# Patient Record
Sex: Female | Born: 1945 | Race: Black or African American | Hispanic: No | Smoking: Never smoker
Health system: Southern US, Community
[De-identification: ages and names within clinical notes are randomized; demographics above are authoritative.]

## PROBLEM LIST (undated history)

## (undated) DIAGNOSIS — I080 Rheumatic disorders of both mitral and aortic valves: Secondary | ICD-10-CM

## (undated) DIAGNOSIS — N186 End stage renal disease: Secondary | ICD-10-CM

## (undated) DIAGNOSIS — I5032 Chronic diastolic (congestive) heart failure: Secondary | ICD-10-CM

## (undated) DIAGNOSIS — I639 Cerebral infarction, unspecified: Secondary | ICD-10-CM

## (undated) DIAGNOSIS — I471 Supraventricular tachycardia: Secondary | ICD-10-CM

## (undated) DIAGNOSIS — E785 Hyperlipidemia, unspecified: Secondary | ICD-10-CM

## (undated) DIAGNOSIS — I119 Hypertensive heart disease without heart failure: Secondary | ICD-10-CM

## (undated) DIAGNOSIS — Z992 Dependence on renal dialysis: Secondary | ICD-10-CM

## (undated) DIAGNOSIS — E119 Type 2 diabetes mellitus without complications: Secondary | ICD-10-CM

## (undated) DIAGNOSIS — D132 Benign neoplasm of duodenum: Secondary | ICD-10-CM

## (undated) DIAGNOSIS — K432 Incisional hernia without obstruction or gangrene: Secondary | ICD-10-CM

## (undated) DIAGNOSIS — J9601 Acute respiratory failure with hypoxia: Secondary | ICD-10-CM

## (undated) DIAGNOSIS — J398 Other specified diseases of upper respiratory tract: Secondary | ICD-10-CM

## (undated) DIAGNOSIS — E669 Obesity, unspecified: Secondary | ICD-10-CM

## (undated) DIAGNOSIS — I2510 Atherosclerotic heart disease of native coronary artery without angina pectoris: Secondary | ICD-10-CM

## (undated) DIAGNOSIS — Z933 Colostomy status: Secondary | ICD-10-CM

## (undated) DIAGNOSIS — D649 Anemia, unspecified: Secondary | ICD-10-CM

## (undated) DIAGNOSIS — K219 Gastro-esophageal reflux disease without esophagitis: Secondary | ICD-10-CM

## (undated) DIAGNOSIS — I447 Left bundle-branch block, unspecified: Secondary | ICD-10-CM

## (undated) DIAGNOSIS — H409 Unspecified glaucoma: Secondary | ICD-10-CM

## (undated) DIAGNOSIS — C189 Malignant neoplasm of colon, unspecified: Secondary | ICD-10-CM

## (undated) DIAGNOSIS — I4719 Other supraventricular tachycardia: Secondary | ICD-10-CM

## (undated) DIAGNOSIS — R011 Cardiac murmur, unspecified: Secondary | ICD-10-CM

## (undated) DIAGNOSIS — I509 Heart failure, unspecified: Secondary | ICD-10-CM

## (undated) DIAGNOSIS — M1990 Unspecified osteoarthritis, unspecified site: Secondary | ICD-10-CM

## (undated) DIAGNOSIS — I10 Essential (primary) hypertension: Secondary | ICD-10-CM

## (undated) HISTORY — DX: Left bundle-branch block, unspecified: I44.7

## (undated) HISTORY — PX: VAGINAL HYSTERECTOMY: SUR661

## (undated) HISTORY — DX: Hyperlipidemia, unspecified: E78.5

## (undated) HISTORY — PX: CARDIAC CATHETERIZATION: SHX172

## (undated) HISTORY — DX: Other supraventricular tachycardia: I47.19

## (undated) HISTORY — DX: Chronic diastolic (congestive) heart failure: I50.32

## (undated) HISTORY — DX: Cerebral infarction, unspecified: I63.9

## (undated) HISTORY — DX: Acute respiratory failure with hypoxia: J96.01

## (undated) HISTORY — PX: CATARACT EXTRACTION W/ INTRAOCULAR LENS  IMPLANT, BILATERAL: SHX1307

## (undated) HISTORY — DX: Atherosclerotic heart disease of native coronary artery without angina pectoris: I25.10

## (undated) HISTORY — PX: EYE SURGERY: SHX253

## (undated) HISTORY — PX: COLONOSCOPY: SHX174

## (undated) HISTORY — PX: COLON SURGERY: SHX602

## (undated) HISTORY — DX: Anemia, unspecified: D64.9

## (undated) HISTORY — DX: Rheumatic disorders of both mitral and aortic valves: I08.0

## (undated) HISTORY — PX: REVISION UROSTOMY CUTANEOUS: SUR1282

## (undated) HISTORY — DX: Benign neoplasm of duodenum: D13.2

## (undated) HISTORY — DX: Heart failure, unspecified: I50.9

## (undated) HISTORY — DX: Supraventricular tachycardia: I47.1

---

## 1984-01-10 DIAGNOSIS — C189 Malignant neoplasm of colon, unspecified: Secondary | ICD-10-CM

## 1984-01-10 HISTORY — DX: Malignant neoplasm of colon, unspecified: C18.9

## 1984-01-10 HISTORY — PX: COLOSTOMY: SHX63

## 2003-12-11 ENCOUNTER — Ambulatory Visit: Payer: Self-pay | Admitting: Internal Medicine

## 2003-12-18 ENCOUNTER — Ambulatory Visit: Payer: Self-pay | Admitting: Internal Medicine

## 2004-01-06 ENCOUNTER — Encounter: Admission: RE | Admit: 2004-01-06 | Discharge: 2004-01-06 | Payer: Self-pay | Admitting: Nephrology

## 2004-02-01 ENCOUNTER — Encounter (HOSPITAL_COMMUNITY): Admission: RE | Admit: 2004-02-01 | Discharge: 2004-02-04 | Payer: Self-pay | Admitting: Nephrology

## 2004-02-24 ENCOUNTER — Ambulatory Visit: Payer: Self-pay | Admitting: Internal Medicine

## 2004-02-29 ENCOUNTER — Encounter (HOSPITAL_COMMUNITY): Admission: RE | Admit: 2004-02-29 | Discharge: 2004-05-29 | Payer: Self-pay | Admitting: Nephrology

## 2004-03-02 ENCOUNTER — Ambulatory Visit: Payer: Self-pay | Admitting: Internal Medicine

## 2004-03-11 ENCOUNTER — Ambulatory Visit: Payer: Self-pay | Admitting: Internal Medicine

## 2004-03-18 ENCOUNTER — Ambulatory Visit: Payer: Self-pay | Admitting: Internal Medicine

## 2004-03-18 HISTORY — PX: ESOPHAGOGASTRODUODENOSCOPY: SHX1529

## 2004-04-08 ENCOUNTER — Ambulatory Visit: Payer: Self-pay | Admitting: Internal Medicine

## 2004-04-18 ENCOUNTER — Ambulatory Visit: Payer: Self-pay | Admitting: Cardiology

## 2004-05-30 ENCOUNTER — Encounter (HOSPITAL_COMMUNITY): Admission: RE | Admit: 2004-05-30 | Discharge: 2004-08-28 | Payer: Self-pay | Admitting: Nephrology

## 2004-06-14 ENCOUNTER — Ambulatory Visit: Payer: Self-pay | Admitting: Internal Medicine

## 2005-04-19 ENCOUNTER — Ambulatory Visit: Payer: Self-pay | Admitting: Cardiology

## 2005-04-26 ENCOUNTER — Ambulatory Visit: Payer: Self-pay | Admitting: Internal Medicine

## 2005-05-23 ENCOUNTER — Ambulatory Visit: Payer: Self-pay | Admitting: Endocrinology

## 2005-05-30 ENCOUNTER — Encounter: Admission: RE | Admit: 2005-05-30 | Discharge: 2005-05-30 | Payer: Self-pay | Admitting: Endocrinology

## 2005-06-23 ENCOUNTER — Ambulatory Visit: Payer: Self-pay | Admitting: Endocrinology

## 2005-07-25 ENCOUNTER — Ambulatory Visit: Payer: Self-pay | Admitting: Endocrinology

## 2005-09-27 ENCOUNTER — Ambulatory Visit: Payer: Self-pay | Admitting: Endocrinology

## 2005-10-31 ENCOUNTER — Ambulatory Visit: Payer: Self-pay | Admitting: Internal Medicine

## 2006-02-14 ENCOUNTER — Ambulatory Visit: Payer: Self-pay | Admitting: Endocrinology

## 2006-03-13 ENCOUNTER — Ambulatory Visit: Payer: Self-pay | Admitting: Internal Medicine

## 2006-04-27 ENCOUNTER — Ambulatory Visit: Payer: Self-pay | Admitting: Cardiology

## 2006-04-27 HISTORY — PX: ELECTROCARDIOGRAM: SHX264

## 2006-05-18 ENCOUNTER — Ambulatory Visit: Payer: Self-pay | Admitting: Endocrinology

## 2006-05-31 ENCOUNTER — Ambulatory Visit: Payer: Self-pay | Admitting: Endocrinology

## 2006-06-11 ENCOUNTER — Encounter: Admission: RE | Admit: 2006-06-11 | Discharge: 2006-06-11 | Payer: Self-pay | Admitting: Nephrology

## 2006-06-15 ENCOUNTER — Ambulatory Visit: Payer: Self-pay | Admitting: Internal Medicine

## 2006-06-20 ENCOUNTER — Encounter: Admission: RE | Admit: 2006-06-20 | Discharge: 2006-06-20 | Payer: Self-pay | Admitting: Unknown Physician Specialty

## 2006-06-23 ENCOUNTER — Encounter: Admission: RE | Admit: 2006-06-23 | Discharge: 2006-06-23 | Payer: Self-pay | Admitting: Unknown Physician Specialty

## 2006-06-28 ENCOUNTER — Encounter (HOSPITAL_COMMUNITY): Admission: RE | Admit: 2006-06-28 | Discharge: 2006-09-26 | Payer: Self-pay | Admitting: Nephrology

## 2006-08-04 ENCOUNTER — Encounter: Payer: Self-pay | Admitting: Internal Medicine

## 2006-08-04 DIAGNOSIS — I119 Hypertensive heart disease without heart failure: Secondary | ICD-10-CM | POA: Insufficient documentation

## 2006-08-04 DIAGNOSIS — Z85038 Personal history of other malignant neoplasm of large intestine: Secondary | ICD-10-CM | POA: Insufficient documentation

## 2006-08-15 ENCOUNTER — Ambulatory Visit: Payer: Self-pay | Admitting: Endocrinology

## 2006-09-28 ENCOUNTER — Encounter (HOSPITAL_COMMUNITY): Admission: RE | Admit: 2006-09-28 | Discharge: 2006-12-27 | Payer: Self-pay | Admitting: Nephrology

## 2006-11-07 ENCOUNTER — Ambulatory Visit: Payer: Self-pay | Admitting: Endocrinology

## 2006-12-27 ENCOUNTER — Encounter: Payer: Self-pay | Admitting: Internal Medicine

## 2007-01-14 ENCOUNTER — Telehealth (INDEPENDENT_AMBULATORY_CARE_PROVIDER_SITE_OTHER): Payer: Self-pay | Admitting: *Deleted

## 2007-01-16 ENCOUNTER — Ambulatory Visit: Payer: Self-pay | Admitting: Internal Medicine

## 2007-01-21 ENCOUNTER — Encounter: Payer: Self-pay | Admitting: Internal Medicine

## 2007-01-24 ENCOUNTER — Encounter (HOSPITAL_COMMUNITY): Admission: RE | Admit: 2007-01-24 | Discharge: 2007-04-24 | Payer: Self-pay | Admitting: Nephrology

## 2007-01-30 ENCOUNTER — Encounter: Payer: Self-pay | Admitting: Internal Medicine

## 2007-02-19 ENCOUNTER — Encounter: Payer: Self-pay | Admitting: Internal Medicine

## 2007-02-26 ENCOUNTER — Ambulatory Visit: Payer: Self-pay | Admitting: Endocrinology

## 2007-03-06 ENCOUNTER — Encounter: Payer: Self-pay | Admitting: Internal Medicine

## 2007-04-02 ENCOUNTER — Encounter: Payer: Self-pay | Admitting: Internal Medicine

## 2007-04-08 ENCOUNTER — Encounter: Payer: Self-pay | Admitting: Internal Medicine

## 2007-04-29 ENCOUNTER — Encounter: Payer: Self-pay | Admitting: Internal Medicine

## 2007-04-30 ENCOUNTER — Encounter (HOSPITAL_COMMUNITY): Admission: RE | Admit: 2007-04-30 | Discharge: 2007-07-29 | Payer: Self-pay | Admitting: Nephrology

## 2007-05-09 ENCOUNTER — Ambulatory Visit: Payer: Self-pay | Admitting: Cardiology

## 2007-05-27 ENCOUNTER — Ambulatory Visit: Payer: Self-pay | Admitting: Cardiology

## 2007-05-27 ENCOUNTER — Encounter: Payer: Self-pay | Admitting: Cardiology

## 2007-05-27 LAB — CONVERTED CEMR LAB
Bilirubin, Direct: 0.1 mg/dL (ref 0.0–0.3)
Calcium: 8 mg/dL — ABNORMAL LOW (ref 8.4–10.5)
Creatinine, Ser: 4.1 mg/dL — ABNORMAL HIGH (ref 0.4–1.2)
GFR calc Af Amer: 14 mL/min
Glucose, Bld: 139 mg/dL — ABNORMAL HIGH (ref 70–99)
HDL: 25.1 mg/dL — ABNORMAL LOW (ref >39.0)
Sodium: 143 meq/L (ref 135–145)
Total Bilirubin: 0.5 mg/dL (ref 0.3–1.2)
Total CHOL/HDL Ratio: 7.1
Total Protein: 7.2 g/dL (ref 6.0–8.3)
Triglycerides: 141 mg/dL (ref 0–149)
VLDL: 28 mg/dL (ref 0–40)

## 2007-06-05 ENCOUNTER — Ambulatory Visit: Payer: Self-pay | Admitting: Endocrinology

## 2007-06-05 LAB — CONVERTED CEMR LAB: Hgb A1c MFr Bld: 8 % — ABNORMAL HIGH (ref 4.6–6.0)

## 2007-06-12 ENCOUNTER — Encounter: Payer: Self-pay | Admitting: Endocrinology

## 2007-06-12 ENCOUNTER — Encounter: Payer: Self-pay | Admitting: Internal Medicine

## 2007-06-13 ENCOUNTER — Ambulatory Visit: Payer: Self-pay | Admitting: Cardiology

## 2007-07-01 ENCOUNTER — Encounter: Payer: Self-pay | Admitting: Internal Medicine

## 2007-07-18 ENCOUNTER — Encounter: Payer: Self-pay | Admitting: Internal Medicine

## 2007-08-27 ENCOUNTER — Encounter (HOSPITAL_COMMUNITY): Admission: RE | Admit: 2007-08-27 | Discharge: 2007-11-25 | Payer: Self-pay | Admitting: Nephrology

## 2007-09-03 ENCOUNTER — Ambulatory Visit: Payer: Self-pay | Admitting: Endocrinology

## 2007-10-24 ENCOUNTER — Encounter: Payer: Self-pay | Admitting: Internal Medicine

## 2007-11-08 ENCOUNTER — Encounter: Payer: Self-pay | Admitting: Endocrinology

## 2007-11-27 ENCOUNTER — Encounter (HOSPITAL_COMMUNITY): Admission: RE | Admit: 2007-11-27 | Discharge: 2008-01-09 | Payer: Self-pay | Admitting: Nephrology

## 2007-11-27 ENCOUNTER — Encounter: Payer: Self-pay | Admitting: Endocrinology

## 2007-12-03 ENCOUNTER — Ambulatory Visit: Payer: Self-pay | Admitting: Vascular Surgery

## 2007-12-10 ENCOUNTER — Ambulatory Visit: Payer: Self-pay | Admitting: Endocrinology

## 2007-12-10 LAB — CONVERTED CEMR LAB: Hgb A1c MFr Bld: 7.5 % — ABNORMAL HIGH (ref 4.6–6.0)

## 2008-01-09 ENCOUNTER — Ambulatory Visit: Payer: Self-pay | Admitting: Vascular Surgery

## 2008-01-10 HISTORY — PX: ARTERIOVENOUS GRAFT PLACEMENT: SUR1029

## 2008-01-22 ENCOUNTER — Encounter (HOSPITAL_COMMUNITY): Admission: RE | Admit: 2008-01-22 | Discharge: 2008-04-21 | Payer: Self-pay | Admitting: Nephrology

## 2008-03-11 ENCOUNTER — Ambulatory Visit: Payer: Self-pay | Admitting: Vascular Surgery

## 2008-03-11 ENCOUNTER — Ambulatory Visit (HOSPITAL_COMMUNITY): Admission: RE | Admit: 2008-03-11 | Discharge: 2008-03-11 | Payer: Self-pay | Admitting: Vascular Surgery

## 2008-04-01 ENCOUNTER — Encounter: Payer: Self-pay | Admitting: Endocrinology

## 2008-04-08 ENCOUNTER — Ambulatory Visit: Payer: Self-pay | Admitting: Internal Medicine

## 2008-04-09 ENCOUNTER — Ambulatory Visit: Payer: Self-pay | Admitting: Vascular Surgery

## 2008-04-09 ENCOUNTER — Inpatient Hospital Stay (HOSPITAL_COMMUNITY): Admission: EM | Admit: 2008-04-09 | Discharge: 2008-04-10 | Payer: Self-pay | Admitting: Emergency Medicine

## 2008-04-09 ENCOUNTER — Encounter (INDEPENDENT_AMBULATORY_CARE_PROVIDER_SITE_OTHER): Payer: Self-pay | Admitting: Internal Medicine

## 2008-04-09 ENCOUNTER — Ambulatory Visit: Payer: Self-pay | Admitting: Internal Medicine

## 2008-04-10 DIAGNOSIS — E785 Hyperlipidemia, unspecified: Secondary | ICD-10-CM | POA: Insufficient documentation

## 2008-04-10 DIAGNOSIS — I447 Left bundle-branch block, unspecified: Secondary | ICD-10-CM

## 2008-04-14 ENCOUNTER — Encounter: Payer: Self-pay | Admitting: Cardiology

## 2008-04-14 ENCOUNTER — Ambulatory Visit: Payer: Self-pay | Admitting: Cardiology

## 2008-04-16 ENCOUNTER — Ambulatory Visit: Payer: Self-pay | Admitting: Endocrinology

## 2008-04-24 ENCOUNTER — Encounter (HOSPITAL_COMMUNITY): Admission: RE | Admit: 2008-04-24 | Discharge: 2008-07-23 | Payer: Self-pay | Admitting: Nephrology

## 2008-05-13 ENCOUNTER — Encounter: Payer: Self-pay | Admitting: Internal Medicine

## 2008-05-14 ENCOUNTER — Ambulatory Visit: Payer: Self-pay | Admitting: Endocrinology

## 2008-05-18 ENCOUNTER — Encounter: Payer: Self-pay | Admitting: Internal Medicine

## 2008-07-03 ENCOUNTER — Encounter: Payer: Self-pay | Admitting: Endocrinology

## 2008-07-16 ENCOUNTER — Ambulatory Visit: Payer: Self-pay | Admitting: Internal Medicine

## 2008-07-16 DIAGNOSIS — N19 Unspecified kidney failure: Secondary | ICD-10-CM | POA: Insufficient documentation

## 2008-07-16 DIAGNOSIS — R609 Edema, unspecified: Secondary | ICD-10-CM

## 2008-07-16 DIAGNOSIS — R05 Cough: Secondary | ICD-10-CM

## 2008-07-23 ENCOUNTER — Encounter: Payer: Self-pay | Admitting: Internal Medicine

## 2008-07-23 ENCOUNTER — Encounter (HOSPITAL_COMMUNITY): Admission: RE | Admit: 2008-07-23 | Discharge: 2008-10-21 | Payer: Self-pay | Admitting: Nephrology

## 2008-08-10 ENCOUNTER — Encounter: Payer: Self-pay | Admitting: Internal Medicine

## 2008-08-10 ENCOUNTER — Encounter: Payer: Self-pay | Admitting: Endocrinology

## 2008-09-16 ENCOUNTER — Encounter: Payer: Self-pay | Admitting: Internal Medicine

## 2008-09-23 ENCOUNTER — Encounter: Payer: Self-pay | Admitting: Endocrinology

## 2008-09-23 ENCOUNTER — Encounter: Payer: Self-pay | Admitting: Internal Medicine

## 2008-10-14 ENCOUNTER — Encounter: Payer: Self-pay | Admitting: Internal Medicine

## 2008-10-19 ENCOUNTER — Telehealth: Payer: Self-pay | Admitting: Endocrinology

## 2008-10-20 ENCOUNTER — Encounter: Payer: Self-pay | Admitting: Internal Medicine

## 2008-10-22 ENCOUNTER — Ambulatory Visit: Payer: Self-pay | Admitting: Endocrinology

## 2008-10-23 LAB — CONVERTED CEMR LAB: Hgb A1c MFr Bld: 6.2 % (ref 4.6–6.5)

## 2008-10-28 ENCOUNTER — Encounter: Payer: Self-pay | Admitting: Internal Medicine

## 2008-10-28 ENCOUNTER — Inpatient Hospital Stay (HOSPITAL_COMMUNITY): Admission: EM | Admit: 2008-10-28 | Discharge: 2008-10-30 | Payer: Self-pay | Admitting: Emergency Medicine

## 2008-10-28 ENCOUNTER — Encounter: Payer: Self-pay | Admitting: Cardiology

## 2008-10-28 ENCOUNTER — Ambulatory Visit: Payer: Self-pay | Admitting: Cardiovascular Disease

## 2008-10-28 ENCOUNTER — Encounter (HOSPITAL_COMMUNITY): Admission: RE | Admit: 2008-10-28 | Discharge: 2009-01-26 | Payer: Self-pay | Admitting: Nephrology

## 2008-10-29 ENCOUNTER — Encounter: Payer: Self-pay | Admitting: Internal Medicine

## 2008-10-29 ENCOUNTER — Ambulatory Visit: Payer: Self-pay | Admitting: Surgery

## 2008-11-10 ENCOUNTER — Encounter (INDEPENDENT_AMBULATORY_CARE_PROVIDER_SITE_OTHER): Payer: Self-pay | Admitting: *Deleted

## 2008-11-11 ENCOUNTER — Telehealth (INDEPENDENT_AMBULATORY_CARE_PROVIDER_SITE_OTHER): Payer: Self-pay | Admitting: Physician Assistant

## 2008-11-11 ENCOUNTER — Encounter: Payer: Self-pay | Admitting: Physician Assistant

## 2008-11-11 ENCOUNTER — Ambulatory Visit: Payer: Self-pay | Admitting: Cardiology

## 2008-11-25 ENCOUNTER — Encounter: Payer: Self-pay | Admitting: Endocrinology

## 2008-11-25 LAB — CONVERTED CEMR LAB
BUN: 66 mg/dL — ABNORMAL HIGH (ref 6–23)
Calcium: 7.2 mg/dL — ABNORMAL LOW (ref 8.4–10.5)
Chloride: 108 meq/L (ref 96–112)
Creatinine, Ser: 5.2 mg/dL (ref 0.4–1.2)
GFR calc non Af Amer: 10.75 mL/min (ref >60)
Pro B Natriuretic peptide (BNP): 776 pg/mL — ABNORMAL HIGH (ref 0.0–100.0)

## 2008-12-01 ENCOUNTER — Encounter: Payer: Self-pay | Admitting: Internal Medicine

## 2008-12-02 ENCOUNTER — Ambulatory Visit: Payer: Self-pay | Admitting: Internal Medicine

## 2008-12-02 ENCOUNTER — Encounter: Payer: Self-pay | Admitting: Cardiology

## 2008-12-02 DIAGNOSIS — I498 Other specified cardiac arrhythmias: Secondary | ICD-10-CM

## 2008-12-07 ENCOUNTER — Encounter: Payer: Self-pay | Admitting: Internal Medicine

## 2008-12-09 HISTORY — PX: CARDIAC CATHETERIZATION: SHX172

## 2008-12-23 ENCOUNTER — Inpatient Hospital Stay (HOSPITAL_COMMUNITY): Admission: EM | Admit: 2008-12-23 | Discharge: 2008-12-26 | Payer: Self-pay | Admitting: Emergency Medicine

## 2008-12-23 ENCOUNTER — Ambulatory Visit: Payer: Self-pay | Admitting: Internal Medicine

## 2008-12-24 ENCOUNTER — Encounter: Payer: Self-pay | Admitting: Internal Medicine

## 2009-01-04 ENCOUNTER — Telehealth: Payer: Self-pay | Admitting: Endocrinology

## 2009-01-21 ENCOUNTER — Encounter (INDEPENDENT_AMBULATORY_CARE_PROVIDER_SITE_OTHER): Payer: Self-pay | Admitting: *Deleted

## 2009-01-27 ENCOUNTER — Ambulatory Visit: Payer: Self-pay | Admitting: Internal Medicine

## 2009-01-27 DIAGNOSIS — I5043 Acute on chronic combined systolic (congestive) and diastolic (congestive) heart failure: Secondary | ICD-10-CM | POA: Insufficient documentation

## 2009-02-04 ENCOUNTER — Ambulatory Visit: Payer: Self-pay | Admitting: Endocrinology

## 2009-03-02 ENCOUNTER — Ambulatory Visit: Payer: Self-pay | Admitting: Internal Medicine

## 2009-03-02 DIAGNOSIS — M79609 Pain in unspecified limb: Secondary | ICD-10-CM | POA: Insufficient documentation

## 2009-03-02 DIAGNOSIS — R55 Syncope and collapse: Secondary | ICD-10-CM | POA: Insufficient documentation

## 2009-05-05 ENCOUNTER — Encounter (INDEPENDENT_AMBULATORY_CARE_PROVIDER_SITE_OTHER): Payer: Self-pay | Admitting: *Deleted

## 2009-05-05 ENCOUNTER — Encounter: Payer: Self-pay | Admitting: Endocrinology

## 2009-05-05 LAB — CONVERTED CEMR LAB
Basophils Relative: 0.3 %
Chloride: 97 meq/L
Eosinophils Relative: 2.4 %
Hgb A1c MFr Bld: 9.3 %
Lymphocytes, automated: 17.6 %
Monocytes Relative: 6.5 %
Neutrophils Relative %: 70 %
Potassium: 4 meq/L
RBC: 4.21 M/uL
Sodium: 135 meq/L
WBC: 7.63 10*3/uL

## 2009-05-06 ENCOUNTER — Ambulatory Visit: Payer: Self-pay | Admitting: Endocrinology

## 2009-05-13 ENCOUNTER — Encounter (INDEPENDENT_AMBULATORY_CARE_PROVIDER_SITE_OTHER): Payer: Self-pay | Admitting: *Deleted

## 2009-05-14 ENCOUNTER — Encounter (INDEPENDENT_AMBULATORY_CARE_PROVIDER_SITE_OTHER): Payer: Self-pay | Admitting: *Deleted

## 2009-05-22 ENCOUNTER — Encounter: Payer: Self-pay | Admitting: Internal Medicine

## 2009-05-25 ENCOUNTER — Ambulatory Visit: Payer: Self-pay | Admitting: Internal Medicine

## 2009-05-29 ENCOUNTER — Encounter: Payer: Self-pay | Admitting: Internal Medicine

## 2009-05-31 ENCOUNTER — Encounter: Payer: Self-pay | Admitting: Internal Medicine

## 2009-06-02 ENCOUNTER — Encounter: Payer: Self-pay | Admitting: Endocrinology

## 2009-06-02 ENCOUNTER — Encounter (INDEPENDENT_AMBULATORY_CARE_PROVIDER_SITE_OTHER): Payer: Self-pay | Admitting: *Deleted

## 2009-06-02 LAB — CONVERTED CEMR LAB
Basophils Relative: 0.6 %
Calcium: 9.8 mg/dL
Creatinine, Ser: 7.6 mg/dL
Eosinophils Relative: 1.4 %
HCT: 34.3 %
Hemoglobin: 11.3 g/dL
Monocytes Relative: 7.3 %
Sodium: 136 meq/L
WBC: 8.23 10*3/uL

## 2009-06-08 ENCOUNTER — Encounter (INDEPENDENT_AMBULATORY_CARE_PROVIDER_SITE_OTHER): Payer: Self-pay | Admitting: *Deleted

## 2009-07-15 ENCOUNTER — Encounter: Payer: Self-pay | Admitting: Internal Medicine

## 2009-07-20 ENCOUNTER — Ambulatory Visit: Payer: Self-pay | Admitting: Internal Medicine

## 2009-07-20 DIAGNOSIS — K9413 Enterostomy malfunction: Secondary | ICD-10-CM

## 2009-07-20 DIAGNOSIS — K9403 Colostomy malfunction: Secondary | ICD-10-CM

## 2009-07-22 ENCOUNTER — Ambulatory Visit: Payer: Self-pay | Admitting: Endocrinology

## 2009-08-17 ENCOUNTER — Encounter: Payer: Self-pay | Admitting: Internal Medicine

## 2009-08-25 ENCOUNTER — Encounter: Payer: Self-pay | Admitting: Internal Medicine

## 2009-08-26 ENCOUNTER — Encounter: Payer: Self-pay | Admitting: Internal Medicine

## 2009-10-19 ENCOUNTER — Encounter: Payer: Self-pay | Admitting: Internal Medicine

## 2009-10-26 ENCOUNTER — Encounter: Admission: RE | Admit: 2009-10-26 | Discharge: 2009-10-26 | Payer: Self-pay | Admitting: General Surgery

## 2009-10-28 ENCOUNTER — Encounter: Admission: RE | Admit: 2009-10-28 | Discharge: 2009-10-28 | Payer: Self-pay | Admitting: General Surgery

## 2009-10-28 ENCOUNTER — Telehealth: Payer: Self-pay | Admitting: Endocrinology

## 2009-11-01 ENCOUNTER — Ambulatory Visit: Payer: Self-pay | Admitting: Endocrinology

## 2009-11-03 ENCOUNTER — Encounter: Payer: Self-pay | Admitting: Endocrinology

## 2009-11-04 ENCOUNTER — Telehealth: Payer: Self-pay | Admitting: Internal Medicine

## 2009-11-18 ENCOUNTER — Encounter: Payer: Self-pay | Admitting: Internal Medicine

## 2009-11-18 ENCOUNTER — Ambulatory Visit (HOSPITAL_COMMUNITY): Admission: RE | Admit: 2009-11-18 | Discharge: 2009-11-18 | Payer: Self-pay | Admitting: General Surgery

## 2009-11-23 ENCOUNTER — Telehealth: Payer: Self-pay | Admitting: Internal Medicine

## 2009-11-24 ENCOUNTER — Encounter: Payer: Self-pay | Admitting: Internal Medicine

## 2009-11-26 ENCOUNTER — Encounter (INDEPENDENT_AMBULATORY_CARE_PROVIDER_SITE_OTHER): Payer: Self-pay | Admitting: *Deleted

## 2009-11-26 ENCOUNTER — Ambulatory Visit: Payer: Self-pay | Admitting: Internal Medicine

## 2009-11-30 ENCOUNTER — Encounter: Payer: Self-pay | Admitting: Endocrinology

## 2009-12-07 ENCOUNTER — Ambulatory Visit: Payer: Self-pay | Admitting: Internal Medicine

## 2009-12-09 ENCOUNTER — Ambulatory Visit (HOSPITAL_COMMUNITY)
Admission: RE | Admit: 2009-12-09 | Discharge: 2009-12-09 | Payer: Self-pay | Source: Home / Self Care | Admitting: Internal Medicine

## 2009-12-09 ENCOUNTER — Encounter: Payer: Self-pay | Admitting: Internal Medicine

## 2009-12-14 ENCOUNTER — Telehealth: Payer: Self-pay | Admitting: Internal Medicine

## 2009-12-15 ENCOUNTER — Encounter: Payer: Self-pay | Admitting: Internal Medicine

## 2009-12-15 ENCOUNTER — Telehealth: Payer: Self-pay | Admitting: Internal Medicine

## 2010-01-03 ENCOUNTER — Observation Stay (HOSPITAL_COMMUNITY)
Admission: EM | Admit: 2010-01-03 | Discharge: 2010-01-05 | Payer: Self-pay | Source: Home / Self Care | Attending: Internal Medicine | Admitting: Internal Medicine

## 2010-01-08 ENCOUNTER — Inpatient Hospital Stay (HOSPITAL_COMMUNITY)
Admission: EM | Admit: 2010-01-08 | Discharge: 2010-01-13 | Payer: Self-pay | Source: Home / Self Care | Attending: Internal Medicine | Admitting: Internal Medicine

## 2010-01-11 ENCOUNTER — Other Ambulatory Visit: Payer: Self-pay | Admitting: Internal Medicine

## 2010-01-11 ENCOUNTER — Encounter (INDEPENDENT_AMBULATORY_CARE_PROVIDER_SITE_OTHER): Payer: Self-pay | Admitting: Internal Medicine

## 2010-01-12 LAB — RENAL FUNCTION PANEL
Albumin: 3 g/dL — ABNORMAL LOW (ref 3.5–5.2)
BUN: 57 mg/dL — ABNORMAL HIGH (ref 6–23)
CO2: 25 mEq/L (ref 19–32)
Calcium: 8.8 mg/dL (ref 8.4–10.5)
Chloride: 95 mEq/L — ABNORMAL LOW (ref 96–112)
Creatinine, Ser: 8.76 mg/dL — ABNORMAL HIGH (ref 0.4–1.2)
GFR calc Af Amer: 6 mL/min — ABNORMAL LOW (ref >60)
GFR calc non Af Amer: 5 mL/min — ABNORMAL LOW (ref >60)
Glucose, Bld: 253 mg/dL — ABNORMAL HIGH (ref 70–99)
Phosphorus: 6.3 mg/dL — ABNORMAL HIGH (ref 2.3–4.6)
Potassium: 4.1 mEq/L (ref 3.5–5.1)
Sodium: 134 mEq/L — ABNORMAL LOW (ref 135–145)

## 2010-01-12 LAB — CBC
HCT: 32.7 % — ABNORMAL LOW (ref 36.0–46.0)
Hemoglobin: 10.5 g/dL — ABNORMAL LOW (ref 12.0–15.0)
MCH: 29.1 pg (ref 26.0–34.0)
MCHC: 32.1 g/dL (ref 30.0–36.0)
MCV: 90.6 fL (ref 78.0–100.0)
Platelets: 223 10*3/uL (ref 150–400)
RBC: 3.61 MIL/uL — ABNORMAL LOW (ref 3.87–5.11)
RDW: 14.1 % (ref 11.5–15.5)
WBC: 7.6 10*3/uL (ref 4.0–10.5)

## 2010-01-12 LAB — GLUCOSE, CAPILLARY
Glucose-Capillary: 225 mg/dL — ABNORMAL HIGH (ref 70–99)
Glucose-Capillary: 117 mg/dL — ABNORMAL HIGH (ref 70–99)

## 2010-01-13 LAB — GLUCOSE, CAPILLARY
Glucose-Capillary: 239 mg/dL — ABNORMAL HIGH (ref 70–99)
Glucose-Capillary: 212 mg/dL — ABNORMAL HIGH (ref 70–99)

## 2010-01-14 LAB — GLUCOSE, CAPILLARY: Glucose-Capillary: 154 mg/dL — ABNORMAL HIGH (ref 70–99)

## 2010-01-18 ENCOUNTER — Telehealth: Payer: Self-pay | Admitting: Internal Medicine

## 2010-01-20 ENCOUNTER — Telehealth: Payer: Self-pay | Admitting: Internal Medicine

## 2010-01-28 NOTE — Discharge Summary (Signed)
[name], [name] NO.:  [id number]  MEDICAL RECORD NO.:  [id number]          PATIENT TYPE:  INP  LOCATION:  3007                         FACILITY:  [facility]  PHYSICIAN:  Pincus Large, MD     DATE OF BIRTH:  [birth year]  DATE OF ADMISSION:  01/08/2010 DATE OF DISCHARGE:  01/13/2010                              DISCHARGE SUMMARY   PRIMARY CARE PHYSICIAN:  Georgina Quint. Plotnikov, MD  PRIMARY CARDIOLOGIST:  Jesse Sans. Daleen Squibb, MD, [facility] and Duke Salvia, MD, [facility]  NEPHROLOGIST:  Cecille Aver, MD  ENDOCRINOLOGIST:  Cleophas Dunker Everardo All, MD  REASON FOR ADMISSION:  Ataxia and generalized weakness.  DISCHARGE DIAGNOSES: 1. Acute/subacute nonhemorrhagic infarct at the junction of the     posterior superior left thalamus and posterior left corona radiata. 2. End-stage renal disease and hemodialysis. 3. History of ischemic cardiomyopathy. 4. Diabetes type 2. 5. History of atrial tachycardia. 6. History of hyperparathyroidism. 7. History of dyslipidemia. 8. History of colon cancer in 1986.  MEDICATIONS ON DISCHARGE: 1. Plavix 75 mg daily. 2. Imdur 60 mg daily. 3. Simvastatin 10 mg daily. 4. Amiodarone 200 mg daily. 5. Aspirin 325 mg daily. 6. Diltiazem CD 240 mg daily. 7. Humalog Mix 50/50 20 units b.i.d. 8. Metoprolol 50 mg p.o. b.i.d. 9. PhosLo 2 cap b.i.d.  HOSPITAL COURSE:  A 65 year old African American female with past medical history of ischemic cardiomyopathy, EF 25%, coronary artery disease, end-stage renal disease on hemodialysis on Monday, Wednesday, Friday who is recently treated for UTI and chronic anemia, and she was discharged home.  The patient came to the hospital because she had some generalized weakness and ataxia.  She had CAT of her head, which shows may be she has a focal hypodensity in the left periventricular white matter compatible with age, intermittent lacunar infarct and MRI was suggestive of further characterization of  __________.  The patient says apart from the generalized weakness and ataxia gait, she has no other symptoms.  She denies any headache.  She denies any diplopia.  She denies any shortness of breath or dysuria or frequency.  The patient was admitted for acute CVA and MRI and MRA was done.  On January 11, 2010, MRI of the head shows small acute/subacute nonhemorrhagic infarct at the junction of the posterior superior left thalamus and posterior left corona radiata and MRA was done, which shows moderate stenosis and the supraclinoid aspect of the left internal carotid artery and mild stenosis of the supraclinoid aspect in the right internal carotid artery.  Moderate narrowing of the distal A1 segment right anterior cerebral artery and moderate __________ cerebral artery branch vessel. The patient was seen by stroke team and Plavix was added.  The patient also had PT and OT, and they recommend subacute nursing home, but the patient is refusing to go to nursing home, she wants to go home. Several times to convince the patient and that she should to go to a nursing home, but she is keep refusing.  She wants to go home.  A case manager consult was done and they arranged for the patient go home with  home health.  Tests that was done during the hospital course: A 2-D echo was done, which shows left ventricular EF was 55-60%, mitral valve with calcified annulus.  Left atrium was mildly dilatated.  Atrial septum:  No defect or patent foramen ovale.  CT head without contrast was done, which shows focal hypodensity in the left periventricular white matter compatible with age lacunar infarct and MRI of the brain was done, which shows permanent intracranial type changes and also choose small acute/subacute nonhemorrhagic infarct junction of the posterior superior left thalamus and the posterior left corona radiata.  LAB ON DISCHARGE:  WBC 7.6, hemoglobin 10.5, and platelet is 223. Sodium is 134,  potassium 4.1, chloride 95, bicarb is 25, glucose 253, BUN 57, and creatinine is 8.6 with her phosphorus 6.3.  VITAL SIGNS ON DISCHARGE:  Temperature is 98.5, pulse is 96, respirations is 20, blood pressure 115/63.  DISPOSITION:  Home with home PT.  DIET:  Renal diet.          ______________________________ Pincus Large, MD     SA/MEDQ  D:  01/13/2010  T:  01/14/2010  Job:  [id number]  cc:   Georgina Quint. Plotnikov, MD  Electronically Signed by Pincus Large MD on 01/28/2010 11:48:14 AM

## 2010-01-30 ENCOUNTER — Encounter: Payer: Self-pay | Admitting: Internal Medicine

## 2010-01-30 ENCOUNTER — Encounter: Payer: Self-pay | Admitting: Unknown Physician Specialty

## 2010-02-02 ENCOUNTER — Encounter: Payer: Self-pay | Admitting: Internal Medicine

## 2010-02-08 NOTE — Progress Notes (Signed)
Summary: Insulin RF  Phone Note Call from Patient Call back at Home Phone [phone]   Caller: Patient Call For: Dr Everardo All Summary of Call: Pt is completely out of insulin, Novlon 70/30. [facility] [employer] [phone]). Pt made appt for 10/24 w/Dr Everardo All. Please advise. Initial call taken by: Verdell Face,  October 28, 2009 10:01 AM  Follow-up for Phone Call        Novolin has been changed to Humalog.  Will send Rf of Humalog mix 50/50.  pt informed  Follow-up by: Lanier Prude, [facility]),  October 28, 2009 10:44 AM    Prescriptions: HUMALOG MIX 50/50 50-50 % SUSP (INSULIN LISPRO PROT & LISPRO) 20 units each am  #1 mo supply x 1   Entered by:   Lanier Prude, CMA(AAMA)   Authorized by:   Minus Breeding MD   Signed by:   Lanier Prude, [facility]) on 10/28/2009   Method used:   Electronically to        [facility] [id number] (retail)       [address] [place], [place]  [address]       Ph: [phone]       Fax: [phone]   RxID:   [id number]

## 2010-02-08 NOTE — Medication Information (Signed)
Summary: Glucose Testing Supplies/[facility]  Glucose Testing Supplies/[facility]   Imported By: Sherian Rein 05/25/2009 14:05:25  _____________________________________________________________________  External Attachment:    Type:   Image     Comment:   External Document

## 2010-02-08 NOTE — Medication Information (Signed)
Summary: Glucose Testing Supplies/[facility]  Glucose Testing Supplies/[facility]   Imported By: Sherian Rein 06/02/2009 11:52:35  _____________________________________________________________________  External Attachment:    Type:   Image     Comment:   External Document

## 2010-02-08 NOTE — Assessment & Plan Note (Signed)
Summary: 8wk f/u ok per melanie for pt to wait until 10 weeks.  Medications Added METOPROLOL TARTRATE 100 MG TABS (METOPROLOL TARTRATE) Take 1/2 tablet by mouth twice daily NOVOLOG MIX 70/30 70-30 %  SUSP (INSULIN ASPART PROT & ASPART) 20 units every morning CALCIUM ACETATE 667 MG CAPS (CALCIUM ACETATE (PHOS BINDER)) take two capsules in the middle of each meal each day AMIODARONE HCL 200 MG TABS (AMIODARONE HCL) Take 1 tablet by mouth once a day      Allergies Added:   Primary Provider:  dr. Posey Rea  CC:  8 week follow up. Pt states she is feeling well.  [name]  History of Present Illness: [name] is seen  in follow  up  for   an atrial tachycardia that was responsive to amiodarone.  that time she had an echo that demonstratedsevere left ventricular dysfunction at about 25%.  she developed  recurrent tachycardia in November note is actually hospitalized in December because of hypoglycemia shortness of breath and was found to have a non-STEMI. She underwent catheterization that demonstrated 60% mid RCA subtotal occlusion of the posterior descending 70 AV circ  disease and a 30% proximal LAD. She also had distal disease in her diagonal and her LAD ejection fraction however had improved to 45%.  she has continued to tolerate her amiodarone without symptoms of nausea balance problems. She denies significant chest pain shortness of breath or peripheral edema. Her dialysis days are associated with profound fatigue and weakness       Current Medications (verified): 1)  Metoprolol Tartrate 100 Mg Tabs (Metoprolol Tartrate) .... Take 1 Tablet Two Times A Day 2)  Aspirin 325 Mg  Tbec (Aspirin) .... Take 1 By Mouth Qd 3)  Novolog Mix 70/30 70-30 %  Susp (Insulin Aspart Prot & Aspart) .... 20 Units Every Morning 4)  Isosorbide Mononitrate Cr 60 Mg Xr24h-Tab (Isosorbide Mononitrate) .... Take One Daily 5)  Calcium Acetate 667 Mg Caps (Calcium Acetate (Phos Binder)) .... Take Two Capsules in The  Middle of Each Meal Each Day 6)  Amiodarone Hcl 200 Mg Tabs (Amiodarone Hcl) .... 2 By Mouth Q8hours 7)  Simvastatin 10 Mg Tabs (Simvastatin) .[name].. 1 By Mouth Daily  Allergies (verified): 1)  ! * Ivp Dye 2)  ! 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  The right heart catheterization showed elevated left   and right heart filling pressures with pulmonary artery pressure   elevated mildly out of proportion to the wedge.  The left heart   catheterization showed diffuse distal vessel disease as well as a 75%   stenosis in the mid circumflex with a 90% stenosis of the ostial first   obtuse marginal.  These lesions were in close proximity.  There was a 60-   70% mid RCA stenosis.  There was subtotal occlusion of the PDA, however,   this appears to have been a small vessel and there is some supply to the   PDA territory from an acute marginal.  The circumflex was relatively   small and did not supply a large territory Renal failure  Past Surgical History: Last updated: 04/10/2008 Colostomy Urostomy  EGD (03/18/2004) EKG (04/27/2006) Placement of new left forearm arteriovenous graft.  March 11, 2008  Family History: Last updated: 01/16/2007 Family History Hypertension  Social History: Last updated: 04/10/2008 Retired Never Smoked Alcohol Use - no Drug Use - no  Vital Signs:  Patient profile:   65 year old female Height:      60 inches Weight:      240 pounds BMI:     47.04 Pulse rate:   88 / minute Pulse  rhythm:   regular BP sitting:   128 / 74  (left arm) Cuff size:   large  Vitals Entered By: Judithe Modest CMA (May 25, 2009 10:41 AM)  Physical Exam  General:  The patient was alert and oriented in no acute distress. HEENT Normal.  Neck veins were flat, carotids were brisk.  Lungs were clear.  Heart sounds were regular without murmurs or gallops.  Abdomen was soft with active bowel sounds. There is no clubbing cyanosis or edema. Skin Warm and dry    EKG  Procedure date:  05/25/2009  Findings:      sinus rhythm at 88 Intervals 0.26/0.17/0.44 axis was leftward at -39 Left bundle branch block  Impression & Recommendations:  Problem # 1:  LBBB (ICD-426.3) Her left bundle branch block and first degree AV block are unchanged from an electrocardiogram in the fall 2010.  Orders: EKG w/ Interpretation (93000)  Problem # 2:  ATRIAL TACHYCARDIA (ICD-427.89) atrial tachycardia remains well controlled. Her metoprolol tartrate has been down titrated. Her amiodarone is on a chronic stable dose. Will plan to see her in 6 months with consideration of further down titration  She does not need Coumadin at this point notwithstanding her CHADS VASC score given the absence of atrial fibrillation having been documented  Problem # 3:  CARDIOMYOPATHY, SECONDARY IMPROVING EF 45% (ICD-425.9) her tachycardia discarded myopathy has improved significantly. May be Worth subsequentlyrechecking the ejection fraction Her updated medication list for this problem includes:    Metoprolol Tartrate 100 Mg Tabs (Metoprolol tartrate) .[name]... Take 1/2 tablet by mouth twice daily    Aspirin 325 Mg Tbec (Aspirin) .[name]... Take 1 by mouth qd    Isosorbide Mononitrate Cr 60 Mg Xr24h-tab (Isosorbide mononitrate) .[name]... Take one daily    Amiodarone Hcl 200 Mg Tabs (Amiodarone hcl) .[name]... 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 Take 1/2 tablet by mouth twice daily    Aspirin 325 Mg Tbec (Aspirin) .[name]... Take 1 by mouth qd    Isosorbide Mononitrate Cr 60 Mg Xr24h-tab (Isosorbide mononitrate) .[name]... Take one daily    Amiodarone Hcl 200 Mg Tabs (Amiodarone hcl) .[name]... Take 1 tablet by mouth once a day  Problem # 5:  MYOCARDIAL INFARCTION, NSTEMI, SUBSEQUENT CARE (ICD-410.72) continue on aspirin Her updated medication list for this problem includes:    Metoprolol Tartrate 100 Mg Tabs (Metoprolol tartrate) .[name]... Take 1/2 tablet by mouth twice daily    Aspirin 325 Mg Tbec (Aspirin) .[name]... Take 1 by mouth qd    Isosorbide Mononitrate Cr 60 Mg Xr24h-tab (Isosorbide mononitrate) .[name]... Take one daily  Patient Instructions: 1)  Your physician wants you to follow-up in: 6 months with Dr Graciela Husbands.  You will receive a reminder letter in the mail two months in advance. If you don't receive a letter, please call our office  to schedule the follow-up appointment. 2)  Your physician recommends that you have lab work done at dialysis for Amiodarone monitoring (LFT,TSH dx 427.81 v58.69) Prescriptions: AMIODARONE HCL 200 MG TABS (AMIODARONE HCL) Take 1 tablet by mouth once a day  #30 x 11   Entered by:   Optometrist BSN   Authorized by:   Nathen May, MD, [facility]   Signed by:   Gypsy Balsam RN BSN on 05/25/2009   Method used:   Electronically to        [facility] [id number] (retail)       [address]       [place], [place]  [address]       Ph: [phone]       Fax: [phone]   RxID:   [id number]

## 2010-02-08 NOTE — Letter (Signed)
Summary: [facility]   Imported By: Roderic Ovens 11/08/2009 15:17:14  _____________________________________________________________________  External Attachment:    Type:   Image     Comment:   External Document

## 2010-02-08 NOTE — Miscellaneous (Signed)
Summary: colonoscopy cancelled - no iv access  Unable to obtain IV access today so colonoscopy cancelled will need to try for exam in January, with ? central IV access. Will arrange

## 2010-02-08 NOTE — Progress Notes (Signed)
Summary: Call to Dr. Donell Beers  Phone Note Outgoing Call   Summary of Call: Need to call Dr. Donell Beers: CT-colonoscopy not an option and I have major concerns about repeating a colonoscopy with what happened previously will do next week as Dr. Leonard Schwartz on vacation Iva Boop MD, [facility]  November 04, 2009 6:48 PM   Follow-up for Phone Call        Please let Dr. Donell Beers or her nurse know that Ms. Jaworowski cannot have a CT colonoscopy with a colostomy - not an option. I am extremely hesitant to repeat an optical colonoscopy with what happend last time.  Follow-up by: Iva Boop MD, Clementeen Graham,  November 09, 2009 4:48 PM  Additional Follow-up for Phone Call Additional follow up Details #1::        Cindy at Dr. Donell Beers notified.  She will pass the message. Additional Follow-up by: Francee Piccolo CMA Duncan Dull),  November 11, 2009 4:03 PM

## 2010-02-08 NOTE — Miscellaneous (Signed)
Summary: Ost[facility] Supplies/[facility]  Ost[facility] Supplies/[facility]   Imported By: Sherian Rein 08/26/2009 09:55:04  _____________________________________________________________________  External Attachment:    Type:   Image     Comment:   External Document

## 2010-02-08 NOTE — Letter (Signed)
Summary: [facility] Instructions  [facility]  [address] [place], [place] [address]   Phone: [phone]  Fax: [phone]       [name]    [birth year]    MRN: [id number]        Procedure Day /Date: Thursday 12-09-09     Arrival Time: 1:00 pm     Procedure Time: 2:00 pm     Location of Procedure:                     _x _  [facility] ( Outpatient Registration)   PREPARATION FOR COLONOSCOPY WITH MOVIPREP   Starting 5 days prior to your procedure  12-04-09 do not eat nuts, seeds, popcorn, corn, beans, peas,  salads, or any raw vegetables.  Do not take any fiber supplements (e.g. Metamucil, Citrucel, and Benefiber).  THE DAY BEFORE YOUR PROCEDURE         DATE:  12-08-09   DAY:  Wednesday  1.  Drink clear liquids the entire day-NO SOLID FOOD  2.  Do not drink anything colored red or purple.  Avoid juices with pulp.  No orange juice.  3.  Drink at least 64 oz. (8 glasses) of fluid/clear liquids during the day to prevent dehydration and help the prep work efficiently.  CLEAR LIQUIDS INCLUDE: Water Jello Ice Popsicles Tea (sugar ok, no milk/cream) Powdered fruit flavored drinks Coffee (sugar ok, no milk/cream) Gatorade Juice: apple, white grape, white cranberry  Lemonade Clear bullion, consomm, broth Carbonated beverages (any kind) Strained chicken noodle soup Hard Candy                             4.  In the morning, mix first dose of MoviPrep solution:    Empty 1 Pouch A and 1 Pouch B into the disposable container    Add lukewarm drinking water to the top line of the container. Mix to dissolve    Refrigerate (mixed solution should be used within 24 hrs)  5.  Begin drinking the prep at 5:00 p.m. The MoviPrep container is divided by 4 marks.   Every 15 minutes drink the solution down to the next mark (approximately 8 oz) until the full liter is complete.   6.  Follow completed prep with 16 oz of clear liquid of your choice (Nothing  red or purple).  Continue to drink clear liquids until bedtime.  7.  Before going to bed, mix second dose of MoviPrep solution:    Empty 1 Pouch A and 1 Pouch B into the disposable container    Add lukewarm drinking water to the top line of the container. Mix to dissolve    Refrigerate  THE DAY OF YOUR PROCEDURE      DATE:  12-09-09  DAY:  Thursday  Beginning at  9:00 a.m. (5 hours before procedure):         1. Every 15 minutes, drink the solution down to the next mark (approx 8 oz) until the full liter is complete.  2. Follow completed prep with 16 oz. of clear liquid of your choice.    3. You may drink clear liquids until  12:00 p.m. (2 HOURS BEFORE PROCEDURE).   MEDICATION INSTRUCTIONS  Unless otherwise instructed, you should take regular prescription medications with a small sip of water   as early as possible the morning of your procedure.  Diabetic patients - see separate instructions.  OTHER INSTRUCTIONS  You will need a responsible adult at least 65 years of age to accompany you and drive you home.   This person must remain in the waiting room during your procedure.  Wear loose fitting clothing that is easily removed.  Leave jewelry and other valuables at home.  However, you may wish to bring a book to read or  an iPod/MP3 player to listen to music as you wait for your procedure to start.  Remove all body piercing jewelry and leave at home.  Total time from sign-in until discharge is approximately 2-3 hours.  You should go home directly after your procedure and rest.  You can resume normal activities the  day after your procedure.  The day of your procedure you should not:   Drive   Make legal decisions   Operate machinery   Drink alcohol   Return to work  You will receive specific instructions about eating, activities and medications before you leave.    The above instructions have been reviewed and explained to me by  Sherren Kerns  RN  November 26, 2009 4:01 PM     I fully understand and can verbalize these instructions _____________________________ Date _________

## 2010-02-08 NOTE — Assessment & Plan Note (Signed)
Summary: Z6X  Medications Added AMIODARONE HCL 200 MG TABS (AMIODARONE HCL) Take 1 tablet by mouth Tuesday, Thursday, Saturday, Sunday      Allergies Added:   Visit Type:  Follow-up Referring Provider:  na Primary Provider:  Sula Soda, MD    History of Present Illness: [name] is seen  in follow  up  for   an atrial tachycardia that was responsive to amiodarone.  that time she had an echo that demonstratedsevere left ventricular dysfunction at about 25%.  she developed  recurrent tachycardia in November note is actually hospitalized in December 2010 because of hypoglycemia shortness of breath and was found to have a non-STEMI. She underwent catheterization that demonstrated 60% mid RCA subtotal occlusion of the posterior descending 70 AV circ  disease and a 30% proximal LAD. She also had distal disease in her diagonal and her LAD  ejection fraction however had improved to 45%.  she has continued to tolerate her amiodarone without symptoms of nausea balance problems. She denies significant chest pain shortness of breath or peripheral edema.    She has end-stage renal disease and is on dialysis.   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 Take 1/2 Tablet By Mouth Twice Daily 2)  Aspirin 325 Mg  Tbec (Aspirin) .... Take 1 By Mouth Qd 3)  Isosorbide Mononitrate Cr 60 Mg Xr24h-Tab (Isosorbide Mononitrate) .... Take One Daily 4)  Calcium Acetate 667 Mg Caps (Calcium Acetate (Phos Binder)) .... Take Two Capsules in The Middle of Each Meal Each Day 5)  Amiodarone Hcl 200 Mg Tabs (Amiodarone Hcl) .... Take 1 Tablet By Mouth Once A Day 6)  Simvastatin 10 Mg Tabs (Simvastatin) .[name].. 1 By Mouth Daily 7)  Humalog Mix 50/50 50-50 % Susp (Insulin Lispro Prot & Lispro) .... 25 Units Each Am 8)  Hemoglobin A1c .[name].. 250.41 9)  Moviprep 100 Gm  Solr (Peg-Kcl-Nacl-Nasulf-Na Asc-C) .... As Per Prep Instructions.  Allergies (verified): 1)  ! * Ivp Dye 2)  ! 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  The right heart catheterization showed elevated left   and right heart filling pressures with pulmonary artery pressure   elevated mildly out of proportion to the wedge.  The left heart   catheterization showed diffuse distal vessel disease as well as a 75%   stenosis  in the mid circumflex with a 90% stenosis of the ostial first   obtuse marginal.  These lesions were in close proximity.  There was a 60-   70% mid RCA stenosis.  There was subtotal occlusion of the PDA, however,   this appears to have been a small vessel and there is some supply to the   PDA territory  from an acute marginal.  The circumflex was relatively   small and did not supply a large territory Renal failure  Past Surgical History: Last updated: 04/10/2008 Colostomy Urostomy EGD (03/18/2004) EKG (04/27/2006) Placement of new left forearm arteriovenous graft.  March 11, 2008  Family History: Last updated: 07/20/2009 Family History Hypertension Family History of Colon Cancer:Sister   Social History: Last updated: 04/10/2008 Retired Never Smoked Alcohol Use - no Drug Use - no  Risk Factors: Smoking Status: never (03/02/2009)  Vital Signs:  Patient profile:   65 year old female Height:      60 inches Weight:      248.50 pounds BMI:     48.71 Pulse rate:   97 / minute BP sitting:   140 / 78  (right arm) Cuff size:   regular  Vitals Entered By: Caralee Ates CMA (December 07, 2009 4:17 PM)  Physical Exam  General:  The patient was alert and oriented in no acute distress. HEENT Normal.  Neck veins were flat, carotids were brisk.  Lungs were clear.  Heart sounds were rapid andregular with  2/6 sytolic  murmurs or gallops.  Abdomen was soft with active bowel sounds. There is no clubbing cyanosis or edema. Skin Warm and dry she walks w a cane    EKG  Procedure date:  12/07/2009  Findings:      this sinus rhythm at 97 Intervals 0.19/0.17/0.40 Left bundle branch block P-Wave morphology appears to be similar by vector to sinus rhythm  Impression & Recommendations:  Problem # 1:  ATRIAL TACHYCARDIA (ICD-427.89) the patient has an atrial tachycardia which easily controlled by amiodarone. Currently the patient is in sinus tachycardia. I don't think that this is an atrial focus but it may well be. I've asked her to check with the dialysis center for her heart rates are running. We'll also look at her thyroids and her hemoglobin to see if there is underlying problem which may be causing a secondary tachycardia.   Her updated medication list for this problem  includes:    Metoprolol Tartrate 100 Mg Tabs (Metoprolol tartrate) .[name]... Take 1/2 tablet by mouth twice daily    Aspirin 325 Mg Tbec (Aspirin) .[name]... Take 1 by mouth qd    Isosorbide Mononitrate Cr 60 Mg Xr24h-tab (Isosorbide mononitrate) .[name]... Take one daily    Amiodarone Hcl 200 Mg Tabs (Amiodarone hcl) .[name]... 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 Take 1/2 tablet by mouth twice daily    Aspirin 325 Mg Tbec (Aspirin) .[name]... Take 1 by mouth qd    Isosorbide Mononitrate Cr 60 Mg Xr24h-tab (Isosorbide mononitrate) .[name]... Take one daily    Amiodarone Hcl 200 Mg Tabs (Amiodarone hcl) .[name]... Take 1 tablet by mouth tuesday, thursday, saturday, sunday  Orders: EKG w/ Interpretation (93000)  Her updated medication list for this problem includes:    Metoprolol Tartrate  100 Mg Tabs (Metoprolol tartrate) .[name]... Take 1/2 tablet by mouth twice daily    Aspirin 325 Mg Tbec (Aspirin) .[name]... Take 1 by mouth qd    Isosorbide Mononitrate Cr 60 Mg Xr24h-tab (Isosorbide mononitrate) .[name]... Take one daily    Amiodarone Hcl 200 Mg Tabs (Amiodarone hcl) .[name]... Take 1 tablet by mouth tuesday, thursday, saturday, sunday  Problem # 3:  MYOCARDIAL INFARCTION, NSTEMI, SUBSEQUENT CARE (ICD-410.72) continue on her current medications. She's had no subsequent angina Her updated medication list for this problem includes:    Metoprolol Tartrate 100 Mg Tabs (Metoprolol tartrate) .[name]... Take 1/2 tablet by mouth twice daily    Aspirin 325 Mg Tbec (Aspirin) .[name]... Take 1 by mouth qd    Isosorbide Mononitrate Cr 60 Mg Xr24h-tab (Isosorbide mononitrate) .[name]... Take one daily  Patient Instructions: 1)  Your physician has recommended you make the following change in your  medication: Amiodarone-Take Tuesday, Thursday, Saturday, and Sunday. 2)  Your physician wants you to follow-up in:  1 Year with Dr. Graciela Husbands. You will receive a reminder letter in the mail two months in advance. If you don't receive a letter, please call our office to schedule the follow-up appointment. 3)  Your physician recommends that you have lab work at dialysis:  Hgb, TSH, LFT

## 2010-02-08 NOTE — Letter (Signed)
Summary: Ostomy Supplies/[facility]  Ostomy Supplies/[facility]   Imported By: Sherian Rein 08/23/2009 11:35:56  _____________________________________________________________________  External Attachment:    Type:   Image     Comment:   External Document

## 2010-02-08 NOTE — Letter (Signed)
Summary: CMN for Medical Supplies/[facility]  CMN for Medical Supplies/[facility]   Imported By: Sherian Rein 07/20/2009 08:05:39  _____________________________________________________________________  External Attachment:    Type:   Image     Comment:   External Document

## 2010-02-08 NOTE — Assessment & Plan Note (Signed)
Summary: FU---STC   Vital Signs:  Patient profile:   65 year old female Height:      60 inches (152.40 cm) Weight:      242.75 pounds (110.34 kg) BMI:     47.58 O2 Sat:      99 % on Room air Temp:     97.3 degrees F (36.28 degrees C) oral Pulse rate:   88 / minute BP sitting:   132 / 84  (left arm) Cuff size:   large  Vitals Entered By: Brenton Grills MA (July 22, 2009 9:28 AM)  O2 Flow:  Room air CC: F/U appt/aj Is Patient Diabetic? Yes   Referring Provider:  na Primary Provider:  Sula Soda, MD   CC:  F/U appt/aj.  History of Present Illness: pt states she feels well in general.  no cbg record, but states cbg's vary from 82-200.  it is in general higher as the day goes on.  she now has medicaid, but meds are expensive nonetheless.    Current Medications (verified): 1)  Metoprolol Tartrate 100 Mg Tabs (Metoprolol Tartrate) .... Take 1/2 Tablet By Mouth Twice Daily 2)  Aspirin 325 Mg  Tbec (Aspirin) .... Take 1 By Mouth Qd 3)  Novolog Mix 70/30 70-30 %  Susp (Insulin Aspart Prot & Aspart) .... 20 Units Every Morning 4)  Isosorbide Mononitrate Cr 60 Mg Xr24h-Tab (Isosorbide Mononitrate) .... Take One Daily 5)  Calcium Acetate 667 Mg Caps (Calcium Acetate (Phos Binder)) .... Take Two Capsules in The Middle of Each Meal Each Day 6)  Amiodarone Hcl 200 Mg Tabs (Amiodarone Hcl) .... Take 1 Tablet By Mouth Once A Day 7)  Simvastatin 10 Mg Tabs (Simvastatin) .[name].. 1 By Mouth Daily  Allergies (verified): 1)  ! * Ivp Dye 2)  ! 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  The right heart catheterization showed elevated left   and right heart filling pressures with pulmonary artery pressure   elevated mildly out of proportion to the wedge.  The left heart   catheterization showed diffuse distal vessel disease as well as a 75%   stenosis in the mid circumflex with a 90% stenosis of the ostial first   obtuse marginal.  These lesions were in close proximity.  There was a 60-   70% mid RCA stenosis.  There was subtotal occlusion of the PDA, however,   this appears to have been a small vessel and there is some supply to the   PDA territory from an acute marginal.  The circumflex was relatively   small and did not supply a large territory Renal failure  Review of Systems  The patient denies hypoglycemia.    Physical Exam  General:  morbidly obese.  no distress  Pulses:  dorsalis pedis intact bilat. Extremities:  no deformity.  no ulcer on the feet.  feet are of normal color and temp.  there is bilat onychomycosis there is trace right pedal edema and trace left pedal edema.   Neurologic:  sensation is intact to touch on the feet.    Impression & Recommendations:  Problem # 1:  DIABETES MELLITUS, TYPE II (  ICD-250.00) i don't have cbg info.  however, the best i can tell, she needs a shorter-acting insulin mixture.  Medications Added to Medication List This Visit: 1)  Humalog Mix 50/50 50-50 % Susp (Insulin lispro prot & lispro) .... 20 units each am  Other Orders: TLB-A1C / Hgb A1C (Glycohemoglobin) (83036-A1C) Est. Patient Level III ([address])  Patient Instructions: 1)  blood tests are being ordered for you today.  please call [phone] to hear your test results. 2)  pending the test results, please change insulin to humalog 50/50, 20 units each am.  here are some sample bottles. 3)  check your blood sugar 1 time a day.  vary the time of day when you check, between before the 3  meals, and at bedtime.  also check if you have symptoms of your blood sugar being too high or too low.  please keep a record of the readings and bring it to your next appointment here.  please call us sooner if you are having low blood sugar episodes. 4)  Please schedule a follow-up appointment in 6 weeks.

## 2010-02-08 NOTE — Medication Information (Signed)
Summary: Glucose Testing Supplies / [facility]  Glucose Testing Supplies / [facility]   Imported By: Lennie Odor 12/03/2009 12:05:03  _____________________________________________________________________  External Attachment:    Type:   Image     Comment:   External Document

## 2010-02-08 NOTE — Letter (Signed)
Summary: Diabetic Instructions  [facility]  [address] [place], [place] [address]   Phone: [phone]  Fax: [phone]    [name] [birth year] MRN: [id number]          _ x _   INSULIN (SHORT ACTING) MEDICATION INSTRUCTIONS (Regular, Humulog, Novolog)   The day before your procedure:   Do not take your evening dose   The day of your procedure:   Do not take your morning dose

## 2010-02-08 NOTE — Assessment & Plan Note (Signed)
Summary: 2 month rov/sl  Medications Added METOPROLOL TARTRATE 100 MG TABS (METOPROLOL TARTRATE) take 1 tablet two times a day * PERICALCITOL every thue, thurs, Sat AMIODARONE HCL 200 MG TABS (AMIODARONE HCL) 2 by mouth q8hours DILTIAZEM HCL ER BEADS 240 MG XR24H-CAP (DILTIAZEM HCL ER BEADS) 1 by mouth daily NEPHRO-VITE RX 1 MG TABS (B COMPLEX-C-FOLIC ACID) 1 by mouth daily SIMVASTATIN 10 MG TABS (SIMVASTATIN) 1 by mouth daily      Allergies Added:   Primary Provider:  dr. Posey Rea   History of Present Illness: [name] is seen  in follow  up  for   an atrial tachycardia that was responsive to amiodarone.  that time she had an echo that demonstratedsevere left ventricular dysfunction at about 25%.  she developed  recurrent tachycardia in November note is actually hospitalized in December because of hypoglycemia shortness of breath and was found to have a non-STEMI. She underwent catheterization that demonstrated 60% mid RCA subtotal occlusion of the posterior descending 70 AV circ  disease and a 30% proximal LAD. She also had distal disease in her diagonal and her LAD ejection fraction however had improved to 45%.  she was begun on hemodialysis and feels better than she has in 5 years. He continues to take amiodarone at 400 mg q.8  Her TSH was 0.815 i; her T4 was normal her transaminases were also normal      Current Medications (verified): 1)  Metoprolol Tartrate 100 Mg Tabs (Metoprolol Tartrate) .... Take 1 Tablet Two Times A Day 2)  Aspirin 325 Mg  Tbec (Aspirin) .... Take 1 By Mouth Qd 3)  Novolog Mix 70/30 70-30 %  Susp (Insulin Aspart Prot & Aspart) .... 30 Units Qam 4)  Isosorbide Mononitrate Cr 60 Mg Xr24h-Tab (Isosorbide Mononitrate) .... Take One Daily 5)  Pericalcitol .... Every Ann Held, Sat 6)  Amiodarone Hcl 200 Mg Tabs (Amiodarone Hcl) .... 2 By Mouth Q8hours 7)  Diltiazem Hcl Er Beads 240 Mg Xr24h-Cap (Diltiazem Hcl Er Beads) .[name].. 1 By Mouth  Daily 8)  Nephro-Vite Rx 1 Mg Tabs (B Complex-C-Folic Acid) .[name].. 1 By Mouth Daily 9)  Simvastatin 10 Mg Tabs (Simvastatin) .[name].. 1 By Mouth Daily  Allergies (verified): 1)  ! * Ivp Dye 2)  ! Ace Inhibitors 3)  Lisinopril (Lisinopril)  Vital Signs:  Patient profile:   65 year old female Height:      60 inches Weight:      237 pounds BMI:     46.45 Pulse rate:   73 / minute Resp:     16 per minute BP sitting:   154 / 76  (right arm)  Vitals Entered By: Marrion Coy, CNA (January 27, 2009 1:52 PM)  Physical Exam  General:  The patient was alert and oriented in no acute distress. HEENT Normal.  Neck veins were flat, carotids were brisk.  Lungs were clear but with decreased breath Heart sounds were regular with 2/6 systolic murmur at the right upper sternal border  Abdomen was soft with active bowel sounds. There is no clubbing cyanosis ; trace edema Skin Warm and dry    Impression & Recommendations:  Problem # 1:  ATRIAL TACHYCARDIA (ICD-427.89) Her atrial tachycardia appears to be well-controlled currently on her amiodarone. There have been no reports from dialysis of excess heart rates. We'll plan to decrease her amiodarone today from 4 mg q.8 to 400 mg a day. In 4 weeks' time we will decrease it to 200 mg a  day.  In 6 weeks we will need to check amiodarone surveillance laboratories again. I should note that her TSH was borderline low Her updated medication list for this problem includes:    Metoprolol Tartrate 100 Mg Tabs (Metoprolol tartrate) .[name]... Take 1 tablet two times a day    Aspirin 325 Mg Tbec (Aspirin) .[name]... Take 1 by mouth qd    Isosorbide Mononitrate Cr 60 Mg Xr24h-tab (Isosorbide mononitrate) .[name]... Take one daily    Amiodarone Hcl 200 Mg Tabs (Amiodarone hcl) .[name]... 2 by mouth q8hours    Diltiazem Hcl Er Beads 240 Mg Xr24h-cap (Diltiazem hcl er beads) .[name]... 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 This confirms our hypothesis. She continues to have symptoms of congestive heart failure with a mixed cardiomyopathy. We'll continue her on her current medications. It may be over time that we can discontinue her diltiazem. I wonder what this may be contributing to her edema. The following medications were removed from the medication list:    Furosemide 80 Mg Tabs (Furosemide) ..... One twice daily Her updated medication list for this problem includes:    Metoprolol Tartrate 100 Mg Tabs (Metoprolol tartrate) .[name]... Take 1 tablet two times a day    Aspirin 325 Mg Tbec (Aspirin) .[name]... Take 1 by mouth qd    Isosorbide Mononitrate Cr 60 Mg Xr24h-tab (Isosorbide mononitrate) .[name]... Take one daily    Amiodarone Hcl 200 Mg Tabs (Amiodarone hcl) .[name]... 2 by mouth q8hours    Diltiazem Hcl Er Beads 240 Mg Xr24h-cap (Diltiazem hcl er beads) .[name]... 1 by mouth daily  Problem # 3:  LBBB (ICD-426.3) stable as above Her updated medication list for this problem includes:    Metoprolol Tartrate 100 Mg Tabs (Metoprolol tartrate) .[name]... Take 1 tablet two times a day    Aspirin 325 Mg Tbec (Aspirin) .[name]... Take 1 by mouth qd    Isosorbide Mononitrate Cr 60 Mg Xr24h-tab (Isosorbide mononitrate) .[name]... Take one daily    Amiodarone Hcl 200 Mg Tabs (Amiodarone hcl) .[name]... 2 by mouth q8hours    Diltiazem Hcl Er Beads 240 Mg Xr24h-cap (Diltiazem hcl er beads) .[name]... 1 by mouth daily  Problem # 4:  COMBINED HEART FAILURE, CHRONIC (ICD-428.42) stable as above The following medications were removed from the medication list:    Furosemide 80 Mg Tabs (Furosemide) ..... One twice daily Her updated medication list for this problem includes:    Metoprolol Tartrate 100 Mg Tabs (Metoprolol tartrate) .[name]... Take 1 tablet two times a day    Aspirin 325 Mg Tbec (Aspirin) .[name]... Take 1 by mouth qd    Isosorbide Mononitrate Cr 60 Mg Xr24h-tab (Isosorbide mononitrate) .[name]... Take  one daily    Amiodarone Hcl 200 Mg Tabs (Amiodarone hcl) .[name]... 2 by mouth q8hours    Diltiazem Hcl Er Beads 240 Mg Xr24h-cap (Diltiazem hcl er beads) .[name]... 1 by mouth daily  Patient Instructions: 1)  Your physician has recommended you make the following change in your medication: Decrease your Amiodarone to 400mg  daily. In 4 weeks decrease to 200mg  daily.  2)  Your physician recommends that you return for lab work in: 6 WEEKS AT DIALYSIS HAVE LAB WORK: TSH, LFT'S 3)  Your physician recommends that you schedule a follow-up appointment in: 8 WEEKS

## 2010-02-08 NOTE — Letter (Signed)
Summary: Corrected/[facility]  Corrected/[facility]   Imported By: Lester Byron 08/30/2009 08:34:27  _____________________________________________________________________  External Attachment:    Type:   Image     Comment:   External Document

## 2010-02-08 NOTE — Progress Notes (Signed)
Summary: schedule colonoscopy   Phone Note Outgoing Call   Summary of Call: call patient and let her jnow it seems that optical colonoscopy makes most sense now I would like toschedule at hospital if she can do that week indications are 793.4 - abnormal GI imaging/colon strictures, also personal hx colon cancer please let Dr. 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 Francee Piccolo CMA Duncan Dull)  November 24, 2009 10:03 AM   I spoke with Mrs. Rosander and she will have colon on  12/1 at 2pm at [facility].  Previsit is scheduled for 11/18.  Appt scheduled with Brian-booking [phone].  Jade at [facility] will let Dr.Byerly know that Colonosocpy is scheduled.  Follow-up by: Francee Piccolo CMA Duncan Dull),  November 25, 2009 9:46 AM

## 2010-02-08 NOTE — Miscellaneous (Signed)
Summary: COLON-WL-previsit prep/rm  Clinical Lists Changes  Medications: Added new medication of MOVIPREP 100 GM  SOLR (PEG-KCL-NACL-NASULF-NA ASC-C) As per prep instructions. - Signed Rx of MOVIPREP 100 GM  SOLR (PEG-KCL-NACL-NASULF-NA ASC-C) As per prep instructions.;  #1 x 0;  Signed;  Entered by: Sherren Kerns RN;  Authorized by: Iva Boop MD, Clementeen Graham;  Method used: Electronically to [facility] [id number] [place], [place], [place]  [address], Ph: [phone], Fax: [phone] Observations: Added new observation of ALLERGY REV: Done (11/26/2009 15:29)    Prescriptions: MOVIPREP 100 GM  SOLR (PEG-KCL-NACL-NASULF-NA ASC-C) As per prep instructions.  #1 x 0   Entered by:   Sherren Kerns RN   Authorized by:   Iva Boop MD, [facility]   Signed by:   Sherren Kerns RN on 11/26/2009   Method used:   Electronically to        [facility] [id number] (retail)       [address] [place], [place]  [address]       Ph: [phone]       Fax: [phone]   RxID:   [id number]

## 2010-02-08 NOTE — Consult Note (Signed)
Summary: [facility]  Dr Berdine Dance Office Visit Note   Imported By: Roderic Ovens 11/08/2009 15:18:26  _____________________________________________________________________  External Attachment:    Type:   Image     Comment:   External Document

## 2010-02-08 NOTE — Letter (Signed)
Summary: Unable To Reach-Consult Scheduled  [facility]  [address] [place], [place] [address]   Phone: [phone]  Fax: [phone]    05/13/2009 MRN: [id number]    Dear Ms. Durwin Nora,   We have been unable to reach you by phone.  Please contact our office with an updated phone number.  At the recommendation of Dr.Plotnikov, we have been asked to schedule you a consult to see a Podiatrist.Please give me a call at [phone] so that i can schedule this referral for you.  If you have any question please call us.     Thank you,   Patient Care Coordinator [facility]

## 2010-02-08 NOTE — Procedures (Signed)
Summary: EGD: Esophagitis, Gastritis   EGD  Procedure date:  03/18/2004  Findings:      Findings: Esophagitis  Findings: Gastritis  Location: [facility]   Patient Name: [name], [name]. MRN:  Procedure Procedures: Panendoscopy (EGD) CPT: 43235.  Personnel: Endoscopist: Iva Boop, MD, [facility].  Exam Location: Exam performed in Outpatient Clinic. Outpatient  Patient Consent: Procedure, Alternatives, Risks and Benefits discussed, consent obtained, from patient. Consent was obtained by the RN.  Indications  Evaluation of: Anemia,  Positive fecal occult blood test  History  Current Medications: Patient is not currently taking Coumadin.  Comments: Iron-Deficiency Anemia with heme + stool and negative colonoscopy. Pre-Exam Physical: Performed Mar 18, 2004  Cardio-pulmonary exam, HEENT exam WNL. Abdominal exam abnormal. Mental status exam WNL. Abnormal PE findings include: colostomy with abdominal hernia, uerteroileostomy.  Exam Exam Info: Maximum depth of insertion Duodenum, intended Duodenum. Patient position: on left side. Gastric retroflexion performed. Images taken. ASA Classification: III.  Sedation Meds: Patient assessed and found to be appropriate for moderate (conscious) sedation. Fentanyl 25 mcg. given IV. Versed 3 mg. given IV. Cetacaine Spray 2 sprays given aerosolized.  Monitoring: BP and pulse monitoring done. Oximetry used. Supplemental O2 given  Findings Normal: Proximal Esophagus to Mid Esophagus.  - ESOPHAGEAL INFLAMMATION: suspected as a result of reflux. Severity is severe, ulcerations present.  Proximal margin 35 cm from mouth,  distal margin 40 cm. Length of inflammation: 5 cm. Edema present. [place] Classification: Grade C. ICD9: Esophagitis, Reflux: 530. 11.  - MUCOSAL ABNORMALITY: Fundus to Antrum. Erythematous mucosa. Red spots present. ICD9: Gastritis, Unspecified: 535.50.  Normal: Duodenal Bulb to Duodenal 2nd Portion.    Assessment Abnormal examination, see findings above.  Diagnoses: 530.11: Esophagitis, Reflux.  535.50: Gastritis, Unspecified.   Comments: REFLUX ESOPHAGITIS Events  Unplanned Intervention: No unplanned interventions were required.  Plans Medication(s): PPI: Esomeprazole/Nexium 40 mg QD,   Disposition: After procedure patient sent to recovery. After recovery patient sent home.  Scheduling: Clinic Visit, to Iva Boop, MD, [facility], 2-3 MONTHS   Comments: I WOULD CONSIDER REPEAT EGD TO DOCUMENT HEALING  CC:    Camille Bal, MD   Sonda Primes, MD  This report was created from the original endoscopy report, which was reviewed and signed by the above listed endoscopist.

## 2010-02-08 NOTE — Medication Information (Signed)
Summary: Glucose Testing Supplies/[facility]  Glucose Testing Supplies/[facility]   Imported By: Sherian Rein 06/03/2009 11:31:24  _____________________________________________________________________  External Attachment:    Type:   Image     Comment:   External Document

## 2010-02-08 NOTE — Assessment & Plan Note (Signed)
Summary: DISCUSS RECALL COLONOSCOPY/SP    History of Present Illness Visit Type: new patient  Primary GI MD: Stan Head MD [facility] Primary Provider: Sula Soda, MD  Requesting Provider: na Chief Complaint: Colorectal cancer screening History of Present Illness:   65 yo African-american woman with history of colon cancer years ago. Here to discuss repeat screening colonoscopy in light of problems at last colonoscopy - the colon and ostomy entrapped the colonoscope and manual reduction of herniated colon was required to withdraw the colonoscope.   No problems with ostoy function, stool once daily usu in PM no bleeding.  Rare heartburn with acidic foods.    GI Review of Systems    Reports acid reflux and  heartburn.      Denies abdominal pain, belching, bloating, chest pain, dysphagia with liquids, dysphagia with solids, loss of appetite, nausea, vomiting, vomiting blood, weight loss, and  weight gain.        Denies anal fissure, black tarry stools, change in bowel habit, constipation, diarrhea, diverticulosis, fecal incontinence, heme positive stool, hemorrhoids, irritable bowel syndrome, jaundice, light color stool, liver problems, rectal bleeding, and  rectal pain.    Clinical Reports Reviewed:  Colonoscopy:  03/02/2004:  Done  03/02/2004:  Results: Diverticulosis.   Results: Stenosis in Decending Colon Location:  [facility].   EGD:  03/18/2004:  Findings: Esophagitis  Findings: Gastritis  Location: [facility]    Current Medications (verified): 1)  Metoprolol Tartrate 100 Mg Tabs (Metoprolol Tartrate) .... Take 1/2 Tablet By Mouth Twice Daily 2)  Aspirin 325 Mg  Tbec (Aspirin) .... Take 1 By Mouth Qd 3)  Novolog Mix 70/30 70-30 %  Susp (Insulin Aspart Prot & Aspart) .... 20 Units Every Morning 4)  Isosorbide Mononitrate Cr 60 Mg Xr24h-Tab (Isosorbide Mononitrate) .... Take One Daily 5)  Calcium Acetate 667 Mg Caps (Calcium Acetate  (Phos Binder)) .... Take Two Capsules in The Middle of Each Meal Each Day 6)  Amiodarone Hcl 200 Mg Tabs (Amiodarone Hcl) .... Take 1 Tablet By Mouth Once A Day 7)  Simvastatin 10 Mg Tabs (Simvastatin) .[name].. 1 By Mouth Daily  Allergies (verified): 1)  ! * Ivp Dye 2)  ! Ace Inhibitors 3)  Lisinopril (Lisinopril)  Past History:  Past Medical History: Reviewed history from 03/02/2009 and no changes required. 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  The right heart catheterization showed elevated left   and right heart filling pressures with pulmonary artery pressure   elevated mildly out of proportion to the wedge.  The left heart   catheterization showed diffuse distal vessel disease as well as a 75%   stenosis in the mid circumflex with a 90% stenosis of the ostial first   obtuse marginal.  These lesions were in close proximity.  There was a 60-   70% mid RCA stenosis.  There was subtotal occlusion of the PDA, however,   this appears to have been a small vessel and there is some supply to the   PDA territory from an acute marginal.  The circumflex was relatively   small and did not supply a large territory Renal failure  Past Surgical History: Reviewed history from 04/10/2008 and no changes required. Colostomy Urostomy EGD (03/18/2004) EKG (  04/27/2006) Placement of new left forearm arteriovenous graft.  March 11, 2008  Family History: Family History Hypertension Family History of Colon Cancer:Sister   Social History: Reviewed history from 04/10/2008 and no changes required. Retired Never Smoked Alcohol Use -  no Drug Use - no  Review of Systems       The patient complains of allergy/sinus and heart rhythm changes.         All other ROS negative except as per HPI.   Vital Signs:  Patient profile:   65 year old female Height:      60 inches Weight:      244 pounds BMI:     47.83 BSA:     2.03 Pulse rate:   96 / minute Pulse rhythm:   regular BP sitting:   132 / 76  (left arm) Cuff size:   large  Vitals Entered By: Ok Anis CMA (July 20, 2009 2:00 PM)  Physical Exam  General:  obese, NAD Lungs:  Clear throughout to auscultation. Heart:  Regular rate and rhythm; no murmurs, rubs,  or bruits. Abdomen:  obese uereteroileostomy RLQ colostomy LLQ appliances left on some bulging around the LLQ coloostomy without clear hernia today, nontender  BS+ Extremities:  no edema   Impression & Recommendations:  Problem # 1:  SCREENING, COLON CANCER (ICD-V76.51) Assessment Unchanged at higher risk due to prior colon cancer many years ago have asked about possible CT colonoscopy but not done with colostomy  she needs repeat screening bt an reluctant to pursue optical colonoscopy again given the entrapment of the scope in 2006.  will ask surgery to evaluate her ostomy, consider barium enema as part of evaluation of the ostomy and stenosis of descending colon seen/suspected on 2006 colonoscopy  Problem # 2:  COLON CANCER, HX OF (ICD-V10.05) Assessment: Unchanged  Problem # 3:  MECHANICAL COMPLICATION OF COLOSTOMY&ENTEROSTOMY (ZOX-096.04) Assessment: Unchanged colostomy herniated and bowel entrapped colonoscope at 2006 colonoscopy colostomy seems ok now but will ask surgeon to assess.  Patient Instructions: 1)  We will call you to schedule/discuss CT colonoscopy.  If you have not heard from [place] by 7/19, please call our office. 2)  Please continue current medications.  3)  The medication list was reviewed and reconciled.  All changed / newly prescribed medications were explained.  A  complete medication list was provided to the patient / caregiver.  Appended Document: DISCUSS RECALL COLONOSCOPY/SP let patient know that CT colonoscopy is not an option I would like her to see Dr. Ovidio Kin in consult (surgeon) to assess her colostomy for hernia in lght of problems at colonoscopy in 2006 once we get a date I will create a referral letter  Appended Document: DISCUSS RECALL COLONOSCOPY/SP Records faxed to [facility]- awaiting a return call.  Appended Document: DISCUSS RECALL COLONOSCOPY/SP I have left the patient a message with the appointment details.  [facility] 09/08/09 10:30 arrrival for 10:55 appointment with Dr Ezzard Standing. Darcey Nora RN, [facility]  July 23, 2009 11:41 AM     Clinical Lists Changes  Orders: Added new Test order of [facility] ([facility]) - Signed

## 2010-02-08 NOTE — Progress Notes (Signed)
Summary: CLEAR FOR SURGERY   Phone Note From Other Clinic   Caller: DR.BYERLY [phone] Summary of Call: WANT TO KNOPW IF PT IS CLEARED FOR SURGERY Initial call taken by: Judie Grieve,  December 14, 2009 4:09 PM  Follow-up for Phone Call        lmfcb for details. Claris Gladden, RN, BSN Follow-up by: Claris Gladden RN,  December 15, 2009 9:31 AM  Additional Follow-up for Phone Call Additional follow up Details #1::        office to fax info later today. Additional Follow-up by: Claris Gladden RN,  December 15, 2009 9:43 AM    Additional Follow-up for Phone Call Additional follow up Details #2::    once we give approval, under general anesthesia-pt to have possible parastomal hernia repaired. Will await approval from MD.  Follow-up by: Claris Gladden RN,  December 15, 2009 3:23 PM   Appended Document: CLEAR FOR SURGERY faxd approval. Claris Gladden, RN, BSN

## 2010-02-08 NOTE — Letter (Signed)
Summary: Colonoscopy-Changed to Office Visit Letter  [facility]  [address] [place], [place] [address]   Phone: [phone]  Fax: [phone]      January 21, 2009 MRN: [id number]   [name] [address] [place], [place]  [address]   Dear [name],   According to our records, it is time for you to schedule a Colonoscopy. However, after reviewing your medical record, I feel that an office visit would be most appropriate to more completely evaluate you and determine your need for a repeat procedure.  Please call [phone] (option #2) at your convenience to schedule an office visit. If you have any questions, concerns, or feel that this letter is in error, we would appreciate your call.   Sincerely,  Iva Boop, M.D.  [facility] [phone]

## 2010-02-08 NOTE — Procedures (Signed)
Summary: Instructions for procedure/[place]  Instructions for procedure/[place]   Imported By: Sherian Rein 12/01/2009 08:44:28  _____________________________________________________________________  External Attachment:    Type:   Image     Comment:   External Document

## 2010-02-08 NOTE — Procedures (Signed)
Summary: Colonoscopy: Diverticulosis   Colonoscopy  Procedure date:  03/02/2004  Findings:      Results: Diverticulosis.   Results: Stenosis in Decending Colon Location:  [facility].    Procedures Next Due Date:    Colonoscopy: 03/2009  Patient Name: [name], [name]. MRN:  Procedure Procedures: Colonoscopy CPT: [phone].  Personnel: Endoscopist: Iva Boop, MD, [facility].  Referred By: Duke Salvia Eliott Nine, MD.  Exam Location: Exam performed in Outpatient Clinic. Outpatient  Patient Consent: Procedure, Alternatives, Risks and Benefits discussed, consent obtained, from patient. Consent was obtained by the RN.  Indications  Evaluation of: Anemia Positive fecal occult blood test  Surveillance of: Colorectal Cancer.  Comments: Iron Deficiency Anemia History  Current Medications: Patient is not currently taking Coumadin.  Comments: Prior remote resection of colon cancer with permanant colostomy, cystectomy and ureteroileostomy. Pre-Exam Physical: Performed Mar 02, 2004. Cardio-pulmonary exam, HEENT exam  WNL. Abdominal exam abnormal. Mental status exam WNL. Abnormal PE findings include: colostomy with abdominal hernia, uerteroileostomy.  Exam Exam: Extent of exam reached: Cecum, extent intended: Cecum.  The cecum was identified by appendiceal orifice and IC valve. Colon retroflexion performed. Images taken. ASA Classification: III. Tolerance: excellent.  Monitoring: Pulse and BP monitoring, Oximetry used. Supplemental O2 given.  Colon Prep Used Half-Lytely for colon prep. Prep results: fair, adequate exam.  Sedation Meds: Patient assessed and found to be appropriate for moderate (conscious) sedation. Fentanyl 25 mcg. given IV. Versed 3 mg. given IV.  Findings STRICTURE / STENOSIS: Stenosis in Descending Colon.  Comments: seen on withdrawal, after scope became temporarily fixed after reaching the cecum. Required massage of ostomy and hernia to allow  withdrawal of scope.  PRIOR SURGERY: Cecum. Comments: there appears to be an ileo-colonic anastamosis with intact ileocecal valve.  - DIVERTICULOSIS: Cecum to Sigmoid Colon. ICD9: Diverticulosis, Colon: 562.10. Comments: scattered diverticula.   Assessment Abnormal examination, see findings above.  Diagnoses: 562.10: Diverticulosis, Colon.   Comments: NO POLYPS OR CANCER SEEN, NOR OTHER CAUSES OF BLOOD LOSS.  Events  Unplanned Interventions: No intervention was required.  Plans Comments: IF HERNIA BECOMED PROBLEMATIC WOULD HAVE HER SEE A SURGEON Disposition: After procedure patient sent to recovery. After recovery patient sent home.  Scheduling/Referral: Clinic Visit, to Iva Boop, MD, [facility], 5 YR RECALL RE: COLON CANCER SURVEILLANCE,   Comments: NEED TO CONSIDER EGD, WILL DISCUSS WITH PATIENT. I WOULD NOT REPEAT A COLONOSCPY IN HER GIVEN THE ENTRAPMENT ISSUE, WOULD DO CT COLONOSCOPY OR OTHER IMAGING IN THE FUTURE AS FAR AS FOLLOW-UP FOR REMOTE CANCER  CC:   Camille Bal, MD   Sonda Primes, MD  This report was created from the original endoscopy report, which was reviewed and signed by the above listed endoscopist.

## 2010-02-08 NOTE — Miscellaneous (Signed)
Summary: Lab Results   Clinical Lists Changes  Observations: Added new observation of CL SERUM: 97 meq/L (05/05/2009 10:25) Added new observation of K SERUM: 4.0 meq/L (05/05/2009 10:25) Added new observation of NA: 135 meq/L (05/05/2009 10:25) Added new observation of CREATININE: 8.3 mg/dL (16/10/9602 54:09) Added new observation of ALK PHOS: 103 units/L (05/05/2009 10:24) Added new observation of ALBUMIN: 3.6 g/dL (81/19/1478 29:56) Added new observation of CALCIUM: 8.9 mg/dL (21/30/8657 84:69) Added new observation of PTH, INTACT: 183.9 pg/mL (05/05/2009 10:24) Added new observation of HGBA1C: 9.3 % (05/05/2009 10:22) Added new observation of BASOPHIL %: 0.3 % (05/05/2009 10:17) Added new observation of % EOS AUTO: 2.4 % (05/05/2009 10:17) Added new observation of MONOCYTE %: 6.5 % (05/05/2009 10:17) Added new observation of LYMPH %: 17.6 % (05/05/2009 10:17) Added new observation of PMN %: 70.0 % (05/05/2009 10:17) Added new observation of MCV: 91 fL (05/05/2009 10:17) Added new observation of PLATELETK/UL: 245 K/uL (05/05/2009 10:17) Added new observation of RBC M/UL: 4.21 M/uL (05/05/2009 10:17) Added new observation of HCT: 38.2 % (05/05/2009 10:17) Added new observation of HGB: 12.1 g/dL (62/95/2841 32:44) Added new observation of WBC COUNT: 7.63 10*3/microliter (05/05/2009 10:17)      -  Date:  05/05/2009    WBC: 7.63    HGB: 12.1    HCT: 38.2    RBC: 4.21    PLT: 245    MCV: 91    Neutrophil: 70.0    Lymphs: 17.6    Monos: 6.5    Eos: 2.4    Basophil: 0.3    HbA1c: 9.3    PTH Intact: 183.9    Calcium: 8.9    Albumin: 3.6    Alk Phos: 103    Creatinine: 8.3    Sodium: 135    Potassium: 4.0    Chloride: 97

## 2010-02-08 NOTE — Assessment & Plan Note (Signed)
Summary: 3 MTH FU STC   Vital Signs:  Patient profile:   65 year old female Height:      60 inches (152.40 cm) Weight:      235.25 pounds (106.93 kg) O2 Sat:      99 % on Room air Temp:     97.0 degrees F (36.11 degrees C) oral Pulse rate:   85 / minute BP sitting:   144 / 80  (left arm) Cuff size:   large  Vitals Entered By: Josph Macho CMA (February 04, 2009 10:59 AM)  O2 Flow:  Room air CC: 3 month follow up/ CF Is Patient Diabetic? Yes   Primary Provider:  dr. Posey Rea  CC:  3 month follow up/ CF.  History of Present Illness: pt says she still takes novolin 70/30, 30 units each am.   no cbg record, but states cbg's vary from 110-200, with no trend throughout the day.  Current Medications (verified): 1)  Metoprolol Tartrate 100 Mg Tabs (Metoprolol Tartrate) .... Take 1 Tablet Two Times A Day 2)  Aspirin 325 Mg  Tbec (Aspirin) .... Take 1 By Mouth Qd 3)  Novolog Mix 70/30 70-30 %  Susp (Insulin Aspart Prot & Aspart) .... 30 Units Qam 4)  Isosorbide Mononitrate Cr 60 Mg Xr24h-Tab (Isosorbide Mononitrate) .... Take One Daily 5)  Pericalcitol .... Every Ann Held, Sat 6)  Amiodarone Hcl 200 Mg Tabs (Amiodarone Hcl) .... 2 By Mouth Q8hours 7)  Diltiazem Hcl Er Beads 240 Mg Xr24h-Cap (Diltiazem Hcl Er Beads) .[name].. 1 By Mouth Daily 8)  Nephro-Vite Rx 1 Mg Tabs (B Complex-C-Folic Acid) .[name].. 1 By Mouth Daily 9)  Simvastatin 10 Mg Tabs (Simvastatin) .[name].. 1 By Mouth Daily  Allergies (verified): 1)  ! * Ivp Dye 2)  ! 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    Physical Exam  General:  morbidly obese.  no distress  Skin:  insulin injection sites at anterior abdomen and triceps areas are normal.    Impression & Recommendations:  Problem # 1:  DIABETES MELLITUS, TYPE II (ICD-250.00) therapy limited by noncompliance.  i'll do the best i can.  Medications Added to Medication List This Visit: 1)  Novolog Mix 70/30 70-30 % Susp (Insulin aspart prot & aspart) .[name].. 15 units qam  Other Orders: Est. Patient Level III (04540)  Patient Instructions: 1)  reduce 70/30 insulin to 15 units each am.  do not take 'sliding-scale" insulin.  as pt does not currently have coverage for meds, i gave samples of humalog 75/25 insulin. 2)  check your blood sugar 1 time a day.  vary the time of day when you check, between before the 3 meals, and at bedtime.  also check if you have symptoms of your blood sugar being too high or too low.  please keep a record of the readings and bring it to your next appointment here.  please call us sooner if you are having low blood sugar episodes. 3)  Please schedule a follow-up appointment in 3 months.

## 2010-02-08 NOTE — Miscellaneous (Signed)
Summary: Lab results   Clinical Lists Changes  Observations: Added new observation of CALCIUM: 9.8 mg/dL (16/10/9602 54:09) Added new observation of CL SERUM: 96 meq/L (06/02/2009 15:04) Added new observation of K SERUM: 3.9 meq/L (06/02/2009 15:04) Added new observation of NA: 136 meq/L (06/02/2009 15:04) Added new observation of CREATININE: 7.6 mg/dL (81/19/1478 29:56) Added new observation of BG RANDOM: 219 mg/dL (21/30/8657 84:69) Added new observation of BASOPHIL %: 0.6 % (06/02/2009 15:03) Added new observation of % EOS AUTO: 1.4 % (06/02/2009 15:03) Added new observation of MONOCYTE %: 7.3 % (06/02/2009 15:03) Added new observation of LYMPH %: 18.5 % (06/02/2009 15:03) Added new observation of PMN %: 69.4 % (06/02/2009 15:03) Added new observation of MCV: 90 fL (06/02/2009 15:03) Added new observation of PLATELETK/UL: 220 K/uL (06/02/2009 15:03) Added new observation of RBC M/UL: 3.80 M/uL (06/02/2009 15:03) Added new observation of HCT: 34.3 % (06/02/2009 15:03) Added new observation of HGB: 11.3 g/dL (62/95/2841 32:44) Added new observation of WBC COUNT: 8.23 10*3/microliter (06/02/2009 15:03)      -  Date:  06/02/2009    WBC: 8.23    HGB: 11.3    HCT: 34.3    RBC: 3.80    PLT: 220    MCV: 90    Neutrophil: 69.4    Lymphs: 18.5    Monos: 7.3    Eos: 1.4    Basophil: 0.6    BG Random: 219    Creatinine: 7.6    Sodium: 136    Potassium: 3.9    Chloride: 96    Calcium: 9.8

## 2010-02-08 NOTE — Letter (Signed)
Summary: CMN/[facility]  CMN/[facility]   Imported By: Lester Longview 11/23/2009 10:20:13  _____________________________________________________________________  External Attachment:    Type:   Image     Comment:   External Document

## 2010-02-08 NOTE — Assessment & Plan Note (Signed)
Summary: f/u appt/#/cd   Vital Signs:  Patient profile:   65 year old female Height:      60 inches (152.40 cm) Weight:      247.25 pounds (112.39 kg) BMI:     48.46 O2 Sat:      95 % on Room air Temp:     98.5 degrees F (36.94 degrees C) oral Pulse rate:   97 / minute BP sitting:   120 / 64  (right arm) Cuff size:   large  Vitals Entered By: Brenton Grills MA (November 01, 2009 3:50 PM)  O2 Flow:  Room air CC: Follow-up visit/aj Is Patient Diabetic? Yes   Referring Provider:  na Primary Provider:  Sula Soda, MD   CC:  Follow-up visit/aj.  History of Present Illness: no cbg record, but states cbg's are 130 before breakfast, and 300's after breakfast.  then it returns to the 100's.  pt states she feels well in general.  Current Medications (verified): 1)  Metoprolol Tartrate 100 Mg Tabs (Metoprolol Tartrate) .... Take 1/2 Tablet By Mouth Twice Daily 2)  Aspirin 325 Mg  Tbec (Aspirin) .... Take 1 By Mouth Qd 3)  Isosorbide Mononitrate Cr 60 Mg Xr24h-Tab (Isosorbide Mononitrate) .... Take One Daily 4)  Calcium Acetate 667 Mg Caps (Calcium Acetate (Phos Binder)) .... Take Two Capsules in The Middle of Each Meal Each Day 5)  Amiodarone Hcl 200 Mg Tabs (Amiodarone Hcl) .... Take 1 Tablet By Mouth Once A Day 6)  Simvastatin 10 Mg Tabs (Simvastatin) .[name].. 1 By Mouth Daily 7)  Humalog Mix 50/50 50-50 % Susp (Insulin Lispro Prot & Lispro) .... 20 Units Each Am  Allergies (verified): 1)  ! * Ivp Dye 2)  ! 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  The right heart catheterization showed elevated left   and right heart filling pressures with pulmonary artery pressure   elevated mildly out of proportion to the wedge.  The left heart   catheterization showed diffuse distal vessel disease as well as a 75%   stenosis in the mid circumflex with a 90% stenosis of the ostial first   obtuse marginal.  These lesions were in close proximity.  There was a 60-   70% mid RCA stenosis.  There was subtotal occlusion of the PDA, however,   this appears to have been a small vessel and there is some supply to the   PDA territory from an acute marginal.  The circumflex was relatively   small and did not supply a large territory Renal failure  Review of Systems  The patient denies hypoglycemia.    Physical Exam  General:  morbidly obese.  no distress  Neck:  Supple without thyroid enlargement or tenderness.  Additional Exam:  outside test results are reviewed:  a1c=8.1   Impression & Recommendations:  Problem # 1:  DIABETES MELLITUS, TYPE II (ICD-250.00) needs increased rx  Medications Added to Medication List This Visit: 1)  Humalog Mix 50/50 50-50 % Susp (Insulin lispro prot & lispro) .... 25 units each am 2)  Hemoglobin A1c  .[name].. 250.41  Other Orders: TLB-A1C / Hgb A1C (Glycohemoglobin) (  65784-O9G) Est. Patient Level III (29528)  Patient Instructions: 1)  blood tests are being ordered for you today.  please call [phone] to hear your test results. 2)  pending the test results, please increase humalog 50/50, to 25 units each am.  here are some sample bottles. 3)  check your blood sugar 1 time a day.  vary the time of day when you check, between before the 3 meals, and at bedtime.  also check if you have symptoms of your blood sugar being too high or too low.  please keep a record of the readings and bring it to your next appointment here.  please call us sooner if you are having low  blood sugar episodes. 4)  Please schedule a follow-up appointment in 6 weeks.   Prescriptions: HEMOGLOBIN A1C 250.41  #1 x 0   Entered and Authorized by:   Minus Breeding MD   Signed by:   Minus Breeding MD on 11/01/2009   Method used:   Print then Give to Patient   RxID:   [id number]    Orders Added: 1)  TLB-A1C / Hgb A1C (Glycohemoglobin) [83036-A1C] 2)  Est. Patient Level III [25956]

## 2010-02-08 NOTE — Assessment & Plan Note (Signed)
Summary: post hosp f/u / /#/cd--RS'D/CD   Vital Signs:  Patient profile:   65 year old female Weight:      235 pounds Temp:     97.7 degrees F oral Pulse rate:   90 / minute BP sitting:   122 / 70  (left arm)  Vitals Entered By: Tora Perches (March 02, 2009 3:01 PM) CC: post hosp Is Patient Diabetic? Yes Comments pt state she takes isosorbide 50 mg unable to put into computer not aware of what mg  pt is taking to make 50 .   Primary Care Provider:  dr. Posey Rea  CC:  post hosp.  History of Present Illness: The patient presents for a post-hospital visit for syncope due to CBG 29 DISCHARGE DIAGNOSES:   1. Coronary artery disease with a non-ST elevation myocardial       infarction.   2. Severe systolic acute congestive heart failure, most likely       etiology thought to be tachycardia-mediated.   3. Altered mental status secondary to severe hypoglycemia.   4. Diabetes.   5. End-stage renal disease on hemodialysis Tuesday, Thursday,       Saturday.   6. Urinary tract infection.   7. Pulmonary edema as a result of congestive heart failure.   8. Sinus tachycardia.   9. Deep vein thrombosis prophylaxis.  The patient presents for a follow up of hypertension, diabetes, hyperlipidemia, CAD   Preventive Screening-Counseling & Management  Alcohol-Tobacco     Smoking Status: never  Current Medications (verified): 1)  Metoprolol Tartrate 100 Mg Tabs (Metoprolol Tartrate) .... Take 1 Tablet Two Times A Day 2)  Aspirin 325 Mg  Tbec (Aspirin) .... Take 1 By Mouth Qd 3)  Novolog Mix 70/30 70-30 %  Susp (Insulin Aspart Prot & Aspart) .[name].. 15 Units Qam 4)  Isosorbide Mononitrate Cr 60 Mg Xr24h-Tab (Isosorbide Mononitrate) .... Take One Daily 5)  Pericalcitol .... Every Ann Held, Sat 6)  Amiodarone Hcl 200 Mg Tabs (Amiodarone Hcl) .... 2 By Mouth Q8hours 7)  Simvastatin 10 Mg Tabs (Simvastatin) .[name].. 1 By Mouth Daily  Allergies: 1)  ! * Ivp Dye 2)  ! Ace Inhibitors 3)   Lisinopril (Lisinopril)  Past History:  Past Surgical History: Last updated: 04/10/2008 Colostomy Urostomy EGD (03/18/2004) EKG (04/27/2006) Placement of new left forearm arteriovenous graft.  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  The right heart catheterization showed elevated left   and right heart filling pressures with pulmonary artery pressure   elevated mildly out of proportion to the wedge.  The left heart   catheterization showed diffuse distal vessel disease as well as a 75%   stenosis in the mid circumflex with a 90% stenosis of the ostial first   obtuse marginal.  These lesions were in close proximity.  There was a 60-   70% mid RCA stenosis.  There was subtotal occlusion of the PDA, however,   this appears to have been a small vessel and there is some supply to the   PDA territory from an acute marginal.  The circumflex was relatively  small and did not supply a large territory Renal failure  Review of Systems       The patient complains of weight gain and dyspnea on exertion.  The patient denies abdominal pain, melena, and hematochezia.    Physical Exam  General:  obese.  no distress Nose:  External nasal examination shows no deformity or inflammation. Nasal mucosa are pink and moist without lesions or exudates. Mouth:  Oral mucosa and oropharynx without lesions or exudates.  Teeth in good repair. Neck:  No JVD Lungs:  no crackles and no wheezes.   Heart:  normal  rate, regular rhythm, no gallop, no JVD, and no HJR.   Abdomen:  Obesity precludes accurate exam, NT Msk:  LS spine w/decreased ROM.   Pulses:  WNL Extremities:  No edema Neurologic:  sensation is intact to touch on the feet  Skin:  toenails w/onycho Cervical Nodes:  No lymphadenopathy noted Inguinal Nodes:  No significant adenopathy Psych:  Oriented X3.     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  The right heart catheterization showed elevated left   and right heart filling pressures with pulmonary artery pressure   elevated mildly out of proportion to the wedge.  The left heart   catheterization showed diffuse distal vessel disease as well as a 75%   stenosis in the mid circumflex with a 90% stenosis of the ostial first   obtuse marginal.  These lesions were in close proximity.  There was a 60-   70% mid RCA stenosis.  There was subtotal occlusion of the PDA, however,   this appears to have been a small vessel and there is some supply to the   PDA territory from an acute marginal.  The circumflex was relatively   small and did not supply a large territory Handicapped form filled out  Problem # 2:  LBBB (ICD-426.3) Assessment: Unchanged  Problem # 3:  EDEMA (ICD-782.3) Assessment: Improved  Problem # 4:  SYNCOPE (ICD-780.2) due to low CBG Assessment: Comment Only The office visit took longer than 45 min with patient councelling for more than 50% of the 45 min   Problem # 5:  HYPERLIPIDEMIA-MIXED (ICD-272.4)  Her updated medication list for this problem includes:    Simvastatin 10 Mg Tabs (Simvastatin) .[name]... 1 by mouth daily  Problem # 6:  DIABETES MELLITUS, TYPE II (ICD-250.00) Assessment: Improved  Her updated medication list for this problem  includes:    Aspirin 325 Mg Tbec (Aspirin) .[name]... Take 1 by mouth qd    Novolog Mix 70/30 70-30 % Susp (Insulin aspart prot & aspart) .Marland KitchenMarland KitchenMarland KitchenMarland Kitchen 15 units qam  Orders: Podiatry Referral (Podiatry)  Problem # 7:  RENAL FAILURE  on HD Assessment: Improved  Problem # 8:  FOOT PAIN (ICD-729.5) R>L w/onycho Assessment: Deteriorated  Orders: Podiatry Referral (Podiatry)  Complete Medication List: 1)  Metoprolol Tartrate 100 Mg Tabs (Metoprolol tartrate) .... Take 1 tablet two times a day 2)  Aspirin 325 Mg Tbec (Aspirin) .... Take 1 by mouth qd 3)  Novolog Mix 70/30 70-30 % Susp (Insulin aspart prot & aspart) .[name].. 15 units qam 4)  Isosorbide Mononitrate Cr 60 Mg Xr24h-tab (Isosorbide mononitrate) .... Take one daily 5)  Pericalcitol  .... Every thue, thurs, sat 6)  Amiodarone Hcl 200 Mg Tabs (Amiodarone hcl) .Marland KitchenMarland KitchenMarland Kitchen  2 by mouth q8hours 7)  Simvastatin 10 Mg Tabs (Simvastatin) .[name].. 1 by mouth daily  Other Orders: Pneumococcal Vaccine (16109) Admin 1st Vaccine (60454) Admin 1st Vaccine [facility]) [phone])  Patient Instructions: 1)  Please schedule a follow-up appointment in 3 months. 2)  BMP prior to visit, ICD-9: 3)  Hepatic Panel prior to visit, ICD-9: 995.20  414.8 4)  Lipid Panel prior to visit, ICD-9: 5)  HbgA1C prior to visit, ICD-9:   Pneumovax Vaccine    Vaccine Type: Pneumovax    Site: right deltoid    Mfr: Merck    Dose: 0.5 ml    Route: IM    Given by: Tora Perches    Exp. Date: 04/29/2010    Lot #: [id number]    VIS given: 08/07/95 version given March 02, 2009.

## 2010-02-09 ENCOUNTER — Encounter: Payer: Self-pay | Admitting: Internal Medicine

## 2010-02-10 NOTE — Progress Notes (Signed)
Summary: Canceled BE at the [facility].   Phone Note Call from Patient Call back at Home Phone [phone]   Caller: Patient Call For: Dr. Leone Payor Reason for Call: Talk to Nurse Summary of Call: Needs to cancel her Barium Enema at the hosp. She has had a stroke and will C/B to r/s Initial call taken by: Karna Christmas,  January 20, 2010 10:02 AM  Follow-up for Phone Call        Spoke with patient who stated she hopes to r/s BE next month; she stated she is making progress with home rehab. Per Jerome, patient had a CVA on 01/09/10.  Dr Leone Payor, [place] Childes. Graciella Freer RN  January 20, 2010 10:57 AM   Additional Follow-up for Phone Call Additional follow up Details #1::        we need toplace a recall or tickler on this. Leave a phone mssage with dr. Arita Miss office about this also  Additional Follow-up by: Iva Boop MD, Clementeen Graham,  January 21, 2010 5:52 AM    Additional Follow-up for Phone Call Additional follow up Details #2::    Helmut Muster at Dr [facility] office notified.  I have sent myself a flag with a reminder to call her next week. Follow-up by: Darcey Nora RN, CGRN,  January 21, 2010 11:45 AM

## 2010-02-10 NOTE — Progress Notes (Signed)
Summary: VERBAL ORDER FOR THERAPY  Phone Note From Other Clinic   Caller: [phone] Olegario Messier - Therapist, [facility] Summary of Call: Therapist called - They are req verbal ok for OT services.  Initial call taken by: Lamar Sprinkles, CMA,  January 18, 2010 5:11 PM  Follow-up for Phone Call        ok Thank you!  Follow-up by: Tresa Garter MD,  January 18, 2010 5:26 PM  Additional Follow-up for Phone Call Additional follow up Details #1::        Kathy notified/lmovm.Alvy Beal Archie CMA  January 19, 2010 9:28 AM

## 2010-02-10 NOTE — Progress Notes (Signed)
Summary: discussion with Dr. Donell Beers   Phone Note Outgoing Call   Summary of Call: I spoke to Dr. Donell Beers re: failed attempt at colonoscopy due to inability to get venous access. We have decided to discuss repeating BE vs colonoscopy attempt again with patient will review CT and BE with radiologist also.   Follow-up for Phone Call        reviewed CT and BE films with radiologist, the stricture may be entrapment of bowel in abdominal wall, hernia. Iva Boop MD, [facility]  December 22, 2009 3:24 PM Please tell patient that after the discussion with Dr. Donell Beers and reviewing these films, I think possibility of scope entrapment in hernia (happened 5 years ago) and combined with IV access issues, recommend she repeat the BE in January - follow-up spasm and suspected stricture  Follow-up by: Iva Boop MD, Clementeen Graham,  December 22, 2009 3:28 PM  Additional Follow-up for Phone Call Additional follow up Details #1::        Patient is instructed to go for BE at [facility] for 01-25-09 10:00.  Patient  is advised to pick up prep kit and instructions at [facility] radiology Additional Follow-up by: Darcey Nora RN, CGRN,  December 22, 2009 4:54 PM

## 2010-02-16 NOTE — Miscellaneous (Signed)
Summary: Order/[facility]  Order/[facility]   Imported By: Lester Naponee 02/09/2010 10:08:44  _____________________________________________________________________  External Attachment:    Type:   Image     Comment:   External Document

## 2010-02-18 ENCOUNTER — Encounter: Payer: Self-pay | Admitting: Internal Medicine

## 2010-02-18 DIAGNOSIS — M6281 Muscle weakness (generalized): Secondary | ICD-10-CM

## 2010-02-18 DIAGNOSIS — I69998 Other sequelae following unspecified cerebrovascular disease: Secondary | ICD-10-CM

## 2010-02-18 DIAGNOSIS — R279 Unspecified lack of coordination: Secondary | ICD-10-CM

## 2010-02-18 DIAGNOSIS — IMO0001 Reserved for inherently not codable concepts without codable children: Secondary | ICD-10-CM

## 2010-03-01 ENCOUNTER — Other Ambulatory Visit (HOSPITAL_COMMUNITY): Payer: Self-pay | Admitting: Nephrology

## 2010-03-01 DIAGNOSIS — N186 End stage renal disease: Secondary | ICD-10-CM

## 2010-03-02 ENCOUNTER — Telehealth (INDEPENDENT_AMBULATORY_CARE_PROVIDER_SITE_OTHER): Payer: Self-pay

## 2010-03-02 NOTE — Miscellaneous (Signed)
Summary: Certification & Plan of Care / [facility]  Certification & Plan of Care / [facility]   Imported By: Lennie Odor 02/24/2010 16:33:30  _____________________________________________________________________  External Attachment:    Type:   Image     Comment:   External Document

## 2010-03-02 NOTE — Miscellaneous (Signed)
Summary: Face to Face Encounter / [facility]  Face to Face Encounter / [facility]   Imported By: Lennie Odor 02/24/2010 16:45:45  _____________________________________________________________________  External Attachment:    Type:   Image     Comment:   External Document

## 2010-03-03 ENCOUNTER — Other Ambulatory Visit: Payer: Self-pay | Admitting: Internal Medicine

## 2010-03-03 DIAGNOSIS — K9403 Colostomy malfunction: Secondary | ICD-10-CM

## 2010-03-08 NOTE — Progress Notes (Signed)
Summary: Schedule BE   Phone Note Outgoing Call Call back at [facility] Phone [phone]   Call placed by: Darcey Nora RN, CGRN,  March 02, 2010 11:58 AM Call placed to: Patient Summary of Call: I have left a message for the patient to call back to schedule BE Initial call taken by: Darcey Nora RN, CGRN,  March 02, 2010 11:58 AM  Follow-up for Phone Call        Patient is scheduled for BE at [facility] on 04/07/10 10:00.  She is reminded that she will need to go pick up her prep kit from [facility] prior to the test. Follow-up by: Darcey Nora RN, CGRN,  March 03, 2010 4:21 PM

## 2010-03-10 ENCOUNTER — Ambulatory Visit (HOSPITAL_COMMUNITY)
Admission: RE | Admit: 2010-03-10 | Discharge: 2010-03-10 | Disposition: A | Discharge status: Home / Self Care | Payer: Medicare Other | Source: Ambulatory Visit | Attending: Nephrology | Admitting: Nephrology

## 2010-03-10 DIAGNOSIS — N186 End stage renal disease: Secondary | ICD-10-CM | POA: Insufficient documentation

## 2010-03-10 DIAGNOSIS — T82898A Other specified complication of vascular prosthetic devices, implants and grafts, initial encounter: Secondary | ICD-10-CM | POA: Insufficient documentation

## 2010-03-10 DIAGNOSIS — Y832 Surgical operation with anastomosis, bypass or graft as the cause of abnormal reaction of the patient, or of later complication, without mention of misadventure at the time of the procedure: Secondary | ICD-10-CM | POA: Insufficient documentation

## 2010-03-10 MED ORDER — IOHEXOL 300 MG/ML  SOLN
100.0000 mL | Freq: Once | INTRAMUSCULAR | Status: AC | PRN
  Administered 2010-03-10: 55 mL via INTRAVENOUS

## 2010-03-21 LAB — CBC
HCT: 36.3 % (ref 36.0–46.0)
HCT: 37 % (ref 36.0–46.0)
Hemoglobin: 11.8 g/dL — ABNORMAL LOW (ref 12.0–15.0)
Hemoglobin: 11.8 g/dL — ABNORMAL LOW (ref 12.0–15.0)
MCH: 28.3 pg (ref 26.0–34.0)
MCH: 29.2 pg (ref 26.0–34.0)
MCH: 29.3 pg (ref 26.0–34.0)
MCH: 29.4 pg (ref 26.0–34.0)
MCH: 29.7 pg (ref 26.0–34.0)
MCHC: 32.4 g/dL (ref 30.0–36.0)
MCHC: 32.5 g/dL (ref 30.0–36.0)
MCV: 89.9 fL (ref 78.0–100.0)
MCV: 90.2 fL (ref 78.0–100.0)
MCV: 91.1 fL (ref 78.0–100.0)
MCV: 91.6 fL (ref 78.0–100.0)
MCV: 91.7 fL (ref 78.0–100.0)
Platelets: 180 10*3/uL (ref 150–400)
Platelets: 210 10*3/uL (ref 150–400)
Platelets: 214 10*3/uL (ref 150–400)
Platelets: 231 10*3/uL (ref 150–400)
RBC: 3.79 MIL/uL — ABNORMAL LOW (ref 3.87–5.11)
RBC: 4.02 MIL/uL (ref 3.87–5.11)
RBC: 4.04 MIL/uL (ref 3.87–5.11)
RBC: 4.06 MIL/uL (ref 3.87–5.11)
RBC: 4.17 MIL/uL (ref 3.87–5.11)
RDW: 14 % (ref 11.5–15.5)
RDW: 14.3 % (ref 11.5–15.5)
RDW: 14.4 % (ref 11.5–15.5)
WBC: 8.6 10*3/uL (ref 4.0–10.5)

## 2010-03-21 LAB — DIFFERENTIAL
Basophils Absolute: 0 10*3/uL (ref 0.0–0.1)
Eosinophils Absolute: 0.1 10*3/uL (ref 0.0–0.7)
Eosinophils Relative: 1 % (ref 0–5)
Lymphocytes Relative: 19 % (ref 12–46)
Lymphs Abs: 1.3 10*3/uL (ref 0.7–4.0)
Lymphs Abs: 1.4 10*3/uL (ref 0.7–4.0)
Monocytes Absolute: 0.6 10*3/uL (ref 0.1–1.0)
Monocytes Absolute: 0.7 10*3/uL (ref 0.1–1.0)
Monocytes Relative: 9 % (ref 3–12)
Neutro Abs: 5.1 10*3/uL (ref 1.7–7.7)

## 2010-03-21 LAB — URINALYSIS, ROUTINE W REFLEX MICROSCOPIC
Bilirubin Urine: NEGATIVE
Bilirubin Urine: NEGATIVE
Glucose, UA: NEGATIVE mg/dL
Glucose, UA: NEGATIVE mg/dL
Ketones, ur: NEGATIVE mg/dL
Ketones, ur: NEGATIVE mg/dL
Nitrite: NEGATIVE
Protein, ur: 100 mg/dL — AB
Protein, ur: 100 mg/dL — AB
Urobilinogen, UA: 0.2 mg/dL (ref 0.0–1.0)

## 2010-03-21 LAB — RENAL FUNCTION PANEL
Albumin: 2.8 g/dL — ABNORMAL LOW (ref 3.5–5.2)
BUN: 66 mg/dL — ABNORMAL HIGH (ref 6–23)
BUN: 68 mg/dL — ABNORMAL HIGH (ref 6–23)
CO2: 24 mEq/L (ref 19–32)
CO2: 26 mEq/L (ref 19–32)
Calcium: 8.6 mg/dL (ref 8.4–10.5)
Chloride: 97 mEq/L (ref 96–112)
Chloride: 98 mEq/L (ref 96–112)
Creatinine, Ser: 10.35 mg/dL — ABNORMAL HIGH (ref 0.4–1.2)
GFR calc Af Amer: 5 mL/min — ABNORMAL LOW (ref >60)
GFR calc Af Amer: 6 mL/min — ABNORMAL LOW (ref >60)
GFR calc Af Amer: 8 mL/min — ABNORMAL LOW (ref >60)
GFR calc non Af Amer: 5 mL/min — ABNORMAL LOW (ref >60)
GFR calc non Af Amer: 7 mL/min — ABNORMAL LOW (ref >60)
Phosphorus: 7.6 mg/dL — ABNORMAL HIGH (ref 2.3–4.6)
Potassium: 4 mEq/L (ref 3.5–5.1)
Potassium: 4.6 mEq/L (ref 3.5–5.1)
Sodium: 132 mEq/L — ABNORMAL LOW (ref 135–145)
Sodium: 136 mEq/L (ref 135–145)

## 2010-03-21 LAB — COMPREHENSIVE METABOLIC PANEL
ALT: 12 U/L (ref 0–35)
AST: 18 U/L (ref 0–37)
Albumin: 3.2 g/dL — ABNORMAL LOW (ref 3.5–5.2)
BUN: 56 mg/dL — ABNORMAL HIGH (ref 6–23)
CO2: 25 mEq/L (ref 19–32)
Calcium: 8.5 mg/dL (ref 8.4–10.5)
Calcium: 8.9 mg/dL (ref 8.4–10.5)
Chloride: 95 mEq/L — ABNORMAL LOW (ref 96–112)
Creatinine, Ser: 10.15 mg/dL — ABNORMAL HIGH (ref 0.4–1.2)
Creatinine, Ser: 9.87 mg/dL — ABNORMAL HIGH (ref 0.4–1.2)
GFR calc non Af Amer: 4 mL/min — ABNORMAL LOW (ref >60)
Glucose, Bld: 138 mg/dL — ABNORMAL HIGH (ref 70–99)
Total Bilirubin: 0.3 mg/dL (ref 0.3–1.2)
Total Bilirubin: 0.5 mg/dL (ref 0.3–1.2)
Total Protein: 7.6 g/dL (ref 6.0–8.3)

## 2010-03-21 LAB — POCT CARDIAC MARKERS
CKMB, poc: 2.8 ng/mL (ref 1.0–8.0)
Myoglobin, poc: >500 ng/mL (ref 12–200)
Troponin i, poc: <0.05 ng/mL (ref 0.00–0.09)
Troponin i, poc: <0.05 ng/mL (ref 0.00–0.09)

## 2010-03-21 LAB — GLUCOSE, CAPILLARY
Glucose-Capillary: 161 mg/dL — ABNORMAL HIGH (ref 70–99)
Glucose-Capillary: 171 mg/dL — ABNORMAL HIGH (ref 70–99)
Glucose-Capillary: 172 mg/dL — ABNORMAL HIGH (ref 70–99)
Glucose-Capillary: 204 mg/dL — ABNORMAL HIGH (ref 70–99)
Glucose-Capillary: 222 mg/dL — ABNORMAL HIGH (ref 70–99)
Glucose-Capillary: 244 mg/dL — ABNORMAL HIGH (ref 70–99)
Glucose-Capillary: 256 mg/dL — ABNORMAL HIGH (ref 70–99)
Glucose-Capillary: 268 mg/dL — ABNORMAL HIGH (ref 70–99)

## 2010-03-21 LAB — POCT I-STAT, CHEM 8
Calcium, Ion: 0.98 mmol/L — ABNORMAL LOW (ref 1.12–1.32)
Creatinine, Ser: 9.5 mg/dL — ABNORMAL HIGH (ref 0.4–1.2)
Glucose, Bld: 179 mg/dL — ABNORMAL HIGH (ref 70–99)
Hemoglobin: 13.3 g/dL (ref 12.0–15.0)
Sodium: 136 mEq/L (ref 135–145)
TCO2: 26 mmol/L (ref 0–100)

## 2010-03-21 LAB — HEMOGLOBIN A1C: Hgb A1c MFr Bld: 7.7 % — ABNORMAL HIGH (ref <5.7)

## 2010-03-21 LAB — URINE CULTURE
Colony Count: 45000
Culture  Setup Time: 201201012120

## 2010-03-21 LAB — BASIC METABOLIC PANEL
BUN: 45 mg/dL — ABNORMAL HIGH (ref 6–23)
CO2: 25 mEq/L (ref 19–32)
Chloride: 96 mEq/L (ref 96–112)
Creatinine, Ser: 8.24 mg/dL — ABNORMAL HIGH (ref 0.4–1.2)

## 2010-03-21 LAB — LIPID PANEL
HDL: 37 mg/dL — ABNORMAL LOW (ref >39)
Total CHOL/HDL Ratio: 3.8 RATIO
Triglycerides: 189 mg/dL — ABNORMAL HIGH (ref <150)
VLDL: 38 mg/dL (ref 0–40)

## 2010-03-21 LAB — PROTIME-INR
INR: 0.94 (ref 0.00–1.49)
Prothrombin Time: 12.8 seconds (ref 11.6–15.2)

## 2010-03-21 LAB — CARDIAC PANEL(CRET KIN+CKTOT+MB+TROPI)
Relative Index: 1.8 (ref 0.0–2.5)
Troponin I: 0.03 ng/mL (ref 0.00–0.06)

## 2010-03-21 LAB — CK TOTAL AND CKMB (NOT AT ARMC)
CK, MB: 4 ng/mL (ref 0.3–4.0)
Total CK: 237 U/L — ABNORMAL HIGH (ref 7–177)

## 2010-03-21 LAB — CULTURE, BLOOD (ROUTINE X 2): Culture: NO GROWTH

## 2010-03-21 LAB — URINE MICROSCOPIC-ADD ON

## 2010-03-22 ENCOUNTER — Ambulatory Visit (INDEPENDENT_AMBULATORY_CARE_PROVIDER_SITE_OTHER): Payer: Medicare Other | Admitting: Vascular Surgery

## 2010-03-22 DIAGNOSIS — N186 End stage renal disease: Secondary | ICD-10-CM

## 2010-03-22 NOTE — H&P (Signed)
HISTORY AND PHYSICAL EXAMINATION  March 22, 2010  Re:  [name], [name]               DOB:  Jan 05, 1946  Patient is a 65 year old female on hemodialysis with end-stage renal disease.  She had a left forearm AV graft placed in 2010 by Dr. Edilia Bo. Because of high venous pressures, the patient recently had a shuntogram on March 1 which I have reviewed extensively.  The graft is patent, but the venous outflow is totally occluded with a circuitous filling by collateral circulation of the left basilic vein, which is a large vein and empties into the subclavian vein centrally with no central vein stenosis.  Patient is referred for vascular access evaluation.  CHRONIC MEDICAL PROBLEMS: 1. Diabetes mellitus type 1. 2. Hypertension. 3. Hyperlipidemia. 4. History of left brain stroke in 2005. 5. Negative for coronary artery disease, COPD.  SOCIAL HISTORY:  The patient is single, has 1 child.  Is retired.  Does not use tobacco or alcohol.  FAMILY HISTORY:  Positive for coronary artery disease in her mother, diabetes in her father, negative for stroke.  REVIEW OF SYSTEMS:  Positive for stroke in December 2011, which consisted of some weakness in the right arm and leg, which has improved significantly.  She is ambulatory.  Denies any lower extremity claudication, is able to ambulate 1 block.  She does have the chronic renal insufficiency, as noted, but denies any other specific symptoms in complete review of systems.  PHYSICAL EXAMINATION:  Blood pressure 147/77, heart rate 96, respirations 24, temperature 97.9.  General:  She is an obese female who is in no apparent distress, alert and oriented x3.  HEENT:  Normal for age.  EOMs intact.  Lungs:  Clear to auscultation.  No rhonchi or wheezing.  Cardiovascular:  Regular rhythm.  No murmurs.  Carotid pulses are 3+.  No audible bruits.  Abdomen:  Obese.  No palpable masses. Musculoskeletal:  Free of major deformities.   Neurologic:  Normal. Skin:  Free of rashes.  Lower extremity exam reveals 2+ dorsalis pedis pulses.  No edema.  Upper extremity exam reveals the left arm to have a functioning AV graft in the forearm with a good pulse and a palpable thrill.  The right arm has a 3+ brachial and radial pulse.  After reviewing the shuntogram, it appears that the best option for this lady would be to try to convert her to a left basilic vein transposition since it is a large-sized vein.  The other option would be to revise the forearm graft into the basilic system, which would then eliminate any potential options in the future for basilic vein AV fistula.  She will also require Diatek catheter.  I have discussed this at length with the patient.  She understands and would like proceed.  We have scheduled this for Friday, March 16, which is a normal dialysis day for her, so we will notify the dialysis center so she can be dialyzed the following day on Saturday.    Quita Skye Hart Rochester, M.D. Electronically Signed  JDL/MEDQ  D:  03/22/2010  T:  03/22/2010  Job:  [id number]

## 2010-03-25 ENCOUNTER — Ambulatory Visit (HOSPITAL_COMMUNITY)
Admission: RE | Admit: 2010-03-25 | Discharge: 2010-03-25 | Disposition: A | Discharge status: Home / Self Care | Payer: Medicare Other | Source: Ambulatory Visit | Attending: Vascular Surgery | Admitting: Vascular Surgery

## 2010-03-25 ENCOUNTER — Ambulatory Visit (HOSPITAL_COMMUNITY): Payer: Medicare Other

## 2010-03-25 DIAGNOSIS — T82898A Other specified complication of vascular prosthetic devices, implants and grafts, initial encounter: Secondary | ICD-10-CM

## 2010-03-25 DIAGNOSIS — I517 Cardiomegaly: Secondary | ICD-10-CM | POA: Insufficient documentation

## 2010-03-25 DIAGNOSIS — J449 Chronic obstructive pulmonary disease, unspecified: Secondary | ICD-10-CM | POA: Insufficient documentation

## 2010-03-25 DIAGNOSIS — Z85038 Personal history of other malignant neoplasm of large intestine: Secondary | ICD-10-CM | POA: Insufficient documentation

## 2010-03-25 DIAGNOSIS — Z01818 Encounter for other preprocedural examination: Secondary | ICD-10-CM | POA: Insufficient documentation

## 2010-03-25 DIAGNOSIS — Y832 Surgical operation with anastomosis, bypass or graft as the cause of abnormal reaction of the patient, or of later complication, without mention of misadventure at the time of the procedure: Secondary | ICD-10-CM | POA: Insufficient documentation

## 2010-03-25 DIAGNOSIS — N186 End stage renal disease: Secondary | ICD-10-CM

## 2010-03-25 DIAGNOSIS — I120 Hypertensive chronic kidney disease with stage 5 chronic kidney disease or end stage renal disease: Secondary | ICD-10-CM

## 2010-03-25 DIAGNOSIS — Z794 Long term (current) use of insulin: Secondary | ICD-10-CM | POA: Insufficient documentation

## 2010-03-25 DIAGNOSIS — E119 Type 2 diabetes mellitus without complications: Secondary | ICD-10-CM | POA: Insufficient documentation

## 2010-03-25 DIAGNOSIS — Z992 Dependence on renal dialysis: Secondary | ICD-10-CM | POA: Insufficient documentation

## 2010-03-25 DIAGNOSIS — I2510 Atherosclerotic heart disease of native coronary artery without angina pectoris: Secondary | ICD-10-CM | POA: Insufficient documentation

## 2010-03-25 DIAGNOSIS — J4489 Other specified chronic obstructive pulmonary disease: Secondary | ICD-10-CM | POA: Insufficient documentation

## 2010-03-25 DIAGNOSIS — Z01812 Encounter for preprocedural laboratory examination: Secondary | ICD-10-CM | POA: Insufficient documentation

## 2010-03-25 LAB — GLUCOSE, CAPILLARY: Glucose-Capillary: 171 mg/dL — ABNORMAL HIGH (ref 70–99)

## 2010-03-25 LAB — SURGICAL PCR SCREEN: Staphylococcus aureus: POSITIVE — AB

## 2010-03-25 LAB — POCT I-STAT 4, (NA,K, GLUC, HGB,HCT)
Hemoglobin: 11.9 g/dL — ABNORMAL LOW (ref 12.0–15.0)
Potassium: 4.5 mEq/L (ref 3.5–5.1)

## 2010-03-28 NOTE — Op Note (Signed)
[name], [name]               ACCOUNT NO.:  [id number]  MEDICAL RECORD NO.:  [id number]           PATIENT TYPE:  O  LOCATION:  [facility]                         FACILITY:  [facility]  PHYSICIAN:  Quita Skye. Hart [place], M.D.  DATE OF BIRTH:  04-08-1945  DATE OF PROCEDURE:  03/25/2010 DATE OF DISCHARGE:  03/25/2010                              OPERATIVE REPORT   PREOPERATIVE DIAGNOSES:  End-stage renal disease with outflow venous occlusion of functioning left forearm AV graft.  POSTOPERATIVE DIAGNOSES:  End-stage renal disease with outflow venous occlusion of functioning left forearm AV graft.  PROCEDURES: 1. Ligation of left forearm AV graft. 2. Creation of a left basilic vein to brachial artery AV fistula     (basilic vein transposition). 3. Bilateral ultrasound localization internal jugular veins. 4. Insertion of Diatek catheter via right internal jugular vein (23     cm).  SURGEON:  Quita Skye. Hart [place], M.D.  FIRST ASSISTANT:  Pecola Leisure, PA.  ANESTHESIA:  LMA.  PROCEDURE:  The patient was taken to the operating room, placed in the supine position at which time, the left upper extremity was prepped, Betadine scrub and solution, draped in routine sterile manner after induction of satisfactory general anesthesia (LMA).  There was functioning left forearm graft with total occlusion of the venous outflow into a collateralized basilic vein which was large, inadequate for creation of a left upper arm fistula.  Course of the basilic vein had been marked using the SonoSite ultrasound down into the proximal forearm.  Incision was made in the forearm where the vein was carefully dissected free and preserved, it was arterialized with a good pulse and palpable thrill, filling via collaterals from the AV graft.  Basilic vein was exposed up to the high axilla, its branches ligated with 3-0 and 4-0 silk ties and divided.  It was an excellent vein, marked for orientation purposes.  It was  dissected free as far distally as possible into the proximal forearm where it was ligated and transected.  The AV graft was identified in the same area and was ligated with two #1 silk ties.  Brachial artery was exposed in the distal vein harvesting incision just proximal to the antecubital area, it was an excellent vessel with a good pulse.  The tunnel was then created on the anterior aspect of the upper arm in a slight curvilinear fashion and the basilic vein was then delivered through the tunnel being careful to preserve its orientation.  Brachial arteries occluded proximally and distally with vessel loops, opened with 15 blade, extended with Potts scissors. Basilic vein was then transected appropriately and anastomosed end-to- side with 6-0 Prolene, clamps released.  There was an excellent pulse and thrill in the new fistula and excellent radial arterial flow at the wrist.  Adequate hemostasis was achieved.  The wounds were both closed in layers with Vicryl in a subcuticular fashion with Dermabond. Attention then turned to the upper chest and neck.  Both internal jugular veins were imaged using B-mode ultrasound.  Both noted to be widely patent.  After prepping and draping, the right internal jugular vein was entered using a  supraclavicular approach.  Guidewire passed into the right atrium under fluoroscopic guidance.  After dilating the tract appropriately, a 23-cm Diatek catheter was passed through peel-away sheath, positioned in the right atrium, tunneled peripherally, and secured with nylon sutures.  The wound was closed with Vicryl in a subcuticular fashion.  Sterile dressing applied.  The patient taken to recovery room in stable condition.     Quita Skye Hart Rochester, M.D.     JDL/MEDQ  D:  03/25/2010  T:  03/26/2010  Job:  [id number]  Electronically Signed by Josephina Gip M.D. on 03/28/2010 01:45:02 PM

## 2010-04-07 ENCOUNTER — Ambulatory Visit (HOSPITAL_COMMUNITY)
Admission: RE | Admit: 2010-04-07 | Discharge: 2010-04-07 | Disposition: A | Discharge status: Home / Self Care | Payer: Medicare Other | Source: Ambulatory Visit | Attending: Internal Medicine | Admitting: Internal Medicine

## 2010-04-07 ENCOUNTER — Other Ambulatory Visit: Payer: Self-pay | Admitting: Internal Medicine

## 2010-04-07 DIAGNOSIS — K9413 Enterostomy malfunction: Secondary | ICD-10-CM | POA: Insufficient documentation

## 2010-04-07 DIAGNOSIS — K9403 Colostomy malfunction: Secondary | ICD-10-CM

## 2010-04-08 ENCOUNTER — Telehealth: Payer: Self-pay

## 2010-04-08 DIAGNOSIS — K469 Unspecified abdominal hernia without obstruction or gangrene: Secondary | ICD-10-CM

## 2010-04-08 NOTE — Telephone Encounter (Signed)
I have scheduled a follow up appointment with Dr Donell Beers for 04/25/10 11:30.  I have left a message for the patient to call back to review the results and recommendations.

## 2010-04-08 NOTE — Progress Notes (Signed)
Quick Note:  Tell her it looks like her problems are from the bowel being herniated. She will need to follow-up with Dr. Donell Beers re possible surgical correction or other pans. If she does not have an appointment with her set up we should get her one.  Please cc the report and my notes here to Dr. Donell Beers. ______

## 2010-04-08 NOTE — Telephone Encounter (Signed)
Message copied by Darcey Nora on Fri Apr 08, 2010 12:46 PM ------      Message from: Stan Head      Created: Fri Apr 08, 2010 10:50 AM       Tell her it looks like her problems are from the bowel being herniated. She will need to follow-up with Dr. Donell Beers re possible surgical correction or other pans. If she does not have an appointment with her set up we should get her one.       Please cc the report and my notes here to Dr. Donell Beers.

## 2010-04-08 NOTE — Telephone Encounter (Signed)
Message copied by Darcey Nora on Fri Apr 08, 2010 12:53 PM ------      Message from: Stan Head      Created: Fri Apr 08, 2010 10:50 AM       Tell her it looks like her problems are from the bowel being herniated. She will need to follow-up with Dr. Donell Beers re possible surgical correction or other pans. If she does not have an appointment with her set up we should get her one.       Please cc the report and my notes here to Dr. Donell Beers.

## 2010-04-11 LAB — DIFFERENTIAL
Basophils Absolute: 0 10*3/uL (ref 0.0–0.1)
Basophils Relative: 0 % (ref 0–1)
Eosinophils Absolute: 0 10*3/uL (ref 0.0–0.7)
Eosinophils Absolute: 0.1 10*3/uL (ref 0.0–0.7)
Eosinophils Relative: 2 % (ref 0–5)
Lymphocytes Relative: 11 % — ABNORMAL LOW (ref 12–46)
Lymphocytes Relative: 14 % (ref 12–46)
Lymphs Abs: 1.1 10*3/uL (ref 0.7–4.0)
Lymphs Abs: 1.2 10*3/uL (ref 0.7–4.0)
Monocytes Absolute: 0.8 10*3/uL (ref 0.1–1.0)
Monocytes Absolute: 0.8 10*3/uL (ref 0.1–1.0)
Monocytes Relative: 6 % (ref 3–12)
Monocytes Relative: 8 % (ref 3–12)
Neutro Abs: 6.2 10*3/uL (ref 1.7–7.7)
Neutrophils Relative %: 75 % (ref 43–77)

## 2010-04-11 LAB — COMPREHENSIVE METABOLIC PANEL
ALT: 11 U/L (ref 0–35)
Albumin: 2.4 g/dL — ABNORMAL LOW (ref 3.5–5.2)
Alkaline Phosphatase: 115 U/L (ref 39–117)
BUN: 14 mg/dL (ref 6–23)
CO2: 28 mEq/L (ref 19–32)
Calcium: 8.1 mg/dL — ABNORMAL LOW (ref 8.4–10.5)
Chloride: 102 mEq/L (ref 96–112)
Creatinine, Ser: 3.36 mg/dL — ABNORMAL HIGH (ref 0.4–1.2)
GFR calc non Af Amer: 14 mL/min — ABNORMAL LOW (ref >60)
Glucose, Bld: 164 mg/dL — ABNORMAL HIGH (ref 70–99)
Glucose, Bld: 234 mg/dL — ABNORMAL HIGH (ref 70–99)
Potassium: 3.7 mEq/L (ref 3.5–5.1)
Sodium: 140 mEq/L (ref 135–145)
Total Protein: 6.1 g/dL (ref 6.0–8.3)

## 2010-04-11 LAB — CBC
HCT: 36 % (ref 36.0–46.0)
Hemoglobin: 10.7 g/dL — ABNORMAL LOW (ref 12.0–15.0)
Hemoglobin: 11.2 g/dL — ABNORMAL LOW (ref 12.0–15.0)
Hemoglobin: 11.3 g/dL — ABNORMAL LOW (ref 12.0–15.0)
MCHC: 32.5 g/dL (ref 30.0–36.0)
MCV: 87.4 fL (ref 78.0–100.0)
Platelets: 293 10*3/uL (ref 150–400)
RBC: 3.95 MIL/uL (ref 3.87–5.11)
RDW: 17.7 % — ABNORMAL HIGH (ref 11.5–15.5)
RDW: 18.2 % — ABNORMAL HIGH (ref 11.5–15.5)
WBC: 12.5 10*3/uL — ABNORMAL HIGH (ref 4.0–10.5)

## 2010-04-11 LAB — POCT I-STAT 3, ART BLOOD GAS (G3+)
Bicarbonate: 26.2 mEq/L — ABNORMAL HIGH (ref 20.0–24.0)
O2 Saturation: 97 %
TCO2: 27 mmol/L (ref 0–100)
pCO2 arterial: 41.5 mmHg (ref 35.0–45.0)
pO2, Arterial: 95 mmHg (ref 80.0–100.0)

## 2010-04-11 LAB — POCT I-STAT 3, VENOUS BLOOD GAS (G3P V)
Bicarbonate: 25.7 mEq/L — ABNORMAL HIGH (ref 20.0–24.0)
TCO2: 27 mmol/L (ref 0–100)
pCO2, Ven: 50 mmHg (ref 45.0–50.0)
pH, Ven: 7.319 — ABNORMAL HIGH (ref 7.250–7.300)
pO2, Ven: 41 mmHg (ref 30.0–45.0)

## 2010-04-11 LAB — GLUCOSE, CAPILLARY
Glucose-Capillary: 111 mg/dL — ABNORMAL HIGH (ref 70–99)
Glucose-Capillary: 200 mg/dL — ABNORMAL HIGH (ref 70–99)

## 2010-04-11 LAB — BASIC METABOLIC PANEL
GFR calc non Af Amer: 9 mL/min — ABNORMAL LOW (ref >60)
Glucose, Bld: 228 mg/dL — ABNORMAL HIGH (ref 70–99)
Potassium: 4.3 mEq/L (ref 3.5–5.1)
Sodium: 134 mEq/L — ABNORMAL LOW (ref 135–145)

## 2010-04-11 LAB — PROTIME-INR: Prothrombin Time: 13.1 seconds (ref 11.6–15.2)

## 2010-04-11 LAB — CARDIAC PANEL(CRET KIN+CKTOT+MB+TROPI)
CK, MB: 6.2 ng/mL — ABNORMAL HIGH (ref 0.3–4.0)
CK, MB: 9.6 ng/mL — ABNORMAL HIGH (ref 0.3–4.0)
Total CK: 153 U/L (ref 7–177)

## 2010-04-11 LAB — MAGNESIUM: Magnesium: 1.9 mg/dL (ref 1.5–2.5)

## 2010-04-11 LAB — MRSA PCR SCREENING: MRSA by PCR: NEGATIVE

## 2010-04-11 LAB — T4, FREE: Free T4: 1.27 ng/dL (ref 0.80–1.80)

## 2010-04-11 LAB — PHOSPHORUS: Phosphorus: 3.8 mg/dL (ref 2.3–4.6)

## 2010-04-11 NOTE — Telephone Encounter (Signed)
I have left another message for the patient to call back to discuss results and additional recommendations.

## 2010-04-12 LAB — COMPREHENSIVE METABOLIC PANEL
AST: 32 U/L (ref 0–37)
Albumin: 3.1 g/dL — ABNORMAL LOW (ref 3.5–5.2)
Calcium: 8.6 mg/dL (ref 8.4–10.5)
Creatinine, Ser: 4.14 mg/dL — ABNORMAL HIGH (ref 0.4–1.2)
GFR calc Af Amer: 13 mL/min — ABNORMAL LOW (ref >60)
GFR calc non Af Amer: 11 mL/min — ABNORMAL LOW (ref >60)

## 2010-04-12 LAB — URINALYSIS, ROUTINE W REFLEX MICROSCOPIC
Bilirubin Urine: NEGATIVE
Glucose, UA: NEGATIVE mg/dL
Ketones, ur: NEGATIVE mg/dL
Protein, ur: >300 mg/dL — AB
Urobilinogen, UA: 0.2 mg/dL (ref 0.0–1.0)

## 2010-04-12 LAB — DIFFERENTIAL
Eosinophils Relative: 0 % (ref 0–5)
Lymphocytes Relative: 9 % — ABNORMAL LOW (ref 12–46)
Lymphs Abs: 1.3 10*3/uL (ref 0.7–4.0)
Monocytes Absolute: 0.6 10*3/uL (ref 0.1–1.0)
Neutro Abs: 12.1 10*3/uL — ABNORMAL HIGH (ref 1.7–7.7)

## 2010-04-12 LAB — POCT I-STAT 3, ART BLOOD GAS (G3+)
Acid-Base Excess: 2 mmol/L (ref 0.0–2.0)
Bicarbonate: 31.7 mEq/L — ABNORMAL HIGH (ref 20.0–24.0)
pCO2 arterial: 71 mmHg (ref 35.0–45.0)
pO2, Arterial: 85 mmHg (ref 80.0–100.0)

## 2010-04-12 LAB — CBC
MCHC: 31.3 g/dL (ref 30.0–36.0)
MCV: 88.9 fL (ref 78.0–100.0)
Platelets: 296 10*3/uL (ref 150–400)

## 2010-04-12 LAB — GLUCOSE, CAPILLARY
Glucose-Capillary: 29 mg/dL — CL (ref 70–99)
Glucose-Capillary: 38 mg/dL — CL (ref 70–99)

## 2010-04-12 LAB — BRAIN NATRIURETIC PEPTIDE: Pro B Natriuretic peptide (BNP): 650 pg/mL — ABNORMAL HIGH (ref 0.0–100.0)

## 2010-04-12 LAB — POCT CARDIAC MARKERS
CKMB, poc: 6 ng/mL (ref 1.0–8.0)
Myoglobin, poc: >500 ng/mL (ref 12–200)

## 2010-04-12 LAB — URINE MICROSCOPIC-ADD ON

## 2010-04-12 NOTE — Telephone Encounter (Signed)
Patient advised of results and Dr Marvell Fuller recommendations.  She is also advised to see Dr Donell Beers on 04/25/10.  Patient verbalized understanding of appointment date and time.

## 2010-04-13 LAB — CBC
HCT: 32.9 % — ABNORMAL LOW (ref 36.0–46.0)
Hemoglobin: 10.6 g/dL — ABNORMAL LOW (ref 12.0–15.0)
MCV: 86.9 fL (ref 78.0–100.0)
Platelets: 250 10*3/uL (ref 150–400)
WBC: 6.6 10*3/uL (ref 4.0–10.5)

## 2010-04-13 LAB — FERRITIN: Ferritin: 462 ng/mL — ABNORMAL HIGH (ref 10–291)

## 2010-04-13 LAB — IRON AND TIBC
Saturation Ratios: 19 % — ABNORMAL LOW (ref 20–55)
UIBC: 177 ug/dL

## 2010-04-14 LAB — IRON AND TIBC
Iron: 40 ug/dL — ABNORMAL LOW (ref 42–135)
UIBC: 175 ug/dL

## 2010-04-14 LAB — BASIC METABOLIC PANEL
CO2: 18 mEq/L — ABNORMAL LOW (ref 19–32)
Chloride: 111 mEq/L (ref 96–112)
GFR calc Af Amer: 9 mL/min — ABNORMAL LOW (ref >60)
Glucose, Bld: 128 mg/dL — ABNORMAL HIGH (ref 70–99)
Sodium: 141 mEq/L (ref 135–145)

## 2010-04-14 LAB — POCT HEMOGLOBIN-HEMACUE
Hemoglobin: 10.7 g/dL — ABNORMAL LOW (ref 12.0–15.0)
Hemoglobin: 6.2 g/dL — CL (ref 12.0–15.0)

## 2010-04-14 LAB — RENAL FUNCTION PANEL
Albumin: 2.9 g/dL — ABNORMAL LOW (ref 3.5–5.2)
CO2: 23 mEq/L (ref 19–32)
Calcium: 7.5 mg/dL — ABNORMAL LOW (ref 8.4–10.5)
Chloride: 111 mEq/L (ref 96–112)
Creatinine, Ser: 4.83 mg/dL — ABNORMAL HIGH (ref 0.4–1.2)
GFR calc Af Amer: 11 mL/min — ABNORMAL LOW (ref >60)
GFR calc non Af Amer: 9 mL/min — ABNORMAL LOW (ref >60)
Sodium: 142 mEq/L (ref 135–145)

## 2010-04-14 LAB — GLUCOSE, CAPILLARY
Glucose-Capillary: 112 mg/dL — ABNORMAL HIGH (ref 70–99)
Glucose-Capillary: 115 mg/dL — ABNORMAL HIGH (ref 70–99)
Glucose-Capillary: 119 mg/dL — ABNORMAL HIGH (ref 70–99)
Glucose-Capillary: 123 mg/dL — ABNORMAL HIGH (ref 70–99)
Glucose-Capillary: 63 mg/dL — ABNORMAL LOW (ref 70–99)
Glucose-Capillary: 72 mg/dL (ref 70–99)

## 2010-04-14 LAB — DIFFERENTIAL
Basophils Absolute: 0 10*3/uL (ref 0.0–0.1)
Basophils Relative: 0 % (ref 0–1)
Eosinophils Relative: 1 % (ref 0–5)
Monocytes Absolute: 0.5 10*3/uL (ref 0.1–1.0)
Monocytes Relative: 8 % (ref 3–12)

## 2010-04-14 LAB — COMPREHENSIVE METABOLIC PANEL
ALT: 23 U/L (ref 0–35)
AST: 28 U/L (ref 0–37)
Albumin: 3 g/dL — ABNORMAL LOW (ref 3.5–5.2)
Alkaline Phosphatase: 163 U/L — ABNORMAL HIGH (ref 39–117)
Chloride: 111 mEq/L (ref 96–112)
GFR calc Af Amer: 10 mL/min — ABNORMAL LOW (ref >60)
Potassium: 4.2 mEq/L (ref 3.5–5.1)
Sodium: 142 mEq/L (ref 135–145)
Total Bilirubin: 0.4 mg/dL (ref 0.3–1.2)
Total Protein: 6.8 g/dL (ref 6.0–8.3)

## 2010-04-14 LAB — PTH, INTACT AND CALCIUM: Calcium, Total (PTH): 7.5 mg/dL — ABNORMAL LOW (ref 8.4–10.5)

## 2010-04-14 LAB — POCT I-STAT, CHEM 8
Calcium, Ion: 0.84 mmol/L — ABNORMAL LOW (ref 1.12–1.32)
Chloride: 114 mEq/L — ABNORMAL HIGH (ref 96–112)
Creatinine, Ser: 5.5 mg/dL — ABNORMAL HIGH (ref 0.4–1.2)
Glucose, Bld: 120 mg/dL — ABNORMAL HIGH (ref 70–99)
HCT: 36 % (ref 36.0–46.0)
Hemoglobin: 12.2 g/dL (ref 12.0–15.0)
Potassium: 4.1 mEq/L (ref 3.5–5.1)

## 2010-04-14 LAB — CBC
HCT: 33.6 % — ABNORMAL LOW (ref 36.0–46.0)
HCT: 31.1 % — ABNORMAL LOW (ref 36.0–46.0)
MCHC: 32.2 g/dL (ref 30.0–36.0)
MCV: 86.2 fL (ref 78.0–100.0)
Platelets: 195 10*3/uL (ref 150–400)
Platelets: 206 10*3/uL (ref 150–400)
RDW: 19.6 % — ABNORMAL HIGH (ref 11.5–15.5)
WBC: 7.3 10*3/uL (ref 4.0–10.5)

## 2010-04-14 LAB — CK TOTAL AND CKMB (NOT AT ARMC)
CK, MB: 3.6 ng/mL (ref 0.3–4.0)
CK, MB: 4.8 ng/mL — ABNORMAL HIGH (ref 0.3–4.0)
Relative Index: 1.6 (ref 0.0–2.5)
Total CK: 270 U/L — ABNORMAL HIGH (ref 7–177)

## 2010-04-14 LAB — POCT CARDIAC MARKERS
CKMB, poc: 3.6 ng/mL (ref 1.0–8.0)
Troponin i, poc: <0.05 ng/mL (ref 0.00–0.09)

## 2010-04-14 LAB — D-DIMER, QUANTITATIVE: D-Dimer, Quant: 1.59 ug/mL-FEU — ABNORMAL HIGH (ref 0.00–0.48)

## 2010-04-14 LAB — TROPONIN I: Troponin I: 0.06 ng/mL (ref 0.00–0.06)

## 2010-04-15 LAB — CBC
Hemoglobin: 9.6 g/dL — ABNORMAL LOW (ref 12.0–15.0)
MCHC: 32 g/dL (ref 30.0–36.0)
RBC: 3.52 MIL/uL — ABNORMAL LOW (ref 3.87–5.11)
RDW: 19.9 % — ABNORMAL HIGH (ref 11.5–15.5)

## 2010-04-15 LAB — IRON AND TIBC
Iron: 33 ug/dL — ABNORMAL LOW (ref 42–135)
Saturation Ratios: 15 % — ABNORMAL LOW (ref 20–55)
TIBC: 223 ug/dL — ABNORMAL LOW (ref 250–470)

## 2010-04-15 LAB — RENAL FUNCTION PANEL
BUN: 74 mg/dL — ABNORMAL HIGH (ref 6–23)
CO2: 21 mEq/L (ref 19–32)
Calcium: 7.3 mg/dL — ABNORMAL LOW (ref 8.4–10.5)
Chloride: 113 mEq/L — ABNORMAL HIGH (ref 96–112)
Creatinine, Ser: 4.99 mg/dL — ABNORMAL HIGH (ref 0.4–1.2)

## 2010-04-15 LAB — POCT HEMOGLOBIN-HEMACUE: Hemoglobin: 10.4 g/dL — ABNORMAL LOW (ref 12.0–15.0)

## 2010-04-16 LAB — CBC
HCT: 29.2 % — ABNORMAL LOW (ref 36.0–46.0)
Hemoglobin: 9.5 g/dL — ABNORMAL LOW (ref 12.0–15.0)
MCHC: 32.5 g/dL (ref 30.0–36.0)
MCV: 84.7 fL (ref 78.0–100.0)
RBC: 3.45 MIL/uL — ABNORMAL LOW (ref 3.87–5.11)
RDW: 20.4 % — ABNORMAL HIGH (ref 11.5–15.5)

## 2010-04-16 LAB — RENAL FUNCTION PANEL
Albumin: 2.8 g/dL — ABNORMAL LOW (ref 3.5–5.2)
CO2: 19 mEq/L (ref 19–32)
Chloride: 111 mEq/L (ref 96–112)
Creatinine, Ser: 4.82 mg/dL — ABNORMAL HIGH (ref 0.4–1.2)
GFR calc Af Amer: 11 mL/min — ABNORMAL LOW (ref >60)
GFR calc non Af Amer: 9 mL/min — ABNORMAL LOW (ref >60)
Potassium: 4.7 mEq/L (ref 3.5–5.1)
Sodium: 142 mEq/L (ref 135–145)

## 2010-04-16 LAB — FERRITIN: Ferritin: 356 ng/mL — ABNORMAL HIGH (ref 10–291)

## 2010-04-16 LAB — IRON AND TIBC: TIBC: 213 ug/dL — ABNORMAL LOW (ref 250–470)

## 2010-04-17 LAB — RENAL FUNCTION PANEL
CO2: 18 mEq/L — ABNORMAL LOW (ref 19–32)
Chloride: 114 mEq/L — ABNORMAL HIGH (ref 96–112)
Creatinine, Ser: 4.97 mg/dL — ABNORMAL HIGH (ref 0.4–1.2)
GFR calc Af Amer: 11 mL/min — ABNORMAL LOW (ref >60)
GFR calc non Af Amer: 9 mL/min — ABNORMAL LOW (ref >60)
Glucose, Bld: 121 mg/dL — ABNORMAL HIGH (ref 70–99)
Sodium: 143 mEq/L (ref 135–145)

## 2010-04-17 LAB — POCT HEMOGLOBIN-HEMACUE: Hemoglobin: 9.4 g/dL — ABNORMAL LOW (ref 12.0–15.0)

## 2010-04-17 LAB — IRON AND TIBC
Iron: 32 ug/dL — ABNORMAL LOW (ref 42–135)
TIBC: 226 ug/dL — ABNORMAL LOW (ref 250–470)
UIBC: 194 ug/dL

## 2010-04-17 LAB — CBC
MCV: 84.2 fL (ref 78.0–100.0)
Platelets: 263 10*3/uL (ref 150–400)
WBC: 8.1 10*3/uL (ref 4.0–10.5)

## 2010-04-18 LAB — POCT HEMOGLOBIN-HEMACUE: Hemoglobin: 9 g/dL — ABNORMAL LOW (ref 12.0–15.0)

## 2010-04-19 LAB — RENAL FUNCTION PANEL
CO2: 18 mEq/L — ABNORMAL LOW (ref 19–32)
Chloride: 109 mEq/L (ref 96–112)
Creatinine, Ser: 4.83 mg/dL — ABNORMAL HIGH (ref 0.4–1.2)
GFR calc Af Amer: 11 mL/min — ABNORMAL LOW (ref >60)
GFR calc non Af Amer: 9 mL/min — ABNORMAL LOW (ref >60)
Glucose, Bld: 152 mg/dL — ABNORMAL HIGH (ref 70–99)
Sodium: 137 mEq/L (ref 135–145)

## 2010-04-19 LAB — IRON AND TIBC: UIBC: 209 ug/dL

## 2010-04-19 LAB — CBC
Hemoglobin: 9.1 g/dL — ABNORMAL LOW (ref 12.0–15.0)
MCV: 85.2 fL (ref 78.0–100.0)
RBC: 3.29 MIL/uL — ABNORMAL LOW (ref 3.87–5.11)
WBC: 8.9 10*3/uL (ref 4.0–10.5)

## 2010-04-20 LAB — COMPREHENSIVE METABOLIC PANEL
ALT: 23 U/L (ref 0–35)
Alkaline Phosphatase: 166 U/L — ABNORMAL HIGH (ref 39–117)
Glucose, Bld: 118 mg/dL — ABNORMAL HIGH (ref 70–99)
Potassium: 4.6 mEq/L (ref 3.5–5.1)
Sodium: 141 mEq/L (ref 135–145)
Total Protein: 7.2 g/dL (ref 6.0–8.3)

## 2010-04-20 LAB — GLUCOSE, CAPILLARY
Glucose-Capillary: 100 mg/dL — ABNORMAL HIGH (ref 70–99)
Glucose-Capillary: 102 mg/dL — ABNORMAL HIGH (ref 70–99)
Glucose-Capillary: 105 mg/dL — ABNORMAL HIGH (ref 70–99)
Glucose-Capillary: 118 mg/dL — ABNORMAL HIGH (ref 70–99)
Glucose-Capillary: 128 mg/dL — ABNORMAL HIGH (ref 70–99)
Glucose-Capillary: 131 mg/dL — ABNORMAL HIGH (ref 70–99)
Glucose-Capillary: 152 mg/dL — ABNORMAL HIGH (ref 70–99)
Glucose-Capillary: 157 mg/dL — ABNORMAL HIGH (ref 70–99)
Glucose-Capillary: 165 mg/dL — ABNORMAL HIGH (ref 70–99)

## 2010-04-20 LAB — VITAMIN B12: Vitamin B-12: 515 pg/mL (ref 211–911)

## 2010-04-20 LAB — CARDIAC PANEL(CRET KIN+CKTOT+MB+TROPI)
CK, MB: 6.3 ng/mL — ABNORMAL HIGH (ref 0.3–4.0)
CK, MB: 8.2 ng/mL — ABNORMAL HIGH (ref 0.3–4.0)
Relative Index: 1.3 (ref 0.0–2.5)
Relative Index: 1.5 (ref 0.0–2.5)
Total CK: 497 U/L — ABNORMAL HIGH (ref 7–177)
Total CK: 539 U/L — ABNORMAL HIGH (ref 7–177)
Troponin I: 0.03 ng/mL (ref 0.00–0.06)
Troponin I: 0.03 ng/mL (ref 0.00–0.06)

## 2010-04-20 LAB — RETICULOCYTES: Retic Ct Pct: 2.7 % (ref 0.4–3.1)

## 2010-04-20 LAB — HEMOGLOBIN A1C: Mean Plasma Glucose: 128 mg/dL

## 2010-04-20 LAB — LIPID PANEL
Cholesterol: 170 mg/dL (ref 0–200)
HDL: 34 mg/dL — ABNORMAL LOW (ref >39)
LDL Cholesterol: 116 mg/dL — ABNORMAL HIGH (ref 0–99)
Total CHOL/HDL Ratio: 5 RATIO
Triglycerides: 98 mg/dL (ref <150)

## 2010-04-20 LAB — RENAL FUNCTION PANEL
Albumin: 2.9 g/dL — ABNORMAL LOW (ref 3.5–5.2)
BUN: 75 mg/dL — ABNORMAL HIGH (ref 6–23)
Calcium: 6.6 mg/dL — ABNORMAL LOW (ref 8.4–10.5)
Creatinine, Ser: 4.1 mg/dL — ABNORMAL HIGH (ref 0.4–1.2)
Phosphorus: 4.8 mg/dL — ABNORMAL HIGH (ref 2.3–4.6)

## 2010-04-20 LAB — BRAIN NATRIURETIC PEPTIDE: Pro B Natriuretic peptide (BNP): 403 pg/mL — ABNORMAL HIGH (ref 0.0–100.0)

## 2010-04-20 LAB — CBC
HCT: 27.6 % — ABNORMAL LOW (ref 36.0–46.0)
Hemoglobin: 9.1 g/dL — ABNORMAL LOW (ref 12.0–15.0)
Hemoglobin: 9.5 g/dL — ABNORMAL LOW (ref 12.0–15.0)
MCV: 86.4 fL (ref 78.0–100.0)
RBC: 3.3 MIL/uL — ABNORMAL LOW (ref 3.87–5.11)
RDW: 17.2 % — ABNORMAL HIGH (ref 11.5–15.5)
WBC: 7.3 10*3/uL (ref 4.0–10.5)
WBC: 8.8 10*3/uL (ref 4.0–10.5)

## 2010-04-20 LAB — PROTIME-INR: Prothrombin Time: 14.2 seconds (ref 11.6–15.2)

## 2010-04-20 LAB — IRON AND TIBC
Iron: 56 ug/dL (ref 42–135)
Saturation Ratios: 17 % — ABNORMAL LOW (ref 20–55)
UIBC: 213 ug/dL

## 2010-04-20 LAB — PTH, INTACT AND CALCIUM
Calcium, Total (PTH): 6.6 mg/dL — ABNORMAL LOW (ref 8.4–10.5)
PTH: 478.3 pg/mL — ABNORMAL HIGH (ref 14.0–72.0)

## 2010-04-20 LAB — FERRITIN: Ferritin: 303 ng/mL — ABNORMAL HIGH (ref 10–291)

## 2010-04-20 LAB — FOLATE: Folate: 8.5 ng/mL

## 2010-04-21 LAB — URINALYSIS, ROUTINE W REFLEX MICROSCOPIC
Bilirubin Urine: NEGATIVE
Glucose, UA: NEGATIVE mg/dL
Ketones, ur: NEGATIVE mg/dL
Nitrite: NEGATIVE
Protein, ur: >300 mg/dL — AB
Specific Gravity, Urine: 1.01 (ref 1.005–1.030)
Urobilinogen, UA: 0.2 mg/dL (ref 0.0–1.0)
pH: 6 (ref 5.0–8.0)

## 2010-04-21 LAB — CK TOTAL AND CKMB (NOT AT ARMC)
CK, MB: 7 ng/mL — ABNORMAL HIGH (ref 0.3–4.0)
Relative Index: 1.4 (ref 0.0–2.5)
Total CK: 513 U/L — ABNORMAL HIGH (ref 7–177)

## 2010-04-21 LAB — POCT CARDIAC MARKERS
CKMB, poc: 7 ng/mL (ref 1.0–8.0)
Myoglobin, poc: >500 ng/mL (ref 12–200)
Troponin i, poc: <0.05 ng/mL (ref 0.00–0.09)

## 2010-04-21 LAB — URINE MICROSCOPIC-ADD ON

## 2010-04-21 LAB — BASIC METABOLIC PANEL
BUN: 65 mg/dL — ABNORMAL HIGH (ref 6–23)
CO2: 16 mEq/L — ABNORMAL LOW (ref 19–32)
Calcium: 6.8 mg/dL — ABNORMAL LOW (ref 8.4–10.5)
Chloride: 111 mEq/L (ref 96–112)
Creatinine, Ser: 4.03 mg/dL — ABNORMAL HIGH (ref 0.4–1.2)
GFR calc Af Amer: 14 mL/min — ABNORMAL LOW (ref >60)
GFR calc non Af Amer: 11 mL/min — ABNORMAL LOW (ref >60)
Glucose, Bld: 135 mg/dL — ABNORMAL HIGH (ref 70–99)
Potassium: 4.2 mEq/L (ref 3.5–5.1)
Sodium: 137 mEq/L (ref 135–145)

## 2010-04-21 LAB — CBC
Hemoglobin: 8.3 g/dL — ABNORMAL LOW (ref 12.0–15.0)
MCHC: 32 g/dL (ref 30.0–36.0)
MCV: 87.8 fL (ref 78.0–100.0)
Platelets: 239 10*3/uL (ref 150–400)
RBC: 3.46 MIL/uL — ABNORMAL LOW (ref 3.87–5.11)
RBC: 2.89 MIL/uL — ABNORMAL LOW (ref 3.87–5.11)

## 2010-04-21 LAB — GLUCOSE, CAPILLARY
Glucose-Capillary: 86 mg/dL (ref 70–99)
Glucose-Capillary: 71 mg/dL (ref 70–99)
Glucose-Capillary: 96 mg/dL (ref 70–99)

## 2010-04-21 LAB — DIFFERENTIAL
Basophils Absolute: 0 10*3/uL (ref 0.0–0.1)
Basophils Relative: 0 % (ref 0–1)
Eosinophils Absolute: 0 10*3/uL (ref 0.0–0.7)
Neutro Abs: 8.1 10*3/uL — ABNORMAL HIGH (ref 1.7–7.7)
Neutrophils Relative %: 90 % — ABNORMAL HIGH (ref 43–77)

## 2010-04-21 LAB — RENAL FUNCTION PANEL
CO2: 18 mEq/L — ABNORMAL LOW (ref 19–32)
Glucose, Bld: 76 mg/dL (ref 70–99)
Phosphorus: 4.9 mg/dL — ABNORMAL HIGH (ref 2.3–4.6)
Potassium: 4.5 mEq/L (ref 3.5–5.1)
Sodium: 139 mEq/L (ref 135–145)

## 2010-04-21 LAB — POCT HEMOGLOBIN-HEMACUE: Hemoglobin: 8 g/dL — ABNORMAL LOW (ref 12.0–15.0)

## 2010-04-21 LAB — D-DIMER, QUANTITATIVE: D-Dimer, Quant: 3.27 ug/mL-FEU — ABNORMAL HIGH (ref 0.00–0.48)

## 2010-04-21 LAB — TROPONIN I: Troponin I: 0.02 ng/mL (ref 0.00–0.06)

## 2010-04-21 LAB — POCT I-STAT 4, (NA,K, GLUC, HGB,HCT)
Glucose, Bld: 86 mg/dL (ref 70–99)
Potassium: 4.4 mEq/L (ref 3.5–5.1)

## 2010-04-21 LAB — BRAIN NATRIURETIC PEPTIDE: Pro B Natriuretic peptide (BNP): 327 pg/mL — ABNORMAL HIGH (ref 0.0–100.0)

## 2010-04-25 LAB — RENAL FUNCTION PANEL
Albumin: 2.6 g/dL — ABNORMAL LOW (ref 3.5–5.2)
BUN: 82 mg/dL — ABNORMAL HIGH (ref 6–23)
Calcium: 7.7 mg/dL — ABNORMAL LOW (ref 8.4–10.5)
Glucose, Bld: 115 mg/dL — ABNORMAL HIGH (ref 70–99)
Phosphorus: 5.1 mg/dL — ABNORMAL HIGH (ref 2.3–4.6)
Sodium: 142 mEq/L (ref 135–145)

## 2010-04-25 LAB — CBC
HCT: 31.9 % — ABNORMAL LOW (ref 36.0–46.0)
MCV: 86.7 fL (ref 78.0–100.0)
RBC: 3.68 MIL/uL — ABNORMAL LOW (ref 3.87–5.11)
WBC: 10.4 10*3/uL (ref 4.0–10.5)

## 2010-04-25 LAB — PTH, INTACT AND CALCIUM: PTH: 328.7 pg/mL — ABNORMAL HIGH (ref 14.0–72.0)

## 2010-04-26 LAB — CBC
HCT: 29 % — ABNORMAL LOW (ref 36.0–46.0)
Hemoglobin: 9.6 g/dL — ABNORMAL LOW (ref 12.0–15.0)
MCHC: 33 g/dL (ref 30.0–36.0)
Platelets: 245 10*3/uL (ref 150–400)
RDW: 15.8 % — ABNORMAL HIGH (ref 11.5–15.5)

## 2010-04-26 LAB — RENAL FUNCTION PANEL
Albumin: 2.7 g/dL — ABNORMAL LOW (ref 3.5–5.2)
BUN: 68 mg/dL — ABNORMAL HIGH (ref 6–23)
Calcium: 7.8 mg/dL — ABNORMAL LOW (ref 8.4–10.5)
Creatinine, Ser: 3.97 mg/dL — ABNORMAL HIGH (ref 0.4–1.2)
Glucose, Bld: 106 mg/dL — ABNORMAL HIGH (ref 70–99)
Phosphorus: 4.5 mg/dL (ref 2.3–4.6)

## 2010-04-29 NOTE — Progress Notes (Signed)
Patient was a no show for this appointment, [facility] will no longer see this patient due to multiple no-shows.

## 2010-05-12 ENCOUNTER — Ambulatory Visit (INDEPENDENT_AMBULATORY_CARE_PROVIDER_SITE_OTHER): Payer: Medicare Other

## 2010-05-12 DIAGNOSIS — N186 End stage renal disease: Secondary | ICD-10-CM

## 2010-05-13 NOTE — Assessment & Plan Note (Signed)
OFFICE VISIT  [name], [name] DOB:  01/02/1946                                       05/12/2010 ZOXWR#:[id number]  DATE OF SURGERY:  March 25, 2010.  HISTORY OF PRESENT ILLNESS:  This is a 65 year old black female who has end-stage renal disease and is on hemodialysis.  She previously had a left forearm AV graft placed in 2010 by Dr. Edilia Bo.  Because of the high venous pressures, the patient recently had a shuntogram on March 1 which Dr. Hart Rochester reviewed.  The graft was patent, but the venous outflow was totally occluded with a circuitous filling by collateral circulation of the left basilic vein, which is a large vein and empties into the subclavian vein centrally with no central vein stenosis.  The patient was then referred for vascular access evaluation.  On March 25, 2010, she was taken to the operating room where she underwent ligation of her left forearm AV graft, and a creation of a left basilic vein to brachial artery AV fistula (basilic vein transposition) was performed and insertion of Diatek catheter via the right jugular vein.  Patient presents today for her 6-week followup.  Her fistula is progressing nicely, and she has a good thrill.  She has no evidence of steal syndrome, and she has 2+ radial pulse in the left arm.  However, she does have a slight dehiscence of one of her incisions, for which she states she has been pouring peroxide on this.  She was advised to stop the peroxide and was given a prescription for hydrogel to apply a thin layer twice daily and dress with a dry dressing.  ASSESSMENT:  This is a 65 year old black female with end-stage renal disease who underwent a basilic vein transposition on March 25, 2010.  PLAN:  Patient is to continue hydrogel b.i.d. and return to see Dr. Hart Rochester in 2 weeks for wound check as well as progression of her AV fistula.  Cynthia Pigg, PA  Cynthia E. Fields, MD Electronically  Signed  SE/MEDQ  D:  05/12/2010  T:  05/13/2010  Job:  [id number]

## 2010-05-15 ENCOUNTER — Emergency Department (HOSPITAL_COMMUNITY): Payer: Medicare Other

## 2010-05-15 ENCOUNTER — Inpatient Hospital Stay (HOSPITAL_COMMUNITY)
Admission: EM | Admit: 2010-05-15 | Discharge: 2010-05-19 | DRG: 291 | Disposition: A | Discharge status: Home Health | Payer: Medicare Other | Attending: Hospitalist | Admitting: Hospitalist

## 2010-05-15 DIAGNOSIS — Z8673 Personal history of transient ischemic attack (TIA), and cerebral infarction without residual deficits: Secondary | ICD-10-CM

## 2010-05-15 DIAGNOSIS — I2589 Other forms of chronic ischemic heart disease: Secondary | ICD-10-CM | POA: Diagnosis present

## 2010-05-15 DIAGNOSIS — N186 End stage renal disease: Secondary | ICD-10-CM | POA: Diagnosis present

## 2010-05-15 DIAGNOSIS — I509 Heart failure, unspecified: Secondary | ICD-10-CM | POA: Diagnosis present

## 2010-05-15 DIAGNOSIS — I2510 Atherosclerotic heart disease of native coronary artery without angina pectoris: Secondary | ICD-10-CM | POA: Diagnosis present

## 2010-05-15 DIAGNOSIS — I120 Hypertensive chronic kidney disease with stage 5 chronic kidney disease or end stage renal disease: Secondary | ICD-10-CM | POA: Diagnosis present

## 2010-05-15 DIAGNOSIS — I5041 Acute combined systolic (congestive) and diastolic (congestive) heart failure: Principal | ICD-10-CM | POA: Diagnosis present

## 2010-05-15 DIAGNOSIS — N2581 Secondary hyperparathyroidism of renal origin: Secondary | ICD-10-CM | POA: Diagnosis present

## 2010-05-15 DIAGNOSIS — E119 Type 2 diabetes mellitus without complications: Secondary | ICD-10-CM | POA: Diagnosis present

## 2010-05-16 ENCOUNTER — Inpatient Hospital Stay (HOSPITAL_COMMUNITY): Payer: Medicare Other

## 2010-05-16 LAB — GLUCOSE, CAPILLARY
Glucose-Capillary: 210 mg/dL — ABNORMAL HIGH (ref 70–99)
Glucose-Capillary: 290 mg/dL — ABNORMAL HIGH (ref 70–99)
Glucose-Capillary: 149 mg/dL — ABNORMAL HIGH (ref 70–99)

## 2010-05-16 LAB — CBC
MCH: 28.9 pg (ref 26.0–34.0)
MCHC: 31.8 g/dL (ref 30.0–36.0)
MCV: 90.9 fL (ref 78.0–100.0)
Platelets: 295 10*3/uL (ref 150–400)
RBC: 3.53 MIL/uL — ABNORMAL LOW (ref 3.87–5.11)
RDW: 16 % — ABNORMAL HIGH (ref 11.5–15.5)

## 2010-05-16 LAB — CARDIAC PANEL(CRET KIN+CKTOT+MB+TROPI)
CK, MB: 4.6 ng/mL — ABNORMAL HIGH (ref 0.3–4.0)
Relative Index: 1.2 (ref 0.0–2.5)
Total CK: 469 U/L — ABNORMAL HIGH (ref 7–177)
Total CK: 602 U/L — ABNORMAL HIGH (ref 7–177)

## 2010-05-16 LAB — DIFFERENTIAL
Eosinophils Absolute: 0.1 10*3/uL (ref 0.0–0.7)
Eosinophils Relative: 2 % (ref 0–5)
Lymphs Abs: 1.5 10*3/uL (ref 0.7–4.0)
Monocytes Absolute: 1.1 10*3/uL — ABNORMAL HIGH (ref 0.1–1.0)
Monocytes Relative: 13 % — ABNORMAL HIGH (ref 3–12)

## 2010-05-16 LAB — PROTIME-INR: Prothrombin Time: 13.3 seconds (ref 11.6–15.2)

## 2010-05-16 LAB — HEMOGLOBIN A1C: Hgb A1c MFr Bld: 7.3 % — ABNORMAL HIGH (ref <5.7)

## 2010-05-16 LAB — BASIC METABOLIC PANEL
BUN: 60 mg/dL — ABNORMAL HIGH (ref 6–23)
Calcium: 8.5 mg/dL (ref 8.4–10.5)
Chloride: 95 mEq/L — ABNORMAL LOW (ref 96–112)
Creatinine, Ser: 8.64 mg/dL — ABNORMAL HIGH (ref 0.4–1.2)

## 2010-05-16 LAB — TSH: TSH: 0.636 u[IU]/mL (ref 0.350–4.500)

## 2010-05-16 LAB — POCT CARDIAC MARKERS: CKMB, poc: 3.4 ng/mL (ref 1.0–8.0)

## 2010-05-16 LAB — CK TOTAL AND CKMB (NOT AT ARMC): Total CK: 424 U/L — ABNORMAL HIGH (ref 7–177)

## 2010-05-17 ENCOUNTER — Inpatient Hospital Stay (HOSPITAL_COMMUNITY): Payer: Medicare Other

## 2010-05-17 LAB — BASIC METABOLIC PANEL
BUN: 45 mg/dL — ABNORMAL HIGH (ref 6–23)
CO2: 26 mEq/L (ref 19–32)
Calcium: 8.3 mg/dL — ABNORMAL LOW (ref 8.4–10.5)
Chloride: 94 mEq/L — ABNORMAL LOW (ref 96–112)
Creatinine, Ser: 6.79 mg/dL — ABNORMAL HIGH (ref 0.4–1.2)
GFR calc Af Amer: 7 mL/min — ABNORMAL LOW (ref >60)

## 2010-05-17 LAB — GLUCOSE, CAPILLARY
Glucose-Capillary: 184 mg/dL — ABNORMAL HIGH (ref 70–99)
Glucose-Capillary: 187 mg/dL — ABNORMAL HIGH (ref 70–99)

## 2010-05-18 ENCOUNTER — Inpatient Hospital Stay (HOSPITAL_COMMUNITY): Payer: Medicare Other

## 2010-05-18 LAB — RENAL FUNCTION PANEL
Albumin: 3.3 g/dL — ABNORMAL LOW (ref 3.5–5.2)
BUN: 44 mg/dL — ABNORMAL HIGH (ref 6–23)
Calcium: 9.1 mg/dL (ref 8.4–10.5)
Creatinine, Ser: 6.19 mg/dL — ABNORMAL HIGH (ref 0.4–1.2)
Glucose, Bld: 238 mg/dL — ABNORMAL HIGH (ref 70–99)
Phosphorus: 4.3 mg/dL (ref 2.3–4.6)
Potassium: 3.8 mEq/L (ref 3.5–5.1)

## 2010-05-18 LAB — CBC
HCT: 36.5 % (ref 36.0–46.0)
MCH: 28.4 pg (ref 26.0–34.0)
MCHC: 31.5 g/dL (ref 30.0–36.0)
MCV: 90.1 fL (ref 78.0–100.0)
Platelets: 352 10*3/uL (ref 150–400)
RDW: 15.7 % — ABNORMAL HIGH (ref 11.5–15.5)

## 2010-05-18 LAB — GLUCOSE, CAPILLARY
Glucose-Capillary: 280 mg/dL — ABNORMAL HIGH (ref 70–99)
Glucose-Capillary: 352 mg/dL — ABNORMAL HIGH (ref 70–99)

## 2010-05-24 ENCOUNTER — Ambulatory Visit (INDEPENDENT_AMBULATORY_CARE_PROVIDER_SITE_OTHER): Payer: Medicare Other | Admitting: Vascular Surgery

## 2010-05-24 DIAGNOSIS — N186 End stage renal disease: Secondary | ICD-10-CM

## 2010-05-24 NOTE — H&P (Signed)
[name], [name] NO.:  [id number]  MEDICAL RECORD NO.:  [id number]           PATIENT TYPE:  E  LOCATION:  [facility]                         FACILITY:  [facility]  PHYSICIAN:  Houston Siren, MD           DATE OF BIRTH:  Oct 05, 1945  DATE OF ADMISSION:  05/15/2010 DATE OF DISCHARGE:                             HISTORY & PHYSICAL   PRIMARY CARE PHYSICIAN:  Georgina Quint. Plotnikov, MD  CARDIOLOGIST:  Duke Salvia, MD, [facility] and Jesse Sans. Daleen Squibb, MD, [facility]  NEPHROLOGIST:  Cecille Aver, MD  ENDOCRINOLOGIST:  Cleophas Dunker Everardo All, MD  REASON FOR ADMISSION:  Shortness of breath.  HISTORY OF PRESENT ILLNESS:  This is a 65 year old female with history of morbid obesity, end-stage renal disease on chronic hemodialysis (Dr. Annie Sable, Monday, Wednesday, Friday), ischemic cardiomyopathy with 25% ejection fraction, known coronary artery disease, anemia, atrial tachycardia on amiodarone but not anticoagulated, secondaryhyperparathyroidism, type 2 diabetes, history of colon cancer diagnosed in 1986, presents with increasing shortness of breath, orthopnea, and wheezing for the past few days with no chest pain, fever, chills, or cough.  She said that she was cutting her grass and felt wheezy.  In the emergency room, she was given several nebulizer treatments without improvement of her symptoms.  She was given p.o. prednisone without relief.  Her EKG showed a left bundle-branch block, and her white count was normal at 8500.  It was noted that her creatinine was 8.64 with potassium of 3.8.  Her chest x-ray showed "improving vascular congestion."  Her BNP came back 3000 with negative troponin of less than 0.05.  With respect to her anemia, her hemoglobin came back 10.2. Hospitalist was asked to admit the patient believing that this is her COPD exacerbation.  She has no history of COPD, never smoked, and has no history of asthma.  PAST MEDICAL HISTORY: 1. Recurrent urinary  tract infection. 2. History of colon cancer diagnosed in 1986. 3,  Ischemic cardiomyopathy with ejection fraction of 25% with nonobstructive coronary artery disease. 1. Diabetes type 2. 2. Morbid obesity. 3. End-stage renal disease.  Hemodialysis Monday, Wednesday, Friday,     Dr. Kathrene Bongo. 4. History of atrial tachycardia on amiodarone, not on     anticoagulation. 5. Secondary hyperparathyroidism. 6. Dyslipidemia.  ALLERGIES:  Dye and iodine.  SOCIAL HISTORY:  She denied tobacco, alcohol ,or illicit drug use.  CURRENT MEDICATIONS:  She is not able to complete.  According to discharge summary in January, she was on Plavix, Imdur, simvastatin, amiodarone, aspirin, diltiazem, Humalog, metoprolol, and PhosLo cap.  REVIEW OF SYSTEMS:  Otherwise unremarkable.  PHYSICAL EXAMINATION:  VITAL SIGNS:  Blood pressure 136/50, pulse of 110, respiratory rate of 26, temperature 98.2. GENERAL:  She is slightly tachypneic and appears slightly dyspneic.  She is able to lie flat however. HEENT:  She does have facial symmetry with fluent speech.  Tongue is midline.  No stridor.  She has audible wheezes bilaterally with bibasilar crackles. HEART:  S4 but no murmur or rub. ABDOMEN:  Quite obese, nondistended, nontender. EXTREMITIES:  Edema 1+.  No calf tenderness.  She has adequate pulses bilaterally. NEUROLOGIC:  Nonfocal.  OBJECTIVE FINDINGS:  Chest x-ray shows improving pulmonary vascular congestion compared to March 25, 2010, potassium of 3.8, glucose 84, creatinine 8.64, BUN of 60.  White count 8500, hemoglobin of 10.2, platelet count 295,000.  BNP of 2995 ( range 0-125), troponin less than 0.05.  EKG showed left bundle-branch block.  IMPRESSION:  This is a 65 year old with multiple medical problems including end-stage renal disease, ischemic cardiomyopathy, anemia, diabetes, who presented with shortness of breath and wheezing.  I suspect that this is congestive heart failure, and  that she has volume overload.  I do not think that this is her lung problem given that she was not a smoker and had no history of asthma or COPD.  It is also evident that her nebulizer treatments were only helping her minimally.  We will treat her with IV Lasix and nitroglycerin.  If she is not better, we will give morphine to reduce preload as well.  She will need oxygen.  I will give her p.o. prednisone in case she does have an allergic reaction to the grass that she states she cut a week ago.  She will need hemodialysis which is due today.  I have called and left messages at Dr. Jon Gills office so she can resume her dialysis.  We will continue all her medications.  We will admit her to [address] [facility]. She is a full code.  We will rule out with serial CPKs and troponins as well.     Houston Siren, MD     PL/MEDQ  D:  05/16/2010  T:  05/16/2010  Job:  [id number]  cc:   Georgina Quint. Plotnikov, MD  Electronically Signed by Houston Siren  on 05/24/2010 03:45:53 AM

## 2010-05-24 NOTE — Procedures (Signed)
CEPHALIC VEIN MAPPING   INDICATION:  Cephalic vein mapping prior to placement of dialysis  access.   HISTORY:   EXAM:   The right forearm cephalic vein is compressible.   Diameter measurements range from 0.21 cm to 0.33 cm.   The left upper arm cephalic vein is compressible.   Diameter measurements range from 0.26 to 0.08 cm.   See attached worksheet for all measurements.   IMPRESSION:  1. The forearm portion of the right cephalic vein is of acceptable      diameter for use as a fistula.  2. The upper arm portion of the left cephalic vein is of acceptable      caliber for use as a fistula.   ___________________________________________  Di Kindle. Edilia Bo, M.D.   MC/MEDQ  D:  12/03/2007  T:  12/03/2007  Job:  [id number]

## 2010-05-24 NOTE — Consult Note (Signed)
VASCULAR SURGERY CONSULTATION   [name], [name]  DOB:  [birth year]                                       12/03/2007  ZOXWR#:[id number]   I saw the patient in the office today in consultation for hemodialysis  access.  This is a pleasant 65 year old woman with end-stage renal  disease secondary to hypertension and diabetes.  She was sent for  evaluation for access.  Apparently Dr. Eliott Nine has had problems with her  refusing to be evaluated for access in the past but finally she was  agreeable.  She is not yet on dialysis.  She is right handed.   PAST MEDICAL HISTORY:  Significant for chronic kidney disease stage IV,  hypertension, type 2 diabetes, dyslipidemia, anemia, chronic left bundle  branch block, history of colon cancer status post partial resection with  colostomy.   MEDICATIONS AND REVIEW OF SYSTEMS:  Documented on the medical history  form in her chart.   PHYSICAL EXAMINATION:  This is a pleasant 65 year old woman who appears  her stated age.  Her blood pressure is 174/98, heart rate is 78 regular  rate and rhythm.  She has palpable brachial and radial pulse  bilaterally.   Vein mapping shows that her forearm cephalic vein on the left is not  usable and quite small, and the left upper extremity cephalic vein is  marginal in size with diameters ranging from 0.21-0.26 cm.   I have recommended we explore upper arm cephalic vein on the left and if  this is adequate, place an upper arm AV fistula.  If this is not  adequate, then we would place an AV graft.  We have discussed the  indications for surgery and potential complications including but not  limited to failure of the fistula to mature, graft thrombosis, graft  infection, steal syndrome, bleeding, arm swelling, and wound healing  problems.  All of her questions were answered.  She is currently not  agreeable to schedule surgery and is thinking that she would prefer wait  until January.  Hopefully,  she will call to make these arrangements.   Di Kindle. Edilia Bo, M.D.  Electronically Signed  CSD/MEDQ  D:  12/03/2007  T:  12/04/2007  Job:  [id number]   cc:   Duke Salvia. Eliott Nine, M.D.

## 2010-05-24 NOTE — Op Note (Signed)
[name], [name]               ACCOUNT NO.:  [id number]   MEDICAL RECORD NO.:  [id number]          PATIENT TYPE:  AMB   LOCATION:  SDS                          FACILITY:  [facility]   PHYSICIAN:  Di Kindle. Edilia Bo, M.D.DATE OF BIRTH:  1945-06-22   DATE OF PROCEDURE:  03/11/2008  DATE OF DISCHARGE:                               OPERATIVE REPORT   PREOPERATIVE DIAGNOSIS:  Chronic kidney disease.   POSTOPERATIVE DIAGNOSIS:  Chronic kidney disease.   PROCEDURE:  Placement of new left forearm arteriovenous graft.   SURGEON:  Di Kindle. Edilia Bo, MD   ASSISTANT:  Jerold Coombe, PA   ANESTHESIA:  Local with sedation.   TECHNIQUE:  The patient was taken to the operating room and sedated by  Anesthesia.  The left upper extremity was prepped and draped in usual  sterile fashion.  Previous vein mapping demonstrated that the forearm  cephalic vein was minuscule and not usable for a fistula.  The upper arm  cephalic vein was small in size.  I did interrogate with a Doppler again  and not only did the upper arm cephalic vein looked quite small but it  was also quite deep.  I thought it was worth exploring to be sure she  was not a candidate for an upper arm fistula.  After the skin was  anesthetized, a transverse incision was made just below the antecubital  level and here the cephalic vein was dissected free.  It was a very  small vein and it was not felt to be adequate for a fistula.  The  brachial artery and brachial veins were dissected free beneath the  fascia.  Using one distal counterincision, a 4-7 mm graft was then  tunneled in a loop fashion in the forearm with the arterial aspect of  the graft along the radial aspect of the forearm in the venous aspect of  the graft along the ulnar aspect of the forearm.  Of note, upon  interrogating the basilic vein above the antecubital level  preoperatively, this did appear to be a good-sized vein also for future  reference.  This  is why I elected to tunnel the venous limb of the graft  along the ulnar aspect of the forearm.  The patient was then  heparinized.  The brachial artery was clamped proximally and distally  and a longitudinal arteriotomy was made.  A segment of the 4 mm into the  graft was excised.  The graft was spatulated and sewn end to side to the  artery using continuous 6-0 Prolene suture.  The graft was then pulled  to the appropriate length for anastomosis to the brachial vein.  The  vein was ligated distally and spatulated proximally.  It took a 4.5 mm  dilator and irrigated fairly easily with heparinized saline.  The venous  limb of the graft was cut to appropriate length, spatulated and sewn end-  to-end to the vein using continuous 6-0 Prolene suture.  At the  completion, there was an excellent thrill in the graft and also a  palpable radial  pulse.  Hemostasis was obtained  in the wounds.  The wounds were closed  with a deep layer of 3-0 Vicryl and the skin closed with 4-0 Vicryl.  Sterile dressing was applied.  The patient tolerated the procedure well  and was transferred to the recovery room in satisfactory condition.  All  needle and sponge counts were correct.      Di Kindle. Edilia Bo, M.D.  Electronically Signed     CSD/MEDQ  D:  03/11/2008  T:  03/12/2008  Job:  [id number]

## 2010-05-24 NOTE — Discharge Summary (Signed)
[name], [name]               ACCOUNT NO.:  [id number]   MEDICAL RECORD NO.:  [id number]          PATIENT TYPE:  INP   LOCATION:  4708                         FACILITY:  [facility]   PHYSICIAN:  Barbette Hair. Artist Pais, DO      DATE OF BIRTH:  06-05-1945   DATE OF ADMISSION:  04/09/2008  DATE OF DISCHARGE:  04/10/2008                               DISCHARGE SUMMARY   DISCHARGE DIAGNOSES:  1. Type 2 diabetes with hypoglycemic episode.  2. Mild volume overload/congestive heart failure.  3. Hypertension.  4. Chronic renal insufficiency, stage IV.  5. Gastroesophageal reflux disease.  6. Hyperlipidemia.   DISCHARGE MEDICATIONS:  1. Catapres 0.1 mg once daily.  2. Metoprolol 50 mg twice daily.  3. Lasix 40 mg 2 tablets daily.  4. Ceftin 500 mg twice a day x5 days.  5. NovoLog 5 units 3 times a day before meals.  6. Calcitriol 0.25 mcg 1 tablet 3 times a week, Monday, Wednesday, and      Friday.  7. Aspirin 325 mg once daily.   FOLLOWUP INSTRUCTIONS:  The patient advised to call Dr. George Hugh office  if blood sugar less than 60 or greater than 300.  She will call Dr.  Loren Racer office for followup appointment within 1 week.  She will  contact PCP if she experiences chest pain or shortness of breath.   HOSPITAL COURSE:  The patient is a 65 year old African American female  with history of morbid obesity, type 2 diabetes, and chronic renal  insufficiency who presents with hypoglycemia.  Her blood sugar was 40 on  admission.  While undergoing evaluation in the emergency department, the  patient noted to have bilateral leg edema and shortness of breath that  has been going on for the last 2 weeks.  Her D-dimer was elevated which  led to V/Q scan.  This was unremarkable.  The patient was admitted for  IV diuresis and further workup.  Bilateral lower extremity venous  Doppler was performed which was negative for DVT, SVT, or Baker cyst.  She responded well to IV Lasix.  Her respiratory had  returned to  baseline.   Her 70/30 insulin was held.  Her blood sugars remained in the low 100s.  Unclear if worsening renal insufficiency contributing to hypoglycemia.  The patient advised to discontinue 70/30 and start NovoLog 5 units  before meals.  She will follow up with Dr. Everardo All for further insulin  titration.   She responded well to IV diuresis.  The patient's Lasix increased to 80  mg once daily.  She will need follow up basic metabolic profile at  follow up visit with Dr. Posey Rea.   Chronic renal insufficiency - admitting physician note's creatinine was  at baseline.  Patient advised to follow up with her nephrologist.   The patient was found to have pyuria.  She was treated with Rocephin  during hospitalization and discharged with 5-day course of cefuroxime  500 mg twice daily.   LABORATORY DATA:  CBC showed WBC 8.8, H and H of 9.5/28.8, and platelet  count of 273.  INR was 1.1  and PTT was 27.  Blood sugar on discharge was  157.  Basic metabolic profile showed sodium 141, potassium 4.6, chloride  106, CO2 of 19, blood sugar of 118, BUN 62, creatinine of 3.95, total  bili 0.5, alk phos 166, AST 117, ALT 23, total protein 7.2, albumin 3.0,  and serum calcium 7.2.  Cardiac enzymes were negative x3.  BNP was 403.  TSH 2.014.  Lipid panel showed total cholesterol 170, triglyceride 98,  HDL 34, and LDL 116.  Total iron 56, TIBC 276, percent saturation 20%,  B12 of 515, folate 8.5, and ferritin 303.  UA; moderate blood and  moderate leukocytes.   CONDITION ON DISCHARGE:  Hypoglycemia had resolved and the patient's  respiratory status was at baseline.  She was felt medically stable for  discharge.      Barbette Hair. Artist Pais, DO  Electronically Signed     RDY/MEDQ  D:  04/10/2008  T:  04/11/2008  Job:  [id number]   cc:   Georgina Quint. Plotnikov, MD  Sean A. Everardo All, MD

## 2010-05-24 NOTE — Assessment & Plan Note (Signed)
[facility]                            CARDIOLOGY OFFICE NOTE   CALEYAH, JR                      MRN:          [id number]  DATE:05/09/2007                            DOB:          August 25, 1945    Ms. Steury comes in today for further manage of her left bundle branch  block and multiple cardiac risk factors.   Since last year, she has really no specific complaints other than just  fatigue.  Her big concern is her kidneys continue to deteriorate.   She denies orthopnea, PND or peripheral edema.  She has had no chest  discomfort.  She does have some mild dyspnea on exertion.   She is followed by Dr. Sharen Heck for her hypertension, sees Dr.  Posey Rea for her primary care and also Dr. Everardo All for her diabetes.   She says her blood pressure usually runs around 140 over about 80 and  that her medicines have not been adjusted in a while.  Looking back at  her lab data, her hemoglobin A1c was last 8.7%.   CURRENT MEDICATIONS:  1. Toprol XL 200 mg a day.  2. Enteric-coated aspirin 325 a day.  3. Insulin.  4. Furosemide 40 mg a day.  5. Vitamin D.  6. Calcitriol.  7. Sodium bicarb.   PHYSICAL EXAMINATION:  VITAL SIGNS:  Her blood pressure 162/86, her  pulse is 78 and regular.  Her weight is up 14 pounds from last year to  229.  GENERAL APPEARANCE:  She is in no acute distress.  SKIN:  Warm and dry.  HEENT:  Sclerae are clear.  Facial symmetry is normal.  PERLA.  Extraocular movements are intact.  NECK:  Supple.  Carotid upstrokes are equal bilateral without bruits, no  JVD.  Thyroid is not enlarged.  Trachea is midline.  LUNGS:  Clear to auscultation.  HEART:  Reveals a poorly appreciated PMI.  She has a normal S1,  paradoxically split S2.  There is no obvious gallop.  ABDOMEN:  Obese with good bowel sounds, organomegaly could not be  assessed.  EXTREMITIES:  No edema.  Pulses are present.  NEURO:  Exam is intact.  Skin is much  younger than stated age.   Her EKG shows her left bundle.  She is in sinus rhythm.   I had a long talk with Ms. Penn today.  I am not sure why she is not  on a Statin, but when carefully questioned, she said that  she took  Lipitor in the past because a man she knew hair fell out!  I have talked  to her about secondary prevention today in terms of treating her lipids  not to mention tightening of her blood sugar and her blood pressure.   PLAN:  1. Comprehensive metabolic panel and fasting lipids.  2. A 2-D echocardiogram to reassess LV function and LVH.  Her last      echo was in [place] in 2002 per my records.     Thomas C. Daleen Squibb, MD, [facility]  Electronically Signed    TCW/MedQ  DD: 05/09/2007  DT: 05/09/2007  Job #: [id number]

## 2010-05-24 NOTE — H&P (Signed)
NAMEDIAMOND, JENTZ               ACCOUNT NO.:  [id number]   MEDICAL RECORD NO.:  [id number]          PATIENT TYPE:  INP   LOCATION:  4708                         FACILITY:  [facility]   PHYSICIAN:  Michiel Cowboy, MDDATE OF BIRTH:  04/08/1945   DATE OF ADMISSION:  04/08/2008  DATE OF DISCHARGE:                              HISTORY & PHYSICAL   PRIMARY CARE Junnie Loschiavo:  Dr. Posey Rea.   ENDOCRINOLOGIST:  Dr. Everardo All.   NEPHROLOGIST:  Dr. Eliott Nine.   CARDIOLOGIST:  Dr. Algis Greenhouse.   CHIEF COMPLAINTS:  Hypoglycemia.   The patient is a 65 year old female with history of chronic renal  insufficiency, left bundle branch block, heart failure, diabetes.  The  patient reports that today she has been eating well and noticed that her  blood sugar was down to 40.  She called EMS, who brought her into the  emergency department.  At this point her blood sugar has improved to 80s  but while in ED, she was noted to have bilateral leg edema and shortness  of breath that she says has been going on for at least 2 weeks and  actually currently improving.  Her D-dimer was elevated.  VQ scan was  obtained, which was unremarkable.  Otherwise she denies any chest pain.  She was telling me her shortness of breath has been going on for 2  weeks.  It was gradual in onset and currently she feels better than she  did before.  She has not taken her medication today except for her  insulin.   Otherwise no fevers no chills.  No nausea, no vomiting, no diarrhea.  No  headache.  She otherwise has been feeling fairly well.   PAST MEDICAL HISTORY:  1. Heart failure.  2. Diabetes.  3. High cholesterol.  4. Hypertension.  5. Renal insufficiency.  6. GERD.   The patient does not smoke, does not abuse drugs.  Does not drink  alcohol.   FAMILY HISTORY:  Noncontributory.   ALLERGIES:  No known drug allergies.   MEDICATIONS:  1. Calcium.  2. She stopped taking her Lipitor for unknown reason.  3. Metoprolol.   She does not quite remember her dose but per records      it was 100 mg b.i.d.  4. Lasix 40 mg daily.  5. NovoLog 70/30 fifty units in the morning and 15 units q.h.s.   VITALS:  Temperature was documented as 94.2.  Upon recheck, temperature  is actually 98.6.  I expect that the first number was an error in  reading.  Blood pressure initially was 210/94, went down to 159/67 and  has been up and down throughout the ED stay.  Pulse 101, respirations  20, saturating 99% on 2 L.  The patient appears to be in no acute distress.  Head nontraumatic.  Moist mucous membranes.  LUNGS:  Occasional crackles but hard to evaluate given distant breath  sounds bilaterally.  ABDOMEN:  Soft, nontender, nondistended, obese.  HEART:  Rate regular rate and rhythm.  Somewhat rapid, also hard to hear  heart sounds secondary to distant heart sounds.  LOWER EXTREMITIES:  Edema 1+ up to the midcalf.  Bilateral and equal.  NEUROLOGIC:  The patient appears to be intact.  Strength 5/5 in all four  extremities.  SKIN:  Clean, dry and intact.   LABS:  White blood cell count 9.1, hemoglobin 9.7.  Sodium 137,  potassium 4.2, bicarb 16 and review of records appears to be her  baseline, creatinine 4.0.  D-dimer 3.27.  Cardiac markers:  Total CK  513, CK-MB 7.0 but troponin less than 0.02.  BNP 327.  UA showing 21-50  white blood cells.  V/Q scan showed low probability for PE.  Chest x-ray  showing heart failure.  EKG showing sinus tachycardia and left branch  block, which the patient has a history of.   ASSESSMENT/PLAN:  This is a 65 year old female with history of  congestive heart failure, presents with worsening congestive heart  failure control over the past 2 weeks and hypoglycemia.  1. Congestive heart failure.  We will observe the patient, give IV      Lasix to see if we can improve her symptomatically.  She has not      had an echocardiogram for some time.  We will check a 2-D echo to      see if there is  any worsening of it versus the patient is just      poorly compliant and that is why she is having troubles with      congestive heart failure exacerbation.  We will cycle cardiac      markers, place on telemetry and monitor.  2. Hypertension, also poorly-controlled, likely secondary to      noncompliance.  We will restart her metoprolol, which probably      should help tachycardia as well.  We will give p.r.n. hydralazine      and clonidine.  The patient's blood pressure has been spontaneously      improving while in the emergency department and seems to be worse      with exertion.  If she persists to have very high blood pressure      readings, may need to be transferred to step-down for a drip but      currently we will see if improves with p.r.n. dosage.  3. Hypoglycemia.  We will admit and hold her insulin for now.  Check      hemoglobin A1c, frequent Accu-Cheks.  4. Chronic renal insufficiency.  Currently at baseline.  We will      continue to watch.  5. Tachycardia, likely secondary to patient not taking her metoprolol      today with rebound tachycardia, but we will restart metoprolol.      Check TSH.  The patient does not have a pulmonary embolus per V/Q      scan but given elevated D-dimer, we will check Dopplers.  6. Diabetes.  Sliding scale insulin, hold 70/30 for now.  7. Left bundle branch block.  This is an old finding.  8. Prophylaxis.  Protonix plus Lovenox renally-dosed.    Dr. Rene Paci to assume care in a.m.      Michiel Cowboy, MD  Electronically Signed     AVD/MEDQ  D:  04/09/2008  T:  04/09/2008  Job:  [id number]

## 2010-05-24 NOTE — Assessment & Plan Note (Signed)
[facility]                            CARDIOLOGY OFFICE NOTE   [name], [name]                      MRN:          [id number]  DATE:06/13/2007                            DOB:          August 05, 1945    [name] comes in today for follow-up.  A 2-D echocardiogram showed  mild left ventricular hypertrophy with an EF of 50-55%.  She has mild  aortic valve regurgitation, mitral calcification with no significant  regurgitation, mild left atrial dilatation, and her pulmonary pressures  actually looked pretty good.   Her blood work showed a cholesterol of only 178, though her HDL was 25  and her LDL is 125.  With her risk factors, particularly diabetes, I  recommend a statin, which she has started in the form of Lipitor 10 mg a  day.   She had also a substantial change in her creatinine from 3.26-4.1, which  is being followed by Dr. Eliott Nine.  Her K was 4.1.  She saw Dr. Eliott Nine  yesterday.  Her LFTs were unremarkable, except for an albumin of 2.9.   She has no complaints today.   PHYSICAL EXAMINATION:  VITAL SIGNS:  Her blood pressure 166/87, her  pulse is 81 and regular.  Her weight is 232.  HEENT:  Unchanged.  NECK:  Carotids are equal bilateral bruits.  Thyroid is not enlarged.  Trachea is midline.  LUNGS:  Clear.  HEART:  Reveals a nondisplaced PMI.  She has a paradoxically split S2.  ABDOMEN:  Soft, good bowel sounds.  EXTREMITIES:  Reveal trace edema.  Pulses are intact.  NEURO:  Intact.   I have gone over the echocardiogram results and blood work with [name].  I have reinforced her she should stay on Lipitor 10 mg a day.  I have made no other changes in her program.  We will plan on seeing her  back in 6 months.  Dr. Eliott Nine has drawn blood and she will send the  results to me.  I will rely on her to continue to follow her blood work.     Thomas C. Daleen Squibb, MD, [facility]  Electronically Signed    TCW/MedQ  DD: 06/13/2007  DT: 06/13/2007   Job #: [id number]   cc:   Aram Beecham B. Eliott Nine, M.D.

## 2010-05-25 NOTE — Assessment & Plan Note (Signed)
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  She had some mild wound dehiscence seen by Dr. Darrick Penna a few weeks ago, but the wounds have now healed nicely in the left upper extremity.  She has an excellent pulse and palpable thrill in the basilic vein transposition, which is very superficial and should be easy to access. She has no evidence of distal steal.  I reassured her regarding these findings, and I think the fistula would be okay to use by June 15, giving it a full 3 months to heal and mature. She will return to see [place] on a p.r.n. basis.    Quita Skye Hart [place], M.D. Electronically Signed  JDL/MEDQ  D:  05/24/2010  T:  05/25/2010  Job:  [id number]

## 2010-05-27 NOTE — Assessment & Plan Note (Signed)
[facility]                            CARDIOLOGY OFFICE NOTE   Cynthia, Walls                      MRN:          [id number]  DATE:04/27/2006                            DOB:          Apr 17, 1945    [name] comes in today for further management of her left bundle  branch block and multiple cardiac risk factors.   She has lost 27 pounds!  She says her sugar is still not well  controlled, though Dr. Everardo All has been following her.  She says her  kidney function has actually deteriorated a little bit, with Dr. Eliott Nine.  Her blood pressure has also been running a little high.   She saw Dr. Eliott Nine the other day, who recommended furosemide 40 mg p.o.  b.i.d. from 40 once a day.   Her meds are:  1. Toprol XL 100 mg daily.  2. Nexium 40 mg daily.  3. Aspirin 325 daily.  4. Insulin, metformin 1000 b.i.d.  5. Furosemide 40 b.i.d.   She looks fairly comfortable and healthy today.  Her blood pressure was  actually up a little bit at 152/92.  Her pulse was 92 and she has a left  bundle on EKG.  Her weight is down to 215 from 242.  HEENT:  Normocephalic.  Atraumatic.  PERRLA, extraocular movements  intact.  Sclerae slightly injected.  Facial symmetry is  normal.  NECK:  Is supple.  Carotid upstrokes are equal bilaterally without  bruits. There is no JVD.  Thyroid is not enlarged. Trachea is midline.  LUNGS:  Are clear to auscultation.  HEART:  Reveals a nondisplaced PMI.  She had a paradoxically split S2.  ABDOMEN:  Soft, good bowel sounds.  EXTREMITIES:  Reveal no edema at all!  Pulses are present.  NEURO:  Exam is intact.   I am delighted that [name] has lost this weight.  She looks  remarkably better.  We need to continue to be aggressive with risk  factor modifications as I emphasized today.  I renewed her furosemide.  I will see her back in a year.     Thomas C. Daleen Squibb, MD, [facility]  Electronically Signed    TCW/MedQ  DD: 04/27/2006   DT: 04/27/2006  Job #: [id number]   cc:   Georgina Quint. Plotnikov, MD  [facility]. Eliott Nine, M.D.

## 2010-05-28 ENCOUNTER — Emergency Department (HOSPITAL_COMMUNITY)
Admission: EM | Admit: 2010-05-28 | Discharge: 2010-05-28 | Disposition: A | Discharge status: Home / Self Care | Payer: BC Managed Care – PPO | Attending: Emergency Medicine | Admitting: Emergency Medicine

## 2010-05-28 ENCOUNTER — Emergency Department (HOSPITAL_COMMUNITY): Payer: BC Managed Care – PPO

## 2010-05-28 DIAGNOSIS — Z85038 Personal history of other malignant neoplasm of large intestine: Secondary | ICD-10-CM | POA: Insufficient documentation

## 2010-05-28 DIAGNOSIS — R0989 Other specified symptoms and signs involving the circulatory and respiratory systems: Secondary | ICD-10-CM | POA: Diagnosis not present

## 2010-05-28 DIAGNOSIS — N186 End stage renal disease: Secondary | ICD-10-CM | POA: Insufficient documentation

## 2010-05-28 DIAGNOSIS — Z79899 Other long term (current) drug therapy: Secondary | ICD-10-CM | POA: Diagnosis not present

## 2010-05-28 DIAGNOSIS — Z86718 Personal history of other venous thrombosis and embolism: Secondary | ICD-10-CM | POA: Insufficient documentation

## 2010-05-28 DIAGNOSIS — I2510 Atherosclerotic heart disease of native coronary artery without angina pectoris: Secondary | ICD-10-CM | POA: Insufficient documentation

## 2010-05-28 DIAGNOSIS — R05 Cough: Secondary | ICD-10-CM | POA: Diagnosis present

## 2010-05-28 DIAGNOSIS — I120 Hypertensive chronic kidney disease with stage 5 chronic kidney disease or end stage renal disease: Secondary | ICD-10-CM | POA: Diagnosis not present

## 2010-05-28 DIAGNOSIS — E669 Obesity, unspecified: Secondary | ICD-10-CM | POA: Insufficient documentation

## 2010-05-28 DIAGNOSIS — R059 Cough, unspecified: Secondary | ICD-10-CM | POA: Insufficient documentation

## 2010-05-28 DIAGNOSIS — J069 Acute upper respiratory infection, unspecified: Secondary | ICD-10-CM | POA: Diagnosis not present

## 2010-05-28 DIAGNOSIS — Z8673 Personal history of transient ischemic attack (TIA), and cerebral infarction without residual deficits: Secondary | ICD-10-CM | POA: Insufficient documentation

## 2010-05-28 DIAGNOSIS — E119 Type 2 diabetes mellitus without complications: Secondary | ICD-10-CM | POA: Diagnosis not present

## 2010-05-28 DIAGNOSIS — R093 Abnormal sputum: Secondary | ICD-10-CM | POA: Diagnosis not present

## 2010-05-28 LAB — POCT CARDIAC MARKERS
CKMB, poc: 1.6 ng/mL (ref 1.0–8.0)
Troponin i, poc: <0.05 ng/mL (ref 0.00–0.09)

## 2010-05-28 LAB — CBC
HCT: 33.7 % — ABNORMAL LOW (ref 36.0–46.0)
Hemoglobin: 10.8 g/dL — ABNORMAL LOW (ref 12.0–15.0)
MCH: 29.1 pg (ref 26.0–34.0)
RBC: 3.71 MIL/uL — ABNORMAL LOW (ref 3.87–5.11)

## 2010-05-28 LAB — BASIC METABOLIC PANEL
BUN: 26 mg/dL — ABNORMAL HIGH (ref 6–23)
Chloride: 92 mEq/L — ABNORMAL LOW (ref 96–112)
Creatinine, Ser: 6.57 mg/dL — ABNORMAL HIGH (ref 0.4–1.2)
GFR calc non Af Amer: 6 mL/min — ABNORMAL LOW (ref >60)
Glucose, Bld: 130 mg/dL — ABNORMAL HIGH (ref 70–99)
Potassium: 3.6 mEq/L (ref 3.5–5.1)

## 2010-05-28 LAB — DIFFERENTIAL
Basophils Absolute: 0.1 10*3/uL (ref 0.0–0.1)
Basophils Relative: 0 % (ref 0–1)
Lymphocytes Relative: 8 % — ABNORMAL LOW (ref 12–46)
Monocytes Relative: 10 % (ref 3–12)
Neutro Abs: 20.3 10*3/uL — ABNORMAL HIGH (ref 1.7–7.7)
Neutrophils Relative %: 82 % — ABNORMAL HIGH (ref 43–77)

## 2010-05-28 LAB — PRO B NATRIURETIC PEPTIDE: Pro B Natriuretic peptide (BNP): 2614 pg/mL — ABNORMAL HIGH (ref 0–125)

## 2010-05-31 ENCOUNTER — Encounter: Payer: Self-pay | Admitting: Internal Medicine

## 2010-05-31 ENCOUNTER — Emergency Department (HOSPITAL_COMMUNITY): Payer: Medicare Other

## 2010-05-31 ENCOUNTER — Emergency Department (HOSPITAL_COMMUNITY)
Admission: EM | Admit: 2010-05-31 | Discharge: 2010-05-31 | Disposition: A | Discharge status: Home / Self Care | Payer: Medicare Other | Attending: Emergency Medicine | Admitting: Emergency Medicine

## 2010-05-31 DIAGNOSIS — E669 Obesity, unspecified: Secondary | ICD-10-CM | POA: Insufficient documentation

## 2010-05-31 DIAGNOSIS — I2510 Atherosclerotic heart disease of native coronary artery without angina pectoris: Secondary | ICD-10-CM | POA: Insufficient documentation

## 2010-05-31 DIAGNOSIS — Y841 Kidney dialysis as the cause of abnormal reaction of the patient, or of later complication, without mention of misadventure at the time of the procedure: Secondary | ICD-10-CM | POA: Insufficient documentation

## 2010-05-31 DIAGNOSIS — N186 End stage renal disease: Secondary | ICD-10-CM | POA: Insufficient documentation

## 2010-05-31 DIAGNOSIS — J189 Pneumonia, unspecified organism: Secondary | ICD-10-CM | POA: Insufficient documentation

## 2010-05-31 DIAGNOSIS — T82898A Other specified complication of vascular prosthetic devices, implants and grafts, initial encounter: Secondary | ICD-10-CM | POA: Insufficient documentation

## 2010-05-31 DIAGNOSIS — I120 Hypertensive chronic kidney disease with stage 5 chronic kidney disease or end stage renal disease: Secondary | ICD-10-CM | POA: Insufficient documentation

## 2010-05-31 DIAGNOSIS — E785 Hyperlipidemia, unspecified: Secondary | ICD-10-CM | POA: Insufficient documentation

## 2010-05-31 DIAGNOSIS — Z8679 Personal history of other diseases of the circulatory system: Secondary | ICD-10-CM | POA: Insufficient documentation

## 2010-06-01 ENCOUNTER — Encounter: Payer: Self-pay | Admitting: Internal Medicine

## 2010-06-01 ENCOUNTER — Ambulatory Visit (INDEPENDENT_AMBULATORY_CARE_PROVIDER_SITE_OTHER): Payer: Medicare Other | Admitting: Internal Medicine

## 2010-06-01 DIAGNOSIS — I639 Cerebral infarction, unspecified: Secondary | ICD-10-CM

## 2010-06-01 DIAGNOSIS — I6930 Unspecified sequelae of cerebral infarction: Secondary | ICD-10-CM | POA: Insufficient documentation

## 2010-06-01 DIAGNOSIS — I6350 Cerebral infarction due to unspecified occlusion or stenosis of unspecified cerebral artery: Secondary | ICD-10-CM

## 2010-06-01 DIAGNOSIS — J189 Pneumonia, unspecified organism: Secondary | ICD-10-CM

## 2010-06-01 DIAGNOSIS — E119 Type 2 diabetes mellitus without complications: Secondary | ICD-10-CM

## 2010-06-01 DIAGNOSIS — N186 End stage renal disease: Secondary | ICD-10-CM

## 2010-06-01 MED ORDER — PROMETHAZINE-CODEINE 6.25-10 MG/5ML PO SYRP: 5.0000 mL | ORAL_SOLUTION | ORAL | Status: DC | PRN

## 2010-06-01 MED ORDER — PREDNISONE 10 MG PO TABS: ORAL_TABLET | ORAL | Status: AC

## 2010-06-01 MED ORDER — BUDESONIDE-FORMOTEROL FUMARATE 160-4.5 MCG/ACT IN AERO: 2.0000 | INHALATION_SPRAY | Freq: Two times a day (BID) | RESPIRATORY_TRACT | Status: DC

## 2010-06-01 MED ORDER — METOPROLOL TARTRATE 100 MG PO TABS: 50.0000 mg | ORAL_TABLET | Freq: Two times a day (BID) | ORAL | Status: DC

## 2010-06-01 MED ORDER — MOXIFLOXACIN HCL 400 MG PO TABS: 400.0000 mg | ORAL_TABLET | Freq: Every day | ORAL | Status: DC

## 2010-06-01 NOTE — Patient Instructions (Signed)
We may need to stop amiodarone

## 2010-06-01 NOTE — Assessment & Plan Note (Signed)
On Avelox D#3

## 2010-06-01 NOTE — Assessment & Plan Note (Signed)
Fistula is maturating. On HD via cath

## 2010-06-01 NOTE — Assessment & Plan Note (Signed)
On Rx 

## 2010-06-06 ENCOUNTER — Other Ambulatory Visit: Payer: Self-pay | Admitting: *Deleted

## 2010-06-06 ENCOUNTER — Encounter: Payer: Self-pay | Admitting: Internal Medicine

## 2010-06-06 MED ORDER — AMIODARONE HCL 200 MG PO TABS: 200.0000 mg | ORAL_TABLET | Freq: Every day | ORAL | Status: DC

## 2010-06-06 NOTE — Progress Notes (Signed)
  Subjective:    Patient ID: [name], female    DOB: [birth year], 65 y.o.   MRN: [id number]  HPI  The patient presents for a follow-up of  chronic hypertension, chronic dyslipidemia, type 2 diabetes controlled with medicines and ESRD on HD   Review of Systems  Constitutional: Positive for fatigue. Negative for chills.  HENT: Positive for tinnitus. Negative for nosebleeds and dental problem.   Eyes: Negative for redness.  Respiratory: Negative for cough.   Gastrointestinal: Negative for blood in stool and anal bleeding.  Genitourinary: Negative for dysuria, urgency and flank pain.  Musculoskeletal: Positive for myalgias.  Neurological: Positive for weakness and numbness. Negative for dizziness.  Psychiatric/Behavioral: Negative for suicidal ideas. The patient is nervous/anxious.        Objective:   Physical Exam  Constitutional: She appears well-developed and well-nourished. No distress.  HENT:  Head: Normocephalic.  Right Ear: External ear normal.  Left Ear: External ear normal.  Nose: Nose normal.  Mouth/Throat: Oropharynx is clear and moist.  Eyes: Conjunctivae are normal. Pupils are equal, round, and reactive to light. Right eye exhibits no discharge. Left eye exhibits no discharge.  Neck: Normal range of motion. Neck supple. No JVD present. No tracheal deviation present. No thyromegaly present.  Cardiovascular: Normal rate, regular rhythm and normal heart sounds.   Pulmonary/Chest: No stridor. No respiratory distress. She has no wheezes.  Abdominal: Soft. Bowel sounds are normal. She exhibits no distension and no mass. There is no tenderness. There is no rebound and no guarding.  Musculoskeletal: She exhibits no edema and no tenderness.  Lymphadenopathy:    She has no cervical adenopathy.  Neurological: She displays normal reflexes. No cranial nerve deficit. She exhibits normal muscle tone. Coordination normal.  Skin: No rash noted. No erythema.  Psychiatric: She  has a normal mood and affect. Her behavior is normal. Judgment and thought content normal.          Assessment & Plan:  DIABETES MELLITUS, TYPE II On Rx  ESRD (end stage renal disease) on dialysis Fistula is maturating. On HD via cath  Pneumonia On Avelox D#3    FTT  We reviewed her recent tests, labs and notes  Total time over 45 mins > 50% spent counseling patient with regards to the problems above, coordination of care and treatment options.   [name]

## 2010-06-10 DIAGNOSIS — E119 Type 2 diabetes mellitus without complications: Secondary | ICD-10-CM

## 2010-06-10 DIAGNOSIS — I2589 Other forms of chronic ischemic heart disease: Secondary | ICD-10-CM

## 2010-06-10 DIAGNOSIS — N186 End stage renal disease: Secondary | ICD-10-CM

## 2010-06-10 DIAGNOSIS — I509 Heart failure, unspecified: Secondary | ICD-10-CM

## 2010-06-15 ENCOUNTER — Ambulatory Visit (INDEPENDENT_AMBULATORY_CARE_PROVIDER_SITE_OTHER): Payer: Medicare Other | Admitting: Internal Medicine

## 2010-06-15 ENCOUNTER — Encounter: Payer: Self-pay | Admitting: Internal Medicine

## 2010-06-15 VITALS — BP 92/50 | HR 102 | Temp 98.5°F | Wt 227.0 lb

## 2010-06-15 DIAGNOSIS — I639 Cerebral infarction, unspecified: Secondary | ICD-10-CM

## 2010-06-15 DIAGNOSIS — I10 Essential (primary) hypertension: Secondary | ICD-10-CM

## 2010-06-15 DIAGNOSIS — J189 Pneumonia, unspecified organism: Secondary | ICD-10-CM

## 2010-06-15 DIAGNOSIS — I6350 Cerebral infarction due to unspecified occlusion or stenosis of unspecified cerebral artery: Secondary | ICD-10-CM

## 2010-06-15 DIAGNOSIS — E119 Type 2 diabetes mellitus without complications: Secondary | ICD-10-CM

## 2010-06-15 MED ORDER — INSULIN GLULISINE 100 UNIT/ML IJ SOLN
7.0000 [IU] | Freq: Once | INTRAMUSCULAR | Status: AC
  Administered 2010-06-15: 7 [IU] via SUBCUTANEOUS

## 2010-06-15 NOTE — Progress Notes (Signed)
  Subjective:    Patient ID: [name], female    DOB: 08/02/1945, 65 y.o.   MRN: [id number]  HPI   The patient presents for a follow-up of  chronic hypertension, chronic dyslipidemia, type 2 diabetes controlled with medicines and ESRD on HD. Doing better.   Review of Systems  Constitutional: Positive for fatigue. Negative for chills.  HENT: Positive for tinnitus. Negative for nosebleeds and dental problem.   Eyes: Negative for redness.  Respiratory: Negative for cough.   Gastrointestinal: Negative for blood in stool and anal bleeding.  Genitourinary: Negative for dysuria, urgency and flank pain.  Musculoskeletal: Positive for myalgias.  Neurological: Positive for weakness and numbness. Negative for dizziness.  Psychiatric/Behavioral: Negative for suicidal ideas. The patient is nervous/anxious.    Wt Readings from Last 3 Encounters:  06/15/10 227 lb (102.967 kg)  06/01/10 227 lb (102.967 kg)  12/07/09 248 lb 8 oz (112.719 kg)        Objective:   Physical Exam  Constitutional: She appears well-developed and well-nourished. No distress.  HENT:  Head: Normocephalic.  Right Ear: External ear normal.  Left Ear: External ear normal.  Nose: Nose normal.  Mouth/Throat: Oropharynx is clear and moist.  Eyes: Conjunctivae are normal. Pupils are equal, round, and reactive to light. Right eye exhibits no discharge. Left eye exhibits no discharge.  Neck: Normal range of motion. Neck supple. No JVD present. No tracheal deviation present. No thyromegaly present.  Cardiovascular: Normal rate, regular rhythm and normal heart sounds.   Pulmonary/Chest: No stridor. No respiratory distress. She has no wheezes.  Abdominal: Soft. Bowel sounds are normal. She exhibits no distension and no mass. There is no tenderness. There is no rebound and no guarding.  Musculoskeletal: She exhibits no edema and no tenderness.  Lymphadenopathy:    She has no cervical adenopathy.  Neurological: She  displays normal reflexes. No cranial nerve deficit. She exhibits normal muscle tone. Coordination normal.  Skin: No rash noted. No erythema.  Psychiatric: She has a normal mood and affect. Her behavior is normal. Judgment and thought content normal.          Assessment & Plan:   FTT  We reviewed her recent tests, labs and notes  Total time over 45 mins > 50% spent counseling patient with regards to the problems above, coordination of care and treatment options.   [name]

## 2010-06-15 NOTE — Assessment & Plan Note (Signed)
Cont Rx/on HD

## 2010-06-15 NOTE — Assessment & Plan Note (Signed)
Better post abx

## 2010-06-15 NOTE — Assessment & Plan Note (Signed)
Using a cane 

## 2010-06-15 NOTE — Assessment & Plan Note (Signed)
Better. Cont Rx 

## 2010-06-19 ENCOUNTER — Encounter: Payer: Self-pay | Admitting: Internal Medicine

## 2010-07-07 ENCOUNTER — Other Ambulatory Visit: Payer: Self-pay | Admitting: *Deleted

## 2010-07-07 MED ORDER — ISOSORBIDE MONONITRATE ER 60 MG PO TB24: 60.0000 mg | ORAL_TABLET | Freq: Every day | ORAL | Status: DC

## 2010-07-25 ENCOUNTER — Telehealth: Payer: Self-pay

## 2010-07-25 NOTE — Telephone Encounter (Signed)
Cathy from [facility] called to check the status of order sent to Dr. Posey Rea for a 4 wheel walker with seat for the pt. Return call to [place] or Micheline Rough at [phone] ext [phone] or fax signed order back to [phone]

## 2010-07-26 NOTE — Telephone Encounter (Signed)
Order has been received. It is pending Dr. Loren Racer signature. Then will fax back.

## 2010-07-27 ENCOUNTER — Telehealth: Payer: Self-pay | Admitting: *Deleted

## 2010-07-27 NOTE — Telephone Encounter (Signed)
Ok Thx 

## 2010-07-27 NOTE — Telephone Encounter (Signed)
Caller requesting status on order faxed for: Colostomy Appliance. Can accept verbal order.

## 2010-07-28 NOTE — Telephone Encounter (Signed)
Gave verbal order for Colostomy appliance, pouches, paste, skin barrier rings, paste remover and deodorant. Written order faxed to [place] again to be signed and faxed back.

## 2010-07-29 ENCOUNTER — Telehealth: Payer: Self-pay | Admitting: *Deleted

## 2010-07-29 NOTE — Telephone Encounter (Signed)
Status of faxed request for Statement of Medical Necessity for [4-wheel] walker & bedside commode

## 2010-07-30 NOTE — Telephone Encounter (Signed)
OK. Thx

## 2010-08-01 NOTE — Telephone Encounter (Signed)
Have you seen this request?

## 2010-08-02 NOTE — Telephone Encounter (Signed)
I do not know for sure if we have received this. I called Olegario Messier at number listed above and left mess informing her of this and requesting that if she has not received the statement of medical necessity to please re-send it.

## 2010-08-08 ENCOUNTER — Encounter: Payer: Self-pay | Admitting: Endocrinology

## 2010-08-08 ENCOUNTER — Ambulatory Visit (INDEPENDENT_AMBULATORY_CARE_PROVIDER_SITE_OTHER): Payer: Medicare Other | Admitting: Endocrinology

## 2010-08-08 DIAGNOSIS — E119 Type 2 diabetes mellitus without complications: Secondary | ICD-10-CM

## 2010-08-08 DIAGNOSIS — Z992 Dependence on renal dialysis: Secondary | ICD-10-CM

## 2010-08-08 NOTE — Patient Instructions (Addendum)
Please sign release of information for last blood test results at dialysis. check your blood sugar 2 times a day.  vary the time of day when you check, between before the 3 meals, and at bedtime.  also check if you have symptoms of your blood sugar being too high or too low.  please keep a record of the readings and bring it to your next appointment here.  please call us sooner if you are having low blood sugar episodes. For now, reduce insulin to 22 units each morning.   Please make a follow-up appointment in 3 months.

## 2010-08-08 NOTE — Progress Notes (Signed)
Subjective:    Patient ID: [name], female    DOB: 1945/04/02, 65 y.o.   MRN: [id number]  HPI Pt has been on dialysis x 1 1/2 years.  no cbg record, but states cbg's are sometimes low approx once every few mos (80's).  Past Medical History  Diagnosis Date  . Other left bundle branch block   . Other and unspecified hyperlipidemia   . Mitral valve insufficiency and aortic valve insufficiency   . Cardiomegaly   . Unspecified essential hypertension   . Type II or unspecified type diabetes mellitus without mention of complication, not stated as uncontrolled   . Personal history of malignant neoplasm of large intestine   . Renal failure     ESRD, Dr Eliott Nine  . CHF (congestive heart failure)     Past Surgical History  Procedure Date  . Colostomy   . Revision urostomy cutaneous   . Esophagogastroduodenoscopy 03-18-04  . Electrocardiogram 04-27-06  . Placement of new left forearm arteriovenous graft 03-11-08  . Left heart catheterization and right heart catheterization 12-10    R. heart cath showed elevated left and right heart filling pressures w/ pulmonary artery pressure elevated mildly out of proportion to the wedge. The left heart cath showed diffuse distal vessel disease as well as a 75% stenosis in the mid circumflex w/ a 90% stenosis of the ostial first obtuse marginal. These lesions were in close proximity. there was a 60-70%mild RCA stenosis.     History   Social History  . Marital Status: Single    Spouse Name: N/A    Number of Children: N/A  . Years of Education: N/A   Occupational History  . Not on file.   Social History Main Topics  . Smoking status: Never Smoker   . Smokeless tobacco: Not on file  . Alcohol Use: No  . Drug Use: Not on file  . Sexually Active: No   Other Topics Concern  . Not on file   Social History Narrative  . No narrative on file    Current Outpatient Prescriptions on File Prior to Visit  Medication Sig Dispense Refill  .  amiodarone (PACERONE) 200 MG tablet Take 1 tablet (200 mg total) by mouth daily.  90 tablet  3  . aspirin 325 MG tablet Take 325 mg by mouth daily.        . budesonide-formoterol (SYMBICORT) 160-4.5 MCG/ACT inhaler Inhale 2 puffs into the lungs 2 (two) times daily.  1 Inhaler  3  . calcium acetate (PHOSLO) 667 MG capsule Take 2,001 mg by mouth 3 (three) times daily. Take 2 capsules in the middle of each meal each day      . insulin lispro protamine-insulin lispro (HUMALOG MIX 50/50) (50-50) 100 UNIT/ML SUSP Inject 25 Units into the skin every morning.        . isosorbide mononitrate (IMDUR) 60 MG 24 hr tablet Take 1 tablet (60 mg total) by mouth daily.  30 tablet  6  . metoprolol (LOPRESSOR) 100 MG tablet Take 0.5 tablets (50 mg total) by mouth 2 (two) times daily.  30 tablet  11  . simvastatin (ZOCOR) 10 MG tablet Take 10 mg by mouth at bedtime.        . promethazine-codeine (PHENERGAN WITH CODEINE) 6.25-10 MG/5ML syrup Take 5 mLs by mouth every 4 (four) hours as needed.  240 mL  1    Allergies  Allergen Reactions  . Ace Inhibitors   . Lisinopril  REACTION: unspecified    Family History  Problem Relation Age of Onset  . Cancer Sister     colon  . Hypertension Other   . Diabetes Mother   . Hypertension Mother   . Hypertension Father     BP 134/80  Pulse 106  Temp(Src) 98.6 F (37 C) (Oral)  Ht 5' (1.524 m)  Wt 227 lb (102.967 kg)  BMI 44.33 kg/m2  SpO2 95%  Review of Systems Denies loc    Objective:   Physical Exam GENERAL: no distress SKIN: Insulin injection sites at the anterior thighs are normal Ext: sees podiatry  Lab Results  Component Value Date   HGBA1C  Value: 7.3 (NOTE)                                                                       According to the ADA Clinical Practice Recommendations for 2011, when HbA1c is used as a screening test:   >=6.5%   Diagnostic of Diabetes Mellitus           (if abnormal result  is confirmed)  5.7-6.4%   Increased risk  of developing Diabetes Mellitus  References:Diagnosis and Classification of Diabetes Mellitus,Diabetes Care,2011,34(Suppl 1):S62-S69 and Standards of Medical Care in         Diabetes - 2011,Diabetes Care,2011,34  (Suppl 1):S11-S61.* 05/16/2010      Assessment & Plan:  Dm is overcontrolled, given this regimen, which does match insulin to her changing needs throughout the day

## 2010-08-09 ENCOUNTER — Encounter (HOSPITAL_COMMUNITY): Payer: Medicare Other

## 2010-09-13 ENCOUNTER — Ambulatory Visit (HOSPITAL_COMMUNITY)
Admission: RE | Admit: 2010-09-13 | Discharge: 2010-09-13 | Disposition: A | Discharge status: Home / Self Care | Payer: Medicare Other | Source: Ambulatory Visit | Attending: Vascular Surgery | Admitting: Vascular Surgery

## 2010-09-13 DIAGNOSIS — Z452 Encounter for adjustment and management of vascular access device: Secondary | ICD-10-CM | POA: Insufficient documentation

## 2010-09-13 DIAGNOSIS — N186 End stage renal disease: Secondary | ICD-10-CM | POA: Insufficient documentation

## 2010-09-29 ENCOUNTER — Ambulatory Visit (INDEPENDENT_AMBULATORY_CARE_PROVIDER_SITE_OTHER): Payer: Medicare Other | Admitting: Endocrinology

## 2010-09-29 ENCOUNTER — Inpatient Hospital Stay (HOSPITAL_COMMUNITY)
Admission: AD | Admit: 2010-09-29 | Discharge: 2010-10-03 | Disposition: A | Discharge status: Home / Self Care | Payer: Medicare Other | Source: Ambulatory Visit | Attending: Internal Medicine | Admitting: Internal Medicine

## 2010-09-29 ENCOUNTER — Emergency Department (HOSPITAL_COMMUNITY)
Admission: EM | Admit: 2010-09-29 | Discharge: 2010-09-29 | Disposition: A | Discharge status: Other Acute Inpatient Hospital | Payer: Medicare Other | Attending: Emergency Medicine | Admitting: Emergency Medicine

## 2010-09-29 ENCOUNTER — Encounter: Payer: Self-pay | Admitting: Endocrinology

## 2010-09-29 DIAGNOSIS — Z992 Dependence on renal dialysis: Secondary | ICD-10-CM

## 2010-09-29 DIAGNOSIS — E785 Hyperlipidemia, unspecified: Secondary | ICD-10-CM | POA: Diagnosis present

## 2010-09-29 DIAGNOSIS — Z936 Other artificial openings of urinary tract status: Secondary | ICD-10-CM

## 2010-09-29 DIAGNOSIS — D62 Acute posthemorrhagic anemia: Secondary | ICD-10-CM | POA: Diagnosis not present

## 2010-09-29 DIAGNOSIS — E1169 Type 2 diabetes mellitus with other specified complication: Secondary | ICD-10-CM | POA: Diagnosis present

## 2010-09-29 DIAGNOSIS — I120 Hypertensive chronic kidney disease with stage 5 chronic kidney disease or end stage renal disease: Secondary | ICD-10-CM | POA: Diagnosis present

## 2010-09-29 DIAGNOSIS — I498 Other specified cardiac arrhythmias: Secondary | ICD-10-CM | POA: Diagnosis present

## 2010-09-29 DIAGNOSIS — Z933 Colostomy status: Secondary | ICD-10-CM

## 2010-09-29 DIAGNOSIS — I5022 Chronic systolic (congestive) heart failure: Secondary | ICD-10-CM | POA: Diagnosis present

## 2010-09-29 DIAGNOSIS — I2589 Other forms of chronic ischemic heart disease: Secondary | ICD-10-CM | POA: Diagnosis present

## 2010-09-29 DIAGNOSIS — I447 Left bundle-branch block, unspecified: Secondary | ICD-10-CM | POA: Diagnosis present

## 2010-09-29 DIAGNOSIS — E1159 Type 2 diabetes mellitus with other circulatory complications: Principal | ICD-10-CM | POA: Diagnosis present

## 2010-09-29 DIAGNOSIS — M79609 Pain in unspecified limb: Secondary | ICD-10-CM

## 2010-09-29 DIAGNOSIS — A4902 Methicillin resistant Staphylococcus aureus infection, unspecified site: Secondary | ICD-10-CM | POA: Diagnosis present

## 2010-09-29 DIAGNOSIS — L97509 Non-pressure chronic ulcer of other part of unspecified foot with unspecified severity: Secondary | ICD-10-CM | POA: Diagnosis present

## 2010-09-29 DIAGNOSIS — I252 Old myocardial infarction: Secondary | ICD-10-CM

## 2010-09-29 DIAGNOSIS — I509 Heart failure, unspecified: Secondary | ICD-10-CM | POA: Diagnosis present

## 2010-09-29 DIAGNOSIS — D638 Anemia in other chronic diseases classified elsewhere: Secondary | ICD-10-CM | POA: Diagnosis present

## 2010-09-29 DIAGNOSIS — Z85038 Personal history of other malignant neoplasm of large intestine: Secondary | ICD-10-CM

## 2010-09-29 DIAGNOSIS — N186 End stage renal disease: Secondary | ICD-10-CM | POA: Diagnosis present

## 2010-09-29 DIAGNOSIS — N2581 Secondary hyperparathyroidism of renal origin: Secondary | ICD-10-CM | POA: Diagnosis present

## 2010-09-29 DIAGNOSIS — Z7982 Long term (current) use of aspirin: Secondary | ICD-10-CM

## 2010-09-29 DIAGNOSIS — I69998 Other sequelae following unspecified cerebrovascular disease: Secondary | ICD-10-CM

## 2010-09-29 DIAGNOSIS — Z794 Long term (current) use of insulin: Secondary | ICD-10-CM

## 2010-09-29 DIAGNOSIS — Z79899 Other long term (current) drug therapy: Secondary | ICD-10-CM

## 2010-09-29 DIAGNOSIS — E11621 Type 2 diabetes mellitus with foot ulcer: Secondary | ICD-10-CM

## 2010-09-29 DIAGNOSIS — I96 Gangrene, not elsewhere classified: Secondary | ICD-10-CM | POA: Diagnosis present

## 2010-09-29 DIAGNOSIS — I5032 Chronic diastolic (congestive) heart failure: Secondary | ICD-10-CM | POA: Diagnosis present

## 2010-09-29 DIAGNOSIS — I2510 Atherosclerotic heart disease of native coronary artery without angina pectoris: Secondary | ICD-10-CM | POA: Diagnosis present

## 2010-09-29 DIAGNOSIS — R29898 Other symptoms and signs involving the musculoskeletal system: Secondary | ICD-10-CM | POA: Diagnosis present

## 2010-09-29 DIAGNOSIS — D72829 Elevated white blood cell count, unspecified: Secondary | ICD-10-CM | POA: Diagnosis present

## 2010-09-29 LAB — CBC
HCT: 32.8 — ABNORMAL LOW
Hemoglobin: 8.3 g/dL — ABNORMAL LOW (ref 12.0–15.0)
MCHC: 32.6
MCV: 85.3
Platelets: 450 10*3/uL — ABNORMAL HIGH (ref 150–400)
RBC: 3.85 — ABNORMAL LOW
RBC: 3.19 MIL/uL — ABNORMAL LOW (ref 3.87–5.11)
WBC: 7.4
WBC: 30 10*3/uL — ABNORMAL HIGH (ref 4.0–10.5)

## 2010-09-29 LAB — RENAL FUNCTION PANEL
Albumin: 2.8 — ABNORMAL LOW
CO2: 31 mEq/L (ref 19–32)
Calcium: 8.7 mg/dL (ref 8.4–10.5)
Chloride: 110
Chloride: 94 mEq/L — ABNORMAL LOW (ref 96–112)
GFR calc Af Amer: 8 mL/min — ABNORMAL LOW (ref >60)
GFR calc non Af Amer: 14 — ABNORMAL LOW
GFR calc non Af Amer: 6 mL/min — ABNORMAL LOW (ref >60)
Phosphorus: 3.9
Potassium: 4.5
Potassium: 3.2 mEq/L — ABNORMAL LOW (ref 3.5–5.1)
Sodium: 135
Sodium: 138 mEq/L (ref 135–145)

## 2010-09-29 LAB — FERRITIN: Ferritin: 332 — ABNORMAL HIGH (ref 10–291)

## 2010-09-29 LAB — IRON AND TIBC: UIBC: 175

## 2010-09-29 LAB — MRSA PCR SCREENING: MRSA by PCR: POSITIVE — AB

## 2010-09-29 LAB — GLUCOSE, CAPILLARY: Glucose-Capillary: 129 mg/dL — ABNORMAL HIGH (ref 70–99)

## 2010-09-29 NOTE — Patient Instructions (Addendum)
Admit to [facility].  i spoke to hosp dr--team 4. She'll need dialysis tomorrow.  She'll need blood cultures, abx, and 02.

## 2010-09-29 NOTE — Progress Notes (Signed)
Subjective:    Patient ID: [name], female    DOB: 1945/03/10, 65 y.o.   MRN: [id number]  HPI Pt states 1 month of a moderate ulcer at the right great toe, and assoc fever (says was 102 at dialysis yesterday). no cbg record, but states cbg's are well-controlled.   Denies sob.   Past Medical History  Diagnosis Date  . Other left bundle branch block   . Other and unspecified hyperlipidemia   . Mitral valve insufficiency and aortic valve insufficiency   . Cardiomegaly   . Unspecified essential hypertension   . Type II or unspecified type diabetes mellitus without mention of complication, not stated as uncontrolled   . Personal history of malignant neoplasm of large intestine   . Renal failure     ESRD, Dr Eliott Nine  . CHF (congestive heart failure)     Past Surgical History  Procedure Date  . Colostomy   . Revision urostomy cutaneous   . Esophagogastroduodenoscopy 03-18-04  . Electrocardiogram 04-27-06  . Placement of new left forearm arteriovenous graft 03-11-08  . Left heart catheterization and right heart catheterization 12-10    R. heart cath showed elevated left and right heart filling pressures w/ pulmonary artery pressure elevated mildly out of proportion to the wedge. The left heart cath showed diffuse distal vessel disease as well as a 75% stenosis in the mid circumflex w/ a 90% stenosis of the ostial first obtuse marginal. These lesions were in close proximity. there was a 60-70%mild RCA stenosis.     History   Social History  . Marital Status: Single    Spouse Name: N/A    Number of Children: N/A  . Years of Education: N/A   Occupational History  . Not on file.   Social History Main Topics  . Smoking status: Never Smoker   . Smokeless tobacco: Not on file  . Alcohol Use: No  . Drug Use: Not on file  . Sexually Active: No   Other Topics Concern  . Not on file   Social History Narrative  . No narrative on file    Current Outpatient Prescriptions on  File Prior to Visit  Medication Sig Dispense Refill  . amiodarone (PACERONE) 200 MG tablet Take 1 tablet (200 mg total) by mouth daily.  90 tablet  3  . aspirin 325 MG tablet Take 325 mg by mouth daily.        . budesonide-formoterol (SYMBICORT) 160-4.5 MCG/ACT inhaler Inhale 2 puffs into the lungs 2 (two) times daily.  1 Inhaler  3  . calcium acetate (PHOSLO) 667 MG capsule Take 2,001 mg by mouth 3 (three) times daily. Take 2 capsules in the middle of each meal each day      . insulin lispro protamine-insulin lispro (HUMALOG MIX 50/50) (50-50) 100 UNIT/ML SUSP Inject 22 Units into the skin every morning.       . isosorbide mononitrate (IMDUR) 60 MG 24 hr tablet Take 1 tablet (60 mg total) by mouth daily.  30 tablet  6  . metoprolol (LOPRESSOR) 100 MG tablet Take 0.5 tablets (50 mg total) by mouth 2 (two) times daily.  30 tablet  11  . promethazine-codeine (PHENERGAN WITH CODEINE) 6.25-10 MG/5ML syrup Take 5 mLs by mouth every 4 (four) hours as needed.  240 mL  1  . simvastatin (ZOCOR) 10 MG tablet Take 10 mg by mouth at bedtime.          Allergies  Allergen Reactions  . Ace  Inhibitors   . Lisinopril     REACTION: unspecified    Family History  Problem Relation Age of Onset  . Cancer Sister     colon  . Hypertension Other   . Diabetes Mother   . Hypertension Mother   . Hypertension Father     BP 132/72  Pulse 110  Temp(Src) 98.2 F (36.8 C) (Oral)  Ht 5' (1.524 m)  Wt 224 lb 6.4 oz (101.787 kg)  BMI 43.83 kg/m2  SpO2 87%  Review of Systems She has chronic numbness of the feet, but no falls.      Objective:   Physical Exam VITAL SIGNS:  See vs page GENERAL: no distress.  Obese LUNGS:  Clear to auscultation. Pulses: dorsalis pedis are absent bilat. Feet: no deformity.  no ulcer on the feet.  feet are of normal color and temp.  no edema.  There is a shallow ulcer at the plantar aspect of the right great toe.  There is severe onychomycosis of toenails. Neuro: sensation is  intact to touch on the feet, but severely decreased from normal.    Assessment & Plan:  Foot ulcer, new. peripheral neuropathy, unchanged. Dm, apparently well-controlled. Hypoxia, uncertain etiology.

## 2010-09-30 ENCOUNTER — Inpatient Hospital Stay (HOSPITAL_COMMUNITY): Payer: Medicare Other

## 2010-09-30 DIAGNOSIS — L98499 Non-pressure chronic ulcer of skin of other sites with unspecified severity: Secondary | ICD-10-CM

## 2010-09-30 DIAGNOSIS — I739 Peripheral vascular disease, unspecified: Secondary | ICD-10-CM

## 2010-09-30 LAB — CBC
HCT: 37.8
Hemoglobin: 12.3
MCH: 25.2 pg — ABNORMAL LOW (ref 26.0–34.0)
MCV: 84.3 fL (ref 78.0–100.0)
Platelets: 523 10*3/uL — ABNORMAL HIGH (ref 150–400)
RDW: 15.7 % — ABNORMAL HIGH (ref 11.5–15.5)
RDW: 16.5 — ABNORMAL HIGH

## 2010-09-30 LAB — RENAL FUNCTION PANEL
Albumin: 2.2 g/dL — ABNORMAL LOW (ref 3.5–5.2)
Albumin: 2.6 — ABNORMAL LOW
CO2: 30 mEq/L (ref 19–32)
CO2: 12 — ABNORMAL LOW
Calcium: 9.4 mg/dL (ref 8.4–10.5)
Calcium: 8.2 — ABNORMAL LOW
Creatinine, Ser: 6.89 mg/dL — ABNORMAL HIGH (ref 0.50–1.10)
GFR calc Af Amer: 17 — ABNORMAL LOW
GFR calc non Af Amer: 6 mL/min — ABNORMAL LOW (ref >60)
GFR calc non Af Amer: 14 — ABNORMAL LOW
Phosphorus: 4.3
Sodium: 138

## 2010-09-30 LAB — FERRITIN: Ferritin: 345 — ABNORMAL HIGH (ref 10–291)

## 2010-09-30 LAB — GLUCOSE, CAPILLARY
Glucose-Capillary: 171 mg/dL — ABNORMAL HIGH (ref 70–99)
Glucose-Capillary: 246 mg/dL — ABNORMAL HIGH (ref 70–99)

## 2010-09-30 LAB — IRON AND TIBC
Saturation Ratios: 21
UIBC: 172

## 2010-10-01 LAB — RENAL FUNCTION PANEL
Albumin: 1.8 g/dL — ABNORMAL LOW (ref 3.5–5.2)
BUN: 31 mg/dL — ABNORMAL HIGH (ref 6–23)
Calcium: 8.5 mg/dL (ref 8.4–10.5)
GFR calc Af Amer: 14 mL/min — ABNORMAL LOW (ref >60)
GFR calc non Af Amer: 12 mL/min — ABNORMAL LOW (ref >60)
Glucose, Bld: 284 mg/dL — ABNORMAL HIGH (ref 70–99)
Glucose, Bld: 521 mg/dL — ABNORMAL HIGH (ref 70–99)
Phosphorus: 2.7 mg/dL (ref 2.3–4.6)
Phosphorus: 2 mg/dL — ABNORMAL LOW (ref 2.3–4.6)
Potassium: 4.3 mEq/L (ref 3.5–5.1)
Sodium: 137 mEq/L (ref 135–145)

## 2010-10-01 LAB — CBC
Hemoglobin: 9.5 g/dL — ABNORMAL LOW (ref 12.0–15.0)
MCH: 25.7 pg — ABNORMAL LOW (ref 26.0–34.0)
MCHC: 30.6 g/dL (ref 30.0–36.0)
Platelets: 474 10*3/uL — ABNORMAL HIGH (ref 150–400)
RBC: 3.69 MIL/uL — ABNORMAL LOW (ref 3.87–5.11)
RDW: 15.9 % — ABNORMAL HIGH (ref 11.5–15.5)
WBC: 24.2 10*3/uL — ABNORMAL HIGH (ref 4.0–10.5)

## 2010-10-01 LAB — GLUCOSE, RANDOM: Glucose, Bld: 547 mg/dL — ABNORMAL HIGH (ref 70–99)

## 2010-10-01 LAB — GLUCOSE, CAPILLARY
Glucose-Capillary: 447 mg/dL — ABNORMAL HIGH (ref 70–99)
Glucose-Capillary: 538 mg/dL — ABNORMAL HIGH (ref 70–99)

## 2010-10-02 LAB — GLUCOSE, CAPILLARY
Glucose-Capillary: 310 mg/dL — ABNORMAL HIGH (ref 70–99)
Glucose-Capillary: 374 mg/dL — ABNORMAL HIGH (ref 70–99)

## 2010-10-03 ENCOUNTER — Inpatient Hospital Stay (HOSPITAL_COMMUNITY): Payer: Medicare Other

## 2010-10-03 LAB — PTH, INTACT AND CALCIUM: PTH: 224.2 — ABNORMAL HIGH

## 2010-10-03 LAB — GLUCOSE, CAPILLARY
Glucose-Capillary: 119 mg/dL — ABNORMAL HIGH (ref 70–99)
Glucose-Capillary: 244 mg/dL — ABNORMAL HIGH (ref 70–99)

## 2010-10-03 LAB — RENAL FUNCTION PANEL
BUN: 60 mg/dL — ABNORMAL HIGH (ref 6–23)
BUN: 58 — ABNORMAL HIGH
Calcium: 8.5
Creatinine, Ser: 3.28 — ABNORMAL HIGH
Glucose, Bld: 183 mg/dL — ABNORMAL HIGH (ref 70–99)
Glucose, Bld: 171 — ABNORMAL HIGH
Phosphorus: 2.1 mg/dL — ABNORMAL LOW (ref 2.3–4.6)
Phosphorus: 4.4
Potassium: 3.9 mEq/L (ref 3.5–5.1)
Sodium: 138

## 2010-10-03 LAB — CBC
HCT: 27.9 % — ABNORMAL LOW (ref 36.0–46.0)
Hemoglobin: 8.5 g/dL — ABNORMAL LOW (ref 12.0–15.0)
Hemoglobin: 11.3 — ABNORMAL LOW
MCH: 25.2 pg — ABNORMAL LOW (ref 26.0–34.0)
MCHC: 30.5 g/dL (ref 30.0–36.0)
MCHC: 33.3
RBC: 3.99
WBC: 8.3

## 2010-10-03 LAB — IRON AND TIBC
Saturation Ratios: 27
TIBC: 227 — ABNORMAL LOW
UIBC: 165

## 2010-10-03 LAB — FERRITIN: Ferritin: 363 — ABNORMAL HIGH (ref 10–291)

## 2010-10-04 ENCOUNTER — Inpatient Hospital Stay (HOSPITAL_COMMUNITY)
Admission: EM | Admit: 2010-10-04 | Discharge: 2010-10-12 | DRG: 239 | Disposition: A | Discharge status: Rehabilitation Facility | Payer: Medicare Other | Source: Ambulatory Visit | Attending: Internal Medicine | Admitting: Internal Medicine

## 2010-10-04 ENCOUNTER — Emergency Department (HOSPITAL_COMMUNITY): Payer: Medicare Other

## 2010-10-04 LAB — CBC
HCT: 32 — ABNORMAL LOW
HCT: 33.5 % — ABNORMAL LOW (ref 36.0–46.0)
Hemoglobin: 9.8 g/dL — ABNORMAL LOW (ref 12.0–15.0)
MCH: 24.7 pg — ABNORMAL LOW (ref 26.0–34.0)
MCHC: 32.7
MCHC: 29.3 g/dL — ABNORMAL LOW (ref 30.0–36.0)
MCV: 85.3
Platelets: 279
RDW: 16.3 — ABNORMAL HIGH

## 2010-10-04 LAB — DIFFERENTIAL
Basophils Absolute: 0 10*3/uL (ref 0.0–0.1)
Eosinophils Relative: 1 % (ref 0–5)
Lymphocytes Relative: 15 % (ref 12–46)
Lymphs Abs: 1.7 10*3/uL (ref 0.7–4.0)
Neutro Abs: 9 10*3/uL — ABNORMAL HIGH (ref 1.7–7.7)

## 2010-10-04 LAB — POCT I-STAT, CHEM 8
Chloride: 99 mEq/L (ref 96–112)
Creatinine, Ser: 5 mg/dL — ABNORMAL HIGH (ref 0.50–1.10)
Glucose, Bld: 261 mg/dL — ABNORMAL HIGH (ref 70–99)
HCT: 39 % (ref 36.0–46.0)
Potassium: 4.7 mEq/L (ref 3.5–5.1)
Sodium: 135 mEq/L (ref 135–145)

## 2010-10-04 LAB — RENAL FUNCTION PANEL
BUN: 72 — ABNORMAL HIGH
Chloride: 116 — ABNORMAL HIGH
Glucose, Bld: 157 — ABNORMAL HIGH
Potassium: 4.2
Sodium: 139

## 2010-10-04 LAB — IRON AND TIBC: Iron: 55

## 2010-10-05 ENCOUNTER — Inpatient Hospital Stay (HOSPITAL_COMMUNITY): Payer: Medicare Other

## 2010-10-05 LAB — CBC
HCT: 28 % — ABNORMAL LOW (ref 36.0–46.0)
MCHC: 30.7 g/dL (ref 30.0–36.0)
MCV: 82.6 fL (ref 78.0–100.0)
RBC: 3.56 — ABNORMAL LOW
RDW: 15.8 % — ABNORMAL HIGH (ref 11.5–15.5)
WBC: 9.4
WBC: 11.6 10*3/uL — ABNORMAL HIGH (ref 4.0–10.5)

## 2010-10-05 LAB — BASIC METABOLIC PANEL
BUN: 44 mg/dL — ABNORMAL HIGH (ref 6–23)
Chloride: 93 mEq/L — ABNORMAL LOW (ref 96–112)
Creatinine, Ser: 7.21 mg/dL — ABNORMAL HIGH (ref 0.50–1.10)
GFR calc Af Amer: 7 mL/min — ABNORMAL LOW (ref >60)

## 2010-10-05 LAB — GLUCOSE, CAPILLARY: Glucose-Capillary: 236 mg/dL — ABNORMAL HIGH (ref 70–99)

## 2010-10-05 LAB — IRON AND TIBC
Iron: 48
TIBC: 220 — ABNORMAL LOW
UIBC: 172

## 2010-10-05 LAB — FERRITIN: Ferritin: 351 — ABNORMAL HIGH (ref 10–291)

## 2010-10-05 NOTE — H&P (Signed)
[name], [name] NO.:  [id number]  MEDICAL RECORD NO.:  [id number]  LOCATION:  [facility]                         FACILITY:  [facility]  PHYSICIAN:  Gery Pray, MD      DATE OF BIRTH:  May 16, 1945  DATE OF ADMISSION:  10/04/2010 DATE OF DISCHARGE:                             HISTORY & PHYSICAL   PRIMARY CARE PHYSICIAN:  Georgina Quint. Plotnikov, MD  NEPHROLOGIST:  Cecille Aver, MD  ENDOCRINOLOGIST:  Cleophas Dunker Everardo All, MD  CODE STATUS:  Full code.  The patient goes to team 1.  CHIEF COMPLAINT:  Gangrene of the right toe.  HISTORY OF PRESENT ILLNESS:  This is a 65 year old female with multiple medical issues, who was recently admitted and discharged yesterday.  At that time, she was diagnosed with a right great toe nonhealing wound/gangrene.  She had an aortogram with runoff.  She also had an angioplasty done.  Imaging showed that the patient had anterior tibial artery stenosis of 70% proximal third as well as stenosis of the left anterior tibial artery.  These were stented.  She was seen by Dr. Durene Cal for Vascular Surgery while she was here.  It was thought that she needed an amputation for this also; however, the patient declined. She was discharged home to follow up with in dialysis for antibiotic, she was also been on vanco and [place].  The patient states that when she got home last night the toe started bleeding again.  The skin came off. She started developing some intermittent pain.  She reports no fevers, no chills, no nausea, no vomiting.  She came back to the ER because of these new developments.  Now, she is willing to discuss amputation. History obtained from the patient who appears reliable.  PAST MEDICAL HISTORY: 1. End-stage renal disease, on hemodialysis Monday, Wednesday, and     Friday at [facility]. 2. Diabetes mellitus. 3. Ischemic cardiomyopathy with EF of 25%. 4. History of CHF. 5. Morbid obesity. 6.  Diabetes mellitus. 7. Hypertension. 8. History of CVA with residual right-sided weakness. 9. Remote history of colon cancer.  PAST SURGICAL HISTORY: 1. Left colectomy. 2. Right urostomy. 3. Left A-V graft.  MEDICATIONS: 1. Zemplar 2 mcg IV Monday, Wednesday, and Friday per Renal. 2. Humulin 50/50 25 units in the a.m. 3. Amiodarone 200 mg daily. 4. PhosLo 665 mg 2 capsules three times daily. 5. Aspirin 325 mg p.o. daily. 6. Imdur 60 mg daily. 7. Metoprolol 100 mg take half tablet on nondialysis days Tuesday,     Thursday, Saturday, and Sunday. 8. Simvastatin 10 mg daily.  ALLERGIES:  The patient is allergic to IODINE and LISINOPRIL.  SOCIAL HISTORY:  Lives with her daughter.  Negative tobacco, alcohol, or illicit drugs.  No home oxygen.  She uses a walker at the hemodialysis because she feels weak.  FAMILY HISTORY:  Significant for diabetes mellitus and hypertension.  REVIEW OF SYSTEMS:  All 10-point systems reviewed, are negative except as noted in the HPI.  PHYSICAL EXAMINATION:  VITAL SIGNS:  Blood pressure 186/80, pulse 85, respirations 20, temperature 96.9, satting 97% on room air. GENERAL:  Alert and oriented female, currently in no  acute distress. HEENT:  Eyes, pink conjunctivae.  PERRLA.  ENT, moist oral mucosa. Trachea midline. NECK:  Supple. LUNGS:  Clear to auscultation bilaterally.  No use of accessory muscles. CARDIOVASCULAR:  Regular rate and rhythm without murmurs, rigors, or gallops.  No JVD. ABDOMEN:  Soft, positive bowel sounds, nontender, nondistended.  Obese. The patient does have a colostomy on the left and a urostomy on the right.  No evidence of surrounding cellulitis. SKIN:  No rashes.  No subcutaneous crepitation.  She does have an A-V graft in the left arm.  On the right foot, the patient does have a diabetic foot ulcer.  The skin on the large toe has no completed soft off.  There is no warmth. MUSCULOSKELETAL:  Strength is 5/5 in bilateral  lower extremities.  The patient's right toe does not have palpable manual pulses, DP or PD. PSYCH:  Alert, oriented, appropriate female.  LABORATORY DATA:  White blood count 11.6, hemoglobin 9.8, platelets 463.  X-ray of the foot is concerning for osteomyelitis of the tip of the great toe.  BMP has not been done.  ASSESSMENT AND PLAN: 1. 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  The     patient will be readmitted.  IV antibiotics will be continued,     Fortaz and vanco.  We will reconsult Vascular Surgery, Dr. Durene Cal for amputation.  IV pain medications have been ordered     p.r.n. 2. Diabetes mellitus. 3. End-stage renal disease. 4. 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 5. Dyslipidemia.    We will resume the patient's home    medications.  We will place the patient on ADA diet.  We will consult    her nephrologist, Dr. Kathrene Bongo for resumption of hemodialysis.          ______________________________ Gery Pray, MD     DC/MEDQ  D:  10/04/2010  T:  10/04/2010  Job:  [id number]  Electronically Signed by Gery Pray MD on 10/05/2010 12:13:20 AM

## 2010-10-06 ENCOUNTER — Inpatient Hospital Stay (HOSPITAL_COMMUNITY): Payer: Medicare Other

## 2010-10-06 LAB — RENAL FUNCTION PANEL
BUN: 47 mg/dL — ABNORMAL HIGH (ref 6–23)
CO2: 27 mEq/L (ref 19–32)
CO2: 17 — ABNORMAL LOW
Calcium: 8.3 mg/dL — ABNORMAL LOW (ref 8.4–10.5)
Calcium: 8.2 — ABNORMAL LOW
Chloride: 115 — ABNORMAL HIGH
Creatinine, Ser: 7.77 mg/dL — ABNORMAL HIGH (ref 0.50–1.10)
GFR calc non Af Amer: 5 mL/min — ABNORMAL LOW (ref >60)
GFR calc non Af Amer: 14 — ABNORMAL LOW
Glucose, Bld: 196 mg/dL — ABNORMAL HIGH (ref 70–99)
Glucose, Bld: 144 — ABNORMAL HIGH
Sodium: 139

## 2010-10-06 LAB — CBC
MCH: 24.8 pg — ABNORMAL LOW (ref 26.0–34.0)
MCHC: 33.4
MCV: 83 fL (ref 78.0–100.0)
Platelets: 390 10*3/uL (ref 150–400)
Platelets: 259
RBC: 3.06 MIL/uL — ABNORMAL LOW (ref 3.87–5.11)
RBC: 3.66 — ABNORMAL LOW
WBC: 9.4

## 2010-10-06 LAB — FERRITIN: Ferritin: 327 — ABNORMAL HIGH (ref 10–291)

## 2010-10-06 LAB — CULTURE, BLOOD (ROUTINE X 2)
Culture  Setup Time: 201209210207
Culture: NO GROWTH
Culture: NO GROWTH

## 2010-10-06 LAB — GLUCOSE, CAPILLARY
Glucose-Capillary: 199 mg/dL — ABNORMAL HIGH (ref 70–99)
Glucose-Capillary: 277 mg/dL — ABNORMAL HIGH (ref 70–99)
Glucose-Capillary: 172 mg/dL — ABNORMAL HIGH (ref 70–99)

## 2010-10-06 LAB — IRON AND TIBC
Iron: 50
TIBC: 205 — ABNORMAL LOW
UIBC: 155

## 2010-10-06 LAB — ABO/RH: ABO/RH(D): AB POS

## 2010-10-07 ENCOUNTER — Other Ambulatory Visit: Payer: Self-pay | Admitting: Vascular Surgery

## 2010-10-07 DIAGNOSIS — I70269 Atherosclerosis of native arteries of extremities with gangrene, unspecified extremity: Secondary | ICD-10-CM

## 2010-10-07 HISTORY — PX: FOOT AMPUTATION THROUGH METATARSAL: SHX644

## 2010-10-07 LAB — GLUCOSE, CAPILLARY
Glucose-Capillary: 191 mg/dL — ABNORMAL HIGH (ref 70–99)
Glucose-Capillary: 203 mg/dL — ABNORMAL HIGH (ref 70–99)
Glucose-Capillary: 220 mg/dL — ABNORMAL HIGH (ref 70–99)

## 2010-10-07 LAB — CROSSMATCH
Antibody Screen: NEGATIVE
Unit division: 0

## 2010-10-07 LAB — CBC
HCT: 34 % — ABNORMAL LOW (ref 36.0–46.0)
Hemoglobin: 10.4 g/dL — ABNORMAL LOW (ref 12.0–15.0)
Hemoglobin: 10.1 — ABNORMAL LOW
MCHC: 32.6
RBC: 4.19 MIL/uL (ref 3.87–5.11)
RBC: 3.56 — ABNORMAL LOW
WBC: 9.9 10*3/uL (ref 4.0–10.5)

## 2010-10-07 LAB — RENAL FUNCTION PANEL
Albumin: 2.8 — ABNORMAL LOW
BUN: 62 — ABNORMAL HIGH
CO2: 22
Calcium: 8.1 — ABNORMAL LOW
Chloride: 110
Creatinine, Ser: 3.36 — ABNORMAL HIGH
GFR calc Af Amer: 17 — ABNORMAL LOW
GFR calc non Af Amer: 14 — ABNORMAL LOW

## 2010-10-07 LAB — IRON AND TIBC
Iron: 55
Saturation Ratios: 25
TIBC: 224 — ABNORMAL LOW

## 2010-10-07 LAB — BASIC METABOLIC PANEL
BUN: 18 mg/dL (ref 6–23)
CO2: 27 mEq/L (ref 19–32)
Chloride: 99 mEq/L (ref 96–112)
Glucose, Bld: 196 mg/dL — ABNORMAL HIGH (ref 70–99)
Potassium: 3.8 mEq/L (ref 3.5–5.1)
Sodium: 136 mEq/L (ref 135–145)

## 2010-10-07 LAB — FERRITIN: Ferritin: 343 — ABNORMAL HIGH (ref 10–291)

## 2010-10-07 NOTE — Op Note (Signed)
[name], [name] NO.:  [id number]  MEDICAL RECORD NO.:  [id number]  LOCATION:  6526                         FACILITY:  [facility]  PHYSICIAN:  Di Kindle. Edilia Bo, M.D.DATE OF BIRTH:  08/15/1945  DATE OF PROCEDURE:  10/07/2010 DATE OF DISCHARGE:                              OPERATIVE REPORT   PREOPERATIVE DIAGNOSIS:  Gangrene of the right first, second, third, fourth toes with diabetic foot infection.  POSTOPERATIVE DIAGNOSIS:  Gangrene of the right first, second, third, fourth toes with diabetic foot infection.  PROCEDURE:  Right transmetatarsal amputation.  SURGEON:  Di Kindle. Edilia Bo, M.D.  ASSISTANT:  Nurse.  ANESTHESIA:  General with LMA.  INDICATIONS:  This is a 65 year old woman who presented with extensive gangrene of the right great toe and also discoloration of the second, third and fourth toes.  She underwent an arteriogram which showed she had severe tibial occlusive disease with single vessel runoff via the anterior tibial artery which had a focal stenosis which was successfully angioplastied by Dr. Myra Gianotti but was asked to proceed with amputation of the toes.  I discussed with the patient preoperatively that if we found that the second, third and fourth toes were also involved that I would recommend transmetatarsal amputation, and she was agreeable to this.  TECHNIQUE:  The patient was taken to the operating room and received general anesthetic.  The right foot was prepped and draped in the usual sterile fashion.  The right great toe had wet gangrene.  The second, third and fourth toes had dry gangrene at the tips with blistering on the dorsum of the toes.  These blisters were debrided and clearly the second, third and fourth toes were also not viable with extensive purulence noted.  I therefore elected to proceed with transmetatarsal amputation.  An incision was made on the dorsum of the foot over the distal metatarsals.  The  incision was extended on the plantar aspect of the foot creating a longer flap on the plantar aspect where the skin appeared viable.  The dissection was carried down to the metatarsal bones.  The periosteum was elevated, and the bone was divided using a reciprocating saw.  The forefoot was then removed.  Hemostasis was obtained using electrocautery.  The sesamoid bones were excised, and the tendons retracted and excised.  Several 3-0 Vicryl sutures were used for hemostasis.  There was good bleeding of the wound, and the wound was then irrigated with copious amounts of saline.  There was one area on the dorsum of foot where the infection tracked along a tendon sheath, and I left this one small area open and the remainder of the wound was closed with deep layer of 3-0 Vicryl, and the skin closed with interrupted 3-0 nylons.  The one open area was packed with a 2 x 2 soaked in saline and then a sterile dressing was applied.  The patient tolerated procedure well and was transferred to the recovery room in stable condition.  All needle and sponge counts were correct.     Di Kindle. Edilia Bo, M.D.     CSD/MEDQ  D:  10/07/2010  T:  10/07/2010  Job:  [id number]  Electronically Signed by  Waverly Ferrari M.D. on 10/07/2010 12:44:27 PM

## 2010-10-08 ENCOUNTER — Inpatient Hospital Stay (HOSPITAL_COMMUNITY): Payer: Medicare Other

## 2010-10-08 LAB — CBC
HCT: 26.7 % — ABNORMAL LOW (ref 36.0–46.0)
MCH: 24.6 pg — ABNORMAL LOW (ref 26.0–34.0)
MCHC: 30.3 g/dL (ref 30.0–36.0)
MCV: 81.2 fL (ref 78.0–100.0)
Platelets: 250 10*3/uL (ref 150–400)
RDW: 19.6 % — ABNORMAL HIGH (ref 11.5–15.5)

## 2010-10-08 LAB — RENAL FUNCTION PANEL
Albumin: 2 g/dL — ABNORMAL LOW (ref 3.5–5.2)
BUN: 29 mg/dL — ABNORMAL HIGH (ref 6–23)
Calcium: 8 mg/dL — ABNORMAL LOW (ref 8.4–10.5)
Creatinine, Ser: 6.49 mg/dL — ABNORMAL HIGH (ref 0.50–1.10)
GFR calc non Af Amer: 6 mL/min — ABNORMAL LOW (ref >60)
Phosphorus: 4.4 mg/dL (ref 2.3–4.6)

## 2010-10-08 NOTE — Consult Note (Signed)
[name], TWINING               ACCOUNT NO.:  [id number]  MEDICAL RECORD NO.:  [id number]  LOCATION:                                 FACILITY:  PHYSICIAN:  Juleen China IV, MDDATE OF BIRTH:  September 27, 1945  DATE OF CONSULTATION: DATE OF DISCHARGE:                                CONSULTATION   REASON FOR CONSULTATION:  Right great toe pain and fever.  HISTORY:  [name] is a 65 year old female with end-stage renal disease on hemodialysis.  She also suffers from type 2 diabetes.  She is followed by Dr. Everardo All as her endocrinologist.  She went in to his office earlier today with fever and right great toe ulcer and pain.  She was treated with antibiotics and comes in to the hospital today.  She states the right great toe ulcer has been going on for about a month, however, she is now having difficulty with walking.  ___________ approximately 24 hours to go.  The patient suffers from ischemic cardiomyopathy.  She has an ejection fraction of 25%.  She is medically managed for hyperlipidemia.  She is morbidly obese.  PAST MEDICAL HISTORY: 1. End-stage renal disease. 2. Type 2 diabetes. 3. Diastolic congestive heart failure. 4. Morbid obesity. 5. Dyslipidemia. 6. Ischemic cardiomyopathy. 7. History of atrial tachycardia.  PAST SURGICAL HISTORY: 1. Colostomy. 2. Revision of urostomy. 3. Left dialysis access.  ALLERGIES:  CONTRAST DYE and LISINOPRIL.  SOCIAL HISTORY:  No tobacco.  No alcohol.  FAMILY HISTORY:  Positive for diabetes and hypertension.  REVIEW OF SYSTEMS:  Please see admission history and physical on September 29, 2010, no changes.  PHYSICAL EXAMINATION:  VITAL SIGNS:  Blood pressure is 155/68, heart rate 99, temperature 99.5. GENERAL:  She is awake, alert, and no acute distress. HEENT:  Within normal limits. PULMONARY:  Respirations are nonlabored. CARDIOVASCULAR:  Regular rate and rhythm.  She has palpable pedal pulses. ABDOMEN:  Obese, but  soft. NEUROLOGIC:  She is intact. SKIN:  She has discoloration in the right great toe with ulceration on the tip.  DIAGNOSTIC STUDIES:  I had ordered an ultrasound which was done just prior to me examining the patient.  This shows that she has monophasic tibial waveforms.  ASSESSMENT AND PLAN:  Right great toe ulcer in the setting of vascular in sufficiency.  PLAN:  I discussed with the patient that this is a limb-threatening situation.  Even though I can palpate her pedal pulse, I am concerned that with monophasic waveforms by ultrasound that she does have underlying peripheral vascular disease with subtotal occlusion, most likely in the tibial vessels.  I think that the best course of action is to proceed with angiogram and to address any lesions that may be detected.  Hopefully, if we can augment her blood supply, we will require amputation.  At this point in time, the patient is refusing even a toe amputation.  I did discuss with her that we will do everything we can to preserve her toe, but I do not want not addressing her toes to cause her to require a more proximal amputation.  I will plan to proceed with her angiogram in the morning.  We  will keep her n.p.o. after midnight.  We will treat her contrast allergy with steroids, Benadryl, and H2 blocker.     Cynthia Ny, MD     VWB/MEDQ  D:  10/02/2010  T:  10/02/2010  Job:  [id number]  Electronically Signed by Arelia Longest IV MD on 10/08/2010 09:59:11 AM

## 2010-10-09 LAB — CBC
HCT: 30.7 % — ABNORMAL LOW (ref 36.0–46.0)
MCH: 24.9 pg — ABNORMAL LOW (ref 26.0–34.0)
MCHC: 30.3 g/dL (ref 30.0–36.0)
MCV: 82.3 fL (ref 78.0–100.0)
RDW: 19.7 % — ABNORMAL HIGH (ref 11.5–15.5)
WBC: 13.7 10*3/uL — ABNORMAL HIGH (ref 4.0–10.5)

## 2010-10-09 LAB — GLUCOSE, CAPILLARY: Glucose-Capillary: 205 mg/dL — ABNORMAL HIGH (ref 70–99)

## 2010-10-09 LAB — BASIC METABOLIC PANEL
BUN: 15 mg/dL (ref 6–23)
Calcium: 8.6 mg/dL (ref 8.4–10.5)
Chloride: 98 mEq/L (ref 96–112)
Creatinine, Ser: 3.94 mg/dL — ABNORMAL HIGH (ref 0.50–1.10)
GFR calc Af Amer: 14 mL/min — ABNORMAL LOW (ref >60)

## 2010-10-10 ENCOUNTER — Inpatient Hospital Stay (HOSPITAL_COMMUNITY): Payer: Medicare Other

## 2010-10-10 LAB — RENAL FUNCTION PANEL
Albumin: 2 g/dL — ABNORMAL LOW (ref 3.5–5.2)
Albumin: 2.8 — ABNORMAL LOW
Albumin: 2.6 — ABNORMAL LOW
BUN: 67 — ABNORMAL HIGH
BUN: 66 — ABNORMAL HIGH
CO2: 16 — ABNORMAL LOW
Calcium: 8.1 — ABNORMAL LOW
Chloride: 92 mEq/L — ABNORMAL LOW (ref 96–112)
Chloride: 111
Chloride: 111
Creatinine, Ser: 5.89 mg/dL — ABNORMAL HIGH (ref 0.50–1.10)
Creatinine, Ser: 3.65 — ABNORMAL HIGH
Creatinine, Ser: 4.03 — ABNORMAL HIGH
GFR calc Af Amer: 15 — ABNORMAL LOW
GFR calc non Af Amer: 7 mL/min — ABNORMAL LOW (ref >90)
GFR calc non Af Amer: 13 — ABNORMAL LOW
Glucose, Bld: 172 — ABNORMAL HIGH
Phosphorus: 3.1 mg/dL (ref 2.3–4.6)
Phosphorus: 4.9 — ABNORMAL HIGH
Potassium: 4.5 mEq/L (ref 3.5–5.1)
Potassium: 4.3

## 2010-10-10 LAB — CBC
HCT: 31.4 — ABNORMAL LOW
MCHC: 30.4 g/dL (ref 30.0–36.0)
MCV: 87
Platelets: 248 10*3/uL (ref 150–400)
Platelets: 277
RDW: 19.8 % — ABNORMAL HIGH (ref 11.5–15.5)
RDW: 15.2
WBC: 15.7 10*3/uL — ABNORMAL HIGH (ref 4.0–10.5)
WBC: 7.2

## 2010-10-10 LAB — GLUCOSE, CAPILLARY
Glucose-Capillary: 188 mg/dL — ABNORMAL HIGH (ref 70–99)
Glucose-Capillary: 212 mg/dL — ABNORMAL HIGH (ref 70–99)

## 2010-10-10 LAB — WOUND CULTURE

## 2010-10-10 LAB — PTH, INTACT AND CALCIUM: Calcium, Total (PTH): 7.3 — ABNORMAL LOW

## 2010-10-11 DIAGNOSIS — S98919A Complete traumatic amputation of unspecified foot, level unspecified, initial encounter: Secondary | ICD-10-CM

## 2010-10-11 DIAGNOSIS — I70269 Atherosclerosis of native arteries of extremities with gangrene, unspecified extremity: Secondary | ICD-10-CM

## 2010-10-11 LAB — RENAL FUNCTION PANEL
CO2: 17 mEq/L — ABNORMAL LOW (ref 19–32)
Chloride: 114 mEq/L — ABNORMAL HIGH (ref 96–112)
Creatinine, Ser: 4.19 mg/dL — ABNORMAL HIGH (ref 0.4–1.2)
GFR calc Af Amer: 13 mL/min — ABNORMAL LOW (ref >60)
GFR calc non Af Amer: 11 mL/min — ABNORMAL LOW (ref >60)
Glucose, Bld: 121 mg/dL — ABNORMAL HIGH (ref 70–99)
Sodium: 139 mEq/L (ref 135–145)

## 2010-10-11 LAB — BASIC METABOLIC PANEL
BUN: 12 mg/dL (ref 6–23)
CO2: 31 mEq/L (ref 19–32)
Chloride: 95 mEq/L — ABNORMAL LOW (ref 96–112)
Creatinine, Ser: 3.74 mg/dL — ABNORMAL HIGH (ref 0.50–1.10)
GFR calc Af Amer: 14 mL/min — ABNORMAL LOW (ref >90)
Potassium: 3.7 mEq/L (ref 3.5–5.1)

## 2010-10-11 LAB — CBC
HCT: 26.9 % — ABNORMAL LOW (ref 36.0–46.0)
Hemoglobin: 8.1 g/dL — ABNORMAL LOW (ref 12.0–15.0)
Hemoglobin: 11.2 g/dL — ABNORMAL LOW (ref 12.0–15.0)
MCV: 82 fL (ref 78.0–100.0)
MCV: 88.2 fL (ref 78.0–100.0)
RBC: 3.28 MIL/uL — ABNORMAL LOW (ref 3.87–5.11)
RBC: 3.97 MIL/uL (ref 3.87–5.11)
RDW: 20 % — ABNORMAL HIGH (ref 11.5–15.5)
WBC: 11.3 10*3/uL — ABNORMAL HIGH (ref 4.0–10.5)
WBC: 8.2 10*3/uL (ref 4.0–10.5)

## 2010-10-11 LAB — GLUCOSE, CAPILLARY: Glucose-Capillary: 181 mg/dL — ABNORMAL HIGH (ref 70–99)

## 2010-10-11 LAB — CULTURE, BLOOD (ROUTINE X 2)
Culture  Setup Time: 201209260419
Culture: NO GROWTH

## 2010-10-12 ENCOUNTER — Inpatient Hospital Stay (HOSPITAL_COMMUNITY): Payer: Medicare Other

## 2010-10-12 ENCOUNTER — Inpatient Hospital Stay (HOSPITAL_COMMUNITY)
Admission: RE | Admit: 2010-10-12 | Discharge: 2010-10-22 | DRG: 945 | Disposition: A | Discharge status: Home / Self Care | Payer: Medicare Other | Source: Other Acute Inpatient Hospital | Attending: Physical Medicine & Rehabilitation | Admitting: Physical Medicine & Rehabilitation

## 2010-10-12 DIAGNOSIS — I4891 Unspecified atrial fibrillation: Secondary | ICD-10-CM | POA: Diagnosis present

## 2010-10-12 DIAGNOSIS — S98919A Complete traumatic amputation of unspecified foot, level unspecified, initial encounter: Secondary | ICD-10-CM

## 2010-10-12 DIAGNOSIS — E1142 Type 2 diabetes mellitus with diabetic polyneuropathy: Secondary | ICD-10-CM | POA: Diagnosis present

## 2010-10-12 DIAGNOSIS — Z4789 Encounter for other orthopedic aftercare: Secondary | ICD-10-CM

## 2010-10-12 DIAGNOSIS — Z8551 Personal history of malignant neoplasm of bladder: Secondary | ICD-10-CM

## 2010-10-12 DIAGNOSIS — Z992 Dependence on renal dialysis: Secondary | ICD-10-CM

## 2010-10-12 DIAGNOSIS — F17200 Nicotine dependence, unspecified, uncomplicated: Secondary | ICD-10-CM | POA: Diagnosis present

## 2010-10-12 DIAGNOSIS — Z794 Long term (current) use of insulin: Secondary | ICD-10-CM

## 2010-10-12 DIAGNOSIS — E1149 Type 2 diabetes mellitus with other diabetic neurological complication: Secondary | ICD-10-CM | POA: Diagnosis present

## 2010-10-12 DIAGNOSIS — I120 Hypertensive chronic kidney disease with stage 5 chronic kidney disease or end stage renal disease: Secondary | ICD-10-CM | POA: Diagnosis present

## 2010-10-12 DIAGNOSIS — N2581 Secondary hyperparathyroidism of renal origin: Secondary | ICD-10-CM | POA: Diagnosis present

## 2010-10-12 DIAGNOSIS — Z5189 Encounter for other specified aftercare: Principal | ICD-10-CM

## 2010-10-12 DIAGNOSIS — A4902 Methicillin resistant Staphylococcus aureus infection, unspecified site: Secondary | ICD-10-CM | POA: Diagnosis present

## 2010-10-12 DIAGNOSIS — N186 End stage renal disease: Secondary | ICD-10-CM | POA: Diagnosis present

## 2010-10-12 DIAGNOSIS — E871 Hypo-osmolality and hyponatremia: Secondary | ICD-10-CM | POA: Diagnosis present

## 2010-10-12 DIAGNOSIS — E1159 Type 2 diabetes mellitus with other circulatory complications: Secondary | ICD-10-CM | POA: Diagnosis present

## 2010-10-12 DIAGNOSIS — Z933 Colostomy status: Secondary | ICD-10-CM

## 2010-10-12 DIAGNOSIS — E785 Hyperlipidemia, unspecified: Secondary | ICD-10-CM | POA: Diagnosis present

## 2010-10-12 DIAGNOSIS — I69959 Hemiplegia and hemiparesis following unspecified cerebrovascular disease affecting unspecified side: Secondary | ICD-10-CM

## 2010-10-12 DIAGNOSIS — D649 Anemia, unspecified: Secondary | ICD-10-CM | POA: Diagnosis present

## 2010-10-12 DIAGNOSIS — I70269 Atherosclerosis of native arteries of extremities with gangrene, unspecified extremity: Secondary | ICD-10-CM

## 2010-10-12 DIAGNOSIS — I96 Gangrene, not elsewhere classified: Secondary | ICD-10-CM | POA: Diagnosis present

## 2010-10-12 LAB — RENAL FUNCTION PANEL
CO2: 28 mEq/L (ref 19–32)
Chloride: 93 mEq/L — ABNORMAL LOW (ref 96–112)
GFR calc Af Amer: 8 mL/min — ABNORMAL LOW (ref >90)
GFR calc non Af Amer: 7 mL/min — ABNORMAL LOW (ref >90)
Glucose, Bld: 149 mg/dL — ABNORMAL HIGH (ref 70–99)
Sodium: 131 mEq/L — ABNORMAL LOW (ref 135–145)

## 2010-10-12 LAB — CBC
Hemoglobin: 8.1 g/dL — ABNORMAL LOW (ref 12.0–15.0)
Platelets: 302 10*3/uL (ref 150–400)
RBC: 3.3 MIL/uL — ABNORMAL LOW (ref 3.87–5.11)
WBC: 9.7 10*3/uL (ref 4.0–10.5)

## 2010-10-12 LAB — GLUCOSE, CAPILLARY
Glucose-Capillary: 146 mg/dL — ABNORMAL HIGH (ref 70–99)
Glucose-Capillary: 188 mg/dL — ABNORMAL HIGH (ref 70–99)

## 2010-10-12 NOTE — Op Note (Signed)
[name], BROSSMAN NO.:  [id number]  MEDICAL RECORD NO.:  [id number]  LOCATION:                                 FACILITY:  PHYSICIAN:  Janetta Hora. Fields, MD  DATE OF BIRTH:  Aug 08, 1945  DATE OF PROCEDURE:  09/30/2010 DATE OF DISCHARGE:                              OPERATIVE REPORT   PROCEDURE: 1. Aortogram with bilateral lower extremity runoff. 2. Angioplasty of right anterior tibial artery.  PREOPERATIVE DIAGNOSIS:  Nonhealing wound, right first toe.  POSTOPERATIVE DIAGNOSIS:  Nonhealing wound, right first toe.  ANESTHESIA:  Local with IV sedation.  OPERATIVE DETAILS:  After obtaining informed consent, the patient was taken to [facility] lab.  The patient was placed in supine position on angio table.  Both groins were prepped and draped in usual sterile fashion. Local anesthesia was infiltrated over the left common femoral artery. Introducer needle was used to selectively catheterize the left common femoral artery and a 0.035 Versacore wire threaded in the abdominal aorta under fluoroscopic guidance.  Next a 5-French sheath was placed over the guidewire and left common femoral artery.  A 5-French Omni flush catheter was then placed over this.  Abdominal aortogram obtained through the Omni flush catheter.  This shows bilateral single renal arteries which were atrophic distally but all patent.  Left and right common iliac arteries were patent.  The left and right external and internal iliac arteries were patent.  The left and right common femoral and profunda femoris arteries were patent.  These were heavily calcified.  The left and right superficial femoral arteries were patent bilaterally and heavily calcified.  The left and right popliteal arteries were patent.  In the right lower extremity, the right posterior tibial and peroneal arteries were occluded.  The anterior tibial artery was patent throughout its course and has the single vessel runoff to the  foot. This vessel filled the plantar arch and filled the lateral plantar artery retrograde.  There was minimal flow at the distal digital arteries.  There was 70-80% stenosis of the proximal third of the right anterior tibial artery.  In the left lower extremity, there were similar findings of the tibial vessels with occlusion of the posterior tibial peroneal arteries and a proximal stenosis of anterior tibial artery.  The anterior tibial artery is in continuity with in line flow all the way to left foot.  At this point, it was decided to intervene on the stenosis of the anterior tibial artery on the right side.  The patient was given 5000 units of intravenous heparin.  After appropriate circulation time, the ACT was only slightly over 200, so an additional 3000 units of heparin were given.  The left femoral 5-French sheath was exchanged over an 0.035 wire for a 6 French Terumo sheath.  The right common iliac artery was selectively catheterized with a crossover catheter and the guidewire advanced down into the right superficial femoral artery.  The Terumo sheath was then advanced over the guidewire down into the right superficial femoral artery as a stable platform.  A CXI catheter and a Spartacore wire was then brought up in the operative field to switch over to an 0.014 system.  These were advanced as a unit down into the distal popliteal artery.  I then was able to manipulate the Spartacore wire into the anterior tibial artery and across the lesion.  The catheter was then advanced over the wire to get [place] a stable platform in the anterior tibial artery.  Roadmapping and extra imaging was obtained to make sure that we were at the precise location of the stenosis.  A 2 x 2 angioplasty balloon was then brought up in the operative field and inflated to 10 atmospheres but there was not really much of waist on this.  There was still stenosis present as well.  At this point a 2 x 3 mm  angioplasty balloon was brought up in the operative field and this was again advanced to the level of lesion and this was inflated to 16 atmospheres to give it a profile approximately 3.4 mm of diameter. There was a good waist on this and this resolved almost immediately. Completion arteriogram showed a widely patent anterior tibial artery with no significant residual stenosis.  There was still preserved runoff to the anterior tibial artery and dorsalis pedis artery to the right foot.  At this point, the angioplasty balloon and catheter were removed over the guidewire.  The 6-French Terumo sheath was then pulled back down into the pelvis over the guidewire.  The Spartacore wire was then removed.  The sheath was thoroughly flushed with heparinized saline. The patient's sheath was left in place to be pulled in the holding area as ACT was less than 175.  The patient tolerated the procedure well and there were no complications.  The patient was taken to the holding area in stable condition.  OPERATIVE FINDINGS: 1. 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 2. Stenosis of left anterior tibial artery in similar fashion to the     right which could be treated somewhat in the future if necessary.     She has open in line flow with no significant stenosis throughout     her entire aortoiliac, femoral, and popliteal system.     Janetta Hora. Fields, MD     CEF/MEDQ  D:  09/30/2010  T:  09/30/2010  Job:  [id number]  Electronically Signed by Fabienne Bruns MD on 10/12/2010 11:40:23 AM

## 2010-10-13 DIAGNOSIS — S98919A Complete traumatic amputation of unspecified foot, level unspecified, initial encounter: Secondary | ICD-10-CM

## 2010-10-13 DIAGNOSIS — I739 Peripheral vascular disease, unspecified: Secondary | ICD-10-CM

## 2010-10-13 DIAGNOSIS — L98499 Non-pressure chronic ulcer of skin of other sites with unspecified severity: Secondary | ICD-10-CM

## 2010-10-13 LAB — GLUCOSE, CAPILLARY: Glucose-Capillary: 191 mg/dL — ABNORMAL HIGH (ref 70–99)

## 2010-10-14 ENCOUNTER — Inpatient Hospital Stay (HOSPITAL_COMMUNITY): Payer: Medicare Other

## 2010-10-14 DIAGNOSIS — I739 Peripheral vascular disease, unspecified: Secondary | ICD-10-CM

## 2010-10-14 DIAGNOSIS — S98919A Complete traumatic amputation of unspecified foot, level unspecified, initial encounter: Secondary | ICD-10-CM

## 2010-10-14 DIAGNOSIS — L98499 Non-pressure chronic ulcer of skin of other sites with unspecified severity: Secondary | ICD-10-CM

## 2010-10-14 LAB — CBC
HCT: 34.9 — ABNORMAL LOW
HCT: 25.6 % — ABNORMAL LOW (ref 36.0–46.0)
Hemoglobin: 11.6 — ABNORMAL LOW
MCH: 25.2 pg — ABNORMAL LOW (ref 26.0–34.0)
MCHC: 30.9 g/dL (ref 30.0–36.0)
MCV: 83.2
MCV: 81.5 fL (ref 78.0–100.0)
Platelets: 252
Platelets: 271 10*3/uL (ref 150–400)
RDW: 19.8 % — ABNORMAL HIGH (ref 11.5–15.5)
WBC: 6.9

## 2010-10-14 LAB — RENAL FUNCTION PANEL
Albumin: 2.8 — ABNORMAL LOW
Albumin: 1.9 g/dL — ABNORMAL LOW (ref 3.5–5.2)
BUN: 65 — ABNORMAL HIGH
BUN: 24 mg/dL — ABNORMAL HIGH (ref 6–23)
Calcium: 8.2 — ABNORMAL LOW
Calcium: 8.4 mg/dL (ref 8.4–10.5)
Chloride: 110
Chloride: 95 mEq/L — ABNORMAL LOW (ref 96–112)
Creatinine, Ser: 3.9 — ABNORMAL HIGH
Creatinine, Ser: 6.09 mg/dL — ABNORMAL HIGH (ref 0.50–1.10)
GFR calc Af Amer: 14 — ABNORMAL LOW
GFR calc non Af Amer: 12 — ABNORMAL LOW
GFR calc non Af Amer: 7 mL/min — ABNORMAL LOW (ref >90)
Phosphorus: 4.3

## 2010-10-14 LAB — IRON AND TIBC
Saturation Ratios: 24
UIBC: 172

## 2010-10-14 LAB — PTH, INTACT AND CALCIUM
Calcium, Total (PTH): 8.2 — ABNORMAL LOW
PTH: 250.7 — ABNORMAL HIGH

## 2010-10-14 LAB — GLUCOSE, CAPILLARY: Glucose-Capillary: 107 mg/dL — ABNORMAL HIGH (ref 70–99)

## 2010-10-15 LAB — GLUCOSE, CAPILLARY
Glucose-Capillary: 184 mg/dL — ABNORMAL HIGH (ref 70–99)
Glucose-Capillary: 230 mg/dL — ABNORMAL HIGH (ref 70–99)
Glucose-Capillary: 187 mg/dL — ABNORMAL HIGH (ref 70–99)

## 2010-10-16 LAB — GLUCOSE, CAPILLARY
Glucose-Capillary: 164 mg/dL — ABNORMAL HIGH (ref 70–99)
Glucose-Capillary: 207 mg/dL — ABNORMAL HIGH (ref 70–99)
Glucose-Capillary: 190 mg/dL — ABNORMAL HIGH (ref 70–99)

## 2010-10-17 ENCOUNTER — Inpatient Hospital Stay (HOSPITAL_COMMUNITY): Payer: Medicare Other

## 2010-10-17 DIAGNOSIS — S98919A Complete traumatic amputation of unspecified foot, level unspecified, initial encounter: Secondary | ICD-10-CM

## 2010-10-17 DIAGNOSIS — I739 Peripheral vascular disease, unspecified: Secondary | ICD-10-CM

## 2010-10-17 DIAGNOSIS — L98499 Non-pressure chronic ulcer of skin of other sites with unspecified severity: Secondary | ICD-10-CM

## 2010-10-17 LAB — CBC
Platelets: 278 10*3/uL (ref 150–400)
RBC: 3.48 MIL/uL — ABNORMAL LOW (ref 3.87–5.11)
RDW: 20.2 % — ABNORMAL HIGH (ref 11.5–15.5)
WBC: 9 10*3/uL (ref 4.0–10.5)

## 2010-10-17 LAB — GLUCOSE, CAPILLARY
Glucose-Capillary: 125 mg/dL — ABNORMAL HIGH (ref 70–99)
Glucose-Capillary: 178 mg/dL — ABNORMAL HIGH (ref 70–99)
Glucose-Capillary: 102 mg/dL — ABNORMAL HIGH (ref 70–99)

## 2010-10-17 LAB — RENAL FUNCTION PANEL
Albumin: 2.2 g/dL — ABNORMAL LOW (ref 3.5–5.2)
CO2: 25 mEq/L (ref 19–32)
Chloride: 94 mEq/L — ABNORMAL LOW (ref 96–112)
GFR calc Af Amer: 5 mL/min — ABNORMAL LOW (ref >90)
GFR calc non Af Amer: 5 mL/min — ABNORMAL LOW (ref >90)
Sodium: 130 mEq/L — ABNORMAL LOW (ref 135–145)

## 2010-10-17 LAB — IRON AND TIBC: UIBC: 141 ug/dL (ref 125–400)

## 2010-10-17 NOTE — Discharge Summary (Signed)
[name], [name]               ACCOUNT NO.:  [id number]  MEDICAL RECORD NO.:  [id number]  LOCATION:  6526                         FACILITY:  [facility]  PHYSICIAN:  Hartley Barefoot, MD    DATE OF BIRTH:  [birth year]  DATE OF ADMISSION:  10/04/2010 DATE OF DISCHARGE:  10/12/2010                        DISCHARGE SUMMARY - REFERRING   DISCHARGE DIAGNOSIS:  Gangrene of the right first, second, third, fourth toe with diabetic foot infection status post right transmetatarsal amputation.  OTHER PAST MEDICAL HISTORY: 1. Diabetes. 2. End-stage renal disease. 3. Ischemic cardiomyopathy, ejection fraction 25%. 4. Left bundle-branch block. 5. Dyslipidemia. 6. Secondary hyperparathyroidism. 7. Morbid obesity. 8. History of diastolic heart failure.  DISCHARGE MEDICATIONS: 1. Ceftazidime 2 g IV Monday, Wednesday, and Friday during dialysis     for 2 weeks. 2. Aranesp 100 mcg IV Friday during dialysis. 3. Docusate 200 mg daily at bedtime.4. Hydrocodone one to two tablets by mouth every 4 hours as needed for     pain. 5. Insulin sliding scale 1-15 units subcu three times a day with     meals. 6. Lantus 13 units subcu daily. 7. Venofer 100 mg IV once a day during dialysis. 8. Metoprolol 50 mg daily at bedtime. 9. Protonix 40 mg p.o. b.i.d. 10.Zemplar 2 mcg IV Monday, Wednesday, and Friday during dialysis. 11.Vancomycin 1000 mg IV Monday, Wednesday, and Friday during     dialysis. 12.Amiodarone 200 mg 1 tablet by mouth daily. 13.Aspirin 81 mg p.o. daily. 14.Imdur 60 mg one tablet daily. 15.PhosLo 667 two tablets by mouth three times a day with meals. 16.Simvastatin 10 mg p.o. daily.  DISPOSITION:  [name] is going to be transferred to chronic inpatient rehab.  She will need 2 more weeks of IV antibiotics.  She will need to be followed by Dr. Edilia Bo also.  She will need to work with physical therapist.  BRIEF HISTORY OF PRESENT ILLNESS:  This is a very pleasant 65 year old with  multiple medical problems who was recently discharged from the hospital on October 03, 2010, with diagnosis of right great toe nonhealing with gangrene.  She had an arthrogram with runoff.  She also had an angioplasty done.  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 These were stented.  It was felt during that admission that the patient needed amputation, but the patient declined at that time.  The patient relayed that she went home the night prior to this admission and started to have bleed from the wound side.  She is now willing to discuss amputation.  PROBLEM: 1. Gangrene of the right first, second, third, and fourth toe with     diabetic foot infection.  The patient was admitted to the hospital.     She was continued on vancomycin and Ceftin.  The patient agreed to     have surgeries.  She had a right transmetatarsal amputation on     October 07, 2010.  She tolerated the procedure well.  The plan is     to continue the IV antibiotics for 2 more weeks.  She will need to     be followed by  Dr. Edilia Bo. 2. Diabetes.  I started the patient on Lantus.  I think that she will     benefit to be on Lantus instead of 50/50.  We can adjust Lantus as     needed.  Continue with a sliding scale insulin. 3. Anemia of chronic disease.  Continue with IV iron and Aranesp. 4. Ischemic cardiomyopathy, systolic heart failure, old left bundle-     branch block.  The patient had been stable from a cardiac point of     view, no heart failure exacerbation.  We will continue with her     amiodarone and beta-blockers.  The patient might be able to be transferred to rehab on October 12, 2010.  VITALS THE DAY PRIOR TO DISCHARGE:  Blood pressure 121/66, sats 95% on room air, respirations 20, pulse 91, temperature 98.4.  Sodium 133, potassium 3.7, chloride 95, CO2 of 31, glucose 198. Hemoglobin 8.1, white blood cell 11.3.  The  patient will need to have a CBC to follow up hemoglobin level.     Hartley Barefoot, MD     BR/MEDQ  D:  10/11/2010  T:  10/11/2010  Job:  [id number]  Electronically Signed by Hartley Barefoot MD on 10/17/2010 07:49:42 PM

## 2010-10-18 DIAGNOSIS — L98499 Non-pressure chronic ulcer of skin of other sites with unspecified severity: Secondary | ICD-10-CM

## 2010-10-18 DIAGNOSIS — S98919A Complete traumatic amputation of unspecified foot, level unspecified, initial encounter: Secondary | ICD-10-CM

## 2010-10-18 DIAGNOSIS — I739 Peripheral vascular disease, unspecified: Secondary | ICD-10-CM

## 2010-10-18 LAB — IRON AND TIBC
Iron: 59
TIBC: 214 — ABNORMAL LOW
UIBC: 155

## 2010-10-18 LAB — CBC
Hemoglobin: 10.3 — ABNORMAL LOW
MCHC: 32.5
RBC: 3.79 — ABNORMAL LOW
WBC: 8.4

## 2010-10-18 LAB — FERRITIN: Ferritin: 382 — ABNORMAL HIGH (ref 10–291)

## 2010-10-18 LAB — GLUCOSE, CAPILLARY
Glucose-Capillary: 244 mg/dL — ABNORMAL HIGH (ref 70–99)
Glucose-Capillary: 156 mg/dL — ABNORMAL HIGH (ref 70–99)

## 2010-10-19 ENCOUNTER — Inpatient Hospital Stay (HOSPITAL_COMMUNITY): Payer: Medicare Other

## 2010-10-19 LAB — CBC
HCT: 33.7 — ABNORMAL LOW
HCT: 29.3 % — ABNORMAL LOW (ref 36.0–46.0)
Hemoglobin: 11 — ABNORMAL LOW
MCHC: 30 g/dL (ref 30.0–36.0)
Platelets: 293 10*3/uL (ref 150–400)
RBC: 3.98
RDW: 16.8 — ABNORMAL HIGH
RDW: 20.3 % — ABNORMAL HIGH (ref 11.5–15.5)
WBC: 8.1
WBC: 8 10*3/uL (ref 4.0–10.5)

## 2010-10-19 LAB — RENAL FUNCTION PANEL
Albumin: 2.7 — ABNORMAL LOW
Albumin: 2.3 g/dL — ABNORMAL LOW (ref 3.5–5.2)
BUN: 27 mg/dL — ABNORMAL HIGH (ref 6–23)
CO2: 21
Calcium: 8.4
Chloride: 94 mEq/L — ABNORMAL LOW (ref 96–112)
Creatinine, Ser: 3.06 — ABNORMAL HIGH
GFR calc Af Amer: 19 — ABNORMAL LOW
GFR calc non Af Amer: 16 — ABNORMAL LOW
GFR calc non Af Amer: 5 mL/min — ABNORMAL LOW (ref >90)
Phosphorus: 4.5
Phosphorus: 4.1 mg/dL (ref 2.3–4.6)
Potassium: 4.4 mEq/L (ref 3.5–5.1)
Sodium: 141

## 2010-10-19 LAB — IRON AND TIBC
Saturation Ratios: 23
TIBC: 210 — ABNORMAL LOW
UIBC: 161

## 2010-10-19 LAB — GLUCOSE, CAPILLARY
Glucose-Capillary: 125 mg/dL — ABNORMAL HIGH (ref 70–99)
Glucose-Capillary: 147 mg/dL — ABNORMAL HIGH (ref 70–99)
Glucose-Capillary: 101 mg/dL — ABNORMAL HIGH (ref 70–99)

## 2010-10-19 LAB — FERRITIN: Ferritin: 388 — ABNORMAL HIGH (ref 10–291)

## 2010-10-19 NOTE — Discharge Summary (Signed)
  NAMEMarland Walls  INGRI, DIEMER NO.:  [id number]  MEDICAL RECORD NO.:  [id number]  LOCATION:  [facility]                         FACILITY:  [facility]  PHYSICIAN:  Baltazar Najjar, MD     DATE OF BIRTH:  07-Apr-1945  DATE OF ADMISSION:  09/29/2010 DATE OF DISCHARGE:  09/29/2010                              DISCHARGE SUMMARY   ADDENDUM  Please refer to the discharge summary dictated September 29, 2010, by the [facility] physician.  ADDENDUM TO HOSPITAL COURSE:  The patient was seen and examined by me on dialysis today.  She is feeling better.  Denies any complaints.  She was also seen by Dr. Myra Gianotti from Vascular Surgery and by Dr. Kathrene Bongo from Nephrology team.  The patient is set up for discharge to home today on vancomycin and Fortaz with dialysis to complete 2 weeks of therapy. Case manager will set up [facility] home [facility] services for PT and RN for wound care prior to discharge.  DISCHARGE MEDICATIONS: 1. Aranesp 100 mcg IV with dialysis on Fridays as per Renal. 2. Zemplar 2 mcg IV Monday, Wednesday, and Friday as per Renal. 3. Vancomycin 1000 mg IV Monday, Wednesday, and Friday with     hemodialysis to complete 2 weeks of therapy.  Vancomycin was     started on the hospital on October 01, 2010. 4. Humalog Mix 50/50, 25 units subcutaneously q.a.m. that was     increased from 20 units. 5. Elita Quick 1 g for hemodialysis on Mondays, Wednesdays, and Fridays for     12 more days to complete 2 weeks of therapy. 6. Amiodarone 200 mg p.o. daily. 7. PhosLo 667 mg 2 capsules p.o. three times a day with meals,     adjusted dose, was taking 3 capsules three times a day with meals     prior to admission. 8. Aspirin enteric coated 325 mg p.o. daily. 9. Imdur 60 mg 1 tablet p.o. daily. 10.Metoprolol 100 mg tablets, take half a tablet on dialysis days     Tuesdays, Thursdays, Saturdays, and Sundays. 11.Simvastatin 10 mg p.o. daily.  DISCHARGE INSTRUCTIONS: 1. Continue above  medications as prescribed. 2. Follow with [facility] for maintenance     hemodialysis as scheduled Mondays, Wednesdays, and Fridays. 3. Continue antibiotics to complete 2 weeks of therapy as above. 4. The patient to follow with Dr. Myra Gianotti with Vascular Surgery in 1     week of discharge. 5. The patient to follow with PCP preferably within 1 week of     discharge. 6. The patient to report any worsening of symptoms or any new symptoms     to PCP or come to the ER if needed. 7. Case manager will arrange for [facility] for PT and wound     care.          ______________________________ Baltazar Najjar, MD     SA/MEDQ  D:  10/03/2010  T:  10/03/2010  Job:  [id number]  cc:   Georgina Quint. Plotnikov, MD Sean A. Everardo All, MD Cecille Aver, M.D.  Electronically Signed by Hannah Beat MD on 10/19/2010 09:08:39 PM

## 2010-10-19 NOTE — H&P (Signed)
[name], JETT NO.:  [id number]  MEDICAL RECORD NO.:  [id number]  LOCATION:  4741                         FACILITY:  [facility]  PHYSICIAN:  Baltazar Najjar, MD     DATE OF BIRTH:  16-Sep-1945  DATE OF ADMISSION:  09/29/2010 DATE OF DISCHARGE:                             HISTORY & PHYSICAL   PRIMARY CARE PHYSICIAN:  Cynthia Quint. Plotnikov, MD  NEPHROLOGIST:  Cecille Aver, MD  ENDOCRINOLOGIST:  Cynthia Dunker Everardo All, MD  CODE STATUS:  The patient is full code.  CHIEF COMPLAINT:  Right great toe pain/fever.  HISTORY OF PRESENT ILLNESS:  Ms. [name] is a 65 year old woman with history of end-stage renal disease on maintenance hemodialysis, type 2 diabetes mellitus, and other multiple comorbidities as dictated below.  The patient was seen in her endocrinologist's office today for fever and right great toe ulcer and pain.  As per the patient, she had a temperature of 102 at dialysis yesterday.  She was given antibiotics and blood culture was sent.  The patient is complaining of the right great toe ulcer going on for about a month, however, pain and difficulty ambulating is worsening.  She never seeks any medical attention for this matter which has been going on for a month now.  She denies any discharge from the ulcer.  She stated that she had fever last night. The patient was sent directly for admission by the hospitalist from Dr. Harlow Asa office.  PAST MEDICAL HISTORY: 1. End-stage renal disease, on maintenance hemodialysis Monday,     Wednesday, Friday at [facility]. 2. Type 2 diabetes mellitus. 3. History of diastolic congestive heart failure. 4. Morbid obesity. 5. Dyslipidemia. 6. Ischemic cardiomyopathy, ejection fraction 25%. 7. History of atrial tachycardia.  PAST SURGICAL HISTORY: 1. Colostomy. 2. Revision urostomy. 3. Placement of left AV graft.  ALLERGIES:  Reported allergy to: 1. IODINE. 2.  LISINOPRIL.  SOCIAL HISTORY:  Lives with her daughter.  Denies smoking, EtOH, drinking, or illicit drug use.  FAMILY HISTORY:  Significant for diabetes and hypertension.  REVIEW OF SYSTEMS:  As above in HPI.  Other systems reported negative. CHEST:  She denies cough or shortness of breath.  CARDIOVASCULAR: Denies chest pain or palpitation.  ABDOMEN:  Denies nausea or vomiting or change in her bowel habits.  NEUROLOGY:  Denies headaches, dizziness, weakness, or numbness.  CONSTITUTIONAL:  Significant for fever and chills.  PHYSICAL EXAMINATION:  VITAL SIGNS:  Blood pressure 155/68, heart rate 99, temperature 99.5, and O2 sat 96% on room air. GENERAL:  She is alert and oriented x3. NECK:  Supple.  No JVD. LUNGS:  Clear to auscultation bilaterally.  No rales, no wheezing. CARDIOVASCULAR:  S1-S2.  Regular rhythm and rate. ABDOMEN:  Obese, soft, and nontender.  She does have colostomy bag and a urostomy bag in place. EXTREMITIES:  Right toe dry gangrene noted.  Cold to touch.  The rest of the right foot is warm to touch.  Right pedal pulses nonpalpable.  Left lower extremity:  No edema, warm to touch, and pedal pulses intact. NEUROLOGY:  She is alert and oriented x3.  No focal neurological deficits appreciated.  LABORATORY DATA:  None available at the time of dictation.  RADIOLOGY/IMAGING:  Non available at the time of dictation.  MEDICATIONS:  Still pending at the time of dictation.  ASSESSMENT AND PLAN: 1. Right great toe ulcer/gangrene. 2. Type 2 diabetes mellitus. 3. End-stage renal disease, on maintenance hemodialysis. 4. History of diastolic congestive heart failure, currently seems to     be compensated. 5. Diabetes mellitus type 2.  PLAN: 1. We will send 2 sets of blood cultures. 2. We will cover with Zosyn.  As per the patient, she received     antibiotic on dialysis yesterday which probably included vancomycin     as per protocol.  So, I will hold off vancomycin for  now and put     her on Zosyn. 3. Dr. Myra Gianotti from Vascular Surgery is consulted.  He recommended ABI     vascular studies.  We will order. 4. For diabetes, we will place her on insulin sliding scale for now     and monitor CBG, pending reconciliation of her home medications. 5. DVT and GI prophylaxis. 6. For dialysis, we will consult the Renal Team.  She will be due for     dialysis tomorrow. 7. The rest of plan of care will be as per her hospital progress.          ______________________________ Baltazar Najjar, MD     SA/MEDQ  D:  09/29/2010  T:  09/29/2010  Job:  [id number]  cc:   Cynthia Quint. Plotnikov, MD Cecille Walls, M.D. Sean A. Everardo All, MD  Electronically Signed by Hannah Beat MD on 10/19/2010 09:09:14 PM

## 2010-10-19 NOTE — Discharge Summary (Signed)
NAMEALVITA, FANA NO.:  [id number]  MEDICAL RECORD NO.:  [id number]  LOCATION:  4741                         FACILITY:  [facility]  PHYSICIAN:  Baltazar Najjar, MD     DATE OF BIRTH:  1945/05/30  DATE OF ADMISSION:  09/29/2010 DATE OF DISCHARGE:  10/02/2010                              DISCHARGE SUMMARY   FINAL DISCHARGE DIAGNOSES: 1. Right great toe nonhealing wound/gangrene 2. Type 2 diabetes mellitus, uncontrolled. 3. End-stage renal disease, on hemodialysis. 4. Leukocytosis secondary to right great toe nonhealing     wound/gangrene. 5. Anemia of end-stage renal disease. 6. Secondary hyperparathyroidism. 7. History of ischemic cardiomyopathy with EF 25%, currently     compensated.  CONSULTATIONS: 1. [facility]. 2. Vascular Surgery.  PROCEDURES: 1. Aortogram with bilateral lower extremity run off. 2. Angioplasty of the right anterior cerebral artery done on September 30, 2010. 3. Hemodialysis.  IMAGING STUDIES:  None.  BRIEF ADMITTING HISTORY:  Please refer to H and P for more details.  On summary, Ms. [name] is a 65 year old woman with history of end- stage renal disease on maintenance hemodialysis and diabetes and other multiple comorbidities as outlined in the H and P.  The patient was sent to the hospital for admission from her endocrinologist office for nonhealing right great toe ulceration and pain.  HOSPITAL COURSE ON PRESENTATION: 1. The patient was found to be febrile with a temperature of 102.  She     had leukocytosis with white blood count of 30.  Blood cultures were     sent and the patient was empirically treated with Zosyn and     vancomycin, noted that the patient received a dose of vancomycin on     dialysis the day before admission.  The patient was seen by     Vascular Surgery in consultation and she underwent aortogram and     angioplasty of the right anterior cerebral artery.  However,     Vascular  Surgery felt that right great toe amputation is indicated.     The patient refused the procedure.  The patient remained     hemodynamically stable on antibiotic, afebrile, her white count     trending down, planned to discharge her to home on vancomycin with     dialysis and p.o. Augmentin. 2. End-stage renal diseases, on maintenance hemodialysis.  The patient     was followed by the Renal team and she was being dialyzed as per     her routine schedule on Monday, Wednesday, and Friday.  She will be     dialyzed tomorrow and then discharged after dialysis. 3. Diabetes mellitus type 2, uncontrolled, insulin dose is being     adjusted.  She will need monitoring of her blood glucose level and     further adjustment as per her endocrinologist and PCP. 4. Anemia.  The patient to continue with Renal team.  The patient to     continue Aranesp and IV iron as per Renal team. 5. Secondary hyperparathyroidism.  The patient to continue Zemplar and     PhosLo as per Renal team noted that her phosphorus level noted  to     be in the low side and subsequently PhosLo dose was decreased with     2 tablets 3 times a day with meals instead of 3 tablets. 6. Hypertension, controlled. 7. Diastolic congestive heart failure, diastolic congestive heart     failure compensated.  DISCHARGE MEDICATION:  Will be added as an addendum to this discharge summary.  DISCHARGE INSTRUCTIONS: 1. The patient continue above medications as prescribed. 2. The patient to follow with [facility] for     dialysis as per her routine scheduled. 3. The patient to follow with her PCP and endocrinologist preferably     within 1 week of discharge. 4. The patient to monitor her blood glucose level at home regularly     and to discuss results with her endocrinologist, further adjustment     of her insulin regimen. 5. The patient to report any worsening of symptom, any new symptoms to     her PCP, or come to the ER  if needed.  CONDITION ON DISCHARGE:  Stable.          ______________________________ Baltazar Najjar, MD     SA/MEDQ  D:  10/02/2010  T:  10/02/2010  Job:  [id number]  cc:   Cecille Aver, M.D. Georgina Quint. Plotnikov, MD Sean A. Everardo All, MD  Electronically Signed by Hannah Beat MD on 10/19/2010 09:08:54 PM

## 2010-10-20 DIAGNOSIS — I739 Peripheral vascular disease, unspecified: Secondary | ICD-10-CM

## 2010-10-20 DIAGNOSIS — S98919A Complete traumatic amputation of unspecified foot, level unspecified, initial encounter: Secondary | ICD-10-CM

## 2010-10-20 DIAGNOSIS — L98499 Non-pressure chronic ulcer of skin of other sites with unspecified severity: Secondary | ICD-10-CM

## 2010-10-20 LAB — CBC
HCT: 32 — ABNORMAL LOW
Hemoglobin: 10.5 — ABNORMAL LOW
MCHC: 32.8
MCV: 83.6
Platelets: 239
RBC: 3.83 — ABNORMAL LOW
RDW: 16.5 — ABNORMAL HIGH
WBC: 6.9

## 2010-10-20 LAB — RENAL FUNCTION PANEL
BUN: 46 — ABNORMAL HIGH
Calcium: 8.3 — ABNORMAL LOW
Creatinine, Ser: 2.81 — ABNORMAL HIGH
Glucose, Bld: 110 — ABNORMAL HIGH
Phosphorus: 4.1

## 2010-10-20 LAB — FERRITIN: Ferritin: 416 — ABNORMAL HIGH (ref 10–291)

## 2010-10-20 LAB — IRON AND TIBC
Iron: 44
Saturation Ratios: 21
UIBC: 170

## 2010-10-20 LAB — GLUCOSE, CAPILLARY: Glucose-Capillary: 232 mg/dL — ABNORMAL HIGH (ref 70–99)

## 2010-10-21 ENCOUNTER — Inpatient Hospital Stay (HOSPITAL_COMMUNITY): Payer: Medicare Other

## 2010-10-21 LAB — CBC
MCH: 25 pg — ABNORMAL LOW (ref 26.0–34.0)
Platelets: 280 10*3/uL (ref 150–400)
RBC: 3.28 MIL/uL — ABNORMAL LOW (ref 3.87–5.11)
WBC: 8.7 10*3/uL (ref 4.0–10.5)

## 2010-10-21 LAB — RENAL FUNCTION PANEL
CO2: 27 mEq/L (ref 19–32)
Calcium: 9.1 mg/dL (ref 8.4–10.5)
Chloride: 91 mEq/L — ABNORMAL LOW (ref 96–112)
GFR calc Af Amer: 7 mL/min — ABNORMAL LOW (ref >90)
GFR calc non Af Amer: 6 mL/min — ABNORMAL LOW (ref >90)
Potassium: 4.2 mEq/L (ref 3.5–5.1)
Sodium: 131 mEq/L — ABNORMAL LOW (ref 135–145)

## 2010-10-21 LAB — GLUCOSE, CAPILLARY
Glucose-Capillary: 122 mg/dL — ABNORMAL HIGH (ref 70–99)
Glucose-Capillary: 195 mg/dL — ABNORMAL HIGH (ref 70–99)

## 2010-10-22 LAB — GLUCOSE, CAPILLARY: Glucose-Capillary: 211 mg/dL — ABNORMAL HIGH (ref 70–99)

## 2010-10-24 LAB — CBC
Hemoglobin: 11.2 — ABNORMAL LOW
MCHC: 32.5
RBC: 3.72 — ABNORMAL LOW
RDW: 15.7 — ABNORMAL HIGH
WBC: 7.2

## 2010-10-24 LAB — RENAL FUNCTION PANEL
Albumin: 3 — ABNORMAL LOW
CO2: 22
Calcium: 8.4
Creatinine, Ser: 2.49 — ABNORMAL HIGH
GFR calc non Af Amer: 19 — ABNORMAL LOW
Glucose, Bld: 153 — ABNORMAL HIGH
Phosphorus: 4.3
Potassium: 5.3 — ABNORMAL HIGH
Sodium: 137

## 2010-10-24 LAB — IRON AND TIBC
Iron: 48
TIBC: 202 — ABNORMAL LOW
TIBC: 212 — ABNORMAL LOW
UIBC: 164

## 2010-10-24 LAB — FERRITIN: Ferritin: 532 — ABNORMAL HIGH (ref 10–291)

## 2010-10-24 LAB — PTH, INTACT AND CALCIUM
Calcium, Total (PTH): 8.9
PTH: 184.7 — ABNORMAL HIGH

## 2010-10-25 LAB — GLUCOSE, CAPILLARY: Glucose-Capillary: 168 mg/dL — ABNORMAL HIGH (ref 70–99)

## 2010-10-28 ENCOUNTER — Encounter: Payer: Self-pay | Admitting: Endocrinology

## 2010-10-28 ENCOUNTER — Ambulatory Visit (INDEPENDENT_AMBULATORY_CARE_PROVIDER_SITE_OTHER): Payer: Medicare Other | Admitting: Endocrinology

## 2010-10-28 ENCOUNTER — Other Ambulatory Visit (INDEPENDENT_AMBULATORY_CARE_PROVIDER_SITE_OTHER): Payer: Medicare Other

## 2010-10-28 VITALS — BP 130/56 | HR 90 | Temp 97.0°F

## 2010-10-28 DIAGNOSIS — Z79899 Other long term (current) drug therapy: Secondary | ICD-10-CM

## 2010-10-28 DIAGNOSIS — E119 Type 2 diabetes mellitus without complications: Secondary | ICD-10-CM

## 2010-10-28 NOTE — Progress Notes (Signed)
Subjective:    Patient ID: [name], female    DOB: June 17, 1945, 65 y.o.   MRN: [id number]  HPI Pt had right transmetatarsal amputation 3 weeks ago.  She is feeling better now.  hh is caring for the wound.  She has few years of moderate thickening of the toe of the left foot, but no assoc numbness. Past Medical History  Diagnosis Date  . Other left bundle branch block   . Other and unspecified hyperlipidemia   . Mitral valve insufficiency and aortic valve insufficiency   . Cardiomegaly   . Unspecified essential hypertension   . Type II or unspecified type diabetes mellitus without mention of complication, not stated as uncontrolled   . Personal history of malignant neoplasm of large intestine   . Renal failure     ESRD, Dr Eliott Nine  . CHF (congestive heart failure)     Past Surgical History  Procedure Date  . Colostomy   . Revision urostomy cutaneous   . Esophagogastroduodenoscopy 03-18-04  . Electrocardiogram 04-27-06  . Placement of new left forearm arteriovenous graft 03-11-08  . Left heart catheterization and right heart catheterization 12-10    R. heart cath showed elevated left and right heart filling pressures w/ pulmonary artery pressure elevated mildly out of proportion to the wedge. The left heart cath showed diffuse distal vessel disease as well as a 75% stenosis in the mid circumflex w/ a 90% stenosis of the ostial first obtuse marginal. These lesions were in close proximity. there was a 60-70%mild RCA stenosis.     History   Social History  . Marital Status: Single    Spouse Name: N/A    Number of Children: N/A  . Years of Education: N/A   Occupational History  . Not on file.   Social History Main Topics  . Smoking status: Never Smoker   . Smokeless tobacco: Not on file  . Alcohol Use: No  . Drug Use: Not on file  . Sexually Active: No   Other Topics Concern  . Not on file   Social History Narrative  . No narrative on file    Current Outpatient  Prescriptions on File Prior to Visit  Medication Sig Dispense Refill  . amiodarone (PACERONE) 200 MG tablet Take 1 tablet (200 mg total) by mouth daily.  90 tablet  3  . budesonide-formoterol (SYMBICORT) 160-4.5 MCG/ACT inhaler Inhale 2 puffs into the lungs 2 (two) times daily.  1 Inhaler  3  . calcium acetate (PHOSLO) 667 MG capsule Take 2,001 mg by mouth 3 (three) times daily. Take 2 capsules in the middle of each meal each day      . isosorbide mononitrate (IMDUR) 60 MG 24 hr tablet Take 1 tablet (60 mg total) by mouth daily.  30 tablet  6  . metoprolol (LOPRESSOR) 100 MG tablet Take 0.5 tablets (50 mg total) by mouth 2 (two) times daily.  30 tablet  11  . simvastatin (ZOCOR) 10 MG tablet Take 10 mg by mouth at bedtime.        . promethazine-codeine (PHENERGAN WITH CODEINE) 6.25-10 MG/5ML syrup Take 5 mLs by mouth every 4 (four) hours as needed.  240 mL  1    Allergies  Allergen Reactions  . Ace Inhibitors   . Lisinopril     REACTION: unspecified   Family History  Problem Relation Age of Onset  . Cancer Sister     colon  . Hypertension Other   . Diabetes Mother   .  Hypertension Mother   . Hypertension Father    BP 130/56  Pulse 90  Temp(Src) 97 F (36.1 C) (Oral)  SpO2 97%  Review of Systems denies hypoglycemia and     Objective:   Physical Exam VITAL SIGNS:  See vs page GENERAL: no distress.  In wheelchair Left foot: Pulses: dorsalis pedis intact  Feet: no deformity.  no ulcer.  normal color and temp.  no edema Neuro: sensation is intact to touch. Right foot: bandaged.     Assessment & Plan:  Dm.  Her insulin requirements are prob less, due to her presumed weight loss, with recent illness.  therapy limited by pt's need for a simple regimen Renal failure.  She needs to back to her shorter-acting insulin, which has worked well for her in the past. Onychomycosis.  She would benefit from rx, due to her pad and neuropathy.

## 2010-10-28 NOTE — Patient Instructions (Addendum)
A liver blood test is being requested for you today.  please call [phone] to hear your test results.  You will be prompted to enter the 9-digit "MRN" number that appears at the top left of this page, followed by #.  Then you will hear the message. If the blood test is ok, i'll prescribe the pill for the toenail fungus, to [facility] [address].  Change lantus and novolog back to humalog 50/50, 15 units each morning.  This is less than what you were on before you got sick, because i believe you have lost weight, and therefore need less now.   Please come back for a follow-up appointment in 3 weeks. check your blood sugar 2 times a day.  vary the time of day when you check, between before the 3 meals, and at bedtime.  also check if you have symptoms of your blood sugar being too high or too low.  please keep a record of the readings and bring it to your next appointment here.  please call us sooner if you are having low blood sugar episodes, or if it stays over 200.

## 2010-10-31 LAB — HEPATIC FUNCTION PANEL
AST: 14 U/L (ref 0–37)
Albumin: 3.1 g/dL — ABNORMAL LOW (ref 3.5–5.2)
Alkaline Phosphatase: 104 U/L (ref 39–117)
Total Bilirubin: 0.4 mg/dL (ref 0.3–1.2)

## 2010-11-03 ENCOUNTER — Telehealth: Payer: Self-pay | Admitting: *Deleted

## 2010-11-03 MED ORDER — TERBINAFINE HCL 250 MG PO TABS: ORAL_TABLET | ORAL | Status: DC

## 2010-11-03 NOTE — Telephone Encounter (Signed)
Live is ok.  rx is sent.  Dosage is reduced due to your also being on amiodarone

## 2010-11-03 NOTE — Telephone Encounter (Signed)
Pt left message requesting lab results of liver function panel and whether or not she needs medication as dicussed at OV last week-please advise

## 2010-11-03 NOTE — Telephone Encounter (Signed)
Pt's daughter informed of MD's advisement.  

## 2010-11-04 DIAGNOSIS — D631 Anemia in chronic kidney disease: Secondary | ICD-10-CM

## 2010-11-04 DIAGNOSIS — N186 End stage renal disease: Secondary | ICD-10-CM

## 2010-11-04 DIAGNOSIS — Z9181 History of falling: Secondary | ICD-10-CM

## 2010-11-04 DIAGNOSIS — I69959 Hemiplegia and hemiparesis following unspecified cerebrovascular disease affecting unspecified side: Secondary | ICD-10-CM

## 2010-11-04 DIAGNOSIS — I120 Hypertensive chronic kidney disease with stage 5 chronic kidney disease or end stage renal disease: Secondary | ICD-10-CM

## 2010-11-04 DIAGNOSIS — N039 Chronic nephritic syndrome with unspecified morphologic changes: Secondary | ICD-10-CM

## 2010-11-04 DIAGNOSIS — Z933 Colostomy status: Secondary | ICD-10-CM

## 2010-11-08 ENCOUNTER — Encounter: Payer: Self-pay | Admitting: Physician Assistant

## 2010-11-08 NOTE — Discharge Summary (Signed)
[name], TANKSLEY NO.:  [id number]  MEDICAL RECORD NO.:  [id number]  LOCATION:  4025                         FACILITY:  [facility]  PHYSICIAN:  Erick Colace, M.D.DATE OF BIRTH:  October 13, 1945  DATE OF ADMISSION:  10/12/2010 DATE OF DISCHARGE:  10/22/2010                              DISCHARGE SUMMARY   DISCHARGE DIAGNOSES: 1. 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 2. Subcutaneous heparin for deep vein thrombosis prophylaxis. 3. Pain management. 4. Positive wound culture of methicillin-resistant Staphylococcus     aureus. 5. End-stage renal disease with hemodialysis. 6. Hypertension. 7. Insulin-dependent diabetes mellitus. 8. Chronic anemia. 9. History of colon and bladder cancer with colostomy as well as     urostomy. 10.Hyperlipidemia. A 65 year old black female with end-stage renal disease with hemodialysis, admitted on October 04, 2010 with nonhealing right first, second, third and fourth toes with gangrenous changes. Arteriogram showed severe tibial occlusive disease.  Underwent right transmetatarsal amputation on October 07, 2010 per Dr. Edilia Bo. Partial weightbearing, right heel, with a Darco boot.  Subcutaneous heparin for deep vein thrombosis prophylaxis.  Wound cultures with MRSA. Placed on intravenous Elita Quick as well as vancomycin x2 weeks. Hemodialysis ongoing per [facility].  Dressing changes as advised to right foot.  She was moderate assist for bed mobility and transfers.  She was admitted for comprehensive rehab program.  PAST MEDICAL HISTORY:  See discharge diagnoses.  No alcohol or tobacco.  ALLERGIES: 1. ANGIOTENSIN RECEPTORS. 2. FLUORESCEIN.  SOCIAL HISTORY:  Lives with daughter.  Daughter is in and out.  Family plans to assist as needed.  One-level home, 1 step to entry.  FUNCTIONAL HISTORY PRIOR TO ADMISSION:  Independent with a walker.  She does not  drive.  FUNCTIONAL STATUS UPON ADMISSION TO REHAB SERVICES:  Moderate assist bed mobility and transfers, moderate assist to stand, partial weightbearing, right lower extremity.  MEDICATIONS PRIOR TO ADMISSION:  Amiodarone, metoprolol, Zocor, Humalog Mix 50/50 insulin, Imdur, aspirin, PhosLo.  PHYSICAL EXAMINATION:  VITAL SIGNS:  Blood pressure 121/62, pulse 77, temperature 98.1, respirations 19. GENERAL:  This was an alert female, in no acute distress, flat affect. NEUROLOGIC:  Deep tendon reflexes hypoactive.  Right TMA site dressed per Vascular Surgery.  Sensation decreased to light touch distally. ABDOMEN:  Noted colostomy/urostomy. LUNGS:  Clear to auscultation. CARDIAC:  Regular rate and rhythm. ABDOMEN:  Soft, nontender.  Good bowel sounds.  REHABILITATION HOSPITAL COURSE:  The patient was admitted to Inpatient Rehab Services with therapies initiated on a 3-hour daily basis consisting of physical therapy, occupational therapy, and rehabilitation nursing.  The following issues were addressed during the patient's rehabilitation stay.  Pertaining to Mrs. Ode's right TMA amputation secondary to gangrene on October 07, 2010, surgical site healing with followup per Vascular Surgery, dressing changes as advised with a home health nurse arranged.  She remained on subcutaneous heparin for deep vein thrombosis prophylaxis throughout her rehab course.  Using Vicodin on limited basis for pain.  She remained on contact precautions for positive MRSA wound cultures remaining afebrile.  She had completed her course of vancomycin and Fortaz.  She remained on hemodialysis on Monday, Wednesday,  Friday per [facility].  Her blood pressures were well controlled with current regimen with no orthostatic changes.  Her blood sugars were monitored on Lantus insulin with full diabetic teaching completed.  She did have a history of colostomy and urostomy from history of colon and  bladder cancer.  She was independent with this care.  The patient received weekly collaborative interdisciplinary team conferences to discuss estimated length of stay, family teaching, and any barriers to discharge.  She was supervision setup for bathing and dressing, supervision transfers, supervision and minimal assist overall for mobility transfers and propelling her wheelchair with supervision.  She was supervision and minimal assist to ambulate 40 feet with a rolling walker, limited due to partial weightbearing to the right heel.  Overall strength and endurance had greatly improved.  She was encouraged with overall progress.  Full family teaching was completed.  She was discharged to home.  DISCHARGE MEDICATIONS: 1. Colace 200 mg daily. 2. Protonix 40 mg twice daily. 3. Zocor 10 mg daily. 4. Lantus insulin 15 units daily. 5. Lopressor 25 units at bedtime. 6. PhosLo 667 mg 3 times daily. 7. Amiodarone 200 mg daily. 8. Imdur 60 mg daily. 9. Aranesp as advised weekly with hemodialysis. 10.Oxycodone immediate release 5 mg 1 or 2 tablets every 4 hours as     needed for pain, dispense of 60 tablets.  DIET:  Renal diabetic diet.  SPECIAL INSTRUCTIONS:  Continue dialysis as advised per [facility].  Dressing changes to the right TMA site twice daily, pack open area on medial side with 2 x 2 gauze dressing, use a Q-Tip to pack deep into the wound, then a 4 x 4 dressing with Kerlix and a 4-inch Ace bandage.  Follow up with Dr. Waverly Ferrari, Vascular Surgery, call for appointment.  Home health therapies had been arranged.     Mariam Dollar, P.A.   ______________________________ Erick Colace, M.D.    DA/MEDQ  D:  10/21/2010  T:  10/21/2010  Job:  [id number]  cc:   Georgina Quint. Plotnikov, MD Di Kindle. Edilia Bo, M.D. Garnetta Buddy, M.D. Erick Colace, M.D.  Electronically Signed by Mariam Dollar P.A. on 10/21/2010 24:40:10  PM Electronically Signed by Claudette Laws M.D. on 11/08/2010 12:30:39 PM

## 2010-11-09 ENCOUNTER — Ambulatory Visit (INDEPENDENT_AMBULATORY_CARE_PROVIDER_SITE_OTHER): Payer: Medicare Other | Admitting: Physician Assistant

## 2010-11-09 ENCOUNTER — Encounter: Payer: Self-pay | Admitting: Physician Assistant

## 2010-11-09 VITALS — BP 119/49 | HR 66 | Resp 18

## 2010-11-09 DIAGNOSIS — L98499 Non-pressure chronic ulcer of skin of other sites with unspecified severity: Secondary | ICD-10-CM

## 2010-11-09 DIAGNOSIS — N186 End stage renal disease: Secondary | ICD-10-CM

## 2010-11-09 DIAGNOSIS — I739 Peripheral vascular disease, unspecified: Secondary | ICD-10-CM

## 2010-11-09 NOTE — Progress Notes (Signed)
[facility] OFFICE NOTE  CC:  F/u to surgery  HPI:  This is a 65 y.o. female here for f/u for right transmetatarsal amputation secondary to gangrene on 10/07/10.  She is doing quite well.  Her incision was being packed with 2x2 gauze, but has healed in and no longer require packing.  She denies any drainage.  She denies fever or chills.  She is ambulatory with a Darco shoe.  Allergies  Allergen Reactions  . Ace Inhibitors   . Enalapril   . Lisinopril     REACTION: unspecified  . Omnipaque (Iohexol)     Current Outpatient Prescriptions  Medication Sig Dispense Refill  . amiodarone (PACERONE) 200 MG tablet Take 1 tablet (200 mg total) by mouth daily.  90 tablet  3  . aspirin 81 MG chewable tablet Chew 81 mg by mouth daily.        . budesonide-formoterol (SYMBICORT) 160-4.5 MCG/ACT inhaler Inhale 2 puffs into the lungs 2 (two) times daily.  1 Inhaler  3  . calcium acetate (PHOSLO) 667 MG capsule Take 2,001 mg by mouth 3 (three) times daily. Take 2 capsules in the middle of each meal each day      . insulin lispro protamine-insulin lispro (HUMALOG 50/50) (50-50) 100 UNIT/ML SUSP Inject 15 Units into the skin daily with breakfast.        . isosorbide mononitrate (IMDUR) 60 MG 24 hr tablet Take 1 tablet (60 mg total) by mouth daily.  30 tablet  6  . metoprolol (LOPRESSOR) 100 MG tablet Take 0.5 tablets (50 mg total) by mouth 2 (two) times daily.  30 tablet  11  . promethazine-codeine (PHENERGAN WITH CODEINE) 6.25-10 MG/5ML syrup Take 5 mLs by mouth every 4 (four) hours as needed.  240 mL  1  . simvastatin (ZOCOR) 10 MG tablet Take 10 mg by mouth at bedtime.        . terbinafine (LAMISIL) 250 MG tablet 1 tab 3x a week  30 tablet  0    ROS:  See HPI  Physical Exam:  Filed Vitals:   11/09/10 1546  BP: 119/49  Pulse: 66  Resp: 18   She has sutures in place.  Her wound is healing.  There are no open wounds or any areas suspicious for infection.  She has very dry skin on  the plantar surface of her foot.   She has a 1+ doppler signal of the DP and PT of the right foot.  Her left foot looks good with no open wounds or sores.  She has a palpable DP pulse on the right.  A/P:  This is a 65 y.o. female here for f/u for right transmetatarsal amputation secondary to gangrene on 10/07/10.  She will return to see Dr. Edilia Bo in 2 weeks and have her sutures removed at that time. She will continue current wound care.  I advised her that she may use lotion on the plantar surface of her foot, but not on the area of the transmetatarsal amputation.  She expressed understanding.  Newton Pigg, PA-C  Clinic MD:  Edilia Bo

## 2010-11-09 NOTE — H&P (Signed)
NAMETIONDRA, FANG NO.:  [id number]  MEDICAL RECORD NO.:  [id number]  LOCATION:  4025                         FACILITY:  [facility]  PHYSICIAN:  Ranelle Oyster, M.D.DATE OF BIRTH:  19-Feb-1945  DATE OF ADMISSION:  10/12/2010 DATE OF DISCHARGE:                             HISTORY & PHYSICAL   CHIEF COMPLAINT:  Right foot pain and weakness.  PRIMARY CARE PHYSICIAN:  Georgina Quint. Plotnikov, MD  HISTORY OF PRESENT ILLNESS:  This is a 66 year old African American female with end-stage renal disease, on hemodialysis, admitted on September 25 with a nonhealing right foot with gangrenous changes. Arteriogram showed severe tibial occlusive disease and the patient ultimately underwent a right transmetatarsal amputation on September 28 by Dr. Edilia Bo.  She is partial weightbearing to the right heel with a Geographical information systems officer.  Subcu heparin was placed for DVT prophylaxis.  Wounds were positive for MRSA and she is placed on IV Fortaz and vancomycin on September 28 for a 2-week duration.  Dialysis has been ongoing.  She had dressing changes to the right foot per Vascular Surgery with packing required.  She has had some postoperative chronic anemia, which has been monitored only for now.  Pain has been an issue as well as inability to adhere to weightbearing precautions.  We were asked to evaluate this patient, and felt she could benefit from inpatient rehab stay.  REVIEW OF SYSTEMS:  Notable for weakness, numbness, decreased mood. Right lower extremity pain also.  Full 12 point review is in the written H&P.  PAST MEDICAL HISTORY:  Positive for the above as well as insulin- requiring diabetes; ischemic cardiomyopathy; ejection fraction of 25%; CHF; hypertension; colon cancer, colostomy in 1986; bladder cancer, urostomy in 1986; history of CVA, right hemiparesis; alcohol and tobacco use.  FAMILY HISTORY:  Positive for diabetes.  SOCIAL HISTORY:  The patient lives with  daughter, daughter is with her now.  Family can assist as needed, otherwise at home.  She has one-level house with one-step to enter.  ALLERGIES:  ANGIOTENSIN RECEPTORS, FLUORESCEIN.  MEDICATIONS AND LABS:  Please see written H and P.  PHYSICAL EXAM:  VITAL SIGNS:  Blood pressure 121/62, pulse 77, respiratory 19, temperature 98.1. GENERAL:  The patient is pleasant, alert. HEENT:  Pupils equal, round, react to light. NOSE AND THROAT:  Unremarkable, except for missing teeth. NECK:  Supple without JVD or lymphadenopathy. CHEST:  Clear to auscultation bilaterally without wheezes, rales, or rhonchi. HEART:  Regular rate and rhythm without murmur or gallops. ABDOMEN:  Soft, nontender.  Bowel sounds are positive.  The patient does have a colostomy/urostomy that is apparently functioning and intact. SKIN:  Notable for the right foot wound, which was well dressed.  It has been examined by the Vascular surgeon today so I deferred the exam on this foot today. EXTREMITIES:  Left lower extremity is grossly intact.  The patient does have diminished sensation distally.  The patient is morbidly obese. NEUROLOGIC:  Judgment, orientation, memory, and mood are generally appropriate.  Strength is 4/5 in upper extremity.  Right lower extremity.  She is 2/5 proximally, 1/5 at the ankle with pain inhibition noted.  Left lower extremity, she is 3/5  proximally, 4/5 distally.  POST ADMISSION PHYSICIAN EVALUATION: 1. Functional deficit secondary to right transmetatarsal amputation     due to gangrene.  The patient also with morbid obesity and chronic     diabetic peripheral neuropathy. 2. The patient is admitted to receive collaborative interdisciplinary     care between the physiatrist, rehab nursing staff, and therapy     team. 3. The patient's level of medical complexity and substantial therapy     needs in context of that medical necessity cannot be provided at a     lesser intensity of care. 4. The  patient has experienced substantial functional loss from her     baseline.  Premorbidly, she was independent.  Currently, she is mod-     assist bed mobility and transfers, mod-assist sit to stand and had     difficulty adhere to weightbearing precautions.  OT evaluations     have not yet been performed.  Judging by the patient's diagnosis,     physical exam and functional history, she has potential for     functional progress which will result in measurable gains while in     inpatient rehab.  These gains will be of substantial and practical     use upon discharge to home in facilitating mobility and self-care. 5. The physiatrist will provide 24-hour management of medical needs as     well as oversight of the therapy plan/treatment and provide     guidance as appropriate regarding interaction of the two.  Medical     problem list and plan are below. 6. A 24-hour rehab nursing team will assist in the management of the     patient's skin care needs as well as bowel and bladder function,     safety awareness, integration of therapy concepts and techniques. 7. PT will assess and treat for lower extremity strength, range of     motion, functional mobility, safety, weightbearing precautions with     goals modified independent supervision. 8. OT will assess and treat for upper extremity use ADLs, adaptive     techniques, equipment, functional mobility, safety, adherence to     weightbearing precautions with goals modified independent to min-     assist. 9. Case management and social worker will assess and treat for     psychosocial issues and discharge planning. 10.Team conference will be held weekly to assess progress towards     goals and to determine barriers to discharge. 11.The patient has demonstrated sufficient medical stability and     exercise capacity to tolerate at least 3 hours of therapy per day     at least 5 days per week. 12.Estimated length of stay is 10-14 days.  Prognosis  is good.  MEDICAL PROBLEM LIST AND PLAN: 1. Deep venous thrombosis prophylaxis with subcu heparin.  No active     bleeding at this time.  The patient is at risk for thrombus due to     her in activity and will continue this medication likely until     discharge. 2. Pain management with p.r.n. Vicodin.  The patient states that she     tolerates this well and this is quite effective for her pain.     Observe effect of this with increased activity on this floor. 3. Positive Methicillin-resistant Staphylococcus aureus in wound:  IV     Fortaz and vancomycin until October 12.  The patient will be on     contact precautions while here in the  unit. 4. End-stage renal disease:  The patient will receive hemodialysis     Monday, Wednesday, Friday per [facility].  Dialysis     will be scheduled after therapy to allow maximal participation     during the day. 5. Blood pressure, heart rate controlled with amiodarone, Imdur,     Lopressor.  Blood pressure and heart rate both controlled upon     examination today.  We will follow with increased activity, pain,     volume status, etc. 6. Insulin-requiring diabetes:  Check CBG in a.c. and h.s.  Cover with     sliding-scale insulin.  The patient is on Lantus insulin 13 units     daily which likely will need further adjustment. 7. Chronic anemia:  Aranesp. 8. History of colon/bladder cancer:  The patient with colostomy and     urostomy.  Routine care.  Wound care nurse is available as needed     for assistance.     Ranelle Oyster, M.D.     ZTS/MEDQ  D:  10/12/2010  T:  10/12/2010  Job:  [id number]  Electronically Signed by Faith Rogue M.D. on 11/09/2010 09:29:36 AM

## 2010-11-10 ENCOUNTER — Ambulatory Visit: Payer: Medicare Other

## 2010-11-29 ENCOUNTER — Encounter: Payer: Self-pay | Admitting: Vascular Surgery

## 2010-11-30 ENCOUNTER — Ambulatory Visit (INDEPENDENT_AMBULATORY_CARE_PROVIDER_SITE_OTHER): Payer: Medicare Other | Admitting: Vascular Surgery

## 2010-11-30 ENCOUNTER — Encounter: Payer: Self-pay | Admitting: Vascular Surgery

## 2010-11-30 VITALS — BP 190/75 | HR 94 | Temp 98.0°F | Ht 60.0 in | Wt 222.0 lb

## 2010-11-30 DIAGNOSIS — L98499 Non-pressure chronic ulcer of skin of other sites with unspecified severity: Secondary | ICD-10-CM

## 2010-11-30 DIAGNOSIS — I739 Peripheral vascular disease, unspecified: Secondary | ICD-10-CM

## 2010-11-30 DIAGNOSIS — S98919A Complete traumatic amputation of unspecified foot, level unspecified, initial encounter: Secondary | ICD-10-CM

## 2010-11-30 DIAGNOSIS — Z89439 Acquired absence of unspecified foot: Secondary | ICD-10-CM

## 2010-11-30 NOTE — Progress Notes (Signed)
[facility] [place]  Patient name: [name] MRN: [id number] DOB: [birth year] Sex: female  CC: follow up after right TMA  HPI: [name] is a 65 y.o. female who underwent a right transmetatarsal amputation on 10/07/2010. She comes in for a routine visit. She has no specific complaints referable to her surgery.  Past Medical History  Diagnosis Date  . Other left bundle branch block   . Other and unspecified hyperlipidemia   . Mitral valve insufficiency and aortic valve insufficiency   . Cardiomegaly   . Unspecified essential hypertension   . Type II or unspecified type diabetes mellitus without mention of complication, not stated as uncontrolled   . Personal history of malignant neoplasm of large intestine   . Renal failure     ESRD, Dr Eliott Nine  . CHF (congestive heart failure)   . Stroke   . Anemia   . Cancer     history of colon cancer    Family History  Problem Relation Age of Onset  . Cancer Sister     colon  . Hypertension Other   . Hypertension Mother   . Coronary artery disease Mother   . Hypertension Father   . Diabetes Father     SOCIAL HISTORY: History  Substance Use Topics  . Smoking status: Never Smoker   . Smokeless tobacco: Never Used  . Alcohol Use: No    Allergies  Allergen Reactions  . Ace Inhibitors   . Enalapril   . Lisinopril     REACTION: unspecified  . Omnipaque (Iohexol)     Current Outpatient Prescriptions  Medication Sig Dispense Refill  . amiodarone (PACERONE) 200 MG tablet Take 1 tablet (200 mg total) by mouth daily.  90 tablet  3  . aspirin 81 MG chewable tablet Chew 81 mg by mouth daily.        . budesonide-formoterol (SYMBICORT) 160-4.5 MCG/ACT inhaler Inhale 2 puffs into the lungs 2 (two) times daily.  1 Inhaler  3  . calcium acetate (PHOSLO) 667 MG capsule Take 2,001 mg by mouth 3 (three) times daily. Take 2 capsules in the middle of each meal each day      . insulin lispro protamine-insulin  lispro (HUMALOG 50/50) (50-50) 100 UNIT/ML SUSP Inject 15 Units into the skin daily with breakfast.        . isosorbide mononitrate (IMDUR) 60 MG 24 hr tablet Take 1 tablet (60 mg total) by mouth daily.  30 tablet  6  . metoprolol (LOPRESSOR) 100 MG tablet Take 50 mg by mouth 2 (two) times daily. Non dialysis days only       . promethazine-codeine (PHENERGAN WITH CODEINE) 6.25-10 MG/5ML syrup Take 5 mLs by mouth every 4 (four) hours as needed.  240 mL  1  . simvastatin (ZOCOR) 10 MG tablet Take 10 mg by mouth at bedtime.        . terbinafine (LAMISIL) 250 MG tablet 1 tab 3x a week  30 tablet  0    REVIEW OF SYSTEMS: Arly.Keller ] denotes positive finding; [  ] denotes negative finding CARDIOVASCULAR:  [ ]  chest pain   [ ]  chest pressure   [ ]  palpitations   [ ]  orthopnea   [ ]  dyspnea on exertion   [ ]  claudication   [ ]  rest pain   [ ]  DVT   [ ]  phlebitis PULMONARY:   [ ]  productive cough   [ ]  asthma   [ ]  wheezing  PHYSICAL EXAM: Filed Vitals:   11/30/10 1420  BP: 190/75  Pulse: 94  Temp: 98 F (36.7 C)  TempSrc: Oral  Height: 5' (1.524 m)  Weight: 222 lb (100.699 kg)  SpO2: 98%   Body mass index is 43.36 kg/(m^2). GENERAL: The patient is a well-nourished female, in no acute distress. The vital signs are documented above. CARDIOVASCULAR: There is a regular rate and rhythm without significant murmur appreciated.  PULMONARY: There is good air exchange bilaterally without wheezing or rales. Her right transmetatarsal amputation site is healing nicely and I removed her sutures today. Also took away some dead skin.  MEDICAL ISSUES: I'll see her back in one month. I have given her prescription to take the [employer] for a TMA shoe insert.  DICKSON,CHRISTOPHER S [facility] [place] Office: [phone]

## 2010-12-05 ENCOUNTER — Telehealth: Payer: Self-pay | Admitting: *Deleted

## 2010-12-05 NOTE — Telephone Encounter (Signed)
Caller left vm req verbal auth to dispense patients ostomy supplies. Called number provided [phone] Ref # [id number] and left vm with auth to dispense supplies.

## 2010-12-06 DIAGNOSIS — T8789 Other complications of amputation stump: Secondary | ICD-10-CM

## 2010-12-06 DIAGNOSIS — I509 Heart failure, unspecified: Secondary | ICD-10-CM

## 2010-12-06 DIAGNOSIS — I2589 Other forms of chronic ischemic heart disease: Secondary | ICD-10-CM

## 2010-12-06 DIAGNOSIS — E1159 Type 2 diabetes mellitus with other circulatory complications: Secondary | ICD-10-CM

## 2010-12-19 ENCOUNTER — Telehealth: Payer: Self-pay

## 2010-12-19 NOTE — Telephone Encounter (Signed)
Pt. requests pain medication.  Is s/p right transmetatarsal amputation of 10/07/2010.  States her pain was completely resolved from having surgery, and last night it started in the right ft. In area where small toe was.  Described as excruciating yesterday, but denies pain today.  Denies any redness, warmth, swelling, or open sore on right foot.   States "it felt like a nerve".  Advised pt. She would have to be reevaluated in office to determine need for pain medication, since she is 2 months post-op.  Advised to try ES Tylenol for pain, and call office if pain uncontrolled.  Also, will schedule pt. To be evaluated in office.  She is advised to call sooner if symptoms worsen.  Verb. Understanding.

## 2010-12-27 ENCOUNTER — Encounter: Payer: Self-pay | Admitting: Vascular Surgery

## 2010-12-28 ENCOUNTER — Encounter: Payer: Self-pay | Admitting: Vascular Surgery

## 2010-12-28 ENCOUNTER — Ambulatory Visit: Payer: Medicare Other | Admitting: Vascular Surgery

## 2010-12-28 VITALS — BP 128/74 | HR 101 | Resp 16 | Ht 60.0 in | Wt 220.0 lb

## 2010-12-28 DIAGNOSIS — N186 End stage renal disease: Secondary | ICD-10-CM

## 2010-12-28 NOTE — Progress Notes (Signed)
[facility]  Patient name: [name] MRN: [id number] DOB: Jun 23, 1945 Sex: female  CC: follow up after right transmetatarsal amputation.  HPI: [name] is a 66 y.o. 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 She had an arteriogram which showed severe tibial artery occlusive disease with single vessel runoff via the anterior tibial artery. Gen. Focal stenosis in this which was successfully ballooned by Dr. Myra Gianotti. I was asked to proceed with amputation of the toes. She had a right transmetatarsal amputation on 10/07/2010. He comes in for a routine visit. She's had no significant pain in the foot. She's been ambulating.  Past Medical History  Diagnosis Date  . Other left bundle branch block   . Other and unspecified hyperlipidemia   . Mitral valve insufficiency and aortic valve insufficiency   . Cardiomegaly   . Unspecified essential hypertension   . Type II or unspecified type diabetes mellitus without mention of complication, not stated as uncontrolled   . Personal history of malignant neoplasm of large intestine   . Renal failure     ESRD, Dr Eliott Nine  . CHF (congestive heart failure)   . Stroke   . Anemia   . Cancer     history of colon cancer    Family History  Problem Relation Age of Onset  . Cancer Sister     colon  . Hypertension Other   . Hypertension Mother   . Coronary artery disease Mother   . Hypertension Father   . Diabetes Father     SOCIAL HISTORY: History  Substance Use Topics  . Smoking status: Never Smoker   . Smokeless tobacco: Never Used  . Alcohol Use: No    Allergies  Allergen Reactions  . Ace Inhibitors   . Enalapril   . Lisinopril     REACTION: unspecified  . Omnipaque (Iohexol)     Current Outpatient Prescriptions  Medication Sig Dispense Refill  . amiodarone (PACERONE) 200 MG tablet Take 1 tablet (200 mg  total) by mouth daily.  90 tablet  3  . aspirin 81 MG chewable tablet Chew 81 mg by mouth daily.        . budesonide-formoterol (SYMBICORT) 160-4.5 MCG/ACT inhaler Inhale 2 puffs into the lungs 2 (two) times daily.  1 Inhaler  3  . calcium acetate (PHOSLO) 667 MG capsule Take 2,001 mg by mouth 3 (three) times daily. Take 2 capsules in the middle of each meal each day      . insulin lispro protamine-insulin lispro (HUMALOG 50/50) (50-50) 100 UNIT/ML SUSP Inject 15 Units into the skin daily with breakfast.        . isosorbide mononitrate (IMDUR) 60 MG 24 hr tablet Take 1 tablet (60 mg total) by mouth daily.  30 tablet  6  . metoprolol (LOPRESSOR) 100 MG tablet Take 50 mg by mouth 2 (two) times daily. Non dialysis days only       . simvastatin (ZOCOR) 10 MG tablet Take 10 mg by mouth at bedtime.        . terbinafine (LAMISIL) 250 MG tablet 1 tab 3x a week  30 tablet  0  . promethazine-codeine (PHENERGAN WITH CODEINE) 6.25-10 MG/5ML syrup Take 5 mLs by mouth every 4 (four) hours as needed.  240 mL  1    REVIEW OF SYSTEMS: Arly.Keller ] denotes positive finding; [  ] denotes negative finding CARDIOVASCULAR:  [ ]   chest pain   [ ]  chest pressure   [ ]  palpitations   [ ]  orthopnea   [ ]  dyspnea on exertion   [ ]  claudication   [ ]  rest pain   [ ]  DVT   [ ]  phlebitis PULMONARY:   [ ]  productive cough   [ ]  asthma   [ ]  wheezing  PHYSICAL EXAM: Filed Vitals:   12/28/10 1401  BP: 128/74  Pulse: 101  Resp: 16  Height: 5' (1.524 m)  Weight: 220 lb (99.791 kg)  SpO2: 97%   Body mass index is 42.97 kg/(m^2). GENERAL: The patient is a well-nourished female, in no acute distress. The vital signs are documented above. CARDIOVASCULAR: There is a regular rate and rhythm without significant murmur appreciated.  PULMONARY: There is good air exchange bilaterally without wheezing or rales. Her right transmetatarsal amputation site is healing nicely. There are no open areas. There is some dry skin.  MEDICAL  ISSUES: This amputation site appears to be healing nicely. I've instructed her to keep the skin well-lubricated. I'll see her back as needed.  Raymon Schlarb S [facility] Office: [phone]

## 2011-01-07 ENCOUNTER — Ambulatory Visit (INDEPENDENT_AMBULATORY_CARE_PROVIDER_SITE_OTHER): Payer: Medicare Other | Admitting: Internal Medicine

## 2011-01-07 ENCOUNTER — Encounter: Payer: Self-pay | Admitting: Internal Medicine

## 2011-01-07 VITALS — BP 128/74 | HR 98 | Temp 98.6°F | Resp 14 | Wt 212.2 lb

## 2011-01-07 DIAGNOSIS — H0015 Chalazion left lower eyelid: Secondary | ICD-10-CM

## 2011-01-07 DIAGNOSIS — H0019 Chalazion unspecified eye, unspecified eyelid: Secondary | ICD-10-CM

## 2011-01-07 NOTE — Progress Notes (Signed)
  Subjective:    Patient ID: [name], female    DOB: February 25, 1945, 65 y.o.   MRN: [id number]  HPI Ms. Crichlow presents with a "sty" on the left lower eyelid. It is painless and she has had no change in her vision. The lesion was actually noted by her daughter.   Past Medical History  Diagnosis Date  . Other left bundle branch block   . Other and unspecified hyperlipidemia   . Mitral valve insufficiency and aortic valve insufficiency   . Cardiomegaly   . Unspecified essential hypertension   . Type II or unspecified type diabetes mellitus without mention of complication, not stated as uncontrolled   . Personal history of malignant neoplasm of large intestine   . Renal failure     ESRD, Dr Eliott Nine  . CHF (congestive heart failure)   . Stroke   . Anemia   . Cancer     history of colon cancer   Past Surgical History  Procedure Date  . Colostomy   . Revision urostomy cutaneous   . Esophagogastroduodenoscopy 03-18-04  . Electrocardiogram 04-27-06  . Placement of new left forearm arteriovenous graft 03-11-08  . Left heart catheterization and right heart catheterization 12-10    R. heart cath showed elevated left and right heart filling pressures w/ pulmonary artery pressure elevated mildly out of proportion to the wedge. The left heart cath showed diffuse distal vessel disease as well as a 75% stenosis in the mid circumflex w/ a 90% stenosis of the ostial first obtuse marginal. These lesions were in close proximity. there was a 60-70%mild RCA stenosis.   . Arteriovenous graft placement 2010  . 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 Cancer Sister     colon  . Hypertension Other   . Hypertension Mother   . Coronary artery disease Mother   . Hypertension Father   . Diabetes Father    History   Social History  . Marital Status: Single    Spouse Name: N/A    Number of Children: N/A  . Years  of Education: N/A   Occupational History  . Not on file.   Social History Main Topics  . Smoking status: Never Smoker   . Smokeless tobacco: Never Used  . Alcohol Use: No  . Drug Use: No  . Sexually Active: No   Other Topics Concern  . Not on file   Social History Narrative  . No narrative on file       Review of Systems System review is negative for any constitutional, cardiac, pulmonary, GI or neuro symptoms or complaints other than as described in the HPI.     Objective:   Physical Exam Vitals reviewed - stable Gen'l - ovese AA woman in no distress HEENT - small swollen lesion on the lateral aspect of the left lower eyelid. , C&S clear, Pupils normal. NOrmal visual acuity.        Assessment & Plan:  chalzion - painless lesion on the eyelid Plan - information from UpToDate provided           Application of warm compresses.            For pain or persistence - Opthal consult

## 2011-01-07 NOTE — Patient Instructions (Signed)
This is a chalazion - see handouts. Plan - warm compresses.           For pain or enlargement may need to see your eye doctor.  Chalazion A chalazion is a swelling or hard lump on the eyelid caused by a blocked oil gland. Chalazions may occur on the upper or the lower eyelid.   CAUSES   Oil gland in the eyelid becomes blocked. SYMPTOMS    Swelling or hard lump on the eyelid. This lump may make it hard to see out of the eye.     The swelling may spread to areas around the eye.  TREATMENT    Although some chalazions disappear by themselves in 1 or 2 months, some chalazions may need to be removed.     Medicines to treat an infection may be required.  HOME CARE INSTRUCTIONS    Wash your hands often and dry them with a clean towel. Do not touch the chalazion.     Apply heat to the eyelid several times a day for 10 minutes to help ease discomfort and bring any yellowish white fluid (pus) to the surface. One way to apply heat to a chalazion is to use the handle of a metal spoon.     Hold the handle under hot water until it is hot, and then wrap the handle in paper towels so that the heat can come through without burning your skin.     Hold the wrapped handle against the chalazion and reheat the spoon handle as needed.     Apply heat in this fashion for 10 minutes, 4 times per day.     Return to your caregiver to have the pus removed if it does not break (rupture) on its own.     Do not try to remove the pus yourself by squeezing the chalazion or sticking it with a pin or needle.     Only take over-the-counter or prescription medicines for pain, discomfort, or fever as directed by your caregiver.  SEEK IMMEDIATE MEDICAL CARE IF:    You have pain in your eye.     Your vision changes.     The chalazion does not go away.     The chalazion becomes painful, red, or swollen, grows larger, or does not start to disappear after 2 weeks.  MAKE SURE YOU:    Understand these instructions.      Will watch your condition.     Will get help right away if you are not doing well or get worse.  Document Released: 12/24/1999 Document Revised: 09/07/2010 Document Reviewed: 04/12/2009 [facility] Patient Information 2012 [place], [place].

## 2011-02-09 DIAGNOSIS — N186 End stage renal disease: Secondary | ICD-10-CM | POA: Diagnosis not present

## 2011-03-09 DIAGNOSIS — N186 End stage renal disease: Secondary | ICD-10-CM | POA: Diagnosis not present

## 2011-04-11 ENCOUNTER — Encounter: Payer: Self-pay | Admitting: Internal Medicine

## 2011-04-11 ENCOUNTER — Ambulatory Visit (INDEPENDENT_AMBULATORY_CARE_PROVIDER_SITE_OTHER): Payer: Medicare Other | Admitting: Internal Medicine

## 2011-04-11 VITALS — BP 160/72 | HR 80 | Temp 97.0°F | Resp 16 | Ht 60.0 in | Wt 208.0 lb

## 2011-04-11 DIAGNOSIS — I639 Cerebral infarction, unspecified: Secondary | ICD-10-CM

## 2011-04-11 DIAGNOSIS — N19 Unspecified kidney failure: Secondary | ICD-10-CM

## 2011-04-11 DIAGNOSIS — E1165 Type 2 diabetes mellitus with hyperglycemia: Secondary | ICD-10-CM

## 2011-04-11 DIAGNOSIS — E785 Hyperlipidemia, unspecified: Secondary | ICD-10-CM

## 2011-04-11 DIAGNOSIS — Z0000 Encounter for general adult medical examination without abnormal findings: Secondary | ICD-10-CM | POA: Diagnosis not present

## 2011-04-11 DIAGNOSIS — I10 Essential (primary) hypertension: Secondary | ICD-10-CM | POA: Diagnosis not present

## 2011-04-11 DIAGNOSIS — E1129 Type 2 diabetes mellitus with other diabetic kidney complication: Secondary | ICD-10-CM | POA: Diagnosis not present

## 2011-04-11 DIAGNOSIS — L97509 Non-pressure chronic ulcer of other part of unspecified foot with unspecified severity: Secondary | ICD-10-CM

## 2011-04-11 DIAGNOSIS — E1169 Type 2 diabetes mellitus with other specified complication: Secondary | ICD-10-CM

## 2011-04-11 DIAGNOSIS — N058 Unspecified nephritic syndrome with other morphologic changes: Secondary | ICD-10-CM

## 2011-04-11 DIAGNOSIS — I6350 Cerebral infarction due to unspecified occlusion or stenosis of unspecified cerebral artery: Secondary | ICD-10-CM

## 2011-04-11 MED ORDER — ACTIVE LIFE CONVEX 1-PC 19MM POUCH MISC: 1.0000 | Status: DC

## 2011-04-11 MED ORDER — NATURA DURAHESIVE MOLDABLE WAFR: 1.0000 | WAFER | Status: DC

## 2011-04-11 MED ORDER — SUR-FIT NATURA UROSTOMY POUCH MISC: 1.0000 | Status: DC

## 2011-04-11 NOTE — Assessment & Plan Note (Signed)
S/p R toes amputation 10/12

## 2011-04-11 NOTE — Assessment & Plan Note (Signed)
Continue with current prescription therapy as reflected on the Med list.  

## 2011-04-11 NOTE — Assessment & Plan Note (Signed)
Cont w/HD 

## 2011-04-11 NOTE — Assessment & Plan Note (Signed)
The patient is here for annual Medicare wellness examination and management of other chronic and acute problems.   The risk factors are reflected in the social history.  The roster of all physicians providing medical care to patient - is listed in the Snapshot section of the chart.  Activities of daily living:  The patient is 100% inedpendent in all ADLs: dressing, toileting, feeding as well as independent mobility  Home safety : The patient has smoke detectors in the home. They wear seatbelts.No firearms at home ( firearms are present in the home, kept in a safe fashion). There is no violence in the home.   There is no risks for hepatitis, STDs or HIV. There is no   history of blood transfusion. They have no travel history to infectious disease endemic areas of the world.  The patient has (has not) seen their dentist in the last six month. They have (not) seen their eye doctor in the last year. They deny (admit to) any hearing difficulty and have not had audiologic testing in the last year.  They do not  have excessive sun exposure. Discussed the need for sun protection: hats, long sleeves and use of sunscreen if there is significant sun exposure.   Diet: the importance of a healthy diet is discussed. They do have a healthy (unhealthy-high fat/fast food) diet.  The patient has a regular exercise program: no. The benefits of regular aerobic exercise were discussed.  Depression screen: there are some signs or vegative symptoms of depression- irritability, change in appetite, anhedonia, sadness/tearfullness.  Cognitive assessment: the patient manages all their financial and personal affairs and is actively engaged. They could relate day,date,year and events; recalled 3/3 objects at 3 minutes; performed clock-face test normally.  The following portions of the patient's history were reviewed and updated as appropriate: allergies, current medications, past family history, past medical history,  past  surgical history, past social history  and problem list.  Vision, hearing, body mass index were assessed and reviewed.   During the course of the visit the patient was educated and counseled about appropriate screening and preventive services including : fall prevention , diabetes screening, nutrition counseling, colorectal cancer screening, and recommended immunizations.  Lives w/dtr

## 2011-04-11 NOTE — Progress Notes (Signed)
Patient ID: [name], female   DOB: [birth year], 66 y.o.   MRN: [id number]  Subjective:    Patient ID: [name], female    DOB: 12/02/1945, 66 y.o.   MRN: [id number]  HPI The patient is here for a wellness exam. The patient has been doing well overall  The patient presents for a follow-up of  chronic hypertension, chronic dyslipidemia, type 2 diabetes controlled with medicines and ESRD on HD. She had her R toes amputated. Doing better.   Review of Systems  Constitutional: Positive for fatigue. Negative for chills.  HENT: Positive for tinnitus. Negative for nosebleeds and dental problem.   Eyes: Negative for redness.  Respiratory: Negative for cough.   Gastrointestinal: Negative for blood in stool and anal bleeding.  Genitourinary: Negative for dysuria, urgency and flank pain.  Musculoskeletal: Positive for myalgias.  Neurological: Positive for weakness and numbness. Negative for dizziness.  Psychiatric/Behavioral: Negative for suicidal ideas. The patient is nervous/anxious.    Wt Readings from Last 3 Encounters:  04/11/11 208 lb (94.348 kg)  01/07/11 212 lb 4 oz (96.276 kg)  12/28/10 220 lb (99.791 kg)   BP Readings from Last 3 Encounters:  04/11/11 160/72  01/07/11 128/74  12/28/10 128/74        Objective:   Physical Exam  Constitutional: She appears well-developed and well-nourished. No distress.  HENT:  Head: Normocephalic.  Right Ear: External ear normal.  Left Ear: External ear normal.  Nose: Nose normal.  Mouth/Throat: Oropharynx is clear and moist.  Eyes: Conjunctivae are normal. Pupils are equal, round, and reactive to light. Right eye exhibits no discharge. Left eye exhibits no discharge.  Neck: Normal range of motion. Neck supple. No JVD present. No tracheal deviation present. No thyromegaly present.  Cardiovascular: Normal rate, regular rhythm and normal heart sounds.   Pulmonary/Chest: No stridor. No respiratory distress. She has no wheezes.    Abdominal: Soft. Bowel sounds are normal. She exhibits no distension and no mass. There is no tenderness. There is no rebound and no guarding.       Colostomy Urostomy  Musculoskeletal: She exhibits no edema and no tenderness.  Lymphadenopathy:    She has no cervical adenopathy.  Neurological: She displays normal reflexes. No cranial nerve deficit. She exhibits normal muscle tone. Coordination normal.  Skin: No rash noted. No erythema.       Dry skin  Psychiatric: She has a normal mood and affect. Her behavior is normal. Judgment and thought content normal.   Cane  Lab Results  Component Value Date   WBC 8.7 10/21/2010   HGB 8.2* 10/21/2010   HCT 27.2* 10/21/2010   PLT 280 10/21/2010   GLUCOSE 159* 10/21/2010   CHOL  Value: 141        ATP III CLASSIFICATION:  <200     mg/dL   Desirable  782-956  mg/dL   Borderline High  >=213    mg/dL   High        0/08/6576   TRIG 189* 01/10/2010   HDL 37* 01/10/2010   LDLCALC  Value: 66        Total Cholesterol/HDL:CHD Risk Coronary Heart Disease Risk Table                     Men   Women  1/2 Average Risk   3.4   3.3  Average Risk       5.0   4.4  2 X Average Risk   9.6  7.1  3 X Average Risk  23.4   11.0        Use the calculated Patient Ratio above and the CHD Risk Table to determine the patient's CHD Risk.        ATP III CLASSIFICATION (LDL):  <100     mg/dL   Optimal  161-096  mg/dL   Near or Above                    Optimal  130-159  mg/dL   Borderline  045-409  mg/dL   High  >811     mg/dL   Very High 09/09/4780   ALT 10 10/28/2010   AST 14 10/28/2010   NA 131* 10/21/2010   K 4.2 10/21/2010   CL 91* 10/21/2010   CREATININE 6.85* 10/21/2010   BUN 27* 10/21/2010   CO2 27 10/21/2010   TSH 0.636 05/16/2010   INR 0.99 05/16/2010   HGBA1C 7.2* 09/29/2010          Assessment & Plan:    A complex case   .

## 2011-04-11 NOTE — Assessment & Plan Note (Signed)
OK BP at HD Continue with current prescription therapy as reflected on the Med list.

## 2011-04-27 ENCOUNTER — Ambulatory Visit (INDEPENDENT_AMBULATORY_CARE_PROVIDER_SITE_OTHER): Payer: Medicare Other | Admitting: Endocrinology

## 2011-04-27 ENCOUNTER — Encounter: Payer: Self-pay | Admitting: Endocrinology

## 2011-04-27 VITALS — BP 132/62 | HR 93 | Temp 98.1°F | Ht 60.0 in | Wt 203.0 lb

## 2011-04-27 DIAGNOSIS — E1129 Type 2 diabetes mellitus with other diabetic kidney complication: Secondary | ICD-10-CM

## 2011-04-27 DIAGNOSIS — E1165 Type 2 diabetes mellitus with hyperglycemia: Secondary | ICD-10-CM

## 2011-04-27 DIAGNOSIS — N058 Unspecified nephritic syndrome with other morphologic changes: Secondary | ICD-10-CM | POA: Diagnosis not present

## 2011-04-27 NOTE — Progress Notes (Signed)
Subjective:    Patient ID: [name], female    DOB: 08-23-1945, 66 y.o.   MRN: [id number]  HPI Pt returns for f/u of IDDM (dx'ed 1984, complicated by renal failure, PAD, CVA, CHF, and peripheral sensory neuropathy).  no cbg record, but states cbg's are in the low-100's.  It is in general higher as the day goes on.  She takes 20 units qam She is still taking the lamisil. Past Medical History  Diagnosis Date  . Other left bundle branch block   . Other and unspecified hyperlipidemia   . Mitral valve insufficiency and aortic valve insufficiency   . Cardiomegaly   . Unspecified essential hypertension   . Type II or unspecified type diabetes mellitus without mention of complication, not stated as uncontrolled   . Personal history of malignant neoplasm of large intestine   . Renal failure     ESRD, Dr Eliott Nine  . CHF (congestive heart failure)   . Stroke   . Anemia   . Cancer     history of colon cancer    Past Surgical History  Procedure Date  . Colostomy   . Revision urostomy cutaneous   . Esophagogastroduodenoscopy 03-18-04  . Electrocardiogram 04-27-06  . Placement of new left forearm arteriovenous graft 03-11-08  . Left heart catheterization and right heart catheterization 12-10    R. heart cath showed elevated left and right heart filling pressures w/ pulmonary artery pressure elevated mildly out of proportion to the wedge. The left heart cath showed diffuse distal vessel disease as well as a 75% stenosis in the mid circumflex w/ a 90% stenosis of the ostial first obtuse marginal. These lesions were in close proximity. there was a 60-70%mild RCA stenosis.   . Arteriovenous graft placement 2010  . 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 Marital Status: Single    Spouse Name: N/A    Number of Children: N/A  . Years of Education: N/A   Occupational History  . Not on file.   Social History Main Topics  .  Smoking status: Never Smoker   . Smokeless tobacco: Never Used  . Alcohol Use: No  . Drug Use: No  . Sexually Active: No   Other Topics Concern  . Not on file   Social History Narrative  . No narrative on file    Current Outpatient Prescriptions on File Prior to Visit  Medication Sig Dispense Refill  . amiodarone (PACERONE) 200 MG tablet Take 1 tablet (200 mg total) by mouth daily.  90 tablet  3  . aspirin 81 MG chewable tablet Chew 81 mg by mouth daily.        . budesonide-formoterol (SYMBICORT) 160-4.5 MCG/ACT inhaler Inhale 2 puffs into the lungs 2 (two) times daily.  1 Inhaler  3  . calcium acetate (PHOSLO) 667 MG capsule Take 2,001 mg by mouth 3 (three) times daily. Take 2 capsules in the middle of each meal each day      . insulin lispro protamine-insulin lispro (HUMALOG 50/50) (50-50) 100 UNIT/ML SUSP Inject 20 Units into the skin daily with breakfast.       . isosorbide mononitrate (IMDUR) 60 MG 24 hr tablet Take 1 tablet (60 mg total) by mouth daily.  30 tablet  6  . metoprolol (LOPRESSOR) 100 MG tablet Take 50 mg by mouth 2 (two) times daily. Non dialysis days only       .  Ostomy Supplies (ACTIVE LIFE CONVEX 1-PC ) POUCH MISC 1 Device by Does not apply route every 3 (three) days.  30 each  5  . Ostomy Supplies (NATURA DURAHESIVE MOLDABLE) WAFR 1 Wafer by Does not apply route 3 (three) times a week.  30 Wafer  5  . Ostomy Supplies (SUR-FIT NATURA UROSTOMY POUCH) POUCH MISC 1 Device by Does not apply route every 3 (three) days.  30 each  5  . simvastatin (ZOCOR) 10 MG tablet Take 10 mg by mouth at bedtime.        . terbinafine (LAMISIL) 250 MG tablet 1 tab 3x a week  30 tablet  0  . promethazine-codeine (PHENERGAN WITH CODEINE) 6.25-10 MG/5ML syrup Take 5 mLs by mouth every 4 (four) hours as needed.  240 mL  1    Allergies  Allergen Reactions  . Ace Inhibitors   . Enalapril   . Lisinopril     REACTION: unspecified  . Omnipaque (Iohexol)     Family History  Problem  Relation Age of Onset  . Cancer Sister     colon  . Hypertension Other   . Hypertension Mother   . Coronary artery disease Mother   . Hypertension Father   . Diabetes Father     BP 132/62  Pulse 93  Temp(Src) 98.1 F (36.7 C) (Oral)  Ht 5' (1.524 m)  Wt 203 lb (92.08 kg)  BMI 39.65 kg/m2  SpO2 93%    Review of Systems denies hypoglycemia    Objective:   Physical Exam VITAL SIGNS:  See vs page.  GENERAL: no distress. Pulses: dorsalis pedis absent bilat.   Feet: no ulcer on the feet.  feet are of normal color and temp.  no edema.  There is a healed surgical scar (transmetatarsal amputation) of the right foot.  There is severe onychomycosis of the left toenails. Neuro: sensation is intact to touch on the feet, but decreased from normal.      Assessment & Plan:  Onychomycosis, improved but not resolved DM, ? control

## 2011-04-27 NOTE — Patient Instructions (Addendum)
blood tests are being requested for you today.  You will receive a letter with results. Continue humalog 50/50, 20 units each morning.  This is less than what you were on before you got sick, because i believe you have lost weight, and therefore need less now.   Please come back for a follow-up appointment in 3 months. check your blood sugar 2 times a day.  vary the time of day when you check, between before the 3 meals, and at bedtime.  also check if you have symptoms of your blood sugar being too high or too low.  please keep a record of the readings and bring it to your next appointment here.  please call us sooner if you are having low blood sugar episodes, or if it stays over 200.  Please finish the lamisil.  (see letter)

## 2011-05-02 ENCOUNTER — Ambulatory Visit (INDEPENDENT_AMBULATORY_CARE_PROVIDER_SITE_OTHER): Payer: Medicare Other | Admitting: Internal Medicine

## 2011-05-02 ENCOUNTER — Encounter: Payer: Self-pay | Admitting: Internal Medicine

## 2011-05-02 VITALS — BP 186/83 | HR 87 | Ht 60.0 in | Wt 208.4 lb

## 2011-05-02 DIAGNOSIS — R55 Syncope and collapse: Secondary | ICD-10-CM | POA: Diagnosis not present

## 2011-05-02 DIAGNOSIS — I2510 Atherosclerotic heart disease of native coronary artery without angina pectoris: Secondary | ICD-10-CM

## 2011-05-02 DIAGNOSIS — I471 Supraventricular tachycardia: Secondary | ICD-10-CM

## 2011-05-02 DIAGNOSIS — I498 Other specified cardiac arrhythmias: Secondary | ICD-10-CM

## 2011-05-02 MED ORDER — SIMVASTATIN 10 MG PO TABS: 10.0000 mg | ORAL_TABLET | Freq: Every day | ORAL | Status: DC

## 2011-05-02 NOTE — Assessment & Plan Note (Signed)
Appears largely symptomatically quiet. We will decrease her amiodarone from 1400-1000 mg a week. We'll arrange for surveillance laboratories as it is been more than 6 months. At that time LFTs and TSH were normal

## 2011-05-02 NOTE — Assessment & Plan Note (Signed)
No recurrent syncope 

## 2011-05-02 NOTE — Patient Instructions (Signed)
Your physician has recommended you make the following change in your medication:  1) Decrease amiodarone to 200 mg one tablet by mouth 5 days a week. 2) Please clarify which metoprolol you are taking (tartrate- twice daily vs succinate- once daily).  Please take the lab order you have been given today to your next dialysis appointment. They can fax the results to Dr. Graciela Husbands.  Your physician wants you to follow-up in: 6 months with Dr. Graciela Husbands. You will receive a reminder letter in the mail two months in advance. If you don't receive a letter, please call our office to schedule the follow-up appointment.

## 2011-05-02 NOTE — Progress Notes (Signed)
HPI  [name] is a 66 y.o. female seen in follow up for an atrial tachycardia that was responsive to amiodarone.  Remotely she had an echo that demonstratedsevere left ventricular dysfunction at about 25%. she developed recurrent tachycardia i  in December 2010  was found to have a non-STEMI. She underwent catheterization that demonstrated 60% mid RCA subtotal occlusion of the posterior descending 70 AV circ disease and a 30% proximal LAD. She also had distal disease in her diagonal and her LAD  ejection fraction however had improved to 45% and subsequent echoes over the last year have shown ejection fraction 55-65% on all occasions.   she has continued to tolerate her amiodarone without symptoms of nausea balance problems. She denies  peripheral edema and chest pain. She does have some "puckered breathing". She does not know whether this typically tracks with her third day.  She has hypotension associated with dialysis and takes her metoprolol only on nondialysis days.   She has end-stage renal disease and is on dialysis.  she has chronic left bundle branch block   Past Medical History  Diagnosis Date  . LBBB (left bundle branch block)   . Other and unspecified hyperlipidemia   . Mitral valve insufficiency and aortic valve insufficiency   . Cardiomyopathy- mixed   . Unspecified essential hypertension   . Type II or unspecified type diabetes mellitus without mention of complication, not stated as uncontrolled   . Personal history of malignant neoplasm of large intestine   . Renal failure     ESRD, Dr Eliott Nine  . CHF (congestive heart failure)   . Stroke   . Anemia   . Cancer     history of colon cancer  . Coronary artery disease     severely decreased EF.   [name] Atrial tachycardia     on amiod    Past Surgical History  Procedure Date  . Colostomy   . Revision urostomy cutaneous   . Esophagogastroduodenoscopy 03-18-04  . Electrocardiogram 04-27-06  . Placement of new  left forearm arteriovenous graft 03-11-08  . Left heart catheterization and right heart catheterization 12-10    R. heart cath showed elevated left and right heart filling pressures w/ pulmonary artery pressure elevated mildly out of proportion to the wedge. The left heart cath showed diffuse distal vessel disease as well as a 75% stenosis in the mid circumflex w/ a 90% stenosis of the ostial first obtuse marginal. These lesions were in close proximity. there was a 60-70%mild RCA stenosis.   . Arteriovenous graft placement 2010  . 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 amiodarone (PACERONE) 200 MG tablet Take 1 tablet (200 mg total) by mouth daily.  90 tablet  3  . aspirin 81 MG chewable tablet Chew 81 mg by mouth daily.        . budesonide-formoterol (SYMBICORT) 160-4.5 MCG/ACT inhaler Inhale 2 puffs into the lungs 2 (two) times daily.  1 Inhaler  3  . calcium acetate (PHOSLO) 667 MG capsule Take 2,001 mg by mouth 3 (three) times daily. Take 2 capsules in the middle of each meal each day      . insulin lispro protamine-insulin lispro (HUMALOG 50/50) (50-50) 100 UNIT/ML SUSP Inject 20 Units into the skin daily with breakfast.       . isosorbide mononitrate (IMDUR) 60 MG 24 hr tablet Take 1 tablet (60 mg total)  by mouth daily.  30 tablet  6  . metoprolol (LOPRESSOR) 100 MG tablet Take 50 mg by mouth 2 (two) times daily. Non dialysis days only       . Ostomy Supplies (ACTIVE LIFE CONVEX 1-PC ) POUCH MISC 1 Device by Does not apply route every 3 (three) days.  30 each  5  . Ostomy Supplies (NATURA DURAHESIVE MOLDABLE) WAFR 1 Wafer by Does not apply route 3 (three) times a week.  30 Wafer  5  . Ostomy Supplies (SUR-FIT NATURA UROSTOMY POUCH) POUCH MISC 1 Device by Does not apply route every 3 (three) days.  30 each  5  . promethazine-codeine (PHENERGAN WITH CODEINE) 6.25-10 MG/5ML syrup Take 5  mLs by mouth every 4 (four) hours as needed.  240 mL  1  . simvastatin (ZOCOR) 10 MG tablet Take 10 mg by mouth at bedtime.        . terbinafine (LAMISIL) 250 MG tablet 1 tab 3x a week  30 tablet  0    Allergies  Allergen Reactions  . Ace Inhibitors   . Enalapril   . Lisinopril     REACTION: unspecified  . Omnipaque (Iohexol)     Review of Systems negative except from HPI and PMH  Physical Exam BP 186/83  Pulse 87  Ht 5' (1.524 m)  Wt 208 lb 6.4 oz (94.53 kg)  BMI 40.70 kg/m2 Well developed and well nourished in no acute distress HENT normal E scleral and icterus clear Neck Supple JVP 8-9; carotids brisk and full Clear to ausculation Regular rate and rhythm, she has a to-and-fro murmur over the left subclavicular area and a 3/6 systolic murmur heard left lower sternal border rating out to the apex  Abdomen is soft with active bowel sounds nds No clubbing cyanosis none Edema Alert and oriented, grossly normal motor and sensory function Skin Warm and Dry  ECG demonstrates sinus rhythm at 88 Intervals to 02/24/43 Axis -55 Left bundle branch block Assessment and  Plan

## 2011-05-02 NOTE — Assessment & Plan Note (Signed)
Without significant exertional symptoms to suggest an anginal equivalent. We'll continue her on her current medications

## 2011-05-03 ENCOUNTER — Encounter: Payer: Self-pay | Admitting: Internal Medicine

## 2011-05-09 DIAGNOSIS — E11319 Type 2 diabetes mellitus with unspecified diabetic retinopathy without macular edema: Secondary | ICD-10-CM | POA: Diagnosis not present

## 2011-05-09 DIAGNOSIS — E119 Type 2 diabetes mellitus without complications: Secondary | ICD-10-CM | POA: Diagnosis not present

## 2011-05-10 ENCOUNTER — Encounter: Payer: Self-pay | Admitting: Internal Medicine

## 2011-05-15 DIAGNOSIS — E11359 Type 2 diabetes mellitus with proliferative diabetic retinopathy without macular edema: Secondary | ICD-10-CM | POA: Diagnosis not present

## 2011-05-15 DIAGNOSIS — H4310 Vitreous hemorrhage, unspecified eye: Secondary | ICD-10-CM | POA: Diagnosis not present

## 2011-05-20 ENCOUNTER — Emergency Department (HOSPITAL_COMMUNITY): Payer: BC Managed Care – PPO

## 2011-05-20 ENCOUNTER — Encounter (HOSPITAL_COMMUNITY): Payer: Self-pay | Admitting: Emergency Medicine

## 2011-05-20 ENCOUNTER — Inpatient Hospital Stay (HOSPITAL_COMMUNITY)
Admission: EM | Admit: 2011-05-20 | Discharge: 2011-05-27 | DRG: 423 | Disposition: A | Discharge status: Home Health | Payer: BC Managed Care – PPO | Source: Ambulatory Visit | Attending: Internal Medicine | Admitting: Internal Medicine

## 2011-05-20 DIAGNOSIS — A419 Sepsis, unspecified organism: Secondary | ICD-10-CM

## 2011-05-20 DIAGNOSIS — S98919A Complete traumatic amputation of unspecified foot, level unspecified, initial encounter: Secondary | ICD-10-CM

## 2011-05-20 DIAGNOSIS — N186 End stage renal disease: Secondary | ICD-10-CM | POA: Diagnosis present

## 2011-05-20 DIAGNOSIS — I509 Heart failure, unspecified: Secondary | ICD-10-CM | POA: Diagnosis present

## 2011-05-20 DIAGNOSIS — R079 Chest pain, unspecified: Secondary | ICD-10-CM | POA: Diagnosis not present

## 2011-05-20 DIAGNOSIS — A4902 Methicillin resistant Staphylococcus aureus infection, unspecified site: Secondary | ICD-10-CM | POA: Diagnosis present

## 2011-05-20 DIAGNOSIS — R7989 Other specified abnormal findings of blood chemistry: Secondary | ICD-10-CM | POA: Diagnosis present

## 2011-05-20 DIAGNOSIS — G9340 Encephalopathy, unspecified: Secondary | ICD-10-CM | POA: Diagnosis present

## 2011-05-20 DIAGNOSIS — D72829 Elevated white blood cell count, unspecified: Secondary | ICD-10-CM | POA: Diagnosis present

## 2011-05-20 DIAGNOSIS — Z932 Ileostomy status: Secondary | ICD-10-CM

## 2011-05-20 DIAGNOSIS — I428 Other cardiomyopathies: Secondary | ICD-10-CM | POA: Diagnosis present

## 2011-05-20 DIAGNOSIS — R1110 Vomiting, unspecified: Secondary | ICD-10-CM | POA: Diagnosis not present

## 2011-05-20 DIAGNOSIS — N390 Urinary tract infection, site not specified: Secondary | ICD-10-CM | POA: Diagnosis present

## 2011-05-20 DIAGNOSIS — Z933 Colostomy status: Secondary | ICD-10-CM

## 2011-05-20 DIAGNOSIS — D649 Anemia, unspecified: Secondary | ICD-10-CM | POA: Diagnosis present

## 2011-05-20 DIAGNOSIS — R7301 Impaired fasting glucose: Secondary | ICD-10-CM | POA: Diagnosis not present

## 2011-05-20 DIAGNOSIS — I6789 Other cerebrovascular disease: Secondary | ICD-10-CM | POA: Diagnosis not present

## 2011-05-20 DIAGNOSIS — Z7982 Long term (current) use of aspirin: Secondary | ICD-10-CM

## 2011-05-20 DIAGNOSIS — I498 Other specified cardiac arrhythmias: Secondary | ICD-10-CM | POA: Diagnosis present

## 2011-05-20 DIAGNOSIS — IMO0002 Reserved for concepts with insufficient information to code with codable children: Secondary | ICD-10-CM | POA: Diagnosis present

## 2011-05-20 DIAGNOSIS — I447 Left bundle-branch block, unspecified: Secondary | ICD-10-CM | POA: Diagnosis present

## 2011-05-20 DIAGNOSIS — R7881 Bacteremia: Principal | ICD-10-CM | POA: Diagnosis present

## 2011-05-20 DIAGNOSIS — I120 Hypertensive chronic kidney disease with stage 5 chronic kidney disease or end stage renal disease: Secondary | ICD-10-CM | POA: Diagnosis present

## 2011-05-20 DIAGNOSIS — I517 Cardiomegaly: Secondary | ICD-10-CM | POA: Diagnosis not present

## 2011-05-20 DIAGNOSIS — Z794 Long term (current) use of insulin: Secondary | ICD-10-CM

## 2011-05-20 DIAGNOSIS — I2510 Atherosclerotic heart disease of native coronary artery without angina pectoris: Secondary | ICD-10-CM | POA: Diagnosis present

## 2011-05-20 DIAGNOSIS — I080 Rheumatic disorders of both mitral and aortic valves: Secondary | ICD-10-CM | POA: Diagnosis present

## 2011-05-20 DIAGNOSIS — Z79899 Other long term (current) drug therapy: Secondary | ICD-10-CM

## 2011-05-20 DIAGNOSIS — I471 Supraventricular tachycardia: Secondary | ICD-10-CM

## 2011-05-20 DIAGNOSIS — N2581 Secondary hyperparathyroidism of renal origin: Secondary | ICD-10-CM | POA: Diagnosis present

## 2011-05-20 DIAGNOSIS — R112 Nausea with vomiting, unspecified: Secondary | ICD-10-CM | POA: Diagnosis not present

## 2011-05-20 DIAGNOSIS — Z8673 Personal history of transient ischemic attack (TIA), and cerebral infarction without residual deficits: Secondary | ICD-10-CM

## 2011-05-20 DIAGNOSIS — Z85038 Personal history of other malignant neoplasm of large intestine: Secondary | ICD-10-CM

## 2011-05-20 DIAGNOSIS — R4182 Altered mental status, unspecified: Secondary | ICD-10-CM | POA: Diagnosis not present

## 2011-05-20 DIAGNOSIS — R5383 Other fatigue: Secondary | ICD-10-CM | POA: Diagnosis not present

## 2011-05-20 DIAGNOSIS — E1165 Type 2 diabetes mellitus with hyperglycemia: Secondary | ICD-10-CM | POA: Diagnosis not present

## 2011-05-20 DIAGNOSIS — R110 Nausea: Secondary | ICD-10-CM | POA: Diagnosis present

## 2011-05-20 DIAGNOSIS — G9341 Metabolic encephalopathy: Secondary | ICD-10-CM | POA: Diagnosis present

## 2011-05-20 DIAGNOSIS — Z992 Dependence on renal dialysis: Secondary | ICD-10-CM

## 2011-05-20 DIAGNOSIS — E785 Hyperlipidemia, unspecified: Secondary | ICD-10-CM | POA: Diagnosis present

## 2011-05-20 DIAGNOSIS — E1129 Type 2 diabetes mellitus with other diabetic kidney complication: Secondary | ICD-10-CM | POA: Diagnosis present

## 2011-05-20 LAB — RAPID URINE DRUG SCREEN, HOSP PERFORMED
Amphetamines: NOT DETECTED
Benzodiazepines: NOT DETECTED
Tetrahydrocannabinol: NOT DETECTED

## 2011-05-20 LAB — COMPREHENSIVE METABOLIC PANEL
Albumin: 2.7 g/dL — ABNORMAL LOW (ref 3.5–5.2)
BUN: 20 mg/dL (ref 6–23)
Calcium: 8.8 mg/dL (ref 8.4–10.5)
GFR calc Af Amer: 11 mL/min — ABNORMAL LOW (ref >90)
Glucose, Bld: 320 mg/dL — ABNORMAL HIGH (ref 70–99)
Total Protein: 7.8 g/dL (ref 6.0–8.3)

## 2011-05-20 LAB — DIFFERENTIAL
Basophils Relative: 0 % (ref 0–1)
Eosinophils Absolute: 0 10*3/uL (ref 0.0–0.7)
Eosinophils Relative: 0 % (ref 0–5)
Lymphs Abs: 0.7 10*3/uL (ref 0.7–4.0)
Monocytes Relative: 6 % (ref 3–12)

## 2011-05-20 LAB — CBC
Hemoglobin: 7.9 g/dL — ABNORMAL LOW (ref 12.0–15.0)
MCH: 23.4 pg — ABNORMAL LOW (ref 26.0–34.0)
MCHC: 28.5 g/dL — ABNORMAL LOW (ref 30.0–36.0)
MCV: 82.2 fL (ref 78.0–100.0)
Platelets: 401 10*3/uL — ABNORMAL HIGH (ref 150–400)
RBC: 3.37 MIL/uL — ABNORMAL LOW (ref 3.87–5.11)

## 2011-05-20 LAB — URINALYSIS, ROUTINE W REFLEX MICROSCOPIC
Ketones, ur: 15 mg/dL — AB
Nitrite: POSITIVE — AB
Specific Gravity, Urine: 1.015 (ref 1.005–1.030)
pH: 8 (ref 5.0–8.0)

## 2011-05-20 LAB — URINE MICROSCOPIC-ADD ON

## 2011-05-20 LAB — ETHANOL: Alcohol, Ethyl (B): <11 mg/dL (ref 0–11)

## 2011-05-20 LAB — POCT I-STAT TROPONIN I

## 2011-05-20 LAB — ACETAMINOPHEN LEVEL: Acetaminophen (Tylenol), Serum: <15 ug/mL (ref 10–30)

## 2011-05-20 MED ORDER — DEXTROSE 5 % IV SOLN
1.0000 g | Freq: Once | INTRAVENOUS | Status: AC
  Administered 2011-05-21: 1 g via INTRAVENOUS
  Filled 2011-05-20: qty 10

## 2011-05-20 MED ORDER — SODIUM CHLORIDE 0.9 % IV SOLN
INTRAVENOUS | Status: DC
  Administered 2011-05-20 – 2011-05-21 (×2): via INTRAVENOUS

## 2011-05-20 NOTE — ED Notes (Signed)
Archie Patten (daughter) [phone] To call for any updates

## 2011-05-20 NOTE — ED Notes (Signed)
Dr. Ignacia Palma notified of troponin level 0.14

## 2011-05-20 NOTE — ED Notes (Signed)
IV Team and Lab are in room with pt

## 2011-05-20 NOTE — ED Notes (Signed)
Daughter advises that her mother has lost a lot of weight and that she eats very little and that she has had nausea and vomiting at least every other day

## 2011-05-20 NOTE — ED Notes (Signed)
Patient has a clostomy and a urostomy

## 2011-05-20 NOTE — ED Notes (Signed)
Patient transported to CT 

## 2011-05-20 NOTE — ED Provider Notes (Signed)
History     CSN: [id number]  Arrival date & time 05/20/11  1830   First MD Initiated Contact with Patient 05/20/11 1921      Chief Complaint  Patient presents with  . Fatigue  . Altered Mental Status    (Consider location/radiation/quality/duration/timing/severity/associated sxs/prior treatment) HPI Comments: The patient is a 66 year old woman whose history is given by her daughter. The daughter says the patient feels weak. She's been vomiting her food intermittently for about a month. Today she hasn't been talking right. She has had blood in her urine today also. She is a patient who has end-stage renal disease, takes dialysis Monday Wednesday and Friday. She has hypertension and diabetes. She had colon cancer in the past, and has a colostomy and a urostomy. More recently she's had toe amputations for poor circulation.  Patient is a 66 y.o. female presenting with altered mental status. The history is provided by the patient. No language interpreter was used.  Altered Mental Status This is a new problem. The current episode started 6 to 12 hours ago (Patient's daughter notes that the patient has not been talking right, her speech is slow. She has some confusion on answering questions.). The problem occurs constantly. The problem has not changed since onset.Pertinent negatives include no chest pain, no abdominal pain, no headaches and no shortness of breath. Associated symptoms comments: Vomiting, blood in the urine.. The symptoms are aggravated by nothing. The symptoms are relieved by nothing. She has tried nothing for the symptoms.    Past Medical History  Diagnosis Date  . LBBB (left bundle branch block)   . Other and unspecified hyperlipidemia   . Mitral valve insufficiency and aortic valve insufficiency   . Cardiomyopathy- mixed   . Unspecified essential hypertension   . Type II or unspecified type diabetes mellitus without mention of complication, not stated as uncontrolled   .  Personal history of malignant neoplasm of large intestine   . Renal failure     ESRD, Dr Eliott Nine  . CHF (congestive heart failure)   . Stroke   . Anemia   . Cancer     history of colon cancer  . Coronary artery disease     severely decreased EF.   [name] Atrial tachycardia     on amiod    Past Surgical History  Procedure Date  . Colostomy   . Revision urostomy cutaneous   . Esophagogastroduodenoscopy 03-18-04  . Electrocardiogram 04-27-06  . Placement of new left forearm arteriovenous graft 03-11-08  . Left heart catheterization and right heart catheterization 12-10    R. heart cath showed elevated left and right heart filling pressures w/ pulmonary artery pressure elevated mildly out of proportion to the wedge. The left heart cath showed diffuse distal vessel disease as well as a 75% stenosis in the mid circumflex w/ a 90% stenosis of the ostial first obtuse marginal. These lesions were in close proximity. there was a 60-70%mild RCA stenosis.   . Arteriovenous graft placement 2010  . 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 Cancer Sister     colon  . Hypertension Other   . Hypertension Mother   . Coronary artery disease Mother   . Hypertension Father   . Diabetes Father     History  Substance Use Topics  . Smoking status: Never Smoker   . Smokeless tobacco: Never Used  . Alcohol Use: No  OB History    Grav Para Term Preterm Abortions TAB SAB Ect Mult Living                  Review of Systems  Constitutional: Positive for fatigue. Negative for fever and chills.  HENT: Negative.   Eyes: Negative.   Respiratory: Negative.  Negative for shortness of breath.   Cardiovascular: Negative for chest pain.  Gastrointestinal: Positive for nausea and vomiting. Negative for abdominal pain and diarrhea.  Genitourinary: Positive for hematuria.  Musculoskeletal: Negative.   Skin: Negative.     Neurological: Positive for speech difficulty and weakness. Negative for headaches.  Psychiatric/Behavioral: Positive for confusion and altered mental status.    Allergies  Ace inhibitors; Enalapril; Lisinopril; and Omnipaque  Home Medications   Current Outpatient Rx  Name Route Sig Dispense Refill  . AMIODARONE HCL 200 MG PO TABS  Tuesday, Thursday, Saturday, Sunday    . ASPIRIN 81 MG PO CHEW Oral Chew 81 mg by mouth daily.      [name] CALCIUM ACETATE 667 MG PO CAPS Oral Take 2,001 mg by mouth 3 (three) times daily. Take 2 capsules in the middle of each meal each day    . INSULIN LISPRO PROT & LISPRO (50-50) 100 UNIT/ML [place] SUSP Subcutaneous Inject 20 Units into the skin daily with breakfast.     . ISOSORBIDE MONONITRATE ER 60 MG PO TB24 Oral Take 60 mg by mouth daily.    [name] METOPROLOL TARTRATE 100 MG PO TABS Oral Take 50 mg by mouth 2 (two) times daily. Non dialysis days only     . SIMVASTATIN 10 MG PO TABS Oral Take 10 mg by mouth daily.    . TERBINAFINE HCL 250 MG PO TABS Oral Take 250 mg by mouth daily. Non-dialysis days only    . ACTIVE LIFE CONVEX 1-PC POUCH MISC Does not apply 1 Device by Does not apply route every 3 (three) days. 30 each 5  . NATURA DURAHESIVE MOLDABLE WAFR Does not apply 1 Wafer by Does not apply route 3 (three) times a week. 30 Wafer 5  . SUR-FIT NATURA UROSTOMY POUCH MISC Does not apply 1 Device by Does not apply route every 3 (three) days. 30 each 5    BP 157/67  Pulse 96  Temp(Src) 99.9 F (37.7 C) (Oral)  Resp 23  SpO2 100%  Physical Exam  Nursing note and vitals reviewed. Constitutional: She appears well-developed and well-nourished. No distress.  HENT:  Head: Normocephalic and atraumatic.  Right Ear: External ear normal.  Left Ear: External ear normal.  Mouth/Throat: Oropharynx is clear and moist.  Eyes: Conjunctivae and EOM are normal. Pupils are equal, round, and reactive to light.  Neck: Normal range of motion. Neck supple. No JVD present.        No Carotid bruits.  Cardiovascular: Normal rate, regular rhythm and normal heart sounds.   Pulmonary/Chest: Effort normal and breath sounds normal.  Abdominal: Soft. Bowel sounds are normal. She exhibits no distension. There is no tenderness.       She has a right midabdominal urostomy, located a bit below her umbilicus, and a left lower lobe colostomy. The urostomy bag has bloody urine.  Musculoskeletal: Normal range of motion. She exhibits no edema and no tenderness.  Neurological: She is alert.       Cranial nerves intact. Sensory and motor intact. Speech is slow, but there is no slurring. She is sometimes confused by the questioning.  Skin: Skin is warm and  dry.  Psychiatric: She has a normal mood and affect. Her behavior is normal.    ED Course  Procedures (including critical care time)   Labs Reviewed  CBC  DIFFERENTIAL  COMPREHENSIVE METABOLIC PANEL  URINALYSIS, ROUTINE W REFLEX MICROSCOPIC  URINE CULTURE  ETHANOL  URINE RAPID DRUG SCREEN (HOSP PERFORMED)  ACETAMINOPHEN LEVEL  SALICYLATE LEVEL  LACTIC ACID, PLASMA  PROCALCITONIN  CULTURE, BLOOD (ROUTINE X 2)  CULTURE, BLOOD (ROUTINE X 2)   8:28 PM Pt was seen and had physical examination.  Lab workup was ordered.  Old charts were reviewed.  8:55 PM  Date: 05/20/2011  Rate: 99  Rhythm: normal sinus rhythm  QRS Axis: left  Intervals: normal  ST/T Wave abnormalities: normal  Conduction Disutrbances:left bundle branch block  Narrative Interpretation: Abnormal EKG.  Old EKG Reviewed: unchanged  10:48 PM Results for orders placed during the hospital encounter of 05/20/11  CBC      Component Value Range   WBC 19.3 (*) 4.0 - 10.5 (K/uL)   RBC 3.37 (*) 3.87 - 5.11 (MIL/uL)   Hemoglobin 7.9 (*) 12.0 - 15.0 (g/dL)   HCT 16.1 (*) 09.6 - 46.0 (%)   MCV 82.2  78.0 - 100.0 (fL)   MCH 23.4 (*) 26.0 - 34.0 (pg)   MCHC 28.5 (*) 30.0 - 36.0 (g/dL)   RDW 04.5 (*) 40.9 - 15.5 (%)   Platelets 401 (*) 150 - 400 (K/uL)   DIFFERENTIAL      Component Value Range   Neutrophils Relative 90 (*) 43 - 77 (%)   Neutro Abs 17.4 (*) 1.7 - 7.7 (K/uL)   Lymphocytes Relative 4 (*) 12 - 46 (%)   Lymphs Abs 0.7  0.7 - 4.0 (K/uL)   Monocytes Relative 6  3 - 12 (%)   Monocytes Absolute 1.1 (*) 0.1 - 1.0 (K/uL)   Eosinophils Relative 0  0 - 5 (%)   Eosinophils Absolute 0.0  0.0 - 0.7 (K/uL)   Basophils Relative 0  0 - 1 (%)   Basophils Absolute 0.0  0.0 - 0.1 (K/uL)  COMPREHENSIVE METABOLIC PANEL      Component Value Range   Sodium 136  135 - 145 (mEq/L)   Potassium 3.0 (*) 3.5 - 5.1 (mEq/L)   Chloride 92 (*) 96 - 112 (mEq/L)   CO2 30  19 - 32 (mEq/L)   Glucose, Bld 320 (*) 70 - 99 (mg/dL)   BUN 20  6 - 23 (mg/dL)   Creatinine, Ser 8.11 (*) 0.50 - 1.10 (mg/dL)   Calcium 8.8  8.4 - 91.4 (mg/dL)   Total Protein 7.8  6.0 - 8.3 (g/dL)   Albumin 2.7 (*) 3.5 - 5.2 (g/dL)   AST 10  0 - 37 (U/L)   ALT 6  0 - 35 (U/L)   Alkaline Phosphatase 140 (*) 39 - 117 (U/L)   Total Bilirubin 0.5  0.3 - 1.2 (mg/dL)   GFR calc non Af Amer 10 (*) >90 (mL/min)   GFR calc Af Amer 11 (*) >90 (mL/min)  URINALYSIS, ROUTINE W REFLEX MICROSCOPIC      Component Value Range   Color, Urine RED (*) YELLOW    APPearance TURBID (*) CLEAR    Specific Gravity, Urine 1.015  1.005 - 1.030    pH 8.0  5.0 - 8.0    Glucose, UA 250 (*) NEGATIVE (mg/dL)   Hgb urine dipstick LARGE (*) NEGATIVE    Bilirubin Urine LARGE (*) NEGATIVE    Ketones, ur 15 (*)  NEGATIVE (mg/dL)   Protein, ur >409 (*) NEGATIVE (mg/dL)   Urobilinogen, UA 4.0 (*) 0.0 - 1.0 (mg/dL)   Nitrite POSITIVE (*) NEGATIVE    Leukocytes, UA LARGE (*) NEGATIVE   ETHANOL      Component Value Range   Alcohol, Ethyl (B) <11  0 - 11 (mg/dL)  ACETAMINOPHEN LEVEL      Component Value Range   Acetaminophen (Tylenol), Serum <15.0  10 - 30 (ug/mL)  SALICYLATE LEVEL      Component Value Range   Salicylate Lvl <2.0 (*) 2.8 - 20.0 (mg/dL)  LACTIC ACID, PLASMA      Component Value Range    Lactic Acid, Venous 1.8  0.5 - 2.2 (mmol/L)  PROCALCITONIN      Component Value Range   Procalcitonin 8.13    URINE MICROSCOPIC-ADD ON      Component Value Range   Squamous Epithelial / LPF RARE  RARE    WBC, UA TOO NUMEROUS TO COUNT  <3 (WBC/hpf)   RBC / HPF TOO NUMEROUS TO COUNT  <3 (RBC/hpf)   Bacteria, UA MANY (*) RARE    Urine-Other LESS THAN 10 mL OF URINE SUBMITTED     Dg Chest 2 View  05/20/2011  *RADIOLOGY REPORT*  Clinical Data: Vomiting and altered mental status.  CHEST - 2 VIEW  Comparison: 10/05/2010  Findings: Shallow inspiration.  Cardiac enlargement with normal pulmonary vascularity for technique.  No focal airspace consolidation in the lungs.  No blunting of costophrenic angles. No pneumothorax.  Degenerative changes in the spine.  No significant change since previous study.  Motion artifact.  IMPRESSION: Cardiac enlargement without vascular congestion or edema.  Original Report Authenticated By: Marlon Pel, M.D.   Ct Head Wo Contrast  05/20/2011  *RADIOLOGY REPORT*  Clinical Data: Altered mental status and vomiting after eating. Diabetes.  End-stage renal disease.  CT HEAD WITHOUT CONTRAST  Technique:  Contiguous axial images were obtained from the base of the skull through the vertex without contrast.  Comparison: MRI 01/09/2010.  CT 01/08/2010  Findings: Technically limited study due to motion artifact.  There is diffuse cerebral atrophy.  Low attenuation changes throughout the deep and periventricular white matter consistent with small vessel ischemia.  Previously identified left thalamic infarct is less well visualized today.  Suggestion of some low attenuation change in the pons which might represent small vessel ischemia. Similar changes were present on the previous MRI.  No mass effect or midline shift.  No abnormal extra-axial fluid collections.  Wallace Cullens- white matter junctions are distinct.  Basal cisterns are not effaced.  No evidence of acute intracranial hemorrhage.   No depressed skull fractures.  Visualized paranasal sinuses demonstrate retention cyst in the left maxillary antrum.  Mastoid air cells are not opacified.  Vascular calcifications.  IMPRESSION: Chronic atrophy and small vessel ischemic changes.  No acute intracranial abnormalities.  Original Report Authenticated By: Marlon Pel, M.D.    Chest film and head CT were negative.  CBC showed elevated WBC of 19,300.  Lactic acid was negative, but procalcitonin was 8, suggesting infection.  Urine has been sent.  Pt had episode of vomiting.  RN did EKG that was unchanged from prior.  TNI ordered.  Waiting for urine to come back and then will call for her admission.   Date: 05/20/2011  Rate: 103 Rhythm: Sinus tachycardia, muscle tremor artifact.  QRS Axis: left  Intervals: normal  ST/T Wave abnormalities: normal  Conduction Disutrbances:left bundle branch block  Narrative Interpretation:  Abnormal EKG.  Old EKG Reviewed: unchanged  UA showed WBC's TNTc.  TNI 0.14.  Call to [facility] to admit her for uroosepsis.   12:18 AM Case discussed with Dr. Adela Glimpse, admit to [facility] to a telemetry bed.  Dr. Adela Glimpse requests cardiology consult regarding her troponin I.      1. Urosepsis   2. End stage renal failure on dialysis   3. Elevated troponin I level          Carleene Cooper III, MD 05/21/11 0021

## 2011-05-20 NOTE — ED Notes (Signed)
Patients family advise altered mental status and generalized feeling bad for two days. Patient is very confused Denies pain, no nausea, vomiting or diarrhea, dialysis last on Friday. ( She take dialysis on Monday, Wednesday and Friday.)

## 2011-05-20 NOTE — ED Notes (Signed)
IV Team was paged twice.  Pt is next to have IV placed.

## 2011-05-21 ENCOUNTER — Inpatient Hospital Stay (HOSPITAL_COMMUNITY): Payer: BC Managed Care – PPO

## 2011-05-21 DIAGNOSIS — I447 Left bundle-branch block, unspecified: Secondary | ICD-10-CM | POA: Diagnosis present

## 2011-05-21 DIAGNOSIS — R110 Nausea: Secondary | ICD-10-CM | POA: Diagnosis present

## 2011-05-21 DIAGNOSIS — S98919A Complete traumatic amputation of unspecified foot, level unspecified, initial encounter: Secondary | ICD-10-CM | POA: Diagnosis not present

## 2011-05-21 DIAGNOSIS — E782 Mixed hyperlipidemia: Secondary | ICD-10-CM

## 2011-05-21 DIAGNOSIS — R7881 Bacteremia: Secondary | ICD-10-CM | POA: Diagnosis not present

## 2011-05-21 DIAGNOSIS — Z992 Dependence on renal dialysis: Secondary | ICD-10-CM | POA: Diagnosis not present

## 2011-05-21 DIAGNOSIS — Z85038 Personal history of other malignant neoplasm of large intestine: Secondary | ICD-10-CM | POA: Diagnosis not present

## 2011-05-21 DIAGNOSIS — N390 Urinary tract infection, site not specified: Secondary | ICD-10-CM | POA: Diagnosis present

## 2011-05-21 DIAGNOSIS — Z794 Long term (current) use of insulin: Secondary | ICD-10-CM | POA: Diagnosis not present

## 2011-05-21 DIAGNOSIS — D649 Anemia, unspecified: Secondary | ICD-10-CM | POA: Diagnosis present

## 2011-05-21 DIAGNOSIS — Z933 Colostomy status: Secondary | ICD-10-CM | POA: Diagnosis not present

## 2011-05-21 DIAGNOSIS — I517 Cardiomegaly: Secondary | ICD-10-CM | POA: Diagnosis not present

## 2011-05-21 DIAGNOSIS — D739 Disease of spleen, unspecified: Secondary | ICD-10-CM | POA: Diagnosis not present

## 2011-05-21 DIAGNOSIS — I080 Rheumatic disorders of both mitral and aortic valves: Secondary | ICD-10-CM | POA: Diagnosis present

## 2011-05-21 DIAGNOSIS — K8020 Calculus of gallbladder without cholecystitis without obstruction: Secondary | ICD-10-CM | POA: Diagnosis not present

## 2011-05-21 DIAGNOSIS — Z79899 Other long term (current) drug therapy: Secondary | ICD-10-CM | POA: Diagnosis not present

## 2011-05-21 DIAGNOSIS — Z932 Ileostomy status: Secondary | ICD-10-CM | POA: Diagnosis not present

## 2011-05-21 DIAGNOSIS — I498 Other specified cardiac arrhythmias: Secondary | ICD-10-CM | POA: Diagnosis present

## 2011-05-21 DIAGNOSIS — G9340 Encephalopathy, unspecified: Secondary | ICD-10-CM | POA: Diagnosis present

## 2011-05-21 DIAGNOSIS — R1110 Vomiting, unspecified: Secondary | ICD-10-CM | POA: Diagnosis not present

## 2011-05-21 DIAGNOSIS — A4902 Methicillin resistant Staphylococcus aureus infection, unspecified site: Secondary | ICD-10-CM | POA: Diagnosis not present

## 2011-05-21 DIAGNOSIS — Z7982 Long term (current) use of aspirin: Secondary | ICD-10-CM | POA: Diagnosis not present

## 2011-05-21 DIAGNOSIS — I120 Hypertensive chronic kidney disease with stage 5 chronic kidney disease or end stage renal disease: Secondary | ICD-10-CM | POA: Diagnosis present

## 2011-05-21 DIAGNOSIS — N2581 Secondary hyperparathyroidism of renal origin: Secondary | ICD-10-CM | POA: Diagnosis not present

## 2011-05-21 DIAGNOSIS — E785 Hyperlipidemia, unspecified: Secondary | ICD-10-CM | POA: Diagnosis present

## 2011-05-21 DIAGNOSIS — R799 Abnormal finding of blood chemistry, unspecified: Secondary | ICD-10-CM

## 2011-05-21 DIAGNOSIS — D72829 Elevated white blood cell count, unspecified: Secondary | ICD-10-CM | POA: Diagnosis present

## 2011-05-21 DIAGNOSIS — Z8673 Personal history of transient ischemic attack (TIA), and cerebral infarction without residual deficits: Secondary | ICD-10-CM | POA: Diagnosis not present

## 2011-05-21 DIAGNOSIS — R079 Chest pain, unspecified: Secondary | ICD-10-CM | POA: Diagnosis not present

## 2011-05-21 DIAGNOSIS — G9341 Metabolic encephalopathy: Secondary | ICD-10-CM | POA: Diagnosis present

## 2011-05-21 DIAGNOSIS — R634 Abnormal weight loss: Secondary | ICD-10-CM | POA: Diagnosis not present

## 2011-05-21 DIAGNOSIS — I471 Supraventricular tachycardia: Secondary | ICD-10-CM | POA: Diagnosis not present

## 2011-05-21 DIAGNOSIS — N186 End stage renal disease: Secondary | ICD-10-CM | POA: Diagnosis present

## 2011-05-21 DIAGNOSIS — E1169 Type 2 diabetes mellitus with other specified complication: Secondary | ICD-10-CM | POA: Diagnosis not present

## 2011-05-21 DIAGNOSIS — I509 Heart failure, unspecified: Secondary | ICD-10-CM | POA: Diagnosis present

## 2011-05-21 DIAGNOSIS — I359 Nonrheumatic aortic valve disorder, unspecified: Secondary | ICD-10-CM | POA: Diagnosis not present

## 2011-05-21 DIAGNOSIS — E1165 Type 2 diabetes mellitus with hyperglycemia: Secondary | ICD-10-CM | POA: Diagnosis present

## 2011-05-21 DIAGNOSIS — R4182 Altered mental status, unspecified: Secondary | ICD-10-CM | POA: Diagnosis not present

## 2011-05-21 DIAGNOSIS — I2510 Atherosclerotic heart disease of native coronary artery without angina pectoris: Secondary | ICD-10-CM | POA: Diagnosis present

## 2011-05-21 DIAGNOSIS — I428 Other cardiomyopathies: Secondary | ICD-10-CM | POA: Diagnosis not present

## 2011-05-21 LAB — CBC
MCH: 22.7 pg — ABNORMAL LOW (ref 26.0–34.0)
MCHC: 28.3 g/dL — ABNORMAL LOW (ref 30.0–36.0)
MCV: 80.4 fL (ref 78.0–100.0)
Platelets: 350 10*3/uL (ref 150–400)
RDW: 18.4 % — ABNORMAL HIGH (ref 11.5–15.5)

## 2011-05-21 LAB — IRON AND TIBC
Iron: 16 ug/dL — ABNORMAL LOW (ref 42–135)
Saturation Ratios: 13 % — ABNORMAL LOW (ref 20–55)
TIBC: 122 ug/dL — ABNORMAL LOW (ref 250–470)

## 2011-05-21 LAB — RETICULOCYTES: RBC.: 3.21 MIL/uL — ABNORMAL LOW (ref 3.87–5.11)

## 2011-05-21 LAB — FOLATE: Folate: 8.4 ng/mL

## 2011-05-21 LAB — TSH: TSH: 0.44 u[IU]/mL (ref 0.350–4.500)

## 2011-05-21 LAB — COMPREHENSIVE METABOLIC PANEL
ALT: 7 U/L (ref 0–35)
AST: 10 U/L (ref 0–37)
Albumin: 2.4 g/dL — ABNORMAL LOW (ref 3.5–5.2)
Alkaline Phosphatase: 124 U/L — ABNORMAL HIGH (ref 39–117)
CO2: 29 mEq/L (ref 19–32)
Chloride: 93 mEq/L — ABNORMAL LOW (ref 96–112)
GFR calc non Af Amer: 8 mL/min — ABNORMAL LOW (ref >90)
Potassium: 3.2 mEq/L — ABNORMAL LOW (ref 3.5–5.1)
Total Bilirubin: 0.4 mg/dL (ref 0.3–1.2)

## 2011-05-21 LAB — PHOSPHORUS: Phosphorus: 3.3 mg/dL (ref 2.3–4.6)

## 2011-05-21 LAB — GLUCOSE, CAPILLARY: Glucose-Capillary: 253 mg/dL — ABNORMAL HIGH (ref 70–99)

## 2011-05-21 LAB — VITAMIN B12: Vitamin B-12: 418 pg/mL (ref 211–911)

## 2011-05-21 MED ORDER — ISOSORBIDE MONONITRATE ER 60 MG PO TB24
60.0000 mg | ORAL_TABLET | Freq: Every day | ORAL | Status: DC
  Administered 2011-05-21 – 2011-05-27 (×7): 60 mg via ORAL
  Filled 2011-05-21 (×7): qty 1

## 2011-05-21 MED ORDER — PIPERACILLIN-TAZOBACTAM IN DEX 2-0.25 GM/50ML IV SOLN
2.2500 g | Freq: Three times a day (TID) | INTRAVENOUS | Status: DC
  Administered 2011-05-21 – 2011-05-23 (×5): 2.25 g via INTRAVENOUS
  Filled 2011-05-21 (×8): qty 50

## 2011-05-21 MED ORDER — ACETAMINOPHEN 325 MG PO TABS: 650.0000 mg | ORAL_TABLET | Freq: Four times a day (QID) | ORAL | Status: DC | PRN

## 2011-05-21 MED ORDER — GUAIFENESIN-DM 100-10 MG/5ML PO SYRP
5.0000 mL | ORAL_SOLUTION | ORAL | Status: DC | PRN
  Filled 2011-05-21: qty 5

## 2011-05-21 MED ORDER — METOPROLOL TARTRATE 50 MG PO TABS
50.0000 mg | ORAL_TABLET | Freq: Two times a day (BID) | ORAL | Status: DC
  Administered 2011-05-21 – 2011-05-23 (×2): 50 mg via ORAL
  Filled 2011-05-21 (×15): qty 1

## 2011-05-21 MED ORDER — MORPHINE SULFATE 2 MG/ML IJ SOLN
2.0000 mg | INTRAMUSCULAR | Status: DC | PRN
  Administered 2011-05-21 (×4): 2 mg via INTRAVENOUS
  Filled 2011-05-21 (×3): qty 1

## 2011-05-21 MED ORDER — SODIUM CHLORIDE 0.9 % IV SOLN: 250.0000 mL | INTRAVENOUS | Status: DC | PRN

## 2011-05-21 MED ORDER — SODIUM CHLORIDE 0.9 % IJ SOLN
3.0000 mL | Freq: Two times a day (BID) | INTRAMUSCULAR | Status: DC
  Administered 2011-05-21: 09:00:00 via INTRAVENOUS
  Administered 2011-05-21 – 2011-05-26 (×6): 3 mL via INTRAVENOUS

## 2011-05-21 MED ORDER — SODIUM CHLORIDE 0.9 % IJ SOLN
3.0000 mL | Freq: Two times a day (BID) | INTRAMUSCULAR | Status: DC
  Administered 2011-05-22 – 2011-05-26 (×5): 3 mL via INTRAVENOUS

## 2011-05-21 MED ORDER — VANCOMYCIN HCL 1000 MG IV SOLR
2000.0000 mg | Freq: Once | INTRAVENOUS | Status: AC
  Administered 2011-05-21: 2000 mg via INTRAVENOUS
  Filled 2011-05-21: qty 2000

## 2011-05-21 MED ORDER — HYDROCODONE-ACETAMINOPHEN 5-325 MG PO TABS
1.0000 | ORAL_TABLET | ORAL | Status: DC | PRN
  Administered 2011-05-21: 2 via ORAL
  Filled 2011-05-21: qty 2

## 2011-05-21 MED ORDER — DEXTROSE 5 % IV SOLN
1.0000 g | INTRAVENOUS | Status: DC
  Filled 2011-05-21: qty 10

## 2011-05-21 MED ORDER — ALBUTEROL SULFATE (5 MG/ML) 0.5% IN NEBU: 2.5000 mg | INHALATION_SOLUTION | RESPIRATORY_TRACT | Status: DC | PRN

## 2011-05-21 MED ORDER — TERBINAFINE HCL 250 MG PO TABS
250.0000 mg | ORAL_TABLET | Freq: Every day | ORAL | Status: DC
  Administered 2011-05-21 – 2011-05-23 (×2): 250 mg via ORAL
  Filled 2011-05-21 (×7): qty 1

## 2011-05-21 MED ORDER — SIMVASTATIN 10 MG PO TABS
10.0000 mg | ORAL_TABLET | Freq: Every day | ORAL | Status: DC
  Administered 2011-05-21 – 2011-05-26 (×6): 10 mg via ORAL
  Filled 2011-05-21 (×7): qty 1

## 2011-05-21 MED ORDER — DOCUSATE SODIUM 100 MG PO CAPS
100.0000 mg | ORAL_CAPSULE | Freq: Two times a day (BID) | ORAL | Status: DC
  Administered 2011-05-21 – 2011-05-23 (×3): 100 mg via ORAL
  Filled 2011-05-21 (×14): qty 1

## 2011-05-21 MED ORDER — HEPARIN SODIUM (PORCINE) 5000 UNIT/ML IJ SOLN
5000.0000 [IU] | Freq: Three times a day (TID) | INTRAMUSCULAR | Status: DC
  Administered 2011-05-21 – 2011-05-23 (×6): 5000 [IU] via SUBCUTANEOUS
  Filled 2011-05-21 (×10): qty 1

## 2011-05-21 MED ORDER — ONDANSETRON HCL 4 MG PO TABS: 4.0000 mg | ORAL_TABLET | Freq: Four times a day (QID) | ORAL | Status: DC | PRN

## 2011-05-21 MED ORDER — ASPIRIN 81 MG PO CHEW
81.0000 mg | CHEWABLE_TABLET | Freq: Every day | ORAL | Status: DC
  Administered 2011-05-21 – 2011-05-27 (×7): 81 mg via ORAL
  Filled 2011-05-21 (×7): qty 1

## 2011-05-21 MED ORDER — INSULIN ASPART 100 UNIT/ML ~~LOC~~ SOLN
0.0000 [IU] | Freq: Three times a day (TID) | SUBCUTANEOUS | Status: DC
  Administered 2011-05-21: 1 [IU] via SUBCUTANEOUS
  Administered 2011-05-21 (×2): 5 [IU] via SUBCUTANEOUS
  Administered 2011-05-22: 1 [IU] via SUBCUTANEOUS
  Administered 2011-05-22: 2 [IU] via SUBCUTANEOUS
  Administered 2011-05-23: 5 [IU] via SUBCUTANEOUS
  Administered 2011-05-23: 3 [IU] via SUBCUTANEOUS
  Administered 2011-05-23: 2 [IU] via SUBCUTANEOUS
  Administered 2011-05-24: 3 [IU] via SUBCUTANEOUS

## 2011-05-21 MED ORDER — INSULIN LISPRO PROT & LISPRO (50-50 MIX) 100 UNIT/ML ~~LOC~~ SUSP
20.0000 [IU] | Freq: Every day | SUBCUTANEOUS | Status: DC
  Administered 2011-05-21 – 2011-05-27 (×6): 20 [IU] via SUBCUTANEOUS
  Filled 2011-05-21: qty 3

## 2011-05-21 MED ORDER — INSULIN ASPART 100 UNIT/ML ~~LOC~~ SOLN
0.0000 [IU] | Freq: Every day | SUBCUTANEOUS | Status: DC
  Administered 2011-05-23: 2 [IU] via SUBCUTANEOUS

## 2011-05-21 MED ORDER — POTASSIUM CHLORIDE CRYS ER 20 MEQ PO TBCR
20.0000 meq | EXTENDED_RELEASE_TABLET | Freq: Once | ORAL | Status: AC
  Administered 2011-05-21: 20 meq via ORAL
  Filled 2011-05-21: qty 1

## 2011-05-21 MED ORDER — ACETAMINOPHEN 650 MG RE SUPP: 650.0000 mg | Freq: Four times a day (QID) | RECTAL | Status: DC | PRN

## 2011-05-21 MED ORDER — SODIUM CHLORIDE 0.9 % IJ SOLN: 3.0000 mL | INTRAMUSCULAR | Status: DC | PRN

## 2011-05-21 MED ORDER — CALCIUM ACETATE 667 MG PO CAPS
2001.0000 mg | ORAL_CAPSULE | Freq: Three times a day (TID) | ORAL | Status: DC
  Administered 2011-05-21: 2001 mg via ORAL
  Administered 2011-05-22 – 2011-05-23 (×3): 1334 mg via ORAL
  Administered 2011-05-23 – 2011-05-24 (×2): 2001 mg via ORAL
  Filled 2011-05-21 (×12): qty 3

## 2011-05-21 MED ORDER — AMIODARONE HCL 200 MG PO TABS
200.0000 mg | ORAL_TABLET | Freq: Every day | ORAL | Status: DC
  Administered 2011-05-21 – 2011-05-27 (×7): 200 mg via ORAL
  Filled 2011-05-21 (×8): qty 1

## 2011-05-21 MED ORDER — ONDANSETRON HCL 4 MG/2ML IJ SOLN
4.0000 mg | Freq: Four times a day (QID) | INTRAMUSCULAR | Status: DC | PRN
  Administered 2011-05-21 – 2011-05-22 (×2): 4 mg via INTRAVENOUS
  Filled 2011-05-21 (×2): qty 2

## 2011-05-21 MED ORDER — SODIUM CHLORIDE 0.9 % IV SOLN: INTRAVENOUS | Status: DC

## 2011-05-21 NOTE — Progress Notes (Addendum)
Subjective: Patient seen and examined this morning. She is more alert and oriented than mentioned in H&P. However does not know why she is in the hospital and feels she may have missed her last HD. Denies any dysuria or abdominal pain. Says she had some fever at home.   Objective:  Vital signs in last 24 hours:  Filed Vitals:   05/21/11 0047 05/21/11 0139 05/21/11 0503 05/21/11 0933  BP: 167/64 165/57 135/58 157/88  Pulse: 97 95 87 88  Temp:  99.9 F (37.7 C) 97.8 F (36.6 C) 97.6 F (36.4 C)  TempSrc:  Oral Oral Oral  Resp: 31 20 19 19   Weight:  93.3 kg (205 lb 11 oz)    SpO2: 100% 95% 100% 100%    Intake/Output from previous day:   Intake/Output Summary (Last 24 hours) at 05/21/11 1034 Last data filed at 05/21/11 0900  Gross per 24 hour  Intake    360 ml  Output     45 ml  Net    315 ml    Physical Exam:  General: elderly female in no acute distress. HEENT: no pallor, no icterus, moist oral mucosa, no JVD, no lymphadenopathy Heart: Normal  s1 &s2  Regular rate and rhythm, without murmurs, rubs, gallops. Lungs: Clear to auscultation bilaterally. Abdomen: colostomy bag in place, Soft, nontender, nondistended, positive bowel sounds. Extremities: No clubbing cyanosis or edema with positive pedal pulses. Neuro: Alert, awake, oriented x3 with occasional confusion with dates   Lab Results:  Basic Metabolic Panel:    Component Value Date/Time   NA 137 05/21/2011 0715   K 3.2* 05/21/2011 0715   CL 93* 05/21/2011 0715   CO2 29 05/21/2011 0715   BUN 26* 05/21/2011 0715   CREATININE 5.07* 05/21/2011 0715   GLUCOSE 289* 05/21/2011 0715   CALCIUM 8.9 05/21/2011 0715   CALCIUM 7.5* 10/14/2008 1049   CBC:    Component Value Date/Time   WBC 17.9* 05/21/2011 0715   HGB 7.3* 05/21/2011 0715   HCT 25.8* 05/21/2011 0715   PLT 350 05/21/2011 0715   MCV 80.4 05/21/2011 0715   NEUTROABS 17.4* 05/20/2011 2016   LYMPHSABS 0.7 05/20/2011 2016   MONOABS 1.1* 05/20/2011 2016   EOSABS 0.0  05/20/2011 2016   BASOSABS 0.0 05/20/2011 2016    Recent Results (from the past 240 hour(s))  MRSA PCR SCREENING     Status: Abnormal   Collection Time   05/21/11  3:02 AM      Component Value Range Status Comment   MRSA by PCR POSITIVE (*) NEGATIVE  Final     Studies/Results: Ct Abdomen Pelvis Wo Contrast  05/21/2011  *RADIOLOGY REPORT*  Clinical Data: Weight loss.  Nausea and vomiting.  The  CT ABDOMEN AND PELVIS WITHOUT CONTRAST  Technique:  Multidetector CT imaging of the abdomen and pelvis was performed following the standard protocol without intravenous contrast.  Comparison: 10/28/2009  Findings: Atelectasis in the lung bases.  Layering of multiple stones in the gallbladder, similar previous study.  Low attenuation lesion in the spleen is stable since the previous study.  There is a large left anterior abdominal wall hernia arising between the rectus abdominous and flank muscles. The hernia contains colon, small bowel, and fat and appears to represent a peristomal hernia.  This appears stable since the previous study.  There is a midline abdominal hernia below the level in the emboli this which appears represent a peristomal hernia arising from an ileal conduit.  Infiltration or edema in the  subcutaneous fat.  The unenhanced liver is unremarkable.  Postoperative changes with bladder resection and right lower quadrant ileal conduit.  The left ureter is distended with.  Renal stranding on the left suggesting obstruction.  No significant obstruction of the right kidney. Descending colostomy in the left lower quadrant.  Calcification of the abdominal aorta without aneurysm.  Pelvis:  No free or loculated pelvic fluid collections.  No significant pelvic lymphadenopathy.  Degenerative changes in the lumbar spine.  IMPRESSION: No other postoperative changes with resection of the bladder and right lower quadrant ileal conduit.  Suggestion of left ureteral obstruction.  Left lower quadrant colostomy.  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  No evidence of bowel obstruction.  Cholelithiasis.  Stable splenic lesion.  Original Report Authenticated By: Marlon Pel, M.D.   Dg Chest 2 View  05/20/2011  *RADIOLOGY REPORT*  Clinical Data: Vomiting and altered mental status.  CHEST - 2 VIEW  Comparison: 10/05/2010  Findings: Shallow inspiration.  Cardiac enlargement with normal pulmonary vascularity for technique.  No focal airspace consolidation in the lungs.  No blunting of costophrenic angles. No pneumothorax.  Degenerative changes in the spine.  No significant change since previous study.  Motion artifact.  IMPRESSION: Cardiac enlargement without vascular congestion or edema.  Original Report Authenticated By: Marlon Pel, M.D.   Ct Head Wo Contrast  05/20/2011  *RADIOLOGY REPORT*  Clinical Data: Altered mental status and vomiting after eating. Diabetes.  End-stage renal disease.  CT HEAD WITHOUT CONTRAST  Technique:  Contiguous axial images were obtained from the base of the skull through the vertex without contrast.  Comparison: MRI 01/09/2010.  CT 01/08/2010  Findings: Technically limited study due to motion artifact.  There is diffuse cerebral atrophy.  Low attenuation changes throughout the deep and periventricular white matter consistent with small vessel ischemia.  Previously identified left thalamic infarct is less well visualized today.  Suggestion of some low attenuation change in the pons which might represent small vessel ischemia. Similar changes were present on the previous MRI.  No mass effect or midline shift.  No abnormal extra-axial fluid collections.  Wallace Cullens- white matter junctions are distinct.  Basal cisterns are not effaced.  No evidence of acute intracranial hemorrhage.  No depressed skull fractures.  Visualized paranasal sinuses demonstrate retention cyst in the left maxillary antrum.  Mastoid air cells are not opacified.  Vascular calcifications.   IMPRESSION: Chronic atrophy and small vessel ischemic changes.  No acute intracranial abnormalities.  Original Report Authenticated By: Marlon Pel, M.D.    Medications: Scheduled Meds:   . amiodarone  200 mg Oral Daily  . aspirin  81 mg Oral Daily  . calcium acetate  2,001 mg Oral TID  . cefTRIAXone (ROCEPHIN)  IV  1 g Intravenous Once  . cefTRIAXone (ROCEPHIN) IVPB 1 gram/50 mL D5W  1 g Intravenous Q24H  . docusate sodium  100 mg Oral BID  . heparin  5,000 Units Subcutaneous Q8H  . insulin aspart  0-5 Units Subcutaneous QHS  . insulin aspart  0-9 Units Subcutaneous TID WC  . insulin lispro protamine-insulin lispro  20 Units Subcutaneous Q breakfast  . isosorbide mononitrate  60 mg Oral Daily  . metoprolol  50 mg Oral BID  . simvastatin  10 mg Oral q1800  . sodium chloride  3 mL Intravenous Q12H  . sodium chloride  3 mL Intravenous Q12H  . terbinafine  250 mg Oral Daily   Continuous Infusions:   .  DISCONTD: sodium chloride 80 mL/hr at 05/21/11 0003  . DISCONTD: sodium chloride     PRN Meds:.sodium chloride, acetaminophen, acetaminophen, albuterol, guaiFENesin-dextromethorphan, HYDROcodone-acetaminophen, morphine injection, ondansetron (ZOFRAN) IV, ondansetron, sodium chloride  Assessment/   66 y/o female with multiple medical problems including CAD, DM, ESRD on HD, colon ca s/p surgery with colostomy presents with AMS likely in the setting of UTI.   Plan: AMS with UTI (lower urinary tract infection) Patient much oriented this am. Has hx of UTI in past with citrobacter and klebsiella in cx which was pansensitive. patient has significant leucocytosis and low grade temp on presentation. Started on IV ceftraixone i will switch her abx to zosyn for  broader coverage until cx and sensitivity is back.    ESRD (end stage renal disease) on dialysis Will inform renal . HD days are M,W,F   DM (diabetes mellitus), type 2, uncontrolled, with renal complications Cont home  dose insulin and SSI   Coronary artery disease Cont BB, statin and ASA  Hx of atrial tachycardia  on amiodarone   Chest pain Likely atypical and denies any symptoms at present, monitor on tele  mild treponemia possible related to demand ischemia    Anemia Baseline Hb around 8-9 , 7.3 this am Will monitor closely  DVT prophylaxis  Diet; cardiac/ diabetic  Full code      LOS: 1 day   Nautica Hotz 05/21/2011, 10:34 AM

## 2011-05-21 NOTE — Progress Notes (Signed)
Pt admitted to [id number] on  Tele from ED. Pt comes from home with daughter. Daughter not available at this time to assist with admission history. Pt is alert to self and place, at times. Able to follow commands, not verbal at times and her responses can be delayed. Pt is currently on bedrest. Bed alarm, red socks and yellow arm band are on for increased safety awareness. Pt's skin is dry, no other skin issues noted. Will continue to monitor pt

## 2011-05-21 NOTE — H&P (Signed)
PCP:   Sonda Primes, MD, MD    Chief Complaint  Confusion  HPI: [name] is a 66 y.o. female   has a past medical history of LBBB (left bundle branch block); Other and unspecified hyperlipidemia; Mitral valve insufficiency and aortic valve insufficiency; Cardiomyopathy- mixed; Unspecified essential hypertension; Type II or unspecified type diabetes mellitus without mention of complication, not stated as uncontrolled; Personal history of malignant neoplasm of large intestine; Renal failure; CHF (congestive heart failure); Stroke; Anemia; Cancer; Coronary artery disease; and Atrial tachycardia.   Presented with  Patient herself unable to provide any history pain or emergency department physician history was obtained from her daughter stated that patient has been confused. Have had bloody urine coming  out of her urostomy. She had poor by mouth intake. Have had nausea and vomiting for the past month at least. Low-grade fevers. Patient is status post iliel conduit as well as colostomy done in 1980's for colon cancer. Patient herself unable to provide any history whatsoever she is only moaning. She did him shake her head "no" staiting she has no pain.   Review of Systems:  Unable to obtain  Past Medical History: Past Medical History  Diagnosis Date  . LBBB (left bundle branch block)   . Other and unspecified hyperlipidemia   . Mitral valve insufficiency and aortic valve insufficiency   . Cardiomyopathy- mixed   . Unspecified essential hypertension   . Type II or unspecified type diabetes mellitus without mention of complication, not stated as uncontrolled   . Personal history of malignant neoplasm of large intestine   . Renal failure     ESRD, Dr Eliott Nine  . CHF (congestive heart failure)   . Stroke   . Anemia   . Cancer     history of colon cancer  . Coronary artery disease     severely decreased EF.   [name] Atrial tachycardia     on amiod   Past Surgical History  Procedure  Date  . Colostomy   . Revision urostomy cutaneous   . Esophagogastroduodenoscopy 03-18-04  . Electrocardiogram 04-27-06  . Placement of new left forearm arteriovenous graft 03-11-08  . Left heart catheterization and right heart catheterization 12-10    R. heart cath showed elevated left and right heart filling pressures w/ pulmonary artery pressure elevated mildly out of proportion to the wedge. The left heart cath showed diffuse distal vessel disease as well as a 75% stenosis in the mid circumflex w/ a 90% stenosis of the ostial first obtuse marginal. These lesions were in close proximity. there was a 60-70%mild RCA stenosis.   . Arteriovenous graft placement 2010  . 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 Authorizing Provider  amiodarone (PACERONE) 200 MG tablet Tuesday, Thursday, Saturday, Sunday 05/02/11  Yes Duke Salvia, MD  aspirin 81 MG chewable tablet Chew 81 mg by mouth daily.     Yes Historical Provider, MD  calcium acetate (PHOSLO) 667 MG capsule Take 2,001 mg by mouth 3 (three) times daily. Take 2 capsules in the middle of each meal each day   Yes Historical Provider, MD  insulin lispro protamine-insulin lispro (HUMALOG 50/50) (50-50) 100 UNIT/ML SUSP Inject 20 Units into the skin daily with breakfast.    Yes Historical Provider, MD  isosorbide mononitrate (IMDUR) 60 MG 24 hr tablet Take 60 mg by mouth daily.  Yes Historical Provider, MD  metoprolol (LOPRESSOR) 100 MG tablet Take 50 mg by mouth 2 (two) times daily. Non dialysis days only  06/01/10  Yes Georgina Quint Plotnikov, MD  simvastatin (ZOCOR) 10 MG tablet Take 10 mg by mouth daily.   Yes Historical Provider, MD  terbinafine (LAMISIL) 250 MG tablet Take 250 mg by mouth daily. Non-dialysis days only   Yes Historical Provider, MD  Ostomy Supplies (ACTIVE LIFE CONVEX 1-PC ) POUCH MISC 1 Device by Does  not apply route every 3 (three) days. 04/11/11   Tresa Garter, MD  Ostomy Supplies (NATURA DURAHESIVE MOLDABLE) WAFR 1 Wafer by Does not apply route 3 (three) times a week. 04/11/11   Tresa Garter, MD  Ostomy Supplies (SUR-FIT NATURA UROSTOMY Hauser) [facility] MISC 1 Device by Does not apply route every 3 (three) days. 04/11/11   Tresa Garter, MD    Allergies:   Allergies  Allergen Reactions  . Ace Inhibitors   . Enalapril   . Lisinopril     REACTION: unspecified  . Omnipaque (Iohexol)      reports that she has never smoked. She has never used smokeless tobacco. She reports that she does not drink alcohol or use illicit drugs.   Family History: family history includes Cancer in her sister; Coronary artery disease in her mother; Diabetes in her father; and Hypertension in her father, mother, and other.    Physical Exam: Patient Vitals for the past 24 hrs:  BP Temp Temp src Pulse Resp SpO2  05/21/11 0047 167/64 mmHg - - 97  31  100 %  05/20/11 2242 155/84 mmHg - - 100  12  99 %  05/20/11 1929 157/67 mmHg - - 96  23  100 %  05/20/11 1839 123/51 mmHg 99.9 F (37.7 C) Oral 99  21  98 %    1. General:  in No Acute distress 2. Psychological: Alert but not Oriented 3. Head/ENT:   Moist  Mucous Membranes                          Head Non traumatic, neck supple                          Normal  Dentition 4. SKIN: normal Skin turgor,  Skin clean Dry and intact no rash 5. Heart: Regular rate and rhythm no Murmur, Rub or gallop 6. Lungs: Clear to auscultation bilaterally, no wheezes or crackles   7. Abdomen: Soft, non-tender, Non distended, obese, urostomy bag full of milky/ bloody fluid reminiscent of purulent 8. Lower extremities: no clubbing, cyanosis, or edema, right forefoot surgically absent 9. Neurologically Grossly intact, moving all 4 extremities equally 10. MSK: Normal range of motion  body mass index is unknown because there is no height or weight on  file.   Labs on Admission:   [facility] 05/20/11 2016  NA 136  K 3.0*  CL 92*  CO2 30  GLUCOSE 320*  BUN 20  CREATININE 4.33*  CALCIUM 8.8  MG --  PHOS --    Basename 05/20/11 2016  AST 10  ALT 6  ALKPHOS 140*  BILITOT 0.5  PROT 7.8  ALBUMIN 2.7*   No results found for this basename: LIPASE:2,AMYLASE:2 in the last 72 hours  Basename 05/20/11 2016  WBC 19.3*  NEUTROABS 17.4*  HGB 7.9*  HCT 27.7*  MCV 82.2  PLT 401*   No results found  for this basename: CKTOTAL:3,CKMB:3,CKMBINDEX:3,TROPONINI:3 in the last 72 hours No results found for this basename: TSH,T4TOTAL,FREET3,T3FREE,THYROIDAB in the last 72 hours No results found for this basename: VITAMINB12:2,FOLATE:2,FERRITIN:2,TIBC:2,IRON:2,RETICCTPCT:2 in the last 72 hours Lab Results  Component Value Date   HGBA1C 7.2* 09/29/2010    The CrCl is unknown because both a height and weight (above a minimum accepted value) are required for this calculation. ABG    Component Value Date/Time   PHART 7.408* 12/24/2008 1222   HCO3 26.2* 12/24/2008 1222   TCO2 29 10/04/2010 1809   ACIDBASEDEF 1.0 12/24/2008 1154   O2SAT 97.0 12/24/2008 1222     Lab Results  Component Value Date   DDIMER  Value: 1.59        AT THE INHOUSE ESTABLISHED CUTOFF VALUE OF 0.48 ug/mL FEU, THIS ASSAY HAS BEEN DOCUMENTED IN THE LITERATURE TO HAVE A SENSITIVITY AND NEGATIVE PREDICTIVE VALUE OF AT LEAST 98 TO 99%.  THE TEST RESULT SHOULD BE CORRELATED WITH AN ASSESSMENT OF THE CLINICAL PROBABILITY OF DVT / VTE.* 10/28/2008     Other results:  I have pearsonaly reviewed this: ECG REPORT    Rhythm: LBBB ST&T Change: Unable to assess, no change from prior  UA evidence of infection  Cultures:    Component Value Date/Time   SDES WOUND TOE RIGHT 10/07/2010 0948   SPECREQUEST RIGHT GREAT TOE PT ON VANCO CEFTAZIDINE 10/07/2010 0948   CULT  Value: MODERATE METHICILLIN RESISTANT STAPHYLOCOCCUS AUREUS Note: RIFAMPIN AND GENTAMICIN SHOULD NOT BE USED AS  SINGLE DRUGS FOR TREATMENT OF STAPH INFECTIONS. CRITICAL RESULT CALLED TO, READ BACK BY AND VERIFIED WITH: ALISON DANIELS BY INGRAM A 10/1 10/07/2010 0948   REPTSTATUS 10/10/2010 FINAL 10/07/2010 0948       Radiological Exams on Admission: Dg Chest 2 View  05/20/2011  *RADIOLOGY REPORT*  Clinical Data: Vomiting and altered mental status.  CHEST - 2 VIEW  Comparison: 10/05/2010  Findings: Shallow inspiration.  Cardiac enlargement with normal pulmonary vascularity for technique.  No focal airspace consolidation in the lungs.  No blunting of costophrenic angles. No pneumothorax.  Degenerative changes in the spine.  No significant change since previous study.  Motion artifact.  IMPRESSION: Cardiac enlargement without vascular congestion or edema.  Original Report Authenticated By: Marlon Pel, M.D.   Ct Head Wo Contrast  05/20/2011  *RADIOLOGY REPORT*  Clinical Data: Altered mental status and vomiting after eating. Diabetes.  End-stage renal disease.  CT HEAD WITHOUT CONTRAST  Technique:  Contiguous axial images were obtained from the base of the skull through the vertex without contrast.  Comparison: MRI 01/09/2010.  CT 01/08/2010  Findings: Technically limited study due to motion artifact.  There is diffuse cerebral atrophy.  Low attenuation changes throughout the deep and periventricular white matter consistent with small vessel ischemia.  Previously identified left thalamic infarct is less well visualized today.  Suggestion of some low attenuation change in the pons which might represent small vessel ischemia. Similar changes were present on the previous MRI.  No mass effect or midline shift.  No abnormal extra-axial fluid collections.  Wallace Cullens- white matter junctions are distinct.  Basal cisterns are not effaced.  No evidence of acute intracranial hemorrhage.  No depressed skull fractures.  Visualized paranasal sinuses demonstrate retention cyst in the left maxillary antrum.  Mastoid air cells are not  opacified.  Vascular calcifications.  IMPRESSION: Chronic atrophy and small vessel ischemic changes.  No acute intracranial abnormalities.  Original Report Authenticated By: Marlon Pel, M.D.    Assessment/Plan  66 year old female with history of end-stage renal disease and urostomy status post colon cancer resection. Comes in with what seems to be infection likely UTI although other intra-abdominal infection could not be ruled out  Present on Admission:  .UTI (lower urinary tract infection) - for now cover with Rocephin and await results of urine culture even patient denied any do not expect [place] to sterilize the urine, given low-grade fever and altered mental status other sources of infection should be considered. Will obtain CT scan of the abdomen to further evaluate the anatomy and assess for any abscess  .ESRD (end stage renal disease) on dialysis - would need to contact nephrology the morning patient to have dialysis on Monday  .Nausea & vomiting - for now treat symptomatically she may benefit from gastric emptying study   .DM (diabetes mellitus), type 2, uncontrolled, with renal complications - sliding scale insulin  .Coronary artery disease -will cycle cardiac markers and observe on telemetry  .Chest pain- given history of coronary disease will admit and observe on telemetry cycle cardiac markers  .Leucocytosis - secondary to infectious process most likely  .Encephalopathy - will monitor for improvement that she has been seen by  .LBBB stable  .Anemia we'll obtain anemia panel currently no indication for transfusion  .Elevated troponin - [facility] cardiology has seen the patient feels that this is most likely nonischemic in nature   Prophylaxis: Heparin subcutaneous Protonix  CODE STATUS: CODE STATUS should be clarified in a.m. for now full code  I have spent a total of 60 min on this admission  Sten Dematteo 05/21/2011, 1:40 AM

## 2011-05-21 NOTE — Progress Notes (Signed)
CRITICAL VALUE ALERT  Critical value received:  Positive Blood cultures X2  Date of notification:  05/21/11  Time of notification:  1940  Critical value read back:yes  Nurse who received alert:  Kizzie Fantasia RN  MD notified (1st page):  Lenny Pastel NP  Time of first page:  1942  MD notified (2nd page):  Time of second page:  Responding MD:  Lenny Pastel NP  Time MD responded:  [phone]

## 2011-05-21 NOTE — Progress Notes (Signed)
ANTIBIOTIC CONSULT NOTE - INITIAL  Pharmacy Consult for vancomycin Indication: GPC in clusters in blood cx  Allergies  Allergen Reactions  . Ace Inhibitors   . Enalapril   . Lisinopril     REACTION: unspecified  . Omnipaque (Iohexol)     Patient Measurements: Height: 5' (152.4 cm) Weight: 205 lb 11 oz (93.3 kg) IBW/kg (Calculated) : 45.5  Adjusted Body Weight:   Vital Signs: Temp: 98.1 F (36.7 C) (05/12 1850) Temp src: Oral (05/12 1850) BP: 104/48 mmHg (05/12 1850) Pulse Rate: 86  (05/12 1850) Intake/Output from previous day: 05/11 0701 - 05/12 0700 In: -  Out: 45 [Urine:45] Intake/Output from this shift:    Labs:  Basename 05/21/11 0715 05/20/11 2016  WBC 17.9* 19.3*  HGB 7.3* 7.9*  PLT 350 401*  LABCREA -- --  CREATININE 5.07* 4.33*   Estimated Creatinine Clearance: 11.3 ml/min (by C-G formula based on Cr of 5.07). No results found for this basename: VANCOTROUGH:2,VANCOPEAK:2,VANCORANDOM:2,GENTTROUGH:2,GENTPEAK:2,GENTRANDOM:2,TOBRATROUGH:2,TOBRAPEAK:2,TOBRARND:2,AMIKACINPEAK:2,AMIKACINTROU:2,AMIKACIN:2, in the last 72 hours   Microbiology: Recent Results (from the past 720 hour(s))  CULTURE, BLOOD (ROUTINE X 2)     Status: Normal (Preliminary result)   Collection Time   05/20/11  8:39 PM      Component Value Range Status Comment   Specimen Description BLOOD RIGHT HAND   Final    Special Requests BOTTLES DRAWN AEROBIC AND ANAEROBIC 10CC EA   Final    Culture  Setup Time [id number]   Final    Culture     Final    Value: GRAM POSITIVE COCCI IN CLUSTERS     Note: Gram Stain Report Called to,Read Back By and Verified With: RN VICKY M. ON 05/21/11 AT 1930 BY TEDAR   Report Status PENDING   Incomplete   CULTURE, BLOOD (ROUTINE X 2)     Status: Normal (Preliminary result)   Collection Time   05/20/11  8:46 PM      Component Value Range Status Comment   Specimen Description BLOOD RIGHT ARM   Final    Special Requests BOTTLES DRAWN AEROBIC ONLY 8CC   Final    Culture  Setup Time [id number]   Final    Culture     Final    Value: GRAM POSITIVE COCCI IN CLUSTERS     Note: Gram Stain Report Called to,Read Back By and Verified With: RN VICKY M. ON 05/21/11 AT 1930 BY TEDAR   Report Status PENDING   Incomplete   MRSA PCR SCREENING     Status: Abnormal   Collection Time   05/21/11  3:02 AM      Component Value Range Status Comment   MRSA by PCR POSITIVE (*) NEGATIVE  Final     Medical History: Past Medical History  Diagnosis Date  . LBBB (left bundle branch block)   . Other and unspecified hyperlipidemia   . Mitral valve insufficiency and aortic valve insufficiency   . Cardiomyopathy- mixed   . Unspecified essential hypertension   . Type II or unspecified type diabetes mellitus without mention of complication, not stated as uncontrolled   . Personal history of malignant neoplasm of large intestine   . Renal failure     ESRD, Dr Eliott Nine  . CHF (congestive heart failure)   . Stroke   . Anemia   . Cancer     history of colon cancer  . Coronary artery disease     severely decreased EF.   [name] Atrial tachycardia     on amiod  Medications:  Scheduled:    . amiodarone  200 mg Oral Daily  . aspirin  81 mg Oral Daily  . calcium acetate  2,001 mg Oral TID  . cefTRIAXone (ROCEPHIN)  IV  1 g Intravenous Once  . docusate sodium  100 mg Oral BID  . heparin  5,000 Units Subcutaneous Q8H  . insulin aspart  0-5 Units Subcutaneous QHS  . insulin aspart  0-9 Units Subcutaneous TID WC  . insulin lispro protamine-insulin lispro  20 Units Subcutaneous Q breakfast  . isosorbide mononitrate  60 mg Oral Daily  . metoprolol  50 mg Oral BID  . piperacillin-tazobactam (ZOSYN)  IV  2.25 g Intravenous Q8H  . potassium chloride  20 mEq Oral Once  . simvastatin  10 mg Oral q1800  . sodium chloride  3 mL Intravenous Q12H  . sodium chloride  3 mL Intravenous Q12H  . terbinafine  250 mg Oral Daily  . DISCONTD: cefTRIAXone (ROCEPHIN) IVPB 1 gram/50 mL D5W  1 g  Intravenous Q24H   Infusions:    . DISCONTD: sodium chloride 80 mL/hr at 05/21/11 0003  . DISCONTD: sodium chloride     Assessment: 66 yo female with GPC in cluster bacteremia will be put on vancomycin therapy.  Usual HD is MWF (no HD ordered yet).  Goal of Therapy:  Pre-HD vancomycin level 15-25   Plan:  1) Vancomycin 2g iv x1 today 2) Follow up on HD schedule in am for further dosing.   Mairyn Lenahan, Tsz-Yin 05/21/2011,7:52 PM

## 2011-05-21 NOTE — Progress Notes (Signed)
Around 650 Went in to check on patient.  Pt was able to respond back properly. Pt was alert x3.  She Stated that she did not remember some of what happened while in ED and when she was transferred to her room.  I explained to patient that she was moaning and was not able to follow commands and that she did say at one time that her stomach hurt and I explained to her i gave her some pain medication. She told me that she did not remember having stomach pain or saying her stomach hurt. I recall nurse in ED stated that pt began moaning and her orientation decreased at that time.  Pt had been on and off moaning until 530 this am when she was sleeping.  Will continue to monitor pt.

## 2011-05-21 NOTE — Progress Notes (Signed)
ANTIBIOTIC CONSULT NOTE - INITIAL  Pharmacy Consult for Zosyn Indication: Possible UTI  Assessment: 65yoF with ESRD on dialysis admitted with AMS and bloody urine from urostomy being started on Zosyn for possible UTI. Urine and Blood Cultures pending, MRSA screen (+); WBC 17.9, Tmax 99.9'F. Also received 1g ceftriaxone in the ED today.  Goal of Therapy:  Clinical improvement  Plan:  1. Start Zosyn 2.25g IV Q8 hrs 2. F/U cultures and clinical status  Benjaman Pott, PharmD     Pager [phone] 05/21/2011   10:58 AM  -------------------------------------------------------------  Allergies  Allergen Reactions  . Ace Inhibitors   . Enalapril   . Lisinopril     REACTION: unspecified  . Omnipaque (Iohexol)     Patient Measurements: Weight: 205 lb 11 oz (93.3 kg)   Vital Signs: Temp: 97.6 F (36.4 C) (05/12 0933) Temp src: Oral (05/12 0933) BP: 157/88 mmHg (05/12 0933) Pulse Rate: 88  (05/12 0933) Intake/Output from previous day: 05/11 0701 - 05/12 0700 In: -  Out: 45 [Urine:45] Intake/Output from this shift: Total I/O In: 360 [P.O.:360] Out: -   Labs:  Basename 05/21/11 0715 05/20/11 2016  WBC 17.9* 19.3*  HGB 7.3* 7.9*  PLT 350 401*  LABCREA -- --  CREATININE 5.07* 4.33*   Microbiology: Recent Results (from the past 720 hour(s))  MRSA PCR SCREENING     Status: Abnormal   Collection Time   05/21/11  3:02 AM      Component Value Range Status Comment   MRSA by PCR POSITIVE (*) NEGATIVE  Final     Medical History: Past Medical History  Diagnosis Date  . LBBB (left bundle branch block)   . Other and unspecified hyperlipidemia   . Mitral valve insufficiency and aortic valve insufficiency   . Cardiomyopathy- mixed   . Unspecified essential hypertension   . Type II or unspecified type diabetes mellitus without mention of complication, not stated as uncontrolled   . Personal history of malignant neoplasm of large intestine   . Renal failure     ESRD, Dr  Eliott Nine  . CHF (congestive heart failure)   . Stroke   . Anemia   . Cancer     history of colon cancer  . Coronary artery disease     severely decreased EF.   [name] Atrial tachycardia     on amiod

## 2011-05-22 ENCOUNTER — Inpatient Hospital Stay (HOSPITAL_COMMUNITY): Payer: BC Managed Care – PPO

## 2011-05-22 DIAGNOSIS — N186 End stage renal disease: Secondary | ICD-10-CM

## 2011-05-22 DIAGNOSIS — E1165 Type 2 diabetes mellitus with hyperglycemia: Secondary | ICD-10-CM

## 2011-05-22 DIAGNOSIS — E1169 Type 2 diabetes mellitus with other specified complication: Secondary | ICD-10-CM

## 2011-05-22 DIAGNOSIS — E782 Mixed hyperlipidemia: Secondary | ICD-10-CM

## 2011-05-22 LAB — BASIC METABOLIC PANEL
BUN: 35 mg/dL — ABNORMAL HIGH (ref 6–23)
Chloride: 96 mEq/L (ref 96–112)
Creatinine, Ser: 5.55 mg/dL — ABNORMAL HIGH (ref 0.50–1.10)
GFR calc Af Amer: 8 mL/min — ABNORMAL LOW (ref >90)
Glucose, Bld: <20 mg/dL — CL (ref 70–99)
Potassium: 3.9 mEq/L (ref 3.5–5.1)

## 2011-05-22 LAB — GLUCOSE, CAPILLARY
Glucose-Capillary: 100 mg/dL — ABNORMAL HIGH (ref 70–99)
Glucose-Capillary: 45 mg/dL — ABNORMAL LOW (ref 70–99)
Glucose-Capillary: 49 mg/dL — ABNORMAL LOW (ref 70–99)
Glucose-Capillary: 96 mg/dL (ref 70–99)
Glucose-Capillary: 176 mg/dL — ABNORMAL HIGH (ref 70–99)

## 2011-05-22 LAB — URINE CULTURE
Colony Count: >=100000
Culture  Setup Time: 201305120205

## 2011-05-22 LAB — CBC
HCT: 27.4 % — ABNORMAL LOW (ref 36.0–46.0)
Hemoglobin: 7.7 g/dL — ABNORMAL LOW (ref 12.0–15.0)
MCHC: 28.1 g/dL — ABNORMAL LOW (ref 30.0–36.0)
MCV: 80.8 fL (ref 78.0–100.0)
RDW: 18.7 % — ABNORMAL HIGH (ref 11.5–15.5)
WBC: 13.3 10*3/uL — ABNORMAL HIGH (ref 4.0–10.5)

## 2011-05-22 MED ORDER — DEXTROSE 50 % IV SOLN
INTRAVENOUS | Status: AC
  Administered 2011-05-22: 50 mL
  Filled 2011-05-22: qty 50

## 2011-05-22 MED ORDER — CHLORHEXIDINE GLUCONATE CLOTH 2 % EX PADS
6.0000 | MEDICATED_PAD | Freq: Every day | CUTANEOUS | Status: AC
  Administered 2011-05-22 – 2011-05-26 (×5): 6 via TOPICAL

## 2011-05-22 MED ORDER — DARBEPOETIN ALFA-POLYSORBATE 200 MCG/0.4ML IJ SOLN
INTRAMUSCULAR | Status: AC
  Administered 2011-05-22: 200 ug via INTRAVENOUS
  Filled 2011-05-22: qty 0.4

## 2011-05-22 MED ORDER — VANCOMYCIN HCL IN DEXTROSE 1-5 GM/200ML-% IV SOLN
1000.0000 mg | INTRAVENOUS | Status: DC
  Administered 2011-05-22 – 2011-05-24 (×2): 1000 mg via INTRAVENOUS
  Filled 2011-05-22 (×2): qty 200

## 2011-05-22 MED ORDER — MUPIROCIN 2 % EX OINT
1.0000 "application " | TOPICAL_OINTMENT | Freq: Two times a day (BID) | CUTANEOUS | Status: AC
  Administered 2011-05-22 – 2011-05-26 (×10): 1 via NASAL
  Filled 2011-05-22: qty 22

## 2011-05-22 MED ORDER — PARICALCITOL 5 MCG/ML IV SOLN
1.0000 ug | INTRAVENOUS | Status: DC
  Administered 2011-05-24 – 2011-05-26 (×2): 1 ug via INTRAVENOUS
  Filled 2011-05-22 (×2): qty 0.2

## 2011-05-22 MED ORDER — SODIUM CHLORIDE 0.9 % IV SOLN
125.0000 mg | INTRAVENOUS | Status: DC
  Administered 2011-05-24 – 2011-05-26 (×2): 125 mg via INTRAVENOUS
  Filled 2011-05-22 (×5): qty 10

## 2011-05-22 MED ORDER — DARBEPOETIN ALFA-POLYSORBATE 200 MCG/0.4ML IJ SOLN
200.0000 ug | INTRAMUSCULAR | Status: DC
  Administered 2011-05-22: 200 ug via INTRAVENOUS
  Filled 2011-05-22: qty 0.4

## 2011-05-22 MED ORDER — GLUCOSE 40 % PO GEL
ORAL | Status: AC
  Filled 2011-05-22: qty 1

## 2011-05-22 NOTE — Progress Notes (Signed)
ANTIBIOTIC CONSULT NOTE - FOLLOW UP  Pharmacy Consult for Vancomycin Indication: Staph Aureus Bacteremia (pending sensitivities)  Allergies  Allergen Reactions  . Ace Inhibitors   . Enalapril   . Lisinopril     REACTION: unspecified  . Omnipaque (Iohexol)     Patient Measurements: Height: 5' (152.4 cm) Weight: 209 lb 3.5 oz (94.9 kg) IBW/kg (Calculated) : 45.5   Vital Signs: Temp: 98 F (36.7 C) (05/13 1341) Temp src: Oral (05/13 1341) BP: 118/55 mmHg (05/13 1600) Pulse Rate: 69  (05/13 1600) Intake/Output from previous day: 05/12 0701 - 05/13 0700 In: 752 [P.O.:600; IV Piggyback:152] Out: -  Intake/Output from this shift: Total I/O In: 360 [P.O.:360] Out: -   Labs:  Basename 05/22/11 0523 05/21/11 0715 05/20/11 2016  WBC 13.3* 17.9* 19.3*  HGB 7.7* 7.3* 7.9*  PLT 321 350 401*  LABCREA -- -- --  CREATININE 5.55* 5.07* 4.33*   Estimated Creatinine Clearance: 10.4 ml/min (by C-G formula based on Cr of 5.55). No results found for this basename: VANCOTROUGH:2,VANCOPEAK:2,VANCORANDOM:2,GENTTROUGH:2,GENTPEAK:2,GENTRANDOM:2,TOBRATROUGH:2,TOBRAPEAK:2,TOBRARND:2,AMIKACINPEAK:2,AMIKACINTROU:2,AMIKACIN:2, in the last 72 hours   Microbiology: Recent Results (from the past 720 hour(s))  CULTURE, BLOOD (ROUTINE X 2)     Status: Normal (Preliminary result)   Collection Time   05/20/11  8:39 PM      Component Value Range Status Comment   Specimen Description BLOOD RIGHT HAND   Final    Special Requests BOTTLES DRAWN AEROBIC AND ANAEROBIC 10CC EA   Final    Culture  Setup Time [id number]   Final    Culture     Final    Value: STAPHYLOCOCCUS AUREUS     Note: Gram Stain Report Called to,Read Back By and Verified With: RN VICKY M. ON 05/21/11 AT 1930 BY TEDAR   Report Status PENDING   Incomplete   CULTURE, BLOOD (ROUTINE X 2)     Status: Normal (Preliminary result)   Collection Time   05/20/11  8:46 PM      Component Value Range Status Comment   Specimen Description BLOOD  RIGHT ARM   Final    Special Requests BOTTLES DRAWN AEROBIC ONLY 8CC   Final    Culture  Setup Time [id number]   Final    Culture     Final    Value: STAPHYLOCOCCUS AUREUS     Note: RIFAMPIN AND GENTAMICIN SHOULD NOT BE USED AS SINGLE DRUGS FOR TREATMENT OF STAPH INFECTIONS.     Note: Gram Stain Report Called to,Read Back By and Verified With: RN VICKY M. ON 05/21/11 AT 1930 BY TEDAR   Report Status PENDING   Incomplete   URINE CULTURE     Status: Normal   Collection Time   05/20/11 10:19 PM      Component Value Range Status Comment   Specimen Description URINE, RANDOM   Final    Special Requests NONE   Final    Culture  Setup Time [id number]   Final    Colony Count >=100,000 COLONIES/ML   Final    Culture     Final    Value: Multiple bacterial morphotypes present, none predominant. Suggest appropriate recollection if clinically indicated.   Report Status 05/22/2011 FINAL   Final   URINE CULTURE     Status: Normal   Collection Time   05/21/11  2:46 AM      Component Value Range Status Comment   Specimen Description URINE, RANDOM   Final    Special Requests NONE   Final  Culture  Setup Time [id number]   Final    Colony Count >=100,000 COLONIES/ML   Final    Culture     Final    Value: Multiple bacterial morphotypes present, none predominant. Suggest appropriate recollection if clinically indicated.   Report Status 05/22/2011 FINAL   Final   MRSA PCR SCREENING     Status: Abnormal   Collection Time   05/21/11  3:02 AM      Component Value Range Status Comment   MRSA by PCR POSITIVE (*) NEGATIVE  Final     Anti-infectives     Start     Dose/Rate Route Frequency Ordered Stop   05/21/11 2200   cefTRIAXone (ROCEPHIN) 1 g in dextrose 5 % 50 mL IVPB  Status:  Discontinued        1 g 100 mL/hr over 30 Minutes Intravenous Every 24 hours 05/21/11 0146 05/21/11 1037   05/21/11 2100   vancomycin (VANCOCIN) 2,000 mg in sodium chloride 0.9 % 500 mL IVPB        2,000 mg 250 mL/hr  over 120 Minutes Intravenous  Once 05/21/11 1954 05/21/11 2312   05/21/11 1200  piperacillin-tazobactam (ZOSYN) IVPB 2.25 g       2.25 g 100 mL/hr over 30 Minutes Intravenous 3 times per day 05/21/11 1102     05/21/11 1000   terbinafine (LAMISIL) tablet 250 mg        250 mg Oral Daily 05/21/11 0146     05/21/11 0000   cefTRIAXone (ROCEPHIN) 1 g in dextrose 5 % 50 mL IVPB        1 g 100 mL/hr over 30 Minutes Intravenous  Once 05/20/11 2353 05/21/11 0033          Assessment: 66 y.o. F on Vancomycin for Staph Aureus Bacteremia (pending sensitivities). The patient was properly loaded with Vancomycin on 5/11 and is now to resume on her normal HD schedule of MWF. Wt: 94.9 kg, Afebrile, WBC 13.3 << 17.9  Goal of Therapy:  Pre-HD Vancomycin level of 15-25 mcg/ml  Plan:  1. Vancomycin 1g IV post HD sessions 2. Will continue to follow HD schedule/duration, culture results, LOT, and antibiotic de-escalation plans   Georgina Pillion, PharmD, BCPS Clinical Pharmacist Pager: [phone] 05/22/2011 4:27 PM

## 2011-05-22 NOTE — Progress Notes (Addendum)
Subjective: Patient seen and examined this morning. Was noted to be hypoglycemic this morning and given D50. Asymptomatic and well oriented today.   Objective:  Vital signs in last 24 hours:  Filed Vitals:   05/22/11 1000 05/22/11 1341 05/22/11 1353 05/22/11 1358  BP: 138/64 104/50 100/36 121/49  Pulse: 67 71 70 69  Temp: 98 F (36.7 C) 98 F (36.7 C)    TempSrc: Oral Oral    Resp: 20 21 20 19   Height:      Weight:  94.9 kg (209 lb 3.5 oz)    SpO2: 95% 98%      Intake/Output from previous day:   Intake/Output Summary (Last 24 hours) at 05/22/11 1412 Last data filed at 05/22/11 0900  Gross per 24 hour  Intake    392 ml  Output      0 ml  Net    392 ml    Physical Exam:   General: elderly female in no acute distress.  HEENT: no pallor, no icterus, moist oral mucosa, no JVD, no lymphadenopathy  Heart: Normal s1 &s2 Regular rate and rhythm, without murmurs, rubs, gallops.  Lungs: Clear to auscultation bilaterally.  Abdomen: colostomy bag in place, Soft, nontender, nondistended, positive bowel sounds.  Extremities: No clubbing cyanosis or edema with positive pedal pulses.  Neuro: Alert, awake, oriented x3   Lab Results:  Basic Metabolic Panel:    Component Value Date/Time   NA 138 05/22/2011 0523   K 3.9 05/22/2011 0523   CL 96 05/22/2011 0523   CO2 20 05/22/2011 0523   BUN 35* 05/22/2011 0523   CREATININE 5.55* 05/22/2011 0523   GLUCOSE <20* 05/22/2011 0523   CALCIUM 8.3* 05/22/2011 0523   CALCIUM 7.5* 10/14/2008 1049   CBC:    Component Value Date/Time   WBC 13.3* 05/22/2011 0523   HGB 7.7* 05/22/2011 0523   HCT 27.4* 05/22/2011 0523   PLT 321 05/22/2011 0523   MCV 80.8 05/22/2011 0523   NEUTROABS 17.4* 05/20/2011 2016   LYMPHSABS 0.7 05/20/2011 2016   MONOABS 1.1* 05/20/2011 2016   EOSABS 0.0 05/20/2011 2016   BASOSABS 0.0 05/20/2011 2016    Recent Results (from the past 240 hour(s))  CULTURE, BLOOD (ROUTINE X 2)     Status: Normal (Preliminary result)   Collection Time   05/20/11  8:39 PM      Component Value Range Status Comment   Specimen Description BLOOD RIGHT HAND   Final    Special Requests BOTTLES DRAWN AEROBIC AND ANAEROBIC 10CC EA   Final    Culture  Setup Time [id number]   Final    Culture     Final    Value: STAPHYLOCOCCUS AUREUS     Note: Gram Stain Report Called to,Read Back By and Verified With: RN VICKY M. ON 05/21/11 AT 1930 BY TEDAR   Report Status PENDING   Incomplete   CULTURE, BLOOD (ROUTINE X 2)     Status: Normal (Preliminary result)   Collection Time   05/20/11  8:46 PM      Component Value Range Status Comment   Specimen Description BLOOD RIGHT ARM   Final    Special Requests BOTTLES DRAWN AEROBIC ONLY 8CC   Final    Culture  Setup Time [id number]   Final    Culture     Final    Value: STAPHYLOCOCCUS AUREUS     Note: RIFAMPIN AND GENTAMICIN SHOULD NOT BE USED AS SINGLE DRUGS FOR TREATMENT OF STAPH INFECTIONS.  Note: Gram Stain Report Called to,Read Back By and Verified With: RN VICKY M. ON 05/21/11 AT 1930 BY TEDAR   Report Status PENDING   Incomplete   URINE CULTURE     Status: Normal   Collection Time   05/20/11 10:19 PM      Component Value Range Status Comment   Specimen Description URINE, RANDOM   Final    Special Requests NONE   Final    Culture  Setup Time [id number]   Final    Colony Count >=100,000 COLONIES/ML   Final    Culture     Final    Value: Multiple bacterial morphotypes present, none predominant. Suggest appropriate recollection if clinically indicated.   Report Status 05/22/2011 FINAL   Final   URINE CULTURE     Status: Normal   Collection Time   05/21/11  2:46 AM      Component Value Range Status Comment   Specimen Description URINE, RANDOM   Final    Special Requests NONE   Final    Culture  Setup Time [id number]   Final    Colony Count >=100,000 COLONIES/ML   Final    Culture     Final    Value: Multiple bacterial morphotypes present, none predominant. Suggest appropriate  recollection if clinically indicated.   Report Status 05/22/2011 FINAL   Final   MRSA PCR SCREENING     Status: Abnormal   Collection Time   05/21/11  3:02 AM      Component Value Range Status Comment   MRSA by PCR POSITIVE (*) NEGATIVE  Final     Studies/Results: Ct Abdomen Pelvis Wo Contrast  05/21/2011  *RADIOLOGY REPORT*  Clinical Data: Weight loss.  Nausea and vomiting.  The  CT ABDOMEN AND PELVIS WITHOUT CONTRAST  Technique:  Multidetector CT imaging of the abdomen and pelvis was performed following the standard protocol without intravenous contrast.  Comparison: 10/28/2009  Findings: Atelectasis in the lung bases.  Layering of multiple stones in the gallbladder, similar previous study.  Low attenuation lesion in the spleen is stable since the previous study.  There is a large left anterior abdominal wall hernia arising between the rectus abdominous and flank muscles. The hernia contains colon, small bowel, and fat and appears to represent a peristomal hernia.  This appears stable since the previous study.  There is a midline abdominal hernia below the level in the emboli this which appears represent a peristomal hernia arising from an ileal conduit.  Infiltration or edema in the subcutaneous fat.  The unenhanced liver is unremarkable.  Postoperative changes with bladder resection and right lower quadrant ileal conduit.  The left ureter is distended with.  Renal stranding on the left suggesting obstruction.  No significant obstruction of the right kidney. Descending colostomy in the left lower quadrant.  Calcification of the abdominal aorta without aneurysm.  Pelvis:  No free or loculated pelvic fluid collections.  No significant pelvic lymphadenopathy.  Degenerative changes in the lumbar spine.  IMPRESSION: No other postoperative changes with resection of the bladder and right lower quadrant ileal conduit.  Suggestion of left ureteral obstruction.  Left lower quadrant colostomy.  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  No evidence of bowel obstruction.  Cholelithiasis.  Stable splenic lesion.  Original Report Authenticated By: Marlon Pel, M.D.   Dg Chest 2 View  05/20/2011  *RADIOLOGY REPORT*  Clinical Data: Vomiting and altered mental status.  CHEST - 2 VIEW  Comparison: 10/05/2010  Findings: Shallow inspiration.  Cardiac enlargement with normal pulmonary vascularity for technique.  No focal airspace consolidation in the lungs.  No blunting of costophrenic angles. No pneumothorax.  Degenerative changes in the spine.  No significant change since previous study.  Motion artifact.  IMPRESSION: Cardiac enlargement without vascular congestion or edema.  Original Report Authenticated By: Marlon Pel, M.D.   Ct Head Wo Contrast  05/20/2011  *RADIOLOGY REPORT*  Clinical Data: Altered mental status and vomiting after eating. Diabetes.  End-stage renal disease.  CT HEAD WITHOUT CONTRAST  Technique:  Contiguous axial images were obtained from the base of the skull through the vertex without contrast.  Comparison: MRI 01/09/2010.  CT 01/08/2010  Findings: Technically limited study due to motion artifact.  There is diffuse cerebral atrophy.  Low attenuation changes throughout the deep and periventricular white matter consistent with small vessel ischemia.  Previously identified left thalamic infarct is less well visualized today.  Suggestion of some low attenuation change in the pons which might represent small vessel ischemia. Similar changes were present on the previous MRI.  No mass effect or midline shift.  No abnormal extra-axial fluid collections.  Wallace Cullens- white matter junctions are distinct.  Basal cisterns are not effaced.  No evidence of acute intracranial hemorrhage.  No depressed skull fractures.  Visualized paranasal sinuses demonstrate retention cyst in the left maxillary antrum.  Mastoid air cells are not opacified.  Vascular calcifications.  IMPRESSION: Chronic  atrophy and small vessel ischemic changes.  No acute intracranial abnormalities.  Original Report Authenticated By: Marlon Pel, M.D.    Medications: Scheduled Meds:   . amiodarone  200 mg Oral Daily  . aspirin  81 mg Oral Daily  . calcium acetate  2,001 mg Oral TID  . Chlorhexidine Gluconate Cloth  6 each Topical Q0600  . darbepoetin (ARANESP) injection - DIALYSIS  200 mcg Intravenous Q Mon-HD  . dextrose      . dextrose      . docusate sodium  100 mg Oral BID  . ferric gluconate (FERRLECIT/NULECIT) IV  125 mg Intravenous Q M,W,F-HD  . heparin  5,000 Units Subcutaneous Q8H  . insulin aspart  0-5 Units Subcutaneous QHS  . insulin aspart  0-9 Units Subcutaneous TID WC  . insulin lispro protamine-insulin lispro  20 Units Subcutaneous Q breakfast  . isosorbide mononitrate  60 mg Oral Daily  . metoprolol  50 mg Oral BID  . mupirocin ointment  1 application Nasal BID  . paricalcitol  1 mcg Intravenous Q M,W,F-HD  . piperacillin-tazobactam (ZOSYN)  IV  2.25 g Intravenous Q8H  . simvastatin  10 mg Oral q1800  . sodium chloride  3 mL Intravenous Q12H  . sodium chloride  3 mL Intravenous Q12H  . terbinafine  250 mg Oral Daily  . vancomycin  2,000 mg Intravenous Once   Continuous Infusions:  PRN Meds:.sodium chloride, acetaminophen, acetaminophen, albuterol, guaiFENesin-dextromethorphan, HYDROcodone-acetaminophen, morphine injection, ondansetron (ZOFRAN) IV, ondansetron, sodium chloride  Assessment/  66 y/o female with multiple medical problems including CAD, DM, ESRD on HD, colon ca s/p surgery with colostomy presents with AMS likely in the setting of UTI.   Plan:  AMS with UTI (lower urinary tract infection)  Patient much oriented this am. Has hx of UTI in past with citrobacter and klebsiella in cx which was pansensitive.  patient has significant leucocytosis and low grade temp on presentation. Started on IV ceftraixone  - switched her abx to zosyn for broader coverage until cx  and sensitivity is back.  -leucocytosis well improving  ESRD (end stage renal disease) on dialysis  Will inform renal . HD days are M,W,F   DM (diabetes mellitus), type 2, uncontrolled, with renal complications  Cont home dose insulin and SSI   Coronary artery disease  Cont BB, statin and ASA   Hx of atrial tachycardia  on amiodarone   Chest pain  Likely atypical and denies any symptoms at present, monitor on tele  mild treponemia possible related to demand ischemia  Asymptomatic now . Will not follow further  Anemia  Baseline Hb around 8-9 , 7.7 at present Will monitor closely   DVT prophylaxis   Diet; renal/ diabetic   Full code      LOS: 2 days   Carmela Piechowski 05/22/2011, 2:12 PM  Blood culture from admission 1/2 growing staph aureus  . Will follow sensitivity . Added IV vanco by night cross cover

## 2011-05-22 NOTE — Consult Note (Deleted)
[facility] Renal Consultation Note  Indication for Consultation:  Management of ESRD/hemodialysis; anemia, hypertension/volume and secondary hyperparathyroidism  HPI: [name] is a 66 y.o. female with ESRD on HD on MWF at [facility] who presented to the ED on the evening of 5/11 with nausea, vomiting, and altered mental status, as well as cloudy, blood-tinged urine.  She has both colostomy and urostomy, secondary to surgery for colon cancer with bladder involvement  in 1986.  Urinalysis indicated a urinary tract infection, and IV Rocephin was started.  She denies any fever, chills, or pain and currently feels better, but has little appetite.  Dialysis Orders: Center: [facility]  on MWF . EDW 93.5 kg  HD Bath 2K/2.5Ca  Time 4 hrs  Heparin none. Access AVF @ LUA   BFR 400 DFR 800    Zemplar 1 mcg IV/HD   Epogen 28,000 Units IV/HD  Venofer 100 mg per HD   Past Medical History  Diagnosis Date  . LBBB (left bundle branch block)   . Other and unspecified hyperlipidemia   . Mitral valve insufficiency and aortic valve insufficiency   . Cardiomyopathy- mixed   . Unspecified essential hypertension   . Type II or unspecified type diabetes mellitus without mention of complication, not stated as uncontrolled   . Personal history of malignant neoplasm of large intestine   . Renal failure     ESRD, Dr Eliott Nine  . CHF (congestive heart failure)   . Stroke   . Anemia   . Cancer     history of colon cancer  . Coronary artery disease     severely decreased EF.   [name] Atrial tachycardia     on amiod   Past Surgical History  Procedure Date  . Colostomy   . Revision urostomy cutaneous   . Esophagogastroduodenoscopy 03-18-04  . Electrocardiogram 04-27-06  . Placement of new left forearm arteriovenous graft 03-11-08  . Left heart catheterization and right heart catheterization 12-10    R. heart cath showed elevated left and right heart filling pressures w/ pulmonary artery pressure elevated mildly  out of proportion to the wedge. The left heart cath showed diffuse distal vessel disease as well as a 75% stenosis in the mid circumflex w/ a 90% stenosis of the ostial first obtuse marginal. These lesions were in close proximity. there was a 60-70%mild RCA stenosis.   . Arteriovenous graft placement 2010  . 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 Cancer Sister     colon  . Hypertension Other   . Hypertension Mother   . Coronary artery disease Mother   . Hypertension Father   . Diabetes Father    Social History She denies any history of tobacco, alcohol, or illicit drugs.  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  Allergies  Allergen Reactions  . Ace Inhibitors   . Enalapril   . Lisinopril     REACTION: unspecified  . Omnipaque (Iohexol)    Prior to Admission medications   Medication Sig Start Date End Date Taking? Authorizing Provider  amiodarone (PACERONE) 200 MG tablet Tuesday, Thursday, Saturday, Sunday 05/02/11  Yes Duke Salvia, MD  aspirin 81 MG chewable tablet Chew 81 mg by mouth daily.     Yes Historical Provider, MD  calcium acetate (PHOSLO) 667 MG capsule Take 2,001 mg by mouth 3 (three) times daily. Take 2 capsules in the middle of each  meal each day   Yes Historical Provider, MD  insulin lispro protamine-insulin lispro (HUMALOG 50/50) (50-50) 100 UNIT/ML SUSP Inject 20 Units into the skin daily with breakfast.    Yes Historical Provider, MD  isosorbide mononitrate (IMDUR) 60 MG 24 hr tablet Take 60 mg by mouth daily.   Yes Historical Provider, MD  metoprolol (LOPRESSOR) 100 MG tablet Take 50 mg by mouth 2 (two) times daily. Non dialysis days only  06/01/10  Yes Georgina Quint Plotnikov, MD  simvastatin (ZOCOR) 10 MG tablet Take 10 mg by mouth daily.   Yes Historical Provider, MD  terbinafine (LAMISIL) 250 MG tablet Take 250 mg by mouth daily. Non-dialysis days only   Yes Historical  Provider, MD  Ostomy Supplies (ACTIVE LIFE CONVEX 1-PC ) POUCH MISC 1 Device by Does not apply route every 3 (three) days. 04/11/11   Tresa Garter, MD  Ostomy Supplies (NATURA DURAHESIVE MOLDABLE) WAFR 1 Wafer by Does not apply route 3 (three) times a week. 04/11/11   Tresa Garter, MD  Ostomy Supplies (SUR-FIT NATURA UROSTOMY [place]) [facility] MISC 1 Device by Does not apply route every 3 (three) days. 04/11/11   Tresa Garter, MD   Labs:  Results for orders placed during the hospital encounter of 05/20/11 (from the past 48 hour(s))  CBC     Status: Abnormal   Collection Time   05/20/11  8:16 PM      Component Value Range Comment   WBC 19.3 (*) 4.0 - 10.5 (K/uL)    RBC 3.37 (*) 3.87 - 5.11 (MIL/uL)    Hemoglobin 7.9 (*) 12.0 - 15.0 (g/dL)    HCT 78.2 (*) 95.6 - 46.0 (%)    MCV 82.2  78.0 - 100.0 (fL)    MCH 23.4 (*) 26.0 - 34.0 (pg)    MCHC 28.5 (*) 30.0 - 36.0 (g/dL)    RDW 21.3 (*) 08.6 - 15.5 (%)    Platelets 401 (*) 150 - 400 (K/uL)   DIFFERENTIAL     Status: Abnormal   Collection Time   05/20/11  8:16 PM      Component Value Range Comment   Neutrophils Relative 90 (*) 43 - 77 (%)    Neutro Abs 17.4 (*) 1.7 - 7.7 (K/uL)    Lymphocytes Relative 4 (*) 12 - 46 (%)    Lymphs Abs 0.7  0.7 - 4.0 (K/uL)    Monocytes Relative 6  3 - 12 (%)    Monocytes Absolute 1.1 (*) 0.1 - 1.0 (K/uL)    Eosinophils Relative 0  0 - 5 (%)    Eosinophils Absolute 0.0  0.0 - 0.7 (K/uL)    Basophils Relative 0  0 - 1 (%)    Basophils Absolute 0.0  0.0 - 0.1 (K/uL)   COMPREHENSIVE METABOLIC PANEL     Status: Abnormal   Collection Time   05/20/11  8:16 PM      Component Value Range Comment   Sodium 136  135 - 145 (mEq/L)    Potassium 3.0 (*) 3.5 - 5.1 (mEq/L)    Chloride 92 (*) 96 - 112 (mEq/L)    CO2 30  19 - 32 (mEq/L)    Glucose, Bld 320 (*) 70 - 99 (mg/dL)    BUN 20  6 - 23 (mg/dL)    Creatinine, Ser 5.78 (*) 0.50 - 1.10 (mg/dL)    Calcium 8.8  8.4 - 10.5 (mg/dL)    Total Protein  7.8  6.0 - 8.3 (g/dL)  Albumin 2.7 (*) 3.5 - 5.2 (g/dL)    AST 10  0 - 37 (U/L)    ALT 6  0 - 35 (U/L)    Alkaline Phosphatase 140 (*) 39 - 117 (U/L)    Total Bilirubin 0.5  0.3 - 1.2 (mg/dL)    GFR calc non Af Amer 10 (*) >90 (mL/min)    GFR calc Af Amer 11 (*) >90 (mL/min)   ETHANOL     Status: Normal   Collection Time   05/20/11  8:16 PM      Component Value Range Comment   Alcohol, Ethyl (B) <11  0 - 11 (mg/dL)   ACETAMINOPHEN LEVEL     Status: Normal   Collection Time   05/20/11  8:16 PM      Component Value Range Comment   Acetaminophen (Tylenol), Serum <15.0  10 - 30 (ug/mL)   SALICYLATE LEVEL     Status: Abnormal   Collection Time   05/20/11  8:16 PM      Component Value Range Comment   Salicylate Lvl <2.0 (*) 2.8 - 20.0 (mg/dL)   CULTURE, BLOOD (ROUTINE X 2)     Status: Normal (Preliminary result)   Collection Time   05/20/11  8:39 PM      Component Value Range Comment   Specimen Description BLOOD RIGHT HAND      Special Requests BOTTLES DRAWN AEROBIC AND ANAEROBIC 10CC EA      Culture  Setup Time [id number]      Culture        Value: STAPHYLOCOCCUS AUREUS     Note: Gram Stain Report Called to,Read Back By and Verified With: RN VICKY M. ON 05/21/11 AT 1930 BY TEDAR   Report Status PENDING     LACTIC ACID, PLASMA     Status: Normal   Collection Time   05/20/11  8:40 PM      Component Value Range Comment   Lactic Acid, Venous 1.8  0.5 - 2.2 (mmol/L)   PROCALCITONIN     Status: Normal   Collection Time   05/20/11  8:40 PM      Component Value Range Comment   Procalcitonin 8.13     CULTURE, BLOOD (ROUTINE X 2)     Status: Normal (Preliminary result)   Collection Time   05/20/11  8:46 PM      Component Value Range Comment   Specimen Description BLOOD RIGHT ARM      Special Requests BOTTLES DRAWN AEROBIC ONLY 8CC      Culture  Setup Time [id number]      Culture        Value: STAPHYLOCOCCUS AUREUS     Note: RIFAMPIN AND GENTAMICIN SHOULD NOT BE USED AS SINGLE  DRUGS FOR TREATMENT OF STAPH INFECTIONS.     Note: Gram Stain Report Called to,Read Back By and Verified With: RN VICKY M. ON 05/21/11 AT 1930 BY TEDAR   Report Status PENDING     URINALYSIS, ROUTINE W REFLEX MICROSCOPIC     Status: Abnormal   Collection Time   05/20/11 10:19 PM      Component Value Range Comment   Color, Urine RED (*) YELLOW  BIOCHEMICALS MAY BE AFFECTED BY COLOR   APPearance TURBID (*) CLEAR     Specific Gravity, Urine 1.015  1.005 - 1.030     pH 8.0  5.0 - 8.0     Glucose, UA 250 (*) NEGATIVE (mg/dL)    Hgb urine dipstick LARGE (*) NEGATIVE  Bilirubin Urine LARGE (*) NEGATIVE     Ketones, ur 15 (*) NEGATIVE (mg/dL)    Protein, ur >147 (*) NEGATIVE (mg/dL)    Urobilinogen, UA 4.0 (*) 0.0 - 1.0 (mg/dL)    Nitrite POSITIVE (*) NEGATIVE     Leukocytes, UA LARGE (*) NEGATIVE    URINE CULTURE     Status: Normal   Collection Time   05/20/11 10:19 PM      Component Value Range Comment   Specimen Description URINE, RANDOM      Special Requests NONE      Culture  Setup Time [id number]      Colony Count >=100,000 COLONIES/ML      Culture        Value: Multiple bacterial morphotypes present, none predominant. Suggest appropriate recollection if clinically indicated.   Report Status 05/22/2011 FINAL     URINE RAPID DRUG SCREEN (HOSP PERFORMED)     Status: Normal   Collection Time   05/20/11 10:19 PM      Component Value Range Comment   Opiates NONE DETECTED  NONE DETECTED     Cocaine NONE DETECTED  NONE DETECTED     Benzodiazepines NONE DETECTED  NONE DETECTED     Amphetamines NONE DETECTED  NONE DETECTED     Tetrahydrocannabinol NONE DETECTED  NONE DETECTED     Barbiturates NONE DETECTED  NONE DETECTED    URINE MICROSCOPIC-ADD ON     Status: Abnormal   Collection Time   05/20/11 10:19 PM      Component Value Range Comment   Squamous Epithelial / LPF RARE  RARE     WBC, UA TOO NUMEROUS TO COUNT  <3 (WBC/hpf)    RBC / HPF TOO NUMEROUS TO COUNT  <3 (RBC/hpf)     Bacteria, UA MANY (*) RARE     Urine-Other LESS THAN 10 mL OF URINE SUBMITTED     POCT I-STAT TROPONIN I     Status: Abnormal   Collection Time   05/20/11 10:57 PM      Component Value Range Comment   Troponin i, poc 0.14 (*) 0.00 - 0.08 (ng/mL)    Comment NOTIFIED PHYSICIAN      Comment 3            GLUCOSE, CAPILLARY     Status: Abnormal   Collection Time   05/21/11  1:44 AM      Component Value Range Comment   Glucose-Capillary 290 (*) 70 - 99 (mg/dL)   URINE CULTURE     Status: Normal   Collection Time   05/21/11  2:46 AM      Component Value Range Comment   Specimen Description URINE, RANDOM      Special Requests NONE      Culture  Setup Time [id number]      Colony Count >=100,000 COLONIES/ML      Culture        Value: Multiple bacterial morphotypes present, none predominant. Suggest appropriate recollection if clinically indicated.   Report Status 05/22/2011 FINAL     MRSA PCR SCREENING     Status: Abnormal   Collection Time   05/21/11  3:02 AM      Component Value Range Comment   MRSA by PCR POSITIVE (*) NEGATIVE    MAGNESIUM     Status: Normal   Collection Time   05/21/11  7:15 AM      Component Value Range Comment   Magnesium 2.0  1.5 - 2.5 (mg/dL)  PHOSPHORUS     Status: Normal   Collection Time   05/21/11  7:15 AM      Component Value Range Comment   Phosphorus 3.3  2.3 - 4.6 (mg/dL)   TSH     Status: Normal   Collection Time   05/21/11  7:15 AM      Component Value Range Comment   TSH 0.440  0.350 - 4.500 (uIU/mL)   COMPREHENSIVE METABOLIC PANEL     Status: Abnormal   Collection Time   05/21/11  7:15 AM      Component Value Range Comment   Sodium 137  135 - 145 (mEq/L)    Potassium 3.2 (*) 3.5 - 5.1 (mEq/L)    Chloride 93 (*) 96 - 112 (mEq/L)    CO2 29  19 - 32 (mEq/L)    Glucose, Bld 289 (*) 70 - 99 (mg/dL)    BUN 26 (*) 6 - 23 (mg/dL)    Creatinine, Ser 1.61 (*) 0.50 - 1.10 (mg/dL)    Calcium 8.9  8.4 - 10.5 (mg/dL)    Total Protein 7.3  6.0 - 8.3  (g/dL)    Albumin 2.4 (*) 3.5 - 5.2 (g/dL)    AST 10  0 - 37 (U/L)    ALT 7  0 - 35 (U/L)    Alkaline Phosphatase 124 (*) 39 - 117 (U/L)    Total Bilirubin 0.4  0.3 - 1.2 (mg/dL)    GFR calc non Af Amer 8 (*) >90 (mL/min)    GFR calc Af Amer 9 (*) >90 (mL/min)   CBC     Status: Abnormal   Collection Time   05/21/11  7:15 AM      Component Value Range Comment   WBC 17.9 (*) 4.0 - 10.5 (K/uL)    RBC 3.21 (*) 3.87 - 5.11 (MIL/uL)    Hemoglobin 7.3 (*) 12.0 - 15.0 (g/dL)    HCT 09.6 (*) 04.5 - 46.0 (%)    MCV 80.4  78.0 - 100.0 (fL)    MCH 22.7 (*) 26.0 - 34.0 (pg)    MCHC 28.3 (*) 30.0 - 36.0 (g/dL)    RDW 40.9 (*) 81.1 - 15.5 (%)    Platelets 350  150 - 400 (K/uL)   VITAMIN B12     Status: Normal   Collection Time   05/21/11  7:15 AM      Component Value Range Comment   Vitamin B-12 418  211 - 911 (pg/mL)   FOLATE     Status: Normal   Collection Time   05/21/11  7:15 AM      Component Value Range Comment   Folate 8.4     IRON AND TIBC     Status: Abnormal   Collection Time   05/21/11  7:15 AM      Component Value Range Comment   Iron 16 (*) 42 - 135 (ug/dL)    TIBC 914 (*) 782 - 470 (ug/dL)    Saturation Ratios 13 (*) 20 - 55 (%)    UIBC 106 (*) 125 - 400 (ug/dL)   FERRITIN     Status: Abnormal   Collection Time   05/21/11  7:15 AM      Component Value Range Comment   Ferritin 2174 (*) 10 - 291 (ng/mL) Result confirmed by automatic dilution.  RETICULOCYTES     Status: Abnormal   Collection Time   05/21/11  7:15 AM      Component Value Range Comment  Retic Ct Pct 1.5  0.4 - 3.1 (%)    RBC. 3.21 (*) 3.87 - 5.11 (MIL/uL)    Retic Count, Manual 48.2  19.0 - 186.0 (K/uL)   GLUCOSE, CAPILLARY     Status: Abnormal   Collection Time   05/21/11  8:03 AM      Component Value Range Comment   Glucose-Capillary 298 (*) 70 - 99 (mg/dL)   GLUCOSE, CAPILLARY     Status: Abnormal   Collection Time   05/21/11 11:59 AM      Component Value Range Comment   Glucose-Capillary 253 (*) 70  - 99 (mg/dL)   GLUCOSE, CAPILLARY     Status: Abnormal   Collection Time   05/21/11  4:40 PM      Component Value Range Comment   Glucose-Capillary 127 (*) 70 - 99 (mg/dL)    Comment 1 Notify RN     GLUCOSE, CAPILLARY     Status: Abnormal   Collection Time   05/21/11  9:20 PM      Component Value Range Comment   Glucose-Capillary 100 (*) 70 - 99 (mg/dL)    Comment 1 Documented in Chart      Comment 2 Notify RN     CBC     Status: Abnormal   Collection Time   05/22/11  5:23 AM      Component Value Range Comment   WBC 13.3 (*) 4.0 - 10.5 (K/uL)    RBC 3.39 (*) 3.87 - 5.11 (MIL/uL)    Hemoglobin 7.7 (*) 12.0 - 15.0 (g/dL)    HCT 16.1 (*) 09.6 - 46.0 (%)    MCV 80.8  78.0 - 100.0 (fL)    MCH 22.7 (*) 26.0 - 34.0 (pg)    MCHC 28.1 (*) 30.0 - 36.0 (g/dL)    RDW 04.5 (*) 40.9 - 15.5 (%)    Platelets 321  150 - 400 (K/uL)   BASIC METABOLIC PANEL     Status: Abnormal   Collection Time   05/22/11  5:23 AM      Component Value Range Comment   Sodium 138  135 - 145 (mEq/L)    Potassium 3.9  3.5 - 5.1 (mEq/L)    Chloride 96  96 - 112 (mEq/L)    CO2 20  19 - 32 (mEq/L)    Glucose, Bld <20 (*) 70 - 99 (mg/dL)    BUN 35 (*) 6 - 23 (mg/dL)    Creatinine, Ser 8.11 (*) 0.50 - 1.10 (mg/dL)    Calcium 8.3 (*) 8.4 - 10.5 (mg/dL)    GFR calc non Af Amer 7 (*) >90 (mL/min)    GFR calc Af Amer 8 (*) >90 (mL/min)   GLUCOSE, CAPILLARY     Status: Abnormal   Collection Time   05/22/11  7:46 AM      Component Value Range Comment   Glucose-Capillary 45 (*) 70 - 99 (mg/dL)   GLUCOSE, CAPILLARY     Status: Abnormal   Collection Time   05/22/11  8:13 AM      Component Value Range Comment   Glucose-Capillary 49 (*) 70 - 99 (mg/dL)   GLUCOSE, CAPILLARY     Status: Normal   Collection Time   05/22/11  8:31 AM      Component Value Range Comment   Glucose-Capillary 96  70 - 99 (mg/dL)   GLUCOSE, CAPILLARY     Status: Abnormal   Collection Time   05/22/11 11:57 AM  Component Value Range Comment    Glucose-Capillary 176 (*) 70 - 99 (mg/dL)    Constitutional: negative for chills, fatigue, fevers and sweats Respiratory: negative for cough, dyspnea on exertion and wheezing Cardiovascular: negative for chest pain, dyspnea, orthopnea and palpitations Gastrointestinal: positive for nausea and vomiting Genitourinary:oliguric Musculoskeletal:negative for arthralgias, muscle weakness and myalgias Neurological: negative for dizziness, gait problems, headaches and weakness  Physical Exam: Filed Vitals:   05/22/11 1630  BP: 127/65  Pulse: 88  Temp:   Resp: 21     General appearance: alert, cooperative and no distress Head: Normocephalic, without obvious abnormality, atraumatic Neck: no adenopathy, no carotid bruit, no JVD and supple, symmetrical, trachea midline Resp: clear to auscultation bilaterally Cardio: regular rate and rhythm, S1, S2 normal, no murmur, click, rub or gallop GI: soft, non-tender; bowel sounds normal; no masses,  no organomegaly Extremities: extremities normal, atraumatic, no cyanosis or edema Neurologic: Grossly normal Dialysis Access: AVF @ LUA with + bruit   Assessment/Plan: 1. N & V/AMS - likely secondary to UTI per UA, improving on IV Rocephin, urine culture pending; given history of abdominal surgery, CT of abdomen also pending. 2. ESRD -  HD on MWF @ [place], K 3.9 today.  HD pending. 3. Hypertension/volume  - BP stable on Metoprolol 50 mg bid; pre-HD wt 94.9 with EDW 93.5 kg.  UF goal of 1.4 L. 4. Anemia  - Hgb 7.7 today, on max Epogen and Fe per Hd.  Give Aranesp 200 mcg today and continue Fe per HD. 5. Metabolic bone disease -  Ca 8.3, last P 3.6 on 4/24, on Zemplar 1 mcg per Hd, phoslo 3 with meals. 6. Nutrition - Last Alb 3.3. 7. 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 8. DM - HgbA1C 6.6 on 4/24, on insulin. 9. History of atrial tachycardia - on amiodarone , BB,  ASA.  Gerome Apley, [place] 05/22/2011, 3:14 PM  Patient seen and examined and agree with assessment and plan as above.  Vinson Moselle  MD [employer] [phone] pgr    [phone] cell 05/22/2011, 5:08 PM

## 2011-05-22 NOTE — Consult Note (Signed)
[facility] Renal Consultation Note  Indication for Consultation:  Management of ESRD/hemodialysis; anemia, hypertension/volume and secondary hyperparathyroidism  HPI: [name] is a 66 y.o. female with ESRD on HD on MWF at [facility] who presented to the ED on the evening of 5/11 with nausea, vomiting, and altered mental status, as well as cloudy, blood-tinged urine.  She has both colostomy and urostomy, secondary to surgery for colon cancer with bladder involvement  in 1986.  Urinalysis indicated a urinary tract infection, and IV Rocephin was started.  She denies any fever, chills, or pain and currently feels better, but has little appetite.  Dialysis Orders: Center: [facility]  on MWF . EDW 93.5 kg  HD Bath 2K/2.5Ca  Time 4 hrs  Heparin none. Access AVF @ LUA   BFR 400 DFR 800    Zemplar 1 mcg IV/HD   Epogen 28,000 Units IV/HD  Venofer 100 mg per HD   Past Medical History  Diagnosis Date  . LBBB (left bundle branch block)   . Other and unspecified hyperlipidemia   . Mitral valve insufficiency and aortic valve insufficiency   . Cardiomyopathy- mixed   . Unspecified essential hypertension   . Type II or unspecified type diabetes mellitus without mention of complication, not stated as uncontrolled   . Personal history of malignant neoplasm of large intestine   . Renal failure     ESRD, Dr Eliott Nine  . CHF (congestive heart failure)   . Stroke   . Anemia   . Cancer     history of colon cancer  . Coronary artery disease     severely decreased EF.   [name] Atrial tachycardia     on amiod   Past Surgical History  Procedure Date  . Colostomy   . Revision urostomy cutaneous   . Esophagogastroduodenoscopy 03-18-04  . Electrocardiogram 04-27-06  . Placement of new left forearm arteriovenous graft 03-11-08  . Left heart catheterization and right heart catheterization 12-10    R. heart cath showed elevated left and right heart filling pressures w/ pulmonary artery pressure elevated mildly  out of proportion to the wedge. The left heart cath showed diffuse distal vessel disease as well as a 75% stenosis in the mid circumflex w/ a 90% stenosis of the ostial first obtuse marginal. These lesions were in close proximity. there was a 60-70%mild RCA stenosis.   . Arteriovenous graft placement 2010  . 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 Cancer Sister     colon  . Hypertension Other   . Hypertension Mother   . Coronary artery disease Mother   . Hypertension Father   . Diabetes Father    Social History She denies any history of tobacco, alcohol, or illicit drugs.  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  Allergies  Allergen Reactions  . Ace Inhibitors   . Enalapril   . Lisinopril     REACTION: unspecified  . Omnipaque (Iohexol)    Prior to Admission medications   Medication Sig Start Date End Date Taking? Authorizing Provider  amiodarone (PACERONE) 200 MG tablet Tuesday, Thursday, Saturday, Sunday 05/02/11  Yes Duke Salvia, MD  aspirin 81 MG chewable tablet Chew 81 mg by mouth daily.     Yes Historical Provider, MD  calcium acetate (PHOSLO) 667 MG capsule Take 2,001 mg by mouth 3 (three) times daily. Take 2 capsules in the middle of each  meal each day   Yes Historical Provider, MD  insulin lispro protamine-insulin lispro (HUMALOG 50/50) (50-50) 100 UNIT/ML SUSP Inject 20 Units into the skin daily with breakfast.    Yes Historical Provider, MD  isosorbide mononitrate (IMDUR) 60 MG 24 hr tablet Take 60 mg by mouth daily.   Yes Historical Provider, MD  metoprolol (LOPRESSOR) 100 MG tablet Take 50 mg by mouth 2 (two) times daily. Non dialysis days only  06/01/10  Yes Georgina Quint Plotnikov, MD  simvastatin (ZOCOR) 10 MG tablet Take 10 mg by mouth daily.   Yes Historical Provider, MD  terbinafine (LAMISIL) 250 MG tablet Take 250 mg by mouth daily. Non-dialysis days only   Yes Historical  Provider, MD  Ostomy Supplies (ACTIVE LIFE CONVEX 1-PC ) POUCH MISC 1 Device by Does not apply route every 3 (three) days. 04/11/11   Tresa Garter, MD  Ostomy Supplies (NATURA DURAHESIVE MOLDABLE) WAFR 1 Wafer by Does not apply route 3 (three) times a week. 04/11/11   Tresa Garter, MD  Ostomy Supplies (SUR-FIT NATURA UROSTOMY [place]) [facility] MISC 1 Device by Does not apply route every 3 (three) days. 04/11/11   Tresa Garter, MD   Labs:  Results for orders placed during the hospital encounter of 05/20/11 (from the past 48 hour(s))  CBC     Status: Abnormal   Collection Time   05/20/11  8:16 PM      Component Value Range Comment   WBC 19.3 (*) 4.0 - 10.5 (K/uL)    RBC 3.37 (*) 3.87 - 5.11 (MIL/uL)    Hemoglobin 7.9 (*) 12.0 - 15.0 (g/dL)    HCT 04.5 (*) 40.9 - 46.0 (%)    MCV 82.2  78.0 - 100.0 (fL)    MCH 23.4 (*) 26.0 - 34.0 (pg)    MCHC 28.5 (*) 30.0 - 36.0 (g/dL)    RDW 81.1 (*) 91.4 - 15.5 (%)    Platelets 401 (*) 150 - 400 (K/uL)   DIFFERENTIAL     Status: Abnormal   Collection Time   05/20/11  8:16 PM      Component Value Range Comment   Neutrophils Relative 90 (*) 43 - 77 (%)    Neutro Abs 17.4 (*) 1.7 - 7.7 (K/uL)    Lymphocytes Relative 4 (*) 12 - 46 (%)    Lymphs Abs 0.7  0.7 - 4.0 (K/uL)    Monocytes Relative 6  3 - 12 (%)    Monocytes Absolute 1.1 (*) 0.1 - 1.0 (K/uL)    Eosinophils Relative 0  0 - 5 (%)    Eosinophils Absolute 0.0  0.0 - 0.7 (K/uL)    Basophils Relative 0  0 - 1 (%)    Basophils Absolute 0.0  0.0 - 0.1 (K/uL)   COMPREHENSIVE METABOLIC PANEL     Status: Abnormal   Collection Time   05/20/11  8:16 PM      Component Value Range Comment   Sodium 136  135 - 145 (mEq/L)    Potassium 3.0 (*) 3.5 - 5.1 (mEq/L)    Chloride 92 (*) 96 - 112 (mEq/L)    CO2 30  19 - 32 (mEq/L)    Glucose, Bld 320 (*) 70 - 99 (mg/dL)    BUN 20  6 - 23 (mg/dL)    Creatinine, Ser 7.82 (*) 0.50 - 1.10 (mg/dL)    Calcium 8.8  8.4 - 10.5 (mg/dL)    Total Protein  7.8  6.0 - 8.3 (g/dL)  Albumin 2.7 (*) 3.5 - 5.2 (g/dL)    AST 10  0 - 37 (U/L)    ALT 6  0 - 35 (U/L)    Alkaline Phosphatase 140 (*) 39 - 117 (U/L)    Total Bilirubin 0.5  0.3 - 1.2 (mg/dL)    GFR calc non Af Amer 10 (*) >90 (mL/min)    GFR calc Af Amer 11 (*) >90 (mL/min)   ETHANOL     Status: Normal   Collection Time   05/20/11  8:16 PM      Component Value Range Comment   Alcohol, Ethyl (B) <11  0 - 11 (mg/dL)   ACETAMINOPHEN LEVEL     Status: Normal   Collection Time   05/20/11  8:16 PM      Component Value Range Comment   Acetaminophen (Tylenol), Serum <15.0  10 - 30 (ug/mL)   SALICYLATE LEVEL     Status: Abnormal   Collection Time   05/20/11  8:16 PM      Component Value Range Comment   Salicylate Lvl <2.0 (*) 2.8 - 20.0 (mg/dL)   CULTURE, BLOOD (ROUTINE X 2)     Status: Normal (Preliminary result)   Collection Time   05/20/11  8:39 PM      Component Value Range Comment   Specimen Description BLOOD RIGHT HAND      Special Requests BOTTLES DRAWN AEROBIC AND ANAEROBIC 10CC EA      Culture  Setup Time [id number]      Culture        Value: STAPHYLOCOCCUS AUREUS     Note: Gram Stain Report Called to,Read Back By and Verified With: RN VICKY M. ON 05/21/11 AT 1930 BY TEDAR   Report Status PENDING     LACTIC ACID, PLASMA     Status: Normal   Collection Time   05/20/11  8:40 PM      Component Value Range Comment   Lactic Acid, Venous 1.8  0.5 - 2.2 (mmol/L)   PROCALCITONIN     Status: Normal   Collection Time   05/20/11  8:40 PM      Component Value Range Comment   Procalcitonin 8.13     CULTURE, BLOOD (ROUTINE X 2)     Status: Normal (Preliminary result)   Collection Time   05/20/11  8:46 PM      Component Value Range Comment   Specimen Description BLOOD RIGHT ARM      Special Requests BOTTLES DRAWN AEROBIC ONLY 8CC      Culture  Setup Time [id number]      Culture        Value: STAPHYLOCOCCUS AUREUS     Note: RIFAMPIN AND GENTAMICIN SHOULD NOT BE USED AS SINGLE  DRUGS FOR TREATMENT OF STAPH INFECTIONS.     Note: Gram Stain Report Called to,Read Back By and Verified With: RN VICKY M. ON 05/21/11 AT 1930 BY TEDAR   Report Status PENDING     URINALYSIS, ROUTINE W REFLEX MICROSCOPIC     Status: Abnormal   Collection Time   05/20/11 10:19 PM      Component Value Range Comment   Color, Urine RED (*) YELLOW  BIOCHEMICALS MAY BE AFFECTED BY COLOR   APPearance TURBID (*) CLEAR     Specific Gravity, Urine 1.015  1.005 - 1.030     pH 8.0  5.0 - 8.0     Glucose, UA 250 (*) NEGATIVE (mg/dL)    Hgb urine dipstick LARGE (*) NEGATIVE  Bilirubin Urine LARGE (*) NEGATIVE     Ketones, ur 15 (*) NEGATIVE (mg/dL)    Protein, ur >782 (*) NEGATIVE (mg/dL)    Urobilinogen, UA 4.0 (*) 0.0 - 1.0 (mg/dL)    Nitrite POSITIVE (*) NEGATIVE     Leukocytes, UA LARGE (*) NEGATIVE    URINE CULTURE     Status: Normal   Collection Time   05/20/11 10:19 PM      Component Value Range Comment   Specimen Description URINE, RANDOM      Special Requests NONE      Culture  Setup Time [id number]      Colony Count >=100,000 COLONIES/ML      Culture        Value: Multiple bacterial morphotypes present, none predominant. Suggest appropriate recollection if clinically indicated.   Report Status 05/22/2011 FINAL     URINE RAPID DRUG SCREEN (HOSP PERFORMED)     Status: Normal   Collection Time   05/20/11 10:19 PM      Component Value Range Comment   Opiates NONE DETECTED  NONE DETECTED     Cocaine NONE DETECTED  NONE DETECTED     Benzodiazepines NONE DETECTED  NONE DETECTED     Amphetamines NONE DETECTED  NONE DETECTED     Tetrahydrocannabinol NONE DETECTED  NONE DETECTED     Barbiturates NONE DETECTED  NONE DETECTED    URINE MICROSCOPIC-ADD ON     Status: Abnormal   Collection Time   05/20/11 10:19 PM      Component Value Range Comment   Squamous Epithelial / LPF RARE  RARE     WBC, UA TOO NUMEROUS TO COUNT  <3 (WBC/hpf)    RBC / HPF TOO NUMEROUS TO COUNT  <3 (RBC/hpf)     Bacteria, UA MANY (*) RARE     Urine-Other LESS THAN 10 mL OF URINE SUBMITTED     POCT I-STAT TROPONIN I     Status: Abnormal   Collection Time   05/20/11 10:57 PM      Component Value Range Comment   Troponin i, poc 0.14 (*) 0.00 - 0.08 (ng/mL)    Comment NOTIFIED PHYSICIAN      Comment 3            GLUCOSE, CAPILLARY     Status: Abnormal   Collection Time   05/21/11  1:44 AM      Component Value Range Comment   Glucose-Capillary 290 (*) 70 - 99 (mg/dL)   URINE CULTURE     Status: Normal   Collection Time   05/21/11  2:46 AM      Component Value Range Comment   Specimen Description URINE, RANDOM      Special Requests NONE      Culture  Setup Time [id number]      Colony Count >=100,000 COLONIES/ML      Culture        Value: Multiple bacterial morphotypes present, none predominant. Suggest appropriate recollection if clinically indicated.   Report Status 05/22/2011 FINAL     MRSA PCR SCREENING     Status: Abnormal   Collection Time   05/21/11  3:02 AM      Component Value Range Comment   MRSA by PCR POSITIVE (*) NEGATIVE    MAGNESIUM     Status: Normal   Collection Time   05/21/11  7:15 AM      Component Value Range Comment   Magnesium 2.0  1.5 - 2.5 (mg/dL)  PHOSPHORUS     Status: Normal   Collection Time   05/21/11  7:15 AM      Component Value Range Comment   Phosphorus 3.3  2.3 - 4.6 (mg/dL)   TSH     Status: Normal   Collection Time   05/21/11  7:15 AM      Component Value Range Comment   TSH 0.440  0.350 - 4.500 (uIU/mL)   COMPREHENSIVE METABOLIC PANEL     Status: Abnormal   Collection Time   05/21/11  7:15 AM      Component Value Range Comment   Sodium 137  135 - 145 (mEq/L)    Potassium 3.2 (*) 3.5 - 5.1 (mEq/L)    Chloride 93 (*) 96 - 112 (mEq/L)    CO2 29  19 - 32 (mEq/L)    Glucose, Bld 289 (*) 70 - 99 (mg/dL)    BUN 26 (*) 6 - 23 (mg/dL)    Creatinine, Ser 4.54 (*) 0.50 - 1.10 (mg/dL)    Calcium 8.9  8.4 - 10.5 (mg/dL)    Total Protein 7.3  6.0 - 8.3  (g/dL)    Albumin 2.4 (*) 3.5 - 5.2 (g/dL)    AST 10  0 - 37 (U/L)    ALT 7  0 - 35 (U/L)    Alkaline Phosphatase 124 (*) 39 - 117 (U/L)    Total Bilirubin 0.4  0.3 - 1.2 (mg/dL)    GFR calc non Af Amer 8 (*) >90 (mL/min)    GFR calc Af Amer 9 (*) >90 (mL/min)   CBC     Status: Abnormal   Collection Time   05/21/11  7:15 AM      Component Value Range Comment   WBC 17.9 (*) 4.0 - 10.5 (K/uL)    RBC 3.21 (*) 3.87 - 5.11 (MIL/uL)    Hemoglobin 7.3 (*) 12.0 - 15.0 (g/dL)    HCT 09.8 (*) 11.9 - 46.0 (%)    MCV 80.4  78.0 - 100.0 (fL)    MCH 22.7 (*) 26.0 - 34.0 (pg)    MCHC 28.3 (*) 30.0 - 36.0 (g/dL)    RDW 14.7 (*) 82.9 - 15.5 (%)    Platelets 350  150 - 400 (K/uL)   VITAMIN B12     Status: Normal   Collection Time   05/21/11  7:15 AM      Component Value Range Comment   Vitamin B-12 418  211 - 911 (pg/mL)   FOLATE     Status: Normal   Collection Time   05/21/11  7:15 AM      Component Value Range Comment   Folate 8.4     IRON AND TIBC     Status: Abnormal   Collection Time   05/21/11  7:15 AM      Component Value Range Comment   Iron 16 (*) 42 - 135 (ug/dL)    TIBC 562 (*) 130 - 470 (ug/dL)    Saturation Ratios 13 (*) 20 - 55 (%)    UIBC 106 (*) 125 - 400 (ug/dL)   FERRITIN     Status: Abnormal   Collection Time   05/21/11  7:15 AM      Component Value Range Comment   Ferritin 2174 (*) 10 - 291 (ng/mL) Result confirmed by automatic dilution.  RETICULOCYTES     Status: Abnormal   Collection Time   05/21/11  7:15 AM      Component Value Range Comment  Retic Ct Pct 1.5  0.4 - 3.1 (%)    RBC. 3.21 (*) 3.87 - 5.11 (MIL/uL)    Retic Count, Manual 48.2  19.0 - 186.0 (K/uL)   GLUCOSE, CAPILLARY     Status: Abnormal   Collection Time   05/21/11  8:03 AM      Component Value Range Comment   Glucose-Capillary 298 (*) 70 - 99 (mg/dL)   GLUCOSE, CAPILLARY     Status: Abnormal   Collection Time   05/21/11 11:59 AM      Component Value Range Comment   Glucose-Capillary 253 (*) 70  - 99 (mg/dL)   GLUCOSE, CAPILLARY     Status: Abnormal   Collection Time   05/21/11  4:40 PM      Component Value Range Comment   Glucose-Capillary 127 (*) 70 - 99 (mg/dL)    Comment 1 Notify RN     GLUCOSE, CAPILLARY     Status: Abnormal   Collection Time   05/21/11  9:20 PM      Component Value Range Comment   Glucose-Capillary 100 (*) 70 - 99 (mg/dL)    Comment 1 Documented in Chart      Comment 2 Notify RN     CBC     Status: Abnormal   Collection Time   05/22/11  5:23 AM      Component Value Range Comment   WBC 13.3 (*) 4.0 - 10.5 (K/uL)    RBC 3.39 (*) 3.87 - 5.11 (MIL/uL)    Hemoglobin 7.7 (*) 12.0 - 15.0 (g/dL)    HCT 16.1 (*) 09.6 - 46.0 (%)    MCV 80.8  78.0 - 100.0 (fL)    MCH 22.7 (*) 26.0 - 34.0 (pg)    MCHC 28.1 (*) 30.0 - 36.0 (g/dL)    RDW 04.5 (*) 40.9 - 15.5 (%)    Platelets 321  150 - 400 (K/uL)   BASIC METABOLIC PANEL     Status: Abnormal   Collection Time   05/22/11  5:23 AM      Component Value Range Comment   Sodium 138  135 - 145 (mEq/L)    Potassium 3.9  3.5 - 5.1 (mEq/L)    Chloride 96  96 - 112 (mEq/L)    CO2 20  19 - 32 (mEq/L)    Glucose, Bld <20 (*) 70 - 99 (mg/dL)    BUN 35 (*) 6 - 23 (mg/dL)    Creatinine, Ser 8.11 (*) 0.50 - 1.10 (mg/dL)    Calcium 8.3 (*) 8.4 - 10.5 (mg/dL)    GFR calc non Af Amer 7 (*) >90 (mL/min)    GFR calc Af Amer 8 (*) >90 (mL/min)   GLUCOSE, CAPILLARY     Status: Abnormal   Collection Time   05/22/11  7:46 AM      Component Value Range Comment   Glucose-Capillary 45 (*) 70 - 99 (mg/dL)   GLUCOSE, CAPILLARY     Status: Abnormal   Collection Time   05/22/11  8:13 AM      Component Value Range Comment   Glucose-Capillary 49 (*) 70 - 99 (mg/dL)   GLUCOSE, CAPILLARY     Status: Normal   Collection Time   05/22/11  8:31 AM      Component Value Range Comment   Glucose-Capillary 96  70 - 99 (mg/dL)   GLUCOSE, CAPILLARY     Status: Abnormal   Collection Time   05/22/11 11:57 AM  Component Value Range Comment    Glucose-Capillary 176 (*) 70 - 99 (mg/dL)    Constitutional: negative for chills, fatigue, fevers and sweats Respiratory: negative for cough, dyspnea on exertion and wheezing Cardiovascular: negative for chest pain, dyspnea, orthopnea and palpitations Gastrointestinal: positive for nausea and vomiting Genitourinary:oliguric Musculoskeletal:negative for arthralgias, muscle weakness and myalgias Neurological: negative for dizziness, gait problems, headaches and weakness  Physical Exam: Filed Vitals:   05/22/11 1358  BP: 121/49  Pulse: 69  Temp:   Resp: 19     General appearance: alert, cooperative and no distress Head: Normocephalic, without obvious abnormality, atraumatic Neck: no adenopathy, no carotid bruit, no JVD and supple, symmetrical, trachea midline Resp: clear to auscultation bilaterally Cardio: regular rate and rhythm, S1, S2 normal, no murmur, click, rub or gallop GI: soft, non-tender; bowel sounds normal; no masses,  no organomegaly Extremities: extremities normal, atraumatic, no cyanosis or edema Neurologic: Grossly normal Dialysis Access: AVF @ LUA with + bruit   Assessment/Plan: 1. N & V/AMS - likely secondary to UTI per UA, improving on IV Rocephin, urine culture pending; given history of abdominal surgery, CT of abdomen also pending. 2. ESRD -  HD on MWF @ [place], K 3.9 today.  HD pending. 3. Hypertension/volume  - BP stable on Metoprolol 50 mg bid; pre-HD wt 94.9 with EDW 93.5 kg.  UF goal of 1.4 L. 4. Anemia  - Hgb 7.7 today, on max Epogen and Fe per Hd.  Give Aranesp 200 mcg today and continue Fe per HD. 5. Metabolic bone disease -  Ca 8.3, last P 3.6 on 4/24, on Zemplar 1 mcg per Hd, phoslo 3 with meals. 6. Nutrition - Last Alb 3.3. 7. 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 8. DM - HgbA1C 6.6 on 4/24, on insulin. 9. History of atrial tachycardia - on amiodarone , BB,  ASA.  LYLES,CHARLES 05/22/2011, 2:35 PM   Attending Nephrologist: Delano Metz, ND  Patient seen and examined and agree with assessment and plan as above.  Vinson Moselle  MD [facility] [phone] pgr    [phone] cell 05/22/2011, 5:30 PM

## 2011-05-22 NOTE — Progress Notes (Signed)
Patient's blood glucose <20; CBG 45, gave carb snack recheck CBG 49, gave 50% dextrose IV and recheck CBG 96. Spoke with Dr. Gonzella Lex, no new orders given. Will continue to monitor. Steele Berg RN

## 2011-05-23 DIAGNOSIS — A4902 Methicillin resistant Staphylococcus aureus infection, unspecified site: Secondary | ICD-10-CM

## 2011-05-23 DIAGNOSIS — N186 End stage renal disease: Secondary | ICD-10-CM

## 2011-05-23 DIAGNOSIS — N390 Urinary tract infection, site not specified: Secondary | ICD-10-CM

## 2011-05-23 DIAGNOSIS — R7881 Bacteremia: Secondary | ICD-10-CM

## 2011-05-23 DIAGNOSIS — E1165 Type 2 diabetes mellitus with hyperglycemia: Secondary | ICD-10-CM

## 2011-05-23 DIAGNOSIS — IMO0001 Reserved for inherently not codable concepts without codable children: Secondary | ICD-10-CM

## 2011-05-23 LAB — GLUCOSE, CAPILLARY: Glucose-Capillary: 223 mg/dL — ABNORMAL HIGH (ref 70–99)

## 2011-05-23 LAB — CULTURE, BLOOD (ROUTINE X 2)

## 2011-05-23 MED ORDER — ALUM & MAG HYDROXIDE-SIMETH 200-200-20 MG/5ML PO SUSP
15.0000 mL | Freq: Four times a day (QID) | ORAL | Status: AC | PRN
  Administered 2011-05-23 – 2011-05-25 (×2): 15 mL via ORAL
  Filled 2011-05-23 (×4): qty 30

## 2011-05-23 MED ORDER — ENOXAPARIN SODIUM 30 MG/0.3ML ~~LOC~~ SOLN
30.0000 mg | SUBCUTANEOUS | Status: DC
  Administered 2011-05-23 – 2011-05-26 (×4): 30 mg via SUBCUTANEOUS
  Filled 2011-05-23 (×5): qty 0.3

## 2011-05-23 NOTE — Consult Note (Signed)
Infectious Diseases Initial Consultation  Reason for Consultation:  Management of MRSA   HPI: [name] is a 66 y.o. 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  Had some bloody urine in urostomy, N/V and fever.  She had been feeling unwell over the past 1 month.  At this time, she denies fever or chills.  Feels more like her normal self.    Past Medical History  Diagnosis Date  . LBBB (left bundle branch block)   . Other and unspecified hyperlipidemia   . Mitral valve insufficiency and aortic valve insufficiency   . Cardiomyopathy- mixed   . Unspecified essential hypertension   . Type II or unspecified type diabetes mellitus without mention of complication, not stated as uncontrolled   . Personal history of malignant neoplasm of large intestine   . Renal failure     ESRD, Dr Eliott Nine  . CHF (congestive heart failure)   . Stroke   . Anemia   . Cancer     history of colon cancer  . Coronary artery disease     severely decreased EF.   [name] Atrial tachycardia     on amiod    Allergies:  Allergies  Allergen Reactions  . Ace Inhibitors   . Enalapril   . Lisinopril     REACTION: unspecified  . Omnipaque (Iohexol)     Current antibiotics:   MEDICATIONS:    . amiodarone  200 mg Oral Daily  . aspirin  81 mg Oral Daily  . calcium acetate  2,001 mg Oral TID  . Chlorhexidine Gluconate Cloth  6 each Topical Q0600  . darbepoetin (ARANESP) injection - DIALYSIS  200 mcg Intravenous Q Mon-HD  . dextrose      . docusate sodium  100 mg Oral BID  . ferric gluconate (FERRLECIT/NULECIT) IV  125 mg Intravenous Q M,W,F-HD  . heparin  5,000 Units Subcutaneous Q8H  . insulin aspart  0-5 Units Subcutaneous QHS  . insulin aspart  0-9 Units Subcutaneous TID WC  . insulin lispro protamine-insulin lispro  20 Units Subcutaneous Q breakfast  . isosorbide mononitrate  60 mg Oral Daily  . metoprolol  50 mg Oral BID  . mupirocin ointment  1 application  Nasal BID  . paricalcitol  1 mcg Intravenous Q M,W,F-HD  . simvastatin  10 mg Oral q1800  . sodium chloride  3 mL Intravenous Q12H  . sodium chloride  3 mL Intravenous Q12H  . terbinafine  250 mg Oral Daily  . vancomycin  1,000 mg Intravenous Q M,W,F-HD  . DISCONTD: piperacillin-tazobactam (ZOSYN)  IV  2.25 g Intravenous Q8H    History  Substance Use Topics  . Smoking status: Never Smoker   . Smokeless tobacco: Never Used  . Alcohol Use: No    Family History  Problem Relation Age of Onset  . Cancer Sister     colon  . Hypertension Other   . Hypertension Mother   . Coronary artery disease Mother   . Hypertension Father   . Diabetes Father     Review of Systems - Negative except as per the HPI  OBJECTIVE: Temp:  [97.5 F (36.4 C)-99.4 F (37.4 C)] 98 F (36.7 C) (05/14 0929) Pulse Rate:  [61-88] 80  (05/14 0929) Resp:  [15-23] 20  (05/14 0929) BP: (99-128)/(36-76) 99/53 mmHg (05/14 0929) SpO2:  [84 %-100 %] 90 % (05/14 0929) Weight:  [205 lb 7.5 oz (93.2 kg)-209 lb 3.5 oz (94.9  kg)] 205 lb 7.5 oz (93.2 kg) (05/13 2056) General appearance: alert, cooperative and no distress Resp: clear to auscultation bilaterally Cardio: regular rate and rhythm, S1, S2 normal and systolic murmur: holosystolic 2/6, decrescendo at 2nd left intercostal space GI: soft, non-tender; bowel sounds normal; no masses,  no organomegaly Extremities: extremities normal, atraumatic, no cyanosis or edema  LABS: Results for orders placed during the hospital encounter of 05/20/11 (from the past 48 hour(s))  GLUCOSE, CAPILLARY     Status: Abnormal   Collection Time   05/21/11 11:59 AM      Component Value Range Comment   Glucose-Capillary 253 (*) 70 - 99 (mg/dL)   GLUCOSE, CAPILLARY     Status: Abnormal   Collection Time   05/21/11  4:40 PM      Component Value Range Comment   Glucose-Capillary 127 (*) 70 - 99 (mg/dL)    Comment 1 Notify RN     GLUCOSE, CAPILLARY     Status: Abnormal   Collection  Time   05/21/11  9:20 PM      Component Value Range Comment   Glucose-Capillary 100 (*) 70 - 99 (mg/dL)    Comment 1 Documented in Chart      Comment 2 Notify RN     CBC     Status: Abnormal   Collection Time   05/22/11  5:23 AM      Component Value Range Comment   WBC 13.3 (*) 4.0 - 10.5 (K/uL)    RBC 3.39 (*) 3.87 - 5.11 (MIL/uL)    Hemoglobin 7.7 (*) 12.0 - 15.0 (g/dL)    HCT 16.1 (*) 09.6 - 46.0 (%)    MCV 80.8  78.0 - 100.0 (fL)    MCH 22.7 (*) 26.0 - 34.0 (pg)    MCHC 28.1 (*) 30.0 - 36.0 (g/dL)    RDW 04.5 (*) 40.9 - 15.5 (%)    Platelets 321  150 - 400 (K/uL)   BASIC METABOLIC PANEL     Status: Abnormal   Collection Time   05/22/11  5:23 AM      Component Value Range Comment   Sodium 138  135 - 145 (mEq/L)    Potassium 3.9  3.5 - 5.1 (mEq/L)    Chloride 96  96 - 112 (mEq/L)    CO2 20  19 - 32 (mEq/L)    Glucose, Bld <20 (*) 70 - 99 (mg/dL)    BUN 35 (*) 6 - 23 (mg/dL)    Creatinine, Ser 8.11 (*) 0.50 - 1.10 (mg/dL)    Calcium 8.3 (*) 8.4 - 10.5 (mg/dL)    GFR calc non Af Amer 7 (*) >90 (mL/min)    GFR calc Af Amer 8 (*) >90 (mL/min)   GLUCOSE, CAPILLARY     Status: Abnormal   Collection Time   05/22/11  7:46 AM      Component Value Range Comment   Glucose-Capillary 45 (*) 70 - 99 (mg/dL)   GLUCOSE, CAPILLARY     Status: Abnormal   Collection Time   05/22/11  8:13 AM      Component Value Range Comment   Glucose-Capillary 49 (*) 70 - 99 (mg/dL)   GLUCOSE, CAPILLARY     Status: Normal   Collection Time   05/22/11  8:31 AM      Component Value Range Comment   Glucose-Capillary 96  70 - 99 (mg/dL)   GLUCOSE, CAPILLARY     Status: Abnormal   Collection Time   05/22/11 11:57  AM      Component Value Range Comment   Glucose-Capillary 176 (*) 70 - 99 (mg/dL)   GLUCOSE, CAPILLARY     Status: Abnormal   Collection Time   05/22/11  6:40 PM      Component Value Range Comment   Glucose-Capillary 128 (*) 70 - 99 (mg/dL)   GLUCOSE, CAPILLARY     Status: Abnormal   Collection  Time   05/22/11  9:23 PM      Component Value Range Comment   Glucose-Capillary 151 (*) 70 - 99 (mg/dL)    Comment 1 Documented in Chart      Comment 2 Notify RN     GLUCOSE, CAPILLARY     Status: Abnormal   Collection Time   05/23/11  7:38 AM      Component Value Range Comment   Glucose-Capillary 204 (*) 70 - 99 (mg/dL)     MICRO:  IMAGING: No results found.  HISTORICAL MICRO/IMAGING  Assessment/Plan:  66 yo with ESRD and MRSA bacteremia.  1) MRSA bacteremia - I will put in for a TTE and blood cultures have been repeated today.  She is on vancomycin and because she has a fistula, I recommend 6 weeks of vancomycin from 1st negative blood culture (hopefully todays).  She may not need a TEE unless there are concerns on the TTE since she will get a long course anyway.

## 2011-05-23 NOTE — Progress Notes (Signed)
Physical Therapy Evaluation Patient Details Name: [name] MRN: [id number] DOB: 1945/03/04 Today's Date: 05/23/2011 Time: 4098-1191 PT Time Calculation (min): 33 min  PT Assessment / Plan / Recommendation Clinical Impression  66 yo female admitted with AMS/UTI, and with decreased Hgb on PT eval presents with decr functional mobility;   will benefit from acute PT services to maximize independence and safety with mobility/ amb to enable safe dc home    PT Assessment  Patient needs continued PT services    Follow Up Recommendations  Home health PT;Supervision - Intermittent    Barriers to Discharge   Must be modified independent at home    lEquipment Recommendations  3 in 1 bedside comode (unless pt already has)    Recommendations for Other Services OT consult   Frequency Min 3X/week    Precautions / Restrictions Precautions Precautions: Fall   Pertinent Vitals/Pain Ambulated on Room Air and pt's O2 sats decr to 87%; restarted O2 via [place] 2 liters per minute, and sats increased to 93%with seated rest      Mobility  Bed Mobility Bed Mobility: Supine to Sit;Sitting - Scoot to Edge of Bed Supine to Sit: 4: Min guard;With rails (without physical contact) Sitting - Scoot to Edge of Bed: 4: Min guard (without physical contact) Details for Bed Mobility Assistance: Cues for technique and safety; self monitor for activity tolerance Transfers Transfers: Sit to Stand;Stand to Sit Sit to Stand: 4: Min assist;With upper extremity assist;From bed Stand to Sit: 4: Min assist;With armrests;To chair/3-in-1 Details for Transfer Assistance: Cues for safety, hand placement, and control; Decreased control with stand to sit; Pt was quite fatigues after walking Ambulation/Gait Ambulation/Gait Assistance: 4: Min guard Ambulation Distance (Feet): 55 Feet Assistive device: Rolling walker Ambulation/Gait Assistance Details: Cues for posture, and to self-monitor for activity tolerance;  required three standing rest breaks Gait Pattern: Decreased stride length    Exercises     PT Diagnosis: Generalized weakness  PT Problem List: Decreased strength;Decreased activity tolerance;Decreased balance;Decreased mobility;Decreased knowledge of use of DME PT Treatment Interventions: DME instruction;Gait training;Stair training;Functional mobility training;Therapeutic activities;Therapeutic exercise;Balance training;Patient/family education   PT Goals Acute Rehab PT Goals PT Goal Formulation: With patient Time For Goal Achievement: 06/06/11 Potential to Achieve Goals: Good Pt will go Supine/Side to Sit: with modified independence PT Goal: Supine/Side to Sit - Progress: Goal set today Pt will go Sit to Supine/Side: with modified independence PT Goal: Sit to Supine/Side - Progress: Goal set today Pt will go Sit to Stand: with modified independence PT Goal: Sit to Stand - Progress: Goal set today Pt will go Stand to Sit: with modified independence PT Goal: Stand to Sit - Progress: Goal set today Pt will Ambulate: 51 - 150 feet;with modified independence;with least restrictive assistive device PT Goal: Ambulate - Progress: Goal set today Pt will Go Up / Down Stairs: 1-2 stairs;with modified independence;with least restrictive assistive device PT Goal: Up/Down Stairs - Progress: Goal set today Pt will Perform Home Exercise Program: Independently PT Goal: Perform Home Exercise Program - Progress: Goal set today  Visit Information  Last PT Received On: 05/23/11 Assistance Needed: +1    Subjective Data  Subjective: Agreeable to amb Patient Stated Goal: Get better and go home   Prior Functioning  Home Living Lives With: Daughter Available Help at Discharge: Family (Daughter works evenings) Type of Home: House Home Access: Stairs to enter Secretary/administrator of Steps: 2 Entrance Stairs-Rails: None Home Layout: One level Bathroom Shower/Tub: Walk-in shower (also has a  tub/shower combo) Home Adaptive Equipment: Walker - rolling;Straight cane Prior Function Level of Independence: Independent with assistive device(s) Able to Take Stairs?: Yes Comments: In her home, Ms. Mask does not use an assistive device; in community she uses a cane; after dialysisi she often uses her RW Communication Communication: No difficulties    Cognition  Overall Cognitive Status: Appears within functional limits for tasks assessed/performed Arousal/Alertness: Awake/alert Orientation Level: Oriented X4 / Intact Behavior During Session: [facility] for tasks performed    Extremity/Trunk Assessment Right Upper Extremity Assessment RUE ROM/Strength/Tone: [facility] for tasks assessed Left Upper Extremity Assessment LUE ROM/Strength/Tone: WFL for tasks assessed Right Lower Extremity Assessment RLE ROM/Strength/Tone: Deficits RLE ROM/Strength/Tone Deficits: Grossly generalized weakness, requiring min physical assist for sit to stand Left Lower Extremity Assessment LLE ROM/Strength/Tone: Deficits LLE ROM/Strength/Tone Deficits: Grossly generalized weakness, requiring min physical assist for sit to stand   Balance Balance Balance Assessed: Yes Static Standing Balance Static Standing - Balance Support: Right upper extremity supported;Left upper extremity supported;Bilateral upper extremity supported;No upper extremity supported Static Standing - Level of Assistance: 5: Stand by assistance Static Standing - Comment/# of Minutes: Stood at [employer] 3 minutes to comb hair; Noted some increased sway when pt stood without UE support  End of Session PT - End of Session Equipment Utilized During Treatment: Gait belt Activity Tolerance: Patient tolerated treatment well;Patient limited by fatigue Patient left: in chair;with call bell/phone within reach Nurse Communication: Mobility status   Van Clines Hamff 05/23/2011, 4:10 PM

## 2011-05-23 NOTE — Progress Notes (Signed)
Subjective: Patient seen and examined this morning. Feels better. Blood cx from admission growing MRSA bacteremia.   Objective:  Vital signs in last 24 hours:  Filed Vitals:   05/22/11 1841 05/22/11 2056 05/23/11 0545 05/23/11 0929  BP: 122/69 122/68 103/64 99/53  Pulse: 74 79 79 80  Temp: 98.6 F (37 C) 99.4 F (37.4 C) 98.2 F (36.8 C) 98 F (36.7 C)  TempSrc: Oral Oral Oral Oral  Resp: 22 22 22 20   Height:      Weight:  93.2 kg (205 lb 7.5 oz)    SpO2: 100% 100% 84% 90%    Intake/Output from previous day:   Intake/Output Summary (Last 24 hours) at 05/23/11 1145 Last data filed at 05/23/11 0931  Gross per 24 hour  Intake    492 ml  Output   1500 ml  Net  -1008 ml    Physical Exam:  General: elderly female in no acute distress.  HEENT: no pallor, no icterus, moist oral mucosa, no JVD, no lymphadenopathy  Heart: Normal s1 &s2 Regular rate and rhythm, without murmurs, rubs, gallops.  Lungs: Clear to auscultation bilaterally.  Abdomen: colostomy bag in place, Soft, nontender, nondistended, positive bowel sounds.  Extremities: No clubbing cyanosis or edema with positive pedal pulses.  Neuro: Alert, awake, oriented x3     Lab Results:  Basic Metabolic Panel:    Component Value Date/Time   NA 138 05/22/2011 0523   K 3.9 05/22/2011 0523   CL 96 05/22/2011 0523   CO2 20 05/22/2011 0523   BUN 35* 05/22/2011 0523   CREATININE 5.55* 05/22/2011 0523   GLUCOSE <20* 05/22/2011 0523   CALCIUM 8.3* 05/22/2011 0523   CALCIUM 7.5* 10/14/2008 1049   CBC:    Component Value Date/Time   WBC 13.3* 05/22/2011 0523   HGB 7.7* 05/22/2011 0523   HCT 27.4* 05/22/2011 0523   PLT 321 05/22/2011 0523   MCV 80.8 05/22/2011 0523   NEUTROABS 17.4* 05/20/2011 2016   LYMPHSABS 0.7 05/20/2011 2016   MONOABS 1.1* 05/20/2011 2016   EOSABS 0.0 05/20/2011 2016   BASOSABS 0.0 05/20/2011 2016    Recent Results (from the past 240 hour(s))  CULTURE, BLOOD (ROUTINE X 2)     Status: Normal   Collection  Time   05/20/11  8:39 PM      Component Value Range Status Comment   Specimen Description BLOOD RIGHT HAND   Final    Special Requests BOTTLES DRAWN AEROBIC AND ANAEROBIC 10CC EA   Final    Culture  Setup Time [id number]   Final    Culture     Final    Value: STAPHYLOCOCCUS AUREUS     Note: SUSCEPTIBILITIES PERFORMED ON PREVIOUS CULTURE WITHIN THE LAST 5 DAYS.     Note: Gram Stain Report Called to,Read Back By and Verified With: RN VICKY M. ON 05/21/11 AT 1930 BY TEDAR   Report Status 05/23/2011 FINAL   Final   CULTURE, BLOOD (ROUTINE X 2)     Status: Normal   Collection Time   05/20/11  8:46 PM      Component Value Range Status Comment   Specimen Description BLOOD RIGHT ARM   Final    Special Requests BOTTLES DRAWN AEROBIC ONLY 8CC   Final    Culture  Setup Time [id number]   Final    Culture     Final    Value: METHICILLIN RESISTANT STAPHYLOCOCCUS AUREUS     Note: RIFAMPIN AND GENTAMICIN SHOULD NOT BE  USED AS SINGLE DRUGS FOR TREATMENT OF STAPH INFECTIONS. CRITICAL RESULT CALLED TO, READ BACK BY AND VERIFIED WITH: NANCY BINDHAMMER 05/23/11 0750 BY SMITHERSJ     Note: Gram Stain Report Called to,Read Back By and Verified With: RN VICKY M. ON 05/21/11 AT 1930 BY TEDAR   Report Status 05/23/2011 FINAL   Final    Organism ID, Bacteria METHICILLIN RESISTANT STAPHYLOCOCCUS AUREUS   Final   URINE CULTURE     Status: Normal   Collection Time   05/20/11 10:19 PM      Component Value Range Status Comment   Specimen Description URINE, RANDOM   Final    Special Requests NONE   Final    Culture  Setup Time [id number]   Final    Colony Count >=100,000 COLONIES/ML   Final    Culture     Final    Value: Multiple bacterial morphotypes present, none predominant. Suggest appropriate recollection if clinically indicated.   Report Status 05/22/2011 FINAL   Final   URINE CULTURE     Status: Normal   Collection Time   05/21/11  2:46 AM      Component Value Range Status Comment   Specimen Description  URINE, RANDOM   Final    Special Requests NONE   Final    Culture  Setup Time [id number]   Final    Colony Count >=100,000 COLONIES/ML   Final    Culture     Final    Value: Multiple bacterial morphotypes present, none predominant. Suggest appropriate recollection if clinically indicated.   Report Status 05/22/2011 FINAL   Final   MRSA PCR SCREENING     Status: Abnormal   Collection Time   05/21/11  3:02 AM      Component Value Range Status Comment   MRSA by PCR POSITIVE (*) NEGATIVE  Final     Studies/Results: No results found.  Medications: Scheduled Meds:   . amiodarone  200 mg Oral Daily  . aspirin  81 mg Oral Daily  . calcium acetate  2,001 mg Oral TID  . Chlorhexidine Gluconate Cloth  6 each Topical Q0600  . darbepoetin (ARANESP) injection - DIALYSIS  200 mcg Intravenous Q Mon-HD  . dextrose      . docusate sodium  100 mg Oral BID  . enoxaparin (LOVENOX) injection  30 mg Subcutaneous Q24H  . ferric gluconate (FERRLECIT/NULECIT) IV  125 mg Intravenous Q M,W,F-HD  . insulin aspart  0-5 Units Subcutaneous QHS  . insulin aspart  0-9 Units Subcutaneous TID WC  . insulin lispro protamine-insulin lispro  20 Units Subcutaneous Q breakfast  . isosorbide mononitrate  60 mg Oral Daily  . metoprolol  50 mg Oral BID  . mupirocin ointment  1 application Nasal BID  . paricalcitol  1 mcg Intravenous Q M,W,F-HD  . simvastatin  10 mg Oral q1800  . sodium chloride  3 mL Intravenous Q12H  . sodium chloride  3 mL Intravenous Q12H  . terbinafine  250 mg Oral Daily  . vancomycin  1,000 mg Intravenous Q M,W,F-HD  . DISCONTD: heparin  5,000 Units Subcutaneous Q8H  . DISCONTD: piperacillin-tazobactam (ZOSYN)  IV  2.25 g Intravenous Q8H   Continuous Infusions:  PRN Meds:.sodium chloride, acetaminophen, acetaminophen, albuterol, alum & mag hydroxide-simeth, guaiFENesin-dextromethorphan, HYDROcodone-acetaminophen, morphine injection, ondansetron (ZOFRAN) IV, ondansetron, sodium  chloride  Assessment/  66 y/o female with multiple medical problems including CAD, DM, ESRD on HD, colon ca s/p surgery with colostomy presents with AMS in the  setting of MRSA bacteremia.  Plan:  AMS with MRSA bacteremia Patient much oriented for past 2 days and back to baseline. Blood cx on admission ( both sets) growing MRSA, seen by Dr Luciana Axe today and recommends to continue IV vanco ( was started on 5/13) and getting TTE and repeat blood cx today.  -ID recommends 6 weeks of vancomycin from 1st negative blood culture , repeat cx ordered today. recommends 2D echo. No need for  TEE unless 2D echo concerning. -leucocytosis well improving   ? UTI - patient Has hx of UTI in past with citrobacter and klebsiella in cx which was pansensitive. This admission was initially thought to be due to UTI and started on ceftriaxone which was broadened to zosyn. cx  patient has significant leucocytosis and low grade temp on presentation. Started on IV ceftraixone and  switched her abx to zosyn for broader coverage . Urine cx showed mixed organism  and likely a contamination. Did not have any dysuria. Will d/c zosyn   ESRD (end stage renal disease) on dialysis   HD days are M,W,F , renal following  DM (diabetes mellitus), type 2, uncontrolled, with renal complications  Patient noted to be hypoglycemic on 5/13 and given D50. fsg now stable Cont home dose insulin and SSI   Coronary artery disease  Cont BB, statin and ASA   Hx of atrial tachycardia  on amiodarone   Chest pain  Likely atypical and denies any symptoms at present, monitor on tele  mild treponemia possible related to demand ischemia  Asymptomatic now . Will not follow further   Anemia  Baseline Hb around 8-9 , 7.7 at present   monitor closely   DVT prophylaxis  Sq lovenox  Diet; renal/ diabetic   Full code   Consult ; Dr Luciana Axe  Pending issues  2D echo.  Follow up blood cx sent today  dispo home possibly in next 48 hrs  following current blood cx and if 2D echo normal and not warranting a TEE.      LOS: 3 days   [name] 05/23/2011, 11:45 AM

## 2011-05-23 NOTE — Progress Notes (Signed)
Subjective:   No recent nausea or vomiting, improving appetite, but still has cloudy, blood-tinged urine.  Objective: Vital signs in last 24 hours: Temp:  [97.5 F (36.4 C)-99.4 F (37.4 C)] 98 F (36.7 C) (05/14 0929) Pulse Rate:  [61-88] 80  (05/14 0929) Resp:  [15-23] 20  (05/14 0929) BP: (99-138)/(36-76) 99/53 mmHg (05/14 0929) SpO2:  [84 %-100 %] 90 % (05/14 0929) Weight:  [93.2 kg (205 lb 7.5 oz)-94.9 kg (209 lb 3.5 oz)] 93.2 kg (205 lb 7.5 oz) (05/13 2056) Weight change: 0.9 kg (1 lb 15.7 oz)  Intake/Output from previous day: 05/13 0701 - 05/14 0700 In: 612 [P.O.:360; IV Piggyback:252] Out: 1500 [Stool:100] Intake/Output this shift: Total I/O In: 120 [P.O.:120] Out: -  EXAM: General appearance:  Alert, in no apparent distress Resp:  CTA bilaterally Cardio:  RRR without murmur GI:  + BS, soft and nontender Extremities:  No edema Access:  AVF @ LUA with + bruit  Lab Results:  Basename 05/22/11 0523 05/21/11 0715  WBC 13.3* 17.9*  HGB 7.7* 7.3*  HCT 27.4* 25.8*  PLT 321 350   BMET:  Basename 05/22/11 0523 05/21/11 0715 05/20/11 2016  NA 138 137 --  K 3.9 3.2* --  CL 96 93* --  CO2 20 29 --  GLUCOSE <20* 289* --  BUN 35* 26* --  CREATININE 5.55* 5.07* --  CALCIUM 8.3* 8.9 --  ALBUMIN -- 2.4* 2.7*   No results found for this basename: PTH:2 in the last 72 hours Iron Studies:  Basename 05/21/11 0715  IRON 16*  TIBC 122*  TRANSFERRIN --  FERRITIN 2174*   Assessment/Plan: 1. N & V/AMS - likely secondary to UTI per UA, improving on IV Zosyn, urine culture with multiple bacterial morphotypes; given history of abdominal surgery, CT of abdomen suggests left ureteral obstruction. +GPC in blood, MRSA, prob cause of GI symptoms.  2. ESRD - HD on MWF @ [place], K 3.9 yesterday.  Next HD tomorrow. 3. Hypertension/volume - BP stable on Metoprolol 50 mg bid; post-HD wt 93.2 yesterday (EDW 93.5 kg) s/p net UF goal of 1.4 L. 4. Anemia - Hgb 7.7 today, on max Epogen and  Fe per Hd s/p Aranesp 200 mcg today and Fe per HD. 5. Metabolic bone disease - Ca 8.3, last P 3.3, on Zemplar 1 mcg per Hd, phoslo 3 with meals. 6. Nutrition - Last Alb 2.4. 7. 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 8. DM - HgbA1C 6.6 on 4/24, on insulin. 9. History of atrial tachycardia - on amiodarone , BB, ASA    LOS: 3 days   LYLES,CHARLES 05/23/2011,9:54 AM  Patient seen and examined and agree with assessment and plan as above. Patient presented with GI symtpoms now growing MRSA in 2/2 blood cultures. Access (AVF) looks fine without signs of infection. ID is evaluating, TTE ordered and repeat cultures. Rec's as above.  Vinson Moselle  MD [facility] [phone] pgr    [phone] cell 05/23/2011, 3:57 PM

## 2011-05-23 NOTE — Progress Notes (Signed)
CRITICAL VALUE ALERT  Critical value received: positive blood cultures all four bottles mrsa  Date of notification:  05/23/11  Time of notification:  0800  Critical value read back:yes  Nurse who received alert:  Mervin Hack rn  MD notified (1st page):  dyungle  Time of first page:  0830  MD notified (2nd page):  Time of second page:  Responding MD:  Dr Gonzella Lex  Time MD responded:  0830

## 2011-05-23 NOTE — Progress Notes (Signed)
Utilization review completed. Keiara Sneeringer RN, CCM  

## 2011-05-24 ENCOUNTER — Inpatient Hospital Stay (HOSPITAL_COMMUNITY): Payer: BC Managed Care – PPO

## 2011-05-24 DIAGNOSIS — A4902 Methicillin resistant Staphylococcus aureus infection, unspecified site: Secondary | ICD-10-CM

## 2011-05-24 DIAGNOSIS — R7881 Bacteremia: Secondary | ICD-10-CM

## 2011-05-24 DIAGNOSIS — I359 Nonrheumatic aortic valve disorder, unspecified: Secondary | ICD-10-CM

## 2011-05-24 DIAGNOSIS — N186 End stage renal disease: Secondary | ICD-10-CM

## 2011-05-24 DIAGNOSIS — E1165 Type 2 diabetes mellitus with hyperglycemia: Secondary | ICD-10-CM

## 2011-05-24 LAB — RENAL FUNCTION PANEL
Albumin: 2.1 g/dL — ABNORMAL LOW (ref 3.5–5.2)
CO2: 26 mEq/L (ref 19–32)
Calcium: 8.2 mg/dL — ABNORMAL LOW (ref 8.4–10.5)
GFR calc Af Amer: 10 mL/min — ABNORMAL LOW (ref >90)
GFR calc non Af Amer: 9 mL/min — ABNORMAL LOW (ref >90)
Sodium: 129 mEq/L — ABNORMAL LOW (ref 135–145)

## 2011-05-24 LAB — CBC
MCH: 22.6 pg — ABNORMAL LOW (ref 26.0–34.0)
Platelets: 418 10*3/uL — ABNORMAL HIGH (ref 150–400)
RBC: 3.23 MIL/uL — ABNORMAL LOW (ref 3.87–5.11)
WBC: 17.8 10*3/uL — ABNORMAL HIGH (ref 4.0–10.5)

## 2011-05-24 MED ORDER — PARICALCITOL 5 MCG/ML IV SOLN
INTRAVENOUS | Status: AC
  Administered 2011-05-24: 1 ug via INTRAVENOUS
  Filled 2011-05-24: qty 1

## 2011-05-24 NOTE — Procedures (Signed)
I was present at this dialysis session. I have reviewed the session itself and made appropriate changes.   Vinson Moselle, MD [employer] 05/24/2011, 4:01 PM

## 2011-05-24 NOTE — Progress Notes (Addendum)
INFECTIOUS DISEASE PROGRESS NOTE  ID: [name] is a 66 y.o. female with ESRD, fistula presented with confusion and work up revealed MRSA bacteremia.    Subjective: No acute events  Abtx:  Anti-infectives     Start     Dose/Rate Route Frequency Ordered Stop   05/22/11 1630   vancomycin (VANCOCIN) IVPB 1000 mg/200 mL premix        1,000 mg 200 mL/hr over 60 Minutes Intravenous Every M-W-F (Hemodialysis) 05/22/11 1628     05/21/11 2200   cefTRIAXone (ROCEPHIN) 1 g in dextrose 5 % 50 mL IVPB  Status:  Discontinued        1 g 100 mL/hr over 30 Minutes Intravenous Every 24 hours 05/21/11 0146 05/21/11 1037   05/21/11 2100   vancomycin (VANCOCIN) 2,000 mg in sodium chloride 0.9 % 500 mL IVPB        2,000 mg 250 mL/hr over 120 Minutes Intravenous  Once 05/21/11 1954 05/21/11 2312   05/21/11 1200   piperacillin-tazobactam (ZOSYN) IVPB 2.25 g  Status:  Discontinued        2.25 g 100 mL/hr over 30 Minutes Intravenous 3 times per day 05/21/11 1102 05/23/11 1040   05/21/11 1000   terbinafine (LAMISIL) tablet 250 mg        250 mg Oral Daily 05/21/11 0146     05/21/11 0000   cefTRIAXone (ROCEPHIN) 1 g in dextrose 5 % 50 mL IVPB        1 g 100 mL/hr over 30 Minutes Intravenous  Once 05/20/11 2353 05/21/11 0033          Medications: I have reviewed the patient's current medications.  Objective: Vital signs in last 24 hours: Temp:  [97.1 F (36.2 C)-98.3 F (36.8 C)] 97.1 F (36.2 C) (05/15 1310) Pulse Rate:  [64-75] 64  (05/15 1530) Resp:  [12-18] 18  (05/15 1530) BP: (95-132)/(53-61) 132/58 mmHg (05/15 1530) SpO2:  [95 %-100 %] 100 % (05/15 1310) Weight:  [205 lb 7.5 oz (93.2 kg)-214 lb 15.2 oz (97.5 kg)] 214 lb 15.2 oz (97.5 kg) (05/15 1310)   General appearance: alert and no distress Resp: clear to auscultation bilaterally Cardio: regular rate and rhythm, S1, S2 normal, no murmur, click, rub or gallop  Lab Results  Basename 05/24/11 1328 05/22/11 0523  WBC 17.8*  13.3*  HGB 7.3* 7.7*  HCT 25.1* 27.4*  NA 129* 138  K 3.7 3.9  CL 93* 96  CO2 26 20  BUN 29* 35*  CREATININE 4.75* 5.55*  GLU -- --   Liver Panel  Basename 05/24/11 1328  PROT --  ALBUMIN 2.1*  AST --  ALT --  ALKPHOS --  BILITOT --  BILIDIR --  IBILI --   Sedimentation Rate No results found for this basename: ESRSEDRATE in the last 72 hours C-Reactive Protein No results found for this basename: CRP:2 in the last 72 hours  Microbiology: Recent Results (from the past 240 hour(s))  CULTURE, BLOOD (ROUTINE X 2)     Status: Normal   Collection Time   05/20/11  8:39 PM      Component Value Range Status Comment   Specimen Description BLOOD RIGHT HAND   Final    Special Requests BOTTLES DRAWN AEROBIC AND ANAEROBIC 10CC EA   Final    Culture  Setup Time [id number]   Final    Culture     Final    Value: STAPHYLOCOCCUS AUREUS     Note: SUSCEPTIBILITIES PERFORMED ON  PREVIOUS CULTURE WITHIN THE LAST 5 DAYS.     Note: Gram Stain Report Called to,Read Back By and Verified With: RN VICKY M. ON 05/21/11 AT 1930 BY TEDAR   Report Status 05/23/2011 FINAL   Final   CULTURE, BLOOD (ROUTINE X 2)     Status: Normal   Collection Time   05/20/11  8:46 PM      Component Value Range Status Comment   Specimen Description BLOOD RIGHT ARM   Final    Special Requests BOTTLES DRAWN AEROBIC ONLY 8CC   Final    Culture  Setup Time [id number]   Final    Culture     Final    Value: METHICILLIN RESISTANT STAPHYLOCOCCUS AUREUS     Note: RIFAMPIN AND GENTAMICIN SHOULD NOT BE USED AS SINGLE DRUGS FOR TREATMENT OF STAPH INFECTIONS. CRITICAL RESULT CALLED TO, READ BACK BY AND VERIFIED WITH: NANCY BINDHAMMER 05/23/11 0750 BY SMITHERSJ     Note: Gram Stain Report Called to,Read Back By and Verified With: RN VICKY M. ON 05/21/11 AT 1930 BY TEDAR   Report Status 05/23/2011 FINAL   Final    Organism ID, Bacteria METHICILLIN RESISTANT STAPHYLOCOCCUS AUREUS   Final   URINE CULTURE     Status: Normal    Collection Time   05/20/11 10:19 PM      Component Value Range Status Comment   Specimen Description URINE, RANDOM   Final    Special Requests NONE   Final    Culture  Setup Time [id number]   Final    Colony Count >=100,000 COLONIES/ML   Final    Culture     Final    Value: Multiple bacterial morphotypes present, none predominant. Suggest appropriate recollection if clinically indicated.   Report Status 05/22/2011 FINAL   Final   URINE CULTURE     Status: Normal   Collection Time   05/21/11  2:46 AM      Component Value Range Status Comment   Specimen Description URINE, RANDOM   Final    Special Requests NONE   Final    Culture  Setup Time [id number]   Final    Colony Count >=100,000 COLONIES/ML   Final    Culture     Final    Value: Multiple bacterial morphotypes present, none predominant. Suggest appropriate recollection if clinically indicated.   Report Status 05/22/2011 FINAL   Final   MRSA PCR SCREENING     Status: Abnormal   Collection Time   05/21/11  3:02 AM      Component Value Range Status Comment   MRSA by PCR POSITIVE (*) NEGATIVE  Final     Studies/Results: No results found.   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 TTE not yet done.  Repeat cultures sent last night.  Vancomycin MIC is 2 and if the patients WBC continues to increase or becomes febrile, she may need to be switched to daptomycin.   Will continue to monitor.  Dr. Daiva Eves on tomorrow.   [name] Infectious Diseases 05/24/2011, 3:50 PM

## 2011-05-24 NOTE — Progress Notes (Signed)
Inpatient Diabetes Program Recommendations  AACE/ADA: New Consensus Statement on Inpatient Glycemic Control (2009)  Target Ranges:  Prepandial:   less than 140 mg/dL      Peak postprandial:   less than 180 mg/dL (1-2 hours)      Critically ill patients:  140 - 180 mg/dL    Results for ALEIDA, CRANDELL (MRN [id number]) as of 05/24/2011 12:41  Ref. Range 05/23/2011 07:38 05/23/2011 11:45 05/23/2011 16:55 05/23/2011 21:35  Glucose-Capillary Latest Range: 70-99 mg/dL 841 (H) 324 (H) 401 (H) 223 (H)    Results for TYSHEENA, GINZBURG (MRN [id number]) as of 05/24/2011 12:41  Ref. Range 05/24/2011 08:54 05/24/2011 11:44  Glucose-Capillary Latest Range: 70-99 mg/dL 403 (H) 474 (H)    Inpatient Diabetes Program Recommendations Correction (SSI): Please increase Novolog correction scale (SSI) to Moderate scale tid ac + HS.  Note: Will follow. Ambrose Finland RN, MSN, CDE Diabetes Coordinator Inpatient Diabetes Program [phone]

## 2011-05-24 NOTE — Progress Notes (Signed)
Subjective:  Feeling much better; seen on HD; denies c/p, or increased SOB  Vital signs in last 24 hours: Filed Vitals:   05/24/11 0930 05/24/11 1310 05/24/11 1319 05/24/11 1322  BP: 118/55 120/58 122/58 126/57  Pulse: 66 66 64 65  Temp: 98.1 F (36.7 C) 97.1 F (36.2 C)    TempSrc: Oral Oral    Resp: 18 16 13 14   Height:      Weight:  97.5 kg (214 lb 15.2 oz)    SpO2: 96% 100%     Weight change: -1.7 kg (-3 lb 12 oz)  Intake/Output Summary (Last 24 hours) at 05/24/11 1327 Last data filed at 05/24/11 0931  Gross per 24 hour  Intake    840 ml  Output      0 ml  Net    840 ml   Labs: Basic Metabolic Panel:  Lab 05/22/11 3086 05/21/11 0715 05/20/11 2016  NA 138 137 136  K 3.9 3.2* 3.0*  CL 96 93* 92*  CO2 20 29 30   GLUCOSE <20* 289* 320*  BUN 35* 26* 20  CREATININE 5.55* 5.07* 4.33*  CALCIUM 8.3* 8.9 8.8  ALB -- -- --  PHOS -- 3.3 --   Liver Function Tests:  Lab 05/21/11 0715 05/20/11 2016  AST 10 10  ALT 7 6  ALKPHOS 124* 140*  BILITOT 0.4 0.5  PROT 7.3 7.8  ALBUMIN 2.4* 2.7*   No results found for this basename: LIPASE:3,AMYLASE:3 in the last 168 hours No results found for this basename: AMMONIA:3 in the last 168 hours CBC:  Lab 05/22/11 0523 05/21/11 0715 05/20/11 2016  WBC 13.3* 17.9* 19.3*  NEUTROABS -- -- 17.4*  HGB 7.7* 7.3* 7.9*  HCT 27.4* 25.8* 27.7*  MCV 80.8 80.4 82.2  PLT 321 350 401*   Cardiac Enzymes: No results found for this basename: CKTOTAL:5,CKMB:5,CKMBINDEX:5,TROPONINI:5 in the last 168 hours CBG:  Lab 05/24/11 1144 05/24/11 0854 05/23/11 2135 05/23/11 1655 05/23/11 1145  GLUCAP 179* 219* 223* 268* 206*    Iron Studies: No results found for this basename: IRON,TIBC,TRANSFERRIN,FERRITIN in the last 72 hours Studies/Results: No results found. Medications:      . amiodarone  200 mg Oral Daily  . aspirin  81 mg Oral Daily  . calcium acetate  2,001 mg Oral TID  . Chlorhexidine Gluconate Cloth  6 each Topical Q0600  .  darbepoetin (ARANESP) injection - DIALYSIS  200 mcg Intravenous Q Mon-HD  . docusate sodium  100 mg Oral BID  . enoxaparin (LOVENOX) injection  30 mg Subcutaneous Q24H  . ferric gluconate (FERRLECIT/NULECIT) IV  125 mg Intravenous Q M,W,F-HD  . insulin aspart  0-5 Units Subcutaneous QHS  . insulin aspart  0-9 Units Subcutaneous TID WC  . insulin lispro protamine-insulin lispro  20 Units Subcutaneous Q breakfast  . isosorbide mononitrate  60 mg Oral Daily  . metoprolol  50 mg Oral BID  . mupirocin ointment  1 application Nasal BID  . paricalcitol  1 mcg Intravenous Q M,W,F-HD  . simvastatin  10 mg Oral q1800  . sodium chloride  3 mL Intravenous Q12H  . sodium chloride  3 mL Intravenous Q12H  . terbinafine  250 mg Oral Daily  . vancomycin  1,000 mg Intravenous Q M,W,F-HD    I  have reviewed scheduled and prn medications.  Physical Exam: General: comfortable Heart: distant S1, S2 Lungs: diminished bilat without adventitious B.S. Abdomen: obese, soft, slight diffuse upper quad tenderness, ND, +T/B Extremities: no ankle edema Dialysis  Access: LUA AVF +T/B  Problem/Plan: 1. N & V/AMS - likely secondary to infection (UTI and/or Bacteremia); improved on IV Zosyn and Vancomycin  2. MRSA Bacteremia- on Vanc with ID following; 2D Echo pending (called lab; they will attempt to get it done today) 3. ESRD - HD on MWF @ [place], K 3.7 on 4K bath today. 4. Hypertension/volume/CAD - BP controlled on meds (lopressor, imdur) and UF on HD; stable on Metoprolol 50 mg bid; post-HD wt 93.2 yesterday (EDW 93.5 kg) s/p net UF goal of 1.4 L. 5. Anemia - Hgb fluctuating 7.3 to 7.7 to 7.3; chronic low even in outpt setting;  Aranesp 200 mcg today and IV Fe bolus (MWF) in progress; follow closely and consider transfusion if remains < 7.5 6. Metabolic bone disease - phos 1.3, calcium 8.2 on Phoslo and Zemplar; Hold Phoslo for now and cont follow ca/phos 7. Nutrition - albumin 2.1; appetite poor; just starting to  eat again; cont ^ protein renal diet with Nepro suppl; follow 8. 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 9. DM/HLD - well controlled in outpt setting with HgbA1C 6.6 on 4/24, CBG and SSI; Statin therapy; primary following  10. History of atrial tachycardia - on amiodarone , BB, lovenox ASA  11. Disposition- per primary services   Samuel Germany, FNP-C [facility] Pager [phone]  05/24/2011,1:27 PM  LOS: 4 days   Patient seen and examined and agree with assessment and plan as above.  Vinson Moselle  MD [facility] [phone] pgr    [phone] cell 05/24/2011, 4:00 PM

## 2011-05-24 NOTE — Progress Notes (Signed)
Physical Therapy Treatment Patient Details Name: [name] MRN: [id number] DOB: 18-Oct-1945 Today's Date: 05/24/2011 Time: 4098-1191 PT Time Calculation (min): 22 min  PT Assessment / Plan / Recommendation Comments on Treatment Session  Participating well; Needed fewer standing rest breaks, but stil with noticable fatigue    Follow Up Recommendations  Home health PT;Supervision - Intermittent    Barriers to Discharge        Equipment Recommendations  3 in 1 bedside comode    Recommendations for Other Services    Frequency Min 3X/week   Plan Discharge plan remains appropriate    Precautions / Restrictions Precautions Precautions: Fall Restrictions Weight Bearing Restrictions: No   Pertinent Vitals/Pain Noted DOE 2/4 with amb on room air; O2 sats 90% once back in room; Restarted O2    Mobility  Transfers Transfers: Sit to Stand;Stand to Sit Sit to Stand: 4: Min guard;With armrests;From chair/3-in-1 Stand to Sit: 4: Min assist;With upper extremity assist;To chair/3-in-1 Details for Transfer Assistance: continued cues for control Ambulation/Gait Ambulation/Gait Assistance: 4: Min guard Ambulation Distance (Feet): 50 Feet Assistive device: Rolling walker Ambulation/Gait Assistance Details: Continued cues for self monitor for activity tol secondary to anemia Gait Pattern: Decreased stride length    Exercises     PT Diagnosis:    PT Problem List:   PT Treatment Interventions:     PT Goals Acute Rehab PT Goals Time For Goal Achievement: 06/06/11 Potential to Achieve Goals: Good Pt will go Sit to Stand: with modified independence PT Goal: Sit to Stand - Progress: Progressing toward goal Pt will go Stand to Sit: with modified independence PT Goal: Stand to Sit - Progress: Progressing toward goal Pt will Ambulate: 51 - 150 feet;with modified independence;with least restrictive assistive device PT Goal: Ambulate - Progress: Progressing toward goal  Visit  Information  Last PT Received On: 05/24/11 Assistance Needed: +1    Subjective Data  Subjective: Agreeable to amb Patient Stated Goal: Get better and go home   Cognition  Overall Cognitive Status: Appears within functional limits for tasks assessed/performed Arousal/Alertness: Awake/alert Orientation Level: Oriented X4 / Intact Behavior During Session: [facility] for tasks performed    Balance     End of Session PT - End of Session Equipment Utilized During Treatment: Gait belt Activity Tolerance: Patient tolerated treatment well;Patient limited by fatigue Patient left: in chair;with call bell/phone within reach Nurse Communication: Mobility status    [name], [place] [phone]'  05/24/2011, 4:00 PM

## 2011-05-24 NOTE — Progress Notes (Signed)
Subjective: Patient sitting in chair, feeling better, less confused.   Objective: Filed Vitals:   05/24/11 1319 05/24/11 1322 05/24/11 1345 05/24/11 1400  BP: 122/58 126/57 130/58 126/59  Pulse: 64 65 64 65  Temp:      TempSrc:      Resp: 13 14 14 13   Height:      Weight:      SpO2:       Weight change: -1.7 kg (-3 lb 12 oz)   General: Alert, awake, oriented x3, in no acute distress.  HEENT: No bruits, no goiter.  Heart: Regular rate and rhythm, without murmurs, rubs, gallops.  Lungs: CTA, bilateral air movement.  Abdomen: Soft, nontender, nondistended, positive bowel sounds.  Neuro: Grossly intact, nonfocal. Extremities; no edema.   Lab Results:  [facility] 05/24/11 1328 05/22/11 0523  NA 129* 138  K 3.7 3.9  CL 93* 96  CO2 26 20  GLUCOSE 190* <20*  BUN 29* 35*  CREATININE 4.75* 5.55*  CALCIUM 8.2* 8.3*  MG -- --  PHOS 1.3* --    Basename 05/24/11 1328  AST --  ALT --  ALKPHOS --  BILITOT --  PROT --  ALBUMIN 2.1*    Basename 05/24/11 1328 05/22/11 0523  WBC 17.8* 13.3*  NEUTROABS -- --  HGB 7.3* 7.7*  HCT 25.1* 27.4*  MCV 77.7* 80.8  PLT 418* 321    Micro Results: Recent Results (from the past 240 hour(s))  CULTURE, BLOOD (ROUTINE X 2)     Status: Normal   Collection Time   05/20/11  8:39 PM      Component Value Range Status Comment   Specimen Description BLOOD RIGHT HAND   Final    Special Requests BOTTLES DRAWN AEROBIC AND ANAEROBIC 10CC EA   Final    Culture  Setup Time [id number]   Final    Culture     Final    Value: STAPHYLOCOCCUS AUREUS     Note: SUSCEPTIBILITIES PERFORMED ON PREVIOUS CULTURE WITHIN THE LAST 5 DAYS.     Note: Gram Stain Report Called to,Read Back By and Verified With: RN VICKY M. ON 05/21/11 AT 1930 BY TEDAR   Report Status 05/23/2011 FINAL   Final   CULTURE, BLOOD (ROUTINE X 2)     Status: Normal   Collection Time   05/20/11  8:46 PM      Component Value Range Status Comment   Specimen Description BLOOD RIGHT ARM    Final    Special Requests BOTTLES DRAWN AEROBIC ONLY 8CC   Final    Culture  Setup Time [id number]   Final    Culture     Final    Value: METHICILLIN RESISTANT STAPHYLOCOCCUS AUREUS     Note: RIFAMPIN AND GENTAMICIN SHOULD NOT BE USED AS SINGLE DRUGS FOR TREATMENT OF STAPH INFECTIONS. CRITICAL RESULT CALLED TO, READ BACK BY AND VERIFIED WITH: NANCY BINDHAMMER 05/23/11 0750 BY SMITHERSJ     Note: Gram Stain Report Called to,Read Back By and Verified With: RN VICKY M. ON 05/21/11 AT 1930 BY TEDAR   Report Status 05/23/2011 FINAL   Final    Organism ID, Bacteria METHICILLIN RESISTANT STAPHYLOCOCCUS AUREUS   Final   URINE CULTURE     Status: Normal   Collection Time   05/20/11 10:19 PM      Component Value Range Status Comment   Specimen Description URINE, RANDOM   Final    Special Requests NONE   Final    Culture  Setup  Time [id number]   Final    Colony Count >=100,000 COLONIES/ML   Final    Culture     Final    Value: Multiple bacterial morphotypes present, none predominant. Suggest appropriate recollection if clinically indicated.   Report Status 05/22/2011 FINAL   Final   URINE CULTURE     Status: Normal   Collection Time   05/21/11  2:46 AM      Component Value Range Status Comment   Specimen Description URINE, RANDOM   Final    Special Requests NONE   Final    Culture  Setup Time [id number]   Final    Colony Count >=100,000 COLONIES/ML   Final    Culture     Final    Value: Multiple bacterial morphotypes present, none predominant. Suggest appropriate recollection if clinically indicated.   Report Status 05/22/2011 FINAL   Final   MRSA PCR SCREENING     Status: Abnormal   Collection Time   05/21/11  3:02 AM      Component Value Range Status Comment   MRSA by PCR POSITIVE (*) NEGATIVE  Final     Studies/Results: No results found.  Medications: I have reviewed the patient's current medications.  Assessment/  66 y/o female with multiple medical problems including CAD, DM,  ESRD on HD, colon ca s/p surgery with colostomy presents with AMS in the setting of MRSA bacteremia.   Plan:   1-Encephalopathy with MRSA bacteremia  Patient much oriented for past 2 days and back to baseline.  Blood cx on admission 05-11 ( both sets) growing MRSA, seen by Dr Luciana Axe  and recommends to continue IV vanco (was started on 5/13)  -ID recommends 6 weeks of vancomycin from 1st negative blood culture , repeat cx ordered 5-14 pending results. recommends 2D echo. No need for TEE unless 2D echo concerning.   2-? UTI  - Patient Has hx of UTI in past with citrobacter and klebsiella in cx which was pansensitive. This admission was initially thought to be due to UTI and started on ceftriaxone which was broadened to zosyn. cx  patient has significant leucocytosis and low grade temp on presentation. Started on IV ceftraixone and switched her abx to zosyn for broader coverage . Urine cx showed mixed organism and likely a contamination. Did not have any dysuria. She received 2 days of Zosyn and 1 day ceftriaxone. Will follow wbc.   3-ESRD (end stage renal disease) on dialysis  HD days are M,W,F , renal following.  4-DM (diabetes mellitus), type 2, uncontrolled, with renal complications  Patient noted to be hypoglycemic on 5/13 and given D50. fsg now stable  Cont home dose insulin 50/50. I will avoid meals coverage to avoid hypoglycemia in a dialysis patient on insulin 50/50.   5-Coronary artery disease  Cont BB, statin and ASA  6-Hx of atrial tachycardia  on amiodarone  7-Chest pain  Likely atypical and denies any symptoms at present, monitor on tele  mild treponemia possible related to demand ischemia. ECHO pending.  Asymptomatic now. Check EKG.  8-Anemia  Baseline Hb around 8-9 , 7.7 at present. IV iron with dialysis.   DVT prophylaxis  Sq lovenox  Diet; renal/ diabetic  Full code   Consult ; Dr Luciana Axe  Pending issues  2D echo.  Follow up blood cx           LOS: 4 days     Jarrod Mcenery M.D.  [facility] 05/24/2011, 2:07 PM

## 2011-05-24 NOTE — Progress Notes (Signed)
  Echocardiogram 2D Echocardiogram has been performed.  Mercy Moore 05/24/2011, 4:46 PM

## 2011-05-25 DIAGNOSIS — R7881 Bacteremia: Secondary | ICD-10-CM

## 2011-05-25 DIAGNOSIS — A4902 Methicillin resistant Staphylococcus aureus infection, unspecified site: Secondary | ICD-10-CM

## 2011-05-25 DIAGNOSIS — N390 Urinary tract infection, site not specified: Secondary | ICD-10-CM

## 2011-05-25 DIAGNOSIS — N186 End stage renal disease: Secondary | ICD-10-CM

## 2011-05-25 DIAGNOSIS — E1165 Type 2 diabetes mellitus with hyperglycemia: Secondary | ICD-10-CM

## 2011-05-25 LAB — CBC
Hemoglobin: 7.6 g/dL — ABNORMAL LOW (ref 12.0–15.0)
MCH: 23.4 pg — ABNORMAL LOW (ref 26.0–34.0)
MCV: 81.2 fL (ref 78.0–100.0)
Platelets: 366 10*3/uL (ref 150–400)
RBC: 3.25 MIL/uL — ABNORMAL LOW (ref 3.87–5.11)

## 2011-05-25 LAB — GLUCOSE, CAPILLARY
Glucose-Capillary: 183 mg/dL — ABNORMAL HIGH (ref 70–99)
Glucose-Capillary: 178 mg/dL — ABNORMAL HIGH (ref 70–99)

## 2011-05-25 MED ORDER — SODIUM CHLORIDE 0.9 % IV SOLN
6.0000 mg/kg | Freq: Once | INTRAVENOUS | Status: AC
  Administered 2011-05-25: 387.5 mg via INTRAVENOUS
  Filled 2011-05-25: qty 7.75

## 2011-05-25 MED ORDER — SODIUM CHLORIDE 0.9 % IV SOLN
6.0000 mg/kg | INTRAVENOUS | Status: DC
  Filled 2011-05-25: qty 7.75

## 2011-05-25 NOTE — Progress Notes (Signed)
   CARE MANAGEMENT NOTE 05/25/2011  Patient:  [name], [name]   Account Number:  [id number]  Date Initiated:  05/23/2011  Documentation initiated by:  Darlyne Russian  Subjective/Objective Assessment:   Patient admitted with AMS and UTI  Order for [facility] and DME     Action/Plan:   Progression of care and discharge planning  05/24/2011 Pt in hemodialysis, will see in am.   Anticipated DC Date:  05/26/2011   Anticipated DC Plan:  HOME W HOME HEALTH SERVICES      DC Planning Services  CM consult      Choice offered to / List presented to:             Status of service:  In process, will continue to follow Medicare Important Message given?   (If response is "NO", the following Medicare IM given date fields will be blank) Date Medicare IM given:   Date Additional Medicare IM given:    Discharge Disposition:  HOME W HOME HEALTH SERVICES  Per UR Regulation:  Reviewed for med. necessity/level of care/duration of stay  If discussed at Long Length of Stay Meetings, dates discussed:    Comments:  05/25/2011 [address] RN, [place] [phone] Met with patient to discuss discharge planning and home health services for home PT, OT and 3:1 commode chair. She requests referral to [facility], she has used them in the past. She declines the 3:1 commode, she has DME at home, cane, walker, wheelchair and shower chair. Her daughter drives her to MD appointments. CM to continue to follow for discharge planning needs.  Call to [facility] for referral: spoke with Hilda Lias for the PT and OT  05/23/2011 [address] RN, [place] [phone] Utilization review completed.

## 2011-05-25 NOTE — Progress Notes (Signed)
Subjective: No new complaints   Antibiotics:  Anti-infectives     Start     Dose/Rate Route Frequency Ordered Stop   05/22/11 1630   vancomycin (VANCOCIN) IVPB 1000 mg/200 mL premix        1,000 mg 200 mL/hr over 60 Minutes Intravenous Every M-W-F (Hemodialysis) 05/22/11 1628     05/21/11 2200   cefTRIAXone (ROCEPHIN) 1 g in dextrose 5 % 50 mL IVPB  Status:  Discontinued        1 g 100 mL/hr over 30 Minutes Intravenous Every 24 hours 05/21/11 0146 05/21/11 1037   05/21/11 2100   vancomycin (VANCOCIN) 2,000 mg in sodium chloride 0.9 % 500 mL IVPB        2,000 mg 250 mL/hr over 120 Minutes Intravenous  Once 05/21/11 1954 05/21/11 2312   05/21/11 1200   piperacillin-tazobactam (ZOSYN) IVPB 2.25 g  Status:  Discontinued        2.25 g 100 mL/hr over 30 Minutes Intravenous 3 times per day 05/21/11 1102 05/23/11 1040   05/21/11 1000   terbinafine (LAMISIL) tablet 250 mg        250 mg Oral Daily 05/21/11 0146     05/21/11 0000   cefTRIAXone (ROCEPHIN) 1 g in dextrose 5 % 50 mL IVPB        1 g 100 mL/hr over 30 Minutes Intravenous  Once 05/20/11 2353 05/21/11 0033          Medications: Scheduled Meds:   . amiodarone  200 mg Oral Daily  . aspirin  81 mg Oral Daily  . Chlorhexidine Gluconate Cloth  6 each Topical Q0600  . darbepoetin (ARANESP) injection - DIALYSIS  200 mcg Intravenous Q Mon-HD  . docusate sodium  100 mg Oral BID  . enoxaparin (LOVENOX) injection  30 mg Subcutaneous Q24H  . ferric gluconate (FERRLECIT/NULECIT) IV  125 mg Intravenous Q M,W,F-HD  . insulin lispro protamine-insulin lispro  20 Units Subcutaneous Q breakfast  . isosorbide mononitrate  60 mg Oral Daily  . metoprolol  50 mg Oral BID  . mupirocin ointment  1 application Nasal BID  . paricalcitol  1 mcg Intravenous Q M,W,F-HD  . simvastatin  10 mg Oral q1800  . sodium chloride  3 mL Intravenous Q12H  . sodium chloride  3 mL Intravenous Q12H  . terbinafine  250 mg Oral Daily  . vancomycin  1,000 mg  Intravenous Q M,W,F-HD  . DISCONTD: calcium acetate  2,001 mg Oral TID   Continuous Infusions:  PRN Meds:.sodium chloride, acetaminophen, acetaminophen, albuterol, alum & mag hydroxide-simeth, guaiFENesin-dextromethorphan, HYDROcodone-acetaminophen, morphine injection, ondansetron (ZOFRAN) IV, ondansetron, sodium chloride   Objective: Weight change: 9 lb 7.7 oz (4.3 kg)  Intake/Output Summary (Last 24 hours) at 05/25/11 1448 Last data filed at 05/25/11 1325  Gross per 24 hour  Intake    440 ml  Output   2070 ml  Net  -1630 ml   Blood pressure 129/53, pulse 75, temperature 97.7 F (36.5 C), temperature source Oral, resp. rate 18, height 5' (1.524 m), weight 205 lb 11 oz (93.3 kg), SpO2 100.00%. Temp:  [97 F (36.1 C)-98.1 F (36.7 C)] 97.7 F (36.5 C) (05/16 1325) Pulse Rate:  [64-77] 75  (05/16 1325) Resp:  [12-23] 18  (05/16 1325) BP: (98-132)/(47-64) 129/53 mmHg (05/16 1325) SpO2:  [96 %-100 %] 100 % (05/16 1325) Weight:  [205 lb 11 oz (93.3 kg)-210 lb 12.2 oz (95.6 kg)] 205 lb 11 oz (93.3 kg) (05/15 2230)  Physical Exam:  General appearance: alert and no distress  Resp: clear to auscultation bilaterally  Cardio: regular rate and rhythm, S1, S2 normal, no murmur, click, rub or gallop   Lab Results:  Basename 05/25/11 0525 05/24/11 1328  WBC 13.7* 17.8*  HGB 7.6* 7.3*  HCT 26.4* 25.1*  PLT 366 418*    BMET  Basename 05/24/11 1328  NA 129*  K 3.7  CL 93*  CO2 26  GLUCOSE 190*  BUN 29*  CREATININE 4.75*  CALCIUM 8.2*    Micro Results: Recent Results (from the past 240 hour(s))  CULTURE, BLOOD (ROUTINE X 2)     Status: Normal   Collection Time   05/20/11  8:39 PM      Component Value Range Status Comment   Specimen Description BLOOD RIGHT HAND   Final    Special Requests BOTTLES DRAWN AEROBIC AND ANAEROBIC 10CC EA   Final    Culture  Setup Time [id number]   Final    Culture     Final    Value: STAPHYLOCOCCUS AUREUS     Note: SUSCEPTIBILITIES PERFORMED  ON PREVIOUS CULTURE WITHIN THE LAST 5 DAYS.     Note: Gram Stain Report Called to,Read Back By and Verified With: RN VICKY M. ON 05/21/11 AT 1930 BY TEDAR   Report Status 05/23/2011 FINAL   Final   CULTURE, BLOOD (ROUTINE X 2)     Status: Normal   Collection Time   05/20/11  8:46 PM      Component Value Range Status Comment   Specimen Description BLOOD RIGHT ARM   Final    Special Requests BOTTLES DRAWN AEROBIC ONLY 8CC   Final    Culture  Setup Time [id number]   Final    Culture     Final    Value: METHICILLIN RESISTANT STAPHYLOCOCCUS AUREUS     Note: RIFAMPIN AND GENTAMICIN SHOULD NOT BE USED AS SINGLE DRUGS FOR TREATMENT OF STAPH INFECTIONS. CRITICAL RESULT CALLED TO, READ BACK BY AND VERIFIED WITH: NANCY BINDHAMMER 05/23/11 0750 BY SMITHERSJ     Note: Gram Stain Report Called to,Read Back By and Verified With: RN VICKY M. ON 05/21/11 AT 1930 BY TEDAR   Report Status 05/23/2011 FINAL   Final    Organism ID, Bacteria METHICILLIN RESISTANT STAPHYLOCOCCUS AUREUS   Final   URINE CULTURE     Status: Normal   Collection Time   05/20/11 10:19 PM      Component Value Range Status Comment   Specimen Description URINE, RANDOM   Final    Special Requests NONE   Final    Culture  Setup Time [id number]   Final    Colony Count >=100,000 COLONIES/ML   Final    Culture     Final    Value: Multiple bacterial morphotypes present, none predominant. Suggest appropriate recollection if clinically indicated.   Report Status 05/22/2011 FINAL   Final   URINE CULTURE     Status: Normal   Collection Time   05/21/11  2:46 AM      Component Value Range Status Comment   Specimen Description URINE, RANDOM   Final    Special Requests NONE   Final    Culture  Setup Time [id number]   Final    Colony Count >=100,000 COLONIES/ML   Final    Culture     Final    Value: Multiple bacterial morphotypes present, none predominant. Suggest appropriate recollection if clinically indicated.   Report Status 05/22/2011  FINAL  Final   MRSA PCR SCREENING     Status: Abnormal   Collection Time   05/21/11  3:02 AM      Component Value Range Status Comment   MRSA by PCR POSITIVE (*) NEGATIVE  Final   CULTURE, BLOOD (ROUTINE X 2)     Status: Normal (Preliminary result)   Collection Time   05/23/11  7:56 PM      Component Value Range Status Comment   Specimen Description BLOOD RIGHT HAND   Final    Special Requests BOTTLES DRAWN AEROBIC ONLY 5CC   Final    Culture  Setup Time [id number]   Final    Culture     Final    Value:        BLOOD CULTURE RECEIVED NO GROWTH TO DATE CULTURE WILL BE HELD FOR 5 DAYS BEFORE ISSUING A FINAL NEGATIVE REPORT   Report Status PENDING   Incomplete   CULTURE, BLOOD (ROUTINE X 2)     Status: Normal (Preliminary result)   Collection Time   05/23/11  8:42 PM      Component Value Range Status Comment   Specimen Description BLOOD RIGHT HAND   Final    Special Requests BOTTLES DRAWN AEROBIC ONLY 5CC   Final    Culture  Setup Time [id number]   Final    Culture     Final    Value:        BLOOD CULTURE RECEIVED NO GROWTH TO DATE CULTURE WILL BE HELD FOR 5 DAYS BEFORE ISSUING A FINAL NEGATIVE REPORT   Report Status PENDING   Incomplete     Studies/Results: No results found.    Assessment/Plan: [name] is a 66 y.o. female with  atrial disease with fistula present and MRSA bacteremia with vancomycin MIC equal to 2  --Reasonable to continue vancomycin though I did of high her anxiety about risk for failure with the increased MIC. Note there is also a risk for daptomycin resistant with the higher MIC to vancomycin the last the [place] about a lab to test for daptomycin susceptibilities in this organism. Other possible agents could include zyvox, or teflaro      LOS: 5 days   Acey Lav 05/25/2011, 2:48 PM

## 2011-05-25 NOTE — Progress Notes (Signed)
PT Cancellation Note  Treatment cancelled today due to patient's refusal to participate. Pt just finished lunch and is tired.  Rafi Kenneth 05/25/2011, 1:44 PM  [employer] PT [phone]

## 2011-05-25 NOTE — Progress Notes (Signed)
Subjective: Patient feeling better, back to baseline.  Objective: Filed Vitals:   05/24/11 2230 05/25/11 0528 05/25/11 0918 05/25/11 1325  BP: 109/64 98/51 125/48 129/53  Pulse: 77 70 67 75  Temp: 97.8 F (36.6 C) 98.1 F (36.7 C) 97.8 F (36.6 C) 97.7 F (36.5 C)  TempSrc: Oral Oral Oral Oral  Resp: 19 18 18 18   Height:      Weight: 93.3 kg (205 lb 11 oz)     SpO2: 100% 97% 96% 100%   Weight change: 4.3 kg (9 lb 7.7 oz)   General: Alert, awake, oriented x3, in no acute distress.  HEENT: No bruits, no goiter.  Heart: Regular rate and rhythm, without murmurs, rubs, gallops.  Lungs: CTA, bilateral air movement.  Abdomen: Soft, nontender, nondistended, positive bowel sounds.  Neuro: Grossly intact, nonfocal.   Lab Results:  Basename 05/24/11 1328  NA 129*  K 3.7  CL 93*  CO2 26  GLUCOSE 190*  BUN 29*  CREATININE 4.75*  CALCIUM 8.2*  MG --  PHOS 1.3*    Basename 05/24/11 1328  AST --  ALT --  ALKPHOS --  BILITOT --  PROT --  ALBUMIN 2.1*   No results found for this basename: LIPASE:2,AMYLASE:2 in the last 72 hours  Basename 05/25/11 0525 05/24/11 1328  WBC 13.7* 17.8*  NEUTROABS -- --  HGB 7.6* 7.3*  HCT 26.4* 25.1*  MCV 81.2 77.7*  PLT 366 418*    Micro Results: Recent Results (from the past 240 hour(s))  CULTURE, BLOOD (ROUTINE X 2)     Status: Normal   Collection Time   05/20/11  8:39 PM      Component Value Range Status Comment   Specimen Description BLOOD RIGHT HAND   Final    Special Requests BOTTLES DRAWN AEROBIC AND ANAEROBIC 10CC EA   Final    Culture  Setup Time [id number]   Final    Culture     Final    Value: STAPHYLOCOCCUS AUREUS     Note: SUSCEPTIBILITIES PERFORMED ON PREVIOUS CULTURE WITHIN THE LAST 5 DAYS.     Note: Gram Stain Report Called to,Read Back By and Verified With: RN VICKY M. ON 05/21/11 AT 1930 BY TEDAR   Report Status 05/23/2011 FINAL   Final   CULTURE, BLOOD (ROUTINE X 2)     Status: Normal   Collection Time   05/20/11  8:46 PM      Component Value Range Status Comment   Specimen Description BLOOD RIGHT ARM   Final    Special Requests BOTTLES DRAWN AEROBIC ONLY 8CC   Final    Culture  Setup Time [id number]   Final    Culture     Final    Value: METHICILLIN RESISTANT STAPHYLOCOCCUS AUREUS     Note: RIFAMPIN AND GENTAMICIN SHOULD NOT BE USED AS SINGLE DRUGS FOR TREATMENT OF STAPH INFECTIONS. CRITICAL RESULT CALLED TO, READ BACK BY AND VERIFIED WITH: NANCY BINDHAMMER 05/23/11 0750 BY SMITHERSJ     Note: Gram Stain Report Called to,Read Back By and Verified With: RN VICKY M. ON 05/21/11 AT 1930 BY TEDAR   Report Status 05/23/2011 FINAL   Final    Organism ID, Bacteria METHICILLIN RESISTANT STAPHYLOCOCCUS AUREUS   Final   URINE CULTURE     Status: Normal   Collection Time   05/20/11 10:19 PM      Component Value Range Status Comment   Specimen Description URINE, RANDOM   Final    Special Requests  NONE   Final    Culture  Setup Time [id number]   Final    Colony Count >=100,000 COLONIES/ML   Final    Culture     Final    Value: Multiple bacterial morphotypes present, none predominant. Suggest appropriate recollection if clinically indicated.   Report Status 05/22/2011 FINAL   Final   URINE CULTURE     Status: Normal   Collection Time   05/21/11  2:46 AM      Component Value Range Status Comment   Specimen Description URINE, RANDOM   Final    Special Requests NONE   Final    Culture  Setup Time [id number]   Final    Colony Count >=100,000 COLONIES/ML   Final    Culture     Final    Value: Multiple bacterial morphotypes present, none predominant. Suggest appropriate recollection if clinically indicated.   Report Status 05/22/2011 FINAL   Final   MRSA PCR SCREENING     Status: Abnormal   Collection Time   05/21/11  3:02 AM      Component Value Range Status Comment   MRSA by PCR POSITIVE (*) NEGATIVE  Final   CULTURE, BLOOD (ROUTINE X 2)     Status: Normal (Preliminary result)   Collection Time    05/23/11  7:56 PM      Component Value Range Status Comment   Specimen Description BLOOD RIGHT HAND   Final    Special Requests BOTTLES DRAWN AEROBIC ONLY 5CC   Final    Culture  Setup Time [id number]   Final    Culture     Final    Value:        BLOOD CULTURE RECEIVED NO GROWTH TO DATE CULTURE WILL BE HELD FOR 5 DAYS BEFORE ISSUING A FINAL NEGATIVE REPORT   Report Status PENDING   Incomplete   CULTURE, BLOOD (ROUTINE X 2)     Status: Normal (Preliminary result)   Collection Time   05/23/11  8:42 PM      Component Value Range Status Comment   Specimen Description BLOOD RIGHT HAND   Final    Special Requests BOTTLES DRAWN AEROBIC ONLY 5CC   Final    Culture  Setup Time [id number]   Final    Culture     Final    Value:        BLOOD CULTURE RECEIVED NO GROWTH TO DATE CULTURE WILL BE HELD FOR 5 DAYS BEFORE ISSUING A FINAL NEGATIVE REPORT   Report Status PENDING   Incomplete     Studies/Results: No results found.  Medications: I have reviewed the patient's current medications.  Assessment/  66 y/o female with multiple medical problems including CAD, DM, ESRD on HD, colon ca s/p surgery with colostomy presents with AMS in the setting of MRSA bacteremia.  Plan:   1-Encephalopathy with MRSA bacteremia  Patient much oriented for past 2 days and back to baseline.  Blood cx on admission 05-11 ( both sets) growing MRSA, Patient was started on  IV vancomycin on 5/13.  -ID recommends 6 weeks of antibiotics from 1st negative blood culture , repeat cx ordered 5-14 no growth to date.  2D echo no evidence of vegetation. No need for TEE unless 2D echo concerning.  I spoke with Dr Daiva Eves due to high MIC, patient might be at risk for failure to therapy. Will discontinue Vancomycin and start Cubicin per pharmacy. Dr Daiva Eves ask lab for sensitivity for Cubicin. Will  consider discharge after dialysis tomorrow.   2-? UTI  - Patient Has hx of UTI in past with citrobacter and klebsiella in cx which  was pansensitive. This admission was initially thought to be due to UTI and started on ceftriaxone which was broadened to zosyn. cx  patient has significant leucocytosis and low grade temp on presentation. Started on IV ceftraixone and switched her abx to zosyn for broader coverage . Urine cx showed mixed organism and likely a contamination. Did not have any dysuria. She received 2 days of Zosyn and 1 day ceftriaxone. Will follow wbc.   3-ESRD (end stage renal disease) on dialysis  HD days are M,W,F , renal following.   4-DM (diabetes mellitus), type 2, uncontrolled, with renal complications  Patient noted to be hypoglycemic on 5/13 and given D50. fsg now stable  Cont home dose insulin 50/50. I will avoid meals coverage to avoid hypoglycemia in a dialysis patient on insulin 50/50.   5-Coronary artery disease  Cont BB, statin and ASA   6-Hx of atrial tachycardia  on amiodarone   7-Chest pain  Likely atypical and denies any symptoms at present, monitor on tele  mild treponemia possible related to demand ischemia. ECHO no significant changes.  Asymptomatic now. EKG left BBB old.    8-Anemia  Baseline Hb around 8-9 , 7.7 at present. IV iron with dialysis.  DVT prophylaxis  Sq lovenox  Diet; renal/ diabetic  Full code   Consult ; Dr Luciana Axe  Pending issues  2D echo.  Follow up blood cx         LOS: 5 days   Wilson Sample M.D.  [facility] 05/25/2011, 4:40 PM

## 2011-05-25 NOTE — Progress Notes (Signed)
ANTIBIOTIC CONSULT NOTE - INITIAL  Pharmacy Consult for  Daptomycin Indication:  Resistant Staph Aureus Bacteremia  Allergies  Allergen Reactions  . Ace Inhibitors   . Enalapril   . Lisinopril     REACTION: unspecified  . Omnipaque (Iohexol)     Patient Measurements: Height: 5' (152.4 cm) Weight: 205 lb 11 oz (93.3 kg) IBW/kg (Calculated) : 45.5  Adjusted Body Weight: 60 kg  Vital Signs: Temp: 97.7 F (36.5 C) (05/16 1707) Temp src: Oral (05/16 1707) BP: 124/74 mmHg (05/16 1707) Pulse Rate: 79  (05/16 1707) Intake/Output from previous day: 05/15 0701 - 05/16 0700 In: 120 [P.O.:120] Out: 2070 [Urine:20; Stool:50] Intake/Output from this shift: Total I/O In: 440 [P.O.:440] Out: -   Labs:  Basename 05/25/11 0525 05/24/11 1328  WBC 13.7* 17.8*  HGB 7.6* 7.3*  PLT 366 418*  LABCREA -- --  CREATININE -- 4.75*    Microbiology: Recent Results (from the past 720 hour(s))  CULTURE, BLOOD (ROUTINE X 2)     Status: Normal   Collection Time   05/20/11  8:39 PM      Component Value Range Status Comment   Specimen Description BLOOD RIGHT HAND   Final    Special Requests BOTTLES DRAWN AEROBIC AND ANAEROBIC 10CC EA   Final    Culture  Setup Time [id number]   Final    Culture     Final    Value: STAPHYLOCOCCUS AUREUS     Note: SUSCEPTIBILITIES PERFORMED ON PREVIOUS CULTURE WITHIN THE LAST 5 DAYS.     Note: Gram Stain Report Called to,Read Back By and Verified With: RN VICKY M. ON 05/21/11 AT 1930 BY TEDAR   Report Status 05/23/2011 FINAL   Final   CULTURE, BLOOD (ROUTINE X 2)     Status: Normal   Collection Time   05/20/11  8:46 PM      Component Value Range Status Comment   Specimen Description BLOOD RIGHT ARM   Final    Special Requests BOTTLES DRAWN AEROBIC ONLY 8CC   Final    Culture  Setup Time [id number]   Final    Culture     Final    Value: METHICILLIN RESISTANT STAPHYLOCOCCUS AUREUS     Note: RIFAMPIN AND GENTAMICIN SHOULD NOT BE USED AS SINGLE DRUGS FOR  TREATMENT OF STAPH INFECTIONS. CRITICAL RESULT CALLED TO, READ BACK BY AND VERIFIED WITH: NANCY BINDHAMMER 05/23/11 0750 BY SMITHERSJ     Note: Gram Stain Report Called to,Read Back By and Verified With: RN VICKY M. ON 05/21/11 AT 1930 BY TEDAR   Report Status 05/23/2011 FINAL   Final    Organism ID, Bacteria METHICILLIN RESISTANT STAPHYLOCOCCUS AUREUS   Final   URINE CULTURE     Status: Normal   Collection Time   05/20/11 10:19 PM      Component Value Range Status Comment   Specimen Description URINE, RANDOM   Final    Special Requests NONE   Final    Culture  Setup Time [id number]   Final    Colony Count >=100,000 COLONIES/ML   Final    Culture     Final    Value: Multiple bacterial morphotypes present, none predominant. Suggest appropriate recollection if clinically indicated.   Report Status 05/22/2011 FINAL   Final   URINE CULTURE     Status: Normal   Collection Time   05/21/11  2:46 AM      Component Value Range Status Comment   Specimen Description URINE, RANDOM  Final    Special Requests NONE   Final    Culture  Setup Time [id number]   Final    Colony Count >=100,000 COLONIES/ML   Final    Culture     Final    Value: Multiple bacterial morphotypes present, none predominant. Suggest appropriate recollection if clinically indicated.   Report Status 05/22/2011 FINAL   Final   MRSA PCR SCREENING     Status: Abnormal   Collection Time   05/21/11  3:02 AM      Component Value Range Status Comment   MRSA by PCR POSITIVE (*) NEGATIVE  Final   CULTURE, BLOOD (ROUTINE X 2)     Status: Normal (Preliminary result)   Collection Time   05/23/11  7:56 PM      Component Value Range Status Comment   Specimen Description BLOOD RIGHT HAND   Final    Special Requests BOTTLES DRAWN AEROBIC ONLY 5CC   Final    Culture  Setup Time [id number]   Final    Culture     Final    Value:        BLOOD CULTURE RECEIVED NO GROWTH TO DATE CULTURE WILL BE HELD FOR 5 DAYS BEFORE ISSUING A FINAL NEGATIVE  REPORT   Report Status PENDING   Incomplete   CULTURE, BLOOD (ROUTINE X 2)     Status: Normal (Preliminary result)   Collection Time   05/23/11  8:42 PM      Component Value Range Status Comment   Specimen Description BLOOD RIGHT HAND   Final    Special Requests BOTTLES DRAWN AEROBIC ONLY 5CC   Final    Culture  Setup Time [id number]   Final    Culture     Final    Value:        BLOOD CULTURE RECEIVED NO GROWTH TO DATE CULTURE WILL BE HELD FOR 5 DAYS BEFORE ISSUING A FINAL NEGATIVE REPORT   Report Status PENDING   Incomplete     Medical History: Past Medical History  Diagnosis Date  . LBBB (left bundle branch block)   . Other and unspecified hyperlipidemia   . Mitral valve insufficiency and aortic valve insufficiency   . Cardiomyopathy- mixed   . Unspecified essential hypertension   . Type II or unspecified type diabetes mellitus without mention of complication, not stated as uncontrolled   . Personal history of malignant neoplasm of large intestine   . Renal failure     ESRD, Dr Eliott Nine  . CHF (congestive heart failure)   . Stroke   . Anemia   . Cancer     history of colon cancer  . Coronary artery disease     severely decreased EF.   [name] Atrial tachycardia     on amiod    Medications:  Prescriptions prior to admission  Medication Sig Dispense Refill  . amiodarone (PACERONE) 200 MG tablet Tuesday, Thursday, Saturday, Sunday      . aspirin 81 MG chewable tablet Chew 81 mg by mouth daily.        . calcium acetate (PHOSLO) 667 MG capsule Take 2,001 mg by mouth 3 (three) times daily. Take 2 capsules in the middle of each meal each day      . insulin lispro protamine-insulin lispro (HUMALOG 50/50) (50-50) 100 UNIT/ML SUSP Inject 20 Units into the skin daily with breakfast.       . isosorbide mononitrate (IMDUR) 60 MG 24 hr tablet Take 60 mg by mouth daily.      [name]  metoprolol (LOPRESSOR) 100 MG tablet Take 50 mg by mouth 2 (two) times daily. Non dialysis days only       .  simvastatin (ZOCOR) 10 MG tablet Take 10 mg by mouth daily.      [name] terbinafine (LAMISIL) 250 MG tablet Take 250 mg by mouth daily. Non-dialysis days only      . Ostomy Supplies (ACTIVE LIFE CONVEX 1-PC ) POUCH MISC 1 Device by Does not apply route every 3 (three) days.  30 each  5  . Ostomy Supplies (NATURA DURAHESIVE MOLDABLE) WAFR 1 Wafer by Does not apply route 3 (three) times a week.  30 Wafer  5  . Ostomy Supplies (SUR-FIT NATURA UROSTOMY POUCH) POUCH MISC 1 Device by Does not apply route every 3 (three) days.  30 each  5   Assessment: 66 yo female with Staph Aureus bacteremia somewhat resistance to Vancomycin (MIC = 2), to be started on IV Daptomycin.  She continues to have some leukocytosis with WBC at 13.7K today but down from 17.8K yesterday.  No noted fever and vital signs appear stable.  Spoke with patient and she denies allergies to antibiotics.  Will start therapy tonight with dose of 6mg /kg and then with HD on MWF.  Blood cultures drawn on 05/23/2011 reveal no growth to date but are pending.  Goal of Therapy:  Clinical Response to Daptomycin  Plan:  Daptomycin 6mg /kg x 1 tonight Daptomycin 6mg /kg with each HD session Monitor culture data and sensitivites.  Nadara Mustard, PharmD., MS Clinical Pharmacist Pager:  [phone] Thank you for allowing pharmacy to be part of this patients care. 05/25/2011,5:20 PM

## 2011-05-25 NOTE — Progress Notes (Signed)
Subjective:  Feeling great this am; questioning discharge plans  Vital signs in last 24 hours: Filed Vitals:   05/24/11 1736 05/24/11 2230 05/25/11 0528 05/25/11 0918  BP: 116/47 109/64 98/51 125/48  Pulse: 67 77 70 67  Temp: 97 F (36.1 C) 97.8 F (36.6 C) 98.1 F (36.7 C) 97.8 F (36.6 C)  TempSrc: Oral Oral Oral Oral  Resp: 23 19 18 18   Height:      Weight: 95.6 kg (210 lb 12.2 oz) 93.3 kg (205 lb 11 oz)    SpO2: 100% 100% 97% 96%   Weight change: 4.3 kg (9 lb 7.7 oz)  Intake/Output Summary (Last 24 hours) at 05/25/11 1053 Last data filed at 05/25/11 0918  Gross per 24 hour  Intake    240 ml  Output   2070 ml  Net  -1830 ml   Labs: Basic Metabolic Panel:  Lab 05/24/11 1610 05/22/11 0523 05/21/11 0715  NA 129* 138 137  K 3.7 3.9 3.2*  CL 93* 96 93*  CO2 26 20 29   GLUCOSE 190* <20* 289*  BUN 29* 35* 26*  CREATININE 4.75* 5.55* 5.07*  CALCIUM 8.2* 8.3* 8.9  ALB -- -- --  PHOS 1.3* -- 3.3   Liver Function Tests:  Lab 05/24/11 1328 05/21/11 0715 05/20/11 2016  AST -- 10 10  ALT -- 7 6  ALKPHOS -- 124* 140*  BILITOT -- 0.4 0.5  PROT -- 7.3 7.8  ALBUMIN 2.1* 2.4* 2.7*   No results found for this basename: LIPASE:3,AMYLASE:3 in the last 168 hours No results found for this basename: AMMONIA:3 in the last 168 hours CBC:  Lab 05/25/11 0525 05/24/11 1328 05/22/11 0523 05/21/11 0715 05/20/11 2016  WBC 13.7* 17.8* 13.3* -- --  NEUTROABS -- -- -- -- 17.4*  HGB 7.6* 7.3* 7.7* -- --  HCT 26.4* 25.1* 27.4* -- --  MCV 81.2 77.7* 80.8 80.4 82.2  PLT 366 418* 321 -- --   Cardiac Enzymes: No results found for this basename: CKTOTAL:5,CKMB:5,CKMBINDEX:5,TROPONINI:5 in the last 168 hours CBG:  Lab 05/25/11 0739 05/24/11 1850 05/24/11 1144 05/24/11 0854 05/23/11 2135  GLUCAP 192* 111* 179* 219* 223*    Iron Studies: No results found for this basename: IRON,TIBC,TRANSFERRIN,FERRITIN in the last 72 hours Studies/Results: No results found. Medications:      .  amiodarone  200 mg Oral Daily  . aspirin  81 mg Oral Daily  . Chlorhexidine Gluconate Cloth  6 each Topical Q0600  . darbepoetin (ARANESP) injection - DIALYSIS  200 mcg Intravenous Q Mon-HD  . docusate sodium  100 mg Oral BID  . enoxaparin (LOVENOX) injection  30 mg Subcutaneous Q24H  . ferric gluconate (FERRLECIT/NULECIT) IV  125 mg Intravenous Q M,W,F-HD  . insulin lispro protamine-insulin lispro  20 Units Subcutaneous Q breakfast  . isosorbide mononitrate  60 mg Oral Daily  . metoprolol  50 mg Oral BID  . mupirocin ointment  1 application Nasal BID  . paricalcitol  1 mcg Intravenous Q M,W,F-HD  . simvastatin  10 mg Oral q1800  . sodium chloride  3 mL Intravenous Q12H  . sodium chloride  3 mL Intravenous Q12H  . terbinafine  250 mg Oral Daily  . vancomycin  1,000 mg Intravenous Q M,W,F-HD  . DISCONTD: calcium acetate  2,001 mg Oral TID  . DISCONTD: insulin aspart  0-5 Units Subcutaneous QHS  . DISCONTD: insulin aspart  0-9 Units Subcutaneous TID WC    I  have reviewed scheduled and prn medications.  Physical Exam:  General: comfortable  Heart: distant S1, S2  Lungs: diminished bilat without adventitious B.S.  Abdomen: obese, soft, Left LQ ostomy, stoma pink; +T/B  Extremities: no ankle edema  Dialysis Access: LUA AVF +T/B   Problem/Plan:  1. N & V/AMS - resolving without further c/o nausea and at baseline mental status today; was likely secondary to infection (UTI and/or Bacteremia); remains on IV Zosyn and Vancomycin  2. MRSA Bacteremia- uncertain origin. On Vanc with ID following; Echo yesterday w/ E.F. 55%, no vegatation identified, nor changes from prior study Jan 2013; ID following; wBC down 13.7 and remains afebrile; follow closely noting recommendations daptomycin if becomes febrile with ^ WBC  3. ESRD - HD on MWF @ [place]; next HD tomorrow on 4K bath (K level 3.7). 4. Hypertension/volume/CAD - BP controlled on meds (lopressor, imdur) and UF on HD; stable on Metoprolol 50 mg  bid; post-HD wt 93.2 yesterday (EDW 93.5 kg); follow closely 5. Anemia - Hgb ^7.6 this am; chronic low even in outpt setting; Aranesp 200 mcg today and IV Fe bolus (MWF) in progress; follow closely and transfuse if remains < 7.5 6. Metabolic bone disease - phos 1.3, calcium 8.2 on Phoslo and Zemplar; Hold Phoslo for now and cont follow ca/phos 7. Nutrition - albumin 2.1; appetite poor; just starting to eat again; cont ^ protein renal diet with Nepro suppl; follow 8. 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 9. DM/HLD - well controlled in outpt setting with HgbA1C 6.6 on 4/24, CBG and SSI; Statin therapy; primary following  10. History of atrial tachycardia - on amiodarone , BB, lovenox ASA  11. Disposition- per primary services  Samuel Germany, FNP-C [facility] Pager [phone]  05/25/2011,10:53 AM  LOS: 5 days   Patient seen and examined and agree with assessment and plan as above.  Vinson Moselle  MD [facility] [phone] pgr    [phone] cell 05/25/2011, 11:56 AM

## 2011-05-26 ENCOUNTER — Inpatient Hospital Stay (HOSPITAL_COMMUNITY): Payer: BC Managed Care – PPO

## 2011-05-26 DIAGNOSIS — A4902 Methicillin resistant Staphylococcus aureus infection, unspecified site: Secondary | ICD-10-CM

## 2011-05-26 DIAGNOSIS — R7881 Bacteremia: Secondary | ICD-10-CM

## 2011-05-26 DIAGNOSIS — N186 End stage renal disease: Secondary | ICD-10-CM

## 2011-05-26 DIAGNOSIS — E1165 Type 2 diabetes mellitus with hyperglycemia: Secondary | ICD-10-CM

## 2011-05-26 LAB — RENAL FUNCTION PANEL
Albumin: 2.1 g/dL — ABNORMAL LOW (ref 3.5–5.2)
BUN: 22 mg/dL (ref 6–23)
CO2: 26 mEq/L (ref 19–32)
Calcium: 8.2 mg/dL — ABNORMAL LOW (ref 8.4–10.5)
Chloride: 93 mEq/L — ABNORMAL LOW (ref 96–112)
Creatinine, Ser: 4.75 mg/dL — ABNORMAL HIGH (ref 0.50–1.10)
GFR calc Af Amer: 10 mL/min — ABNORMAL LOW (ref >90)
GFR calc non Af Amer: 9 mL/min — ABNORMAL LOW (ref >90)
Glucose, Bld: 201 mg/dL — ABNORMAL HIGH (ref 70–99)
Phosphorus: 1.9 mg/dL — ABNORMAL LOW (ref 2.3–4.6)
Potassium: 4 mEq/L (ref 3.5–5.1)
Sodium: 130 mEq/L — ABNORMAL LOW (ref 135–145)

## 2011-05-26 LAB — TYPE AND SCREEN
ABO/RH(D): AB POS
Antibody Screen: NEGATIVE

## 2011-05-26 LAB — GLUCOSE, CAPILLARY
Glucose-Capillary: 208 mg/dL — ABNORMAL HIGH (ref 70–99)
Glucose-Capillary: 98 mg/dL (ref 70–99)

## 2011-05-26 LAB — CBC
MCH: 22.9 pg — ABNORMAL LOW (ref 26.0–34.0)
MCHC: 28.4 g/dL — ABNORMAL LOW (ref 30.0–36.0)
Platelets: 379 10*3/uL (ref 150–400)

## 2011-05-26 MED ORDER — SODIUM CHLORIDE 0.9 % IV SOLN
8.0000 mg/kg | INTRAVENOUS | Status: DC
  Filled 2011-05-26: qty 15.14

## 2011-05-26 MED ORDER — SODIUM CHLORIDE 0.9 % IV SOLN: 6.0000 mg/kg | INTRAVENOUS | Status: DC

## 2011-05-26 MED ORDER — PARICALCITOL 5 MCG/ML IV SOLN
INTRAVENOUS | Status: AC
  Administered 2011-05-26: 1 ug via INTRAVENOUS
  Filled 2011-05-26: qty 1

## 2011-05-26 NOTE — Progress Notes (Signed)
PT Cancellation Note  Treatment cancelled today due to patient receiving procedure or test.  Patient unavailable due to in hemodialysis.  Also note plans for discharge today.  Will try again later date if not discharged.  Thanks.  Adylynn Hertenstein,CYNDI 05/26/2011, 4:18 PM

## 2011-05-26 NOTE — Discharge Summary (Addendum)
Physician Discharge Summary  [name] ZOX:[id number] DOB: 1945/10/08 DOA: 05/20/2011  PCP: Sonda Primes, MD, MD  Admit date: 05/20/2011 Discharge date: 05/26/2011  Discharge Diagnoses:  -Encephalopathy with MRSA bacteremia UTI (lower urinary tract infection) LBBB ESRD (end stage renal disease) on dialysis DM (diabetes mellitus), type 2, uncontrolled, with renal complications Coronary artery disease  Encephalopathy  Anemia  Elevated troponin in renal failure patient.    Discharge Condition: improved.   Disposition: Needs to follow up MRSA sensitive to Daptomycin.   History of present illness:  Presented with  Patient herself unable to provide any history pain or emergency department physician history was obtained from her daughter stated that patient has been confused. Have had bloody urine coming out of her urostomy. She had poor by mouth intake. Have had nausea and vomiting for the past month at least. Low-grade fevers. Patient is status post iliel conduit as well as colostomy done in 1980's for colon cancer. Patient herself unable to provide any history whatsoever she is only moaning. She did him shake her head "no" staiting she has no pain   Hospital Course:  1-Encephalopathy with MRSA bacteremia  Patient much oriented for past 2 days and back to baseline. Encephalopathy metabolic in setting of infection.  Blood cx on admission 05-11 ( both sets) growing MRSA, Patient was started on IV vancomycin on 5/13.  -ID recommends 6 weeks of antibiotics from 1st negative blood culture , repeat cx ordered 5-14 no growth to date. 2D echo no evidence of vegetation. No need for TEE unless 2D echo concerning. I spoke with Dr Daiva Eves due to high MIC, patient might be at risk for failure to therapy. Will discontinue Vancomycin and start Cubicin per pharmacy. Dr Daiva Eves ask lab for sensitivity for Cubicin. Will consider discharge Patient home after dialysis.   2-? UTI  - Patient Has hx of  UTI in past with citrobacter and klebsiella in cx which was pansensitive. This admission was initially thought to be due to UTI and started on ceftriaxone which was broadened to zosyn.  Started on IV ceftraixone and switched her abx to zosyn for broader coverage . Urine cx showed mixed organism and likely a contamination. Did not have any dysuria. She received 2 days of Zosyn and 1 day ceftriaxone.  3-ESRD (end stage renal disease) on dialysis  HD days are M,W,F , renal following.  4-DM (diabetes mellitus), type 2, uncontrolled, with renal complications  Patient noted to be hypoglycemic on 5/13 and given D50. fsg now stable  Cont home dose insulin 50/50. I will avoid meals coverage to avoid hypoglycemia in a dialysis patient on insulin 50/50.  5-Coronary artery disease  Cont BB, statin and ASA  6-Hx of atrial tachycardia  on amiodarone  7-Chest pain  Likely atypical and denies any symptoms at present, Resolved.  mild treponemia possible related to demand ischemia and renal failure patient. ECHO no significant changes.  Asymptomatic now. EKG left BBB old. Needs to follow up with cardiology.  8-Anemia  Baseline Hb around 8-9 , 7.7 at present. IV iron with dialysis.    Consult ; Dr Luciana Axe         Discharge Exam: Filed Vitals:   05/26/11 0858  BP: 137/57  Pulse: 82  Temp: 98.1 F (36.7 C)  Resp: 18   Filed Vitals:   05/25/11 1707 05/25/11 2048 05/26/11 0520 05/26/11 0858  BP: 124/74 114/41 115/40 137/57  Pulse: 79 74 75 82  Temp: 97.7 F (36.5 C) 98.5  F (36.9 C) 98.2 F (36.8 C) 98.1 F (36.7 C)  TempSrc: Oral Oral Oral Oral  Resp: 18 18 17 18   Height:      Weight:  94.6 kg (208 lb 8.9 oz)    SpO2: 98% 97% 100% 100%   General: Patient feeling well. Cardiovascular: S1, S2, RRR. Respiratory: CTA. Neuro: alert and oriented, non focal.   Discharge Instructions  Discharge Orders    Future Orders Please Complete By Expires   Diet - low sodium heart healthy       Increase activity slowly        Medication List  As of 05/26/2011 12:27 PM   TAKE these medications         amiodarone 200 MG tablet   Commonly known as: PACERONE   Tuesday, Thursday, Saturday, Sunday      aspirin 81 MG chewable tablet   Chew 81 mg by mouth daily.      calcium acetate 667 MG capsule   Commonly known as: PHOSLO   Take 2,001 mg by mouth 3 (three) times daily. Take 2 capsules in the middle of each meal each day      insulin lispro protamine-insulin lispro (50-50) 100 UNIT/ML Susp   Commonly known as: HUMALOG 50/50   Inject 20 Units into the skin daily with breakfast.      isosorbide mononitrate 60 MG 24 hr tablet   Commonly known as: IMDUR   Take 60 mg by mouth daily.      metoprolol 100 MG tablet   Commonly known as: LOPRESSOR   Take 50 mg by mouth 2 (two) times daily. Non dialysis days only      simvastatin 10 MG tablet   Commonly known as: ZOCOR   Take 10 mg by mouth daily.      sodium chloride 0.9 % SOLN 100 mL with DAPTOmycin 500 MG SOLR 387.5 mg   Inject 750 mg into the vein every Monday, Wednesday, and Friday with hemodialysis.      SUR-FIT NATURA UROSTOMY POUCH POUCH Misc   1 Device by Does not apply route every 3 (three) days.      ACTIVE LIFE CONVEX 1-PC POUCH Misc   1 Device by Does not apply route every 3 (three) days.      NATURA DURAHESIVE MOLDABLE Wafr   1 Wafer by Does not apply route 3 (three) times a week.      terbinafine 250 MG tablet   Commonly known as: LAMISIL   Take 250 mg by mouth daily. Non-dialysis days only              The results of significant diagnostics from this hospitalization (including imaging, microbiology, ancillary and laboratory) are listed below for reference.    Significant Diagnostic Studies: Ct Abdomen Pelvis Wo Contrast  05/21/2011  *RADIOLOGY REPORT*  Clinical Data: Weight loss.  Nausea and vomiting.  The  CT ABDOMEN AND PELVIS WITHOUT CONTRAST  Technique:  Multidetector CT imaging of the  abdomen and pelvis was performed following the standard protocol without intravenous contrast.  Comparison: 10/28/2009  Findings: Atelectasis in the lung bases.  Layering of multiple stones in the gallbladder, similar previous study.  Low attenuation lesion in the spleen is stable since the previous study.  There is a large left anterior abdominal wall hernia arising between the rectus abdominous and flank muscles. The hernia contains colon, small bowel, and fat and appears to represent a peristomal hernia.  This appears stable since the previous  study.  There is a midline abdominal hernia below the level in the emboli this which appears represent a peristomal hernia arising from an ileal conduit.  Infiltration or edema in the subcutaneous fat.  The unenhanced liver is unremarkable.  Postoperative changes with bladder resection and right lower quadrant ileal conduit.  The left ureter is distended with.  Renal stranding on the left suggesting obstruction.  No significant obstruction of the right kidney. Descending colostomy in the left lower quadrant.  Calcification of the abdominal aorta without aneurysm.  Pelvis:  No free or loculated pelvic fluid collections.  No significant pelvic lymphadenopathy.  Degenerative changes in the lumbar spine.  IMPRESSION: No other postoperative changes with resection of the bladder and right lower quadrant ileal conduit.  Suggestion of left ureteral obstruction.  Left lower quadrant colostomy.  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  No evidence of bowel obstruction.  Cholelithiasis.  Stable splenic lesion.  Original Report Authenticated By: Marlon Pel, M.D.   Dg Chest 2 View  05/20/2011  *RADIOLOGY REPORT*  Clinical Data: Vomiting and altered mental status.  CHEST - 2 VIEW  Comparison: 10/05/2010  Findings: Shallow inspiration.  Cardiac enlargement with normal pulmonary vascularity for technique.  No focal airspace consolidation in the lungs.   No blunting of costophrenic angles. No pneumothorax.  Degenerative changes in the spine.  No significant change since previous study.  Motion artifact.  IMPRESSION: Cardiac enlargement without vascular congestion or edema.  Original Report Authenticated By: Marlon Pel, M.D.   Ct Head Wo Contrast  05/20/2011  *RADIOLOGY REPORT*  Clinical Data: Altered mental status and vomiting after eating. Diabetes.  End-stage renal disease.  CT HEAD WITHOUT CONTRAST  Technique:  Contiguous axial images were obtained from the base of the skull through the vertex without contrast.  Comparison: MRI 01/09/2010.  CT 01/08/2010  Findings: Technically limited study due to motion artifact.  There is diffuse cerebral atrophy.  Low attenuation changes throughout the deep and periventricular white matter consistent with small vessel ischemia.  Previously identified left thalamic infarct is less well visualized today.  Suggestion of some low attenuation change in the pons which might represent small vessel ischemia. Similar changes were present on the previous MRI.  No mass effect or midline shift.  No abnormal extra-axial fluid collections.  Wallace Cullens- white matter junctions are distinct.  Basal cisterns are not effaced.  No evidence of acute intracranial hemorrhage.  No depressed skull fractures.  Visualized paranasal sinuses demonstrate retention cyst in the left maxillary antrum.  Mastoid air cells are not opacified.  Vascular calcifications.  IMPRESSION: Chronic atrophy and small vessel ischemic changes.  No acute intracranial abnormalities.  Original Report Authenticated By: Marlon Pel, M.D.    Microbiology: Recent Results (from the past 240 hour(s))  CULTURE, BLOOD (ROUTINE X 2)     Status: Normal   Collection Time   05/20/11  8:39 PM      Component Value Range Status Comment   Specimen Description BLOOD RIGHT HAND   Final    Special Requests BOTTLES DRAWN AEROBIC AND ANAEROBIC 10CC EA   Final    Culture  Setup  Time [id number]   Final    Culture     Final    Value: STAPHYLOCOCCUS AUREUS     Note: SUSCEPTIBILITIES PERFORMED ON PREVIOUS CULTURE WITHIN THE LAST 5 DAYS.     Note: Gram Stain Report Called to,Read Back By and Verified With: RN VICKY M. ON 05/21/11  AT 1930 BY TEDAR   Report Status 05/23/2011 FINAL   Final   CULTURE, BLOOD (ROUTINE X 2)     Status: Normal   Collection Time   05/20/11  8:46 PM      Component Value Range Status Comment   Specimen Description BLOOD RIGHT ARM   Final    Special Requests BOTTLES DRAWN AEROBIC ONLY 8CC   Final    Culture  Setup Time [id number]   Final    Culture     Final    Value: METHICILLIN RESISTANT STAPHYLOCOCCUS AUREUS     Note: RIFAMPIN AND GENTAMICIN SHOULD NOT BE USED AS SINGLE DRUGS FOR TREATMENT OF STAPH INFECTIONS. CRITICAL RESULT CALLED TO, READ BACK BY AND VERIFIED WITH: NANCY BINDHAMMER 05/23/11 0750 BY SMITHERSJ     Note: Gram Stain Report Called to,Read Back By and Verified With: RN VICKY M. ON 05/21/11 AT 1930 BY TEDAR   Report Status 05/23/2011 FINAL   Final    Organism ID, Bacteria METHICILLIN RESISTANT STAPHYLOCOCCUS AUREUS   Final   URINE CULTURE     Status: Normal   Collection Time   05/20/11 10:19 PM      Component Value Range Status Comment   Specimen Description URINE, RANDOM   Final    Special Requests NONE   Final    Culture  Setup Time [id number]   Final    Colony Count >=100,000 COLONIES/ML   Final    Culture     Final    Value: Multiple bacterial morphotypes present, none predominant. Suggest appropriate recollection if clinically indicated.   Report Status 05/22/2011 FINAL   Final   URINE CULTURE     Status: Normal   Collection Time   05/21/11  2:46 AM      Component Value Range Status Comment   Specimen Description URINE, RANDOM   Final    Special Requests NONE   Final    Culture  Setup Time [id number]   Final    Colony Count >=100,000 COLONIES/ML   Final    Culture     Final    Value: Multiple bacterial  morphotypes present, none predominant. Suggest appropriate recollection if clinically indicated.   Report Status 05/22/2011 FINAL   Final   MRSA PCR SCREENING     Status: Abnormal   Collection Time   05/21/11  3:02 AM      Component Value Range Status Comment   MRSA by PCR POSITIVE (*) NEGATIVE  Final   CULTURE, BLOOD (ROUTINE X 2)     Status: Normal (Preliminary result)   Collection Time   05/23/11  7:56 PM      Component Value Range Status Comment   Specimen Description BLOOD RIGHT HAND   Final    Special Requests BOTTLES DRAWN AEROBIC ONLY 5CC   Final    Culture  Setup Time [id number]   Final    Culture     Final    Value:        BLOOD CULTURE RECEIVED NO GROWTH TO DATE CULTURE WILL BE HELD FOR 5 DAYS BEFORE ISSUING A FINAL NEGATIVE REPORT   Report Status PENDING   Incomplete   CULTURE, BLOOD (ROUTINE X 2)     Status: Normal (Preliminary result)   Collection Time   05/23/11  8:42 PM      Component Value Range Status Comment   Specimen Description BLOOD RIGHT HAND   Final    Special Requests BOTTLES DRAWN AEROBIC ONLY 5CC  Final    Culture  Setup Time [id number]   Final    Culture     Final    Value:        BLOOD CULTURE RECEIVED NO GROWTH TO DATE CULTURE WILL BE HELD FOR 5 DAYS BEFORE ISSUING A FINAL NEGATIVE REPORT   Report Status PENDING   Incomplete      Labs: Basic Metabolic Panel:  Lab 05/24/11 8119 05/22/11 0523 05/21/11 0715 05/20/11 2016  NA 129* 138 137 136  K 3.7 3.9 -- --  CL 93* 96 93* 92*  CO2 26 20 29 30   GLUCOSE 190* <20* 289* 320*  BUN 29* 35* 26* 20  CREATININE 4.75* 5.55* 5.07* 4.33*  CALCIUM 8.2* 8.3* 8.9 8.8  MG -- -- 2.0 --  PHOS 1.3* -- 3.3 --   Liver Function Tests:  Lab 05/24/11 1328 05/21/11 0715 05/20/11 2016  AST -- 10 10  ALT -- 7 6  ALKPHOS -- 124* 140*  BILITOT -- 0.4 0.5  PROT -- 7.3 7.8  ALBUMIN 2.1* 2.4* 2.7*   CBC:  Lab 05/25/11 0525 05/24/11 1328 05/22/11 0523 05/21/11 0715 05/20/11 2016  WBC 13.7* 17.8* 13.3* 17.9*  19.3*  NEUTROABS -- -- -- -- 17.4*  HGB 7.6* 7.3* 7.7* 7.3* 7.9*  HCT 26.4* 25.1* 27.4* 25.8* 27.7*  MCV 81.2 77.7* 80.8 80.4 82.2  PLT 366 418* 321 350 401*  CBG:  Lab 05/26/11 1143 05/26/11 0729 05/25/11 2046 05/25/11 1706 05/25/11 1141  GLUCAP 208* 198* 178* 183* 226*    Time coordinating discharge: 30 minutes.   Signed:  Myrka Sylva  [facility] 05/26/2011, 12:27 PM   Patient discharge delay because patient finished dialysis late after 7 pm and dint have transportation.  Patient stable for discharge this morning.

## 2011-05-26 NOTE — Procedures (Signed)
I was present at this dialysis session. I have reviewed the session itself and made appropriate changes.   Vinson Moselle, MD [facility] 05/26/2011, 2:54 PM

## 2011-05-26 NOTE — Progress Notes (Signed)
[facility] Progress Note  Subjective: Feels better; appetite the best it has been in several months.  Hopes to go home today.  Objective Filed Vitals:   05/25/11 1707 05/25/11 2048 05/26/11 0520 05/26/11 0858  BP: 124/74 114/41 115/40 137/57  Pulse: 79 74 75 82  Temp: 97.7 F (36.5 C) 98.5 F (36.9 C) 98.2 F (36.8 C) 98.1 F (36.7 C)  TempSrc: Oral Oral Oral Oral  Resp: 18 18 17 18   Height:      Weight:  94.6 kg (208 lb 8.9 oz)    SpO2: 98% 97% 100% 100%   Physical Exam General: NAD; alert and talkative Heart: RRR Lungs: no rales or rhonchi appreciated Abdomen: obese soft; left LQ ostomy intact Extremities: no LE edema Dialysis Access: left upper AVF + bruit  Dialysis Orders: Center: [facility] on MWF .  EDW 93.5 kg HD Bath 2K/2.5Ca Time 4 hrs Heparin none. Access AVF @ LUA BFR 400 DFR 800  Zemplar 1 mcg IV/HD Epogen 28,000 Units IV/HD Venofer 100 mg per HD   Assessment/Plan: 1. MRSA bacteremia; neg veg on echo; ? Etiology;- urine culture > 100K non predominant; repeat [facility] 5/14 no growth pending; changed to daptomycin - first dose today. Her HD center has four doses available and can order more.  Please verify dose and duration at discharge. 2. ESRD - MWF - HD today per routine 3. Anemia - chronic low; on max ESA and IV Fe; transfuse if Hgb < 7.5 4. Secondary hyperparathyroidism - phos 1.3, calcium 8.2 on Phoslo and Zemplar; phoslo was d/c; recheck today 5. HTN/volume - BP controlled on meds (lopressor, imdur) and UF on HD; stable on Metoprolol 50 mg bid; post-HD wt 93.2 yesterday (EDW 93.5 kg); follow closely 6. Nutrition - low albumin - high protein diet + supplements 7. History of atrial tachycardia - on amiodarone , BB, lovenox ASA  8. Disposition- per primary services - poss d/c today;  Additional Objective Labs: Basic Metabolic Panel:  Lab 05/24/11 1610 05/22/11 0523 05/21/11 0715  NA 129* 138 137  K 3.7 3.9 3.2*  CL 93* 96 93*  CO2 26 20 29     GLUCOSE 190* <20* 289*  BUN 29* 35* 26*  CREATININE 4.75* 5.55* 5.07*  CALCIUM 8.2* 8.3* 8.9  ALB -- -- --  PHOS 1.3* -- 3.3   Liver Function Tests:  Lab 05/24/11 1328 05/21/11 0715 05/20/11 2016  AST -- 10 10  ALT -- 7 6  ALKPHOS -- 124* 140*  BILITOT -- 0.4 0.5  PROT -- 7.3 7.8  ALBUMIN 2.1* 2.4* 2.7*  CBC:  Lab 05/25/11 0525 05/24/11 1328 05/22/11 0523 05/21/11 0715 05/20/11 2016  WBC 13.7* 17.8* 13.3* -- --  NEUTROABS -- -- -- -- 17.4*  HGB 7.6* 7.3* 7.7* -- --  HCT 26.4* 25.1* 27.4* -- --  MCV 81.2 77.7* 80.8 80.4 82.2  PLT 366 418* 321 -- --   Blood Culture    Component Value Date/Time   SDES BLOOD RIGHT HAND 05/23/2011 2042   SPECREQUEST BOTTLES DRAWN AEROBIC ONLY 5CC 05/23/2011 2042   CULT        BLOOD CULTURE RECEIVED NO GROWTH TO DATE CULTURE WILL BE HELD FOR 5 DAYS BEFORE ISSUING A FINAL NEGATIVE REPORT 05/23/2011 2042   REPTSTATUS PENDING 05/23/2011 2042   CBG:  Lab 05/26/11 0729 05/25/11 2046 05/25/11 1706 05/25/11 1141 05/25/11 0739  GLUCAP 198* 178* 183* 226* 192*  Medications:      . amiodarone  200 mg Oral  Daily  . aspirin  81 mg Oral Daily  . Chlorhexidine Gluconate Cloth  6 each Topical Q0600  . DAPTOmycin (CUBICIN)  IV  6 mg/kg (Adjusted) Intravenous Once  . DAPTOmycin (CUBICIN)  IV  6 mg/kg (Adjusted) Intravenous Q M,W,F-HD  . darbepoetin (ARANESP) injection - DIALYSIS  200 mcg Intravenous Q Mon-HD  . docusate sodium  100 mg Oral BID  . enoxaparin (LOVENOX) injection  30 mg Subcutaneous Q24H  . ferric gluconate (FERRLECIT/NULECIT) IV  125 mg Intravenous Q M,W,F-HD  . insulin lispro protamine-insulin lispro  20 Units Subcutaneous Q breakfast  . isosorbide mononitrate  60 mg Oral Daily  . metoprolol  50 mg Oral BID  . mupirocin ointment  1 application Nasal BID  . paricalcitol  1 mcg Intravenous Q M,W,F-HD  . simvastatin  10 mg Oral q1800  . sodium chloride  3 mL Intravenous Q12H  . sodium chloride  3 mL Intravenous Q12H  . terbinafine  250  mg Oral Daily  . DISCONTD: vancomycin  1,000 mg Intravenous Q M,W,F-HD    I  have reviewed scheduled and prn medications.  Sheffield Slider, PA-C [facility] [phone]  05/26/2011,9:26 AM  LOS: 6 days   Patient seen and examined and agree with assessment and plan as above.  Cynthia Moselle  MD [facility] [phone] pgr    [phone] cell 05/26/2011, 3:02 PM

## 2011-05-26 NOTE — Discharge Instructions (Signed)
Kidney Failure Kidney failure happens when the kidneys cannot remove waste and excess fluid that naturally builds up in your blood after your body breaks down food. This leads to a dangerous buildup of waste products and fluid in the blood. HOME CARE  Follow your diet as told by your doctor.   Take all medicines as told by your doctor.   Keep all of your dialysis appointments. Call if you are unable to keep an appointment.  GET HELP RIGHT AWAY IF:   You make a lot more or very little pee (urine).   Your face or ankles puff up (swell).   You develop shortness of breath.   You develop weakness, feel tired, or you do not feel hungry (appetite loss).   You feel poorly for no known reason.  MAKE SURE YOU:   Understand these instructions.   Will watch your condition.   Will get help right away if you are not doing well or get worse.  Document Released: 03/22/2009 Document Revised: 12/15/2010 Document Reviewed: 04/28/2009 [facility] Patient Information 2012 [place], [place].

## 2011-05-26 NOTE — Progress Notes (Signed)
Daptomycin protocol:  Due to the possibility of increase MIC to dapto, I have adjust her dose to the higher end. She'll be getting 8mg /kg/dose. To be give 3x/wk after each HD session.  Plan:  Daptomycin 750mg  IV qhd

## 2011-05-26 NOTE — Progress Notes (Signed)
Pt off unit

## 2011-05-27 LAB — CULTURE, BLOOD (ROUTINE X 2): Culture  Setup Time: 201305120204

## 2011-05-27 MED ORDER — SODIUM CHLORIDE 0.9 % IV SOLN: 8.0000 mg/kg | INTRAVENOUS | Status: DC

## 2011-05-27 NOTE — Progress Notes (Addendum)
[facility] Progress Note  Subjective:  Was not d/c because she felt washed out after dialysis yesterday, which is not unusual for her.  Feels better today. Ate nearly 100% breakfast  Objective Filed Vitals:   05/26/11 1838 05/26/11 1917 05/26/11 2055 05/27/11 0601  BP: 123/49 136/51 146/50 147/54  Pulse: 81 83 85 80  Temp: 97.8 F (36.6 C) 98.1 F (36.7 C) 98.5 F (36.9 C) 97.9 F (36.6 C)  TempSrc: Oral Oral Oral Oral  Resp: 20 17 18 18   Height:      Weight: 93.7 kg (206 lb 9.1 oz)  92.9 kg (204 lb 12.9 oz)   SpO2: 100% 99% 100% 100%   Physical Exam General: NAD; alert and talkative  Heart: RRR distant Lungs: no rales or rhonchi appreciated  Abdomen: obese soft; Extremities: no LE edema  Dialysis Access: left upper AVF + bruit   Dialysis Orders: Center: [facility] on MWF .  EDW 93.5 kg HD Bath 2K/2.5Ca Time 4 hrs Heparin none. Access AVF @ LUA BFR 400 DFR 800  Zemplar 1 mcg IV/HD Epogen 28,000 Units IV/HD Venofer 100 mg per HD   Assessment/Plan:  1. MRSA bacteremia; neg veg on echo; ? Etiology;- urine culture > 100K non predominant; repeat [facility] 5/14 no growth pending; changed to daptomycin - first 5/17; ID rec 6 weeks antibiotics from 5/14 (first neg BC- final still pending);  Pharmacy has adjusted dose up to 750 mg q HD due to the possibility of increased MIC; will notify this change to her dialysis center. Pharmacy has also recommended weekly CPK due to the risk of rhabdo with daptomycin. CK was ordered yesterday and cancelled; I have reordered today before discharge. 2. ESRD - MWF - post weight yesterday was 92.9 with net UF of 3 liters; have not changed her EDW of 93.5 HD weight  3. Anemia - chronic low; on max ESA and IV Fe;Hgb7.5 yesterday - not transfused- hopefully it will trend up with resolution of infection and IV Fe. Ferritin is quite high, but I suspect inflammatory response; recheck with May labs to see it is trending down. 4. Secondary  hyperparathyroidism - phos 1.3, calcium 8.2 on Phoslo and Zemplar; phoslo was d/c; recheck today  5. HTN/volume - BP controlled on meds (lopressor, imdur) and UF on HD; stable on Metoprolol 50 mg bid; 6. Nutrition - low albumin - high protein diet + supplements  7. History of atrial tachycardia - on amiodarone , BB, lovenox ASA  8. Disposition- per primary services - d/c today;   Additional Objective Labs: Basic Metabolic Panel:  Lab 05/26/11 1610 05/24/11 1328 05/22/11 0523 05/21/11 0715  NA 130* 129* 138 --  K 4.0 3.7 3.9 --  CL 93* 93* 96 --  CO2 26 26 20  --  GLUCOSE 201* 190* <20* --  BUN 22 29* 35* --  CREATININE 4.75* 4.75* 5.55* --  CALCIUM 8.2* 8.2* 8.3* --  ALB -- -- -- --  PHOS 1.9* 1.3* -- 3.3   Liver Function Tests:  Lab 05/26/11 1506 05/24/11 1328 05/21/11 0715 05/20/11 2016  AST -- -- 10 10  ALT -- -- 7 6  ALKPHOS -- -- 124* 140*  BILITOT -- -- 0.4 0.5  PROT -- -- 7.3 7.8  ALBUMIN 2.1* 2.1* 2.4* --   CBC:  Lab 05/26/11 1506 05/25/11 0525 05/24/11 1328 05/22/11 0523 05/21/11 0715 05/20/11 2016  WBC 11.1* 13.7* 17.8* -- -- --  NEUTROABS -- -- -- -- -- 17.4*  HGB 7.5* 7.6*  7.3* -- -- --  HCT 26.4* 26.4* 25.1* -- -- --  MCV 80.5 81.2 77.7* 80.8 80.4 --  PLT 379 366 418* -- -- --   Blood Culture    Component Value Date/Time   SDES BLOOD RIGHT HAND 05/23/2011 2042   SPECREQUEST BOTTLES DRAWN AEROBIC ONLY 5CC 05/23/2011 2042   CULT        BLOOD CULTURE RECEIVED NO GROWTH TO DATE CULTURE WILL BE HELD FOR 5 DAYS BEFORE ISSUING A FINAL NEGATIVE REPORT 05/23/2011 2042   REPTSTATUS PENDING 05/23/2011 2042  CBG:  Lab 05/26/11 2100 05/26/11 1915 05/26/11 1143 05/26/11 0729 05/25/11 2046  GLUCAP 140* 98 208* 198* 178*   Lab Results  Component Value Date   IRON 16* 05/21/2011   TIBC 122* 05/21/2011   FERRITIN 2174* 05/21/2011  Medications:      . amiodarone  200 mg Oral Daily  . aspirin  81 mg Oral Daily  . DAPTOmycin (CUBICIN)  IV  8 mg/kg Intravenous Q  M,W,F-HD  . darbepoetin (ARANESP) injection - DIALYSIS  200 mcg Intravenous Q Mon-HD  . docusate sodium  100 mg Oral BID  . enoxaparin (LOVENOX) injection  30 mg Subcutaneous Q24H  . ferric gluconate (FERRLECIT/NULECIT) IV  125 mg Intravenous Q M,W,F-HD  . insulin lispro protamine-insulin lispro  20 Units Subcutaneous Q breakfast  . isosorbide mononitrate  60 mg Oral Daily  . metoprolol  50 mg Oral BID  . mupirocin ointment  1 application Nasal BID  . paricalcitol  1 mcg Intravenous Q M,W,F-HD  . simvastatin  10 mg Oral q1800  . sodium chloride  3 mL Intravenous Q12H  . sodium chloride  3 mL Intravenous Q12H  . terbinafine  250 mg Oral Daily  . DISCONTD: DAPTOmycin (CUBICIN)  IV  6 mg/kg (Adjusted) Intravenous Q M,W,F-HD    I  have reviewed scheduled and prn medications.  Sheffield Slider, PA-C [facility] Beeper [phone]  05/27/2011,8:36 AM  LOS: 7 days   Patient seen and examined and agree with assessment and plan as above.  Vinson Moselle  MD [facility] [phone] pgr    [phone] cell 05/27/2011, 10:32 AM

## 2011-05-27 NOTE — Progress Notes (Signed)
Patient discharge home with daughter.  No acute distress noted.  Copies of all forms given to patient and explained.  Patient /daughter voiced  Understanding of all instruction as explained by  Nurse.

## 2011-05-30 LAB — CULTURE, BLOOD (ROUTINE X 2)
Culture  Setup Time: 201305150435
Culture: NO GROWTH

## 2011-06-21 DIAGNOSIS — R269 Unspecified abnormalities of gait and mobility: Secondary | ICD-10-CM

## 2011-06-21 DIAGNOSIS — E119 Type 2 diabetes mellitus without complications: Secondary | ICD-10-CM | POA: Diagnosis not present

## 2011-06-21 DIAGNOSIS — M6281 Muscle weakness (generalized): Secondary | ICD-10-CM

## 2011-06-21 DIAGNOSIS — Z5189 Encounter for other specified aftercare: Secondary | ICD-10-CM

## 2011-06-22 DIAGNOSIS — E11359 Type 2 diabetes mellitus with proliferative diabetic retinopathy without macular edema: Secondary | ICD-10-CM | POA: Diagnosis not present

## 2011-06-22 DIAGNOSIS — H4310 Vitreous hemorrhage, unspecified eye: Secondary | ICD-10-CM | POA: Diagnosis not present

## 2011-07-06 DIAGNOSIS — B351 Tinea unguium: Secondary | ICD-10-CM | POA: Diagnosis not present

## 2011-07-06 DIAGNOSIS — L84 Corns and callosities: Secondary | ICD-10-CM | POA: Diagnosis not present

## 2011-07-06 DIAGNOSIS — M79609 Pain in unspecified limb: Secondary | ICD-10-CM | POA: Diagnosis not present

## 2011-07-06 DIAGNOSIS — E1059 Type 1 diabetes mellitus with other circulatory complications: Secondary | ICD-10-CM | POA: Diagnosis not present

## 2011-07-06 DIAGNOSIS — I739 Peripheral vascular disease, unspecified: Secondary | ICD-10-CM | POA: Diagnosis not present

## 2011-07-25 ENCOUNTER — Other Ambulatory Visit: Payer: Self-pay | Admitting: Internal Medicine

## 2011-08-11 DIAGNOSIS — E119 Type 2 diabetes mellitus without complications: Secondary | ICD-10-CM | POA: Diagnosis not present

## 2011-08-11 DIAGNOSIS — D509 Iron deficiency anemia, unspecified: Secondary | ICD-10-CM | POA: Diagnosis not present

## 2011-08-11 DIAGNOSIS — N2581 Secondary hyperparathyroidism of renal origin: Secondary | ICD-10-CM | POA: Diagnosis not present

## 2011-08-11 DIAGNOSIS — Z992 Dependence on renal dialysis: Secondary | ICD-10-CM | POA: Diagnosis not present

## 2011-08-11 DIAGNOSIS — N186 End stage renal disease: Secondary | ICD-10-CM | POA: Diagnosis not present

## 2011-08-14 DIAGNOSIS — E119 Type 2 diabetes mellitus without complications: Secondary | ICD-10-CM | POA: Diagnosis not present

## 2011-08-14 DIAGNOSIS — N186 End stage renal disease: Secondary | ICD-10-CM | POA: Diagnosis not present

## 2011-08-14 DIAGNOSIS — N2581 Secondary hyperparathyroidism of renal origin: Secondary | ICD-10-CM | POA: Diagnosis not present

## 2011-08-14 DIAGNOSIS — Z992 Dependence on renal dialysis: Secondary | ICD-10-CM | POA: Diagnosis not present

## 2011-08-14 DIAGNOSIS — D509 Iron deficiency anemia, unspecified: Secondary | ICD-10-CM | POA: Diagnosis not present

## 2011-08-16 DIAGNOSIS — Z992 Dependence on renal dialysis: Secondary | ICD-10-CM | POA: Diagnosis not present

## 2011-08-16 DIAGNOSIS — E119 Type 2 diabetes mellitus without complications: Secondary | ICD-10-CM | POA: Diagnosis not present

## 2011-08-16 DIAGNOSIS — N186 End stage renal disease: Secondary | ICD-10-CM | POA: Diagnosis not present

## 2011-08-16 DIAGNOSIS — N2581 Secondary hyperparathyroidism of renal origin: Secondary | ICD-10-CM | POA: Diagnosis not present

## 2011-08-16 DIAGNOSIS — D509 Iron deficiency anemia, unspecified: Secondary | ICD-10-CM | POA: Diagnosis not present

## 2011-08-18 DIAGNOSIS — Z992 Dependence on renal dialysis: Secondary | ICD-10-CM | POA: Diagnosis not present

## 2011-08-18 DIAGNOSIS — N2581 Secondary hyperparathyroidism of renal origin: Secondary | ICD-10-CM | POA: Diagnosis not present

## 2011-08-18 DIAGNOSIS — E119 Type 2 diabetes mellitus without complications: Secondary | ICD-10-CM | POA: Diagnosis not present

## 2011-08-18 DIAGNOSIS — D509 Iron deficiency anemia, unspecified: Secondary | ICD-10-CM | POA: Diagnosis not present

## 2011-08-18 DIAGNOSIS — N186 End stage renal disease: Secondary | ICD-10-CM | POA: Diagnosis not present

## 2011-08-21 DIAGNOSIS — Z992 Dependence on renal dialysis: Secondary | ICD-10-CM | POA: Diagnosis not present

## 2011-08-21 DIAGNOSIS — E119 Type 2 diabetes mellitus without complications: Secondary | ICD-10-CM | POA: Diagnosis not present

## 2011-08-21 DIAGNOSIS — D509 Iron deficiency anemia, unspecified: Secondary | ICD-10-CM | POA: Diagnosis not present

## 2011-08-21 DIAGNOSIS — N2581 Secondary hyperparathyroidism of renal origin: Secondary | ICD-10-CM | POA: Diagnosis not present

## 2011-08-21 DIAGNOSIS — N186 End stage renal disease: Secondary | ICD-10-CM | POA: Diagnosis not present

## 2011-08-23 DIAGNOSIS — N2581 Secondary hyperparathyroidism of renal origin: Secondary | ICD-10-CM | POA: Diagnosis not present

## 2011-08-23 DIAGNOSIS — N186 End stage renal disease: Secondary | ICD-10-CM | POA: Diagnosis not present

## 2011-08-23 DIAGNOSIS — Z992 Dependence on renal dialysis: Secondary | ICD-10-CM | POA: Diagnosis not present

## 2011-08-23 DIAGNOSIS — D509 Iron deficiency anemia, unspecified: Secondary | ICD-10-CM | POA: Diagnosis not present

## 2011-08-23 DIAGNOSIS — E119 Type 2 diabetes mellitus without complications: Secondary | ICD-10-CM | POA: Diagnosis not present

## 2011-08-25 DIAGNOSIS — N2581 Secondary hyperparathyroidism of renal origin: Secondary | ICD-10-CM | POA: Diagnosis not present

## 2011-08-25 DIAGNOSIS — Z992 Dependence on renal dialysis: Secondary | ICD-10-CM | POA: Diagnosis not present

## 2011-08-25 DIAGNOSIS — N186 End stage renal disease: Secondary | ICD-10-CM | POA: Diagnosis not present

## 2011-08-25 DIAGNOSIS — E119 Type 2 diabetes mellitus without complications: Secondary | ICD-10-CM | POA: Diagnosis not present

## 2011-08-25 DIAGNOSIS — D509 Iron deficiency anemia, unspecified: Secondary | ICD-10-CM | POA: Diagnosis not present

## 2011-08-28 DIAGNOSIS — Z992 Dependence on renal dialysis: Secondary | ICD-10-CM | POA: Diagnosis not present

## 2011-08-28 DIAGNOSIS — N2581 Secondary hyperparathyroidism of renal origin: Secondary | ICD-10-CM | POA: Diagnosis not present

## 2011-08-28 DIAGNOSIS — N186 End stage renal disease: Secondary | ICD-10-CM | POA: Diagnosis not present

## 2011-08-28 DIAGNOSIS — E119 Type 2 diabetes mellitus without complications: Secondary | ICD-10-CM | POA: Diagnosis not present

## 2011-08-28 DIAGNOSIS — D509 Iron deficiency anemia, unspecified: Secondary | ICD-10-CM | POA: Diagnosis not present

## 2011-08-30 DIAGNOSIS — N2581 Secondary hyperparathyroidism of renal origin: Secondary | ICD-10-CM | POA: Diagnosis not present

## 2011-08-30 DIAGNOSIS — D509 Iron deficiency anemia, unspecified: Secondary | ICD-10-CM | POA: Diagnosis not present

## 2011-08-30 DIAGNOSIS — Z992 Dependence on renal dialysis: Secondary | ICD-10-CM | POA: Diagnosis not present

## 2011-08-30 DIAGNOSIS — E119 Type 2 diabetes mellitus without complications: Secondary | ICD-10-CM | POA: Diagnosis not present

## 2011-08-30 DIAGNOSIS — N186 End stage renal disease: Secondary | ICD-10-CM | POA: Diagnosis not present

## 2011-09-01 DIAGNOSIS — Z992 Dependence on renal dialysis: Secondary | ICD-10-CM | POA: Diagnosis not present

## 2011-09-01 DIAGNOSIS — E119 Type 2 diabetes mellitus without complications: Secondary | ICD-10-CM | POA: Diagnosis not present

## 2011-09-01 DIAGNOSIS — D509 Iron deficiency anemia, unspecified: Secondary | ICD-10-CM | POA: Diagnosis not present

## 2011-09-01 DIAGNOSIS — N186 End stage renal disease: Secondary | ICD-10-CM | POA: Diagnosis not present

## 2011-09-01 DIAGNOSIS — N2581 Secondary hyperparathyroidism of renal origin: Secondary | ICD-10-CM | POA: Diagnosis not present

## 2011-09-04 DIAGNOSIS — N2581 Secondary hyperparathyroidism of renal origin: Secondary | ICD-10-CM | POA: Diagnosis not present

## 2011-09-04 DIAGNOSIS — Z992 Dependence on renal dialysis: Secondary | ICD-10-CM | POA: Diagnosis not present

## 2011-09-04 DIAGNOSIS — E119 Type 2 diabetes mellitus without complications: Secondary | ICD-10-CM | POA: Diagnosis not present

## 2011-09-04 DIAGNOSIS — N186 End stage renal disease: Secondary | ICD-10-CM | POA: Diagnosis not present

## 2011-09-04 DIAGNOSIS — D509 Iron deficiency anemia, unspecified: Secondary | ICD-10-CM | POA: Diagnosis not present

## 2011-09-06 DIAGNOSIS — E119 Type 2 diabetes mellitus without complications: Secondary | ICD-10-CM | POA: Diagnosis not present

## 2011-09-06 DIAGNOSIS — N2581 Secondary hyperparathyroidism of renal origin: Secondary | ICD-10-CM | POA: Diagnosis not present

## 2011-09-06 DIAGNOSIS — N186 End stage renal disease: Secondary | ICD-10-CM | POA: Diagnosis not present

## 2011-09-06 DIAGNOSIS — Z992 Dependence on renal dialysis: Secondary | ICD-10-CM | POA: Diagnosis not present

## 2011-09-06 DIAGNOSIS — D509 Iron deficiency anemia, unspecified: Secondary | ICD-10-CM | POA: Diagnosis not present

## 2011-09-08 DIAGNOSIS — D509 Iron deficiency anemia, unspecified: Secondary | ICD-10-CM | POA: Diagnosis not present

## 2011-09-08 DIAGNOSIS — N186 End stage renal disease: Secondary | ICD-10-CM | POA: Diagnosis not present

## 2011-09-08 DIAGNOSIS — E119 Type 2 diabetes mellitus without complications: Secondary | ICD-10-CM | POA: Diagnosis not present

## 2011-09-08 DIAGNOSIS — N2581 Secondary hyperparathyroidism of renal origin: Secondary | ICD-10-CM | POA: Diagnosis not present

## 2011-09-08 DIAGNOSIS — Z992 Dependence on renal dialysis: Secondary | ICD-10-CM | POA: Diagnosis not present

## 2011-09-09 DIAGNOSIS — N186 End stage renal disease: Secondary | ICD-10-CM | POA: Diagnosis not present

## 2011-09-11 DIAGNOSIS — E119 Type 2 diabetes mellitus without complications: Secondary | ICD-10-CM | POA: Diagnosis not present

## 2011-09-11 DIAGNOSIS — Z992 Dependence on renal dialysis: Secondary | ICD-10-CM | POA: Diagnosis not present

## 2011-09-11 DIAGNOSIS — D509 Iron deficiency anemia, unspecified: Secondary | ICD-10-CM | POA: Diagnosis not present

## 2011-09-11 DIAGNOSIS — Z23 Encounter for immunization: Secondary | ICD-10-CM | POA: Diagnosis not present

## 2011-09-11 DIAGNOSIS — N2581 Secondary hyperparathyroidism of renal origin: Secondary | ICD-10-CM | POA: Diagnosis not present

## 2011-09-11 DIAGNOSIS — N186 End stage renal disease: Secondary | ICD-10-CM | POA: Diagnosis not present

## 2011-10-09 DIAGNOSIS — N186 End stage renal disease: Secondary | ICD-10-CM | POA: Diagnosis not present

## 2011-10-10 ENCOUNTER — Ambulatory Visit (INDEPENDENT_AMBULATORY_CARE_PROVIDER_SITE_OTHER): Payer: Medicare Other | Admitting: Endocrinology

## 2011-10-10 ENCOUNTER — Encounter: Payer: Self-pay | Admitting: Endocrinology

## 2011-10-10 ENCOUNTER — Other Ambulatory Visit: Payer: Self-pay | Admitting: Endocrinology

## 2011-10-10 VITALS — BP 114/64 | HR 97 | Temp 98.2°F | Resp 16 | Wt 195.5 lb

## 2011-10-10 DIAGNOSIS — E1165 Type 2 diabetes mellitus with hyperglycemia: Secondary | ICD-10-CM | POA: Diagnosis not present

## 2011-10-10 DIAGNOSIS — E119 Type 2 diabetes mellitus without complications: Secondary | ICD-10-CM | POA: Diagnosis not present

## 2011-10-10 DIAGNOSIS — E1129 Type 2 diabetes mellitus with other diabetic kidney complication: Secondary | ICD-10-CM | POA: Diagnosis not present

## 2011-10-10 DIAGNOSIS — N058 Unspecified nephritic syndrome with other morphologic changes: Secondary | ICD-10-CM

## 2011-10-10 NOTE — Progress Notes (Signed)
Subjective:    Patient ID: [name], female    DOB: 1945/08/13, 66 y.o.   MRN: [id number]  HPI Pt returns for f/u of IDDM (dx'ed 1984, complicated by renal failure, PAD, CVA, CHF, and peripheral sensory neuropathy).  She has lost a few more lbs since last ov.  pt states she feels well in general.  no cbg record, but states cbg's are well-controlled.   Past Medical History  Diagnosis Date  . LBBB (left bundle branch block)   . Other and unspecified hyperlipidemia   . Mitral valve insufficiency and aortic valve insufficiency   . Cardiomyopathy- mixed   . Unspecified essential hypertension   . Type II or unspecified type diabetes mellitus without mention of complication, not stated as uncontrolled   . Personal history of malignant neoplasm of large intestine   . Renal failure     ESRD, Dr Eliott Nine  . CHF (congestive heart failure)   . Stroke   . Anemia   . Cancer     history of colon cancer  . Coronary artery disease     severely decreased EF.   [name] Atrial tachycardia     on amiod    Past Surgical History  Procedure Date  . Colostomy   . Revision urostomy cutaneous   . Esophagogastroduodenoscopy 03-18-04  . Electrocardiogram 04-27-06  . Placement of new left forearm arteriovenous graft 03-11-08  . Left heart catheterization and right heart catheterization 12-10    R. heart cath showed elevated left and right heart filling pressures w/ pulmonary artery pressure elevated mildly out of proportion to the wedge. The left heart cath showed diffuse distal vessel disease as well as a 75% stenosis in the mid circumflex w/ a 90% stenosis of the ostial first obtuse marginal. These lesions were in close proximity. there was a 60-70%mild RCA stenosis.   . Arteriovenous graft placement 2010  . 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 Marital Status: Single    Spouse Name: N/A    Number of Children: N/A  . Years of  Education: N/A   Occupational History  . Not on file.   Social History Main Topics  . Smoking status: Never Smoker   . Smokeless tobacco: Never Used  . Alcohol Use: No  . Drug Use: No  . Sexually Active: No   Other Topics Concern  . Not on file   Social History Narrative  . No narrative on file    Current Outpatient Prescriptions on File Prior to Visit  Medication Sig Dispense Refill  . amiodarone (PACERONE) 200 MG tablet Tuesday, Thursday, Saturday, Sunday      . amiodarone (PACERONE) 200 MG tablet TAKE ONE TABLET BY MOUTH ONE TIME DAILY  30 tablet  4  . aspirin 81 MG chewable tablet Chew 81 mg by mouth daily.        . calcium acetate (PHOSLO) 667 MG capsule Take 2,001 mg by mouth 3 (three) times daily. Take 2 capsules in the middle of each meal each day      . insulin lispro protamine-insulin lispro (HUMALOG 50/50) (50-50) 100 UNIT/ML SUSP Inject 20 Units into the skin daily with breakfast.       . isosorbide mononitrate (IMDUR) 60 MG 24 hr tablet Take 60 mg by mouth daily.      . isosorbide mononitrate (IMDUR) 60 MG 24 hr tablet TAKE ONE TABLET BY MOUTH ONE TIME DAILY  30 tablet  4  . metoprolol (LOPRESSOR) 100 MG tablet Take 50 mg by mouth 2 (two) times daily. Non dialysis days only       . Ostomy Supplies (ACTIVE LIFE CONVEX 1-PC ) POUCH MISC 1 Device by Does not apply route every 3 (three) days.  30 each  5  . Ostomy Supplies (NATURA DURAHESIVE MOLDABLE) WAFR 1 Wafer by Does not apply route 3 (three) times a week.  30 Wafer  5  . Ostomy Supplies (SUR-FIT NATURA UROSTOMY POUCH) POUCH MISC 1 Device by Does not apply route every 3 (three) days.  30 each  5  . simvastatin (ZOCOR) 10 MG tablet Take 10 mg by mouth daily.      . sodium chloride 0.9 % SOLN 100 mL with DAPTOmycin 500 MG SOLR 757 mg Inject 757 mg into the vein every Monday, Wednesday, and Friday with hemodialysis.  10000 g  0  . terbinafine (LAMISIL) 250 MG tablet Take 250 mg by mouth daily. Non-dialysis days only         Allergies  Allergen Reactions  . Ace Inhibitors   . Enalapril   . Lisinopril     REACTION: unspecified  . Omnipaque (Iohexol)     Family History  Problem Relation Age of Onset  . Cancer Sister     colon  . Hypertension Other   . Hypertension Mother   . Coronary artery disease Mother   . Hypertension Father   . Diabetes Father     BP 114/64  Pulse 97  Temp 98.2 F (36.8 C) (Oral)  Resp 16  Wt 195 lb 8 oz (88.678 kg)  SpO2 98%  Review of Systems denies hypoglycemia    Objective:   Physical Exam VITAL SIGNS:  See vs page.  GENERAL: no distress. Pulses: dorsalis pedis absent bilat.   Feet: no ulcer on the feet.  feet are of normal color and temp.  no edema.  There is a healed surgical scar (transmetatarsal amputation) of the right foot.  There is severe onychomycosis of the left toenails. Neuro: sensation is intact to touch on the feet, but decreased from normal.      Assessment & Plan:  DM, apparently well-controlled

## 2011-10-10 NOTE — Patient Instructions (Addendum)
blood tests are being requested for you today.  You will be contacted with results. Continue humalog 50/50, 20 units each morning.   Please come back for a follow-up appointment in 3 months. check your blood sugar 2 times a day.  vary the time of day when you check, between before the 3 meals, and at bedtime.  also check if you have symptoms of your blood sugar being too high or too low.  please keep a record of the readings and bring it to your next appointment here.  please call us sooner if you are having low blood sugar episodes, or if it stays over 200.

## 2011-10-11 DIAGNOSIS — E119 Type 2 diabetes mellitus without complications: Secondary | ICD-10-CM | POA: Diagnosis not present

## 2011-10-11 DIAGNOSIS — D509 Iron deficiency anemia, unspecified: Secondary | ICD-10-CM | POA: Diagnosis not present

## 2011-10-11 DIAGNOSIS — N2581 Secondary hyperparathyroidism of renal origin: Secondary | ICD-10-CM | POA: Diagnosis not present

## 2011-10-11 DIAGNOSIS — Z23 Encounter for immunization: Secondary | ICD-10-CM | POA: Diagnosis not present

## 2011-10-11 DIAGNOSIS — N186 End stage renal disease: Secondary | ICD-10-CM | POA: Diagnosis not present

## 2011-10-11 DIAGNOSIS — D631 Anemia in chronic kidney disease: Secondary | ICD-10-CM | POA: Diagnosis not present

## 2011-10-11 DIAGNOSIS — N039 Chronic nephritic syndrome with unspecified morphologic changes: Secondary | ICD-10-CM | POA: Diagnosis not present

## 2011-10-11 DIAGNOSIS — Z992 Dependence on renal dialysis: Secondary | ICD-10-CM | POA: Diagnosis not present

## 2011-10-11 LAB — HEMOGLOBIN A1C: Hgb A1c MFr Bld: 7.5 % — ABNORMAL HIGH (ref <5.7)

## 2011-10-13 DIAGNOSIS — Z23 Encounter for immunization: Secondary | ICD-10-CM | POA: Diagnosis not present

## 2011-10-13 DIAGNOSIS — Z992 Dependence on renal dialysis: Secondary | ICD-10-CM | POA: Diagnosis not present

## 2011-10-13 DIAGNOSIS — D631 Anemia in chronic kidney disease: Secondary | ICD-10-CM | POA: Diagnosis not present

## 2011-10-13 DIAGNOSIS — N2581 Secondary hyperparathyroidism of renal origin: Secondary | ICD-10-CM | POA: Diagnosis not present

## 2011-10-13 DIAGNOSIS — N186 End stage renal disease: Secondary | ICD-10-CM | POA: Diagnosis not present

## 2011-10-13 DIAGNOSIS — D509 Iron deficiency anemia, unspecified: Secondary | ICD-10-CM | POA: Diagnosis not present

## 2011-10-16 DIAGNOSIS — N039 Chronic nephritic syndrome with unspecified morphologic changes: Secondary | ICD-10-CM | POA: Diagnosis not present

## 2011-10-16 DIAGNOSIS — N186 End stage renal disease: Secondary | ICD-10-CM | POA: Diagnosis not present

## 2011-10-16 DIAGNOSIS — Z992 Dependence on renal dialysis: Secondary | ICD-10-CM | POA: Diagnosis not present

## 2011-10-16 DIAGNOSIS — N2581 Secondary hyperparathyroidism of renal origin: Secondary | ICD-10-CM | POA: Diagnosis not present

## 2011-10-16 DIAGNOSIS — D509 Iron deficiency anemia, unspecified: Secondary | ICD-10-CM | POA: Diagnosis not present

## 2011-10-16 DIAGNOSIS — Z23 Encounter for immunization: Secondary | ICD-10-CM | POA: Diagnosis not present

## 2011-10-18 DIAGNOSIS — N039 Chronic nephritic syndrome with unspecified morphologic changes: Secondary | ICD-10-CM | POA: Diagnosis not present

## 2011-10-18 DIAGNOSIS — D509 Iron deficiency anemia, unspecified: Secondary | ICD-10-CM | POA: Diagnosis not present

## 2011-10-18 DIAGNOSIS — Z992 Dependence on renal dialysis: Secondary | ICD-10-CM | POA: Diagnosis not present

## 2011-10-18 DIAGNOSIS — Z23 Encounter for immunization: Secondary | ICD-10-CM | POA: Diagnosis not present

## 2011-10-18 DIAGNOSIS — N2581 Secondary hyperparathyroidism of renal origin: Secondary | ICD-10-CM | POA: Diagnosis not present

## 2011-10-18 DIAGNOSIS — N186 End stage renal disease: Secondary | ICD-10-CM | POA: Diagnosis not present

## 2011-10-20 DIAGNOSIS — N2581 Secondary hyperparathyroidism of renal origin: Secondary | ICD-10-CM | POA: Diagnosis not present

## 2011-10-20 DIAGNOSIS — N039 Chronic nephritic syndrome with unspecified morphologic changes: Secondary | ICD-10-CM | POA: Diagnosis not present

## 2011-10-20 DIAGNOSIS — Z23 Encounter for immunization: Secondary | ICD-10-CM | POA: Diagnosis not present

## 2011-10-20 DIAGNOSIS — D509 Iron deficiency anemia, unspecified: Secondary | ICD-10-CM | POA: Diagnosis not present

## 2011-10-20 DIAGNOSIS — N186 End stage renal disease: Secondary | ICD-10-CM | POA: Diagnosis not present

## 2011-10-20 DIAGNOSIS — Z992 Dependence on renal dialysis: Secondary | ICD-10-CM | POA: Diagnosis not present

## 2011-10-23 DIAGNOSIS — N2581 Secondary hyperparathyroidism of renal origin: Secondary | ICD-10-CM | POA: Diagnosis not present

## 2011-10-23 DIAGNOSIS — N186 End stage renal disease: Secondary | ICD-10-CM | POA: Diagnosis not present

## 2011-10-23 DIAGNOSIS — Z23 Encounter for immunization: Secondary | ICD-10-CM | POA: Diagnosis not present

## 2011-10-23 DIAGNOSIS — D509 Iron deficiency anemia, unspecified: Secondary | ICD-10-CM | POA: Diagnosis not present

## 2011-10-23 DIAGNOSIS — N039 Chronic nephritic syndrome with unspecified morphologic changes: Secondary | ICD-10-CM | POA: Diagnosis not present

## 2011-10-23 DIAGNOSIS — Z992 Dependence on renal dialysis: Secondary | ICD-10-CM | POA: Diagnosis not present

## 2011-10-25 DIAGNOSIS — N2581 Secondary hyperparathyroidism of renal origin: Secondary | ICD-10-CM | POA: Diagnosis not present

## 2011-10-25 DIAGNOSIS — D509 Iron deficiency anemia, unspecified: Secondary | ICD-10-CM | POA: Diagnosis not present

## 2011-10-25 DIAGNOSIS — N186 End stage renal disease: Secondary | ICD-10-CM | POA: Diagnosis not present

## 2011-10-25 DIAGNOSIS — Z23 Encounter for immunization: Secondary | ICD-10-CM | POA: Diagnosis not present

## 2011-10-25 DIAGNOSIS — D631 Anemia in chronic kidney disease: Secondary | ICD-10-CM | POA: Diagnosis not present

## 2011-10-25 DIAGNOSIS — N039 Chronic nephritic syndrome with unspecified morphologic changes: Secondary | ICD-10-CM | POA: Diagnosis not present

## 2011-10-25 DIAGNOSIS — Z992 Dependence on renal dialysis: Secondary | ICD-10-CM | POA: Diagnosis not present

## 2011-10-27 DIAGNOSIS — N2581 Secondary hyperparathyroidism of renal origin: Secondary | ICD-10-CM | POA: Diagnosis not present

## 2011-10-27 DIAGNOSIS — D631 Anemia in chronic kidney disease: Secondary | ICD-10-CM | POA: Diagnosis not present

## 2011-10-27 DIAGNOSIS — N186 End stage renal disease: Secondary | ICD-10-CM | POA: Diagnosis not present

## 2011-10-27 DIAGNOSIS — Z23 Encounter for immunization: Secondary | ICD-10-CM | POA: Diagnosis not present

## 2011-10-27 DIAGNOSIS — D509 Iron deficiency anemia, unspecified: Secondary | ICD-10-CM | POA: Diagnosis not present

## 2011-10-27 DIAGNOSIS — Z992 Dependence on renal dialysis: Secondary | ICD-10-CM | POA: Diagnosis not present

## 2011-10-30 ENCOUNTER — Other Ambulatory Visit: Payer: Self-pay | Admitting: Ophthalmology

## 2011-10-30 DIAGNOSIS — E11359 Type 2 diabetes mellitus with proliferative diabetic retinopathy without macular edema: Secondary | ICD-10-CM | POA: Diagnosis not present

## 2011-10-30 DIAGNOSIS — N390 Urinary tract infection, site not specified: Secondary | ICD-10-CM | POA: Diagnosis not present

## 2011-10-30 DIAGNOSIS — Z23 Encounter for immunization: Secondary | ICD-10-CM | POA: Diagnosis not present

## 2011-10-30 DIAGNOSIS — N2581 Secondary hyperparathyroidism of renal origin: Secondary | ICD-10-CM | POA: Diagnosis not present

## 2011-10-30 DIAGNOSIS — D509 Iron deficiency anemia, unspecified: Secondary | ICD-10-CM | POA: Diagnosis not present

## 2011-10-30 DIAGNOSIS — N039 Chronic nephritic syndrome with unspecified morphologic changes: Secondary | ICD-10-CM | POA: Diagnosis not present

## 2011-10-30 DIAGNOSIS — Z992 Dependence on renal dialysis: Secondary | ICD-10-CM | POA: Diagnosis not present

## 2011-10-30 DIAGNOSIS — H4310 Vitreous hemorrhage, unspecified eye: Secondary | ICD-10-CM | POA: Diagnosis not present

## 2011-10-30 DIAGNOSIS — N186 End stage renal disease: Secondary | ICD-10-CM | POA: Diagnosis not present

## 2011-10-30 NOTE — H&P (Signed)
Pre-operative History and Physical for Ophthalmic Surgery  [name] 10/30/2011                  Chief Complaint: Vitreous floaters, decreased visio9n  Diagnosis: Vitreous Hemorrhage, Proliferative Diabetic Retinopathy Right Eye  Allergies  Allergen Reactions  . Ace Inhibitors   . Enalapril   . Lisinopril     REACTION: unspecified  . Omnipaque (Iohexol)      Prior to Admission medications   Medication Sig Start Date End Date Taking? Authorizing Provider  amiodarone (PACERONE) 200 MG tablet Tuesday, Thursday, Saturday, Sunday 05/02/11   Duke Salvia, MD  amiodarone (PACERONE) 200 MG tablet TAKE ONE TABLET BY MOUTH ONE TIME DAILY 07/25/11   Duke Salvia, MD  aspirin 81 MG chewable tablet Chew 81 mg by mouth daily.      Historical Provider, MD  calcium acetate (PHOSLO) 667 MG capsule Take 2,001 mg by mouth 3 (three) times daily. Take 2 capsules in the middle of each meal each day    Historical Provider, MD  insulin lispro protamine-insulin lispro (HUMALOG 50/50) (50-50) 100 UNIT/ML SUSP Inject 20 Units into the skin daily with breakfast.     Historical Provider, MD  isosorbide mononitrate (IMDUR) 60 MG 24 hr tablet Take 60 mg by mouth daily.    Historical Provider, MD  isosorbide mononitrate (IMDUR) 60 MG 24 hr tablet TAKE ONE TABLET BY MOUTH ONE TIME DAILY 07/25/11   Duke Salvia, MD  metoprolol (LOPRESSOR) 100 MG tablet Take 50 mg by mouth 2 (two) times daily. Non dialysis days only  06/01/10   Tresa Garter, MD  Ostomy Supplies (ACTIVE LIFE CONVEX 1-PC ) [facility] MISC 1 Device by Does not apply route every 3 (three) days. 04/11/11   Tresa Garter, MD  Ostomy Supplies (NATURA DURAHESIVE MOLDABLE) WAFR 1 Wafer by Does not apply route 3 (three) times a week. 04/11/11   Tresa Garter, MD  Ostomy Supplies (SUR-FIT NATURA UROSTOMY [place]) [facility] MISC 1 Device by Does not apply route every 3 (three) days. 04/11/11   Georgina Quint Plotnikov, MD  simvastatin (ZOCOR) 10 MG  tablet Take 10 mg by mouth daily.    Historical Provider, MD  sodium chloride 0.9 % SOLN 100 mL with DAPTOmycin 500 MG SOLR 757 mg Inject 757 mg into the vein every Monday, Wednesday, and Friday with hemodialysis. 05/27/11   Belkys A Regalado, MD  terbinafine (LAMISIL) 250 MG tablet Take 250 mg by mouth daily. Non-dialysis days only    Historical Provider, MD    Planned Procedure: Pars Plana Vitrectomy, Membrane Peeling and Endolaser Right Eye   There were no vitals filed for this visit.  Pulse: 76         Temp: NE        Resp:  16      ROS: negative  Past Medical History  Diagnosis Date  . LBBB (left bundle branch block)   . Other and unspecified hyperlipidemia   . Mitral valve insufficiency and aortic valve insufficiency   . Cardiomyopathy- mixed   . Unspecified essential hypertension   . Type II or unspecified type diabetes mellitus without mention of complication, not stated as uncontrolled   . Personal history of malignant neoplasm of large intestine   . Renal failure     ESRD, Dr Eliott Nine  . CHF (congestive heart failure)   . Stroke   . Anemia   . Cancer     history of colon cancer  .  Coronary artery disease     severely decreased EF.   [name] Atrial tachycardia     on amiod    Past Surgical History  Procedure Date  . Colostomy   . Revision urostomy cutaneous   . Esophagogastroduodenoscopy 03-18-04  . Electrocardiogram 04-27-06  . Placement of new left forearm arteriovenous graft 03-11-08  . Left heart catheterization and right heart catheterization 12-10    R. heart cath showed elevated left and right heart filling pressures w/ pulmonary artery pressure elevated mildly out of proportion to the wedge. The left heart cath showed diffuse distal vessel disease as well as a 75% stenosis in the mid circumflex w/ a 90% stenosis of the ostial first obtuse marginal. These lesions were in close proximity. there was a 60-70%mild RCA stenosis.   . Arteriovenous graft placement 2010  .  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 Marital Status: Single    Spouse Name: N/A    Number of Children: N/A  . Years of Education: N/A   Occupational History  . Not on file.   Social History Main Topics  . Smoking status: Never Smoker   . Smokeless tobacco: Never Used  . Alcohol Use: No  . Drug Use: No  . Sexually Active: No   Other Topics Concern  . Not on file   Social History Narrative  . No narrative on file     The following examination is for anesthesia clearance for minimally invasive Ophthalmic surgery. It is primarily to document heart and lung findings and is not intended to elucidate unknown general medical conditions inclusive of abdominal masses, lung lesions, etc.   General Constitution:  within normal limits   Alertness/Orientation:  Person, time place     yes   HEENT:  Eye Findings: decreased vision and floaters                   right eye  Neck: supple without masses  Chest/Lungs: clear to auscultation  Cardiac: Normal S1 and S2 without Murmur, S3 or S4  Neuro: non-focal   Impression: Proliferative Diabetic Retinopathy, Vitreous Hemorrhage Right Eye  Planned Procedure:  Pars Plana Vitrectomy, Membrane Peeling, Fluid Gas Exchange Right Clement Husbands, MD

## 2011-11-01 DIAGNOSIS — E1129 Type 2 diabetes mellitus with other diabetic kidney complication: Secondary | ICD-10-CM | POA: Diagnosis not present

## 2011-11-01 DIAGNOSIS — Z992 Dependence on renal dialysis: Secondary | ICD-10-CM | POA: Diagnosis not present

## 2011-11-01 DIAGNOSIS — N2581 Secondary hyperparathyroidism of renal origin: Secondary | ICD-10-CM | POA: Diagnosis not present

## 2011-11-01 DIAGNOSIS — N186 End stage renal disease: Secondary | ICD-10-CM | POA: Diagnosis not present

## 2011-11-01 DIAGNOSIS — D509 Iron deficiency anemia, unspecified: Secondary | ICD-10-CM | POA: Diagnosis not present

## 2011-11-01 DIAGNOSIS — Z23 Encounter for immunization: Secondary | ICD-10-CM | POA: Diagnosis not present

## 2011-11-01 DIAGNOSIS — D631 Anemia in chronic kidney disease: Secondary | ICD-10-CM | POA: Diagnosis not present

## 2011-11-03 DIAGNOSIS — N2581 Secondary hyperparathyroidism of renal origin: Secondary | ICD-10-CM | POA: Diagnosis not present

## 2011-11-03 DIAGNOSIS — D509 Iron deficiency anemia, unspecified: Secondary | ICD-10-CM | POA: Diagnosis not present

## 2011-11-03 DIAGNOSIS — N186 End stage renal disease: Secondary | ICD-10-CM | POA: Diagnosis not present

## 2011-11-03 DIAGNOSIS — D631 Anemia in chronic kidney disease: Secondary | ICD-10-CM | POA: Diagnosis not present

## 2011-11-03 DIAGNOSIS — Z23 Encounter for immunization: Secondary | ICD-10-CM | POA: Diagnosis not present

## 2011-11-03 DIAGNOSIS — Z992 Dependence on renal dialysis: Secondary | ICD-10-CM | POA: Diagnosis not present

## 2011-11-06 DIAGNOSIS — N186 End stage renal disease: Secondary | ICD-10-CM | POA: Diagnosis not present

## 2011-11-06 DIAGNOSIS — Z23 Encounter for immunization: Secondary | ICD-10-CM | POA: Diagnosis not present

## 2011-11-06 DIAGNOSIS — D509 Iron deficiency anemia, unspecified: Secondary | ICD-10-CM | POA: Diagnosis not present

## 2011-11-06 DIAGNOSIS — N2581 Secondary hyperparathyroidism of renal origin: Secondary | ICD-10-CM | POA: Diagnosis not present

## 2011-11-06 DIAGNOSIS — D631 Anemia in chronic kidney disease: Secondary | ICD-10-CM | POA: Diagnosis not present

## 2011-11-06 DIAGNOSIS — Z992 Dependence on renal dialysis: Secondary | ICD-10-CM | POA: Diagnosis not present

## 2011-11-08 DIAGNOSIS — D509 Iron deficiency anemia, unspecified: Secondary | ICD-10-CM | POA: Diagnosis not present

## 2011-11-08 DIAGNOSIS — Z992 Dependence on renal dialysis: Secondary | ICD-10-CM | POA: Diagnosis not present

## 2011-11-08 DIAGNOSIS — Z23 Encounter for immunization: Secondary | ICD-10-CM | POA: Diagnosis not present

## 2011-11-08 DIAGNOSIS — D631 Anemia in chronic kidney disease: Secondary | ICD-10-CM | POA: Diagnosis not present

## 2011-11-08 DIAGNOSIS — N186 End stage renal disease: Secondary | ICD-10-CM | POA: Diagnosis not present

## 2011-11-08 DIAGNOSIS — N2581 Secondary hyperparathyroidism of renal origin: Secondary | ICD-10-CM | POA: Diagnosis not present

## 2011-11-09 DIAGNOSIS — N186 End stage renal disease: Secondary | ICD-10-CM | POA: Diagnosis not present

## 2011-11-10 DIAGNOSIS — Z992 Dependence on renal dialysis: Secondary | ICD-10-CM | POA: Diagnosis not present

## 2011-11-10 DIAGNOSIS — D631 Anemia in chronic kidney disease: Secondary | ICD-10-CM | POA: Diagnosis not present

## 2011-11-10 DIAGNOSIS — E119 Type 2 diabetes mellitus without complications: Secondary | ICD-10-CM | POA: Diagnosis not present

## 2011-11-10 DIAGNOSIS — N186 End stage renal disease: Secondary | ICD-10-CM | POA: Diagnosis not present

## 2011-11-10 DIAGNOSIS — N2581 Secondary hyperparathyroidism of renal origin: Secondary | ICD-10-CM | POA: Diagnosis not present

## 2011-11-10 DIAGNOSIS — D509 Iron deficiency anemia, unspecified: Secondary | ICD-10-CM | POA: Diagnosis not present

## 2011-11-21 ENCOUNTER — Encounter (HOSPITAL_COMMUNITY): Payer: Self-pay

## 2011-11-21 ENCOUNTER — Encounter (HOSPITAL_COMMUNITY): Payer: Self-pay | Admitting: Pharmacy Technician

## 2011-11-21 ENCOUNTER — Encounter (HOSPITAL_COMMUNITY)
Admission: RE | Admit: 2011-11-21 | Discharge: 2011-11-21 | Disposition: A | Discharge status: Home / Self Care | Payer: Medicare Other | Source: Ambulatory Visit | Attending: Ophthalmology | Admitting: Ophthalmology

## 2011-11-21 DIAGNOSIS — Z794 Long term (current) use of insulin: Secondary | ICD-10-CM | POA: Diagnosis not present

## 2011-11-21 DIAGNOSIS — E11359 Type 2 diabetes mellitus with proliferative diabetic retinopathy without macular edema: Secondary | ICD-10-CM | POA: Diagnosis not present

## 2011-11-21 DIAGNOSIS — H4310 Vitreous hemorrhage, unspecified eye: Secondary | ICD-10-CM | POA: Diagnosis not present

## 2011-11-21 DIAGNOSIS — I10 Essential (primary) hypertension: Secondary | ICD-10-CM | POA: Diagnosis not present

## 2011-11-21 DIAGNOSIS — E1139 Type 2 diabetes mellitus with other diabetic ophthalmic complication: Secondary | ICD-10-CM | POA: Diagnosis not present

## 2011-11-21 DIAGNOSIS — Z79899 Other long term (current) drug therapy: Secondary | ICD-10-CM | POA: Diagnosis not present

## 2011-11-21 HISTORY — DX: Cardiac murmur, unspecified: R01.1

## 2011-11-21 HISTORY — DX: Unspecified osteoarthritis, unspecified site: M19.90

## 2011-11-21 HISTORY — DX: Incisional hernia without obstruction or gangrene: K43.2

## 2011-11-21 LAB — SURGICAL PCR SCREEN: Staphylococcus aureus: POSITIVE — AB

## 2011-11-21 NOTE — Progress Notes (Signed)
11/21/11 0957  OBSTRUCTIVE SLEEP APNEA  Score 4 or greater  Results sent to PCP

## 2011-11-21 NOTE — Pre-Procedure Instructions (Signed)
20 NYLAYA CORPREW  11/21/2011   Your procedure is scheduled on:  Wednesday, November 20th.  Call  [facility] at 8:00 AM.  Call this number  the morning of surgery: [phone]   Remember:   Do not eat food or drink any liquid:After Midnight.     Take these medicines the morning of surgery with A SIP OF WATER: Amiodarone (Pacerone), Isosorbide Mononitrate (Imdur), Metoprolol (Lopressor), Terbinafine (Lamisil).   Do not wear jewelry, make-up or nail polish.  Do not wear lotions, powders, or perfumes. You may wear deodorant.  Do not shave 48 hours prior to surgery. Men may shave face and neck.  Do not bring valuables to the hospital.  Contacts, dentures or bridgework may not be worn into surgery.  Leave suitcase in the car. After surgery it may be brought to your room.  For patients admitted to the hospital, checkout time is 11:00 AM the day of discharge.   Patients discharged the day of surgery will not be allowed to drive home.  Name and phone number of your driver: ____________________________  Special Instructions: Shower using CHG 2 nights before surgery and the night before surgery.  If you shower the day of surgery use CHG.  Use special wash - you have one bottle of CHG for all showers.  You should use approximately 1/3 of the bottle for each shower.   Please read over the following fact sheets that you were given: Pain Booklet, Coughing and Deep Breathing and Surgical Site Infection Prevention

## 2011-11-23 DIAGNOSIS — E1059 Type 1 diabetes mellitus with other circulatory complications: Secondary | ICD-10-CM | POA: Diagnosis not present

## 2011-11-23 DIAGNOSIS — M79609 Pain in unspecified limb: Secondary | ICD-10-CM | POA: Diagnosis not present

## 2011-11-23 DIAGNOSIS — I739 Peripheral vascular disease, unspecified: Secondary | ICD-10-CM | POA: Diagnosis not present

## 2011-11-23 DIAGNOSIS — L84 Corns and callosities: Secondary | ICD-10-CM | POA: Diagnosis not present

## 2011-11-23 DIAGNOSIS — B351 Tinea unguium: Secondary | ICD-10-CM | POA: Diagnosis not present

## 2011-11-23 DIAGNOSIS — L608 Other nail disorders: Secondary | ICD-10-CM | POA: Diagnosis not present

## 2011-11-28 MED ORDER — GATIFLOXACIN 0.5 % OP SOLN
1.0000 [drp] | OPHTHALMIC | Status: AC | PRN
  Administered 2011-11-29 (×3): 1 [drp] via OPHTHALMIC
  Filled 2011-11-28: qty 2.5

## 2011-11-28 MED ORDER — PHENYLEPHRINE HCL 2.5 % OP SOLN
1.0000 [drp] | OPHTHALMIC | Status: AC | PRN
  Administered 2011-11-29 (×3): 1 [drp] via OPHTHALMIC
  Filled 2011-11-28: qty 3

## 2011-11-28 MED ORDER — PREDNISOLONE ACETATE 1 % OP SUSP
1.0000 [drp] | OPHTHALMIC | Status: AC
  Administered 2011-11-29: 1 [drp] via OPHTHALMIC
  Filled 2011-11-28: qty 5

## 2011-11-28 MED ORDER — TETRACAINE HCL 0.5 % OP SOLN
1.0000 [drp] | OPHTHALMIC | Status: AC
  Administered 2011-11-29: 2 [drp] via OPHTHALMIC
  Filled 2011-11-28: qty 2

## 2011-11-29 ENCOUNTER — Encounter (HOSPITAL_COMMUNITY): Payer: Self-pay | Admitting: Anesthesiology

## 2011-11-29 ENCOUNTER — Ambulatory Visit (HOSPITAL_COMMUNITY): Payer: Medicare Other | Admitting: Anesthesiology

## 2011-11-29 ENCOUNTER — Ambulatory Visit (HOSPITAL_COMMUNITY)
Admission: RE | Admit: 2011-11-29 | Discharge: 2011-11-29 | Disposition: A | Discharge status: Home / Self Care | Payer: Medicare Other | Source: Ambulatory Visit | Attending: Ophthalmology | Admitting: Ophthalmology

## 2011-11-29 ENCOUNTER — Encounter (HOSPITAL_COMMUNITY)
Admission: RE | Disposition: A | Discharge status: Home / Self Care | Payer: Self-pay | Source: Ambulatory Visit | Attending: Ophthalmology

## 2011-11-29 ENCOUNTER — Encounter (HOSPITAL_COMMUNITY): Payer: Self-pay | Admitting: *Deleted

## 2011-11-29 DIAGNOSIS — E1139 Type 2 diabetes mellitus with other diabetic ophthalmic complication: Secondary | ICD-10-CM | POA: Insufficient documentation

## 2011-11-29 DIAGNOSIS — H4310 Vitreous hemorrhage, unspecified eye: Secondary | ICD-10-CM | POA: Insufficient documentation

## 2011-11-29 DIAGNOSIS — Z794 Long term (current) use of insulin: Secondary | ICD-10-CM | POA: Diagnosis not present

## 2011-11-29 DIAGNOSIS — E11359 Type 2 diabetes mellitus with proliferative diabetic retinopathy without macular edema: Secondary | ICD-10-CM | POA: Insufficient documentation

## 2011-11-29 DIAGNOSIS — I10 Essential (primary) hypertension: Secondary | ICD-10-CM | POA: Insufficient documentation

## 2011-11-29 DIAGNOSIS — Z79899 Other long term (current) drug therapy: Secondary | ICD-10-CM | POA: Diagnosis not present

## 2011-11-29 HISTORY — PX: PARS PLANA VITRECTOMY: SHX2166

## 2011-11-29 LAB — GLUCOSE, CAPILLARY
Glucose-Capillary: 85 mg/dL (ref 70–99)
Glucose-Capillary: 116 mg/dL — ABNORMAL HIGH (ref 70–99)
Glucose-Capillary: 101 mg/dL — ABNORMAL HIGH (ref 70–99)

## 2011-11-29 LAB — POCT I-STAT 4, (NA,K, GLUC, HGB,HCT)
Hemoglobin: 14.3 g/dL (ref 12.0–15.0)
Potassium: 3.5 mEq/L (ref 3.5–5.1)
Sodium: 141 mEq/L (ref 135–145)

## 2011-11-29 SURGERY — PARS PLANA VITRECTOMY WITH 23 GAUGE
Anesthesia: General | Site: Eye | Laterality: Right | Wound class: Clean

## 2011-11-29 MED ORDER — SODIUM CHLORIDE 0.9 % IV SOLN
INTRAVENOUS | Status: DC | PRN
  Administered 2011-11-29 (×2): via INTRAVENOUS

## 2011-11-29 MED ORDER — ROCURONIUM BROMIDE 100 MG/10ML IV SOLN
INTRAVENOUS | Status: DC | PRN
  Administered 2011-11-29: 10 mg via INTRAVENOUS

## 2011-11-29 MED ORDER — BUPIVACAINE HCL (PF) 0.75 % IJ SOLN
INTRAMUSCULAR | Status: AC
  Filled 2011-11-29: qty 10

## 2011-11-29 MED ORDER — ONDANSETRON HCL 4 MG/2ML IJ SOLN
4.0000 mg | Freq: Once | INTRAMUSCULAR | Status: AC | PRN
  Administered 2011-11-29: 4 mg via INTRAVENOUS

## 2011-11-29 MED ORDER — ONDANSETRON HCL 4 MG/2ML IJ SOLN
INTRAMUSCULAR | Status: AC
  Administered 2011-11-29: 4 mg via INTRAVENOUS
  Filled 2011-11-29: qty 2

## 2011-11-29 MED ORDER — LIDOCAINE HCL (CARDIAC) 20 MG/ML IV SOLN
INTRAVENOUS | Status: DC | PRN
  Administered 2011-11-29: 60 mg via INTRAVENOUS

## 2011-11-29 MED ORDER — BACITRACIN-POLYMYXIN B 500-10000 UNIT/GM OP OINT
TOPICAL_OINTMENT | OPHTHALMIC | Status: AC
  Filled 2011-11-29: qty 3.5

## 2011-11-29 MED ORDER — DEXTROSE 50 % IV SOLN
INTRAVENOUS | Status: AC
  Administered 2011-11-29: 25 mL via INTRAVENOUS
  Filled 2011-11-29: qty 50

## 2011-11-29 MED ORDER — MIDAZOLAM HCL 5 MG/5ML IJ SOLN
INTRAMUSCULAR | Status: DC | PRN
  Administered 2011-11-29: 1 mg via INTRAVENOUS

## 2011-11-29 MED ORDER — SODIUM HYALURONATE 10 MG/ML IO SOLN
INTRAOCULAR | Status: AC
  Filled 2011-11-29: qty 0.85

## 2011-11-29 MED ORDER — BSS IO SOLN
INTRAOCULAR | Status: DC | PRN
  Administered 2011-11-29: 500 mL via INTRAOCULAR

## 2011-11-29 MED ORDER — TRAMADOL HCL 50 MG PO TABS: 50.0000 mg | ORAL_TABLET | ORAL | Status: DC | PRN

## 2011-11-29 MED ORDER — EPINEPHRINE HCL 1 MG/ML IJ SOLN
INTRAMUSCULAR | Status: AC
  Filled 2011-11-29: qty 1

## 2011-11-29 MED ORDER — SODIUM CHLORIDE 0.9 % IV SOLN: INTRAVENOUS | Status: DC

## 2011-11-29 MED ORDER — PROPOFOL 10 MG/ML IV BOLUS
INTRAVENOUS | Status: DC | PRN
  Administered 2011-11-29: 130 mg via INTRAVENOUS

## 2011-11-29 MED ORDER — HYDROMORPHONE HCL PF 1 MG/ML IJ SOLN: 0.2500 mg | INTRAMUSCULAR | Status: DC | PRN

## 2011-11-29 MED ORDER — LIDOCAINE HCL 2 % IJ SOLN
INTRAMUSCULAR | Status: DC | PRN
  Administered 2011-11-29: 2.5 mL

## 2011-11-29 MED ORDER — SUCCINYLCHOLINE CHLORIDE 20 MG/ML IJ SOLN
INTRAMUSCULAR | Status: DC | PRN
  Administered 2011-11-29: 100 mg via INTRAVENOUS

## 2011-11-29 MED ORDER — BSS IO SOLN
INTRAOCULAR | Status: AC
  Filled 2011-11-29: qty 500

## 2011-11-29 MED ORDER — HYPROMELLOSE (GONIOSCOPIC) 2.5 % OP SOLN
OPHTHALMIC | Status: AC
  Filled 2011-11-29: qty 15

## 2011-11-29 MED ORDER — NA CHONDROIT SULF-NA HYALURON 40-30 MG/ML IO SOLN
INTRAOCULAR | Status: AC
  Filled 2011-11-29: qty 0.5

## 2011-11-29 MED ORDER — MEPERIDINE HCL 25 MG/ML IJ SOLN: 6.2500 mg | INTRAMUSCULAR | Status: DC | PRN

## 2011-11-29 MED ORDER — RANIBIZUMAB 0.5 MG/0.05ML IO SOLN
0.5000 mg | INTRAOCULAR | Status: AC
  Administered 2011-11-29: 0.5 mg via INTRAOCULAR
  Filled 2011-11-29: qty 0.05

## 2011-11-29 MED ORDER — FENTANYL CITRATE 0.05 MG/ML IJ SOLN
INTRAMUSCULAR | Status: DC | PRN
  Administered 2011-11-29 (×2): 25 ug via INTRAVENOUS

## 2011-11-29 MED ORDER — TETRACAINE HCL 0.5 % OP SOLN
OPHTHALMIC | Status: AC
  Filled 2011-11-29: qty 2

## 2011-11-29 MED ORDER — LIDOCAINE HCL 2 % IJ SOLN
INTRAMUSCULAR | Status: AC
  Filled 2011-11-29: qty 20

## 2011-11-29 MED ORDER — BACITRACIN-POLYMYXIN B 500-10000 UNIT/GM OP OINT
TOPICAL_OINTMENT | OPHTHALMIC | Status: DC | PRN
  Administered 2011-11-29: 1

## 2011-11-29 MED ORDER — DEXTROSE 50 % IV SOLN
25.0000 mL | Freq: Once | INTRAVENOUS | Status: AC
  Administered 2011-11-29: 25 mL via INTRAVENOUS
  Filled 2011-11-29: qty 50

## 2011-11-29 MED ORDER — BSS PLUS IO SOLN
INTRAOCULAR | Status: DC | PRN
  Administered 2011-11-29: 1 via INTRAOCULAR

## 2011-11-29 MED ORDER — ACETAZOLAMIDE SODIUM 500 MG IJ SOLR
INTRAMUSCULAR | Status: AC
  Filled 2011-11-29: qty 500

## 2011-11-29 MED ORDER — BUPIVACAINE HCL (PF) 0.75 % IJ SOLN
INTRAMUSCULAR | Status: DC | PRN
  Administered 2011-11-29: 2.5 mL

## 2011-11-29 MED ORDER — OXYCODONE HCL 5 MG/5ML PO SOLN
5.0000 mg | Freq: Once | ORAL | Status: DC | PRN
Start: 2011-11-29 — End: 2011-11-30

## 2011-11-29 MED ORDER — HYPROMELLOSE (GONIOSCOPIC) 2.5 % OP SOLN
OPHTHALMIC | Status: DC | PRN
  Administered 2011-11-29: 2 [drp] via OPHTHALMIC

## 2011-11-29 MED ORDER — EPINEPHRINE HCL 1 MG/ML IJ SOLN
INTRAMUSCULAR | Status: DC | PRN
  Administered 2011-11-29: 1 mg

## 2011-11-29 MED ORDER — CEFAZOLIN SUBCONJUNCTIVAL INJECTION 100 MG/0.5 ML
200.0000 mg | INJECTION | SUBCONJUNCTIVAL | Status: AC
  Administered 2011-11-29: 200 mg via SUBCONJUNCTIVAL
  Filled 2011-11-29: qty 1

## 2011-11-29 MED ORDER — TRIAMCINOLONE ACETONIDE 40 MG/ML IJ SUSP
INTRAMUSCULAR | Status: AC
  Filled 2011-11-29: qty 1

## 2011-11-29 MED ORDER — DEXAMETHASONE SODIUM PHOSPHATE 10 MG/ML IJ SOLN
INTRAMUSCULAR | Status: DC | PRN
  Administered 2011-11-29: 10 mg via INTRA_ARTICULAR

## 2011-11-29 MED ORDER — ACETAZOLAMIDE SODIUM 500 MG IJ SOLR
INTRAMUSCULAR | Status: DC | PRN
  Administered 2011-11-29: 250 mg via INTRAVENOUS

## 2011-11-29 MED ORDER — DEXAMETHASONE SODIUM PHOSPHATE 10 MG/ML IJ SOLN
INTRAMUSCULAR | Status: AC
  Filled 2011-11-29: qty 1

## 2011-11-29 MED ORDER — SODIUM CHLORIDE 0.9 % IV SOLN
INTRAVENOUS | Status: AC
  Administered 2011-11-29: 10 mL/h via INTRAVENOUS

## 2011-11-29 MED ORDER — OXYCODONE HCL 5 MG PO TABS: 5.0000 mg | ORAL_TABLET | Freq: Once | ORAL | Status: DC | PRN

## 2011-11-29 SURGICAL SUPPLY — 74 items
ACCESSORY FRAGMATOME (MISCELLANEOUS) IMPLANT
APL SRG 3 HI ABS STRL LF PLS (MISCELLANEOUS)
APPLICATOR COTTON TIP 6IN STRL (MISCELLANEOUS) ×2 IMPLANT
APPLICATOR DR MATTHEWS STRL (MISCELLANEOUS) IMPLANT
BAG FLD CLT MN 6.25X3.5 (WOUND CARE) ×1
BAG MINI COLL DRAIN (WOUND CARE) ×2 IMPLANT
BLADE EYE MINI 60D BEAVER (BLADE) ×2 IMPLANT
BLADE KERATOME 2.75 (BLADE) IMPLANT
BLADE MVR KNIFE 19G (BLADE) ×2 IMPLANT
BLADE STAB KNIFE 15DEG (BLADE) ×2 IMPLANT
CANNULA DUAL BORE 23G (CANNULA) IMPLANT
CANNULA FLEX TIP 23G (CANNULA) IMPLANT
CLOTH BEACON ORANGE TIMEOUT ST (SAFETY) ×2 IMPLANT
CORDS BIPOLAR (ELECTRODE) IMPLANT
DRAPE OPHTHALMIC 77X100 STRL (CUSTOM PROCEDURE TRAY) ×2 IMPLANT
DRAPE POUCH INSTRU U-SHP 10X18 (DRAPES) ×2 IMPLANT
DRSG TEGADERM 4X4.75 (GAUZE/BANDAGES/DRESSINGS) ×2 IMPLANT
ERASER HMR WETFIELD 23G BP (MISCELLANEOUS) IMPLANT
FILTER BLUE MILLIPORE (MISCELLANEOUS) IMPLANT
GAS OPHTHALMIC (MISCELLANEOUS) IMPLANT
GLOVE SS BIOGEL STRL SZ 6.5 (GLOVE) ×2 IMPLANT
GLOVE SUPERSENSE BIOGEL SZ 6.5 (GLOVE) ×2
GOWN SRG XL XLNG 56XLVL 4 (GOWN DISPOSABLE) ×1 IMPLANT
GOWN STRL NON-REIN LRG LVL3 (GOWN DISPOSABLE) ×2 IMPLANT
GOWN STRL NON-REIN XL XLG LVL4 (GOWN DISPOSABLE) ×2
ILLUMINATOR WIDEFIELD DIFF (MISCELLANEOUS) ×2 IMPLANT
KIT BASIN OR (CUSTOM PROCEDURE TRAY) ×2 IMPLANT
KIT ROOM TURNOVER OR (KITS) ×2 IMPLANT
KNIFE CRESCENT 1.75 EDGEAHEAD (BLADE) ×2 IMPLANT
KNIFE GRIESHABER SHARP 2.5MM (MISCELLANEOUS) ×2 IMPLANT
MARKER SKIN DUAL TIP RULER LAB (MISCELLANEOUS) IMPLANT
MASK EYE SHIELD (GAUZE/BANDAGES/DRESSINGS) ×2 IMPLANT
NEEDLE 18GX1X1/2 (RX/OR ONLY) (NEEDLE) ×2 IMPLANT
NEEDLE 22X1 1/2 (OR ONLY) (NEEDLE) IMPLANT
NEEDLE 25GX 5/8IN NON SAFETY (NEEDLE) ×2 IMPLANT
NEEDLE FILTER BLUNT 18X 1/2SAF (NEEDLE) ×1
NEEDLE FILTER BLUNT 18X1 1/2 (NEEDLE) ×1 IMPLANT
NEEDLE HYPO 30X.5 LL (NEEDLE) ×4 IMPLANT
NS IRRIG 1000ML POUR BTL (IV SOLUTION) ×2 IMPLANT
PACK COMBINED CATERACT/VIT 23G (OPHTHALMIC RELATED) IMPLANT
PACK VITRECTOMY CUSTOM (CUSTOM PROCEDURE TRAY) ×2 IMPLANT
PACK VITRECTOMY PIC MCHSVP (PACKS) IMPLANT
PAD ARMBOARD 7.5X6 YLW CONV (MISCELLANEOUS) ×4 IMPLANT
PAD EYE OVAL STERILE LF (GAUZE/BANDAGES/DRESSINGS) ×4 IMPLANT
PAK VITRECTOMY PIK  23GA (OPHTHALMIC RELATED) ×2 IMPLANT
PENCIL BIPOLAR 25GA STR DISP (OPHTHALMIC RELATED) IMPLANT
PHACO TIP KELMAN 45DEG (TIP) IMPLANT
PROBE DIRECTIONAL LASER (MISCELLANEOUS) IMPLANT
ROLLS DENTAL (MISCELLANEOUS) ×4 IMPLANT
SCRAPER DIAMOND DUST MEMBRANE (MISCELLANEOUS) IMPLANT
SET FLUID INJECTOR (SET/KITS/TRAYS/PACK) IMPLANT
SET VGFI TUBING 8065808002 (SET/KITS/TRAYS/PACK) IMPLANT
SHUTTLE MONARCH TYPE A (NEEDLE) IMPLANT
SOLUTION ANTI FOG 6CC (MISCELLANEOUS) ×2 IMPLANT
SPEAR EYE SURG WECK-CEL (MISCELLANEOUS) ×6 IMPLANT
SUT ETHILON 10 0 CS140 6 (SUTURE) IMPLANT
SUT ETHILON 4 0 P 3 18 (SUTURE) IMPLANT
SUT ETHILON 5 0 P 3 18 (SUTURE)
SUT ETHILON 9 0 TG140 8 (SUTURE) IMPLANT
SUT NYLON 10.0 BLK (SUTURE) IMPLANT
SUT NYLON ETHILON 5-0 P-3 1X18 (SUTURE) IMPLANT
SUT PLAIN 6 0 TG1408 (SUTURE) IMPLANT
SUT POLY NON ABSORB 10-0 8 STR (SUTURE) IMPLANT
SUT VICRYL 7 0 TG140 8 (SUTURE) IMPLANT
SUT VICRYL ABS 6-0 S29 18IN (SUTURE) IMPLANT
SYR 20CC LL (SYRINGE) ×2 IMPLANT
SYR 5ML LL (SYRINGE) IMPLANT
SYR TB 1ML LUER SLIP (SYRINGE) ×2 IMPLANT
SYRINGE 10CC LL (SYRINGE) IMPLANT
TAPE PAPER MEDFIX 1IN X 10YD (GAUZE/BANDAGES/DRESSINGS) ×2 IMPLANT
TOWEL OR 17X24 6PK STRL BLUE (TOWEL DISPOSABLE) ×6 IMPLANT
TUBE EXTENSION HAMMER (TUBING) IMPLANT
WATER STERILE IRR 1000ML POUR (IV SOLUTION) ×2 IMPLANT
WIPE INSTRUMENT VISIWIPE 73X73 (MISCELLANEOUS) ×2 IMPLANT

## 2011-11-29 NOTE — Anesthesia Postprocedure Evaluation (Signed)
  Anesthesia Post-op Note  Patient: [name]  Procedure(s) Performed: Procedure(s) (LRB) with comments: PARS PLANA VITRECTOMY WITH 23 GAUGE (Right) - Right Eye 23 ga vitrectomy with membrane peel  Patient Location: PACU  Anesthesia Type:General  Level of Consciousness: awake  Airway and Oxygen Therapy: Patient Spontanous Breathing  Post-op Pain: mild  Post-op Assessment: Post-op Vital signs reviewed  Post-op Vital Signs: stable  Complications: No apparent anesthesia complications

## 2011-11-29 NOTE — H&P (View-Only) (Signed)
Pre-operative History and Physical for Ophthalmic Surgery  [name] 10/30/2011                  Chief Complaint: Vitreous floaters, decreased visio9n  Diagnosis: Vitreous Hemorrhage, Proliferative Diabetic Retinopathy Right Eye  Allergies  Allergen Reactions  . Ace Inhibitors   . Enalapril   . Lisinopril     REACTION: unspecified  . Omnipaque (Iohexol)      Prior to Admission medications   Medication Sig Start Date End Date Taking? Authorizing Provider  amiodarone (PACERONE) 200 MG tablet Tuesday, Thursday, Saturday, Sunday 05/02/11   Steven C Klein, MD  amiodarone (PACERONE) 200 MG tablet TAKE ONE TABLET BY MOUTH ONE TIME DAILY 07/25/11   Steven C Klein, MD  aspirin 81 MG chewable tablet Chew 81 mg by mouth daily.      Historical Provider, MD  calcium acetate (PHOSLO) 667 MG capsule Take 2,001 mg by mouth 3 (three) times daily. Take 2 capsules in the middle of each meal each day    Historical Provider, MD  insulin lispro protamine-insulin lispro (HUMALOG 50/50) (50-50) 100 UNIT/ML SUSP Inject 20 Units into the skin daily with breakfast.     Historical Provider, MD  isosorbide mononitrate (IMDUR) 60 MG 24 hr tablet Take 60 mg by mouth daily.    Historical Provider, MD  isosorbide mononitrate (IMDUR) 60 MG 24 hr tablet TAKE ONE TABLET BY MOUTH ONE TIME DAILY 07/25/11   Steven C Klein, MD  metoprolol (LOPRESSOR) 100 MG tablet Take 50 mg by mouth 2 (two) times daily. Non dialysis days only  06/01/10   Aleksei V Plotnikov, MD  Ostomy Supplies (ACTIVE LIFE CONVEX 1-PC 19MM) POUCH MISC 1 Device by Does not apply route every 3 (three) days. 04/11/11   Aleksei V Plotnikov, MD  Ostomy Supplies (NATURA DURAHESIVE MOLDABLE) WAFR 1 Wafer by Does not apply route 3 (three) times a week. 04/11/11   Aleksei V Plotnikov, MD  Ostomy Supplies (SUR-FIT NATURA UROSTOMY POUCH) POUCH MISC 1 Device by Does not apply route every 3 (three) days. 04/11/11   Aleksei V Plotnikov, MD  simvastatin (ZOCOR) 10 MG  tablet Take 10 mg by mouth daily.    Historical Provider, MD  sodium chloride 0.9 % SOLN 100 mL with DAPTOmycin 500 MG SOLR 757 mg Inject 757 mg into the vein every Monday, Wednesday, and Friday with hemodialysis. 05/27/11   Belkys A Regalado, MD  terbinafine (LAMISIL) 250 MG tablet Take 250 mg by mouth daily. Non-dialysis days only    Historical Provider, MD    Planned Procedure: Pars Plana Vitrectomy, Membrane Peeling and Endolaser Right Eye   There were no vitals filed for this visit.  Pulse: 76         Temp: NE        Resp:  16      ROS: negative  Past Medical History  Diagnosis Date  . LBBB (left bundle branch block)   . Other and unspecified hyperlipidemia   . Mitral valve insufficiency and aortic valve insufficiency   . Cardiomyopathy- mixed   . Unspecified essential hypertension   . Type II or unspecified type diabetes mellitus without mention of complication, not stated as uncontrolled   . Personal history of malignant neoplasm of large intestine   . Renal failure     ESRD, Dr Dunham  . CHF (congestive heart failure)   . Stroke   . Anemia   . Cancer     history of colon cancer  .   Coronary artery disease     severely decreased EF.   . Atrial tachycardia     on amiod    Past Surgical History  Procedure Date  . Colostomy   . Revision urostomy cutaneous   . Esophagogastroduodenoscopy 03-18-04  . Electrocardiogram 04-27-06  . Placement of new left forearm arteriovenous graft 03-11-08  . Left heart catheterization and right heart catheterization 12-10    R. heart cath showed elevated left and right heart filling pressures w/ pulmonary artery pressure elevated mildly out of proportion to the wedge. The left heart cath showed diffuse distal vessel disease as well as a 75% stenosis in the mid circumflex w/ a 90% stenosis of the ostial first obtuse marginal. These lesions were in close proximity. there was a 60-70%mild RCA stenosis.   . Arteriovenous graft placement 2010  .  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 Marital Status: Single    Spouse Name: N/A    Number of Children: N/A  . Years of Education: N/A   Occupational History  . Not on file.   Social History Main Topics  . Smoking status: Never Smoker   . Smokeless tobacco: Never Used  . Alcohol Use: No  . Drug Use: No  . Sexually Active: No   Other Topics Concern  . Not on file   Social History Narrative  . No narrative on file     The following examination is for anesthesia clearance for minimally invasive Ophthalmic surgery. It is primarily to document heart and lung findings and is not intended to elucidate unknown general medical conditions inclusive of abdominal masses, lung lesions, etc.   General Constitution:  within normal limits   Alertness/Orientation:  Person, time place     yes   HEENT:  Eye Findings: decreased vision and floaters                   right eye  Neck: supple without masses  Chest/Lungs: clear to auscultation  Cardiac: Normal S1 and S2 without Murmur, S3 or S4  Neuro: non-focal   Impression: Proliferative Diabetic Retinopathy, Vitreous Hemorrhage Right Eye  Planned Procedure:  Pars Plana Vitrectomy, Membrane Peeling, Fluid Gas Exchange Right Eye     Brenlyn Beshara, MD    

## 2011-11-29 NOTE — Anesthesia Preprocedure Evaluation (Addendum)
Anesthesia Evaluation  Patient identified by MRN, date of birth, ID band Patient awake    Reviewed: Allergy & Precautions, H&P , NPO status , Patient's Chart, lab work & pertinent test results  Airway Mallampati: I TM Distance: >3 FB Neck ROM: Full    Dental  (+) Dental Advisory Given   Pulmonary          Cardiovascular hypertension, Pt. on medications + CAD and +CHF     Neuro/Psych    GI/Hepatic   Endo/Other  diabetes, Well Controlled, Type 2, Insulin Dependent and Oral Hypoglycemic Agents  Renal/GU      Musculoskeletal   Abdominal   Peds  Hematology   Anesthesia Other Findings   Reproductive/Obstetrics                          Anesthesia Physical Anesthesia Plan  ASA: III  Anesthesia Plan: General   Post-op Pain Management:    Induction: Intravenous  Airway Management Planned: Oral ETT  Additional Equipment:   Intra-op Plan:   Post-operative Plan:   Informed Consent: I have reviewed the patients History and Physical, chart, labs and discussed the procedure including the risks, benefits and alternatives for the proposed anesthesia with the patient or authorized representative who has indicated his/her understanding and acceptance.     Plan Discussed with: CRNA and Surgeon  Anesthesia Plan Comments:         Anesthesia Quick Evaluation

## 2011-11-29 NOTE — Transfer of Care (Signed)
Immediate Anesthesia Transfer of Care Note  Patient: [name]  Procedure(s) Performed: Procedure(s) (LRB) with comments: PARS PLANA VITRECTOMY WITH 23 GAUGE (Right)  Patient Location: PACU  Anesthesia Type:General  Level of Consciousness: awake, alert , oriented and patient cooperative  Airway & Oxygen Therapy: Patient Spontanous Breathing and Patient connected to nasal cannula oxygen  Post-op Assessment: Report given to PACU RN, Post -op Vital signs reviewed and stable and Patient moving all extremities X 4  Post vital signs: Reviewed and stable  Complications: No apparent anesthesia complications

## 2011-11-29 NOTE — Interval H&P Note (Signed)
History and Physical Interval Note:  11/29/2011 5:49 PM  [name]  has presented today for surgery, with the diagnosis of Proliferative Diabetic Retinopathy, Vitreous Hemorrhage Right Eye  The various methods of treatment have been discussed with the patient and family. After consideration of risks, benefits and other options for treatment, the patient has consented to  Procedure(s) (LRB) with comments: PARS PLANA VITRECTOMY WITH 23 GAUGE (Right) as a surgical intervention .  The patient's history has been reviewed, patient examined, no change in status, stable for surgery.  I have reviewed the patient's chart and labs.  Questions were answered to the patient's satisfaction.     Shade Flood, MD

## 2011-11-29 NOTE — Progress Notes (Signed)
[name] has been waiting for a ride home since 2100.  After multiple phone calls to family members, [name] was finally discharged into the care of her sister-in-law [name].  I reviewed her discharge instructions and answered her questions.

## 2011-11-29 NOTE — Progress Notes (Signed)
Pt. Meets [facility] discharge criteria.  Patient getting dressed.  Patient sitting in wheelchair drinking water, eating crackers, waiting on ride. Contacted family over phone.  Awaiting daughter for ride.

## 2011-11-29 NOTE — Progress Notes (Signed)
Holding area notified that pt. Wants to speak with Dr.Geiger before signing consent.

## 2011-11-29 NOTE — Preoperative (Signed)
Beta Blockers   Reason not to administer Beta Blockers:Concurrent use of intravenous inotropic medications during the perioperative 

## 2011-11-29 NOTE — Progress Notes (Signed)
Call to OR holding to inform them to recheck cbg at 1630 and IV NS is clamped off for transport. Site unremarkable.

## 2011-11-29 NOTE — Progress Notes (Signed)
Paged Dr. Bedelia Person to report patient's blood sugar of 76 and her feeling cold which is one ofher symptoms when her sugar drops. Patient alert, appropriate and oriented.

## 2011-11-29 NOTE — Op Note (Signed)
[name] 11/29/2011 Diagnosis: Proliferative Diabetic Retinopathy, Vitreous Hemorrhage  Procedure: Pars Plana Vitrectomy, Membrane Peeling and Endolaser Operative Eye:  right eye  Surgeon: Shade Flood Estimated Blood Loss: minimal Specimens for Pathology:  None Complications: none   The  patient was prepped and draped in the usual fashion for ocular surgery on the  right eye .  A solid lid speculum was placed. The conjunctiva was displaced with a cotton tipped applicator at the  7:30  meridian. A trocar/cannula was placed 3.5 mm from the surgical limbus. The cannula was visualized in the vitreous cavity. The infusion line was allowed to run and then clamped when placed at the cannula opening. The line was inserted and secured to the drape with an adhesive strip. Trocar/Cannulas were then placed at the 9:30 and 2:30 meridian. The light pipe and vitreous cutter were inserted into the vitreous cavity and the wide field lens was placed. She had marked inferior vitreous hemorrhage and fibrovascular proliferation over the posterior pole. Anterior posterior traction was removed with the vitreous cutter for 360 degrees. The cutter was then placed on suction mode and used to elevate the firmly attached posterior hyaloid face up to the arcade blood vessels. The vitreous cutter was used to delaminate fibrovascular tissue from the macula and arcade blood  Vessels. Upon completion of membrane removal, Panretinal photocoagulation inclusive of macular grid laser a a very low setting was performed.   The cannulas were removed from the 9:30 and 2:30 positions with concommitant tamponade using a cotton tipped applicator. Intravitreal Lucentis 0.5mg  was placed with a 30g needle. Subconjunctival injections of Ancef 100mg /0.61ml and Dexamethasone 4mg /55ml were placed in the infero-medial quadrant to avoid proximity to the cannula sites.   The infusion cannula was removed with concomitant tamponade with the cotton  tipped applicator leaving the ocular pressure less than 20 by palpation.  The speculum and drapes were removed and the eye was patched with Polymixin/Bacitracin ophthalmic ointment. An eye shield was placed and the patient was transferred alert and conversant with stable vital signs to the post operative recovery area.  Shade Flood MD

## 2011-11-30 ENCOUNTER — Encounter (HOSPITAL_COMMUNITY): Payer: Self-pay | Admitting: Ophthalmology

## 2011-11-30 LAB — GLUCOSE, CAPILLARY: Glucose-Capillary: 96 mg/dL (ref 70–99)

## 2011-12-09 DIAGNOSIS — N186 End stage renal disease: Secondary | ICD-10-CM | POA: Diagnosis not present

## 2011-12-11 DIAGNOSIS — N2581 Secondary hyperparathyroidism of renal origin: Secondary | ICD-10-CM | POA: Diagnosis not present

## 2011-12-11 DIAGNOSIS — Z992 Dependence on renal dialysis: Secondary | ICD-10-CM | POA: Diagnosis not present

## 2011-12-11 DIAGNOSIS — N186 End stage renal disease: Secondary | ICD-10-CM | POA: Diagnosis not present

## 2011-12-11 DIAGNOSIS — E119 Type 2 diabetes mellitus without complications: Secondary | ICD-10-CM | POA: Diagnosis not present

## 2011-12-11 DIAGNOSIS — D509 Iron deficiency anemia, unspecified: Secondary | ICD-10-CM | POA: Diagnosis not present

## 2011-12-14 ENCOUNTER — Ambulatory Visit (INDEPENDENT_AMBULATORY_CARE_PROVIDER_SITE_OTHER): Payer: Medicare Other | Admitting: Internal Medicine

## 2011-12-14 ENCOUNTER — Encounter: Payer: Self-pay | Admitting: Internal Medicine

## 2011-12-14 VITALS — BP 137/75 | HR 89 | Resp 18 | Ht 61.0 in | Wt 195.0 lb

## 2011-12-14 DIAGNOSIS — I10 Essential (primary) hypertension: Secondary | ICD-10-CM | POA: Diagnosis not present

## 2011-12-14 DIAGNOSIS — I5042 Chronic combined systolic (congestive) and diastolic (congestive) heart failure: Secondary | ICD-10-CM | POA: Diagnosis not present

## 2011-12-14 DIAGNOSIS — R55 Syncope and collapse: Secondary | ICD-10-CM | POA: Diagnosis not present

## 2011-12-14 DIAGNOSIS — I471 Supraventricular tachycardia: Secondary | ICD-10-CM

## 2011-12-14 DIAGNOSIS — I498 Other specified cardiac arrhythmias: Secondary | ICD-10-CM | POA: Diagnosis not present

## 2011-12-14 MED ORDER — METOPROLOL TARTRATE 100 MG PO TABS: 50.0000 mg | ORAL_TABLET | Freq: Two times a day (BID) | ORAL | Status: DC

## 2011-12-14 MED ORDER — AMIODARONE HCL 200 MG PO TABS: 200.0000 mg | ORAL_TABLET | Freq: Every day | ORAL | Status: DC

## 2011-12-14 MED ORDER — SIMVASTATIN 10 MG PO TABS: 10.0000 mg | ORAL_TABLET | Freq: Every day | ORAL | Status: DC

## 2011-12-14 MED ORDER — ISOSORBIDE MONONITRATE ER 60 MG PO TB24: 60.0000 mg | ORAL_TABLET | Freq: Every day | ORAL | Status: DC

## 2011-12-14 NOTE — Assessment & Plan Note (Signed)
Currently well controlled on amiodarone. We will check amiodarone surveillance laboratories he had dialysis. We'll decrease her dose from 1400--1000 mg a week.

## 2011-12-14 NOTE — Patient Instructions (Signed)
Reduce Amiodarone to only 5 days per week.  Your physician wants you to follow-up in: 6 months with Dr. Graciela Husbands. You will receive a reminder letter in the mail two months in advance. If you don't receive a letter, please call our office to schedule the follow-up appointment.

## 2011-12-14 NOTE — Progress Notes (Signed)
Patient Care Team: Tresa Garter, MD as PCP - General Cecille Aver, MD (Nephrology) Duke Salvia, MD (Cardiology) Romero Belling, MD as Attending Physician (Internal Medicine)   HPI  [name] is a 66 y.o. female seen in follow up for an atrial tachycardia that was responsive to amiodarone.  Remotely she had an echo that demonstrated severe left ventricular dysfunction at about 25%. she developed recurrent tachycardia.  December 2010 had a non-STEMI. Catheterization   demonstrated 60% mid RCA subtotal occlusion of the posterior descending 70 AV circ disease and a 30% proximal LAD. She also had distal disease in her diagonal and her LAD.  Ejection fraction however had improved to 45% and subsequent echoes over the last year have shown ejection fraction 55-65% on all occasions.     She has hypotension associated with dialysis and takes her metoprolol only on nondialysis days.   She has had no significant palpitations. She denies worsening of her shortness of breath, in fact it is somewhat better. She's had no chest pain or edema. She denies cough She has end-stage renal disease and is on dialysis.  she has chronic left bundle branch block   Past Medical History  Diagnosis Date  . LBBB (left bundle branch block)   . Hyperlipidemia   . Mitral valve insufficiency and aortic valve insufficiency   . Cardiomyopathy- mixed   . Unspecified essential hypertension   . 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 Personal history of malignant neoplasm of large intestine   . Renal failure     ESRD, Dr Eliott Nine  . Chronic diastolic heart failure   . Anemia   . Coronary artery disease     Previously decreased EF; echo 113 normal LV function  . Atrial tachycardia     on amiod  . Stroke   . Hernia, incisional     abd  . Cancer     history of colon cancer.  1986  . Arthritis     Past Surgical History  Procedure Date  .  Colostomy   . Revision urostomy cutaneous   . Esophagogastroduodenoscopy 03-18-04  . Electrocardiogram 04-27-06  . Placement of new left forearm arteriovenous graft 03-11-08  . Left heart catheterization and right heart catheterization 12-10    R. heart cath showed elevated left and right heart filling pressures w/ pulmonary artery pressure elevated mildly out of proportion to the wedge. The left heart cath showed diffuse distal vessel disease as well as a 75% stenosis in the mid circumflex w/ a 90% stenosis of the ostial first obtuse marginal. These lesions were in close proximity. there was a 60-70%mild RCA stenosis.   . Arteriovenous graft placement 2010  . 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 Colon surgery   . Cardiac catheterization   . Eye surgery     Cataract Left  . Pars plana vitrectomy 11/29/2011    Procedure: PARS PLANA VITRECTOMY WITH 23 GAUGE;  Surgeon: Shade Flood, MD;  Location: [facility] OR;  Service: Ophthalmology;  Laterality: Right;  Right Eye 23 ga vitrectomy with membrane peel    Current Outpatient Prescriptions  Medication Sig Dispense Refill  . amiodarone (PACERONE) 200 MG tablet Take 1 tablet (200 mg total) by mouth daily. Take 200mg  daily five days per week.  60 tablet  3  . aspirin 81 MG chewable tablet Chew 81 mg by mouth daily.        [name]  calcium acetate (PHOSLO) 667 MG capsule Take 2,001 mg by mouth 3 (three) times daily. Take 2 capsules in the middle of each meal each day      . insulin lispro protamine-insulin lispro (HUMALOG 50/50) (50-50) 100 UNIT/ML SUSP Inject 20 Units into the skin 2 (two) times daily before a meal.       . isosorbide mononitrate (IMDUR) 60 MG 24 hr tablet Take 1 tablet (60 mg total) by mouth daily.  90 tablet  3  . metoprolol (LOPRESSOR) 100 MG tablet Take 0.5 tablets (50 mg total) by mouth 2 (two) times daily. Non dialysis days only: Tues, Thurs, and Sat  90 tablet  3  . simvastatin (ZOCOR) 10 MG tablet Take  1 tablet (10 mg total) by mouth daily.  90 tablet  3  . [DISCONTINUED] amiodarone (PACERONE) 200 MG tablet Take 200 mg by mouth daily.       . [DISCONTINUED] metoprolol (LOPRESSOR) 100 MG tablet Take 50 mg by mouth 2 (two) times daily. Non dialysis days only: Tues, Thurs, and Sat        Allergies  Allergen Reactions  . Ace Inhibitors Cough  . Enalapril Cough  . Lisinopril Cough  . Omnipaque (Iohexol) Hives    Review of Systems negative except from HPI and PMH  Physical Exam BP 137/75  Pulse 89  Resp 18  Ht 5\' 1"  (1.549 m)  Wt 195 lb (88.451 kg)  BMI 36.84 kg/m2 Well developed and well nourished in no acute distress HENT normal E scleral and icterus clear Neck Supple JVP flat; carotids brisk and full Clear to ausculation  Regular rate and rhythm, 2/6 murmur at the left lower sternal border  Soft with active bowel sounds No clubbing cyanosis none Edema Alert and oriented, grossly normal motor and sensory function Skin Warm and Dry  Electrocardiogram demonstrates sinus rhythm at 89 Intervals 23/16/45 Axis is leftward and 50 Left bundle branch block  Assessment and  Plan

## 2011-12-14 NOTE — Assessment & Plan Note (Signed)
No recurrent syncope 

## 2011-12-14 NOTE — Assessment & Plan Note (Signed)
Well controlled on current regime 

## 2011-12-14 NOTE — Assessment & Plan Note (Signed)
Currently stable.

## 2011-12-15 ENCOUNTER — Telehealth: Payer: Self-pay | Admitting: *Deleted

## 2011-12-15 DIAGNOSIS — E785 Hyperlipidemia, unspecified: Secondary | ICD-10-CM

## 2011-12-15 NOTE — Telephone Encounter (Signed)
At last office visit pt was given a script to have hepatic and tsh drawn at dialysis. The [facility] [place] [facility] returned the fax stating they could not do the labs. Left message for pt to call to schedule time to come to this office for labs.

## 2011-12-27 ENCOUNTER — Encounter: Payer: Self-pay | Admitting: Internal Medicine

## 2011-12-27 DIAGNOSIS — E785 Hyperlipidemia, unspecified: Secondary | ICD-10-CM | POA: Diagnosis not present

## 2012-01-08 ENCOUNTER — Encounter: Payer: Self-pay | Admitting: *Deleted

## 2012-01-08 NOTE — Telephone Encounter (Signed)
Unable to reach pt or leave a message, letter mailed to pts home.

## 2012-01-09 DIAGNOSIS — N186 End stage renal disease: Secondary | ICD-10-CM | POA: Diagnosis not present

## 2012-01-12 DIAGNOSIS — D509 Iron deficiency anemia, unspecified: Secondary | ICD-10-CM | POA: Diagnosis not present

## 2012-01-12 DIAGNOSIS — N2581 Secondary hyperparathyroidism of renal origin: Secondary | ICD-10-CM | POA: Diagnosis not present

## 2012-01-12 DIAGNOSIS — Z992 Dependence on renal dialysis: Secondary | ICD-10-CM | POA: Diagnosis not present

## 2012-01-12 DIAGNOSIS — N390 Urinary tract infection, site not specified: Secondary | ICD-10-CM | POA: Diagnosis not present

## 2012-01-12 DIAGNOSIS — N186 End stage renal disease: Secondary | ICD-10-CM | POA: Diagnosis not present

## 2012-01-15 DIAGNOSIS — N2581 Secondary hyperparathyroidism of renal origin: Secondary | ICD-10-CM | POA: Diagnosis not present

## 2012-01-15 DIAGNOSIS — N390 Urinary tract infection, site not specified: Secondary | ICD-10-CM | POA: Diagnosis not present

## 2012-01-15 DIAGNOSIS — N186 End stage renal disease: Secondary | ICD-10-CM | POA: Diagnosis not present

## 2012-01-15 DIAGNOSIS — D509 Iron deficiency anemia, unspecified: Secondary | ICD-10-CM | POA: Diagnosis not present

## 2012-01-15 DIAGNOSIS — Z992 Dependence on renal dialysis: Secondary | ICD-10-CM | POA: Diagnosis not present

## 2012-01-16 ENCOUNTER — Ambulatory Visit: Payer: Medicare Other | Admitting: Endocrinology

## 2012-01-17 DIAGNOSIS — N390 Urinary tract infection, site not specified: Secondary | ICD-10-CM | POA: Diagnosis not present

## 2012-01-17 DIAGNOSIS — Z992 Dependence on renal dialysis: Secondary | ICD-10-CM | POA: Diagnosis not present

## 2012-01-17 DIAGNOSIS — N186 End stage renal disease: Secondary | ICD-10-CM | POA: Diagnosis not present

## 2012-01-17 DIAGNOSIS — N2581 Secondary hyperparathyroidism of renal origin: Secondary | ICD-10-CM | POA: Diagnosis not present

## 2012-01-17 DIAGNOSIS — D509 Iron deficiency anemia, unspecified: Secondary | ICD-10-CM | POA: Diagnosis not present

## 2012-01-19 DIAGNOSIS — N390 Urinary tract infection, site not specified: Secondary | ICD-10-CM | POA: Diagnosis not present

## 2012-01-19 DIAGNOSIS — D509 Iron deficiency anemia, unspecified: Secondary | ICD-10-CM | POA: Diagnosis not present

## 2012-01-19 DIAGNOSIS — Z992 Dependence on renal dialysis: Secondary | ICD-10-CM | POA: Diagnosis not present

## 2012-01-19 DIAGNOSIS — N186 End stage renal disease: Secondary | ICD-10-CM | POA: Diagnosis not present

## 2012-01-19 DIAGNOSIS — N2581 Secondary hyperparathyroidism of renal origin: Secondary | ICD-10-CM | POA: Diagnosis not present

## 2012-01-22 DIAGNOSIS — N390 Urinary tract infection, site not specified: Secondary | ICD-10-CM | POA: Diagnosis not present

## 2012-01-22 DIAGNOSIS — N186 End stage renal disease: Secondary | ICD-10-CM | POA: Diagnosis not present

## 2012-01-22 DIAGNOSIS — N2581 Secondary hyperparathyroidism of renal origin: Secondary | ICD-10-CM | POA: Diagnosis not present

## 2012-01-22 DIAGNOSIS — Z992 Dependence on renal dialysis: Secondary | ICD-10-CM | POA: Diagnosis not present

## 2012-01-22 DIAGNOSIS — D509 Iron deficiency anemia, unspecified: Secondary | ICD-10-CM | POA: Diagnosis not present

## 2012-01-24 DIAGNOSIS — N186 End stage renal disease: Secondary | ICD-10-CM | POA: Diagnosis not present

## 2012-01-24 DIAGNOSIS — D509 Iron deficiency anemia, unspecified: Secondary | ICD-10-CM | POA: Diagnosis not present

## 2012-01-24 DIAGNOSIS — N2581 Secondary hyperparathyroidism of renal origin: Secondary | ICD-10-CM | POA: Diagnosis not present

## 2012-01-24 DIAGNOSIS — Z992 Dependence on renal dialysis: Secondary | ICD-10-CM | POA: Diagnosis not present

## 2012-01-24 DIAGNOSIS — N390 Urinary tract infection, site not specified: Secondary | ICD-10-CM | POA: Diagnosis not present

## 2012-01-26 DIAGNOSIS — N390 Urinary tract infection, site not specified: Secondary | ICD-10-CM | POA: Diagnosis not present

## 2012-01-26 DIAGNOSIS — Z992 Dependence on renal dialysis: Secondary | ICD-10-CM | POA: Diagnosis not present

## 2012-01-26 DIAGNOSIS — D509 Iron deficiency anemia, unspecified: Secondary | ICD-10-CM | POA: Diagnosis not present

## 2012-01-26 DIAGNOSIS — N2581 Secondary hyperparathyroidism of renal origin: Secondary | ICD-10-CM | POA: Diagnosis not present

## 2012-01-26 DIAGNOSIS — N186 End stage renal disease: Secondary | ICD-10-CM | POA: Diagnosis not present

## 2012-01-29 DIAGNOSIS — N390 Urinary tract infection, site not specified: Secondary | ICD-10-CM | POA: Diagnosis not present

## 2012-01-29 DIAGNOSIS — Z992 Dependence on renal dialysis: Secondary | ICD-10-CM | POA: Diagnosis not present

## 2012-01-29 DIAGNOSIS — N2581 Secondary hyperparathyroidism of renal origin: Secondary | ICD-10-CM | POA: Diagnosis not present

## 2012-01-29 DIAGNOSIS — N186 End stage renal disease: Secondary | ICD-10-CM | POA: Diagnosis not present

## 2012-01-29 DIAGNOSIS — D509 Iron deficiency anemia, unspecified: Secondary | ICD-10-CM | POA: Diagnosis not present

## 2012-01-31 DIAGNOSIS — E1129 Type 2 diabetes mellitus with other diabetic kidney complication: Secondary | ICD-10-CM | POA: Diagnosis not present

## 2012-01-31 DIAGNOSIS — N186 End stage renal disease: Secondary | ICD-10-CM | POA: Diagnosis not present

## 2012-01-31 DIAGNOSIS — N390 Urinary tract infection, site not specified: Secondary | ICD-10-CM | POA: Diagnosis not present

## 2012-01-31 DIAGNOSIS — Z992 Dependence on renal dialysis: Secondary | ICD-10-CM | POA: Diagnosis not present

## 2012-01-31 DIAGNOSIS — N2581 Secondary hyperparathyroidism of renal origin: Secondary | ICD-10-CM | POA: Diagnosis not present

## 2012-01-31 DIAGNOSIS — D509 Iron deficiency anemia, unspecified: Secondary | ICD-10-CM | POA: Diagnosis not present

## 2012-02-02 DIAGNOSIS — D509 Iron deficiency anemia, unspecified: Secondary | ICD-10-CM | POA: Diagnosis not present

## 2012-02-02 DIAGNOSIS — N186 End stage renal disease: Secondary | ICD-10-CM | POA: Diagnosis not present

## 2012-02-02 DIAGNOSIS — Z992 Dependence on renal dialysis: Secondary | ICD-10-CM | POA: Diagnosis not present

## 2012-02-02 DIAGNOSIS — N2581 Secondary hyperparathyroidism of renal origin: Secondary | ICD-10-CM | POA: Diagnosis not present

## 2012-02-02 DIAGNOSIS — N390 Urinary tract infection, site not specified: Secondary | ICD-10-CM | POA: Diagnosis not present

## 2012-02-05 DIAGNOSIS — Z992 Dependence on renal dialysis: Secondary | ICD-10-CM | POA: Diagnosis not present

## 2012-02-05 DIAGNOSIS — D509 Iron deficiency anemia, unspecified: Secondary | ICD-10-CM | POA: Diagnosis not present

## 2012-02-05 DIAGNOSIS — N390 Urinary tract infection, site not specified: Secondary | ICD-10-CM | POA: Diagnosis not present

## 2012-02-05 DIAGNOSIS — N2581 Secondary hyperparathyroidism of renal origin: Secondary | ICD-10-CM | POA: Diagnosis not present

## 2012-02-05 DIAGNOSIS — N186 End stage renal disease: Secondary | ICD-10-CM | POA: Diagnosis not present

## 2012-02-07 DIAGNOSIS — N2581 Secondary hyperparathyroidism of renal origin: Secondary | ICD-10-CM | POA: Diagnosis not present

## 2012-02-07 DIAGNOSIS — N390 Urinary tract infection, site not specified: Secondary | ICD-10-CM | POA: Diagnosis not present

## 2012-02-07 DIAGNOSIS — N186 End stage renal disease: Secondary | ICD-10-CM | POA: Diagnosis not present

## 2012-02-07 DIAGNOSIS — Z992 Dependence on renal dialysis: Secondary | ICD-10-CM | POA: Diagnosis not present

## 2012-02-07 DIAGNOSIS — D509 Iron deficiency anemia, unspecified: Secondary | ICD-10-CM | POA: Diagnosis not present

## 2012-02-09 DIAGNOSIS — N390 Urinary tract infection, site not specified: Secondary | ICD-10-CM | POA: Diagnosis not present

## 2012-02-09 DIAGNOSIS — D509 Iron deficiency anemia, unspecified: Secondary | ICD-10-CM | POA: Diagnosis not present

## 2012-02-09 DIAGNOSIS — N186 End stage renal disease: Secondary | ICD-10-CM | POA: Diagnosis not present

## 2012-02-09 DIAGNOSIS — Z992 Dependence on renal dialysis: Secondary | ICD-10-CM | POA: Diagnosis not present

## 2012-02-09 DIAGNOSIS — N2581 Secondary hyperparathyroidism of renal origin: Secondary | ICD-10-CM | POA: Diagnosis not present

## 2012-02-12 DIAGNOSIS — N2581 Secondary hyperparathyroidism of renal origin: Secondary | ICD-10-CM | POA: Diagnosis not present

## 2012-02-12 DIAGNOSIS — D631 Anemia in chronic kidney disease: Secondary | ICD-10-CM | POA: Diagnosis not present

## 2012-02-12 DIAGNOSIS — N390 Urinary tract infection, site not specified: Secondary | ICD-10-CM | POA: Diagnosis not present

## 2012-02-12 DIAGNOSIS — D509 Iron deficiency anemia, unspecified: Secondary | ICD-10-CM | POA: Diagnosis not present

## 2012-02-12 DIAGNOSIS — Z992 Dependence on renal dialysis: Secondary | ICD-10-CM | POA: Diagnosis not present

## 2012-02-12 DIAGNOSIS — N186 End stage renal disease: Secondary | ICD-10-CM | POA: Diagnosis not present

## 2012-02-15 ENCOUNTER — Other Ambulatory Visit: Payer: Self-pay | Admitting: Ophthalmology

## 2012-02-15 DIAGNOSIS — E11359 Type 2 diabetes mellitus with proliferative diabetic retinopathy without macular edema: Secondary | ICD-10-CM | POA: Diagnosis not present

## 2012-02-15 DIAGNOSIS — H4310 Vitreous hemorrhage, unspecified eye: Secondary | ICD-10-CM | POA: Diagnosis not present

## 2012-02-15 NOTE — H&P (Signed)
Pre-operative History and Physical for Ophthalmic Surgery  [name] 02/15/2012                  Chief Complaint: Decreased vision left eye  Diagnosis: Proliferative Diabetic Retinopathy,  Vitreous Hemorrhage Left Eye  Allergies  Allergen Reactions  . Ace Inhibitors Cough  . Enalapril Cough  . Lisinopril Cough  . Omnipaque (Iohexol) Hives    Prior to Admission medications   Medication Sig Start Date End Date Taking? Authorizing Provider  amiodarone (PACERONE) 200 MG tablet Take 1 tablet (200 mg total) by mouth daily. Take 200mg  daily five days per week. 12/14/11   Duke Salvia, MD  aspirin 81 MG chewable tablet Chew 81 mg by mouth daily.      Historical Provider, MD  calcium acetate (PHOSLO) 667 MG capsule Take 2,001 mg by mouth 3 (three) times daily. Take 2 capsules in the middle of each meal each day    Historical Provider, MD  insulin lispro protamine-insulin lispro (HUMALOG 50/50) (50-50) 100 UNIT/ML SUSP Inject 20 Units into the skin 2 (two) times daily before a meal.     Historical Provider, MD  isosorbide mononitrate (IMDUR) 60 MG 24 hr tablet Take 1 tablet (60 mg total) by mouth daily. 12/14/11   Duke Salvia, MD  metoprolol (LOPRESSOR) 100 MG tablet Take 0.5 tablets (50 mg total) by mouth 2 (two) times daily. Non dialysis days only: Ulanda Edison, and Sat 12/14/11   Duke Salvia, MD  simvastatin (ZOCOR) 10 MG tablet Take 1 tablet (10 mg total) by mouth daily. 12/14/11   Duke Salvia, MD    Planned Procedure: Caryl Asp Vitrectomy, Membrane Peeling and Endolaser  Pulse: 80         Temp: NE        Resp:  16      Past Medical History  Diagnosis Date  . LBBB (left bundle branch block)   . Hyperlipidemia   . Mitral valve insufficiency and aortic valve insufficiency   . Cardiomyopathy- mixed   . Unspecified essential hypertension   . 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 Personal history of malignant neoplasm of  large intestine   . Renal failure     ESRD, Dr Eliott Nine  . Chronic diastolic heart failure   . Anemia   . Coronary artery disease     Previously decreased EF; echo 113 normal LV function  . Atrial tachycardia     on amiod  . Stroke   . Hernia, incisional     abd  . Cancer     history of colon cancer.  1986  . Arthritis     Past Surgical History  Procedure Date  . Colostomy   . Revision urostomy cutaneous   . Esophagogastroduodenoscopy 03-18-04  . Electrocardiogram 04-27-06  . Placement of new left forearm arteriovenous graft 03-11-08  . Left heart catheterization and right heart catheterization 12-10    R. heart cath showed elevated left and right heart filling pressures w/ pulmonary artery pressure elevated mildly out of proportion to the wedge. The left heart cath showed diffuse distal vessel disease as well as a 75% stenosis in the mid circumflex w/ a 90% stenosis of the ostial first obtuse marginal. These lesions were in close proximity. there was a 60-70%mild RCA stenosis.   . Arteriovenous graft placement 2010  . 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  Colon surgery   . Cardiac catheterization   . Eye surgery     Cataract Left  . Pars plana vitrectomy 11/29/2011    Procedure: PARS PLANA VITRECTOMY WITH 23 GAUGE;  Surgeon: Shade Flood, MD;  Location: [facility] OR;  Service: Ophthalmology;  Laterality: Right;  Right Eye 23 ga vitrectomy with membrane peel     History   Social History  . Marital Status: Single    Spouse Name: N/A    Number of Children: N/A  . Years of Education: N/A   Occupational History  . Not on file.   Social History Main Topics  . Smoking status: Never Smoker   . Smokeless tobacco: Never Used  . Alcohol Use: No  . Drug Use: No  . Sexually Active: No   Other Topics Concern  . Not on file   Social History Narrative  . No narrative on file     The following examination is for anesthesia clearance for  minimally invasive Ophthalmic surgery. It is primarily to document heart and lung findings and is not intended to elucidate unknown general medical conditions inclusive of abdominal masses, lung lesions, etc.   General Constitution:  Obese, otherwise stable   Alertness/Orientation:  Person, time place     yes   HEENT:  Eye Findings: Proliferative Diabetic Retinopathy, Vitreous Hemorrhage Left Eye               Neck: supple without masses  Chest/Lungs:clear to auscultation  Cardiac: Normal S1 and S2 without Murmur, S3 or S4  Neuro: non-focal  Impression: Proliferative Diabetic Retinopathy,  Vitreous Hemorrhage Left Eye  Planned Procedure:  Pars Plana Vitrectomy, Membrane Peeling, Yancey Flemings, MD

## 2012-02-26 ENCOUNTER — Encounter (HOSPITAL_COMMUNITY): Payer: Self-pay | Admitting: Pharmacy Technician

## 2012-02-27 ENCOUNTER — Encounter (HOSPITAL_COMMUNITY): Payer: Self-pay

## 2012-02-27 MED ORDER — TETRACAINE HCL 0.5 % OP SOLN
2.0000 [drp] | OPHTHALMIC | Status: AC
  Administered 2012-02-28: 2 [drp] via OPHTHALMIC
  Filled 2012-02-27: qty 2

## 2012-02-27 MED ORDER — PREDNISOLONE ACETATE 1 % OP SUSP
1.0000 [drp] | OPHTHALMIC | Status: AC
  Administered 2012-02-28: 1 [drp] via OPHTHALMIC
  Filled 2012-02-27: qty 5

## 2012-02-27 MED ORDER — GATIFLOXACIN 0.5 % OP SOLN
1.0000 [drp] | OPHTHALMIC | Status: AC | PRN
  Administered 2012-02-28 (×3): 1 [drp] via OPHTHALMIC
  Filled 2012-02-27: qty 2.5

## 2012-02-27 MED ORDER — HOMATROPINE HBR 2 % OP SOLN
1.0000 [drp] | OPHTHALMIC | Status: DC | PRN
  Filled 2012-02-27 (×2): qty 5

## 2012-02-27 MED ORDER — PHENYLEPHRINE HCL 2.5 % OP SOLN
1.0000 [drp] | OPHTHALMIC | Status: AC | PRN
  Administered 2012-02-28 (×3): 1 [drp] via OPHTHALMIC
  Filled 2012-02-27: qty 3

## 2012-02-27 NOTE — Progress Notes (Signed)
Confirmed with Dr. Clarisa Kindred, patients surgery time of 8:25am, to arrive at 6:25am. Notified patient of times.

## 2012-02-27 NOTE — Progress Notes (Signed)
Pt notified of time change;to arrive at 0630 

## 2012-02-28 ENCOUNTER — Encounter (HOSPITAL_COMMUNITY): Payer: Self-pay | Admitting: Anesthesiology

## 2012-02-28 ENCOUNTER — Ambulatory Visit (HOSPITAL_COMMUNITY): Payer: Medicare Other | Admitting: Anesthesiology

## 2012-02-28 ENCOUNTER — Encounter (HOSPITAL_COMMUNITY): Payer: Self-pay | Admitting: Surgery

## 2012-02-28 ENCOUNTER — Ambulatory Visit (HOSPITAL_COMMUNITY)
Admission: RE | Admit: 2012-02-28 | Discharge: 2012-02-28 | Disposition: A | Discharge status: Home / Self Care | Payer: Medicare Other | Source: Ambulatory Visit | Attending: Ophthalmology | Admitting: Ophthalmology

## 2012-02-28 ENCOUNTER — Encounter (HOSPITAL_COMMUNITY)
Admission: RE | Disposition: A | Discharge status: Home / Self Care | Payer: Self-pay | Source: Ambulatory Visit | Attending: Ophthalmology

## 2012-02-28 DIAGNOSIS — H4310 Vitreous hemorrhage, unspecified eye: Secondary | ICD-10-CM | POA: Insufficient documentation

## 2012-02-28 DIAGNOSIS — N186 End stage renal disease: Secondary | ICD-10-CM | POA: Diagnosis not present

## 2012-02-28 DIAGNOSIS — E11359 Type 2 diabetes mellitus with proliferative diabetic retinopathy without macular edema: Secondary | ICD-10-CM | POA: Diagnosis not present

## 2012-02-28 DIAGNOSIS — Z794 Long term (current) use of insulin: Secondary | ICD-10-CM | POA: Insufficient documentation

## 2012-02-28 DIAGNOSIS — E1139 Type 2 diabetes mellitus with other diabetic ophthalmic complication: Secondary | ICD-10-CM | POA: Insufficient documentation

## 2012-02-28 DIAGNOSIS — I5032 Chronic diastolic (congestive) heart failure: Secondary | ICD-10-CM | POA: Insufficient documentation

## 2012-02-28 DIAGNOSIS — I120 Hypertensive chronic kidney disease with stage 5 chronic kidney disease or end stage renal disease: Secondary | ICD-10-CM | POA: Insufficient documentation

## 2012-02-28 HISTORY — PX: PARS PLANA VITRECTOMY: SHX2166

## 2012-02-28 LAB — BASIC METABOLIC PANEL
CO2: 23 mEq/L (ref 19–32)
Calcium: 8.9 mg/dL (ref 8.4–10.5)
Chloride: 97 mEq/L (ref 96–112)
Sodium: 137 mEq/L (ref 135–145)

## 2012-02-28 LAB — GLUCOSE, CAPILLARY: Glucose-Capillary: 85 mg/dL (ref 70–99)

## 2012-02-28 LAB — CBC
MCV: 88.7 fL (ref 78.0–100.0)
Platelets: 209 10*3/uL (ref 150–400)
RBC: 3.53 MIL/uL — ABNORMAL LOW (ref 3.87–5.11)
WBC: 6.4 10*3/uL (ref 4.0–10.5)

## 2012-02-28 SURGERY — PARS PLANA VITRECTOMY WITH 23 GAUGE
Anesthesia: Monitor Anesthesia Care | Site: Eye | Laterality: Left | Wound class: Clean

## 2012-02-28 MED ORDER — BUPIVACAINE HCL (PF) 0.75 % IJ SOLN
INTRAMUSCULAR | Status: DC | PRN
  Administered 2012-02-28: 10 mL

## 2012-02-28 MED ORDER — EPINEPHRINE HCL 1 MG/ML IJ SOLN
INTRAMUSCULAR | Status: AC
  Filled 2012-02-28: qty 1

## 2012-02-28 MED ORDER — SODIUM CHLORIDE 0.9 % IV SOLN
INTRAVENOUS | Status: DC | PRN
  Administered 2012-02-28: 09:00:00 via INTRAVENOUS

## 2012-02-28 MED ORDER — LIDOCAINE HCL 2 % IJ SOLN
INTRAMUSCULAR | Status: DC | PRN
  Administered 2012-02-28: 10 mL

## 2012-02-28 MED ORDER — SODIUM HYALURONATE 10 MG/ML IO SOLN
INTRAOCULAR | Status: AC
  Filled 2012-02-28: qty 0.85

## 2012-02-28 MED ORDER — HYPROMELLOSE (GONIOSCOPIC) 2.5 % OP SOLN
OPHTHALMIC | Status: AC
  Filled 2012-02-28: qty 15

## 2012-02-28 MED ORDER — NA CHONDROIT SULF-NA HYALURON 40-30 MG/ML IO SOLN
INTRAOCULAR | Status: AC
  Filled 2012-02-28: qty 1

## 2012-02-28 MED ORDER — BACITRACIN-POLYMYXIN B 500-10000 UNIT/GM OP OINT
TOPICAL_OINTMENT | OPHTHALMIC | Status: DC | PRN
  Administered 2012-02-28: 1 via OPHTHALMIC

## 2012-02-28 MED ORDER — BUPIVACAINE HCL (PF) 0.75 % IJ SOLN
INTRAMUSCULAR | Status: AC
  Filled 2012-02-28: qty 10

## 2012-02-28 MED ORDER — HYPROMELLOSE (GONIOSCOPIC) 2.5 % OP SOLN
OPHTHALMIC | Status: DC | PRN
  Administered 2012-02-28: 2 [drp] via OPHTHALMIC

## 2012-02-28 MED ORDER — TRIAMCINOLONE ACETONIDE 40 MG/ML IJ SUSP
INTRAMUSCULAR | Status: AC
  Filled 2012-02-28: qty 5

## 2012-02-28 MED ORDER — METOPROLOL TARTRATE 50 MG PO TABS
ORAL_TABLET | ORAL | Status: AC
  Filled 2012-02-28: qty 1

## 2012-02-28 MED ORDER — MIDAZOLAM HCL 5 MG/5ML IJ SOLN
INTRAMUSCULAR | Status: DC | PRN
  Administered 2012-02-28: 2 mg via INTRAVENOUS

## 2012-02-28 MED ORDER — PHENYLEPHRINE HCL 10 MG/ML IJ SOLN
INTRAMUSCULAR | Status: DC | PRN
  Administered 2012-02-28: 80 ug via INTRAVENOUS

## 2012-02-28 MED ORDER — DEXAMETHASONE SODIUM PHOSPHATE 10 MG/ML IJ SOLN
INTRAMUSCULAR | Status: AC
  Filled 2012-02-28: qty 1

## 2012-02-28 MED ORDER — BUPIVACAINE HCL (PF) 0.5 % IJ SOLN
INTRAMUSCULAR | Status: AC
  Filled 2012-02-28: qty 30

## 2012-02-28 MED ORDER — CEFAZOLIN SUBCONJUNCTIVAL INJECTION 100 MG/0.5 ML
200.0000 mg | INJECTION | SUBCONJUNCTIVAL | Status: AC
  Administered 2012-02-28: 100 mg via SUBCONJUNCTIVAL
  Filled 2012-02-28: qty 1

## 2012-02-28 MED ORDER — TETRACAINE HCL 0.5 % OP SOLN
OPHTHALMIC | Status: AC
  Filled 2012-02-28: qty 2

## 2012-02-28 MED ORDER — BSS IO SOLN
INTRAOCULAR | Status: AC
  Filled 2012-02-28: qty 15

## 2012-02-28 MED ORDER — EPINEPHRINE HCL 1 MG/ML IJ SOLN
INTRAOCULAR | Status: DC | PRN
  Administered 2012-02-28: 09:00:00

## 2012-02-28 MED ORDER — BSS PLUS IO SOLN
INTRAOCULAR | Status: AC
  Filled 2012-02-28: qty 500

## 2012-02-28 MED ORDER — LIDOCAINE HCL (CARDIAC) 20 MG/ML IV SOLN
INTRAVENOUS | Status: DC | PRN
  Administered 2012-02-28: 50 mg via INTRAVENOUS

## 2012-02-28 MED ORDER — MUPIROCIN 2 % EX OINT
TOPICAL_OINTMENT | CUTANEOUS | Status: AC
  Administered 2012-02-28: 1
  Filled 2012-02-28: qty 22

## 2012-02-28 MED ORDER — PROPOFOL 10 MG/ML IV BOLUS
INTRAVENOUS | Status: DC | PRN
  Administered 2012-02-28: 40 mg via INTRAVENOUS

## 2012-02-28 MED ORDER — ACETAZOLAMIDE SODIUM 500 MG IJ SOLR
INTRAMUSCULAR | Status: AC
  Filled 2012-02-28: qty 500

## 2012-02-28 MED ORDER — DEXAMETHASONE SODIUM PHOSPHATE 10 MG/ML IJ SOLN
INTRAMUSCULAR | Status: DC | PRN
  Administered 2012-02-28: 10 mg

## 2012-02-28 MED ORDER — LIDOCAINE HCL 2 % IJ SOLN
INTRAMUSCULAR | Status: AC
  Filled 2012-02-28: qty 20

## 2012-02-28 MED ORDER — BACITRACIN-POLYMYXIN B 500-10000 UNIT/GM OP OINT
TOPICAL_OINTMENT | OPHTHALMIC | Status: AC
  Filled 2012-02-28: qty 3.5

## 2012-02-28 SURGICAL SUPPLY — 75 items
ACCESSORY FRAGMATOME (MISCELLANEOUS) IMPLANT
APL SRG 3 HI ABS STRL LF PLS (MISCELLANEOUS)
APPLICATOR COTTON TIP 6IN STRL (MISCELLANEOUS) ×2 IMPLANT
APPLICATOR DR MATTHEWS STRL (MISCELLANEOUS) IMPLANT
BAG FLD CLT MN 6.25X3.5 (WOUND CARE)
BAG MINI COLL DRAIN (WOUND CARE) IMPLANT
BLADE EYE MINI 60D BEAVER (BLADE) ×2 IMPLANT
BLADE KERATOME 2.75 (BLADE) IMPLANT
BLADE MVR KNIFE 19G (BLADE) ×2 IMPLANT
BLADE STAB KNIFE 15DEG (BLADE) ×2 IMPLANT
CANNULA DUAL BORE 23G (CANNULA) IMPLANT
CANNULA FLEX TIP 23G (CANNULA) IMPLANT
CLOTH BEACON ORANGE TIMEOUT ST (SAFETY) ×2 IMPLANT
CORDS BIPOLAR (ELECTRODE) IMPLANT
DRAPE INCISE 51X51 W/FILM STRL (DRAPES) ×2 IMPLANT
DRAPE OPHTHALMIC 77X100 STRL (CUSTOM PROCEDURE TRAY) ×2 IMPLANT
DRAPE POUCH INSTRU U-SHP 10X18 (DRAPES) ×2 IMPLANT
DRSG TEGADERM 4X4.75 (GAUZE/BANDAGES/DRESSINGS) ×2 IMPLANT
ERASER HMR WETFIELD 23G BP (MISCELLANEOUS) IMPLANT
FILTER BLUE MILLIPORE (MISCELLANEOUS) IMPLANT
GAS OPHTHALMIC (MISCELLANEOUS) IMPLANT
GLOVE SS BIOGEL STRL SZ 6.5 (GLOVE) ×2 IMPLANT
GLOVE SUPERSENSE BIOGEL SZ 6.5 (GLOVE) ×2
GOWN SRG XL XLNG 56XLVL 4 (GOWN DISPOSABLE) ×1 IMPLANT
GOWN STRL NON-REIN LRG LVL3 (GOWN DISPOSABLE) ×2 IMPLANT
GOWN STRL NON-REIN XL XLG LVL4 (GOWN DISPOSABLE) ×2
ILLUMINATOR WIDEFIELD DIFF (MISCELLANEOUS) ×2 IMPLANT
KIT BASIN OR (CUSTOM PROCEDURE TRAY) ×2 IMPLANT
KIT ROOM TURNOVER OR (KITS) ×2 IMPLANT
KNIFE CRESCENT 1.75 EDGEAHEAD (BLADE) ×2 IMPLANT
KNIFE GRIESHABER SHARP 2.5MM (MISCELLANEOUS) ×2 IMPLANT
MARKER SKIN DUAL TIP RULER LAB (MISCELLANEOUS) IMPLANT
MASK EYE SHIELD (GAUZE/BANDAGES/DRESSINGS) ×2 IMPLANT
NEEDLE 18GX1X1/2 (RX/OR ONLY) (NEEDLE) ×2 IMPLANT
NEEDLE 22X1 1/2 (OR ONLY) (NEEDLE) ×2 IMPLANT
NEEDLE 25GX 5/8IN NON SAFETY (NEEDLE) ×2 IMPLANT
NEEDLE FILTER BLUNT 18X 1/2SAF (NEEDLE) ×1
NEEDLE FILTER BLUNT 18X1 1/2 (NEEDLE) ×1 IMPLANT
NEEDLE HYPO 30X.5 LL (NEEDLE) ×4 IMPLANT
NS IRRIG 1000ML POUR BTL (IV SOLUTION) ×2 IMPLANT
PACK COMBINED CATERACT/VIT 23G (OPHTHALMIC RELATED) IMPLANT
PACK VITRECTOMY CUSTOM (CUSTOM PROCEDURE TRAY) ×2 IMPLANT
PACK VITRECTOMY PIC MCHSVP (PACKS) IMPLANT
PAD ARMBOARD 7.5X6 YLW CONV (MISCELLANEOUS) ×4 IMPLANT
PAD EYE OVAL STERILE LF (GAUZE/BANDAGES/DRESSINGS) ×2 IMPLANT
PAK VITRECTOMY PIK  23GA (OPHTHALMIC RELATED) ×2 IMPLANT
PENCIL BIPOLAR 25GA STR DISP (OPHTHALMIC RELATED) IMPLANT
PHACO TIP KELMAN 45DEG (TIP) IMPLANT
PROBE DIRECTIONAL LASER (MISCELLANEOUS) ×2 IMPLANT
ROLLS DENTAL (MISCELLANEOUS) IMPLANT
SCRAPER DIAMOND DUST MEMBRANE (MISCELLANEOUS) IMPLANT
SET FLUID INJECTOR (SET/KITS/TRAYS/PACK) IMPLANT
SET VGFI TUBING 8065808002 (SET/KITS/TRAYS/PACK) IMPLANT
SHUTTLE MONARCH TYPE A (NEEDLE) IMPLANT
SOLUTION ANTI FOG 6CC (MISCELLANEOUS) IMPLANT
SPEAR EYE SURG WECK-CEL (MISCELLANEOUS) ×6 IMPLANT
SUT ETHILON 10 0 CS140 6 (SUTURE) IMPLANT
SUT ETHILON 4 0 P 3 18 (SUTURE) IMPLANT
SUT ETHILON 5 0 P 3 18 (SUTURE)
SUT ETHILON 9 0 TG140 8 (SUTURE) IMPLANT
SUT NYLON 10.0 BLK (SUTURE) IMPLANT
SUT NYLON ETHILON 5-0 P-3 1X18 (SUTURE) IMPLANT
SUT PLAIN 6 0 TG1408 (SUTURE) IMPLANT
SUT POLY NON ABSORB 10-0 8 STR (SUTURE) IMPLANT
SUT VICRYL 7 0 TG140 8 (SUTURE) IMPLANT
SUT VICRYL ABS 6-0 S29 18IN (SUTURE) IMPLANT
SYR 20CC LL (SYRINGE) IMPLANT
SYR 5ML LL (SYRINGE) IMPLANT
SYR TB 1ML LUER SLIP (SYRINGE) ×2 IMPLANT
SYRINGE 10CC LL (SYRINGE) IMPLANT
TOWEL OR 17X24 6PK STRL BLUE (TOWEL DISPOSABLE) ×6 IMPLANT
TUBE EXTENSION HAMMER (TUBING) IMPLANT
WATER STERILE IRR 1000ML POUR (IV SOLUTION) ×2 IMPLANT
WIPE INSTRUMENT ADHESIVE BACK (MISCELLANEOUS) ×2 IMPLANT
WIPE INSTRUMENT VISIWIPE 73X73 (MISCELLANEOUS) ×2 IMPLANT

## 2012-02-28 NOTE — H&P (View-Only) (Signed)
Pre-operative History and Physical for Ophthalmic Surgery  [name] 02/15/2012                  Chief Complaint: Decreased vision left eye  Diagnosis: Proliferative Diabetic Retinopathy,  Vitreous Hemorrhage Left Eye  Allergies  Allergen Reactions  . Ace Inhibitors Cough  . Enalapril Cough  . Lisinopril Cough  . Omnipaque (Iohexol) Hives    Prior to Admission medications   Medication Sig Start Date End Date Taking? Authorizing Provider  amiodarone (PACERONE) 200 MG tablet Take 1 tablet (200 mg total) by mouth daily. Take 200mg daily five days per week. 12/14/11   Cynthia C Klein, MD  aspirin 81 MG chewable tablet Chew 81 mg by mouth daily.      Historical Provider, MD  calcium acetate (PHOSLO) 667 MG capsule Take 2,001 mg by mouth 3 (three) times daily. Take 2 capsules in the middle of each meal each day    Historical Provider, MD  insulin lispro protamine-insulin lispro (HUMALOG 50/50) (50-50) 100 UNIT/ML SUSP Inject 20 Units into the skin 2 (two) times daily before a meal.     Historical Provider, MD  isosorbide mononitrate (IMDUR) 60 MG 24 hr tablet Take 1 tablet (60 mg total) by mouth daily. 12/14/11   Cynthia C Klein, MD  metoprolol (LOPRESSOR) 100 MG tablet Take 0.5 tablets (50 mg total) by mouth 2 (two) times daily. Non dialysis days only: Tues, Thurs, and Sat 12/14/11   Cynthia C Klein, MD  simvastatin (ZOCOR) 10 MG tablet Take 1 tablet (10 mg total) by mouth daily. 12/14/11   Cynthia C Klein, MD    Planned Procedure: Pars Plana Vitrectomy, Membrane Peeling and Endolaser  Pulse: 80         Temp: NE        Resp:  16      Past Medical History  Diagnosis Date  . LBBB (left bundle branch block)   . Hyperlipidemia   . Mitral valve insufficiency and aortic valve insufficiency   . Cardiomyopathy- mixed   . Unspecified essential hypertension   . 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 Personal history of malignant neoplasm of  large intestine   . Renal failure     ESRD, [name]  . Chronic diastolic heart failure   . Anemia   . Coronary artery disease     Previously decreased EF; echo 113 normal LV function  . Atrial tachycardia     on amiod  . Stroke   . Hernia, incisional     abd  . Cancer     history of colon cancer.  1986  . Arthritis     Past Surgical History  Procedure Date  . Colostomy   . Revision urostomy cutaneous   . Esophagogastroduodenoscopy 03-18-04  . Electrocardiogram 04-27-06  . Placement of new left forearm arteriovenous graft 03-11-08  . Left heart catheterization and right heart catheterization 12-10    R. heart cath showed elevated left and right heart filling pressures w/ pulmonary artery pressure elevated mildly out of proportion to the wedge. The left heart cath showed diffuse distal vessel disease as well as a 75% stenosis in the mid circumflex w/ a 90% stenosis of the ostial first obtuse marginal. These lesions were in close proximity. there was a 60-70%mild RCA stenosis.   . Arteriovenous graft placement 2010  . 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   Colon surgery   . Cardiac catheterization   . Eye surgery     Cataract Left  . Pars plana vitrectomy 11/29/2011    Procedure: PARS PLANA VITRECTOMY WITH 23 GAUGE;  Surgeon: Cynthia Yom, MD;  Location: [facility] OR;  Service: Ophthalmology;  Laterality: Right;  Right Eye 23 ga vitrectomy with membrane peel     History   Social History  . Marital Status: Single    Spouse Name: N/A    Number of Children: N/A  . Years of Education: N/A   Occupational History  . Not on file.   Social History Main Topics  . Smoking status: Never Smoker   . Smokeless tobacco: Never Used  . Alcohol Use: No  . Drug Use: No  . Sexually Active: No   Other Topics Concern  . Not on file   Social History Narrative  . No narrative on file     The following examination is for anesthesia clearance for  minimally invasive Ophthalmic surgery. It is primarily to document heart and lung findings and is not intended to elucidate unknown general medical conditions inclusive of abdominal masses, lung lesions, etc.   General Constitution:  Obese, otherwise stable   Alertness/Orientation:  Person, time place     yes   HEENT:  Eye Findings: Proliferative Diabetic Retinopathy, Vitreous Hemorrhage Left Eye               Neck: supple without masses  Chest/Lungs:clear to auscultation  Cardiac: Normal S1 and S2 without Murmur, S3 or S4  Neuro: non-focal  Impression: Proliferative Diabetic Retinopathy,  Vitreous Hemorrhage Left Eye  Planned Procedure:  Pars Plana Vitrectomy, Membrane Peeling, Endolaser    Cynthia Kunert, MD      

## 2012-02-28 NOTE — Progress Notes (Signed)
Homatropine drops not in unit Pyxis.  Pharmacy notified and stated drops on back order.  Dr. Clarisa Kindred notified.

## 2012-02-28 NOTE — Transfer of Care (Signed)
Immediate Anesthesia Transfer of Care Note  Patient: [name]  Procedure(s) Performed: Procedure(s): PARS PLANA VITRECTOMY WITH 23 GAUGE (Left)  Patient Location: Short Stay  Anesthesia Type:MAC  Level of Consciousness: awake, alert  and oriented  Airway & Oxygen Therapy: Patient Spontanous Breathing  Post-op Assessment: Report given to PACU RN and Post -op Vital signs reviewed and stable  Post vital signs: Reviewed and stable  Complications: No apparent anesthesia complications

## 2012-02-28 NOTE — Op Note (Signed)
[name] 02/28/2012 Diagnosis: Proliferative Diabetic Retinopathy                     Vitreous Hemorrhage   Procedure: Pars Plana Vitrectomy, Membrane Peeling and Endolaser Operative Eye:  left eye  Surgeon: Shade Flood Estimated Blood Loss: minimal Specimens for Pathology:  None Complications: none   The  patient was prepped and draped in the usual fashion for ocular surgery on the  left eye .  A solid lid speculum was placed. The conjunctiva was displaced with a cotton tipped applicator at the  4:30  meridian. A trocar/cannula was placed 3.5 mm from the surgical limbus. The cannula was visualized in the vitreous cavity. The infusion line was allowed to run and then clamped when placed at the cannula opening. The line was inserted and secured to the drape with an adhesive strip. Trocar/Cannulas were then placed at the 9:30 and 2:30 meridian. The light pipe and vitreous cutter were inserted into the vitreous cavity and the wide field lens was placed. The patient was noted to have multiple fibrovascular attachments to the retina along with both sub-hyaloid hemorrhage and pre-retinal hemorrhage inferiorly.  Core vitrectomy was carried out uneventfully with care taken to remove the vitreous up to the vitreous base for 360 degrees. The vitreous cutter was then placed on suction mode and used to facilitate separating the posterior vitreous face from the retina in the periphery for 360 degrees. Multiple fibrovascular attachments were identified and separated from the retina in this manner. There was more adherent fibrovascular proliferation inferior to and along the inferior arcade blood vessel. The cutter was used to segment the fibrovascular tissue. The pick forceps were used to elevate the intervening sections of membrane from the retinal surface. The pick forceps were then used to remove a fibrovascular frond from the optic nerve head and remove it form the eye manually. Th remaining fibrovascular  segmented fragments were then delaminated from the retina with the vitreous cutter inferiorly. The vitreous cutter was used on suction mode to insure all remaining attached vitreous was separated from the posterior retina. The remaining posterior vitreous was removed with the cutter uneventfully. Full pan-retinal endolaser was then applied for 360 degrees. Any gap areas from previous laser were treated.  The cannulas were removed from the 9:30 and 2:30 positions with concommitant tamponade using a cotton tipped applicator. Subconjunctival injections of Ancef 100mg /0.30ml and Dexamethasone 4mg /18ml were placed in the infero-medial quadrant to avoid proximity to the cannula sites.   The infusion cannula was removed with concomitant tamponade with the cotton tipped applicator leaving the ocular pressure less than 20 by palpation.  The speculum and drapes were removed and the eye was patched with Polymixin/Bacitracin ophthalmic ointment. An eye shield was placed and the patient was transferred alert and conversant with stable vital signs to the post operative recovery area.  Shade Flood MD

## 2012-02-28 NOTE — Interval H&P Note (Signed)
History and Physical Interval Note:  02/28/2012 8:44 AM  [name]  has presented today for surgery, with the diagnosis of Proliferative Diabetic Retinopathy, Vitreous Hemorrhage Left Eye  The various methods of treatment have been discussed with the patient and family. After consideration of risks, benefits and other options for treatment, the patient has consented to  Procedure(s): PARS PLANA VITRECTOMY WITH 23 GAUGE (Left) as a surgical intervention .  The patient's history has been reviewed, patient examined, no change in status, stable for surgery.  I have reviewed the patient's chart and labs.  Questions were answered to the patient's satisfaction.     Jameica Couts, Waynette Buttery

## 2012-02-28 NOTE — Anesthesia Postprocedure Evaluation (Signed)
  Anesthesia Post-op Note  Patient: [name]  Procedure(s) Performed: Procedure(s): PARS PLANA VITRECTOMY WITH 23 GAUGE (Left)  Patient Location: PACU  Anesthesia Type:MAC  Level of Consciousness: awake  Airway and Oxygen Therapy: Patient Spontanous Breathing  Post-op Pain: mild  Post-op Assessment: Post-op Vital signs reviewed  Post-op Vital Signs: stable  Complications: No apparent anesthesia complications

## 2012-02-28 NOTE — Preoperative (Signed)
Beta Blockers   Reason not to administer Beta Blockers:Not Applicable 

## 2012-02-28 NOTE — Anesthesia Preprocedure Evaluation (Addendum)
Anesthesia Evaluation  Patient identified by MRN, date of birth, ID band Patient awake    Reviewed: Allergy & Precautions, H&P , NPO status , Patient's Chart, lab work & pertinent test results  History of Anesthesia Complications Negative for: history of anesthetic complications  Airway Mallampati: II TM Distance: >3 FB Neck ROM: Limited    Dental  (+) Teeth Intact, Missing, Partial Lower and Partial Upper   Pulmonary pneumonia -, resolved,  breath sounds clear to auscultation        Cardiovascular hypertension, Pt. on medications and Pt. on home beta blockers + CAD + dysrhythmias Supra Ventricular Tachycardia + Valvular Problems/Murmurs Rhythm:Regular Rate:Normal     Neuro/Psych CVA, Residual Symptoms negative psych ROS   GI/Hepatic negative GI ROS, Neg liver ROS,   Endo/Other  diabetes, Type 2, Insulin Dependent  Renal/GU ESRF and DialysisRenal disease     Musculoskeletal negative musculoskeletal ROS (+)   Abdominal (+) + obese,   Peds  Hematology negative hematology ROS (+)   Anesthesia Other Findings   Reproductive/Obstetrics negative OB ROS                         Anesthesia Physical Anesthesia Plan  ASA: IV  Anesthesia Plan: MAC   Post-op Pain Management:    Induction: Intravenous  Airway Management Planned: Simple Face Mask  Additional Equipment:   Intra-op Plan:   Post-operative Plan:   Informed Consent: I have reviewed the patients History and Physical, chart, labs and discussed the procedure including the risks, benefits and alternatives for the proposed anesthesia with the patient or authorized representative who has indicated his/her understanding and acceptance.     Plan Discussed with: CRNA and Surgeon  Anesthesia Plan Comments:         Anesthesia Quick Evaluation

## 2012-03-01 ENCOUNTER — Encounter (HOSPITAL_COMMUNITY): Payer: Self-pay | Admitting: Ophthalmology

## 2012-03-08 DIAGNOSIS — N186 End stage renal disease: Secondary | ICD-10-CM | POA: Diagnosis not present

## 2012-03-11 DIAGNOSIS — E1129 Type 2 diabetes mellitus with other diabetic kidney complication: Secondary | ICD-10-CM | POA: Diagnosis not present

## 2012-03-11 DIAGNOSIS — Z992 Dependence on renal dialysis: Secondary | ICD-10-CM | POA: Diagnosis not present

## 2012-03-11 DIAGNOSIS — N2581 Secondary hyperparathyroidism of renal origin: Secondary | ICD-10-CM | POA: Diagnosis not present

## 2012-03-11 DIAGNOSIS — N186 End stage renal disease: Secondary | ICD-10-CM | POA: Diagnosis not present

## 2012-03-11 DIAGNOSIS — N390 Urinary tract infection, site not specified: Secondary | ICD-10-CM | POA: Diagnosis not present

## 2012-03-11 DIAGNOSIS — D631 Anemia in chronic kidney disease: Secondary | ICD-10-CM | POA: Diagnosis not present

## 2012-03-11 DIAGNOSIS — D509 Iron deficiency anemia, unspecified: Secondary | ICD-10-CM | POA: Diagnosis not present

## 2012-03-18 ENCOUNTER — Other Ambulatory Visit: Payer: Self-pay | Admitting: *Deleted

## 2012-03-18 ENCOUNTER — Telehealth: Payer: Self-pay | Admitting: Internal Medicine

## 2012-03-18 DIAGNOSIS — I447 Left bundle-branch block, unspecified: Secondary | ICD-10-CM

## 2012-03-18 NOTE — Telephone Encounter (Signed)
New Problem:    Patient called in wanting to know where she would be able to have the blood test that Dr. Graciela Husbands wanted her to have done at [facility].  Patient stated that her [facility] did not draw those types of labs.  Please call back.

## 2012-03-18 NOTE — Telephone Encounter (Signed)
Labs ordered to be done here and pt notified.

## 2012-03-29 DIAGNOSIS — N390 Urinary tract infection, site not specified: Secondary | ICD-10-CM | POA: Diagnosis not present

## 2012-04-08 DIAGNOSIS — N186 End stage renal disease: Secondary | ICD-10-CM | POA: Diagnosis not present

## 2012-04-10 DIAGNOSIS — D631 Anemia in chronic kidney disease: Secondary | ICD-10-CM | POA: Diagnosis not present

## 2012-04-10 DIAGNOSIS — N186 End stage renal disease: Secondary | ICD-10-CM | POA: Diagnosis not present

## 2012-04-10 DIAGNOSIS — Z992 Dependence on renal dialysis: Secondary | ICD-10-CM | POA: Diagnosis not present

## 2012-04-10 DIAGNOSIS — D509 Iron deficiency anemia, unspecified: Secondary | ICD-10-CM | POA: Diagnosis not present

## 2012-04-10 DIAGNOSIS — N2581 Secondary hyperparathyroidism of renal origin: Secondary | ICD-10-CM | POA: Diagnosis not present

## 2012-05-01 DIAGNOSIS — E1129 Type 2 diabetes mellitus with other diabetic kidney complication: Secondary | ICD-10-CM | POA: Diagnosis not present

## 2012-05-02 ENCOUNTER — Ambulatory Visit: Payer: Medicare Other | Admitting: Endocrinology

## 2012-05-02 DIAGNOSIS — Z0289 Encounter for other administrative examinations: Secondary | ICD-10-CM

## 2012-05-10 DIAGNOSIS — Z992 Dependence on renal dialysis: Secondary | ICD-10-CM | POA: Diagnosis not present

## 2012-05-10 DIAGNOSIS — N2581 Secondary hyperparathyroidism of renal origin: Secondary | ICD-10-CM | POA: Diagnosis not present

## 2012-05-10 DIAGNOSIS — D509 Iron deficiency anemia, unspecified: Secondary | ICD-10-CM | POA: Diagnosis not present

## 2012-05-10 DIAGNOSIS — N186 End stage renal disease: Secondary | ICD-10-CM | POA: Diagnosis not present

## 2012-05-10 DIAGNOSIS — D631 Anemia in chronic kidney disease: Secondary | ICD-10-CM | POA: Diagnosis not present

## 2012-05-21 ENCOUNTER — Ambulatory Visit (INDEPENDENT_AMBULATORY_CARE_PROVIDER_SITE_OTHER): Payer: Medicare Other | Admitting: Internal Medicine

## 2012-05-21 VITALS — BP 122/68 | HR 90 | Ht 60.0 in | Wt 200.0 lb

## 2012-05-21 DIAGNOSIS — I639 Cerebral infarction, unspecified: Secondary | ICD-10-CM

## 2012-05-21 DIAGNOSIS — R05 Cough: Secondary | ICD-10-CM | POA: Insufficient documentation

## 2012-05-21 DIAGNOSIS — I2510 Atherosclerotic heart disease of native coronary artery without angina pectoris: Secondary | ICD-10-CM | POA: Diagnosis not present

## 2012-05-21 DIAGNOSIS — I498 Other specified cardiac arrhythmias: Secondary | ICD-10-CM | POA: Diagnosis not present

## 2012-05-21 DIAGNOSIS — I6350 Cerebral infarction due to unspecified occlusion or stenosis of unspecified cerebral artery: Secondary | ICD-10-CM | POA: Diagnosis not present

## 2012-05-21 LAB — HEPATIC FUNCTION PANEL
AST: 13 U/L (ref 0–37)
Alkaline Phosphatase: 67 U/L (ref 39–117)
Total Bilirubin: 0.3 mg/dL (ref 0.3–1.2)

## 2012-05-21 LAB — TSH: TSH: 0.55 u[IU]/mL (ref 0.35–5.50)

## 2012-05-21 NOTE — Assessment & Plan Note (Signed)
The most important concern that I have about her cough is that it could be an early manifestation of amiodarone lung toxicity. It is however a relatively acute onset and as such would not pursue an evaluation. If the cough persists, we will need to undertake amiodarone withdrawal trial

## 2012-05-21 NOTE — Patient Instructions (Signed)
Your physician recommends that you have lab work today: tsh/liver  Your physician has recommended you make the following change in your medication:  1) Decrease amiodarone to 200 mg 1/2 tablet by mouth once daily.  Your physician wants you to follow-up in: 6 months with Dr. Graciela Husbands. You will receive a reminder letter in the mail two months in advance. If you don't receive a letter, please call our office to schedule the follow-up appointment.

## 2012-05-21 NOTE — Assessment & Plan Note (Signed)
No chest pain. We'll continue her on her imdur

## 2012-05-21 NOTE — Assessment & Plan Note (Addendum)
No intercurrent afib  We will decrease her amiodarone from 1000-700 mg per week. We'll check amiodarone surveillance laboratories.

## 2012-05-21 NOTE — Assessment & Plan Note (Signed)
I wonder if this is the cause of some of her symptoms. Allow for follow up with Dr. Wendi Maya

## 2012-05-21 NOTE — Progress Notes (Signed)
Patient Care Team: Tresa Garter, MD as PCP - General Cecille Aver, MD (Nephrology) Duke Salvia, MD (Cardiology) Romero Belling, MD as Attending Physician (Internal Medicine)   HPI  [name] is a 67 y.o. female  seen in follow up for an atrial tachycardia that was responsive to amiodarone. At her last visit, her amio decreased from 1400>>1000 mg /week    She has end-stage renal disease and is on dialysis.   Remotely she had an echo that demonstrated severe left ventricular dysfunction at about 25%. She developed recurrent tachycardia in December 2010 with a non-STEMI. Catheterization demonstrated 60% mid RCA; subtotal occlusion of the posterior descending 70 AV circ disease and a 30% proximal LAD. She also had distal disease in her diagonal and her LAD.   Ejection fraction however had improved to 45% and subsequent echoes over the last year have shown ejection fraction 55-65% on all occasions. She has chronic left bundle branch block  She has had no significant palpitations  She has complaints of this to her right hand. This is not necessarily related to awakening.  She also notes a cough which has been persistent for about 2-3 weeks. Her daughter says that is longer than that.   Past Medical History  Diagnosis Date  . Hyperlipidemia   . Mitral valve insufficiency and aortic valve insufficiency   . Cardiomyopathy- mixed   . 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 Personal history of malignant neoplasm of large intestine   . Chronic diastolic heart failure   . Anemia   . Coronary artery disease     Previously decreased EF; echo 113 normal LV function  . Hernia, incisional     abd  . Cancer     history of colon cancer.  1986  . Arthritis   . LBBB (left bundle branch block)     sees Dr. Graciela Husbands  . Atrial tachycardia     on amiod  . Unspecified essential hypertension     sees Dr. Posey Rea  . Stroke     2011/12  . Renal failure     ESRD, Dr Dunham/Dr. Layla Barter.  M, W, Fr    Past Surgical History  Procedure Laterality Date  . Colostomy    . Revision urostomy cutaneous    . Esophagogastroduodenoscopy  03-18-04  . Electrocardiogram  04-27-06  . Placement of new left forearm arteriovenous graft  03-11-08  . Left heart catheterization and right heart catheterization  12-10    R. heart cath showed elevated left and right heart filling pressures w/ pulmonary artery pressure elevated mildly out of proportion to the wedge. The left heart cath showed diffuse distal vessel disease as well as a 75% stenosis in the mid circumflex w/ a 90% stenosis of the ostial first obtuse marginal. These lesions were in close proximity. there was a 60-70%mild RCA stenosis.   . Arteriovenous graft placement  2010  . 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 Colon surgery    . Cardiac catheterization    . Eye surgery      Cataract Left  . Pars plana vitrectomy  11/29/2011    Procedure: PARS PLANA VITRECTOMY WITH 23 GAUGE;  Surgeon: Shade Flood, MD;  Location: [facility] OR;  Service: Ophthalmology;  Laterality: Right;  Right Eye 23 ga vitrectomy with membrane peel  . Pars plana vitrectomy Left 02/28/2012    Procedure: PARS  PLANA VITRECTOMY WITH 23 GAUGE;  Surgeon: Shade Flood, MD;  Location: [facility] OR;  Service: Ophthalmology;  Laterality: Left;    Current Outpatient Prescriptions  Medication Sig Dispense Refill  . amiodarone (PACERONE) 200 MG tablet Take 200 mg by mouth daily. Daily except Wednesday and friday      . aspirin 81 MG chewable tablet Chew 81 mg by mouth daily.        . calcium acetate (PHOSLO) 667 MG capsule Take 2,001 mg by mouth 3 (three) times daily with meals. Take 2 capsules in the middle of each meal each day      . insulin lispro protamine-insulin lispro (HUMALOG 50/50) (50-50) 100 UNIT/ML SUSP Inject 10-20 Units into the skin 2 (two) times daily as needed (for low or  high sugar levels). Inject 10-20 units at least once daily.  Use twice daily if needed      . isosorbide mononitrate (IMDUR) 60 MG 24 hr tablet Take 1 tablet (60 mg total) by mouth daily.  90 tablet  3  . metoprolol (LOPRESSOR) 50 MG tablet Take 50 mg by mouth daily. Daily except on monday, Wednesday, and friday      . simvastatin (ZOCOR) 10 MG tablet Take 1 tablet (10 mg total) by mouth daily.  90 tablet  3   No current facility-administered medications for this visit.    Allergies  Allergen Reactions  . Ace Inhibitors Cough  . Eggs Or Egg-Derived Products Nausea And Vomiting  . Enalapril Cough  . Lisinopril Cough  . Omnipaque (Iohexol) Hives    Review of Systems negative except from HPI and PMH  Physical Exam BP 122/68  Pulse 90  Ht 5' (1.524 m)  Wt 200 lb (90.719 kg)  BMI 39.06 kg/m2 Well developed and well nourished in no acute distress HENT normal E scleral and icterus clear Neck Supple JVP flat; carotids brisk and full Clear to ausculation Regular rate and rhythm, no murmurs gallops or rub Soft with active bowel sounds No clubbing cyanosis none Edema Alert and oriented,  She has good strength in her right thumb opposition. She is not able to relax reflects Skin Warm and Dry  ECG demonstrates sinus rhythm with left bundle branch block and an   Assessment and  Plan

## 2012-05-23 IMAGING — CT CT ABD-PELV W/O CM
2 of 4 series · 17 of 46 positions shown, 19 images · non-contrast
Comparison: 10/28/2009

CLINICAL DATA: Weight loss.  Nausea and vomiting.  The

CT ABDOMEN AND PELVIS WITHOUT CONTRAST
TECHNIQUE: Multidetector CT imaging of the abdomen and pelvis was
performed following the standard protocol without intravenous
contrast.

[Series 2: abd/pelv w/o 5.0 b31f st · axial · non-contrast · 0.93mm/px · z∈[-458,-62]mm · 14 of 87 slices shown, 16 images]
[im 4/87  soft-tissue]
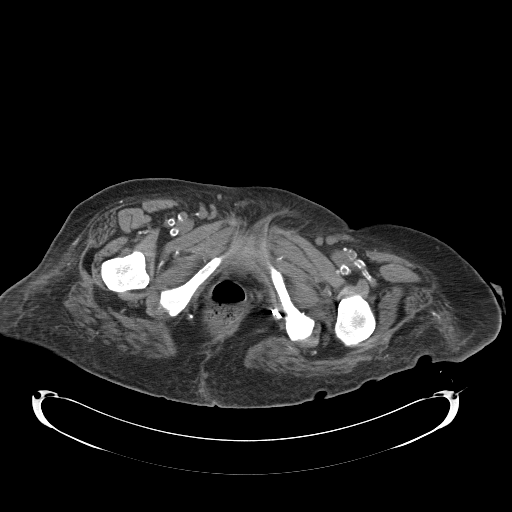
[im 4/87  bone]
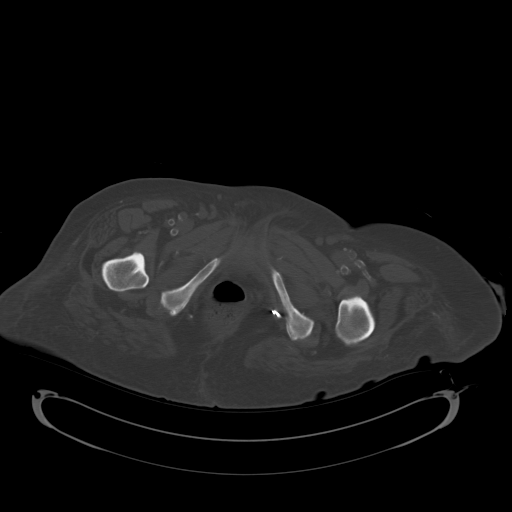
[im 11/87  soft-tissue]
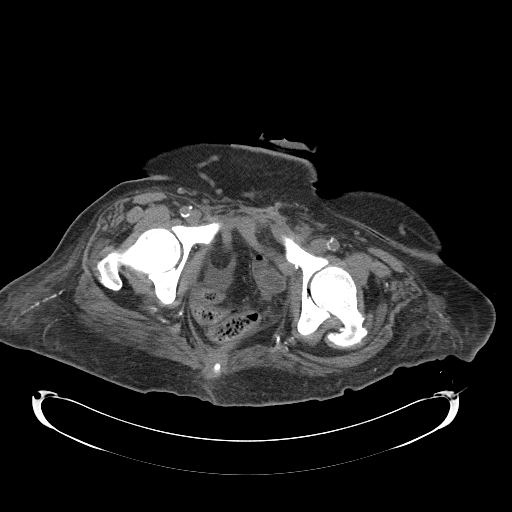
[im 18/87  soft-tissue]
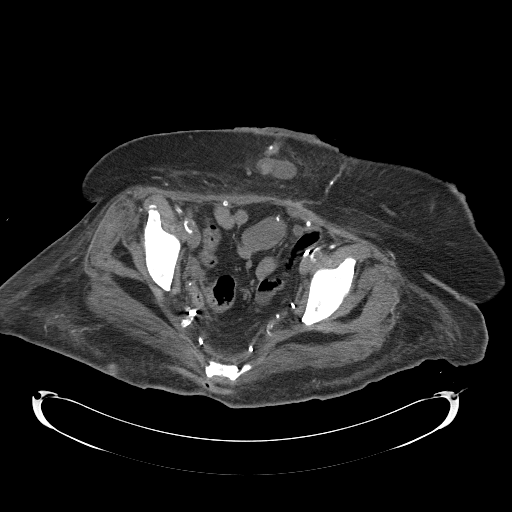
[im 25/87  soft-tissue]
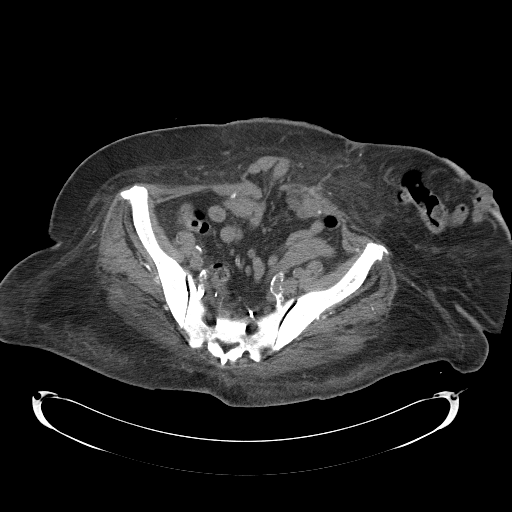
[im 28/87  soft-tissue]
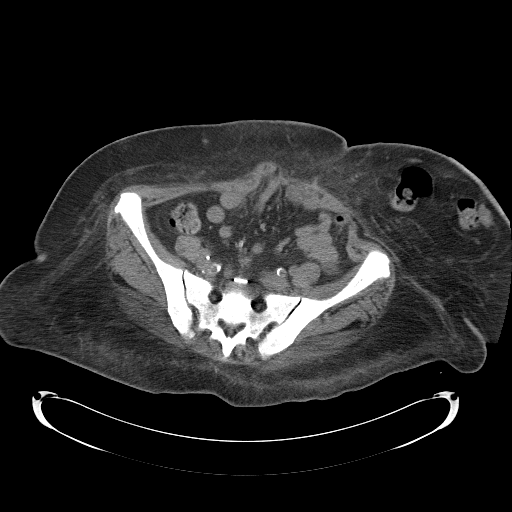
[im 35/87  soft-tissue]
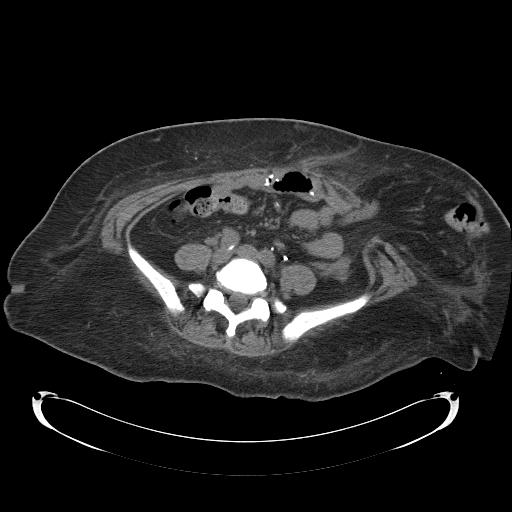
[im 42/87  soft-tissue]
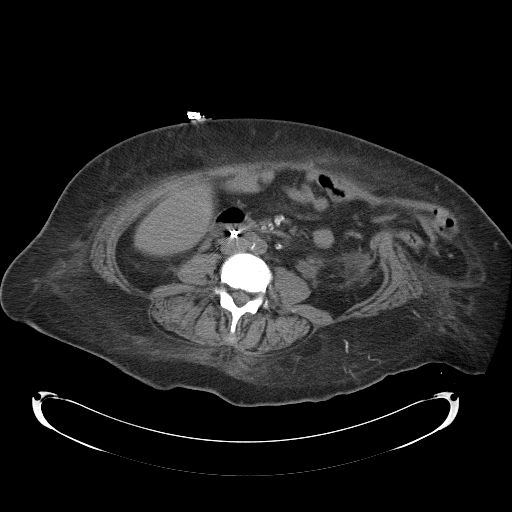
[im 45/87  soft-tissue]
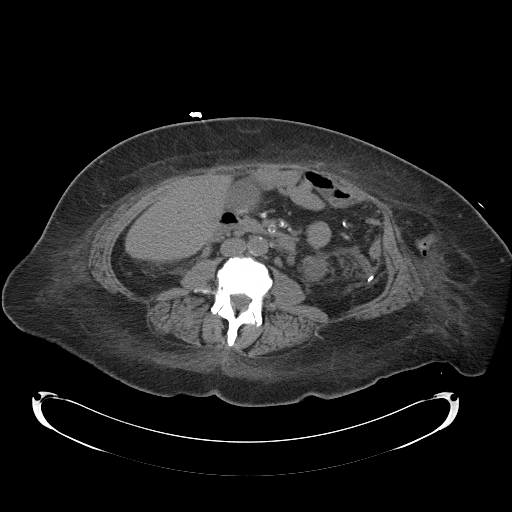
[im 52/87  soft-tissue]
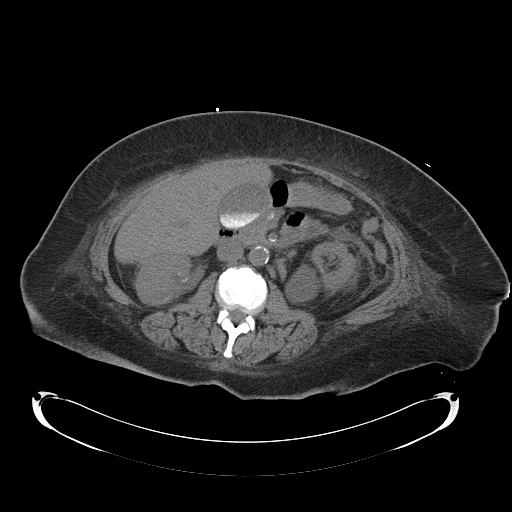
[im 52/87  bone]
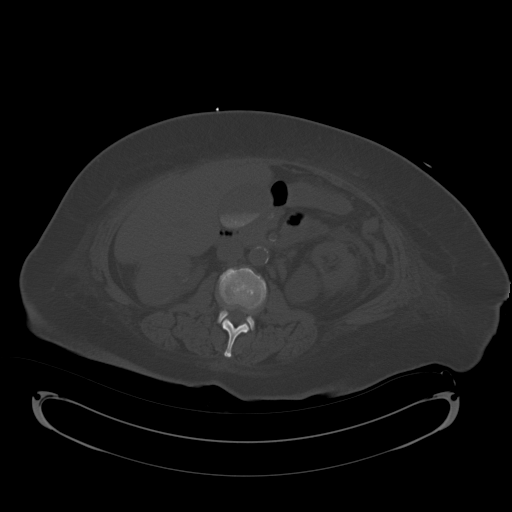
[im 59/87  soft-tissue]
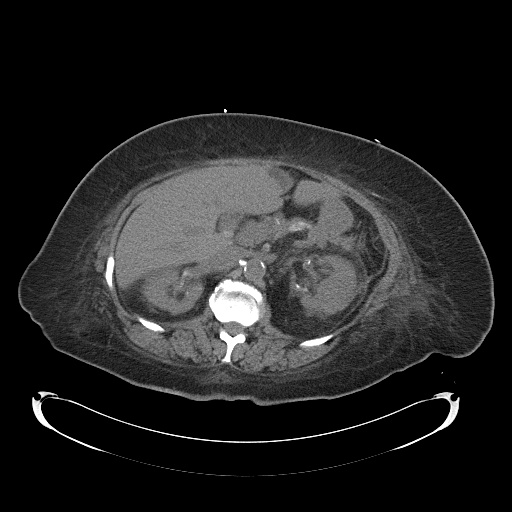
[im 66/87  soft-tissue]
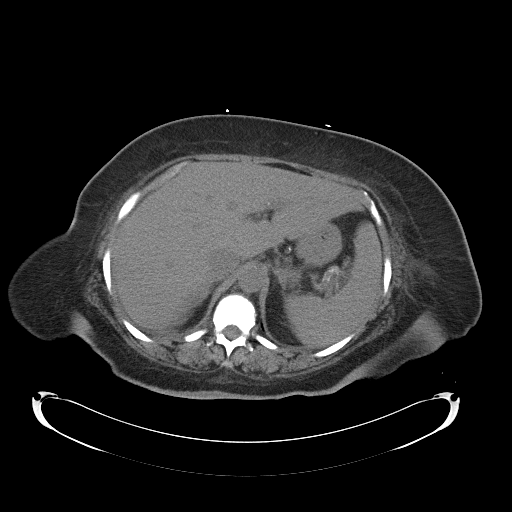
[im 69/87  soft-tissue]
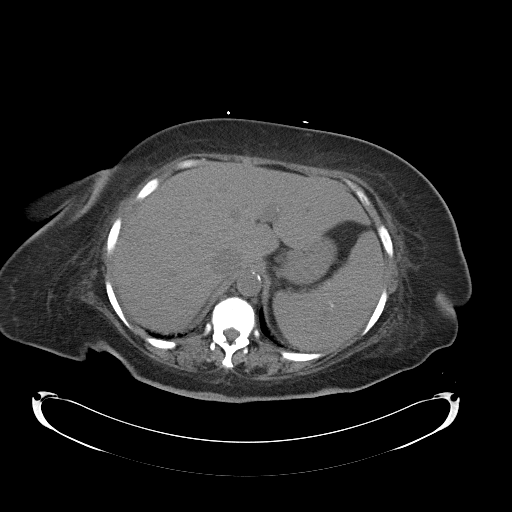
[im 76/87  soft-tissue]
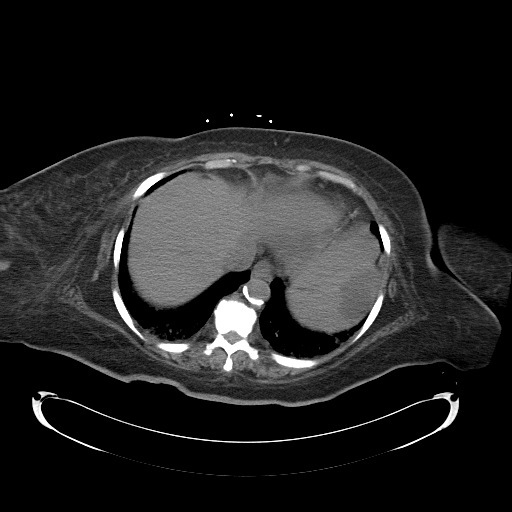
[im 83/87  soft-tissue]
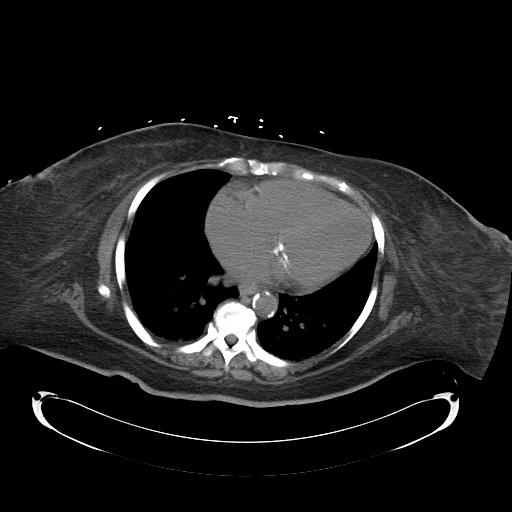

[Series 602: <mpr thick coronals · coronal · 0.93mm/px · 3 of 69 slices shown]
[im 23/69  soft-tissue]
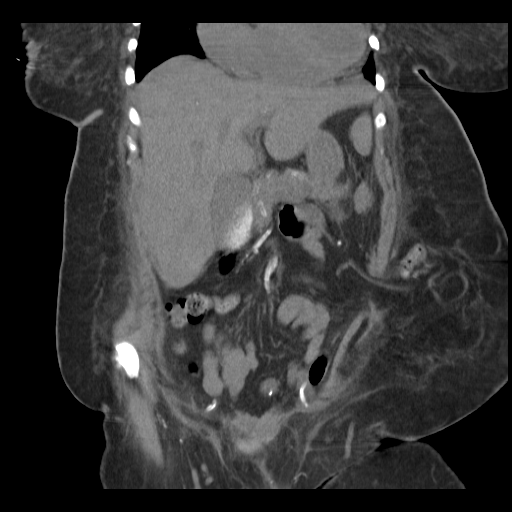
[im 31/69  soft-tissue]
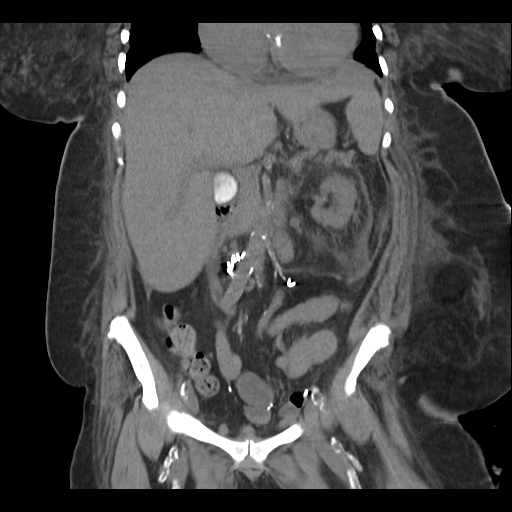
[im 38/69  soft-tissue]
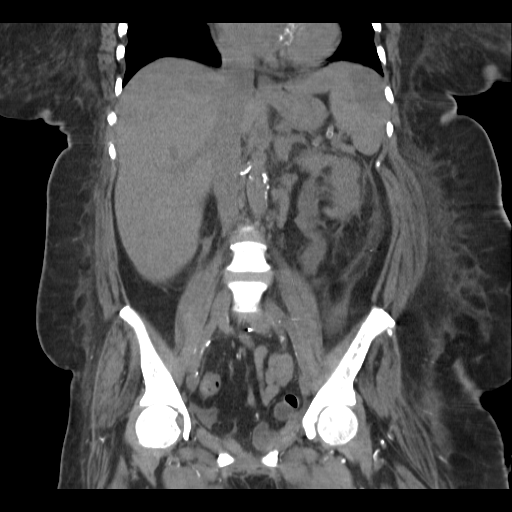

[17 of 46 positions shown; findings below may reference images not displayed]

FINDINGS: Atelectasis in the lung bases.

Layering of multiple stones in the gallbladder, similar previous
study.  Low attenuation lesion in the spleen is stable since the
previous study.  There is a large left anterior abdominal wall
hernia arising between the rectus abdominous and flank muscles.
The hernia contains colon, small bowel, and fat and appears to
represent a peristomal hernia.  This appears stable since the
previous study.  There is a midline abdominal hernia below the
level in the emboli this which appears represent a peristomal
hernia arising from an ileal conduit.  Infiltration or edema in the
subcutaneous fat.

The unenhanced liver is unremarkable.  Postoperative changes with
bladder resection and right lower quadrant ileal conduit.  The left
ureter is distended with.  Renal stranding on the left suggesting
obstruction.  No significant obstruction of the right kidney.
Descending colostomy in the left lower quadrant.  Calcification of
the abdominal aorta without aneurysm.

Pelvis:  No free or loculated pelvic fluid collections.  No
significant pelvic lymphadenopathy.  Degenerative changes in the
lumbar spine.
IMPRESSION: No other postoperative changes with resection of the bladder and
right lower quadrant ileal conduit.  Suggestion of left ureteral
obstruction.  Left lower quadrant colostomy.  A peristomal hernia
is around the ileal conduit and the colostomy.  No evidence of
bowel obstruction.  Cholelithiasis.  Stable splenic lesion.

## 2012-05-28 ENCOUNTER — Ambulatory Visit (INDEPENDENT_AMBULATORY_CARE_PROVIDER_SITE_OTHER)
Admission: RE | Admit: 2012-05-28 | Discharge: 2012-05-28 | Disposition: A | Discharge status: Home / Self Care | Payer: Medicare Other | Source: Ambulatory Visit | Attending: Internal Medicine | Admitting: Internal Medicine

## 2012-05-28 ENCOUNTER — Ambulatory Visit (INDEPENDENT_AMBULATORY_CARE_PROVIDER_SITE_OTHER): Payer: Medicare Other | Admitting: Internal Medicine

## 2012-05-28 ENCOUNTER — Encounter: Payer: Self-pay | Admitting: Internal Medicine

## 2012-05-28 VITALS — BP 120/68 | HR 72 | Temp 98.1°F | Resp 16 | Wt 201.0 lb

## 2012-05-28 DIAGNOSIS — J209 Acute bronchitis, unspecified: Secondary | ICD-10-CM

## 2012-05-28 DIAGNOSIS — J40 Bronchitis, not specified as acute or chronic: Secondary | ICD-10-CM | POA: Diagnosis not present

## 2012-05-28 DIAGNOSIS — E119 Type 2 diabetes mellitus without complications: Secondary | ICD-10-CM

## 2012-05-28 DIAGNOSIS — I498 Other specified cardiac arrhythmias: Secondary | ICD-10-CM

## 2012-05-28 DIAGNOSIS — I2510 Atherosclerotic heart disease of native coronary artery without angina pectoris: Secondary | ICD-10-CM | POA: Diagnosis not present

## 2012-05-28 DIAGNOSIS — I5042 Chronic combined systolic (congestive) and diastolic (congestive) heart failure: Secondary | ICD-10-CM | POA: Diagnosis not present

## 2012-05-28 DIAGNOSIS — Z992 Dependence on renal dialysis: Secondary | ICD-10-CM

## 2012-05-28 DIAGNOSIS — I10 Essential (primary) hypertension: Secondary | ICD-10-CM

## 2012-05-28 DIAGNOSIS — E785 Hyperlipidemia, unspecified: Secondary | ICD-10-CM

## 2012-05-28 DIAGNOSIS — N186 End stage renal disease: Secondary | ICD-10-CM | POA: Insufficient documentation

## 2012-05-28 MED ORDER — ISOSORBIDE MONONITRATE ER 60 MG PO TB24: 60.0000 mg | ORAL_TABLET | Freq: Every day | ORAL | Status: DC

## 2012-05-28 MED ORDER — AMIODARONE HCL 200 MG PO TABS: ORAL_TABLET | ORAL | Status: DC

## 2012-05-28 MED ORDER — FLUTICASONE-SALMETEROL 250-50 MCG/DOSE IN AEPB: 1.0000 | INHALATION_SPRAY | Freq: Three times a day (TID) | RESPIRATORY_TRACT | Status: DC

## 2012-05-28 NOTE — Assessment & Plan Note (Signed)
On insulin 

## 2012-05-28 NOTE — Assessment & Plan Note (Signed)
Continue with current prescription therapy as reflected on the Med list.  

## 2012-05-28 NOTE — Progress Notes (Signed)
Subjective:    HPI   The patient presents for a follow-up of  chronic hypertension, chronic dyslipidemia, type 2 diabetes controlled with medicines and ESRD on HD. She had her R toes amputated. Doing better.  C/o cough and allergies after she cut her grass 2 wks ago. C/o wheezing...   Review of Systems  Constitutional: Positive for fatigue. Negative for chills.  HENT: Positive for tinnitus. Negative for nosebleeds and dental problem.   Eyes: Negative for redness.  Respiratory: Negative for cough.   Gastrointestinal: Negative for blood in stool and anal bleeding.  Genitourinary: Negative for dysuria, urgency and flank pain.  Musculoskeletal: Positive for myalgias.  Neurological: Positive for weakness and numbness. Negative for dizziness.  Psychiatric/Behavioral: Negative for suicidal ideas. The patient is nervous/anxious.    Wt Readings from Last 3 Encounters:  05/28/12 201 lb (91.173 kg)  05/21/12 200 lb (90.719 kg)  02/28/12 195 lb (88.451 kg)   BP Readings from Last 3 Encounters:  05/28/12 120/68  05/21/12 122/68  02/28/12 140/78        Objective:   Physical Exam  Constitutional: She appears well-developed and well-nourished. No distress.  HENT:  Head: Normocephalic.  Right Ear: External ear normal.  Left Ear: External ear normal.  Nose: Nose normal.  Mouth/Throat: Oropharynx is clear and moist.  Eyes: Conjunctivae are normal. Pupils are equal, round, and reactive to light. Right eye exhibits no discharge. Left eye exhibits no discharge.  Neck: Normal range of motion. Neck supple. No JVD present. No tracheal deviation present. No thyromegaly present.  Cardiovascular: Normal rate, regular rhythm and normal heart sounds.   Pulmonary/Chest: No stridor. No respiratory distress. She has no wheezes.  Abdominal: Soft. Bowel sounds are normal. She exhibits no distension and no mass. There is no tenderness. There is no rebound and no guarding.  Colostomy Urostomy   Musculoskeletal: She exhibits no edema and no tenderness.  Lymphadenopathy:    She has no cervical adenopathy.  Neurological: She displays normal reflexes. No cranial nerve deficit. She exhibits normal muscle tone. Coordination normal.  Skin: No rash noted. No erythema.  Dry skin  Psychiatric: She has a normal mood and affect. Her behavior is normal. Judgment and thought content normal.   Cane  Lab Results  Component Value Date   WBC 6.4 02/28/2012   HGB 10.2* 02/28/2012   HCT 31.3* 02/28/2012   PLT 209 02/28/2012   GLUCOSE 91 02/28/2012   CHOL  Value: 141        ATP III CLASSIFICATION:  <200     mg/dL   Desirable  409-811  mg/dL   Borderline High  >=914    mg/dL   High        07/17/2954   TRIG 189* 01/10/2010   HDL 37* 01/10/2010   LDLCALC  Value: 66        Total Cholesterol/HDL:CHD Risk Coronary Heart Disease Risk Table                     Men   Women  1/2 Average Risk   3.4   3.3  Average Risk       5.0   4.4  2 X Average Risk   9.6   7.1  3 X Average Risk  23.4   11.0        Use the calculated Patient Ratio above and the CHD Risk Table to determine the patient's CHD Risk.        ATP III CLASSIFICATION (  LDL):  <100     mg/dL   Optimal  324-401  mg/dL   Near or Above                    Optimal  130-159  mg/dL   Borderline  027-253  mg/dL   High  >664     mg/dL   Very High 4/0/3474   ALT 8 05/21/2012   AST 13 05/21/2012   NA 137 02/28/2012   K 4.9 02/28/2012   CL 97 02/28/2012   CREATININE 6.86* 02/28/2012   BUN 49* 02/28/2012   CO2 23 02/28/2012   TSH 0.55 05/21/2012   INR 0.99 05/16/2010   HGBA1C 7.5* 10/10/2011       I personally provided Advair inhaler use teaching. After the teaching patient was able to demonstrate it's use effectively. All questions were answered    Assessment & Plan:     .

## 2012-05-28 NOTE — Assessment & Plan Note (Signed)
CXR Advair bid

## 2012-05-28 NOTE — Assessment & Plan Note (Signed)
Better on HD  

## 2012-05-28 NOTE — Assessment & Plan Note (Signed)
On HD 

## 2012-06-08 DIAGNOSIS — N186 End stage renal disease: Secondary | ICD-10-CM | POA: Diagnosis not present

## 2012-06-10 DIAGNOSIS — D631 Anemia in chronic kidney disease: Secondary | ICD-10-CM | POA: Diagnosis not present

## 2012-06-10 DIAGNOSIS — Z992 Dependence on renal dialysis: Secondary | ICD-10-CM | POA: Diagnosis not present

## 2012-06-10 DIAGNOSIS — D509 Iron deficiency anemia, unspecified: Secondary | ICD-10-CM | POA: Diagnosis not present

## 2012-06-10 DIAGNOSIS — N2581 Secondary hyperparathyroidism of renal origin: Secondary | ICD-10-CM | POA: Diagnosis not present

## 2012-06-10 DIAGNOSIS — N186 End stage renal disease: Secondary | ICD-10-CM | POA: Diagnosis not present

## 2012-06-18 ENCOUNTER — Encounter: Payer: Self-pay | Admitting: Endocrinology

## 2012-06-18 ENCOUNTER — Ambulatory Visit (INDEPENDENT_AMBULATORY_CARE_PROVIDER_SITE_OTHER): Payer: Medicare Other | Admitting: Endocrinology

## 2012-06-18 VITALS — BP 134/78 | HR 90 | Ht 59.0 in | Wt 199.0 lb

## 2012-06-18 DIAGNOSIS — E1029 Type 1 diabetes mellitus with other diabetic kidney complication: Secondary | ICD-10-CM

## 2012-06-18 DIAGNOSIS — E119 Type 2 diabetes mellitus without complications: Secondary | ICD-10-CM | POA: Diagnosis not present

## 2012-06-18 MED ORDER — INSULIN LISPRO PROT & LISPRO (50-50 MIX) 100 UNIT/ML ~~LOC~~ SUSP: 20.0000 [IU] | Freq: Every day | SUBCUTANEOUS | Status: DC

## 2012-06-18 NOTE — Progress Notes (Signed)
Subjective:    Patient ID: [name], female    DOB: Aug 04, 1945, 67 y.o.   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  pt states she feels well in general.  no cbg record, but states cbg's vary from 112-186.  It is in general higher as the day goes on. She is taking the insulin in the evening. Past Medical History  Diagnosis Date  . Hyperlipidemia   . Mitral valve insufficiency and aortic valve insufficiency   . Cardiomyopathy- mixed   . 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 Personal history of malignant neoplasm of large intestine   . Chronic diastolic heart failure   . Anemia   . Coronary artery disease     Previously decreased EF; echo 113 normal LV function  . Hernia, incisional     abd  . Cancer     history of colon cancer.  1986  . Arthritis   . LBBB (left bundle branch block)     sees Dr. Graciela Husbands  . Atrial tachycardia     on amiod  . Unspecified essential hypertension     sees Dr. Posey Rea  . Stroke     2011/12  . Renal failure     ESRD, Dr Dunham/Dr. Layla Barter.  M, W, Fr    Past Surgical History  Procedure Laterality Date  . Colostomy    . Revision urostomy cutaneous    . Esophagogastroduodenoscopy  03-18-04  . Electrocardiogram  04-27-06  . Placement of new left forearm arteriovenous graft  03-11-08  . Left heart catheterization and right heart catheterization  12-10    R. heart cath showed elevated left and right heart filling pressures w/ pulmonary artery pressure elevated mildly out of proportion to the wedge. The left heart cath showed diffuse distal vessel disease as well as a 75% stenosis in the mid circumflex w/ a 90% stenosis of the ostial first obtuse marginal. These lesions were in close proximity. there was a 60-70%mild RCA stenosis.   . Arteriovenous  graft placement  2010  . 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 Colon surgery    . Cardiac catheterization    . Eye surgery      Cataract Left  . Pars plana vitrectomy  11/29/2011    Procedure: PARS PLANA VITRECTOMY WITH 23 GAUGE;  Surgeon: Shade Flood, MD;  Location: [facility] OR;  Service: Ophthalmology;  Laterality: Right;  Right Eye 23 ga vitrectomy with membrane peel  . Pars plana vitrectomy Left 02/28/2012    Procedure: PARS PLANA VITRECTOMY WITH 23 GAUGE;  Surgeon: Shade Flood, MD;  Location: [facility] OR;  Service: Ophthalmology;  Laterality: Left;    History   Social History  . Marital Status: Single    Spouse Name: N/A    Number of Children: N/A  . Years of Education: N/A   Occupational History  . Not on file.   Social History Main Topics  . Smoking status: Never Smoker   . Smokeless tobacco: Never Used  . Alcohol Use: No  . Drug Use: No  . Sexually Active: No   Other Topics Concern  . Not on file   Social History Narrative  . No narrative on file    Current Outpatient Prescriptions on File Prior to  Visit  Medication Sig Dispense Refill  . amiodarone (PACERONE) 200 MG tablet Take 1/2 tablet by mouth once daily.  45 tablet  3  . aspirin 81 MG chewable tablet Chew 81 mg by mouth daily.        . calcium acetate (PHOSLO) 667 MG capsule Take 2,001 mg by mouth 3 (three) times daily with meals. Take 2 capsules in the middle of each meal each day      . Fluticasone-Salmeterol (ADVAIR DISKUS) 250-50 MCG/DOSE AEPB Inhale 1 puff into the lungs 3 (three) times daily.  60 each  3  . isosorbide mononitrate (IMDUR) 60 MG 24 hr tablet Take 1 tablet (60 mg total) by mouth daily.  90 tablet  3  . metoprolol (LOPRESSOR) 50 MG tablet Take 50 mg by mouth daily. Daily except on monday, Wednesday, and friday      . simvastatin (ZOCOR) 10 MG tablet Take 1 tablet (10 mg total) by mouth daily.  90 tablet  3   No current facility-administered medications on  file prior to visit.    Allergies  Allergen Reactions  . Ace Inhibitors Cough  . Eggs Or Egg-Derived Products Nausea And Vomiting  . Enalapril Cough  . Lisinopril Cough  . Omnipaque (Iohexol) Hives    Family History  Problem Relation Age of Onset  . Cancer Sister     colon  . Hypertension Other   . Hypertension Mother   . Coronary artery disease Mother   . Hypertension Father   . Diabetes Father     BP 134/78  Pulse 90  Ht 4\' 11"  (1.499 m)  Wt 199 lb (90.266 kg)  BMI 40.17 kg/m2  SpO2 92%  Review of Systems denies hypoglycemia and weight change.      Objective:   Physical Exam VITAL SIGNS:  See vs page. GENERAL: no distress.  Lab Results  Component Value Date   HGBA1C 6.9* 06/18/2012      Assessment & Plan:  DM: This insulin regimen was chosen from multiple options, for its simplicity.  Based on the pattern of her cbg's, she is better off taking her insulin in the morning.  The benefits of glycemic control must be weighed against the risks of hypoglycemia.

## 2012-06-18 NOTE — Patient Instructions (Addendum)
A diabetes blood test is requested for you today.  You will be contacted with results. Continue humalog 50/50, 20 units each morning.   Please come back for a follow-up appointment in 3 months. check your blood sugar 2 times a day.  vary the time of day when you check, between before the 3 meals, and at bedtime.  also check if you have symptoms of your blood sugar being too high or too low.  please keep a record of the readings and bring it to your next appointment here.  please call us sooner if you are having low blood sugar episodes, or if it stays over 200.

## 2012-07-08 DIAGNOSIS — N186 End stage renal disease: Secondary | ICD-10-CM | POA: Diagnosis not present

## 2012-07-10 DIAGNOSIS — N2581 Secondary hyperparathyroidism of renal origin: Secondary | ICD-10-CM | POA: Diagnosis not present

## 2012-07-10 DIAGNOSIS — N186 End stage renal disease: Secondary | ICD-10-CM | POA: Diagnosis not present

## 2012-07-10 DIAGNOSIS — Z992 Dependence on renal dialysis: Secondary | ICD-10-CM | POA: Diagnosis not present

## 2012-07-10 DIAGNOSIS — D631 Anemia in chronic kidney disease: Secondary | ICD-10-CM | POA: Diagnosis not present

## 2012-07-10 DIAGNOSIS — D509 Iron deficiency anemia, unspecified: Secondary | ICD-10-CM | POA: Diagnosis not present

## 2012-07-16 DIAGNOSIS — E11359 Type 2 diabetes mellitus with proliferative diabetic retinopathy without macular edema: Secondary | ICD-10-CM | POA: Diagnosis not present

## 2012-07-16 DIAGNOSIS — H1030 Unspecified acute conjunctivitis, unspecified eye: Secondary | ICD-10-CM | POA: Diagnosis not present

## 2012-07-16 DIAGNOSIS — E119 Type 2 diabetes mellitus without complications: Secondary | ICD-10-CM | POA: Diagnosis not present

## 2012-07-31 DIAGNOSIS — E1129 Type 2 diabetes mellitus with other diabetic kidney complication: Secondary | ICD-10-CM | POA: Diagnosis not present

## 2012-08-08 DIAGNOSIS — N186 End stage renal disease: Secondary | ICD-10-CM | POA: Diagnosis not present

## 2012-08-09 DIAGNOSIS — D509 Iron deficiency anemia, unspecified: Secondary | ICD-10-CM | POA: Diagnosis not present

## 2012-08-09 DIAGNOSIS — D631 Anemia in chronic kidney disease: Secondary | ICD-10-CM | POA: Diagnosis not present

## 2012-08-09 DIAGNOSIS — Z992 Dependence on renal dialysis: Secondary | ICD-10-CM | POA: Diagnosis not present

## 2012-08-09 DIAGNOSIS — N390 Urinary tract infection, site not specified: Secondary | ICD-10-CM | POA: Diagnosis not present

## 2012-08-09 DIAGNOSIS — E1129 Type 2 diabetes mellitus with other diabetic kidney complication: Secondary | ICD-10-CM | POA: Diagnosis not present

## 2012-08-09 DIAGNOSIS — N186 End stage renal disease: Secondary | ICD-10-CM | POA: Diagnosis not present

## 2012-08-09 DIAGNOSIS — N2581 Secondary hyperparathyroidism of renal origin: Secondary | ICD-10-CM | POA: Diagnosis not present

## 2012-08-16 DIAGNOSIS — E1059 Type 1 diabetes mellitus with other circulatory complications: Secondary | ICD-10-CM | POA: Diagnosis not present

## 2012-08-16 DIAGNOSIS — I739 Peripheral vascular disease, unspecified: Secondary | ICD-10-CM | POA: Diagnosis not present

## 2012-08-16 DIAGNOSIS — L608 Other nail disorders: Secondary | ICD-10-CM | POA: Diagnosis not present

## 2012-08-21 DIAGNOSIS — N390 Urinary tract infection, site not specified: Secondary | ICD-10-CM | POA: Diagnosis not present

## 2012-08-29 DIAGNOSIS — E11359 Type 2 diabetes mellitus with proliferative diabetic retinopathy without macular edema: Secondary | ICD-10-CM | POA: Diagnosis not present

## 2012-08-29 DIAGNOSIS — E119 Type 2 diabetes mellitus without complications: Secondary | ICD-10-CM | POA: Diagnosis not present

## 2012-09-03 ENCOUNTER — Ambulatory Visit: Payer: Medicare Other | Admitting: Internal Medicine

## 2012-09-03 DIAGNOSIS — Z0289 Encounter for other administrative examinations: Secondary | ICD-10-CM

## 2012-09-08 DIAGNOSIS — N186 End stage renal disease: Secondary | ICD-10-CM | POA: Diagnosis not present

## 2012-09-09 DIAGNOSIS — D631 Anemia in chronic kidney disease: Secondary | ICD-10-CM | POA: Diagnosis not present

## 2012-09-09 DIAGNOSIS — D509 Iron deficiency anemia, unspecified: Secondary | ICD-10-CM | POA: Diagnosis not present

## 2012-09-09 DIAGNOSIS — N2581 Secondary hyperparathyroidism of renal origin: Secondary | ICD-10-CM | POA: Diagnosis not present

## 2012-09-09 DIAGNOSIS — Z992 Dependence on renal dialysis: Secondary | ICD-10-CM | POA: Diagnosis not present

## 2012-09-09 DIAGNOSIS — N186 End stage renal disease: Secondary | ICD-10-CM | POA: Diagnosis not present

## 2012-09-09 DIAGNOSIS — Z23 Encounter for immunization: Secondary | ICD-10-CM | POA: Diagnosis not present

## 2012-10-08 DIAGNOSIS — N186 End stage renal disease: Secondary | ICD-10-CM | POA: Diagnosis not present

## 2012-10-09 DIAGNOSIS — Z23 Encounter for immunization: Secondary | ICD-10-CM | POA: Diagnosis not present

## 2012-10-09 DIAGNOSIS — Z992 Dependence on renal dialysis: Secondary | ICD-10-CM | POA: Diagnosis not present

## 2012-10-09 DIAGNOSIS — N2581 Secondary hyperparathyroidism of renal origin: Secondary | ICD-10-CM | POA: Diagnosis not present

## 2012-10-09 DIAGNOSIS — N186 End stage renal disease: Secondary | ICD-10-CM | POA: Diagnosis not present

## 2012-10-09 DIAGNOSIS — D509 Iron deficiency anemia, unspecified: Secondary | ICD-10-CM | POA: Diagnosis not present

## 2012-10-30 DIAGNOSIS — E1129 Type 2 diabetes mellitus with other diabetic kidney complication: Secondary | ICD-10-CM | POA: Diagnosis not present

## 2012-11-05 DIAGNOSIS — E119 Type 2 diabetes mellitus without complications: Secondary | ICD-10-CM | POA: Diagnosis not present

## 2012-11-05 DIAGNOSIS — E11359 Type 2 diabetes mellitus with proliferative diabetic retinopathy without macular edema: Secondary | ICD-10-CM | POA: Diagnosis not present

## 2012-11-08 DIAGNOSIS — N186 End stage renal disease: Secondary | ICD-10-CM | POA: Diagnosis not present

## 2012-11-11 DIAGNOSIS — Z992 Dependence on renal dialysis: Secondary | ICD-10-CM | POA: Diagnosis not present

## 2012-11-11 DIAGNOSIS — D509 Iron deficiency anemia, unspecified: Secondary | ICD-10-CM | POA: Diagnosis not present

## 2012-11-11 DIAGNOSIS — D631 Anemia in chronic kidney disease: Secondary | ICD-10-CM | POA: Diagnosis not present

## 2012-11-11 DIAGNOSIS — N186 End stage renal disease: Secondary | ICD-10-CM | POA: Diagnosis not present

## 2012-11-11 DIAGNOSIS — N2581 Secondary hyperparathyroidism of renal origin: Secondary | ICD-10-CM | POA: Diagnosis not present

## 2012-11-25 ENCOUNTER — Other Ambulatory Visit: Payer: Self-pay | Admitting: *Deleted

## 2012-12-08 DIAGNOSIS — N186 End stage renal disease: Secondary | ICD-10-CM | POA: Diagnosis not present

## 2012-12-09 DIAGNOSIS — D631 Anemia in chronic kidney disease: Secondary | ICD-10-CM | POA: Diagnosis not present

## 2012-12-09 DIAGNOSIS — Z992 Dependence on renal dialysis: Secondary | ICD-10-CM | POA: Diagnosis not present

## 2012-12-09 DIAGNOSIS — D509 Iron deficiency anemia, unspecified: Secondary | ICD-10-CM | POA: Diagnosis not present

## 2012-12-09 DIAGNOSIS — N2581 Secondary hyperparathyroidism of renal origin: Secondary | ICD-10-CM | POA: Diagnosis not present

## 2012-12-09 DIAGNOSIS — N186 End stage renal disease: Secondary | ICD-10-CM | POA: Diagnosis not present

## 2012-12-10 DIAGNOSIS — H4310 Vitreous hemorrhage, unspecified eye: Secondary | ICD-10-CM | POA: Diagnosis not present

## 2012-12-10 DIAGNOSIS — E119 Type 2 diabetes mellitus without complications: Secondary | ICD-10-CM | POA: Diagnosis not present

## 2012-12-10 DIAGNOSIS — E11359 Type 2 diabetes mellitus with proliferative diabetic retinopathy without macular edema: Secondary | ICD-10-CM | POA: Diagnosis not present

## 2012-12-11 DIAGNOSIS — H4310 Vitreous hemorrhage, unspecified eye: Secondary | ICD-10-CM | POA: Diagnosis not present

## 2012-12-11 DIAGNOSIS — H4089 Other specified glaucoma: Secondary | ICD-10-CM | POA: Diagnosis not present

## 2013-01-08 DIAGNOSIS — N186 End stage renal disease: Secondary | ICD-10-CM | POA: Diagnosis not present

## 2013-01-10 DIAGNOSIS — N2581 Secondary hyperparathyroidism of renal origin: Secondary | ICD-10-CM | POA: Diagnosis not present

## 2013-01-10 DIAGNOSIS — D631 Anemia in chronic kidney disease: Secondary | ICD-10-CM | POA: Diagnosis not present

## 2013-01-10 DIAGNOSIS — Z992 Dependence on renal dialysis: Secondary | ICD-10-CM | POA: Diagnosis not present

## 2013-01-10 DIAGNOSIS — D509 Iron deficiency anemia, unspecified: Secondary | ICD-10-CM | POA: Diagnosis not present

## 2013-01-10 DIAGNOSIS — A4902 Methicillin resistant Staphylococcus aureus infection, unspecified site: Secondary | ICD-10-CM | POA: Diagnosis not present

## 2013-01-10 DIAGNOSIS — N186 End stage renal disease: Secondary | ICD-10-CM | POA: Diagnosis not present

## 2013-01-10 DIAGNOSIS — N390 Urinary tract infection, site not specified: Secondary | ICD-10-CM | POA: Diagnosis not present

## 2013-01-24 DIAGNOSIS — N390 Urinary tract infection, site not specified: Secondary | ICD-10-CM | POA: Diagnosis not present

## 2013-01-30 ENCOUNTER — Encounter: Payer: Self-pay | Admitting: Internal Medicine

## 2013-01-30 ENCOUNTER — Ambulatory Visit (INDEPENDENT_AMBULATORY_CARE_PROVIDER_SITE_OTHER): Payer: Medicare Other | Admitting: Internal Medicine

## 2013-01-30 ENCOUNTER — Other Ambulatory Visit: Payer: Self-pay | Admitting: *Deleted

## 2013-01-30 VITALS — BP 110/52 | HR 76 | Temp 98.3°F | Resp 16 | Wt 200.0 lb

## 2013-01-30 VITALS — BP 142/75 | HR 97 | Ht 60.0 in | Wt 198.0 lb

## 2013-01-30 DIAGNOSIS — I498 Other specified cardiac arrhythmias: Secondary | ICD-10-CM

## 2013-01-30 DIAGNOSIS — I2510 Atherosclerotic heart disease of native coronary artery without angina pectoris: Secondary | ICD-10-CM

## 2013-01-30 DIAGNOSIS — K921 Melena: Secondary | ICD-10-CM | POA: Diagnosis not present

## 2013-01-30 DIAGNOSIS — Z992 Dependence on renal dialysis: Secondary | ICD-10-CM

## 2013-01-30 DIAGNOSIS — D649 Anemia, unspecified: Secondary | ICD-10-CM | POA: Diagnosis not present

## 2013-01-30 DIAGNOSIS — I471 Supraventricular tachycardia: Secondary | ICD-10-CM

## 2013-01-30 DIAGNOSIS — N186 End stage renal disease: Secondary | ICD-10-CM | POA: Diagnosis not present

## 2013-01-30 DIAGNOSIS — I10 Essential (primary) hypertension: Secondary | ICD-10-CM

## 2013-01-30 MED ORDER — AMIODARONE HCL 200 MG PO TABS: ORAL_TABLET | ORAL | Status: DC

## 2013-01-30 MED ORDER — SIMVASTATIN 10 MG PO TABS: 10.0000 mg | ORAL_TABLET | Freq: Every day | ORAL | Status: DC

## 2013-01-30 MED ORDER — ISOSORBIDE MONONITRATE ER 60 MG PO TB24: 60.0000 mg | ORAL_TABLET | Freq: Every day | ORAL | Status: DC

## 2013-01-30 NOTE — Assessment & Plan Note (Signed)
On HD since 2011

## 2013-01-30 NOTE — Assessment & Plan Note (Addendum)
1/15 colostomy bag since 1986 F/u w/Dr Benson Setting CBC, CEA

## 2013-01-30 NOTE — Assessment & Plan Note (Signed)
Reasonably controlled on her amiodarone. She has no symptoms. We'll arrange for amiodarone surveillance laboratories from dialysis.

## 2013-01-30 NOTE — Progress Notes (Signed)
Subjective:    HPI  C/o blood in stool (colostomy bag x 1-2 times) on Tuesday, no pain  The patient presents for a follow-up of  chronic hypertension, chronic dyslipidemia, type 2 diabetes controlled with medicines and ESRD on HD. She had her R toes amputated.    Review of Systems  Constitutional: Positive for fatigue. Negative for chills.  HENT: Positive for tinnitus. Negative for dental problem and nosebleeds.   Eyes: Negative for redness.  Respiratory: Negative for cough.   Gastrointestinal: Negative for blood in stool and anal bleeding.  Genitourinary: Negative for dysuria, urgency and flank pain.  Musculoskeletal: Positive for myalgias.  Neurological: Positive for weakness and numbness. Negative for dizziness.  Psychiatric/Behavioral: Negative for suicidal ideas. The patient is nervous/anxious.    Wt Readings from Last 3 Encounters:  01/30/13 200 lb (90.719 kg)  06/18/12 199 lb (90.266 kg)  05/28/12 201 lb (91.173 kg)   BP Readings from Last 3 Encounters:  01/30/13 110/52  06/18/12 134/78  05/28/12 120/68        Objective:   Physical Exam  Constitutional: She appears well-developed and well-nourished. No distress.  HENT:  Head: Normocephalic.  Right Ear: External ear normal.  Left Ear: External ear normal.  Nose: Nose normal.  Mouth/Throat: Oropharynx is clear and moist.  Eyes: Conjunctivae are normal. Pupils are equal, round, and reactive to light. Right eye exhibits no discharge. Left eye exhibits no discharge.  Neck: Normal range of motion. Neck supple. No JVD present. No tracheal deviation present. No thyromegaly present.  Cardiovascular: Normal rate, regular rhythm and normal heart sounds.   Pulmonary/Chest: No stridor. No respiratory distress. She has no wheezes.  Abdominal: Soft. Bowel sounds are normal. She exhibits no distension and no mass. There is no tenderness. There is no rebound and no guarding.  Colostomy Urostomy  Musculoskeletal: She  exhibits no edema and no tenderness.  Lymphadenopathy:    She has no cervical adenopathy.  Neurological: She displays normal reflexes. No cranial nerve deficit. She exhibits normal muscle tone. Coordination normal.  Skin: No rash noted. No erythema.  Dry skin  Psychiatric: She has a normal mood and affect. Her behavior is normal. Judgment and thought content normal.  Colostomy - WNL, brown stool Cane  Lab Results  Component Value Date   WBC 6.4 02/28/2012   HGB 10.2* 02/28/2012   HCT 31.3* 02/28/2012   PLT 209 02/28/2012   GLUCOSE 91 02/28/2012   CHOL  Value: 141        ATP III CLASSIFICATION:  <200     mg/dL   Desirable  200-239  mg/dL   Borderline High  >=240    mg/dL   High        01/10/2010   TRIG 189* 01/10/2010   HDL 37* 01/10/2010   LDLCALC  Value: 66        Total Cholesterol/HDL:CHD Risk Coronary Heart Disease Risk Table                     Men   Women  1/2 Average Risk   3.4   3.3  Average Risk       5.0   4.4  2 X Average Risk   9.6   7.1  3 X Average Risk  23.4   11.0        Use the calculated Patient Ratio above and the CHD Risk Table to determine the patient's CHD Risk.        ATP  III CLASSIFICATION (LDL):  <100     mg/dL   Optimal  100-129  mg/dL   Near or Above                    Optimal  130-159  mg/dL   Borderline  160-189  mg/dL   High  >190     mg/dL   Very High 01/10/2010   ALT 8 05/21/2012   AST 13 05/21/2012   NA 137 02/28/2012   K 4.9 02/28/2012   CL 97 02/28/2012   CREATININE 6.86* 02/28/2012   BUN 49* 02/28/2012   CO2 23 02/28/2012   TSH 0.55 05/21/2012   INR 0.99 05/16/2010   HGBA1C 6.9* 06/18/2012          Assessment & Plan:       .

## 2013-01-30 NOTE — Assessment & Plan Note (Signed)
Continue with current prescription therapy as reflected on the Med list. BP Readings from Last 3 Encounters:  01/30/13 110/52  06/18/12 134/78  05/28/12 120/68

## 2013-01-30 NOTE — Progress Notes (Signed)
Patient Care Team: Cassandria Anger, MD as PCP - General Louis Meckel, MD (Nephrology) Deboraha Sprang, MD (Cardiology) Renato Shin, MD as Attending Physician (Internal Medicine) Gatha Mayer, MD as Consulting Physician (Gastroenterology)   HPI  [name] is a 68 y.o. female seen in follow up for an atrial tachycardia that was responsive to amiodarone.  She is notable notably had a cough at her last visit. Her cough has resolved   She has end-stage renal disease and is on dialysis.    Remotely she had an echo that demonstrated severe left ventricular dysfunction at about 25%. She developed recurrent tachycardia in December 2010 with a non-STEMI. Catheterization demonstrated 60% mid RCA; subtotal occlusion of the posterior descending 70 AV circ disease and a 30% proximal LAD. She also had distal disease in her diagonal and her LAD.   Ejection fraction however had improved to 45% and subsequent echoes over the last year have shown ejection fraction 55-65% on all occasions. She has chronic left bundle branch block   She has no significant dyspnea. She has had no edema. She has run out of her medications.    Past Medical History  Diagnosis Date  . Hyperlipidemia   . Mitral valve insufficiency and aortic valve insufficiency   . Cardiomyopathy- mixed   . 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 Personal history of malignant neoplasm of large intestine   . Chronic diastolic heart failure   . Anemia   . Coronary artery disease     Previously decreased EF; echo 113 normal LV function  . Hernia, incisional     abd  . Cancer     history of colon cancer.  1986  . Arthritis   . LBBB (left bundle branch block)     sees Dr. Caryl Comes  . Atrial tachycardia     on amiod  . Unspecified essential hypertension     sees Dr. Alain Marion  . Stroke     2011/12  . Renal failure     ESRD, Dr Dunham/Dr. Lyda Kalata.  M, W,  Fr    Past Surgical History  Procedure Laterality Date  . Colostomy    . Revision urostomy cutaneous    . Esophagogastroduodenoscopy  03-18-04  . Electrocardiogram  04-27-06  . Placement of new left forearm arteriovenous graft  03-11-08  . Left heart catheterization and right heart catheterization  12-10    R. heart cath showed elevated left and right heart filling pressures w/ pulmonary artery pressure elevated mildly out of proportion to the wedge. The left heart cath showed diffuse distal vessel disease as well as a 75% stenosis in the mid circumflex w/ a 90% stenosis of the ostial first obtuse marginal. These lesions were in close proximity. there was a 60-70%mild RCA stenosis.   . Arteriovenous graft placement  2010  . 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 Colon surgery    . Cardiac catheterization    . Eye surgery      Cataract Left  . Pars plana vitrectomy  11/29/2011    Procedure: PARS PLANA VITRECTOMY WITH 23 GAUGE;  Surgeon: Adonis Brook, MD;  Location: [facility];  Service: Ophthalmology;  Laterality: Right;  Right Eye 23 ga vitrectomy with membrane peel  . Pars plana vitrectomy Left 02/28/2012    Procedure: PARS PLANA VITRECTOMY WITH 23 GAUGE;  Surgeon: Adonis Brook,  MD;  Location: [facility];  Service: Ophthalmology;  Laterality: Left;    Current Outpatient Prescriptions  Medication Sig Dispense Refill  . amiodarone (PACERONE) 200 MG tablet Take 1/2 tablet by mouth once daily.  45 tablet  3  . aspirin 81 MG chewable tablet Chew 81 mg by mouth daily.        . calcium acetate (PHOSLO) 667 MG capsule Take 2,001 mg by mouth 3 (three) times daily with meals. Take 2 capsules in the middle of each meal each day      . dorzolamide-timolol (COSOPT) 22.3-6.8 MG/ML ophthalmic solution As directed      . Fluticasone-Salmeterol (ADVAIR) 250-50 MCG/DOSE AEPB Inhale 1 puff into the lungs as needed.      . insulin lispro protamine-lispro (HUMALOG 50/50) (50-50)  100 UNIT/ML SUSP Inject 20 Units into the skin daily with breakfast.  10 mL  11  . isosorbide mononitrate (IMDUR) 60 MG 24 hr tablet Take 1 tablet (60 mg total) by mouth daily.  90 tablet  3  . metoprolol (LOPRESSOR) 50 MG tablet Take 50 mg by mouth daily. Daily except on monday, Wednesday, and friday      . simvastatin (ZOCOR) 10 MG tablet Take 1 tablet (10 mg total) by mouth daily.  90 tablet  3   No current facility-administered medications for this visit.    Allergies  Allergen Reactions  . Ace Inhibitors Cough  . Eggs Or Egg-Derived Products Nausea And Vomiting  . Enalapril Cough  . Lisinopril Cough  . Omnipaque [Iohexol] Hives    Review of Systems negative except from HPI and PMH  Physical Exam BP 142/75  Pulse 97  Ht 5' (1.524 m)  Wt 198 lb (89.812 kg)  BMI 38.67 kg/m2 Well developed and well nourished in no acute distress HENT normal E scleral and icterus clear Neck Supple  Clear to ausculation  Regular rate and rhythm, S4 and 2/6 systolic murmur Soft with active bowel sounds No clubbing cyanosis none Edema Alert and oriented, grossly normal motor and sensory function Skin Warm and Dry  ECG demonstrates sinus rhythm at 97 Intervals 19/14/40 Access left minus Left bundle branch block  Assessment and  Plan

## 2013-01-30 NOTE — Assessment & Plan Note (Signed)
Continue with current prescription therapy as reflected on the Med list.  

## 2013-01-30 NOTE — Progress Notes (Signed)
Pre visit review using our clinic review tool, if applicable. No additional management support is needed unless otherwise documented below in the visit note. 

## 2013-01-30 NOTE — Assessment & Plan Note (Signed)
Resolved. This is important as relates to amiodarone toxicity issues

## 2013-01-30 NOTE — Patient Instructions (Signed)
Your physician recommends that you schedule a follow-up appointment in: 6 months with Dr. Caryl Comes. We will send you a reminder letter a month in advance to make your appointment.  Your physician recommends that you continue on your current medications as directed. Please refer to the Current Medication list given to you today.  Your physician recommends that you have lab work at dialysis.

## 2013-01-30 NOTE — Assessment & Plan Note (Signed)
Labs at [place]

## 2013-01-31 ENCOUNTER — Inpatient Hospital Stay (HOSPITAL_COMMUNITY)
Admission: EM | Admit: 2013-01-31 | Discharge: 2013-02-04 | DRG: 280 | Disposition: A | Discharge status: Home / Self Care | Payer: Medicare Other | Attending: Internal Medicine | Admitting: Internal Medicine

## 2013-01-31 ENCOUNTER — Encounter (HOSPITAL_COMMUNITY): Payer: Self-pay | Admitting: Cardiology

## 2013-01-31 ENCOUNTER — Emergency Department (HOSPITAL_COMMUNITY): Payer: Medicare Other

## 2013-01-31 DIAGNOSIS — E785 Hyperlipidemia, unspecified: Secondary | ICD-10-CM | POA: Diagnosis present

## 2013-01-31 DIAGNOSIS — I428 Other cardiomyopathies: Secondary | ICD-10-CM | POA: Diagnosis present

## 2013-01-31 DIAGNOSIS — I080 Rheumatic disorders of both mitral and aortic valves: Secondary | ICD-10-CM | POA: Diagnosis present

## 2013-01-31 DIAGNOSIS — Z992 Dependence on renal dialysis: Secondary | ICD-10-CM

## 2013-01-31 DIAGNOSIS — D649 Anemia, unspecified: Secondary | ICD-10-CM | POA: Diagnosis not present

## 2013-01-31 DIAGNOSIS — Z6837 Body mass index (BMI) 37.0-37.9, adult: Secondary | ICD-10-CM

## 2013-01-31 DIAGNOSIS — I509 Heart failure, unspecified: Secondary | ICD-10-CM | POA: Diagnosis present

## 2013-01-31 DIAGNOSIS — Z8673 Personal history of transient ischemic attack (TIA), and cerebral infarction without residual deficits: Secondary | ICD-10-CM

## 2013-01-31 DIAGNOSIS — Z833 Family history of diabetes mellitus: Secondary | ICD-10-CM | POA: Diagnosis not present

## 2013-01-31 DIAGNOSIS — E1029 Type 1 diabetes mellitus with other diabetic kidney complication: Secondary | ICD-10-CM | POA: Diagnosis present

## 2013-01-31 DIAGNOSIS — I5032 Chronic diastolic (congestive) heart failure: Secondary | ICD-10-CM | POA: Diagnosis present

## 2013-01-31 DIAGNOSIS — I252 Old myocardial infarction: Secondary | ICD-10-CM

## 2013-01-31 DIAGNOSIS — N390 Urinary tract infection, site not specified: Secondary | ICD-10-CM | POA: Diagnosis present

## 2013-01-31 DIAGNOSIS — E669 Obesity, unspecified: Secondary | ICD-10-CM | POA: Insufficient documentation

## 2013-01-31 DIAGNOSIS — R0602 Shortness of breath: Secondary | ICD-10-CM | POA: Diagnosis not present

## 2013-01-31 DIAGNOSIS — D62 Acute posthemorrhagic anemia: Secondary | ICD-10-CM

## 2013-01-31 DIAGNOSIS — Z8249 Family history of ischemic heart disease and other diseases of the circulatory system: Secondary | ICD-10-CM | POA: Diagnosis not present

## 2013-01-31 DIAGNOSIS — Z89439 Acquired absence of unspecified foot: Secondary | ICD-10-CM

## 2013-01-31 DIAGNOSIS — M899 Disorder of bone, unspecified: Secondary | ICD-10-CM | POA: Diagnosis present

## 2013-01-31 DIAGNOSIS — J9819 Other pulmonary collapse: Secondary | ICD-10-CM | POA: Diagnosis not present

## 2013-01-31 DIAGNOSIS — D631 Anemia in chronic kidney disease: Secondary | ICD-10-CM | POA: Diagnosis not present

## 2013-01-31 DIAGNOSIS — G9340 Encephalopathy, unspecified: Secondary | ICD-10-CM | POA: Diagnosis not present

## 2013-01-31 DIAGNOSIS — Z794 Long term (current) use of insulin: Secondary | ICD-10-CM

## 2013-01-31 DIAGNOSIS — R079 Chest pain, unspecified: Secondary | ICD-10-CM

## 2013-01-31 DIAGNOSIS — R404 Transient alteration of awareness: Secondary | ICD-10-CM | POA: Diagnosis present

## 2013-01-31 DIAGNOSIS — N2581 Secondary hyperparathyroidism of renal origin: Secondary | ICD-10-CM | POA: Diagnosis present

## 2013-01-31 DIAGNOSIS — K922 Gastrointestinal hemorrhage, unspecified: Secondary | ICD-10-CM | POA: Diagnosis not present

## 2013-01-31 DIAGNOSIS — K921 Melena: Secondary | ICD-10-CM | POA: Diagnosis present

## 2013-01-31 DIAGNOSIS — I119 Hypertensive heart disease without heart failure: Secondary | ICD-10-CM | POA: Diagnosis not present

## 2013-01-31 DIAGNOSIS — I214 Non-ST elevation (NSTEMI) myocardial infarction: Principal | ICD-10-CM | POA: Diagnosis present

## 2013-01-31 DIAGNOSIS — K208 Other esophagitis without bleeding: Secondary | ICD-10-CM | POA: Diagnosis present

## 2013-01-31 DIAGNOSIS — K5730 Diverticulosis of large intestine without perforation or abscess without bleeding: Secondary | ICD-10-CM

## 2013-01-31 DIAGNOSIS — E119 Type 2 diabetes mellitus without complications: Secondary | ICD-10-CM

## 2013-01-31 DIAGNOSIS — K219 Gastro-esophageal reflux disease without esophagitis: Secondary | ICD-10-CM | POA: Diagnosis not present

## 2013-01-31 DIAGNOSIS — Z7982 Long term (current) use of aspirin: Secondary | ICD-10-CM

## 2013-01-31 DIAGNOSIS — K449 Diaphragmatic hernia without obstruction or gangrene: Secondary | ICD-10-CM | POA: Diagnosis present

## 2013-01-31 DIAGNOSIS — I5033 Acute on chronic diastolic (congestive) heart failure: Secondary | ICD-10-CM | POA: Diagnosis not present

## 2013-01-31 DIAGNOSIS — R0789 Other chest pain: Secondary | ICD-10-CM | POA: Diagnosis not present

## 2013-01-31 DIAGNOSIS — I359 Nonrheumatic aortic valve disorder, unspecified: Secondary | ICD-10-CM | POA: Diagnosis not present

## 2013-01-31 DIAGNOSIS — Z85038 Personal history of other malignant neoplasm of large intestine: Secondary | ICD-10-CM

## 2013-01-31 DIAGNOSIS — K439 Ventral hernia without obstruction or gangrene: Secondary | ICD-10-CM | POA: Diagnosis present

## 2013-01-31 DIAGNOSIS — Z800 Family history of malignant neoplasm of digestive organs: Secondary | ICD-10-CM

## 2013-01-31 DIAGNOSIS — IMO0001 Reserved for inherently not codable concepts without codable children: Secondary | ICD-10-CM | POA: Diagnosis present

## 2013-01-31 DIAGNOSIS — Z91012 Allergy to eggs: Secondary | ICD-10-CM

## 2013-01-31 DIAGNOSIS — M949 Disorder of cartilage, unspecified: Secondary | ICD-10-CM

## 2013-01-31 DIAGNOSIS — I498 Other specified cardiac arrhythmias: Secondary | ICD-10-CM | POA: Diagnosis present

## 2013-01-31 DIAGNOSIS — I2510 Atherosclerotic heart disease of native coronary artery without angina pectoris: Secondary | ICD-10-CM | POA: Diagnosis not present

## 2013-01-31 DIAGNOSIS — S98919A Complete traumatic amputation of unspecified foot, level unspecified, initial encounter: Secondary | ICD-10-CM

## 2013-01-31 DIAGNOSIS — F29 Unspecified psychosis not due to a substance or known physiological condition: Secondary | ICD-10-CM | POA: Diagnosis not present

## 2013-01-31 DIAGNOSIS — N186 End stage renal disease: Secondary | ICD-10-CM | POA: Diagnosis not present

## 2013-01-31 DIAGNOSIS — Z79899 Other long term (current) drug therapy: Secondary | ICD-10-CM

## 2013-01-31 DIAGNOSIS — I447 Left bundle-branch block, unspecified: Secondary | ICD-10-CM | POA: Diagnosis present

## 2013-01-31 DIAGNOSIS — Z889 Allergy status to unspecified drugs, medicaments and biological substances status: Secondary | ICD-10-CM

## 2013-01-31 DIAGNOSIS — N039 Chronic nephritic syndrome with unspecified morphologic changes: Secondary | ICD-10-CM

## 2013-01-31 HISTORY — DX: Obesity, unspecified: E66.9

## 2013-01-31 HISTORY — DX: Hypertensive heart disease without heart failure: I11.9

## 2013-01-31 LAB — CBC
HEMATOCRIT: 29.1 % — AB (ref 36.0–46.0)
HEMOGLOBIN: 9.4 g/dL — AB (ref 12.0–15.0)
MCH: 29.5 pg (ref 26.0–34.0)
MCHC: 32.3 g/dL (ref 30.0–36.0)
MCV: 91.2 fL (ref 78.0–100.0)
Platelets: 163 10*3/uL (ref 150–400)
RBC: 3.19 MIL/uL — AB (ref 3.87–5.11)
RDW: 14.1 % (ref 11.5–15.5)
WBC: 7.6 10*3/uL (ref 4.0–10.5)

## 2013-01-31 LAB — GLUCOSE, CAPILLARY: Glucose-Capillary: 136 mg/dL — ABNORMAL HIGH (ref 70–99)

## 2013-01-31 LAB — BASIC METABOLIC PANEL
BUN: 41 mg/dL — AB (ref 6–23)
CO2: 25 meq/L (ref 19–32)
CREATININE: 6.59 mg/dL — AB (ref 0.50–1.10)
Calcium: 8.4 mg/dL (ref 8.4–10.5)
Chloride: 100 mEq/L (ref 96–112)
GFR calc Af Amer: 7 mL/min — ABNORMAL LOW (ref >90)
GFR calc non Af Amer: 6 mL/min — ABNORMAL LOW (ref >90)
GLUCOSE: 89 mg/dL (ref 70–99)
POTASSIUM: 4.6 meq/L (ref 3.7–5.3)
Sodium: 144 mEq/L (ref 137–147)

## 2013-01-31 LAB — POCT I-STAT TROPONIN I
TROPONIN I, POC: 0.19 ng/mL — AB (ref 0.00–0.08)
Troponin i, poc: 0.23 ng/mL (ref 0.00–0.08)

## 2013-01-31 LAB — TROPONIN I
TROPONIN I: 0.31 ng/mL — AB (ref <0.30)
Troponin I: 0.34 ng/mL (ref <0.30)

## 2013-01-31 LAB — PRO B NATRIURETIC PEPTIDE: Pro B Natriuretic peptide (BNP): 4117 pg/mL — ABNORMAL HIGH (ref 0–125)

## 2013-01-31 MED ORDER — ACETAMINOPHEN 325 MG PO TABS: 650.0000 mg | ORAL_TABLET | ORAL | Status: DC | PRN

## 2013-01-31 MED ORDER — HEPARIN BOLUS VIA INFUSION
2000.0000 [IU] | Freq: Once | INTRAVENOUS | Status: AC
  Administered 2013-02-01: 2000 [IU] via INTRAVENOUS
  Filled 2013-01-31: qty 2000

## 2013-01-31 MED ORDER — SODIUM CHLORIDE 0.9 % IV SOLN: 100.0000 mL | INTRAVENOUS | Status: DC | PRN

## 2013-01-31 MED ORDER — SODIUM CHLORIDE 0.9 % IJ SOLN
3.0000 mL | Freq: Two times a day (BID) | INTRAMUSCULAR | Status: DC
  Administered 2013-02-01 – 2013-02-04 (×7): 3 mL via INTRAVENOUS

## 2013-01-31 MED ORDER — MOMETASONE FURO-FORMOTEROL FUM 100-5 MCG/ACT IN AERO
2.0000 | INHALATION_SPRAY | Freq: Two times a day (BID) | RESPIRATORY_TRACT | Status: DC
  Administered 2013-02-02 – 2013-02-04 (×4): 2 via RESPIRATORY_TRACT
  Filled 2013-01-31: qty 8.8

## 2013-01-31 MED ORDER — NITROGLYCERIN 0.4 MG SL SUBL: 0.4000 mg | SUBLINGUAL_TABLET | SUBLINGUAL | Status: DC | PRN

## 2013-01-31 MED ORDER — ONDANSETRON HCL 4 MG/2ML IJ SOLN
4.0000 mg | Freq: Four times a day (QID) | INTRAMUSCULAR | Status: DC | PRN
Start: 2013-01-31 — End: 2013-02-04

## 2013-01-31 MED ORDER — MORPHINE SULFATE 2 MG/ML IJ SOLN: 2.0000 mg | INTRAMUSCULAR | Status: DC | PRN

## 2013-01-31 MED ORDER — HEPARIN (PORCINE) IN NACL 100-0.45 UNIT/ML-% IJ SOLN
1100.0000 [IU]/h | INTRAMUSCULAR | Status: DC
  Administered 2013-02-01: 1100 [IU]/h via INTRAVENOUS
  Administered 2013-02-01: 950 [IU]/h via INTRAVENOUS
  Filled 2013-01-31 (×2): qty 250

## 2013-01-31 MED ORDER — CALCIUM ACETATE 667 MG PO CAPS
2001.0000 mg | ORAL_CAPSULE | Freq: Three times a day (TID) | ORAL | Status: DC
  Administered 2013-02-01: 667 mg via ORAL
  Administered 2013-02-03 – 2013-02-04 (×3): 2001 mg via ORAL
  Filled 2013-01-31 (×14): qty 3

## 2013-01-31 MED ORDER — SIMVASTATIN 10 MG PO TABS
10.0000 mg | ORAL_TABLET | Freq: Every day | ORAL | Status: DC
  Administered 2013-01-31 – 2013-02-04 (×5): 10 mg via ORAL
  Filled 2013-01-31 (×5): qty 1

## 2013-01-31 MED ORDER — ALBUTEROL SULFATE (2.5 MG/3ML) 0.083% IN NEBU: 2.5000 mg | INHALATION_SOLUTION | RESPIRATORY_TRACT | Status: DC | PRN

## 2013-01-31 MED ORDER — ISOSORBIDE MONONITRATE ER 60 MG PO TB24
60.0000 mg | ORAL_TABLET | Freq: Every day | ORAL | Status: DC
  Administered 2013-01-31: 60 mg via ORAL
  Filled 2013-01-31 (×2): qty 1

## 2013-01-31 MED ORDER — PENTAFLUOROPROP-TETRAFLUOROETH EX AERO: 1.0000 "application " | INHALATION_SPRAY | CUTANEOUS | Status: DC | PRN

## 2013-01-31 MED ORDER — METOPROLOL TARTRATE 50 MG PO TABS
50.0000 mg | ORAL_TABLET | ORAL | Status: DC
  Filled 2013-01-31: qty 1

## 2013-01-31 MED ORDER — AMIODARONE HCL 100 MG PO TABS
100.0000 mg | ORAL_TABLET | Freq: Every day | ORAL | Status: DC
  Administered 2013-01-31 – 2013-02-04 (×5): 100 mg via ORAL
  Filled 2013-01-31 (×5): qty 1

## 2013-01-31 MED ORDER — HEPARIN SODIUM (PORCINE) 1000 UNIT/ML DIALYSIS
1000.0000 [IU] | INTRAMUSCULAR | Status: DC | PRN
  Filled 2013-01-31: qty 1

## 2013-01-31 MED ORDER — LIDOCAINE-PRILOCAINE 2.5-2.5 % EX CREA: 1.0000 "application " | TOPICAL_CREAM | CUTANEOUS | Status: DC | PRN

## 2013-01-31 MED ORDER — ALTEPLASE 2 MG IJ SOLR
2.0000 mg | Freq: Once | INTRAMUSCULAR | Status: AC | PRN
  Filled 2013-01-31: qty 2

## 2013-01-31 MED ORDER — LIDOCAINE HCL (PF) 1 % IJ SOLN
5.0000 mL | INTRAMUSCULAR | Status: DC | PRN
  Filled 2013-01-31: qty 5

## 2013-01-31 MED ORDER — INSULIN ASPART 100 UNIT/ML ~~LOC~~ SOLN
0.0000 [IU] | Freq: Three times a day (TID) | SUBCUTANEOUS | Status: DC
  Administered 2013-02-03 (×2): 1 [IU] via SUBCUTANEOUS
  Administered 2013-02-04 (×2): 2 [IU] via SUBCUTANEOUS

## 2013-01-31 MED ORDER — NEPRO/CARBSTEADY PO LIQD
237.0000 mL | ORAL | Status: DC | PRN
  Filled 2013-01-31: qty 237

## 2013-01-31 MED ORDER — ASPIRIN 81 MG PO CHEW: 81.0000 mg | CHEWABLE_TABLET | Freq: Every day | ORAL | Status: DC

## 2013-01-31 MED ORDER — DORZOLAMIDE HCL-TIMOLOL MAL 2-0.5 % OP SOLN
1.0000 [drp] | Freq: Two times a day (BID) | OPHTHALMIC | Status: DC
  Administered 2013-01-31 – 2013-02-04 (×7): 1 [drp] via OPHTHALMIC
  Filled 2013-01-31 (×2): qty 10

## 2013-01-31 MED ORDER — INSULIN ASPART 100 UNIT/ML ~~LOC~~ SOLN: 0.0000 [IU] | Freq: Every day | SUBCUTANEOUS | Status: DC

## 2013-01-31 NOTE — ED Provider Notes (Signed)
  Face-to-face evaluation   History: She presents for evaluation of chest pressure that started yesterday and recurred at dialysis today. She improved after a dose of nitroglycerin  Physical exam: She is alert, calm, and cooperative. She is elderly and appears older than her stated age.  Right femoral vein, straight stick done by me to obtain a second troponin; no complication.  Medical screening examination/treatment/procedure(s) were conducted as a shared visit with non-physician practitioner(s) and myself.  I personally evaluated the patient during the encounter  Richarda Blade, MD 01/31/13 2035

## 2013-01-31 NOTE — H&P (Signed)
Patient Demographics  [name], is a 68 y.o. female  MRN: [id number]   DOB - [birth year]  Admit Date - 01/31/2013  Outpatient Primary MD for the patient is Walker Kehr, MD   With History of -  Past Medical History  Diagnosis Date  . Hyperlipidemia   . Mitral valve insufficiency and aortic valve insufficiency   . Cardiomyopathy- mixed   . 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 Personal history of malignant neoplasm of large intestine   . Chronic diastolic heart failure   . Anemia   . Coronary artery disease     Previously decreased EF; echo 113 normal LV function  . Hernia, incisional     abd  . Cancer     history of colon cancer.  1986  . Arthritis   . LBBB (left bundle branch block)     sees Dr. Caryl Comes  . Atrial tachycardia     on amiod  . Unspecified essential hypertension     sees Dr. Alain Marion  . Stroke     2011/12  . Renal failure     ESRD, Dr Dunham/Dr. Lyda Kalata.  M, W, Fr      Past Surgical History  Procedure Laterality Date  . Colostomy    . Revision urostomy cutaneous    . Esophagogastroduodenoscopy  03-18-04  . Electrocardiogram  04-27-06  . Placement of new left forearm arteriovenous graft  03-11-08  . Left heart catheterization and right heart catheterization  12-10    R. heart cath showed elevated left and right heart filling pressures w/ pulmonary artery pressure elevated mildly out of proportion to the wedge. The left heart cath showed diffuse distal vessel disease as well as a 75% stenosis in the mid circumflex w/ a 90% stenosis of the ostial first obtuse marginal. These lesions were in close proximity. there was a 60-70%mild RCA stenosis.   . Arteriovenous graft placement  2010  . Foot amputation through metatarsal  10-07-10      Right foot transmetatarsal  . Colon surgery    . Cardiac catheterization    . Eye surgery      Cataract Left  . Pars plana vitrectomy  11/29/2011    Procedure: PARS PLANA VITRECTOMY WITH 23 GAUGE;  Surgeon: Adonis Brook, MD;  Location: [place];  Service: Ophthalmology;  Laterality: Right;  Right Eye 23 ga vitrectomy with membrane peel  . Pars plana vitrectomy Left 02/28/2012    Procedure: PARS PLANA VITRECTOMY WITH 23 GAUGE;  Surgeon: Adonis Brook, MD;  Location: [place];  Service: Ophthalmology;  Laterality: Left;    in for   Chief Complaint  Patient presents with  . Chest Pain     HPI  [name]  is a 68 y.o. female,  female, with known history of cardiomyopathy, end-stage renal disease, diastolic congestive heart failure, he presents with complaints of chest pain, described it as  a pressure nonradiating, worsened by ambulation and relieved by rest and sitting up, denies any nausea vomiting palpitation dizziness and diaphoresis, pain  presented prior to her hemodialysis today, she was given sublingual nitroglycerin with resolution of her chest pain, patient did not get her hemodialysis was sent to ED, in the out patient received 324 mg of by mouth aspirin by EMS, patient troponin were mildly elevated in ED, currently denies any chest pain, the patient troponins are chronically elevated but at a lower level.   Review of Systems    In addition to the HPI above, No Fever-chills, No Headache, No changes with Vision or hearing, No problems swallowing food or Liquids, Complaints of chest pressure and tightness, denies any Cough or Shortness of Breath, No Abdominal pain, No Nausea or Vommitting, Bowel movements are regular, No Blood in stool or Urine, No dysuria, No new skin rashes or bruises, No new joints pains-aches,  No new weakness, tingling, numbness in any extremity, No recent weight gain or loss, No polyuria, polydypsia or polyphagia, No significant Mental Stressors.  A  full 10 point Review of Systems was done, except as stated above, all other Review of Systems were negative.   Social History History  Substance Use Topics  . Smoking status: Never Smoker   . Smokeless tobacco: Never Used  . Alcohol Use: No     Family History Family History  Problem Relation Age of Onset  . Cancer Sister     colon  . Hypertension Other   . Hypertension Mother   . Coronary artery disease Mother   . Hypertension Father   . Diabetes Father      Prior to Admission medications   Medication Sig Start Date End Date Taking? Authorizing Provider  amiodarone (PACERONE) 200 MG tablet Take 100 mg by mouth daily.   Yes Historical Provider, MD  aspirin 81 MG chewable tablet Chew 81 mg by mouth daily.     Yes Historical Provider, MD  calcium acetate (PHOSLO) 667 MG capsule Take 2,001 mg by mouth 3 (three) times daily with meals. Take 2 capsules in the middle of each meal each day   Yes Historical Provider, MD  dorzolamide-timolol (COSOPT) 22.3-6.8 MG/ML ophthalmic solution Place 1 drop into both eyes 2 (two) times daily.  01/26/13  Yes Historical Provider, MD  Fluticasone-Salmeterol (ADVAIR) 250-50 MCG/DOSE AEPB Inhale 1 puff into the lungs daily as needed (shortness of breath).  05/28/12  Yes Evie Lacks Plotnikov, MD  insulin lispro protamine-lispro (HUMALOG 50/50) (50-50) 100 UNIT/ML SUSP Inject 20 Units into the skin daily with breakfast. 06/18/12  Yes Renato Shin, MD  isosorbide mononitrate (IMDUR) 60 MG 24 hr tablet Take 1 tablet (60 mg total) by mouth daily. 01/30/13  Yes Deboraha Sprang, MD  metoprolol (LOPRESSOR) 50 MG tablet Take 50 mg by mouth daily. Daily except on monday, Wednesday, and friday   Yes Historical Provider, MD  simvastatin (ZOCOR) 10 MG tablet Take 1 tablet (10 mg total) by mouth daily. 01/30/13  Yes Deboraha Sprang, MD    Allergies  Allergen Reactions  . Ace Inhibitors Cough  . Eggs Or Egg-Derived Products Nausea And Vomiting  . Enalapril Cough  .  Lisinopril Cough  . Omnipaque [Iohexol] Hives    Physical Exam  Vitals  Blood pressure 131/54, pulse 90, temperature 98.1 F (36.7 C), temperature source Oral, resp. rate 20, height 5' (1.524 m), weight 89.812 kg (198 lb), SpO2 100.00%.   1. General  well-nourished female  lying in  bed in NAD,  2. Normal affect and insight, Not Suicidal or Homicidal, Awake Alert, Oriented X 3.  3. No F.N deficits, ALL C.Nerves Intact, Strength 5/5 all 4 extremities, Sensation intact all 4 extremities, Plantars down going.  4. Ears and Eyes appear Normal, Conjunctivae clear, PERRLA. Moist Oral Mucosa.  5. Supple Neck, No JVD, No cervical lymphadenopathy appriciated, No Carotid Bruits.  6. Symmetrical Chest wall movement, Good air movement bilaterally, CTAB.  7. RRR, No Gallops, Rubs or Murmurs, No Parasternal Heave.  8. Positive Bowel Sounds, Abdomen Soft, Non tender, No organomegaly appriciated,No rebound -guarding or rigidity.  9.  No Cyanosis, Normal Skin Turgor, No Skin Rash or Bruise.  10. Good muscle tone,  joints appear normal , no effusions, Normal ROM.left AV fistula with good bruits   11. No Palpable Lymph Nodes in Neck or Axillae    Data Review  CBC  Recent Labs Lab 01/31/13 1452  WBC 7.6  HGB 9.4*  HCT 29.1*  PLT 163  MCV 91.2  MCH 29.5  MCHC 32.3  RDW 14.1   ------------------------------------------------------------------------------------------------------------------  Chemistries   Recent Labs Lab 01/31/13 1452  NA 144  K 4.6  CL 100  CO2 25  GLUCOSE 89  BUN 41*  CREATININE 6.59*  CALCIUM 8.4   ------------------------------------------------------------------------------------------------------------------ estimated creatinine clearance is 8.3 ml/min (by C-G formula based on Cr of 6.59). ------------------------------------------------------------------------------------------------------------------ No results found for this basename: TSH,  T4TOTAL, FREET3, T3FREE, THYROIDAB,  in the last 72 hours   Coagulation profile No results found for this basename: INR, PROTIME,  in the last 168 hours ------------------------------------------------------------------------------------------------------------------- No results found for this basename: DDIMER,  in the last 72 hours -------------------------------------------------------------------------------------------------------------------  Cardiac Enzymes  Recent Labs Lab 01/31/13 1452  TROPONINI 0.31*   ------------------------------------------------------------------------------------------------------------------ No components found with this basename: POCBNP,    ---------------------------------------------------------------------------------------------------------------  Urinalysis    Component Value Date/Time   COLORURINE RED* 05/20/2011 2219   APPEARANCEUR TURBID* 05/20/2011 2219   LABSPEC 1.015 05/20/2011 2219   PHURINE 8.0 05/20/2011 2219   GLUCOSEU 250* 05/20/2011 2219   HGBUR LARGE* 05/20/2011 2219   BILIRUBINUR LARGE* 05/20/2011 2219   KETONESUR 15* 05/20/2011 2219   PROTEINUR >300* 05/20/2011 2219   UROBILINOGEN 4.0* 05/20/2011 2219   NITRITE POSITIVE* 05/20/2011 2219   LEUKOCYTESUR LARGE* 05/20/2011 2219    ----------------------------------------------------------------------------------------------------------------  Imaging results:   Dg Chest Port 1 View  01/31/2013   CLINICAL DATA:  Chest pressure and short of breath  EXAM: PORTABLE CHEST - 1 VIEW  COMPARISON:  05/28/2012  FINDINGS: Mild cardiomegaly. Low volumes. Basilar atelectasis. Vascular congestion. No pneumothorax.  IMPRESSION: Mild cardiomegaly and vascular congestion with basilar atelectasis.   Electronically Signed   By: Maryclare Bean M.D.   On: 01/31/2013 13:02    My personal review of EKG: Rhythm NSR, Rate  98 beats/min with old LBBB.   Assessment & Plan  Principal Problem:   NSTEMI  (non-ST elevated myocardial infarction) Active Problems:   HYPERTENSION   LBBB   Coronary artery disease   DM (diabetes mellitus)   ESRD on dialysis   Chest pain    1.  chest pain with positive troponin : Cardiology consult appreciated, patient will be started on heparin drip per cardiology recommendation for non-ST elevated MI  Vs unstable angina. continue to cycle heart enzymes and follow the trend, patient is already on Imdur, beta blockers, and aspirin . 2. hypertension .blood pressure acceptable .continue with home meds . 3.end stage renal disease .patient is on hemodialysis Monday  Wednesday Friday, she missed her hemodialysis today, will consult nephrology to see if she needs to be dialyzed tonight or first thing in the morning . 4.diabetes mellitus : We'll start on insulin sliding scale .   DVT Prophylaxis patient is on full anticoagulation with heparin drip  AM Labs Ordered, also please review Full Orders  Family Communication: Admission, patients condition and plan of care including tests being ordered have been discussed with the patient  who indicate understanding and agree with the plan and Code Status.  Code Status full  Likely DC to  home Condition GUARDED  Time spent in minutes : 68min    Jerzi Tigert M.D on 01/31/2013 at 8:11 PM  Between 7am to 7pm -  After 7pm go to [email or web address] - password [email or web address]  And look for the night coverage person covering me after hours  [facility] Office  [phone]

## 2013-01-31 NOTE — Consult Note (Signed)
ANTICOAGULATION CONSULT NOTE - Initial Consult  Pharmacy Consult for Heparin Indication: chest pain/ACS  Allergies  Allergen Reactions  . Ace Inhibitors Cough  . Eggs Or Egg-Derived Products Nausea And Vomiting  . Enalapril Cough  . Lisinopril Cough  . Omnipaque [Iohexol] Hives    Patient Measurements: Height: 5' (152.4 cm) Weight: 198 lb (89.812 kg) IBW/kg (Calculated) : 45.5 Heparin Dosing Weight: 67kg  Vital Signs: Temp: 98.1 F (36.7 C) (01/23 1219) Temp src: Oral (01/23 1219) BP: 156/74 mmHg (01/23 2015) Pulse Rate: 89 (01/23 2015)  Labs:  Recent Labs  01/31/13 1452  HGB 9.4*  HCT 29.1*  PLT 163  CREATININE 6.59*  TROPONINI 0.31*    Estimated Creatinine Clearance: 8.3 ml/min (by C-G formula based on Cr of 6.59).   Medical History: Past Medical History  Diagnosis Date  . Hyperlipidemia   . Mitral valve insufficiency and aortic valve insufficiency   . Cardiomyopathy- mixed   . 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 Chronic diastolic heart failure   . Anemia   . Coronary artery disease     Previously decreased EF; echo 113 normal LV function  . Hernia, incisional     abd  . History of colon cancer     history of colon cancer.  1986  . Arthritis   . LBBB (left bundle branch block)   . Atrial tachycardia     on amiod  . Hypertensive heart disease     sees Dr. Alain Marion  . Stroke     2011/12  . Renal failure     ESRD, Dr Dunham/Dr. Lyda Kalata.  M, W, Fr  . Obesity     Medications:  No anticoagulants pta  Assessment: 67yof with ESRD on HD presents with chest pain and SOB. Troponins are positive x3. She will begin IV heparin. Baseline Hgb is 9.4 and platelets 163.  Goal of Therapy:  Heparin level 0.3-0.7 units/ml Monitor platelets by anticoagulation protocol: Yes   Plan:  1) Heparin bolus 2000 units x 1 2) Heparin drip at 950 units/hr 3) Check 8 hour heparin level 4) Daily heparin level  and CBC  Deboraha Sprang 01/31/2013,9:26 PM

## 2013-01-31 NOTE — ED Notes (Addendum)
Pt is CP free at this time. Previous note charted in error. Pt's daughter [name] notified of pt's admission per request of patient.

## 2013-01-31 NOTE — Consult Note (Addendum)
Reason for Consult: Chest Pain Referring Physician: [place]  HPI: The patient is a 68 y/o female, followed by Dr. Caryl Comes for atrial tachycardia, on Amiodarone. She has HTN, DM and HLD. She also with end-stage renal disease, on HD. Remotely she had an echo that demonstrated severe left ventricular dysfunction at about 25%. She developed recurrent tachycardia in December 2010 with a non-STEMI. Catheterization demonstrated 60% mid RCA; subtotal occlusion of the posterior descending, 70% AV circ disease and a 30% proximal LAD. She also had distal disease in her diagonal and her LAD. Ejection fraction however had improved to 45% and subsequent echoes over the last year have shown ejection fraction 55-65% on all occasions. She has chronic left bundle branch block.  She presented to [facility] with a complaint of intermittent, resting, substernal chest pressure that began last night. She had associated right arm discomfort. No SOB, diaphoresis, n/v, syncope or near syncope.  She notes associated malaise. She had return of chest pain this am during HD. She was given 1 SL NTG at HD and her pain improved. EMS was called and she was transported to [facility]. She was given 4 baby ASA. Her pain resolved completely.  W/u in the ER shows a mildly elevated troponin at 0.31. She is currently chest pain free. Prior to this episode she has not been having much in the way of chest discomfort.   Past Medical History  Diagnosis Date  . Hyperlipidemia   . Mitral valve insufficiency and aortic valve insufficiency   . Cardiomyopathy- mixed   . 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 Personal history of malignant neoplasm of large intestine   . Chronic diastolic heart failure   . Anemia   . Coronary artery disease     Previously decreased EF; echo 113 normal LV function  . Hernia, incisional     abd  . Cancer     history of colon cancer.  1986  . Arthritis   . LBBB (left  bundle branch block)     sees Dr. Caryl Comes  . Atrial tachycardia     on amiod  . Unspecified essential hypertension     sees Dr. Alain Marion  . Stroke     2011/12  . Renal failure     ESRD, Dr Dunham/Dr. Lyda Kalata.  M, W, Fr    Past Surgical History  Procedure Laterality Date  . Colostomy    . Revision urostomy cutaneous    . Esophagogastroduodenoscopy  03-18-04  . Electrocardiogram  04-27-06  . Placement of new left forearm arteriovenous graft  03-11-08  . Left heart catheterization and right heart catheterization  12-10    R. heart cath showed elevated left and right heart filling pressures w/ pulmonary artery pressure elevated mildly out of proportion to the wedge. The left heart cath showed diffuse distal vessel disease as well as a 75% stenosis in the mid circumflex w/ a 90% stenosis of the ostial first obtuse marginal. These lesions were in close proximity. there was a 60-70%mild RCA stenosis.   . Arteriovenous graft placement  2010  . 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 Colon surgery    . Cardiac catheterization    . Eye surgery      Cataract Left  . Pars plana vitrectomy  11/29/2011    Procedure: PARS PLANA VITRECTOMY WITH 23 GAUGE;  Surgeon: Adonis Brook, MD;  Location: [place];  Service: Ophthalmology;  Laterality: Right;  Right Eye 23 ga vitrectomy with membrane peel  . Pars plana vitrectomy Left 02/28/2012    Procedure: PARS PLANA VITRECTOMY WITH 23 GAUGE;  Surgeon: Adonis Brook, MD;  Location: [place];  Service: Ophthalmology;  Laterality: Left;    Family History  Problem Relation Age of Onset  . Cancer Sister     colon  . Hypertension Other   . Hypertension Mother   . Coronary artery disease Mother   . Hypertension Father   . Diabetes Father     Social History:  reports that she has never smoked. She has never used smokeless tobacco. She reports that she does not drink alcohol or use illicit drugs.  Allergies:  Allergies    Allergen Reactions  . Ace Inhibitors Cough  . Eggs Or Egg-Derived Products Nausea And Vomiting  . Enalapril Cough  . Lisinopril Cough  . Omnipaque [Iohexol] Hives    Medications:  Prior to Admission medications   Medication Sig Start Date End Date Taking? Authorizing Provider  amiodarone (PACERONE) 200 MG tablet Take 100 mg by mouth daily.   Yes Historical Provider, MD  aspirin 81 MG chewable tablet Chew 81 mg by mouth daily.     Yes Historical Provider, MD  calcium acetate (PHOSLO) 667 MG capsule Take 2,001 mg by mouth 3 (three) times daily with meals. Take 2 capsules in the middle of each meal each day   Yes Historical Provider, MD  dorzolamide-timolol (COSOPT) 22.3-6.8 MG/ML ophthalmic solution Place 1 drop into both eyes 2 (two) times daily.  01/26/13  Yes Historical Provider, MD  Fluticasone-Salmeterol (ADVAIR) 250-50 MCG/DOSE AEPB Inhale 1 puff into the lungs daily as needed (shortness of breath).  05/28/12  Yes Evie Lacks Plotnikov, MD  insulin lispro protamine-lispro (HUMALOG 50/50) (50-50) 100 UNIT/ML SUSP Inject 20 Units into the skin daily with breakfast. 06/18/12  Yes Renato Shin, MD  isosorbide mononitrate (IMDUR) 60 MG 24 hr tablet Take 1 tablet (60 mg total) by mouth daily. 01/30/13  Yes Deboraha Sprang, MD  metoprolol (LOPRESSOR) 50 MG tablet Take 50 mg by mouth daily. Daily except on monday, Wednesday, and friday   Yes Historical Provider, MD  simvastatin (ZOCOR) 10 MG tablet Take 1 tablet (10 mg total) by mouth daily. 01/30/13  Yes Deboraha Sprang, MD     Results for orders placed during the hospital encounter of 01/31/13 (from the past 48 hour(s))  CBC     Status: Abnormal   Collection Time    01/31/13  2:52 PM      Result Value Range   WBC 7.6  4.0 - 10.5 K/uL   RBC 3.19 (*) 3.87 - 5.11 MIL/uL   Hemoglobin 9.4 (*) 12.0 - 15.0 g/dL   HCT 29.1 (*) 36.0 - 46.0 %   MCV 91.2  78.0 - 100.0 fL   MCH 29.5  26.0 - 34.0 pg   MCHC 32.3  30.0 - 36.0 g/dL   RDW 14.1  11.5 - 15.5  %   Platelets 163  150 - 400 K/uL  BASIC METABOLIC PANEL     Status: Abnormal   Collection Time    01/31/13  2:52 PM      Result Value Range   Sodium 144  137 - 147 mEq/L   Potassium 4.6  3.7 - 5.3 mEq/L   Chloride 100  96 - 112 mEq/L   CO2 25  19 - 32 mEq/L   Glucose, Bld 89  70 -  99 mg/dL   BUN 41 (*) 6 - 23 mg/dL   Creatinine, Ser 6.59 (*) 0.50 - 1.10 mg/dL   Calcium 8.4  8.4 - 10.5 mg/dL   GFR calc non Af Amer 6 (*) >90 mL/min   GFR calc Af Amer 7 (*) >90 mL/min   Comment: (NOTE)     The eGFR has been calculated using the CKD EPI equation.     This calculation has not been validated in all clinical situations.     eGFR's persistently <90 mL/min signify possible Chronic Kidney     Disease.  PRO B NATRIURETIC PEPTIDE     Status: Abnormal   Collection Time    01/31/13  2:52 PM      Result Value Range   Pro B Natriuretic peptide (BNP) 4117.0 (*) 0 - 125 pg/mL  TROPONIN I     Status: Abnormal   Collection Time    01/31/13  2:52 PM      Result Value Range   Troponin I 0.31 (*) <0.30 ng/mL  POCT I-STAT TROPONIN I     Status: Abnormal   Collection Time    01/31/13  3:05 PM      Result Value Range   Troponin i, poc 0.23 (*) 0.00 - 0.08 ng/mL   Collection Time    01/31/13  6:27 PM      Result Value Range   Troponin i, poc 0.19 (*) 0.00 - 0.08 ng/mL    Dg Chest Port 1 View  01/31/2013   CLINICAL DATA:  Chest pressure and short of breath  EXAM: PORTABLE CHEST - 1 VIEW  COMPARISON:  05/28/2012  FINDINGS: Mild cardiomegaly. Low volumes. Basilar atelectasis. Vascular congestion. No pneumothorax.  IMPRESSION: Mild cardiomegaly and vascular congestion with basilar atelectasis.   Electronically Signed   By: Maryclare Bean M.D.   On: 01/31/2013 13:02    Review of Systems  Constitutional: Positive for malaise/fatigue.  Respiratory: Negative for shortness of breath.   Cardiovascular: Positive for chest pain. Negative for orthopnea, leg swelling and PND.  Gastrointestinal: Negative for  nausea and vomiting.  Neurological: Negative for dizziness and loss of consciousness.  All other systems reviewed and are negative.   Blood pressure 131/54, pulse 90, temperature 98.1 F (36.7 C), temperature source Oral, resp. rate 20, height 5' (1.524 m), weight 198 lb (89.812 kg), SpO2 100.00%. Physical Exam  Constitutional: She is oriented to person, place, and time. She appears well-developed and well-nourished. No distress.  Large obese African American female in no acute distress  Neck: No JVD present. Carotid bruit is not present.  Cardiovascular: Normal rate, regular rhythm, normal heart sounds and intact distal pulses.  Exam reveals no gallop.   No murmur heard. Respiratory: Effort normal and breath sounds normal. No respiratory distress. She has no wheezes. She has no rales.  GI: Soft. Bowel sounds are normal. She exhibits no distension and no mass. There is no tenderness.  Colostomy is present  Musculoskeletal: She exhibits no edema.  Transmetatarsal amputation on the right. Functioning dialysis fistula in the left arm  Neurological: She is alert and oriented to person, place, and time.  Skin: Skin is warm and dry. She is not diaphoretic.  Psychiatric: She has a normal mood and affect. Her behavior is normal.    Assessment/Plan:  1. Prolonged chest discomfort with mild elevated troponins consistent with a non-ST elevation myocardial infarction 2. Hypertensive heart disease 3. Coronary artery disease 4. Diabetes mellitus with renal complications 5. End stage  renal disease on hemodialysis  Plan: 68 y/o AAF, with HTN, HLD, DM and ESRD presents w/ symptoms concerning for U/A. Initial troponin + at 0.31. Pt will be admitted. Continue to cycle enzymes. IV heparin per pharmacy. Resume home meds: on ASA, BB, nitrate and statin. Also continue Amiodarone.   Would cycle troponins and further workup will depend would EKG and troponins do. Consultation with renal she may need dialysis  either tonight or in the morning. She'll need to have a repeat echocardiogram as she has marked LVH in the past.  W. Doristine Church. MD [facility]  01/31/2013

## 2013-01-31 NOTE — ED Notes (Signed)
Dr. Eulis Foster at bedside to do Femoral Stick

## 2013-01-31 NOTE — ED Notes (Addendum)
Contacted lab to add on Troponin with permission of Carney PA.

## 2013-01-31 NOTE — ED Notes (Signed)
To ED from dialysis via [facility] for chest pressure since last night with SOB, ST 104, dialysis gave 1 SL nitro, EMS gave 324 ASA, denies pain on arrival, A/O X4, ambulatory, no N/V, NAD

## 2013-01-31 NOTE — ED Notes (Signed)
Attempted Phlebotomy draw x2 without success. 2nd RN to attempt

## 2013-01-31 NOTE — ED Notes (Signed)
Phlebotomy notified, IV team unable to draw labs with IV start.

## 2013-01-31 NOTE — ED Notes (Signed)
[name], [place] made aware of elevated Troponin 0.31

## 2013-01-31 NOTE — Progress Notes (Signed)
Pt c/o of blood in stool. Pt concerned with worsening bleeding if iv heparin is started. Walden Field NP notified. Advised to wait and start the drip after 2nd troponin resulted. 2nd troponin has already been drawn. Will continue to monitor.

## 2013-01-31 NOTE — ED Provider Notes (Signed)
CSN: [id number]     Arrival date & time 01/31/13  1209 History   First MD Initiated Contact with Patient 01/31/13 1217     Chief Complaint  Patient presents with  . Chest Pain   (Consider location/radiation/quality/duration/timing/severity/associated sxs/prior Treatment) HPI Comments: Patient presents to the emergency department with chief complaint of chest pressure. She states that the symptoms began last night before she went to bed. She states that she was able to sleep through the night, but when she woke she continued to feel the chest pressure. She states that she went to dialysis this morning and mentioned that the chest pressure, and then was referred to the emergency department. She did not have any of her dialysis treatment. She endorses mild shortness of breath, but denies any diaphoresis. She has an extensive medical history. She states that she was given nitroglycerin at dialysis, and this improved the pressure. She denies any pressure now, but states that she "feels funny." She was given aspirin by EMS. There no aggravating or alleviating factors. The pain is not reproducible. She states that while she had the pressure, she noticed some subjective numbness to the right arm.  The history is provided by the patient. No language interpreter was used.    Past Medical History  Diagnosis Date  . Hyperlipidemia   . Mitral valve insufficiency and aortic valve insufficiency   . Cardiomyopathy- mixed   . 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 Personal history of malignant neoplasm of large intestine   . Chronic diastolic heart failure   . Anemia   . Coronary artery disease     Previously decreased EF; echo 113 normal LV function  . Hernia, incisional     abd  . Cancer     history of colon cancer.  1986  . Arthritis   . LBBB (left bundle branch block)     sees Dr. Caryl Comes  . Atrial tachycardia     on amiod  . Unspecified  essential hypertension     sees Dr. Alain Marion  . Stroke     2011/12  . Renal failure     ESRD, Dr Dunham/Dr. Lyda Kalata.  M, W, Fr   Past Surgical History  Procedure Laterality Date  . Colostomy    . Revision urostomy cutaneous    . Esophagogastroduodenoscopy  03-18-04  . Electrocardiogram  04-27-06  . Placement of new left forearm arteriovenous graft  03-11-08  . Left heart catheterization and right heart catheterization  12-10    R. heart cath showed elevated left and right heart filling pressures w/ pulmonary artery pressure elevated mildly out of proportion to the wedge. The left heart cath showed diffuse distal vessel disease as well as a 75% stenosis in the mid circumflex w/ a 90% stenosis of the ostial first obtuse marginal. These lesions were in close proximity. there was a 60-70%mild RCA stenosis.   . Arteriovenous graft placement  2010  . 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 Colon surgery    . Cardiac catheterization    . Eye surgery      Cataract Left  . Pars plana vitrectomy  11/29/2011    Procedure: PARS PLANA VITRECTOMY WITH 23 GAUGE;  Surgeon: Adonis Brook, MD;  Location: [facility];  Service: Ophthalmology;  Laterality: Right;  Right Eye 23 ga vitrectomy with membrane peel  . Pars plana vitrectomy Left 02/28/2012  Procedure: PARS PLANA VITRECTOMY WITH 23 GAUGE;  Surgeon: Adonis Brook, MD;  Location: [facility];  Service: Ophthalmology;  Laterality: Left;   Family History  Problem Relation Age of Onset  . Cancer Sister     colon  . Hypertension Other   . Hypertension Mother   . Coronary artery disease Mother   . Hypertension Father   . Diabetes Father    History  Substance Use Topics  . Smoking status: Never Smoker   . Smokeless tobacco: Never Used  . Alcohol Use: No   OB History   Grav Para Term Preterm Abortions TAB SAB Ect Mult Living                 Review of Systems  All other systems reviewed and are  negative.    Allergies  Ace inhibitors; Eggs or egg-derived products; Enalapril; Lisinopril; and Omnipaque  Home Medications   Current Outpatient Rx  Name  Route  Sig  Dispense  Refill  . amiodarone (PACERONE) 200 MG tablet      Take 1/2 tablet by mouth once daily.   45 tablet   3   . aspirin 81 MG chewable tablet   Oral   Chew 81 mg by mouth daily.           . calcium acetate (PHOSLO) 667 MG capsule   Oral   Take 2,001 mg by mouth 3 (three) times daily with meals. Take 2 capsules in the middle of each meal each day         . dorzolamide-timolol (COSOPT) 22.3-6.8 MG/ML ophthalmic solution      As directed         . Fluticasone-Salmeterol (ADVAIR) 250-50 MCG/DOSE AEPB   Inhalation   Inhale 1 puff into the lungs as needed.         . insulin lispro protamine-lispro (HUMALOG 50/50) (50-50) 100 UNIT/ML SUSP   Subcutaneous   Inject 20 Units into the skin daily with breakfast.   10 mL   11   . isosorbide mononitrate (IMDUR) 60 MG 24 hr tablet   Oral   Take 1 tablet (60 mg total) by mouth daily.   90 tablet   3   . metoprolol (LOPRESSOR) 50 MG tablet   Oral   Take 50 mg by mouth daily. Daily except on monday, Wednesday, and friday         . simvastatin (ZOCOR) 10 MG tablet   Oral   Take 1 tablet (10 mg total) by mouth daily.   90 tablet   3    BP 145/67  Pulse 98  Temp(Src) 98.1 F (36.7 C) (Oral)  Resp 18  Ht 5' (1.524 m)  Wt 198 lb (89.812 kg)  BMI 38.67 kg/m2  SpO2 94% Physical Exam  Nursing note and vitals reviewed. Constitutional: She is oriented to person, place, and time. She appears well-developed and well-nourished.  Obese  HENT:  Head: Normocephalic and atraumatic.  Eyes: Conjunctivae and EOM are normal. Pupils are equal, round, and reactive to light.  Neck: Normal range of motion. Neck supple.  Cardiovascular: Normal rate and regular rhythm.  Exam reveals no gallop and no friction rub.   No murmur heard. Pulmonary/Chest: Effort  normal and breath sounds normal. No respiratory distress. She has no wheezes. She has no rales. She exhibits no tenderness.  Abdominal: Soft. She exhibits no distension and no mass. There is no tenderness. There is no rebound and no guarding.  Musculoskeletal: Normal range of  motion. She exhibits no edema and no tenderness.  Neurological: She is alert and oriented to person, place, and time.  Skin: Skin is warm and dry.  Psychiatric: She has a normal mood and affect. Her behavior is normal. Judgment and thought content normal.    ED Course  Procedures (including critical care time) Results for orders placed during the hospital encounter of 01/31/13  CBC      Result Value Range   WBC 7.6  4.0 - 10.5 K/uL   RBC 3.19 (*) 3.87 - 5.11 MIL/uL   Hemoglobin 9.4 (*) 12.0 - 15.0 g/dL   HCT 29.1 (*) 36.0 - 46.0 %   MCV 91.2  78.0 - 100.0 fL   MCH 29.5  26.0 - 34.0 pg   MCHC 32.3  30.0 - 36.0 g/dL   RDW 14.1  11.5 - 15.5 %   Platelets 163  150 - 400 K/uL  BASIC METABOLIC PANEL      Result Value Range   Sodium 144  137 - 147 mEq/L   Potassium 4.6  3.7 - 5.3 mEq/L   Chloride 100  96 - 112 mEq/L   CO2 25  19 - 32 mEq/L   Glucose, Bld 89  70 - 99 mg/dL   BUN 41 (*) 6 - 23 mg/dL   Creatinine, Ser 6.59 (*) 0.50 - 1.10 mg/dL   Calcium 8.4  8.4 - 10.5 mg/dL   GFR calc non Af Amer 6 (*) >90 mL/min   GFR calc Af Amer 7 (*) >90 mL/min  PRO B NATRIURETIC PEPTIDE      Result Value Range   Pro B Natriuretic peptide (BNP) 4117.0 (*) 0 - 125 pg/mL  TROPONIN I      Result Value Range   Troponin I 0.31 (*) <0.30 ng/mL  POCT I-STAT TROPONIN I      Result Value Range   Troponin i, poc 0.23 (*) 0.00 - 0.08 ng/mL   Comment NOTIFIED PHYSICIAN     Comment 3           POCT I-STAT TROPONIN I      Result Value Range   Troponin i, poc 0.19 (*) 0.00 - 0.08 ng/mL   Comment NOTIFIED PHYSICIAN     Comment 3            Dg Chest Port 1 View  01/31/2013   CLINICAL DATA:  Chest pressure and short of breath   EXAM: PORTABLE CHEST - 1 VIEW  COMPARISON:  05/28/2012  FINDINGS: Mild cardiomegaly. Low volumes. Basilar atelectasis. Vascular congestion. No pneumothorax.  IMPRESSION: Mild cardiomegaly and vascular congestion with basilar atelectasis.   Electronically Signed   By: Maryclare Bean M.D.   On: 01/31/2013 13:02      EKG Interpretation    Date/Time:  Friday January 31 2013 12:19:30 EST Ventricular Rate:  98 PR Interval:  176 QRS Duration: 158 QT Interval:  420 QTC Calculation: 536 R Axis:   -48 Text Interpretation:  Sinus tachycardia Ventricular premature complex Probable left atrial enlargement Left bundle branch block Baseline wander in lead(s) V6 since last tracing no significant change Confirmed by WENTZ  MD, ELLIOTT ([id number]) on 01/31/2013 12:40:45 PM            MDM   1. Chest pain     Patient with extensive medical history, including known CAD. She has a cardiologist, Dr. Jens Som. She is received aspirin and nitroglycerin. Will check labs, EKG, chest x-ray. Patient is on Monday, Wednesday, Friday schedule  for dialysis. She did not complete her dialysis today.  3:38 PM Delay in getting labs drawn for the patient.  Nursing staff and IV team were unsuccessful on multiple attempts.  At last, phlebotomy was able to get labs.  Troponin was mildly elevated at 0.23.  I discussed this with Dr. Eulis Foster, who tells me that it is likely due to her chronic renal disease.  Patient did have a prior elevated reading of 0.14 on another visit about a year and a half ago.  Dr. Eulis Foster recommends repeat troponin and EKG in a few hours.  If unchanged, discharge to home.  Patient can go to dialysis tomorrow at 12:00 at her outpatient center. If she is admitted, be advised that she is to get vanc with HD.  7:14 PM Patient discussed with Dr. Eulis Foster, who recommends consulting cardiology.  7:46 PM Patient discussed with Dr. Wynonia Lawman, who recommends admission by medicine, and he will consult.  Discussed the patient  with Dr. Waldron Labs, who will admit the patient.     Montine Circle, PA-C 01/31/13 1947

## 2013-02-01 ENCOUNTER — Encounter (HOSPITAL_COMMUNITY): Payer: Self-pay | Admitting: Physician Assistant

## 2013-02-01 DIAGNOSIS — D649 Anemia, unspecified: Secondary | ICD-10-CM | POA: Diagnosis not present

## 2013-02-01 DIAGNOSIS — I214 Non-ST elevation (NSTEMI) myocardial infarction: Secondary | ICD-10-CM | POA: Diagnosis not present

## 2013-02-01 DIAGNOSIS — R079 Chest pain, unspecified: Secondary | ICD-10-CM | POA: Diagnosis present

## 2013-02-01 DIAGNOSIS — N390 Urinary tract infection, site not specified: Secondary | ICD-10-CM | POA: Diagnosis not present

## 2013-02-01 DIAGNOSIS — D631 Anemia in chronic kidney disease: Secondary | ICD-10-CM | POA: Diagnosis not present

## 2013-02-01 DIAGNOSIS — N186 End stage renal disease: Secondary | ICD-10-CM | POA: Diagnosis not present

## 2013-02-01 DIAGNOSIS — K219 Gastro-esophageal reflux disease without esophagitis: Secondary | ICD-10-CM

## 2013-02-01 DIAGNOSIS — E119 Type 2 diabetes mellitus without complications: Secondary | ICD-10-CM | POA: Diagnosis not present

## 2013-02-01 DIAGNOSIS — K5730 Diverticulosis of large intestine without perforation or abscess without bleeding: Secondary | ICD-10-CM

## 2013-02-01 DIAGNOSIS — D62 Acute posthemorrhagic anemia: Secondary | ICD-10-CM | POA: Diagnosis not present

## 2013-02-01 DIAGNOSIS — I119 Hypertensive heart disease without heart failure: Secondary | ICD-10-CM

## 2013-02-01 DIAGNOSIS — I359 Nonrheumatic aortic valve disorder, unspecified: Secondary | ICD-10-CM | POA: Diagnosis not present

## 2013-02-01 DIAGNOSIS — K922 Gastrointestinal hemorrhage, unspecified: Secondary | ICD-10-CM | POA: Diagnosis not present

## 2013-02-01 LAB — GLUCOSE, CAPILLARY
GLUCOSE-CAPILLARY: 92 mg/dL (ref 70–99)
GLUCOSE-CAPILLARY: 133 mg/dL — AB (ref 70–99)
Glucose-Capillary: 101 mg/dL — ABNORMAL HIGH (ref 70–99)
Glucose-Capillary: 95 mg/dL (ref 70–99)

## 2013-02-01 LAB — CBC
HCT: 22.4 % — ABNORMAL LOW (ref 36.0–46.0)
HCT: 24 % — ABNORMAL LOW (ref 36.0–46.0)
HEMOGLOBIN: 8 g/dL — AB (ref 12.0–15.0)
Hemoglobin: 7.4 g/dL — ABNORMAL LOW (ref 12.0–15.0)
MCH: 29.5 pg (ref 26.0–34.0)
MCH: 29.9 pg (ref 26.0–34.0)
MCHC: 33 g/dL (ref 30.0–36.0)
MCHC: 33.3 g/dL (ref 30.0–36.0)
MCV: 89.2 fL (ref 78.0–100.0)
MCV: 89.6 fL (ref 78.0–100.0)
PLATELETS: 193 10*3/uL (ref 150–400)
Platelets: 185 10*3/uL (ref 150–400)
RBC: 2.51 MIL/uL — ABNORMAL LOW (ref 3.87–5.11)
RBC: 2.68 MIL/uL — AB (ref 3.87–5.11)
RDW: 14.3 % (ref 11.5–15.5)
RDW: 14.1 % (ref 11.5–15.5)
WBC: 7.9 10*3/uL (ref 4.0–10.5)
WBC: 7.9 10*3/uL (ref 4.0–10.5)

## 2013-02-01 LAB — BASIC METABOLIC PANEL
BUN: 48 mg/dL — ABNORMAL HIGH (ref 6–23)
CHLORIDE: 103 meq/L (ref 96–112)
CO2: 24 mEq/L (ref 19–32)
Calcium: 7.8 mg/dL — ABNORMAL LOW (ref 8.4–10.5)
Creatinine, Ser: 7.63 mg/dL — ABNORMAL HIGH (ref 0.50–1.10)
GFR, EST AFRICAN AMERICAN: 6 mL/min — AB (ref >90)
GFR, EST NON AFRICAN AMERICAN: 5 mL/min — AB (ref >90)
Glucose, Bld: 106 mg/dL — ABNORMAL HIGH (ref 70–99)
POTASSIUM: 4.9 meq/L (ref 3.7–5.3)
Sodium: 146 mEq/L (ref 137–147)

## 2013-02-01 LAB — TROPONIN I
TROPONIN I: 0.61 ng/mL — AB (ref <0.30)
Troponin I: 0.39 ng/mL (ref <0.30)

## 2013-02-01 LAB — VANCOMYCIN, RANDOM: VANCOMYCIN RM: 6 ug/mL

## 2013-02-01 LAB — MRSA PCR SCREENING: MRSA by PCR: POSITIVE — AB

## 2013-02-01 LAB — PREPARE RBC (CROSSMATCH)

## 2013-02-01 LAB — HEPARIN LEVEL (UNFRACTIONATED): HEPARIN UNFRACTIONATED: 0.14 [IU]/mL — AB (ref 0.30–0.70)

## 2013-02-01 MED ORDER — RENA-VITE PO TABS
1.0000 | ORAL_TABLET | Freq: Every day | ORAL | Status: DC
  Administered 2013-02-03: 1 via ORAL
  Filled 2013-02-01 (×4): qty 1

## 2013-02-01 MED ORDER — HALOPERIDOL LACTATE 5 MG/ML IJ SOLN
5.0000 mg | Freq: Once | INTRAMUSCULAR | Status: AC
  Administered 2013-02-02: 2.5 mg via INTRAVENOUS
  Filled 2013-02-01: qty 1

## 2013-02-01 MED ORDER — DOXERCALCIFEROL 4 MCG/2ML IV SOLN
2.0000 ug | INTRAVENOUS | Status: DC
  Administered 2013-02-01 – 2013-02-03 (×2): 2 ug via INTRAVENOUS
  Filled 2013-02-01: qty 2

## 2013-02-01 MED ORDER — DARBEPOETIN ALFA-POLYSORBATE 100 MCG/0.5ML IJ SOLN
100.0000 ug | INTRAMUSCULAR | Status: DC
  Administered 2013-02-01: 100 ug via INTRAVENOUS
  Filled 2013-02-01: qty 0.5

## 2013-02-01 MED ORDER — VANCOMYCIN HCL IN DEXTROSE 1-5 GM/200ML-% IV SOLN
1000.0000 mg | INTRAVENOUS | Status: AC
  Administered 2013-02-01: 1000 mg via INTRAVENOUS
  Filled 2013-02-01 (×3): qty 200

## 2013-02-01 MED ORDER — DOXERCALCIFEROL 4 MCG/2ML IV SOLN
INTRAVENOUS | Status: AC
  Filled 2013-02-01: qty 2

## 2013-02-01 MED ORDER — DOXERCALCIFEROL 4 MCG/2ML IV SOLN
2.0000 ug | Freq: Once | INTRAVENOUS | Status: DC
  Filled 2013-02-01: qty 2

## 2013-02-01 MED ORDER — DEXTROSE 5 % IV SOLN
2.0000 g | INTRAVENOUS | Status: DC
  Administered 2013-02-03: 2 g via INTRAVENOUS
  Filled 2013-02-01 (×4): qty 2

## 2013-02-01 MED ORDER — PANTOPRAZOLE SODIUM 40 MG IV SOLR
40.0000 mg | Freq: Two times a day (BID) | INTRAVENOUS | Status: DC
  Administered 2013-02-02 (×3): 40 mg via INTRAVENOUS
  Filled 2013-02-01 (×6): qty 40

## 2013-02-01 MED ORDER — METOPROLOL TARTRATE 50 MG PO TABS
50.0000 mg | ORAL_TABLET | ORAL | Status: DC
  Administered 2013-02-04: 50 mg via ORAL
  Filled 2013-02-01 (×3): qty 1

## 2013-02-01 NOTE — Consult Note (Signed)
[facility] Consult: 11:10 AM 02/01/2013  LOS: 1 day    Referring Provider: Dr Tyrell Antonio  Primary Care Physician:  Walker Kehr, MD Primary Gastroenterologist:  Dr. Carlean Purl    Reason for Consultation:  Blood seen in ostomy   HPI: [name] is a 68 y.o. female.  Hc CAD, Hx of cardiomyopathy but latest EF 55 to 65%.  Non stemi in 2010. 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  On amiodarone for atrial tachycardia.  Colon cancer in 1986.  S/p 1986 colostomy and urostomy .  March 2012 referred to Dr Barry Dienes for the herniated bowel but no surgery was performed.   Admitted yesterday with chest pain, relieved with NTG.  No nausea, diaphoresis. Mild increase Troponins, ruled in for non STEMI.  Started on heparin.  This morning red blood noted in ostomy bag.  Hgb is 7.4 today, from 9.4 yesterday.  Was 10.2 in 02/2012.  2 units of blood ordered to be given during HD.   On Wednesday and Thursday, she saw red blood leach out of the colostomy contents after emptying bag into commode.  Stool itself was brown and loose as usual.   She had ss chest pressure Thursday night that passed.  It recurred as she arrived at HD Friday AM.  Her HD session was cancelled and she was sent for admission to hospital.  Chest pain relieved by SL NTG.  No chest pain today.   Not on PPI or H2 blocker at home.  Rare episodes of heartburn specific to culprit foods.  Takes non-specified med for this when needed.  No recent increase in heartburn.  No nausea.  Appetite stable but overall less than it was several years ago.  Weight stable for last few years. No SOB, no palpitation, no dysphagia, no increase in stool output. No NSAIDs but takes ASA daily and given 4 ASA by EMS yesterday.   Receives epo/procrit at dialysis.  Transfusions of blood many years ago.   Treated with parenteral iron before she started HD.        Past Medical History  Diagnosis Date  . Hyperlipidemia   . Mitral valve insufficiency and aortic valve insufficiency   . Cardiomyopathy- mixed   . 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 Chronic diastolic heart failure   . Anemia   . Coronary artery disease     Previously decreased EF; echo 113 normal LV function  . Hernia, incisional     abd  . History of colon cancer     history of colon cancer.  1986  . Arthritis   . LBBB (left bundle branch block)   . Atrial tachycardia     on amiod  . Hypertensive heart disease     sees Dr. Alain Marion  . Stroke     2011/12  . Renal failure     ESRD, Dr Dunham/Dr. Lyda Kalata.  M, W, Fr  . Obesity     Past Surgical History  Procedure Laterality Date  . Colostomy    .  Revision urostomy cutaneous    . Esophagogastroduodenoscopy  03-18-04  . Electrocardiogram  04-27-06  . Placement of new left forearm arteriovenous graft  03-11-08  . Left heart catheterization and right heart catheterization  12-10    R. heart cath showed elevated left and right heart filling pressures w/ pulmonary artery pressure elevated mildly out of proportion to the wedge. The left heart cath showed diffuse distal vessel disease as well as a 75% stenosis in the mid circumflex w/ a 90% stenosis of the ostial first obtuse marginal. These lesions were in close proximity. there was a 60-70%mild RCA stenosis.   . Arteriovenous graft placement  2010  . 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 Colon surgery    . Cardiac catheterization    . Eye surgery      Cataract Left  . Pars plana vitrectomy  11/29/2011    Procedure: PARS PLANA VITRECTOMY WITH 23 GAUGE;  Surgeon: Adonis Brook, MD;  Location: [facility];  Service: Ophthalmology;  Laterality: Right;  Right Eye 23 ga vitrectomy with membrane peel  . Pars plana vitrectomy Left 02/28/2012     Procedure: PARS PLANA VITRECTOMY WITH 23 GAUGE;  Surgeon: Adonis Brook, MD;  Location: [facility];  Service: Ophthalmology;  Laterality: Left;    Prior to Admission medications   Medication Sig Start Date End Date Taking? Authorizing Provider  amiodarone (PACERONE) 200 MG tablet Take 100 mg by mouth daily.   Yes Historical Provider, MD  aspirin 81 MG chewable tablet Chew 81 mg by mouth daily.     Yes Historical Provider, MD  calcium acetate (PHOSLO) 667 MG capsule Take 2,001 mg by mouth 3 (three) times daily with meals. Take 2 capsules in the middle of each meal each day   Yes Historical Provider, MD  dorzolamide-timolol (COSOPT) 22.3-6.8 MG/ML ophthalmic solution Place 1 drop into both eyes 2 (two) times daily.  01/26/13  Yes Historical Provider, MD  Fluticasone-Salmeterol (ADVAIR) 250-50 MCG/DOSE AEPB Inhale 1 puff into the lungs daily as needed (shortness of breath).  05/28/12  Yes Evie Lacks Plotnikov, MD  insulin lispro protamine-lispro (HUMALOG 50/50) (50-50) 100 UNIT/ML SUSP Inject 20 Units into the skin daily with breakfast. 06/18/12  Yes Renato Shin, MD  isosorbide mononitrate (IMDUR) 60 MG 24 hr tablet Take 1 tablet (60 mg total) by mouth daily. 01/30/13  Yes Deboraha Sprang, MD  metoprolol (LOPRESSOR) 50 MG tablet Take 50 mg by mouth daily. Daily except on monday, Wednesday, and friday   Yes Historical Provider, MD  simvastatin (ZOCOR) 10 MG tablet Take 1 tablet (10 mg total) by mouth daily. 01/30/13  Yes Deboraha Sprang, MD    Scheduled Meds: . amiodarone  100 mg Oral Daily  . calcium acetate  2,001 mg Oral TID WC  . dorzolamide-timolol  1 drop Both Eyes BID  . insulin aspart  0-5 Units Subcutaneous QHS  . insulin aspart  0-9 Units Subcutaneous TID WC  . metoprolol  50 mg Oral Q T,Th,S,Su  . mometasone-formoterol  2 puff Inhalation BID  . pantoprazole (PROTONIX) IV  40 mg Intravenous Q12H  . simvastatin  10 mg Oral Daily  . sodium chloride  3 mL Intravenous Q12H   Infusions:   PRN  Meds: sodium chloride, sodium chloride, acetaminophen, albuterol, feeding supplement (NEPRO CARB STEADY), lidocaine (PF), lidocaine-prilocaine, morphine injection, nitroGLYCERIN, ondansetron (ZOFRAN) IV, pentafluoroprop-tetrafluoroeth   Allergies as of 01/31/2013 - Review Complete 01/31/2013  Allergen Reaction Noted  .  Ace inhibitors Cough   . Eggs or egg-derived products Nausea And Vomiting 02/26/2012  . Enalapril Cough 11/08/2010  . Lisinopril Cough 05/18/2006  . Omnipaque [iohexol] Hives 11/08/2010    Family History  Problem Relation Age of Onset  . Cancer Sister     colon  . Hypertension Other   . Hypertension Mother   . Coronary artery disease Mother   . Hypertension Father   . Diabetes Father     History   Social History  . Marital Status: Single    Spouse Name: N/A    Number of Children: N/A  . Years of Education: N/A   Occupational History  . Not on file.   Social History Main Topics  . Smoking status: Never Smoker   . Smokeless tobacco: Never Used  . Alcohol Use: No  . Drug Use: No  . Sexual Activity: No   Social History Narrative  . Lives with her daughter in their home.     REVIEW OF SYSTEMS: Constitutional:  Per HPI ENT:  No nose bleeds Pulm:  No SOB or cough.  CV:  No palpitations, no LE edema.  GU:  No hematuria and urine goes into ostomy bag, no frequency GI:  Per HPI Heme:  Per HPI   Transfusions:  Per HPI Neuro:  No headaches, no peripheral tingling or numbness Derm:  No itching, no rash or sores.  Endocrine:  No sweats or chills.  No polyuria or dysuria Immunization:  Up to date on flu shot Travel:  None beyond local counties in last few months.    PHYSICAL EXAM: Vital signs in last 24 hours: Filed Vitals:   02/01/13 1038  BP: 119/54  Pulse: 91  Temp:   Resp:    Wt Readings from Last 3 Encounters:  01/31/13 91.3 kg (201 lb 4.5 oz)  01/30/13 89.812 kg (198 lb)  01/30/13 90.719 kg (200 lb)   General: pleasant, alert,  comfortable and well appearing AAF in NAD Head:  No swelling or asymmetry  Eyes:  No icterus or pallor Ears:  Not HOH  Nose:  No discharge or congestion Mouth:  Clear, moist, fair dental condition. Neck:  No JVD or bruits,   No mass or TMG Lungs:  Clear bil.  Good BS.  No adventitious sounds.  No dyspnea or cough Heart: RRR.  No MRG Abdomen:  Soft, obese.  Pale yellow urine in ostomy on far right abdomen, maroon bloody stool in ostomy bag.   Rectal: deferred   Musc/Skeltl: no joint swelling or contractures Extremities:  No CCE  Neurologic:  Pleasant, fully oriented,  Good historian.  No tremor.  No limb weakness Skin:  No rash or sores Tattoos:  None.  Nodes:  No cervical adenopathy   Psych:  Pleasant, not depressed or agitated.  Relaxed. Cooperative   Intake/Output from previous day: 01/23 0701 - 01/24 0700 In: 277.5 [P.O.:240; I.V.:37.5] Out: -  Intake/Output this shift:    LAB RESULTS:  Recent Labs  01/31/13 1452 02/01/13 0456  WBC 7.6 7.9  HGB 9.4* 7.4*  HCT 29.1* 22.4*  PLT 163 193  MCV     89  BMET Lab Results  Component Value Date   NA 146 02/01/2013   NA 144 01/31/2013   NA 137 02/28/2012   K 4.9 02/01/2013   K 4.6 01/31/2013   K 4.9 02/28/2012   CL 103 02/01/2013   CL 100 01/31/2013   CL 97 02/28/2012   CO2 24 02/01/2013  CO2 25 01/31/2013   CO2 23 02/28/2012   GLUCOSE 106* 02/01/2013   GLUCOSE 89 01/31/2013   GLUCOSE 91 02/28/2012   BUN 48* 02/01/2013   BUN 41* 01/31/2013   BUN 49* 02/28/2012   CREATININE 7.63* 02/01/2013   CREATININE 6.59* 01/31/2013   CREATININE 6.86* 02/28/2012   CALCIUM 7.8* 02/01/2013   CALCIUM 8.4 01/31/2013   CALCIUM 8.9 02/28/2012   LFT No results found for this basename: PROT, ALBUMIN, AST, ALT, ALKPHOS, BILITOT, BILIDIR, IBILI,  in the last 72 hours PT/INR Lab Results  Component Value Date   INR 0.99 05/16/2010   INR 0.94 01/08/2010   INR 1.00 12/24/2008   Drugs of Abuse     Component Value Date/Time   LABOPIA NONE DETECTED  05/20/2011 2219   [place] DETECTED 05/20/2011 2219   LABBENZ NONE DETECTED 05/20/2011 2219   AMPHETMU NONE DETECTED 05/20/2011 2219   THCU NONE DETECTED 05/20/2011 2219   LABBARB NONE DETECTED 05/20/2011 2219     RADIOLOGY STUDIES: Dg Chest Port 1 View  01/31/2013   CLINICAL DATA:  Chest pressure and short of breath  EXAM: PORTABLE CHEST - 1 VIEW  COMPARISON:  05/28/2012  FINDINGS: Mild cardiomegaly. Low volumes. Basilar atelectasis. Vascular congestion. No pneumothorax.  IMPRESSION: Mild cardiomegaly and vascular congestion with basilar atelectasis.   Electronically Signed   By: Maryclare Bean M.D.   On: 01/31/2013 13:02    ENDOSCOPIC STUDIES: 02/2004  Colonoscopy  Findings  STRICTURE / STENOSIS: Stenosis in Descending Colon. Seen  on withdrawal, after scope became temporarily fixed after reaching  the cecum. Required massage of ostomy and hernia to allow withdrawal of scope.  PRIOR SURGERY: Cecum. Comments: there appears to be an ileo-colonic  anastamosis with intact ileocecal valve.  - DIVERTICULOSIS: Cecum to Sigmoid Colon. ICD9: Diverticulosis, Colon:  562.10. Comments: scattered diverticula.  Comments:  IF HERNIA BECOMED PROBLEMATIC WOULD HAVE HER SEE A SURGEON NEED TO CONSIDER EGD, WILL DISCUSS WITH PATIENT. I WOULD NOT REPEAT A  COLONOSCPY IN HER GIVEN THE ENTRAPMENT ISSUE, WOULD DO CT  COLONOSCOPY OR OTHER IMAGING IN THE FUTURE AS FAR AS FOLLOW-UP FOR  REMOTE CANCER  03/2010 Barium Enema IMPRESSION:  The previously seen area of high stricture appears to be related to  compression from the anterior abdominal wall at the site the bowel  passes from the hernia sac into the abdominal cavity. This is  improved with superior pressure on the anterior abdominal wall.  03/2004  EGD Findings  Normal: Proximal Esophagus to Mid Esophagus.  - ESOPHAGEAL INFLAMMATION: suspected as a result of reflux. Severity  is severe, ulcerations present. Proximal margin 35 cm from mouth,  distal  margin 40 cm. Length of inflammation: 5 cm. Edema present.  [place] Classification: Grade C. ICD9: Esophagitis, Reflux: 530.  11.  - MUCOSAL ABNORMALITY: Fundus to Antrum. Erythematous mucosa. Red  spots present. ICD9: Gastritis, Unspecified: 535.50.  Normal: Duodenal Bulb to Duodenal 2nd Portion.  Comments:  I WOULD CONSIDER REPEAT EGD TO DOCUMENT HEALING    IMPRESSION:   *  Bleeding into ostomy. Started in small amount 3 days ago, vigorous bleeding with maroon stool noted this AM in pt who was receiving Heparin, this has been discontinued. Rule out recurrent esophagitis, ulcer, gastritis, AVMs 03/2004 EGD with esophageal inflammation.  This was supposed to have EGD follow up, but not clear it was ever performed.  She does not take PPI at home.  Started on bid IV protonix as of   *  ABL anemia.  2 units blood ordered. Receives Aranesp at HD  *  1986 colon cancer diagnosed.  S/p colostomy and urostomy  *  Abdominal hernia, preventing succesful colonoscopy in 2006.  Not felt to be safe to pursue colonoscopy.  Do not find results of a virtual colonoscopy, though it may have been performed.   *  Chest pressure.  Ruled in for non STEMI.  Heparin discontinued.  No recurrent chest pain.  *  ESRD.  For HD today.     PLAN:     *  Continue the Protonix and holding of heparin.  Plan EGD later today vs tomorrow.  If no plans to scope today she can have clears.  Orders for EGD placed in depot   [name]  02/01/2013, 11:10 AM Pager: [phone]  GI ATTENDING  History, laboratories, x-rays, prior endoscopy reports all personally reviewed. Patient personally seen and examined. Agree with H&P as outlined above. Patient with multiple significant medical problems as outlined. Presents with unstable angina and evidence for non-STEMI. Was placed on heparin. Began to have low volume maroon stools per ostomy. Heparin stopped. Modest drop in hemoglobin. Being transfused. Looks well and  physical exam. No complaints. Some maroonish stool. Prior EGD in 2006 with severe ulcerative esophagitis. Prior colonoscopy the stoma revealing colonic stricturing and diverticulosis. Followup barium enema (in lieu of colonoscopy due to anatomic problems) in 2011 for colonic neoplasia surveillance was negative except for the aforementioned findings at prior colonoscopy. Agree with treatment plan thus far. Continue PPI. We will plan diagnostic EGD tomorrow. The patient is high-risk given her comorbidities.The nature of the procedure, as well as the risks, benefits, and alternatives were carefully and thoroughly reviewed with the patient. Ample time for discussion and questions allowed. The patient understood, was satisfied, and agreed to proceed.Docia Chuck Geri [place]., M.D. [facility]

## 2013-02-01 NOTE — Progress Notes (Signed)
Pt transferred from 3W by wheelchair; pt alert and oriented; walks with walker; oriented to room and equipment; CHG wipes done; will continue to monitor

## 2013-02-01 NOTE — Progress Notes (Signed)
Subjective: No CP  Cold  Breathing is OK   Objective: Filed Vitals:   02/01/13 1038 02/01/13 1110 02/01/13 1135 02/01/13 1331  BP: 119/54 125/55 116/49 135/61  Pulse: 91 82 81 82  Temp:    98.1 F (36.7 C)  TempSrc:    Oral  Resp:  12 21 15   Height:      Weight:      SpO2:  100% 99% 97%   Weight change:   Intake/Output Summary (Last 24 hours) at 02/01/13 1415 Last data filed at 02/01/13 1300  Gross per 24 hour  Intake 277.53 ml  Output    350 ml  Net -72.47 ml    General: Alert, awake, oriented x3, in no acute distress Neck:  JVP is normal Heart: Regular rate and rhythm, without murmurs, rubs, gallops.  Lungs: Clear to auscultation.  No rales or wheezes. Exemities:  No edema.   Neuro: Grossly intact, nonfocal.  Tele:  SR  LBBB Lab Results: Results for orders placed during the hospital encounter of 01/31/13 (from the past 24 hour(s))  CBC     Status: Abnormal   Collection Time    01/31/13  2:52 PM      Result Value Range   WBC 7.6  4.0 - 10.5 K/uL   RBC 3.19 (*) 3.87 - 5.11 MIL/uL   Hemoglobin 9.4 (*) 12.0 - 15.0 g/dL   HCT 29.1 (*) 36.0 - 46.0 %   MCV 91.2  78.0 - 100.0 fL   MCH 29.5  26.0 - 34.0 pg   MCHC 32.3  30.0 - 36.0 g/dL   RDW 14.1  11.5 - 15.5 %   Platelets 163  150 - 400 K/uL  BASIC METABOLIC PANEL     Status: Abnormal   Collection Time    01/31/13  2:52 PM      Result Value Range   Sodium 144  137 - 147 mEq/L   Potassium 4.6  3.7 - 5.3 mEq/L   Chloride 100  96 - 112 mEq/L   CO2 25  19 - 32 mEq/L   Glucose, Bld 89  70 - 99 mg/dL   BUN 41 (*) 6 - 23 mg/dL   Creatinine, Ser 6.59 (*) 0.50 - 1.10 mg/dL   Calcium 8.4  8.4 - 10.5 mg/dL   GFR calc non Af Amer 6 (*) >90 mL/min   GFR calc Af Amer 7 (*) >90 mL/min  PRO B NATRIURETIC PEPTIDE     Status: Abnormal   Collection Time    01/31/13  2:52 PM      Result Value Range   Pro B Natriuretic peptide (BNP) 4117.0 (*) 0 - 125 pg/mL  TROPONIN I     Status: Abnormal   Collection Time    01/31/13  2:52  PM      Result Value Range   Troponin I 0.31 (*) <0.30 ng/mL  POCT I-STAT TROPONIN I     Status: Abnormal   Collection Time    01/31/13  3:05 PM      Result Value Range   Troponin i, poc 0.23 (*) 0.00 - 0.08 ng/mL   Comment NOTIFIED PHYSICIAN     Comment 3           POCT I-STAT TROPONIN I     Status: Abnormal   Collection Time    01/31/13  6:27 PM      Result Value Range   Troponin i, poc 0.19 (*) 0.00 - 0.08 ng/mL  Comment NOTIFIED PHYSICIAN     Comment 3           GLUCOSE, CAPILLARY     Status: Abnormal   Collection Time    01/31/13 10:04 PM      Result Value Range   Glucose-Capillary 136 (*) 70 - 99 mg/dL   Comment 1 Notify RN    TROPONIN I     Status: Abnormal   Collection Time    01/31/13 10:33 PM      Result Value Range   Troponin I 0.34 (*) <0.30 ng/mL  MRSA PCR SCREENING     Status: Abnormal   Collection Time    01/31/13 11:00 PM      Result Value Range   MRSA by PCR POSITIVE (*) NEGATIVE  BASIC METABOLIC PANEL     Status: Abnormal   Collection Time    02/01/13  4:56 AM      Result Value Range   Sodium 146  137 - 147 mEq/L   Potassium 4.9  3.7 - 5.3 mEq/L   Chloride 103  96 - 112 mEq/L   CO2 24  19 - 32 mEq/L   Glucose, Bld 106 (*) 70 - 99 mg/dL   BUN 48 (*) 6 - 23 mg/dL   Creatinine, Ser 7.63 (*) 0.50 - 1.10 mg/dL   Calcium 7.8 (*) 8.4 - 10.5 mg/dL   GFR calc non Af Amer 5 (*) >90 mL/min   GFR calc Af Amer 6 (*) >90 mL/min  TROPONIN I     Status: Abnormal   Collection Time    02/01/13  4:56 AM      Result Value Range   Troponin I 0.39 (*) <0.30 ng/mL  CBC     Status: Abnormal   Collection Time    02/01/13  4:56 AM      Result Value Range   WBC 7.9  4.0 - 10.5 K/uL   RBC 2.51 (*) 3.87 - 5.11 MIL/uL   Hemoglobin 7.4 (*) 12.0 - 15.0 g/dL   HCT 22.4 (*) 36.0 - 46.0 %   MCV 89.2  78.0 - 100.0 fL   MCH 29.5  26.0 - 34.0 pg   MCHC 33.0  30.0 - 36.0 g/dL   RDW 14.3  11.5 - 15.5 %   Platelets 193  150 - 400 K/uL  HEPARIN LEVEL (UNFRACTIONATED)      Status: Abnormal   Collection Time    02/01/13  4:56 AM      Result Value Range   Heparin Unfractionated 0.14 (*) 0.30 - 0.70 IU/mL  GLUCOSE, CAPILLARY     Status: Abnormal   Collection Time    02/01/13  7:46 AM      Result Value Range   Glucose-Capillary 101 (*) 70 - 99 mg/dL  TROPONIN I     Status: Abnormal   Collection Time    02/01/13 10:30 AM      Result Value Range   Troponin I 0.61 (*) <0.30 ng/mL  PREPARE RBC (CROSSMATCH)     Status: None   Collection Time    02/01/13 10:30 AM      Result Value Range   Order Confirmation ORDER PROCESSED BY BLOOD BANK    TYPE AND SCREEN     Status: None   Collection Time    02/01/13 10:30 AM      Result Value Range   ABO/RH(D) AB POS     Antibody Screen NEG     Sample Expiration 02/04/2013  Unit Number [id number]     Blood Component Type RED CELLS,LR     Unit division 00     Status of Unit ALLOCATED     Transfusion Status OK TO TRANSFUSE     Crossmatch Result Compatible     Unit Number [id number]     Blood Component Type RED CELLS,LR     Unit division 00     Status of Unit ALLOCATED     Transfusion Status OK TO TRANSFUSE     Crossmatch Result Compatible    PREPARE RBC (CROSSMATCH)     Status: None   Collection Time    02/01/13 12:27 PM      Result Value Range   Order Confirmation ORDER PROCESSED BY BLOOD BANK    GLUCOSE, CAPILLARY     Status: None   Collection Time    02/01/13  1:33 PM      Result Value Range   Glucose-Capillary 92  70 - 99 mg/dL    Studies/Results: @RISRSLT24 @  Medications: Reviewed   @PROBHOSP @  1.  CP  Patient currently denies  Patient with evidence of active bleeding through colostomy.  Hgb dropped to 7.4  Received blood.   Agree with stopping heparin  Follow CBC closely  There has been minimal trop bump   Pain may from strain with anemia.   I would not pursue further cardiacl  eval for now beyond echo    2. CAD  Patient with known CAD  Follow  3. Anemia  IM addressing  4.  LBBB   Old  5.  Atrial tachycardia  On Amio.   LOS: 1 day   Dorris Carnes 02/01/2013, 2:15 PM

## 2013-02-01 NOTE — Progress Notes (Signed)
Walden Field NP notified that behavior is escalating Pt will not allow administration of medications. Pt being aggressive Climbing OOB.

## 2013-02-01 NOTE — Progress Notes (Signed)
ANTICOAGULATION CONSULT NOTE - Follow Up Consult  Pharmacy Consult for Heparin  Indication: chest pain/ACS  Allergies  Allergen Reactions  . Ace Inhibitors Cough  . Eggs Or Egg-Derived Products Nausea And Vomiting  . Enalapril Cough  . Lisinopril Cough  . Omnipaque [Iohexol] Hives    Patient Measurements: Height: 5' (152.4 cm) Weight: 201 lb 4.5 oz (91.3 kg) IBW/kg (Calculated) : 45.5 Heparin Dosing Weight: ~67 kg  Vital Signs: Temp: 97.8 F (36.6 C) (01/23 2207) Temp src: Oral (01/23 2207) BP: 162/78 mmHg (01/23 2207) Pulse Rate: 96 (01/23 2207)  Labs:  Recent Labs  01/31/13 1452 01/31/13 2233 02/01/13 0456  HGB 9.4*  --  7.4*  HCT 29.1*  --  22.4*  PLT 163  --  193  HEPARINUNFRC  --   --  0.14*  CREATININE 6.59*  --   --   TROPONINI 0.31* 0.34*  --    Estimated Creatinine Clearance: 8.3 ml/min (by C-G formula based on Cr of 6.59).  Medications:  Heparin 950 units/hr  Assessment: 68 y/o F on heparin for +troponin. HL is 0.14. No issuers per Therapist, sports.   Goal of Therapy:  Heparin level 0.3-0.7 units/ml Monitor platelets by anticoagulation protocol: Yes   Plan:  -Increase heparin drip to 1100 units/hr -8 hour HL at 1430 -Daily CBC/HL -Monitor for bleeding  [name] 02/01/2013,5:48 AM

## 2013-02-01 NOTE — Procedures (Signed)
I was present at this session.  I have reviewed the session itself and made appropriate changes.  Hd via [place]. To transfuse, access press ok.  Jennel Mara L 1/24/20155:30 PM

## 2013-02-01 NOTE — Progress Notes (Signed)
Pt transported to dialysis; no complications noted

## 2013-02-01 NOTE — Consult Note (Signed)
[facility] Renal Consultation Note    Indication for Consultation:  Management of ESRD/hemodialysis; anemia, hypertension/volume and secondary hyperparathyroidism  HPI: [name] is a 68 y.o. female ESRD patient (MWF HD) with complex cardiac and medical history who presented to the ED with complaints of chest pressure that began around bedtime on Thursday, did not disrupt her sleep, but were still present when she woke up the next morning. She presented for her regular dialysis session on Friday but was referred instead for evaluation of her symptoms. Upon arrival, labs demonstrated mildly elevated troponin, which she has had in the past, and were initially felt due to renal disease. Cardiology was consulted once levels continued to rise and she was subsequently admitted for medical management. She is also noted to have small amounts of bright red blood in her colostomy (mixed with usual brown stool) for which GI has been consulted.  Her last dialysis session was on Wednesday and she is currently being treated outpatient for Klebsiella/MRSA urinary tract infection. At the time of this encounter, she is resting comfortably with no other emerging complaints.    Past Medical History  Diagnosis Date  . Hyperlipidemia   . Mitral valve insufficiency and aortic valve insufficiency   . Cardiomyopathy- mixed   . 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 Chronic diastolic heart failure   . Anemia   . Coronary artery disease     Previously decreased EF; echo 113 normal LV function  . Hernia, incisional     abd  . History of colon cancer     history of colon cancer.  1986  . Arthritis   . LBBB (left bundle branch block)   . Atrial tachycardia     on amiod  . Hypertensive heart disease     sees Dr. Alain Marion  . Stroke     2011/12  . Renal failure     ESRD, Dr Dunham/Dr. Lyda Kalata.  M, W, Fr  . Obesity    Past Surgical  History  Procedure Laterality Date  . Colostomy    . Revision urostomy cutaneous    . Esophagogastroduodenoscopy  03-18-04  . Electrocardiogram  04-27-06  . Placement of new left forearm arteriovenous graft  03-11-08  . Left heart catheterization and right heart catheterization  12-10    R. heart cath showed elevated left and right heart filling pressures w/ pulmonary artery pressure elevated mildly out of proportion to the wedge. The left heart cath showed diffuse distal vessel disease as well as a 75% stenosis in the mid circumflex w/ a 90% stenosis of the ostial first obtuse marginal. These lesions were in close proximity. there was a 60-70%mild RCA stenosis.   . Arteriovenous graft placement  2010  . 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 Colon surgery    . Cardiac catheterization    . Eye surgery      Cataract Left  . Pars plana vitrectomy  11/29/2011    Procedure: PARS PLANA VITRECTOMY WITH 23 GAUGE;  Surgeon: Adonis Brook, MD;  Location: [place];  Service: Ophthalmology;  Laterality: Right;  Right Eye 23 ga vitrectomy with membrane peel  . Pars plana vitrectomy Left 02/28/2012    Procedure: PARS PLANA VITRECTOMY WITH 23 GAUGE;  Surgeon: Adonis Brook, MD;  Location: [place];  Service: Ophthalmology;  Laterality: Left;   Family History  Problem Relation Age of Onset  .  Cancer Sister     colon  . Hypertension Other   . Hypertension Mother   . Coronary artery disease Mother   . Hypertension Father   . Diabetes Father    Social History:  reports that she has never smoked. She has never used smokeless tobacco. She reports that she does not drink alcohol or use illicit drugs. Allergies  Allergen Reactions  . Ace Inhibitors Cough  . Eggs Or Egg-Derived Products Nausea And Vomiting  . Enalapril Cough  . Lisinopril Cough  . Omnipaque [Iohexol] Hives   Prior to Admission medications   Medication Sig Start Date End Date Taking? Authorizing Provider   amiodarone (PACERONE) 200 MG tablet Take 100 mg by mouth daily.   Yes Historical Provider, MD  aspirin 81 MG chewable tablet Chew 81 mg by mouth daily.     Yes Historical Provider, MD  calcium acetate (PHOSLO) 667 MG capsule Take 2,001 mg by mouth 3 (three) times daily with meals. Take 2 capsules in the middle of each meal each day   Yes Historical Provider, MD  dorzolamide-timolol (COSOPT) 22.3-6.8 MG/ML ophthalmic solution Place 1 drop into both eyes 2 (two) times daily.  01/26/13  Yes Historical Provider, MD  Fluticasone-Salmeterol (ADVAIR) 250-50 MCG/DOSE AEPB Inhale 1 puff into the lungs daily as needed (shortness of breath).  05/28/12  Yes Evie Lacks Plotnikov, MD  insulin lispro protamine-lispro (HUMALOG 50/50) (50-50) 100 UNIT/ML SUSP Inject 20 Units into the skin daily with breakfast. 06/18/12  Yes Renato Shin, MD  isosorbide mononitrate (IMDUR) 60 MG 24 hr tablet Take 1 tablet (60 mg total) by mouth daily. 01/30/13  Yes Deboraha Sprang, MD  metoprolol (LOPRESSOR) 50 MG tablet Take 50 mg by mouth daily. Daily except on monday, Wednesday, and friday   Yes Historical Provider, MD  simvastatin (ZOCOR) 10 MG tablet Take 1 tablet (10 mg total) by mouth daily. 01/30/13  Yes Deboraha Sprang, MD   Current Facility-Administered Medications  Medication Dose Route Frequency Provider Last Rate Last Dose  . 0.9 %  sodium chloride infusion  100 mL Intravenous PRN Donetta Potts, MD      . 0.9 %  sodium chloride infusion  100 mL Intravenous PRN Donetta Potts, MD      . acetaminophen (TYLENOL) tablet 650 mg  650 mg Oral Q4H PRN Dawood Elgergawy, MD      . albuterol (PROVENTIL) (2.5 MG/3ML) 0.083% nebulizer solution 2.5 mg  2.5 mg Nebulization Q2H PRN Phillips Climes, MD      . amiodarone (PACERONE) tablet 100 mg  100 mg Oral Daily Phillips Climes, MD   100 mg at 02/01/13 1038  . calcium acetate (PHOSLO) capsule 2,001 mg  2,001 mg Oral TID WC Phillips Climes, MD   667 mg at 02/01/13 0853  .  dorzolamide-timolol (COSOPT) 22.3-6.8 MG/ML ophthalmic solution 1 drop  1 drop Both Eyes BID Phillips Climes, MD   1 drop at 01/31/13 2241  . feeding supplement (NEPRO CARB STEADY) liquid 237 mL  237 mL Oral PRN Donetta Potts, MD      . insulin aspart (novoLOG) injection 0-5 Units  0-5 Units Subcutaneous QHS Dawood Elgergawy, MD      . insulin aspart (novoLOG) injection 0-9 Units  0-9 Units Subcutaneous TID WC Dawood Elgergawy, MD      . lidocaine (PF) (XYLOCAINE) 1 % injection 5 mL  5 mL Intradermal PRN Donetta Potts, MD      . lidocaine-prilocaine (EMLA) cream 1 application  1 application Topical PRN Donetta Potts, MD      . metoprolol (LOPRESSOR) tablet 50 mg  50 mg Oral Q T,Th,S,Su Belkys A Regalado, MD      . mometasone-formoterol (DULERA) 100-5 MCG/ACT inhaler 2 puff  2 puff Inhalation BID Dawood Elgergawy, MD      . morphine 2 MG/ML injection 2 mg  2 mg Intravenous Q2H PRN Phillips Climes, MD      . nitroGLYCERIN (NITROSTAT) SL tablet 0.4 mg  0.4 mg Sublingual Q5 min PRN Phillips Climes, MD      . ondansetron (ZOFRAN) injection 4 mg  4 mg Intravenous Q6H PRN Dawood Elgergawy, MD      . pantoprazole (PROTONIX) injection 40 mg  40 mg Intravenous Q12H Belkys A Regalado, MD      . pentafluoroprop-tetrafluoroeth (GEBAUERS) aerosol 1 application  1 application Topical PRN Donetta Potts, MD      . simvastatin (ZOCOR) tablet 10 mg  10 mg Oral Daily Phillips Climes, MD   10 mg at 02/01/13 1038  . sodium chloride 0.9 % injection 3 mL  3 mL Intravenous Q12H Phillips Climes, MD   3 mL at 02/01/13 1039   Labs: Basic Metabolic Panel:  Recent Labs Lab 01/31/13 1452 02/01/13 0456  NA 144 146  K 4.6 4.9  CL 100 103  CO2 25 24  GLUCOSE 89 106*  BUN 41* 48*  CREATININE 6.59* 7.63*  CALCIUM 8.4 7.8*   CBC:  Recent Labs Lab 01/31/13 1452 02/01/13 0456  WBC 7.6 7.9  HGB 9.4* 7.4*  HCT 29.1* 22.4*  MCV 91.2 89.2  PLT 163 193   Cardiac Enzymes:  Recent  Labs Lab 01/31/13 1452 01/31/13 2233 02/01/13 0456  TROPONINI 0.31* 0.34* 0.39*   CBG:  Recent Labs Lab 01/31/13 2204 02/01/13 0746  GLUCAP 136* 101*   Studies/Results: Dg Chest Port 1 View  01/31/2013   CLINICAL DATA:  Chest pressure and short of breath  EXAM: PORTABLE CHEST - 1 VIEW  COMPARISON:  05/28/2012  FINDINGS: Mild cardiomegaly. Low volumes. Basilar atelectasis. Vascular congestion. No pneumothorax.  IMPRESSION: Mild cardiomegaly and vascular congestion with basilar atelectasis.   Electronically Signed   By: Maryclare Bean M.D.   On: 01/31/2013 13:02    ROS: Blood per colostomy. 10-pt ROS asked and answered. All other systems negative except as in HPI above.   Physical Exam: Filed Vitals:   02/01/13 1000 02/01/13 1038 02/01/13 1110 02/01/13 1135  BP: 120/51 119/54 125/55 116/49  Pulse: 92 91 82 81  Temp: 97.9 F (36.6 C)     TempSrc: Oral     Resp: 5  12 21   Height:      Weight:      SpO2: 97%  100% 99%     General: Elderly, pleasant, in no acute distress. Head: Normocephalic, atraumatic, sclera non-icteric, mucus membranes are moist Neck: Supple. JVD not elevated. Lungs: Clear bilaterally to auscultation without wheezes, rales, or rhonchi. Breathing is unlabored. Heart: RRR with S1 S2. No murmurs, rubs, or gallops appreciated. Abdomen: Obese, soft, non-tender, non-distended with normoactive bowel sounds. Colostomy with brown stool and trace blood. No rebound/guarding. No obvious abdominal masses. M-S:  Strength and tone appear normal for age. Lower extremities: Trace LE edema with dark/woody skin changes.  Neuro: Alert and oriented X 3. Moves all extremities spontaneously. Psych:  Responds to questions appropriately with a normal affect. Dialysis Access: R AVF +bruit  Dialysis Orders: Center: MWF on [place] . EDW 87.5 kg (might  need an increase per op notes) F160 HD Bath 2K/2Ca  Time 4:00 Heparin 3000 bolus/2000 mid  Access L AVF BFR 400 DFR A1.5   Hectorol 2 mcg  IV/HD Epogen 2000  Units IV/HD  Venofer  0 Recent labs: Hgb 9.3, Tsat 35%, P 5.7, PTH 187 in Oct  Assessment/Plan: 1. CAD/ NSTEMI - Mgmt per cards. Chest pain resolved with nitro. On home imdur, statin and BB. ASA and heparin on held due to active GIB per #2 below. 2. ABLA/ Anemia of CKD -  Hgb 7.4 < 9.4. Blood per colostomy. GI consulted, plans for PPI and EGD later today or tomorrow. Aranesp 100 ordered. Transfusing 2 units PRBCs with HD today. Last Tsat 35%. No IV Fe for now. Follow CBC. 3. UTI per outpatient labs - Cultures from 1/16 indicate Klebsiella (sensitive to [facility] - 2 doses remaining) and MRSA (sensitive to [facility] - 1 dose remaining.) Pharmacy consulted to continue therapy. 4. ESRD -  MWF. Missed HD yesterday. K+ 4.9 HD today. 5. Hypertension/volume  - SBPs 110-120s on metoprolol. Up about 4kg here by weights with trace edema. Op notes suggest she could likely use another incr in EDW. Try for 2-3L today. Eval post wgt and adjust EDW if needed. 6. Metabolic bone disease -  Ca 7.8. Phos borderline high op. May not need low Ca bath this admit. Check Corr Ca. PTH within range on Hectorol 2. Phoslo 2 ac once eating. 7. Nutrition - NPO for now. High prot renal diet when appropriate. Multivitamin. 8. Hx Atrial Tachycardia - On amiodarone 9. Hx colon CA  Sonnie Alamo, PA-C [facility] Pager [phone] 02/01/2013, 11:42 AM   I have seen and examined this patient and agree with plan as outlined by K. Cletus Gash, PA-C.  Pt with active bleeding and sig drop in H/H.  Will transfuse 2 units PRBC's while on HD. Shailey Butterbaugh A,MD 02/01/2013 8:35 PM

## 2013-02-01 NOTE — Progress Notes (Signed)
Walden Field NP made aware of AMS Pt uncooperative climbing OOB refusing medications

## 2013-02-01 NOTE — Progress Notes (Signed)
  Echocardiogram 2D Echocardiogram has been performed.  [name] 02/01/2013, 4:11 PM

## 2013-02-01 NOTE — Progress Notes (Signed)
[facility] PROGRESS NOTE  [name] RXV:[id number] DOB: January 21, 1945 DOA: 01/31/2013 PCP: Cynthia Kehr, MD  Assessment/Plan: 1-N-STEMI, angina; patient presents with chest pain. Troponin positive 0.39. Discontinue heparin Gtt in setting of active GI bleed. Hold aspirin also. Continue with simvastatin, metoprolol with holder parameters for hypotension. Cardiology informed of recent events. Patient chest pain free at this time. Hb at 7, transfuse one unit PRBC.   2-Gi bleed: patient with bright red blood colostomy bag. IV heparin stopped. Hb at 7. Will transfuse one units. Nephrologist informed. Patient will probably need transfusion during dialysis also. Change diet to NPO. Start IV protonix. Cycle HB every 8 hour. Transfer to step down unit for better monitor.   3-ESRD; nephrologist consulted. Last dialysis was last Wednesday.  4-Acute blood loss anemia: in setting of GI bleed. One unit of PRBC order.  5-Atrial Tachycardia; Continue with amiodarone.  6-Diabetes: SSI.    Code Status: Full Code.  Family Communication: care discussed with patient.  Disposition Plan: Transfer to step down for close monitoring.    Consultants:  GI, [place].   renal  Cardiology.   Procedures:  none  Antibiotics:  none  HPI/Subjective: Patient denies chest pain, dyspnea, abdominal pain. She relates that she had some bleeding on Wednesday , like tea spoon. On Thursday she had another episode, same amount. Yesterday she didn't notice any blood. She presents with chest pain, her troponin were positive. She was started on heparin Gtt.  Patient started to have bright blood in colostomy bag. Heparin gtt stopped.   Objective: Filed Vitals:   02/01/13 0610  BP: 143/71  Pulse: 94  Temp: 97.9 F (36.6 C)  Resp: 18    Intake/Output Summary (Last 24 hours) at 02/01/13 0821 Last data filed at 02/01/13 0500  Gross per 24 hour  Intake 277.53 ml  Output      0 ml  Net 277.53 ml   Filed  Weights   01/31/13 1219 01/31/13 2207  Weight: 89.812 kg (198 lb) 91.3 kg (201 lb 4.5 oz)    Exam:   General:  No distress.   Cardiovascular: S 1, S 2 RRR  Respiratory: CTA  Abdomen: Bs present, soft, NT, colostomy in place, bright blood on bag.   Musculoskeletal: no edema.   Data Reviewed: Basic Metabolic Panel:  Recent Labs Lab 01/31/13 1452 02/01/13 0456  NA 144 146  K 4.6 4.9  CL 100 103  CO2 25 24  GLUCOSE 89 106*  BUN 41* 48*  CREATININE 6.59* 7.63*  CALCIUM 8.4 7.8*   Liver Function Tests: No results found for this basename: AST, ALT, ALKPHOS, BILITOT, PROT, ALBUMIN,  in the last 168 hours No results found for this basename: LIPASE, AMYLASE,  in the last 168 hours No results found for this basename: AMMONIA,  in the last 168 hours CBC:  Recent Labs Lab 01/31/13 1452 02/01/13 0456  WBC 7.6 7.9  HGB 9.4* 7.4*  HCT 29.1* 22.4*  MCV 91.2 89.2  PLT 163 193   Cardiac Enzymes:  Recent Labs Lab 01/31/13 1452 01/31/13 2233 02/01/13 0456  TROPONINI 0.31* 0.34* 0.39*   BNP (last 3 results)  Recent Labs  01/31/13 1452  PROBNP 4117.0*   CBG:  Recent Labs Lab 01/31/13 2204 02/01/13 0746  GLUCAP 136* 101*    Recent Results (from the past 240 hour(s))  MRSA PCR SCREENING     Status: Abnormal   Collection Time    01/31/13 11:00 PM      Result Value  Range Status   MRSA by PCR POSITIVE (*) NEGATIVE Final   Comment:            The GeneXpert MRSA Assay (FDA     approved for NASAL specimens     only), is one component of a     comprehensive MRSA colonization     surveillance program. It is not     intended to diagnose MRSA     infection nor to guide or     monitor treatment for     MRSA infections.     RESULT CALLED TO, READ BACK BY AND VERIFIED WITH:     Clent Ridges [phone] [facility]     Studies: Dg Chest Port 1 View  01/31/2013   CLINICAL DATA:  Chest pressure and short of breath  EXAM: PORTABLE CHEST - 1 VIEW  COMPARISON:   05/28/2012  FINDINGS: Mild cardiomegaly. Low volumes. Basilar atelectasis. Vascular congestion. No pneumothorax.  IMPRESSION: Mild cardiomegaly and vascular congestion with basilar atelectasis.   Electronically Signed   By: Maryclare Bean M.D.   On: 01/31/2013 13:02    Scheduled Meds: . amiodarone  100 mg Oral Daily  . aspirin  81 mg Oral Daily  . calcium acetate  2,001 mg Oral TID WC  . dorzolamide-timolol  1 drop Both Eyes BID  . insulin aspart  0-5 Units Subcutaneous QHS  . insulin aspart  0-9 Units Subcutaneous TID WC  . isosorbide mononitrate  60 mg Oral Daily  . metoprolol  50 mg Oral Q T,Th,S,Su  . mometasone-formoterol  2 puff Inhalation BID  . simvastatin  10 mg Oral Daily  . sodium chloride  3 mL Intravenous Q12H   Continuous Infusions: . heparin 1,100 Units/hr (02/01/13 3428)    Principal Problem:   NSTEMI (non-ST elevated myocardial infarction) Active Problems:   Hypertensive heart disease   LBBB   Coronary artery disease   ESRD on dialysis   Type I (juvenile type) diabetes mellitus with renal manifestations, not stated as uncontrolled   S/P transmetatarsal amputation of foot    Time spent: 35 minutes.     [name]  [facility] Pager [phone]. If 7PM-7AM, please contact night-coverage at [email or web address], password [email or web address] 02/01/2013, 8:21 AM  LOS: 1 day

## 2013-02-01 NOTE — Progress Notes (Addendum)
ANTIBIOTIC CONSULT NOTE - INITIAL  Pharmacy Consult for vanc and ceftazidime  Indication: MRSA and Klebsiella pneumniae UTI (outpatient cultures)  Allergies   Allergen  Reactions   .  Ace Inhibitors  Cough   .  Eggs Or Egg-Derived Products  Nausea And Vomiting   .  Enalapril  Cough   .  Lisinopril  Cough   .  Omnipaque [Iohexol]  Hives    Patient Measurements:  Height: 5' (152.4 cm)  Weight: 201 lb 4.5 oz (91.3 kg)  IBW/kg (Calculated) : 45.5  Vital Signs:  Temp: 97.9 F (36.6 C) (01/24 1000)  Temp src: Oral (01/24 1000)  BP: 116/49 mmHg (01/24 1135)  Pulse Rate: 81 (01/24 1135)  Intake/Output from previous day:  01/23 0701 - 01/24 0700  In: 277.5 [P.O.:240; I.V.:37.5]  Out: -  Intake/Output from this shift:   Labs:   Recent Labs   01/31/13 1452  02/01/13 0456   WBC  7.6  7.9   HGB  9.4*  7.4*   PLT  163  193   CREATININE  6.59*  7.63*    Estimated Creatinine Clearance: 7.2 ml/min (by C-G formula based on Cr of 7.63).  No results found for this basename: VANCOTROUGH, Corlis Leak, VANCORANDOM, [place], GENTPEAK, GENTRANDOM, TOBRATROUGH, TOBRAPEAK, TOBRARND, AMIKACINPEAK, AMIKACINTROU, AMIKACIN, in the last 72 hours  Microbiology:  Recent Results (from the past 720 hour(s))   MRSA PCR SCREENING Status: Abnormal    Collection Time    01/31/13 11:00 PM   Result  Value  Range  Status    MRSA by PCR  POSITIVE (*)  NEGATIVE  Final    Comment:      The GeneXpert MRSA Assay (FDA     approved for NASAL specimens     only), is one component of a     comprehensive MRSA colonization     surveillance program. It is not     intended to diagnose MRSA     infection nor to guide or     monitor treatment for     MRSA infections.     RESULT CALLED TO, READ BACK BY AND VERIFIED WITH:     Clent Ridges [phone] [facility]    Medical History:  Past Medical History   Diagnosis  Date   .  Hyperlipidemia    .  Mitral valve insufficiency and aortic valve insufficiency    .   Cardiomyopathy- mixed    .  Type II diabetes mellitus without mention of complication, not stated as uncontrolled    .  Chronic diastolic heart failure    .  Anemia    .  Coronary artery disease      Previously decreased EF; echo 113 normal LV function   .  Hernia, incisional      abd   .  History of colon cancer      history of colon cancer. 1986   .  Arthritis    .  LBBB (left bundle branch block)    .  Atrial tachycardia      on amiod   .  Hypertensive heart disease      sees Dr. Alain Marion   .  Stroke      2011/12   .  Renal failure      ESRD, Dr Dunham/Dr. Lyda Kalata. M, W, Fr   .  Obesity     Medications:  Scheduled:  .  amiodarone  100 mg  Oral  Daily   .  calcium acetate  2,001 mg  Oral  TID WC   .  darbepoetin (ARANESP) injection - DIALYSIS  100 mcg  Intravenous  Q Sat-HD   .  dorzolamide-timolol  1 drop  Both Eyes  BID   .  doxercalciferol  2 mcg  Intravenous  Once in dialysis   .  [START ON 02/03/2013] doxercalciferol  2 mcg  Intravenous  Q M,W,F-HD   .  insulin aspart  0-5 Units  Subcutaneous  QHS   .  insulin aspart  0-9 Units  Subcutaneous  TID WC   .  metoprolol  50 mg  Oral  Q T,Th,S,Su   .  mometasone-formoterol  2 puff  Inhalation  BID   .  pantoprazole (PROTONIX) IV  40 mg  Intravenous  Q12H   .  simvastatin  10 mg  Oral  Daily   .  sodium chloride  3 mL  Intravenous  Q12H    Assessment:  68 yo F with ESRD-HD (MWF) experienced CP prior to starting Friday's dialysis. Patient was sent to ER, not receiving HD that day. Pharmacy has been consulted to continue antibiotics for outpatient cultures revealing UTI with MRSA and Klebsiella. Per consult, patient needs one more dose of vancomycin and two more doses of ceftazidime to complete therapy course. Her HD is currently planned for today and on Monday. Vancomycin random level = 6 prior to HD.   Goal of Therapy:  Target pre-HD vancomycin level 15-25  Resolution of infection   Plan:  - Vancomycin 1000g post HD  today x1 dose per protocol - give ceftazidime IV 2gm after HD today and then on Monday   Sloan Leiter, PharmD, BCPS Clinical Pharmacist [phone] 02/01/2013, 5:50 PM   Addendum ================================== Patient refused ceftaz dose last night 1/24 after HD.  Per nurse, patient more receptive today.  Scheduling a one-time dose today, then continuing with scheduled dose on 1/26 after HD.  Renelda Mom Jacqlyn Larsen, PharmD Clinical Pharmacist - Resident Pager: [phone] Pharmacy: [phone] 02/02/2013 4:10 PM

## 2013-02-02 ENCOUNTER — Encounter (HOSPITAL_COMMUNITY): Payer: Self-pay | Admitting: *Deleted

## 2013-02-02 ENCOUNTER — Encounter (HOSPITAL_COMMUNITY)
Admission: EM | Disposition: A | Discharge status: Home / Self Care | Payer: Self-pay | Source: Home / Self Care | Attending: Internal Medicine

## 2013-02-02 DIAGNOSIS — E119 Type 2 diabetes mellitus without complications: Secondary | ICD-10-CM | POA: Diagnosis not present

## 2013-02-02 DIAGNOSIS — K922 Gastrointestinal hemorrhage, unspecified: Secondary | ICD-10-CM | POA: Diagnosis not present

## 2013-02-02 DIAGNOSIS — D649 Anemia, unspecified: Secondary | ICD-10-CM | POA: Diagnosis not present

## 2013-02-02 DIAGNOSIS — R079 Chest pain, unspecified: Secondary | ICD-10-CM | POA: Diagnosis not present

## 2013-02-02 DIAGNOSIS — I2510 Atherosclerotic heart disease of native coronary artery without angina pectoris: Secondary | ICD-10-CM

## 2013-02-02 DIAGNOSIS — I214 Non-ST elevation (NSTEMI) myocardial infarction: Secondary | ICD-10-CM

## 2013-02-02 DIAGNOSIS — I119 Hypertensive heart disease without heart failure: Secondary | ICD-10-CM | POA: Diagnosis not present

## 2013-02-02 DIAGNOSIS — G9340 Encephalopathy, unspecified: Secondary | ICD-10-CM

## 2013-02-02 DIAGNOSIS — N390 Urinary tract infection, site not specified: Secondary | ICD-10-CM | POA: Diagnosis not present

## 2013-02-02 DIAGNOSIS — K219 Gastro-esophageal reflux disease without esophagitis: Secondary | ICD-10-CM | POA: Diagnosis not present

## 2013-02-02 DIAGNOSIS — D631 Anemia in chronic kidney disease: Secondary | ICD-10-CM | POA: Diagnosis not present

## 2013-02-02 DIAGNOSIS — N186 End stage renal disease: Secondary | ICD-10-CM | POA: Diagnosis not present

## 2013-02-02 HISTORY — PX: ESOPHAGOGASTRODUODENOSCOPY: SHX5428

## 2013-02-02 LAB — BASIC METABOLIC PANEL
BUN: 22 mg/dL (ref 6–23)
CO2: 25 mEq/L (ref 19–32)
Calcium: 8.1 mg/dL — ABNORMAL LOW (ref 8.4–10.5)
Chloride: 96 mEq/L (ref 96–112)
Creatinine, Ser: 4.93 mg/dL — ABNORMAL HIGH (ref 0.50–1.10)
GFR calc non Af Amer: 8 mL/min — ABNORMAL LOW (ref >90)
GFR, EST AFRICAN AMERICAN: 10 mL/min — AB (ref >90)
Glucose, Bld: 82 mg/dL (ref 70–99)
Potassium: 4.1 mEq/L (ref 3.7–5.3)
Sodium: 137 mEq/L (ref 137–147)

## 2013-02-02 LAB — CBC
HCT: 29.8 % — ABNORMAL LOW (ref 36.0–46.0)
HEMATOCRIT: 29.2 % — AB (ref 36.0–46.0)
Hemoglobin: 9.9 g/dL — ABNORMAL LOW (ref 12.0–15.0)
Hemoglobin: 10.2 g/dL — ABNORMAL LOW (ref 12.0–15.0)
MCH: 29.9 pg (ref 26.0–34.0)
MCH: 30.4 pg (ref 26.0–34.0)
MCHC: 33.9 g/dL (ref 30.0–36.0)
MCHC: 34.2 g/dL (ref 30.0–36.0)
MCV: 88.2 fL (ref 78.0–100.0)
MCV: 89 fL (ref 78.0–100.0)
PLATELETS: 168 10*3/uL (ref 150–400)
PLATELETS: 174 10*3/uL (ref 150–400)
RBC: 3.31 MIL/uL — ABNORMAL LOW (ref 3.87–5.11)
RBC: 3.35 MIL/uL — AB (ref 3.87–5.11)
RDW: 14.2 % (ref 11.5–15.5)
RDW: 14.3 % (ref 11.5–15.5)
WBC: 6.8 10*3/uL (ref 4.0–10.5)
WBC: 6.2 10*3/uL (ref 4.0–10.5)

## 2013-02-02 LAB — TYPE AND SCREEN
ABO/RH(D): AB POS
ANTIBODY SCREEN: NEGATIVE
UNIT DIVISION: 0
Unit division: 0

## 2013-02-02 LAB — RENAL FUNCTION PANEL
Albumin: 2.6 g/dL — ABNORMAL LOW (ref 3.5–5.2)
BUN: 17 mg/dL (ref 6–23)
CHLORIDE: 97 meq/L (ref 96–112)
CO2: 28 meq/L (ref 19–32)
Calcium: 7.9 mg/dL — ABNORMAL LOW (ref 8.4–10.5)
Creatinine, Ser: 4.18 mg/dL — ABNORMAL HIGH (ref 0.50–1.10)
GFR calc non Af Amer: 10 mL/min — ABNORMAL LOW (ref >90)
GFR, EST AFRICAN AMERICAN: 12 mL/min — AB (ref >90)
Glucose, Bld: 92 mg/dL (ref 70–99)
Phosphorus: 3.8 mg/dL (ref 2.3–4.6)
Potassium: 3.8 mEq/L (ref 3.7–5.3)
SODIUM: 138 meq/L (ref 137–147)

## 2013-02-02 LAB — HEPATIC FUNCTION PANEL
ALT: 6 U/L (ref 0–35)
AST: 16 U/L (ref 0–37)
Albumin: 2.6 g/dL — ABNORMAL LOW (ref 3.5–5.2)
Alkaline Phosphatase: 92 U/L (ref 39–117)
Bilirubin, Direct: <0.2 mg/dL (ref 0.0–0.3)
TOTAL PROTEIN: 6.5 g/dL (ref 6.0–8.3)
Total Bilirubin: 0.3 mg/dL (ref 0.3–1.2)

## 2013-02-02 LAB — AMMONIA: Ammonia: 30 umol/L (ref 11–60)

## 2013-02-02 LAB — HEMOGLOBIN AND HEMATOCRIT, BLOOD
HEMATOCRIT: 30.7 % — AB (ref 36.0–46.0)
HEMOGLOBIN: 10.2 g/dL — AB (ref 12.0–15.0)

## 2013-02-02 LAB — GLUCOSE, CAPILLARY
Glucose-Capillary: 89 mg/dL (ref 70–99)
Glucose-Capillary: 91 mg/dL (ref 70–99)
Glucose-Capillary: 125 mg/dL — ABNORMAL HIGH (ref 70–99)

## 2013-02-02 SURGERY — EGD (ESOPHAGOGASTRODUODENOSCOPY)
Anesthesia: Moderate Sedation

## 2013-02-02 MED ORDER — DIPHENHYDRAMINE HCL 50 MG/ML IJ SOLN
INTRAMUSCULAR | Status: AC
  Filled 2013-02-02: qty 1

## 2013-02-02 MED ORDER — SODIUM CHLORIDE 0.9 % IV SOLN: INTRAVENOUS | Status: DC

## 2013-02-02 MED ORDER — MIDAZOLAM HCL 5 MG/ML IJ SOLN
INTRAMUSCULAR | Status: AC
  Filled 2013-02-02: qty 2

## 2013-02-02 MED ORDER — FENTANYL CITRATE 0.05 MG/ML IJ SOLN
INTRAMUSCULAR | Status: DC | PRN
  Administered 2013-02-02: 25 ug via INTRAVENOUS

## 2013-02-02 MED ORDER — FENTANYL CITRATE 0.05 MG/ML IJ SOLN
INTRAMUSCULAR | Status: AC
  Filled 2013-02-02: qty 2

## 2013-02-02 MED ORDER — MIDAZOLAM HCL 10 MG/2ML IJ SOLN
INTRAMUSCULAR | Status: DC | PRN
  Administered 2013-02-02: 2 mg via INTRAVENOUS
  Administered 2013-02-02: 1 mg via INTRAVENOUS
  Administered 2013-02-02: 2 mg via INTRAVENOUS

## 2013-02-02 MED ORDER — BUTAMBEN-TETRACAINE-BENZOCAINE 2-2-14 % EX AERO
INHALATION_SPRAY | CUTANEOUS | Status: DC | PRN
  Administered 2013-02-02: 2 via TOPICAL

## 2013-02-02 MED ORDER — DEXTROSE 5 % IV SOLN
2.0000 g | Freq: Once | INTRAVENOUS | Status: AC
  Administered 2013-02-02: 2 g via INTRAVENOUS
  Filled 2013-02-02: qty 2

## 2013-02-02 NOTE — Progress Notes (Signed)
SUBJECTIVE:  No further chest pressure  OBJECTIVE:   Vitals:   Filed Vitals:   02/02/13 0110 02/02/13 0245 02/02/13 0455 02/02/13 0832  BP: 124/44 151/60 141/60 110/46  Pulse: 102 96 85 89  Temp:   97.4 F (36.3 C) 98.1 F (36.7 C)  TempSrc:   Oral Oral  Resp: 20 19 22 19   Height:      Weight:      SpO2: 95% 95% 95% 96%   I&O's:   Intake/Output Summary (Last 24 hours) at 02/02/13 3716 Last data filed at 02/02/13 0455  Gross per 24 hour  Intake    653 ml  Output   2650 ml  Net  -1997 ml   TELEMETRY: Reviewed telemetry pt in NSR:     PHYSICAL EXAM General: Well developed, well nourished, in no acute distress Head: Eyes PERRLA, No xanthomas.   Normal cephalic and atramatic  Lungs:   Clear bilaterally to auscultation and percussion. Heart:   HRRR S1 S2 Pulses are 2+ & equal. Abdomen: Bowel sounds are positive, abdomen soft and non-tender without masses  Extremities:   No clubbing, cyanosis or edema.  DP +1 Neuro: Alert and oriented X 3. Psych:  Good affect, responds appropriately   LABS: Basic Metabolic Panel:  Recent Labs  01/31/13 1452 02/01/13 0456  NA 144 146  K 4.6 4.9  CL 100 103  CO2 25 24  GLUCOSE 89 106*  BUN 41* 48*  CREATININE 6.59* 7.63*  CALCIUM 8.4 7.8*   Liver Function Tests: No results found for this basename: AST, ALT, ALKPHOS, BILITOT, PROT, ALBUMIN,  in the last 72 hours No results found for this basename: LIPASE, AMYLASE,  in the last 72 hours CBC:  Recent Labs  02/01/13 0456 02/01/13 1640  WBC 7.9 7.9  HGB 7.4* 8.0*  HCT 22.4* 24.0*  MCV 89.2 89.6  PLT 193 185   Cardiac Enzymes:  Recent Labs  01/31/13 2233 02/01/13 0456 02/01/13 1030  TROPONINI 0.34* 0.39* 0.61*   BNP: No components found with this basename: POCBNP,  D-Dimer: No results found for this basename: DDIMER,  in the last 72 hours Hemoglobin A1C: No results found for this basename: HGBA1C,  in the last 72 hours Fasting Lipid Panel: No results found  for this basename: CHOL, HDL, LDLCALC, TRIG, CHOLHDL, LDLDIRECT,  in the last 72 hours Thyroid Function Tests: No results found for this basename: TSH, T4TOTAL, FREET3, T3FREE, THYROIDAB,  in the last 72 hours Anemia Panel: No results found for this basename: VITAMINB12, FOLATE, FERRITIN, TIBC, IRON, RETICCTPCT,  in the last 72 hours Coag Panel:   Lab Results  Component Value Date   INR 0.99 05/16/2010   INR 0.94 01/08/2010   INR 1.00 12/24/2008    RADIOLOGY: Dg Chest Port 1 View  01/31/2013   CLINICAL DATA:  Chest pressure and short of breath  EXAM: PORTABLE CHEST - 1 VIEW  COMPARISON:  05/28/2012  FINDINGS: Mild cardiomegaly. Low volumes. Basilar atelectasis. Vascular congestion. No pneumothorax.  IMPRESSION: Mild cardiomegaly and vascular congestion with basilar atelectasis.   Electronically Signed   By: Maryclare Bean M.D.   On: 01/31/2013 13:02   68 y/o AAF, with HTN, HLD, DM and ESRD presents w/ symptoms concerning for U/A. Initial troponin + at 0.31. Assessment/Plan:  1. Prolonged chest discomfort with mild elevated troponins consistent with a non-ST elevation myocardial infarction .  2D echo with normal LVF EF 55-60% so most likely this is demand ischemia from GI bleed 2. Hypertensive  heart disease - BP controlled 3. Coronary artery disease  4. Diabetes mellitus with renal complications  5. End stage renal disease on hemodialysis  6. Anemia with active GI bleeding via colostomy - Hbg dropped to 7.4 yesterday and received blood 7. Atrial tachycardia on Amio 8.  Chronic LBBB  Plan: Continue to cycle enzymes until they peak - ? Secondary to demand ischemia from GI bleed IV heparin/ASA stopped due to GI bleeding Continue BB, and statin, Amiodarone.  Nitrates on hold due to borderline BP and active Gi bleeding      Sueanne Margarita, MD  02/02/2013  8:37 AM

## 2013-02-02 NOTE — Progress Notes (Signed)
Patient ID: AITANA BURRY, female   DOB: 1945-03-06, 68 y.o.   MRN: [id number]  [facility] Progress Note    Subjective:   No new complaints   Objective:   BP 110/46  Pulse 89  Temp(Src) 98.1 F (36.7 C) (Oral)  Resp 19  Ht 5' (1.524 m)  Wt 90.6 kg (199 lb 11.8 oz)  BMI 39.01 kg/m2  SpO2 96%  Intake/Output: I/O last 3 completed shifts: In: 940.8 [P.O.:240; I.V.:50.8; Blood:650] Out: 2650 [Other:2650]   Intake/Output this shift:    Weight change: 3.188 kg (7 lb 0.4 oz)  Physical Exam: Gen:WD WN AAF in NAD CVS:no rub Resp:cta ZJI:RCVELF with some maroon stool Ext:no edema, LUE AVG +T/B  Labs: BMET  Recent Labs Lab 01/31/13 1452 02/01/13 0456 02/02/13 0815  NA 144 146 138  K 4.6 4.9 3.8  CL 100 103 97  CO2 25 24 28   GLUCOSE 89 106* 92  BUN 41* 48* 17  CREATININE 6.59* 7.63* 4.18*  ALBUMIN  --   --  2.6*  2.6*  CALCIUM 8.4 7.8* 7.9*  PHOS  --   --  3.8   CBC  Recent Labs Lab 01/31/13 1452 02/01/13 0456 02/01/13 1640 02/02/13 0815  WBC 7.6 7.9 7.9 6.8  HGB 9.4* 7.4* 8.0* 9.9*  HCT 29.1* 22.4* 24.0* 29.2*  MCV 91.2 89.2 89.6 88.2  PLT 163 193 185 168    @IMGRELPRIORS @ Medications:    . amiodarone  100 mg Oral Daily  . calcium acetate  2,001 mg Oral TID WC  . cefTAZidime (FORTAZ)  IV  2 g Intravenous Q48H  . darbepoetin (ARANESP) injection - DIALYSIS  100 mcg Intravenous Q Sat-HD  . dorzolamide-timolol  1 drop Both Eyes BID  . doxercalciferol  2 mcg Intravenous Once in dialysis  . [START ON 02/03/2013] doxercalciferol  2 mcg Intravenous Q M,W,F-HD  . insulin aspart  0-5 Units Subcutaneous QHS  . insulin aspart  0-9 Units Subcutaneous TID WC  . metoprolol  50 mg Oral Q T,Th,S,Su  . mometasone-formoterol  2 puff Inhalation BID  . multivitamin  1 tablet Oral QHS  . pantoprazole (PROTONIX) IV  40 mg Intravenous Q12H  . simvastatin  10 mg Oral Daily  . sodium chloride  3 mL Intravenous Q12H     Dialysis Orders: Center: MWF  on [place] .  EDW 87.5 kg (might need an increase per op notes) F160 HD Bath 2K/2Ca Time 4:00 Heparin 3000 bolus/2000 mid  Access L AVF BFR 400 DFR A1.5  Hectorol 2 mcg IV/HD Epogen 2000 Units IV/HD Venofer 0  Recent labs: Hgb 9.3, Tsat 35%, P 5.7, PTH 187 in Oct   Assessment/Plan:  1. CAD/ NSTEMI - Mgmt per cards. Chest pain resolved with nitro. On home imdur, statin and BB. ASA and heparin on held due to active GIB per #2 below. 2. ABLA/ Anemia of CKD - Hgb 7.4 up to 9.9 following blood transfusion.  1. Blood per colostomy. GI consulted, plans for PPI and EGD later today or tomorrow. Aranesp 100 ordered. Transfusing 2 units PRBCs with HD today. Last Tsat 35%. No IV Fe for now. Follow CBC. 3. UTI per outpatient labs - Cultures from 1/16 indicate Klebsiella (sensitive to [facility] - 2 doses remaining) and MRSA (sensitive to Vanc - 1 dose remaining.) Pharmacy consulted to continue therapy. 4. ESRD - MWF. Will get back on schedule tomorrow. 5. Hypertension/volume - SBPs 110-120s on metoprolol. Up about 4kg here by weights with trace edema. Op  notes suggest she could likely use another incr in EDW. Try for 2-3L today. Eval post wgt and adjust EDW if needed. 6. Metabolic bone disease - Ca 7.9. Phos borderline high op. May not need low Ca bath this admit. Check Corr Ca. PTH within range on Hectorol 2. Phoslo 2 ac once eating. 7. Nutrition - NPO for now. High prot renal diet when appropriate. Multivitamin. 8. Hx Atrial Tachycardia - On amiodarone 9. Hx colon CA 10.  Trameka Dorough A 02/02/2013, 10:33 AM

## 2013-02-02 NOTE — Progress Notes (Signed)
Called pt's daughter [name] and left a message on her answering machine.

## 2013-02-02 NOTE — Progress Notes (Signed)
Received return call from pt's daughter. Explained to her the change in her mother' behavior. The pt is confused and very aggitated refusing [place] to render care and administer her medications. [name] stated that on a few occassions she has noticed a change at night.  Security at bedside. Haldol 0.25mg  administered IV x2.

## 2013-02-02 NOTE — Op Note (Signed)
[facility] [place], [id number]   ENDOSCOPY PROCEDURE REPORT  PATIENT: Cynthia, Walls  MR#: [id number] BIRTHDATE: [birth year] , 67  yrs. old GENDER: Female ENDOSCOPIST: Eustace Quail, MD REFERRED BY:  [facility] PROCEDURE DATE:  02/02/2013 PROCEDURE:  EGD, diagnostic ASA CLASS:     Class III INDICATIONS:  Melena. MEDICATIONS: Fentanyl 50 mcg IV and Versed 5 mg IV TOPICAL ANESTHETIC: Cetacaine Spray  DESCRIPTION OF PROCEDURE: After the risks benefits and alternatives of the procedure were thoroughly explained, informed consent was obtained.  The Pentax Gastroscope [id number] endoscope was introduced through the mouth and advanced to the third portion of the duodenum. Without limitations.  The instrument was slowly withdrawn as the mucosa was fully examined.      EXAM:The esophagus revealed erosive changes and stricture in the distal most portion.  The stomach revealed multiple antral erosions.  The duodenal bulb and post bulbar duodenum to the third portion were normal.  No active bleeding.  Retroflexed views revealed a hiatal hernia.     The scope was then withdrawn from the patient and the procedure completed.  COMPLICATIONS: There were no complications. ENDOSCOPIC IMPRESSION: 1. Erosive esophagitis 2. Antral erosions 3. GI bleeding likely secondary to the above findings in the face of anticoagulation.  RECOMMENDATIONS: 1.Continue PPI twice daily for 4 weeks then once daily indefinitely 2. Monitor blood counts and transfuse if needed 3. GI followup as needed. Will sign off  REPEAT EXAM:  eSigned:  Eustace Quail, MD 02/02/2013 1:04 PM   VV:OHYW Carlean Purl, MD and The Patient

## 2013-02-02 NOTE — H&P (View-Only) (Signed)
[facility] Consult: 11:10 AM 02/01/2013  LOS: 1 day    Referring Provider: Dr Tyrell Antonio  Primary Care Physician:  Walker Kehr, MD Primary Gastroenterologist:  Dr. Carlean Purl    Reason for Consultation:  Blood seen in ostomy   HPI: [name] is a 68 y.o. female.  Hc CAD, Hx of cardiomyopathy but latest EF 55 to 65%.  Non stemi in 2010. 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  On amiodarone for atrial tachycardia.  Colon cancer in 1986.  S/p 1986 colostomy and urostomy .  March 2012 referred to Dr Barry Dienes for the herniated bowel but no surgery was performed.   Admitted yesterday with chest pain, relieved with NTG.  No nausea, diaphoresis. Mild increase Troponins, ruled in for non STEMI.  Started on heparin.  This morning red blood noted in ostomy bag.  Hgb is 7.4 today, from 9.4 yesterday.  Was 10.2 in 02/2012.  2 units of blood ordered to be given during HD.   On Wednesday and Thursday, she saw red blood leach out of the colostomy contents after emptying bag into commode.  Stool itself was brown and loose as usual.   She had ss chest pressure Thursday night that passed.  It recurred as she arrived at HD Friday AM.  Her HD session was cancelled and she was sent for admission to hospital.  Chest pain relieved by SL NTG.  No chest pain today.   Not on PPI or H2 blocker at home.  Rare episodes of heartburn specific to culprit foods.  Takes non-specified med for this when needed.  No recent increase in heartburn.  No nausea.  Appetite stable but overall less than it was several years ago.  Weight stable for last few years. No SOB, no palpitation, no dysphagia, no increase in stool output. No NSAIDs but takes ASA daily and given 4 ASA by EMS yesterday.   Receives epo/procrit at dialysis.  Transfusions of blood many years ago.   Treated with parenteral iron before she started HD.        Past Medical History  Diagnosis Date  . Hyperlipidemia   . Mitral valve insufficiency and aortic valve insufficiency   . Cardiomyopathy- mixed   . 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 Chronic diastolic heart failure   . Anemia   . Coronary artery disease     Previously decreased EF; echo 113 normal LV function  . Hernia, incisional     abd  . History of colon cancer     history of colon cancer.  1986  . Arthritis   . LBBB (left bundle branch block)   . Atrial tachycardia     on amiod  . Hypertensive heart disease     sees Dr. Alain Marion  . Stroke     2011/12  . Renal failure     ESRD, Dr Dunham/Dr. Lyda Kalata.  M, W, Fr  . Obesity     Past Surgical History  Procedure Laterality Date  . Colostomy    .  Revision urostomy cutaneous    . Esophagogastroduodenoscopy  03-18-04  . Electrocardiogram  04-27-06  . Placement of new left forearm arteriovenous graft  03-11-08  . Left heart catheterization and right heart catheterization  12-10    R. heart cath showed elevated left and right heart filling pressures w/ pulmonary artery pressure elevated mildly out of proportion to the wedge. The left heart cath showed diffuse distal vessel disease as well as a 75% stenosis in the mid circumflex w/ a 90% stenosis of the ostial first obtuse marginal. These lesions were in close proximity. there was a 60-70%mild RCA stenosis.   . Arteriovenous graft placement  2010  . 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 Colon surgery    . Cardiac catheterization    . Eye surgery      Cataract Left  . Pars plana vitrectomy  11/29/2011    Procedure: PARS PLANA VITRECTOMY WITH 23 GAUGE;  Surgeon: Greer Geiger, MD;  Location: [facility] OR;  Service: Ophthalmology;  Laterality: Right;  Right Eye 23 ga vitrectomy with membrane peel  . Pars plana vitrectomy Left 02/28/2012     Procedure: PARS PLANA VITRECTOMY WITH 23 GAUGE;  Surgeon: Greer Geiger, MD;  Location: [facility] OR;  Service: Ophthalmology;  Laterality: Left;    Prior to Admission medications   Medication Sig Start Date End Date Taking? Authorizing Provider  amiodarone (PACERONE) 200 MG tablet Take 100 mg by mouth daily.   Yes Historical Provider, MD  aspirin 81 MG chewable tablet Chew 81 mg by mouth daily.     Yes Historical Provider, MD  calcium acetate (PHOSLO) 667 MG capsule Take 2,001 mg by mouth 3 (three) times daily with meals. Take 2 capsules in the middle of each meal each day   Yes Historical Provider, MD  dorzolamide-timolol (COSOPT) 22.3-6.8 MG/ML ophthalmic solution Place 1 drop into both eyes 2 (two) times daily.  01/26/13  Yes Historical Provider, MD  Fluticasone-Salmeterol (ADVAIR) 250-50 MCG/DOSE AEPB Inhale 1 puff into the lungs daily as needed (shortness of breath).  05/28/12  Yes Aleksei V Plotnikov, MD  insulin lispro protamine-lispro (HUMALOG 50/50) (50-50) 100 UNIT/ML SUSP Inject 20 Units into the skin daily with breakfast. 06/18/12  Yes Sean Ellison, MD  isosorbide mononitrate (IMDUR) 60 MG 24 hr tablet Take 1 tablet (60 mg total) by mouth daily. 01/30/13  Yes Steven C Klein, MD  metoprolol (LOPRESSOR) 50 MG tablet Take 50 mg by mouth daily. Daily except on monday, Wednesday, and friday   Yes Historical Provider, MD  simvastatin (ZOCOR) 10 MG tablet Take 1 tablet (10 mg total) by mouth daily. 01/30/13  Yes Steven C Klein, MD    Scheduled Meds: . amiodarone  100 mg Oral Daily  . calcium acetate  2,001 mg Oral TID WC  . dorzolamide-timolol  1 drop Both Eyes BID  . insulin aspart  0-5 Units Subcutaneous QHS  . insulin aspart  0-9 Units Subcutaneous TID WC  . metoprolol  50 mg Oral Q T,Th,S,Su  . mometasone-formoterol  2 puff Inhalation BID  . pantoprazole (PROTONIX) IV  40 mg Intravenous Q12H  . simvastatin  10 mg Oral Daily  . sodium chloride  3 mL Intravenous Q12H   Infusions:   PRN  Meds: sodium chloride, sodium chloride, acetaminophen, albuterol, feeding supplement (NEPRO CARB STEADY), lidocaine (PF), lidocaine-prilocaine, morphine injection, nitroGLYCERIN, ondansetron (ZOFRAN) IV, pentafluoroprop-tetrafluoroeth   Allergies as of 01/31/2013 - Review Complete 01/31/2013  Allergen Reaction Noted  .   Ace inhibitors Cough   . Eggs or egg-derived products Nausea And Vomiting 02/26/2012  . Enalapril Cough 11/08/2010  . Lisinopril Cough 05/18/2006  . Omnipaque [iohexol] Hives 11/08/2010    Family History  Problem Relation Age of Onset  . Cancer Sister     colon  . Hypertension Other   . Hypertension Mother   . Coronary artery disease Mother   . Hypertension Father   . Diabetes Father     History   Social History  . Marital Status: Single    Spouse Name: N/A    Number of Children: N/A  . Years of Education: N/A   Occupational History  . Not on file.   Social History Main Topics  . Smoking status: Never Smoker   . Smokeless tobacco: Never Used  . Alcohol Use: No  . Drug Use: No  . Sexual Activity: No   Social History Narrative  . Lives with her daughter in their home.     REVIEW OF SYSTEMS: Constitutional:  Per HPI ENT:  No nose bleeds Pulm:  No SOB or cough.  CV:  No palpitations, no LE edema.  GU:  No hematuria and urine goes into ostomy bag, no frequency GI:  Per HPI Heme:  Per HPI   Transfusions:  Per HPI Neuro:  No headaches, no peripheral tingling or numbness Derm:  No itching, no rash or sores.  Endocrine:  No sweats or chills.  No polyuria or dysuria Immunization:  Up to date on flu shot Travel:  None beyond local counties in last few months.    PHYSICAL EXAM: Vital signs in last 24 hours: Filed Vitals:   02/01/13 1038  BP: 119/54  Pulse: 91  Temp:   Resp:    Wt Readings from Last 3 Encounters:  01/31/13 91.3 kg (201 lb 4.5 oz)  01/30/13 89.812 kg (198 lb)  01/30/13 90.719 kg (200 lb)   General: pleasant, alert,  comfortable and well appearing AAF in NAD Head:  No swelling or asymmetry  Eyes:  No icterus or pallor Ears:  Not HOH  Nose:  No discharge or congestion Mouth:  Clear, moist, fair dental condition. Neck:  No JVD or bruits,   No mass or TMG Lungs:  Clear bil.  Good BS.  No adventitious sounds.  No dyspnea or cough Heart: RRR.  No MRG Abdomen:  Soft, obese.  Pale yellow urine in ostomy on far right abdomen, maroon bloody stool in ostomy bag.   Rectal: deferred   Musc/Skeltl: no joint swelling or contractures Extremities:  No CCE  Neurologic:  Pleasant, fully oriented,  Good historian.  No tremor.  No limb weakness Skin:  No rash or sores Tattoos:  None.  Nodes:  No cervical adenopathy   Psych:  Pleasant, not depressed or agitated.  Relaxed. Cooperative   Intake/Output from previous day: 01/23 0701 - 01/24 0700 In: 277.5 [P.O.:240; I.V.:37.5] Out: -  Intake/Output this shift:    LAB RESULTS:  Recent Labs  01/31/13 1452 02/01/13 0456  WBC 7.6 7.9  HGB 9.4* 7.4*  HCT 29.1* 22.4*  PLT 163 193  MCV     89  BMET Lab Results  Component Value Date   NA 146 02/01/2013   NA 144 01/31/2013   NA 137 02/28/2012   K 4.9 02/01/2013   K 4.6 01/31/2013   K 4.9 02/28/2012   CL 103 02/01/2013   CL 100 01/31/2013   CL 97 02/28/2012   CO2 24 02/01/2013  CO2 25 01/31/2013   CO2 23 02/28/2012   GLUCOSE 106* 02/01/2013   GLUCOSE 89 01/31/2013   GLUCOSE 91 02/28/2012   BUN 48* 02/01/2013   BUN 41* 01/31/2013   BUN 49* 02/28/2012   CREATININE 7.63* 02/01/2013   CREATININE 6.59* 01/31/2013   CREATININE 6.86* 02/28/2012   CALCIUM 7.8* 02/01/2013   CALCIUM 8.4 01/31/2013   CALCIUM 8.9 02/28/2012   LFT No results found for this basename: PROT, ALBUMIN, AST, ALT, ALKPHOS, BILITOT, BILIDIR, IBILI,  in the last 72 hours PT/INR Lab Results  Component Value Date   INR 0.99 05/16/2010   INR 0.94 01/08/2010   INR 1.00 12/24/2008   Drugs of Abuse     Component Value Date/Time   LABOPIA NONE DETECTED  05/20/2011 2219   [place] DETECTED 05/20/2011 2219   LABBENZ NONE DETECTED 05/20/2011 2219   AMPHETMU NONE DETECTED 05/20/2011 2219   THCU NONE DETECTED 05/20/2011 2219   LABBARB NONE DETECTED 05/20/2011 2219     RADIOLOGY STUDIES: Dg Chest Port 1 View  01/31/2013   CLINICAL DATA:  Chest pressure and short of breath  EXAM: PORTABLE CHEST - 1 VIEW  COMPARISON:  05/28/2012  FINDINGS: Mild cardiomegaly. Low volumes. Basilar atelectasis. Vascular congestion. No pneumothorax.  IMPRESSION: Mild cardiomegaly and vascular congestion with basilar atelectasis.   Electronically Signed   By: Maryclare Bean M.D.   On: 01/31/2013 13:02    ENDOSCOPIC STUDIES: 02/2004  Colonoscopy  Findings  STRICTURE / STENOSIS: Stenosis in Descending Colon. Seen  on withdrawal, after scope became temporarily fixed after reaching  the cecum. Required massage of ostomy and hernia to allow withdrawal of scope.  PRIOR SURGERY: Cecum. Comments: there appears to be an ileo-colonic  anastamosis with intact ileocecal valve.  - DIVERTICULOSIS: Cecum to Sigmoid Colon. ICD9: Diverticulosis, Colon:  562.10. Comments: scattered diverticula.  Comments:  IF HERNIA BECOMED PROBLEMATIC WOULD HAVE HER SEE A SURGEON NEED TO CONSIDER EGD, WILL DISCUSS WITH PATIENT. I WOULD NOT REPEAT A  COLONOSCPY IN HER GIVEN THE ENTRAPMENT ISSUE, WOULD DO CT  COLONOSCOPY OR OTHER IMAGING IN THE FUTURE AS FAR AS FOLLOW-UP FOR  REMOTE CANCER  03/2010 Barium Enema IMPRESSION:  The previously seen area of high stricture appears to be related to  compression from the anterior abdominal wall at the site the bowel  passes from the hernia sac into the abdominal cavity. This is  improved with superior pressure on the anterior abdominal wall.  03/2004  EGD Findings  Normal: Proximal Esophagus to Mid Esophagus.  - ESOPHAGEAL INFLAMMATION: suspected as a result of reflux. Severity  is severe, ulcerations present. Proximal margin 35 cm from mouth,  distal  margin 40 cm. Length of inflammation: 5 cm. Edema present.  [place] Classification: Grade C. ICD9: Esophagitis, Reflux: 530.  11.  - MUCOSAL ABNORMALITY: Fundus to Antrum. Erythematous mucosa. Red  spots present. ICD9: Gastritis, Unspecified: 535.50.  Normal: Duodenal Bulb to Duodenal 2nd Portion.  Comments:  I WOULD CONSIDER REPEAT EGD TO DOCUMENT HEALING    IMPRESSION:   *  Bleeding into ostomy. Started in small amount 3 days ago, vigorous bleeding with maroon stool noted this AM in pt who was receiving Heparin, this has been discontinued. Rule out recurrent esophagitis, ulcer, gastritis, AVMs 03/2004 EGD with esophageal inflammation.  This was supposed to have EGD follow up, but not clear it was ever performed.  She does not take PPI at home.  Started on bid IV protonix as of   *  ABL anemia.  2 units blood ordered. Receives Aranesp at HD  *  1986 colon cancer diagnosed.  S/p colostomy and urostomy  *  Abdominal hernia, preventing succesful colonoscopy in 2006.  Not felt to be safe to pursue colonoscopy.  Do not find results of a virtual colonoscopy, though it may have been performed.   *  Chest pressure.  Ruled in for non STEMI.  Heparin discontinued.  No recurrent chest pain.  *  ESRD.  For HD today.     PLAN:     *  Continue the Protonix and holding of heparin.  Plan EGD later today vs tomorrow.  If no plans to scope today she can have clears.  Orders for EGD placed in depot   Azucena Freed  02/01/2013, 11:10 AM Pager: [phone]  GI ATTENDING  History, laboratories, x-rays, prior endoscopy reports all personally reviewed. Patient personally seen and examined. Agree with H&P as outlined above. Patient with multiple significant medical problems as outlined. Presents with unstable angina and evidence for non-STEMI. Was placed on heparin. Began to have low volume maroon stools per ostomy. Heparin stopped. Modest drop in hemoglobin. Being transfused. Looks well and  physical exam. No complaints. Some maroonish stool. Prior EGD in 2006 with severe ulcerative esophagitis. Prior colonoscopy the stoma revealing colonic stricturing and diverticulosis. Followup barium enema (in lieu of colonoscopy due to anatomic problems) in 2011 for colonic neoplasia surveillance was negative except for the aforementioned findings at prior colonoscopy. Agree with treatment plan thus far. Continue PPI. We will plan diagnostic EGD tomorrow. The patient is high-risk given her comorbidities.The nature of the procedure, as well as the risks, benefits, and alternatives were carefully and thoroughly reviewed with the patient. Ample time for discussion and questions allowed. The patient understood, was satisfied, and agreed to proceed.Docia Chuck Geri [place]., M.D. [facility] Division of Gastroenterology

## 2013-02-02 NOTE — Interval H&P Note (Signed)
History and Physical Interval Note:  02/02/2013 12:51 PM  [name]  has presented today for surgery, with the diagnosis of blood in ostomy, anemia.  The various methods of treatment have been discussed with the patient and family. After consideration of risks, benefits and other options for treatment, the patient has consented to  Procedure(s): ESOPHAGOGASTRODUODENOSCOPY (EGD) (N/A) as a surgical intervention .  The patient's history has been reviewed, patient examined, no change in status, stable for surgery.  I have reviewed the patient's chart and labs.  Questions were answered to the patient's satisfaction.     Scarlette Shorts

## 2013-02-02 NOTE — Progress Notes (Signed)
[facility] PROGRESS NOTE  [name] CXK:[id number] DOB: [birth year] DOA: 01/31/2013 PCP: Walker Kehr, MD  Assessment/Plan: -N-STEMI, angina; patient presents with chest pain. Troponin positive 0.39. Discontinue heparin Gtt in setting of active GI bleed. Hold aspirin also. Continue with simvastatin, metoprolol with holder parameters for hypotension. Cardiology following.  Patient chest pain free at this time. Blood transfusion as needed. ECHO with normal EF.   -Acute Gi bleed: patient with bright red blood colostomy bag. IV heparin stopped. Hb decrease from 9 to 7.  Continue  NPO status. Continue with  IV protonix. Cycle HB every 8 hour. S/P 3 units of PRBC. Plan for endoscopy today. CBC for this morning pending, patient was refusion lab drawn due to [place].   -Encephalopathy, delirium; patient became agitated, non cooperative 1-24 night. She received Haldol. She is calm this morning, sleeping, wake up to answer questions, keep eyes close. Neuro exam appears to be non focal. She is confuse, she didn't know she is at the hospital. She has not received sedatives or opioid prior to AMS episode.  -Will check CT head. Check LFT, ammonia level.   -UTI per outpatient labs - Cultures from 1/16 indicate Klebsiella (sensitive to [facility] - 2 doses remaining) and MRSA (sensitive to Vanc - 1 dose remaining.) Pharmacy consulted to continue therapy.   -ESRD; nephrologist following. Had dialysis 1-24.  -Acute blood loss anemia: in setting of GI bleed. See problem # 2. S/P 3 units of PRBC.  -Atrial Tachycardia; Continue with amiodarone.  -Diabetes: SSI.    Code Status: Full Code.  Family Communication: care discussed with patient.  Disposition Plan: Transfer to step down for close monitoring.    Consultants:  GI, [place].   renal  Cardiology.   Procedures: ECHO;Left ventricle: The cavity size was normal. Wall thickness was increased in a pattern of mild LVH. Systolic function was  normal. The estimated ejection fraction was in the range of 55% to 60%. - Aortic valve: Mild regurgitation. - Mitral valve: Mild regurgitation. - Pulmonary arteries: PA peak pressure: 24mm Hg (S).     Antibiotics:  none  HPI/Subjective: Events from last night notice. Patient with acute encephalopathy, delirium.  Patient sleepy this morning, wake up to answer some questions. She is confuse.  Dark blood ostomy bag.    Objective: Filed Vitals:   02/02/13 0245  BP: 151/60  Pulse: 96  Temp:   Resp: 19    Intake/Output Summary (Last 24 hours) at 02/02/13 0738 Last data filed at 02/01/13 2350  Gross per 24 hour  Intake    653 ml  Output   2650 ml  Net  -1997 ml   Filed Weights   01/31/13 2207 02/01/13 1725 02/01/13 2130  Weight: 91.3 kg (201 lb 4.5 oz) 93 kg (205 lb 0.4 oz) 90.6 kg (199 lb 11.8 oz)    Exam:   General:  No distress. Sleepy, calm.   Cardiovascular: S 1, S 2 RRR  Respiratory: CTA  Abdomen: Bs present, soft, NT, colostomy in place, bright blood on bag.   Musculoskeletal: no edema.   Neuro; sleepy , wake up to answer questions, following command, keep eyes close. Move all four extremities.   Data Reviewed: Basic Metabolic Panel:  Recent Labs Lab 01/31/13 1452 02/01/13 0456  NA 144 146  K 4.6 4.9  CL 100 103  CO2 25 24  GLUCOSE 89 106*  BUN 41* 48*  CREATININE 6.59* 7.63*  CALCIUM 8.4 7.8*   Liver Function Tests: No results found  for this basename: AST, ALT, ALKPHOS, BILITOT, PROT, ALBUMIN,  in the last 168 hours No results found for this basename: LIPASE, AMYLASE,  in the last 168 hours No results found for this basename: AMMONIA,  in the last 168 hours CBC:  Recent Labs Lab 01/31/13 1452 02/01/13 0456 02/01/13 1640  WBC 7.6 7.9 7.9  HGB 9.4* 7.4* 8.0*  HCT 29.1* 22.4* 24.0*  MCV 91.2 89.2 89.6  PLT 163 193 185   Cardiac Enzymes:  Recent Labs Lab 01/31/13 1452 01/31/13 2233 02/01/13 0456 02/01/13 1030  TROPONINI 0.31*  0.34* 0.39* 0.61*   BNP (last 3 results)  Recent Labs  01/31/13 1452  PROBNP 4117.0*   CBG:  Recent Labs Lab 01/31/13 2204 02/01/13 0746 02/01/13 1333 02/01/13 1701 02/01/13 2203  GLUCAP 136* 101* 92 133* 95    Recent Results (from the past 240 hour(s))  MRSA PCR SCREENING     Status: Abnormal   Collection Time    01/31/13 11:00 PM      Result Value Range Status   MRSA by PCR POSITIVE (*) NEGATIVE Final   Comment:            The GeneXpert MRSA Assay (FDA     approved for NASAL specimens     only), is one component of a     comprehensive MRSA colonization     surveillance program. It is not     intended to diagnose MRSA     infection nor to guide or     monitor treatment for     MRSA infections.     RESULT CALLED TO, READ BACK BY AND VERIFIED WITH:     Clent Ridges [id number] [facility]     Studies: Dg Chest Port 1 View  01/31/2013   CLINICAL DATA:  Chest pressure and short of breath  EXAM: PORTABLE CHEST - 1 VIEW  COMPARISON:  05/28/2012  FINDINGS: Mild cardiomegaly. Low volumes. Basilar atelectasis. Vascular congestion. No pneumothorax.  IMPRESSION: Mild cardiomegaly and vascular congestion with basilar atelectasis.   Electronically Signed   By: Maryclare Bean M.D.   On: 01/31/2013 13:02    Scheduled Meds: . amiodarone  100 mg Oral Daily  . calcium acetate  2,001 mg Oral TID WC  . cefTAZidime (FORTAZ)  IV  2 g Intravenous Q48H  . darbepoetin (ARANESP) injection - DIALYSIS  100 mcg Intravenous Q Sat-HD  . dorzolamide-timolol  1 drop Both Eyes BID  . doxercalciferol  2 mcg Intravenous Once in dialysis  . [START ON 02/03/2013] doxercalciferol  2 mcg Intravenous Q M,W,F-HD  . insulin aspart  0-5 Units Subcutaneous QHS  . insulin aspart  0-9 Units Subcutaneous TID WC  . metoprolol  50 mg Oral Q T,Th,S,Su  . mometasone-formoterol  2 puff Inhalation BID  . multivitamin  1 tablet Oral QHS  . pantoprazole (PROTONIX) IV  40 mg Intravenous Q12H  . simvastatin  10 mg Oral Daily   . sodium chloride  3 mL Intravenous Q12H   Continuous Infusions:    Principal Problem:   NSTEMI (non-ST elevated myocardial infarction) Active Problems:   Hypertensive heart disease   LBBB   Coronary artery disease   ESRD on dialysis   Type I (juvenile type) diabetes mellitus with renal manifestations, not stated as uncontrolled   S/P transmetatarsal amputation of foot   Chest pain    Time spent: 35 minutes.     [name]  [facility] Pager [phone]. If 7PM-7AM, please contact night-coverage at [email or web address],  password [facility] 02/02/2013, 7:38 AM  LOS: 2 days

## 2013-02-02 NOTE — Progress Notes (Signed)
Called to patient's room.  Patient was in bathroom and had emptied her colostomy into the toilet.  Contents of colostomy was approximately 175-200 ml. of bright red blood with clots.  Attending MD and covering GI MD notified.  See NO

## 2013-02-03 ENCOUNTER — Inpatient Hospital Stay (HOSPITAL_COMMUNITY): Payer: Medicare Other

## 2013-02-03 ENCOUNTER — Encounter (HOSPITAL_COMMUNITY): Payer: Self-pay | Admitting: Internal Medicine

## 2013-02-03 DIAGNOSIS — R079 Chest pain, unspecified: Secondary | ICD-10-CM | POA: Diagnosis not present

## 2013-02-03 DIAGNOSIS — N390 Urinary tract infection, site not specified: Secondary | ICD-10-CM | POA: Diagnosis not present

## 2013-02-03 DIAGNOSIS — F29 Unspecified psychosis not due to a substance or known physiological condition: Secondary | ICD-10-CM | POA: Diagnosis not present

## 2013-02-03 DIAGNOSIS — K922 Gastrointestinal hemorrhage, unspecified: Secondary | ICD-10-CM | POA: Diagnosis not present

## 2013-02-03 DIAGNOSIS — I214 Non-ST elevation (NSTEMI) myocardial infarction: Secondary | ICD-10-CM | POA: Diagnosis not present

## 2013-02-03 DIAGNOSIS — D631 Anemia in chronic kidney disease: Secondary | ICD-10-CM | POA: Diagnosis not present

## 2013-02-03 DIAGNOSIS — G9340 Encephalopathy, unspecified: Secondary | ICD-10-CM

## 2013-02-03 DIAGNOSIS — N186 End stage renal disease: Secondary | ICD-10-CM | POA: Diagnosis not present

## 2013-02-03 LAB — GLUCOSE, CAPILLARY
GLUCOSE-CAPILLARY: 93 mg/dL (ref 70–99)
Glucose-Capillary: 132 mg/dL — ABNORMAL HIGH (ref 70–99)
Glucose-Capillary: 134 mg/dL — ABNORMAL HIGH (ref 70–99)
Glucose-Capillary: 102 mg/dL — ABNORMAL HIGH (ref 70–99)

## 2013-02-03 LAB — CBC
HEMATOCRIT: 30.7 % — AB (ref 36.0–46.0)
Hemoglobin: 10.3 g/dL — ABNORMAL LOW (ref 12.0–15.0)
MCH: 30 pg (ref 26.0–34.0)
MCHC: 33.6 g/dL (ref 30.0–36.0)
MCV: 89.5 fL (ref 78.0–100.0)
Platelets: 191 10*3/uL (ref 150–400)
RBC: 3.43 MIL/uL — ABNORMAL LOW (ref 3.87–5.11)
RDW: 14.4 % (ref 11.5–15.5)
WBC: 6.3 10*3/uL (ref 4.0–10.5)

## 2013-02-03 LAB — HEPATITIS B SURFACE ANTIGEN: HEP B S AG: NEGATIVE

## 2013-02-03 MED ORDER — DOXERCALCIFEROL 4 MCG/2ML IV SOLN
INTRAVENOUS | Status: AC
  Filled 2013-02-03: qty 2

## 2013-02-03 MED ORDER — PANTOPRAZOLE SODIUM 40 MG PO TBEC
40.0000 mg | DELAYED_RELEASE_TABLET | Freq: Every day | ORAL | Status: DC
  Administered 2013-02-03 – 2013-02-04 (×2): 40 mg via ORAL
  Filled 2013-02-03 (×2): qty 1

## 2013-02-03 NOTE — Progress Notes (Signed)
Patient Name: [name]      SUBJECTIVE pt with atrial tach on amio, ESRD on HD admitted w cp and minimal +Tn >>Heparin and    subsequently to have major (>=3 UPRBC) with + findings on endoscopy  Hgb 10 this am  Without chest pain or sob   Echo 55-60% 1/15  Past Medical History  Diagnosis Date  . Hyperlipidemia   . Mitral valve insufficiency and aortic valve insufficiency   . Cardiomyopathy- mixed   . 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 Chronic diastolic heart failure   . Anemia   . Coronary artery disease     Previously decreased EF; echo 113 normal LV function  . Hernia, incisional     abd  . History of colon cancer     history of colon cancer.  1986  . Arthritis   . LBBB (left bundle branch block)   . Atrial tachycardia     on amiod  . Hypertensive heart disease     sees Dr. Alain Marion  . Stroke     2011/12  . Renal failure     ESRD, Dr Dunham/Dr. Lyda Kalata.  M, W, Fr  . Obesity     Scheduled Meds:  Scheduled Meds: . amiodarone  100 mg Oral Daily  . calcium acetate  2,001 mg Oral TID WC  . cefTAZidime (FORTAZ)  IV  2 g Intravenous Q48H  . darbepoetin (ARANESP) injection - DIALYSIS  100 mcg Intravenous Q Sat-HD  . dorzolamide-timolol  1 drop Both Eyes BID  . doxercalciferol  2 mcg Intravenous Once in dialysis  . doxercalciferol  2 mcg Intravenous Q M,W,F-HD  . insulin aspart  0-5 Units Subcutaneous QHS  . insulin aspart  0-9 Units Subcutaneous TID WC  . metoprolol  50 mg Oral Q T,Th,S,Su  . mometasone-formoterol  2 puff Inhalation BID  . multivitamin  1 tablet Oral QHS  . pantoprazole (PROTONIX) IV  40 mg Intravenous Q12H  . simvastatin  10 mg Oral Daily  . sodium chloride  3 mL Intravenous Q12H   Continuous Infusions:   PHYSICAL EXAM Filed Vitals:   02/02/13 2110 02/02/13 2200 02/03/13 0030 02/03/13 0448  BP: 122/58 110/45 138/62 132/60  Pulse: 101 95 92 93  Temp:   98.4 F  (36.9 C) 98 F (36.7 C)  TempSrc:   Oral Oral  Resp: 15 28 0 16  Height:      Weight:      SpO2: 100% 98% 99% 100%    Well developed and nourished in no acute distress HENT normal Neck supple Clear Regular rate and rhythm, 2/6 early systolic m at LUSB Abd-soft with active BS No Clubbing cyanosis edema Skin-warm and dry A & Oriented  Grossly normal sensory and motor function   TELEMETRY: Reviewed telemetry pt in  Sinus tach with LBBB:    Intake/Output Summary (Last 24 hours) at 02/03/13 0757 Last data filed at 02/02/13 2100  Gross per 24 hour  Intake      3 ml  Output    175 ml  Net   -172 ml    LABS: Basic Metabolic Panel:  Recent Labs Lab 01/31/13 1452 02/01/13 0456 02/02/13 0815 02/02/13 1645  NA 144 146 138 137  K 4.6 4.9 3.8 4.1  CL 100 103 97 96  CO2 25 24 28 25   GLUCOSE 89 106* 92 82  BUN 41*  48* 17 22  CREATININE 6.59* 7.63* 4.18* 4.93*  CALCIUM 8.4 7.8* 7.9* 8.1*  PHOS  --   --  3.8  --    Cardiac Enzymes:  Recent Labs  01/31/13 2233 02/01/13 0456 02/01/13 1030  TROPONINI 0.34* 0.39* 0.61*   CBC:  Recent Labs Lab 01/31/13 1452 02/01/13 0456 02/01/13 1640 02/02/13 0815 02/02/13 1645 02/02/13 2050 02/03/13 0350  WBC 7.6 7.9 7.9 6.8 6.2  --  6.3  HGB 9.4* 7.4* 8.0* 9.9* 10.2* 10.2* 10.3*  HCT 29.1* 22.4* 24.0* 29.2* 29.8* 30.7* 30.7*  MCV 91.2 89.2 89.6 88.2 89.0  --  89.5  PLT 163 193 185 168 174  --  191   PROTIME: No results found for this basename: LABPROT, INR,  in the last 72 hours Liver Function Tests:  Recent Labs  02/02/13 0815  AST 16  ALT 6  ALKPHOS 92  BILITOT 0.3  PROT 6.5  ALBUMIN 2.6*  2.6*   No results found for this basename: LIPASE, AMYLASE,  in the last 72 hours BNP: BNP (last 3 results)  Recent Labs  01/31/13 1452  PROBNP 4117.0*   D-Dimer: No results found for this basename: DDIMER,  in the last 72 hours Hemoglobin A1C: No results found for this basename: HGBA1C,  in the last 72  hours Fasting Lipid Panel: No results found for this basename: CHOL, HDL, LDLCALC, TRIG, CHOLHDL, LDLDIRECT,  in the last 72 hours Thyroid Function Tests: No results found for this basename: TSH, T4TOTAL, FREET3, T3FREE, THYROIDAB,  in the last 72 hours Anemia Panel: No results found for this basename: VITAMINB12, FOLATE, FERRITIN, TIBC, IRON, RETICCTPCT,  in the last 72 hours      ASSESSMENT AND PLAN:  Principal Problem:   NSTEMI (non-ST elevated myocardial infarction) Active Problems:   Hypertensive heart disease   LBBB   Coronary artery disease   ESRD on dialysis   Type I (juvenile type) diabetes mellitus with renal manifestations, not stated as uncontrolled   S/P transmetatarsal amputation of foot   Chest pain   Acute encephalopathy   GI bleed  Mobilize Ok to discharge from our point of view Cardiac followup as previously scheduled Will ask GI to review findings with patient Continue amio for atrial tachy   Signed, Virl Axe MD  02/03/2013

## 2013-02-03 NOTE — Progress Notes (Signed)
[facility] PROGRESS NOTE  [name] ZSW:[id number] DOB: 09-23-1945 DOA: 01/31/2013 PCP: Walker Kehr, MD  Assessment/Plan: -N-STEMI, angina; patient presents with chest pain. Troponin positive 0.39. Discontinue heparin Gtt in setting of active GI bleed. Hold aspirin also. Continue with simvastatin, metoprolol with holder parameters for hypotension. Cardiology following.  Patient chest pain free at this time. Blood transfusion as needed. ECHO with normal EF. Demand ischemia. No further evaluation per cardio recommendation.   -Acute Gi bleed: Patient S/P Endoscopy that show: Erosive esophagitis , Antral erosions, GI bleeding likely secondary to the above findings in the face of  anticoagulation.  Patient with bright red blood colostomy bag. IV heparin stopped. Hb decrease from 9 to 7. Continue with  IV protonix.  S/P 3 units of PRBC.   -Had small amount of blood in colostomy bag last night, suspect old blood. HB stable at 10.  -Start Clear diet.   -Encephalopathy, delirium; patient became agitated, non cooperative 1-24 night. She received Haldol.  Neuro exam non focal.  LFT NL , ammonia level at 30.  CT head negative for acute bleed. Patient back to baseline. Suspect delirium in setting of acute illness.   -UTI per outpatient labs - Cultures from 1/16 indicate Klebsiella (sensitive to [facility] - 2 doses remaining) and MRSA (sensitive to [facility] - 1 dose remaining.) Pharmacy consulted to continue therapy. Last dose of [place] today.   -ESRD; nephrologist following. Had dialysis 1-24.  -Acute blood loss anemia: in setting of GI bleed. See problem # 2. S/P 3 units of PRBC.  -Atrial Tachycardia; Continue with amiodarone.  -Diabetes: SSI.    Code Status: Full Code.  Family Communication: care discussed with patient.  Disposition Plan: Transfer to telemetry.   Consultants:  GI, Artemus.   renal  Cardiology.   Procedures: ECHO;Left ventricle: The cavity size was normal. Wall  thickness was increased in a pattern of mild LVH. Systolic function was normal. The estimated ejection fraction was in the range of 55% to 60%. - Aortic valve: Mild regurgitation. - Mitral valve: Mild regurgitation. - Pulmonary arteries: PA peak pressure: 78mm Hg (S).     Antibiotics:  none  HPI/Subjective: Small amount of blood on colostomy bag 1-25.  No blood notice today.  She alert and oriented, back to baseline.   Objective: Filed Vitals:   02/03/13 0800  BP:   Pulse:   Temp: 97.1 F (36.2 C)  Resp:     Intake/Output Summary (Last 24 hours) at 02/03/13 0833 Last data filed at 02/02/13 2100  Gross per 24 hour  Intake      3 ml  Output    175 ml  Net   -172 ml   Filed Weights   01/31/13 2207 02/01/13 1725 02/01/13 2130  Weight: 91.3 kg (201 lb 4.5 oz) 93 kg (205 lb 0.4 oz) 90.6 kg (199 lb 11.8 oz)    Exam:   General:  No distress.  Cardiovascular: S 1, S 2 RRR  Respiratory: CTA  Abdomen: Bs present, soft, NT, colostomy in place, no blood notice. Urostomy in place.   Musculoskeletal: no edema.   Neuro; non focal.   Data Reviewed: Basic Metabolic Panel:  Recent Labs Lab 01/31/13 1452 02/01/13 0456 02/02/13 0815 02/02/13 1645  NA 144 146 138 137  K 4.6 4.9 3.8 4.1  CL 100 103 97 96  CO2 25 24 28 25   GLUCOSE 89 106* 92 82  BUN 41* 48* 17 22  CREATININE 6.59* 7.63* 4.18* 4.93*  CALCIUM  8.4 7.8* 7.9* 8.1*  PHOS  --   --  3.8  --    Liver Function Tests:  Recent Labs Lab 02/02/13 0815  AST 16  ALT 6  ALKPHOS 92  BILITOT 0.3  PROT 6.5  ALBUMIN 2.6*  2.6*   No results found for this basename: LIPASE, AMYLASE,  in the last 168 hours  Recent Labs Lab 02/02/13 0818  AMMONIA 30   CBC:  Recent Labs Lab 02/01/13 0456 02/01/13 1640 02/02/13 0815 02/02/13 1645 02/02/13 2050 02/03/13 0350  WBC 7.9 7.9 6.8 6.2  --  6.3  HGB 7.4* 8.0* 9.9* 10.2* 10.2* 10.3*  HCT 22.4* 24.0* 29.2* 29.8* 30.7* 30.7*  MCV 89.2 89.6 88.2 89.0  --   89.5  PLT 193 185 168 174  --  191   Cardiac Enzymes:  Recent Labs Lab 01/31/13 1452 01/31/13 2233 02/01/13 0456 02/01/13 1030  TROPONINI 0.31* 0.34* 0.39* 0.61*   BNP (last 3 results)  Recent Labs  01/31/13 1452  PROBNP 4117.0*   CBG:  Recent Labs Lab 02/01/13 2203 02/02/13 0831 02/02/13 1623 02/02/13 2228 02/03/13 0759  GLUCAP 95 89 91 125* 93    Recent Results (from the past 240 hour(s))  MRSA PCR SCREENING     Status: Abnormal   Collection Time    01/31/13 11:00 PM      Result Value Range Status   MRSA by PCR POSITIVE (*) NEGATIVE Final   Comment:            The GeneXpert MRSA Assay (FDA     approved for NASAL specimens     only), is one component of a     comprehensive MRSA colonization     surveillance program. It is not     intended to diagnose MRSA     infection nor to guide or     monitor treatment for     MRSA infections.     RESULT CALLED TO, READ BACK BY AND VERIFIED WITH:     Clent Ridges [id number]     Studies: Ct Head Wo Contrast  02/03/2013   CLINICAL DATA:  Confusion, altered mental status.  EXAM: CT HEAD WITHOUT CONTRAST  TECHNIQUE: Contiguous axial images were obtained from the base of the skull through the vertex without intravenous contrast.  COMPARISON:  05/20/2011  FINDINGS: Since the prior, there has been progression/ increase of subcortical and periventricular white matter hypodensities. Remote left caudate head lacunar infarction. No definite cortical based (large artery) infarction. No hydrocephalus. No intraparenchymal hemorrhage or abnormal extra-axial fluid collection. The visualized paranasal sinuses and mastoid air cells are predominantly clear. There is mucus/debris within the left sphenoid chamber. Atherosclerotic vascular calcifications.  IMPRESSION: White matter hypodensities are nonspecific, often seen in the setting of chronic microangiopathic change. MRI recommended if there is clinical concern for acute ischemia.   Left sphenoid chamber fluid/debris is mild. Correlate clinically if concerned for sinusitis.   Electronically Signed   By: Carlos Levering M.D.   On: 02/03/2013 04:06    Scheduled Meds: . amiodarone  100 mg Oral Daily  . calcium acetate  2,001 mg Oral TID WC  . cefTAZidime (FORTAZ)  IV  2 g Intravenous Q48H  . darbepoetin (ARANESP) injection - DIALYSIS  100 mcg Intravenous Q Sat-HD  . dorzolamide-timolol  1 drop Both Eyes BID  . doxercalciferol  2 mcg Intravenous Once in dialysis  . doxercalciferol  2 mcg Intravenous Q M,W,F-HD  . insulin aspart  0-5 Units  Subcutaneous QHS  . insulin aspart  0-9 Units Subcutaneous TID WC  . metoprolol  50 mg Oral Q T,Th,S,Su  . mometasone-formoterol  2 puff Inhalation BID  . multivitamin  1 tablet Oral QHS  . pantoprazole (PROTONIX) IV  40 mg Intravenous Q12H  . simvastatin  10 mg Oral Daily  . sodium chloride  3 mL Intravenous Q12H   Continuous Infusions:    Principal Problem:   NSTEMI (non-ST elevated myocardial infarction) Active Problems:   Hypertensive heart disease   LBBB   Coronary artery disease   ESRD on dialysis   Type I (juvenile type) diabetes mellitus with renal manifestations, not stated as uncontrolled   S/P transmetatarsal amputation of foot   Chest pain   Acute encephalopathy   GI bleed    Time spent: 25 minutes.     REGALADO,BELKYS  [facility] Pager [phone]. If 7PM-7AM, please contact night-coverage at [email or web address], password [facility] 02/03/2013, 8:33 AM  LOS: 3 days

## 2013-02-03 NOTE — Progress Notes (Signed)
To [facility] via bed on monitor for CT of head. Pt tolerated procedure.

## 2013-02-03 NOTE — Progress Notes (Signed)
Subjective:  Seen on dialysis, no current complaints, breathing well, but blood in colostomy bag.  Objective: Vital signs in last 24 hours: Temp:  [97.1 F (36.2 C)-98.6 F (37 C)] 98.1 F (36.7 C) (01/26 1150) Pulse Rate:  [79-101] 83 (01/26 1300) Resp:  [0-28] 20 (01/26 1300) BP: (98-144)/(42-102) 98/61 mmHg (01/26 1300) SpO2:  [97 %-100 %] 98 % (01/26 1150) Weight:  [90.2 kg (198 lb 13.7 oz)] 90.2 kg (198 lb 13.7 oz) (01/26 1150) Weight change:   Intake/Output from previous day: 01/25 0701 - 01/26 0700 In: 3 [I.V.:3] Out: 175 [Stool:175] Intake/Output this shift: Total I/O In: 240 [P.O.:240] Out: 125 [Stool:125]  Lab Results:  Recent Labs  02/02/13 1645 02/02/13 2050 02/03/13 0350  WBC 6.2  --  6.3  HGB 10.2* 10.2* 10.3*  HCT 29.8* 30.7* 30.7*  PLT 174  --  191   BMET:  Recent Labs  02/02/13 0815 02/02/13 1645  NA 138 137  K 3.8 4.1  CL 97 96  CO2 28 25  GLUCOSE 92 82  BUN 17 22  CREATININE 4.18* 4.93*  CALCIUM 7.9* 8.1*  ALBUMIN 2.6*  2.6*  --    No results found for this basename: PTH,  in the last 72 hours Iron Studies: No results found for this basename: IRON, TIBC, TRANSFERRIN, FERRITIN,  in the last 72 hours  EXAM: General appearance:  Alert, in no apparent distress Resp:  CTA without rales, rhonchi, or wheezes Cardio:  RRR without murmur or rub GI:  + BS, soft and nontender Extremities:  No edema Access:  AVG @ LUA with BFR 400 cc/min  Dialysis Orders: Center: MWF on [place] .  EDW 87.5 kg (might need an increase per op notes) F160 HD Bath 2K/2Ca Time 4:00 Heparin 3000 bolus/2000 mid  Access L AVF BFR 400 DFR A1.5  Hectorol 2 mcg IV/HD Epogen 2000 Units IV/HD Venofer 0  Recent labs: Hgb 9.3, Tsat 35%, P 5.7, PTH 187 in Oct   Assessment/Plan: 1. NSTEMI - elevated troponins, 2D echo with normal LV fxn, EF 55-60%, believed to be demand ischemia from GI bleed; no further chest pain, on Amiodarone, BB. 2. Acute GI bleed - EGD 1/25 showed  erosive esophagitis, antral erosions; bright-red blood in colostomy bag; IV Protonix, Heparin & ASA DC'd. 3. UTI - culture with Klebsiella on Fortaz & MRSA on Vancomycin. 4. ESRD - HD on MWF @ [place]; K 4.1.  HD today.  5. HTN/ Volume - BP 98/61 on Metoprolol 50 mg on non-HD days, UF goal 2.7 L. 6. Anemia - Hgb 10.3 s/p 3 U PRBCs, on Aranesp 100 mcg on Sat. 7. 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  8. Nutrition - Alb 2.6, high protein renal diet & vitamin. 9. Hx Atrial tachycardia - on Amiodarone. 10. Hx Colon Cancer - s/p surgery with colostomy in 1986.   LOS: 3 days   LYLES,CHARLES 02/03/2013,1:10 PM  I have seen and examined patient, discussed with PA and agree with assessment and plan as outlined above. Kelly Splinter MD pager [phone]    cell [phone] 02/03/2013, 1:34 PM

## 2013-02-04 DIAGNOSIS — K922 Gastrointestinal hemorrhage, unspecified: Secondary | ICD-10-CM | POA: Diagnosis not present

## 2013-02-04 DIAGNOSIS — G9340 Encephalopathy, unspecified: Secondary | ICD-10-CM | POA: Diagnosis not present

## 2013-02-04 DIAGNOSIS — R079 Chest pain, unspecified: Secondary | ICD-10-CM | POA: Diagnosis not present

## 2013-02-04 DIAGNOSIS — D62 Acute posthemorrhagic anemia: Secondary | ICD-10-CM | POA: Diagnosis not present

## 2013-02-04 DIAGNOSIS — I214 Non-ST elevation (NSTEMI) myocardial infarction: Secondary | ICD-10-CM | POA: Diagnosis not present

## 2013-02-04 DIAGNOSIS — K219 Gastro-esophageal reflux disease without esophagitis: Secondary | ICD-10-CM | POA: Diagnosis not present

## 2013-02-04 DIAGNOSIS — K5730 Diverticulosis of large intestine without perforation or abscess without bleeding: Secondary | ICD-10-CM | POA: Diagnosis not present

## 2013-02-04 LAB — CBC
HEMATOCRIT: 30.8 % — AB (ref 36.0–46.0)
Hemoglobin: 10.3 g/dL — ABNORMAL LOW (ref 12.0–15.0)
MCH: 30.3 pg (ref 26.0–34.0)
MCHC: 33.4 g/dL (ref 30.0–36.0)
MCV: 90.6 fL (ref 78.0–100.0)
PLATELETS: 176 10*3/uL (ref 150–400)
RBC: 3.4 MIL/uL — ABNORMAL LOW (ref 3.87–5.11)
RDW: 14 % (ref 11.5–15.5)
WBC: 7.6 10*3/uL (ref 4.0–10.5)

## 2013-02-04 LAB — GLUCOSE, CAPILLARY
Glucose-Capillary: 180 mg/dL — ABNORMAL HIGH (ref 70–99)
Glucose-Capillary: 168 mg/dL — ABNORMAL HIGH (ref 70–99)
Glucose-Capillary: 112 mg/dL — ABNORMAL HIGH (ref 70–99)

## 2013-02-04 MED ORDER — CHLORHEXIDINE GLUCONATE CLOTH 2 % EX PADS
6.0000 | MEDICATED_PAD | Freq: Every day | CUTANEOUS | Status: DC
  Administered 2013-02-04: 6 via TOPICAL

## 2013-02-04 MED ORDER — INSULIN LISPRO PROT & LISPRO (50-50 MIX) 100 UNIT/ML ~~LOC~~ SUSP: 10.0000 [IU] | Freq: Every day | SUBCUTANEOUS | Status: DC

## 2013-02-04 MED ORDER — MUPIROCIN 2 % EX OINT
1.0000 "application " | TOPICAL_OINTMENT | Freq: Two times a day (BID) | CUTANEOUS | Status: DC
  Filled 2013-02-04: qty 22

## 2013-02-04 MED ORDER — PANTOPRAZOLE SODIUM 40 MG PO TBEC: 40.0000 mg | DELAYED_RELEASE_TABLET | Freq: Two times a day (BID) | ORAL | Status: DC

## 2013-02-04 NOTE — Progress Notes (Signed)
[facility] Progress Note  Subjective:  Patient was seen by GI during this hospitalization for blood in her ostomy bag in the setting of anticoagulation therapy with heparin.  She underwent EGD on 1/26, which revealed erosive esophagitis and antral erosions, which were thought to be the source of bleeding.  Hgb has been stable for the past few days, but she still has persistent blood in her ostomy.  Patient wants to go home today, but hospitalists wanted GI to see her again and reassess before sending her home.  Last colonoscopy was in 02/2004 at which time she had entrapment due to stenosis in the descending colon and repeat colonoscopy was not recommended due to this issue.  Barium enema in 03/2010 showed the following:  The previously seen area of high stricture appears to be related to compression from the anterior abdominal wall at the site the bowel passes from the hernia sac into the abdominal cavity. This is improved with superior pressure on the anterior abdominal wall.  She did not have any blood in ostomy bag.  Says that it was just dark in color earlier today but no maroon or blood color.   Objective:  Vital signs in last 24 hours: Temp:  [97.6 F (36.4 C)-98.2 F (36.8 C)] 97.6 F (36.4 C) (01/27 1419) Pulse Rate:  [73-90] 73 (01/27 1419) Resp:  [18] 18 (01/27 1419) BP: (65-137)/(20-78) 136/65 mmHg (01/27 1419) SpO2:  [94 %-100 %] 100 % (01/27 1419) Weight:  [192 lb 3.9 oz (87.2 kg)] 192 lb 3.9 oz (87.2 kg) (01/26 1608) Last BM Date: 02/03/13 General:  Alert, Well-developed, in NAD Abdomen:  No blood or stool in ostomy bag.   Intake/Output from previous day: 01/26 0701 - 01/27 0700 In: 240 [P.O.:240] Out: 2317 [Stool:125] Intake/Output this shift: Total I/O In: 960 [P.O.:960] Out: -   Lab Results:  Recent Labs  02/02/13 1645 02/02/13 2050 02/03/13 0350 02/04/13 0400  WBC 6.2  --  6.3 7.6  HGB 10.2* 10.2* 10.3* 10.3*  HCT 29.8* 30.7* 30.7* 30.8*  PLT  174  --  191 176   BMET  Recent Labs  02/02/13 0815 02/02/13 1645  NA 138 137  K 3.8 4.1  CL 97 96  CO2 28 25  GLUCOSE 92 82  BUN 17 22  CREATININE 4.18* 4.93*  CALCIUM 7.9* 8.1*   LFT  Recent Labs  02/02/13 0815  PROT 6.5  ALBUMIN 2.6*  2.6*  AST 16  ALT 6  ALKPHOS 92  BILITOT 0.3  BILIDIR <0.2  IBILI NOT CALCULATED   Hepatitis Panel  Recent Labs  02/03/13 1226  HEPBSAG NEGATIVE    Ct Head Wo Contrast  02/03/2013   CLINICAL DATA:  Confusion, altered mental status.  EXAM: CT HEAD WITHOUT CONTRAST  TECHNIQUE: Contiguous axial images were obtained from the base of the skull through the vertex without intravenous contrast.  COMPARISON:  05/20/2011  FINDINGS: Since the prior, there has been progression/ increase of subcortical and periventricular white matter hypodensities. Remote left caudate head lacunar infarction. No definite cortical based (large artery) infarction. No hydrocephalus. No intraparenchymal hemorrhage or abnormal extra-axial fluid collection. The visualized paranasal sinuses and mastoid air cells are predominantly clear. There is mucus/debris within the left sphenoid chamber. Atherosclerotic vascular calcifications.  IMPRESSION: White matter hypodensities are nonspecific, often seen in the setting of chronic microangiopathic change. MRI recommended if there is clinical concern for acute ischemia.  Left sphenoid chamber fluid/debris is mild. Correlate clinically if concerned for sinusitis.  Electronically Signed   By: Carlos Levering M.D.   On: 02/03/2013 04:06    Assessment / Plan: *Bleeding into ostomy thought to be secondary to erosive esophagitis and antral erosions seen on EGD on 1/26. *ABLA: 2 units PRBC's received with good response and Hgb has been stable.  Aranesp received in HD.  *1986 colon cancer diagnosed. S/p colostomy and urostomy (ileal conduit).  *Abdominal hernia, preventing succesful colonoscopy in 2006. Not felt to be safe to pursue  repeat colonoscopy. *NSTEMI *ESRD on HD   -Continue PPI twice daily for 4 weeks then once daily indefinitely. -GI follow up prn. -Ok for D/C from GI standpoint.    LOS: 4 days   ZEHR, JESSICA D.  02/04/2013, 3:26 PM  Pager number [phone]   GI ATTENDING  Patient seen and examined. Well-known to me. Agree with above. EGD fairly unremarkable. May have some irritation below the stoma with some minor bleeding. Prior colonoscopy and barium enema with diverticulosis and colonic stricturing from surgery. No other abnormalities. Hemoglobin stable. No new recommendations. No additional GI workup planned. Will sign off.  Docia Chuck. Geri Seminole., M.D. [facility] Division of Gastroenterology

## 2013-02-04 NOTE — Progress Notes (Signed)
Subjective:   No complaints, not as much blood in colostomy bag as yesterday, hoping for discharge  Objective: Vital signs in last 24 hours: Temp:  [97.8 F (36.6 C)-98.2 F (36.8 C)] 98.1 F (36.7 C) (01/27 0506) Pulse Rate:  [79-92] 81 (01/27 0506) Resp:  [13-25] 18 (01/27 0506) BP: (65-146)/(20-102) 133/73 mmHg (01/27 0506) SpO2:  [94 %-100 %] 98 % (01/27 0748) Weight:  [87.2 kg (192 lb 3.9 oz)-90.2 kg (198 lb 13.7 oz)] 87.2 kg (192 lb 3.9 oz) (01/26 1608) Weight change:   Intake/Output from previous day: 01/26 0701 - 01/27 0700 In: 240 [P.O.:240] Out: 2317 [Stool:125] Intake/Output this shift: Total I/O In: 360 [P.O.:360] Out: -   Lab Results:  Recent Labs  02/03/13 0350 02/04/13 0400  WBC 6.3 7.6  HGB 10.3* 10.3*  HCT 30.7* 30.8*  PLT 191 176   BMET:  Recent Labs  02/02/13 0815 02/02/13 1645  NA 138 137  K 3.8 4.1  CL 97 96  CO2 28 25  GLUCOSE 92 82  BUN 17 22  CREATININE 4.18* 4.93*  CALCIUM 7.9* 8.1*  ALBUMIN 2.6*  2.6*  --    No results found for this basename: PTH,  in the last 72 hours Iron Studies: No results found for this basename: IRON, TIBC, TRANSFERRIN, FERRITIN,  in the last 72 hours  EXAM:  General appearance: Alert, in no apparent distress  Resp: CTA without rales, rhonchi, or wheezes  Cardio: RRR without murmur or rub  GI: + BS, soft and nontender  Extremities: No edema  Access: AVG @ LUA with + bruit   Dialysis Orders: Center: MWF on [place] .  EDW 87.5 kg (might need an increase per op notes) F160 HD Bath 2K/2Ca Time 4:00 Heparin 3000 bolus/2000 mid  Access L AVF BFR 400 DFR A1.5  Hectorol 2 mcg IV/HD Epogen 2000 Units IV/HD Venofer 0  Recent labs: Hgb 9.3, Tsat 35%, P 5.7, PTH 187 in Oct   Assessment/Plan: 1. NSTEMI - elevated troponins, 2D echo with normal LV fxn, EF 55-60%, believed to be demand ischemia from GI bleed; no further chest pain, on Amiodarone, BB. 2. Acute GI bleed - EGD 1/25 showed erosive esophagitis, antral  erosions; resolving bright-red blood in colostomy bag; IV Protonix, Heparin & ASA DC'd. 3. UTI - culture with Klebsiella on Fortaz & MRSA on Vancomycin. 4. ESRD - HD on MWF @ [place].  Next HD tomorrow. 5. HTN/ Volume - BP 133/73 on Metoprolol 50 mg on non-HD days, wt 87.2 kg s/p net UF 2.2 L yesterday. 6. Anemia - Hgb 10.3 s/p 3 U PRBCs 1/24, on Aranesp 100 mcg on Sat. 7. Sec HPT - Ca 8.1 (9.2 corrected), P 3.8; Hectorol 75mcg, Phoslo 3 with meals.  8. Nutrition - Alb 2.6, high protein renal diet & vitamin. 9. Hx Atrial tachycardia - on Amiodarone. 10. Hx Colon Cancer - s/p surgery with colostomy and urostomy in 1986.   LOS: 4 days   LYLES,CHARLES 02/04/2013,10:52 AM  I have seen and examined patient, discussed with PA and agree with assessment and plan as outlined above. Kelly Splinter MD pager [phone]    cell [phone] 02/04/2013, 2:23 PM

## 2013-02-04 NOTE — Discharge Instructions (Signed)
You need to take protonix twice a day for one month after that once a day.

## 2013-02-04 NOTE — Discharge Summary (Signed)
Physician Discharge Summary  [name] NFA:[id number] DOB: [birth year] DOA: 01/31/2013  PCP: Walker Kehr, MD  Admit date: 01/31/2013 Discharge date: 02/04/2013  Time spent: 35 minutes  Recommendations for Outpatient Follow-up:  1. Need cbc to follow hb 2. Need PPI BID for 1 month them daily.   Discharge Diagnoses:    Acute  GI bleed secondary  To antral erosion.    Erosive esophagitis , Antral erosions   NSTEMI (non-ST elevated myocardial infarction)     Acute encephalopathy   Hypertensive heart disease   LBBB   Coronary artery disease   ESRD on dialysis   Type I (juvenile type) diabetes mellitus with renal manifestations, not stated as uncontrolled   S/P transmetatarsal amputation of foot   Chest pain      Discharge Condition: stable.  Diet recommendation: Renal diet.   Filed Weights   02/01/13 2130 02/03/13 1150 02/03/13 1608  Weight: 90.6 kg (199 lb 11.8 oz) 90.2 kg (198 lb 13.7 oz) 87.2 kg (192 lb 3.9 oz)    History of present illness:  [name] is a 68 y.o. female, female, with known history of cardiomyopathy, end-stage renal disease, diastolic congestive heart failure, he presents with complaints of chest pain, described it as a pressure nonradiating, worsened by ambulation and relieved by rest and sitting up, denies any nausea vomiting palpitation dizziness and diaphoresis, pain presented prior to her hemodialysis today, she was given sublingual nitroglycerin with resolution of her chest pain, patient did not get her hemodialysis was sent to ED, in the out patient received 324 mg of by mouth aspirin by EMS, patient troponin were mildly elevated in ED, currently denies any chest pain, the patient troponins are chronically elevated but at a lower level.   Hospital Course:  -N-STEMI, angina; patient presents with chest pain. Troponin positive 0.39. Discontinue heparin Gtt in setting of active GI bleed. Hold aspirin also. Continue with simvastatin, metoprolol  with holder parameters for hypotension. Patient chest pain free at this time. Blood transfusion as needed. ECHO with normal EF. Demand ischemia. No further evaluation per cardio recommendation.   -Acute Gi bleed: Patient S/P Endoscopy that show: Erosive esophagitis , Antral erosions, GI bleeding likely secondary to the above findings in the face of anticoagulation.  Patient with bright red blood colostomy bag. IV heparin stopped. Hb decrease from 9 to 7. Received  IV protonix. S/P 3 units of PRBC.  -Had small amount of blood in colostomy bag l, suspect old blood. HB stable at 10.  -Tolerating diet. Wants to go home. Clear by GI for discharge.  -Discharge on protonix 40 mg BID for 1 month, there after 40 daily.  -Encephalopathy, delirium; patient became agitated, non cooperative 1-24 night. She received Haldol after episode fro agitation.  Neuro exam non focal. LFT NL , ammonia level at 30. CT head negative for acute bleed. Patient back to baseline. Suspect delirium in setting of acute illness.   -UTI per outpatient labs - Cultures from 1/16 indicate Klebsiella finished Fortaz.  and MRSA finished vancomycin.   -ESRD; nephrologist following. -Acute blood loss anemia: in setting of GI bleed. See problem # 2. S/P 3 units of PRBC.  -Atrial Tachycardia; Continue with amiodarone.  -Diabetes: SSI.   Procedures: ECHO;Left ventricle: The cavity size was normal. Wall thickness was increased in a pattern of mild LVH. Systolic function was normal. The estimated ejection fraction was in the range of 55% to 60%. - Aortic valve: Mild regurgitation. - Mitral valve: Mild regurgitation. -  Pulmonary arteries: PA peak pressure: 48mm Hg (S).   Endoscopy: Erosive esophagitis , Antral erosions, GI bleeding likely secondary to the above findings in the face of  anticoagulation.    Consultations:  GI  Renal  Discharge Exam: Filed Vitals:   02/04/13 1419  BP: 136/65  Pulse: 73  Temp: 97.6 F (36.4 C)   Resp: 18    General: no distress.  Cardiovascular: S 1, S 2 RRR Respiratory: CTA Abdomen; BS present, soft, nt, colostomy in place, bag with dark old blood.   Discharge Instructions  Discharge Orders   Future Orders Complete By Expires   Diet - low sodium heart healthy  As directed    Increase activity slowly  As directed        Medication List    STOP taking these medications       aspirin 81 MG chewable tablet      TAKE these medications       amiodarone 200 MG tablet  Commonly known as:  PACERONE  Take 100 mg by mouth daily.     calcium acetate 667 MG capsule  Commonly known as:  PHOSLO  Take 2,001 mg by mouth 3 (three) times daily with meals. Take 2 capsules in the middle of each meal each day     dorzolamide-timolol 22.3-6.8 MG/ML ophthalmic solution  Commonly known as:  COSOPT  Place 1 drop into both eyes 2 (two) times daily.     Fluticasone-Salmeterol 250-50 MCG/DOSE Aepb  Commonly known as:  ADVAIR  Inhale 1 puff into the lungs daily as needed (shortness of breath).     insulin lispro protamine-lispro (50-50) 100 UNIT/ML Susp injection  Commonly known as:  HUMALOG 50/50 MIX  Inject 10 Units into the skin daily with breakfast.     isosorbide mononitrate 60 MG 24 hr tablet  Commonly known as:  IMDUR  Take 1 tablet (60 mg total) by mouth daily.     metoprolol 50 MG tablet  Commonly known as:  LOPRESSOR  Take 50 mg by mouth daily. Daily except on monday, Wednesday, and friday     pantoprazole 40 MG tablet  Commonly known as:  PROTONIX  Take 1 tablet (40 mg total) by mouth 2 (two) times daily.     simvastatin 10 MG tablet  Commonly known as:  ZOCOR  Take 1 tablet (10 mg total) by mouth daily.       Allergies  Allergen Reactions  . Ace Inhibitors Cough  . Eggs Or Egg-Derived Products Nausea And Vomiting  . Enalapril Cough  . Lisinopril Cough  . Omnipaque [Iohexol] Hives       Follow-up Information   Follow up with Walker Kehr, MD In 1  week.   Specialty:  Internal Medicine   Contact information:   [address] [place] [address] [phone]        The results of significant diagnostics from this hospitalization (including imaging, microbiology, ancillary and laboratory) are listed below for reference.    Significant Diagnostic Studies: Ct Head Wo Contrast  02/03/2013   CLINICAL DATA:  Confusion, altered mental status.  EXAM: CT HEAD WITHOUT CONTRAST  TECHNIQUE: Contiguous axial images were obtained from the base of the skull through the vertex without intravenous contrast.  COMPARISON:  05/20/2011  FINDINGS: Since the prior, there has been progression/ increase of subcortical and periventricular white matter hypodensities. Remote left caudate head lacunar infarction. No definite cortical based (large artery) infarction. No  hydrocephalus. No intraparenchymal hemorrhage or abnormal extra-axial fluid collection. The visualized paranasal sinuses and mastoid air cells are predominantly clear. There is mucus/debris within the left sphenoid chamber. Atherosclerotic vascular calcifications.  IMPRESSION: White matter hypodensities are nonspecific, often seen in the setting of chronic microangiopathic change. MRI recommended if there is clinical concern for acute ischemia.  Left sphenoid chamber fluid/debris is mild. Correlate clinically if concerned for sinusitis.   Electronically Signed   By: Carlos Levering M.D.   On: 02/03/2013 04:06   Dg Chest Port 1 View  01/31/2013   CLINICAL DATA:  Chest pressure and short of breath  EXAM: PORTABLE CHEST - 1 VIEW  COMPARISON:  05/28/2012  FINDINGS: Mild cardiomegaly. Low volumes. Basilar atelectasis. Vascular congestion. No pneumothorax.  IMPRESSION: Mild cardiomegaly and vascular congestion with basilar atelectasis.   Electronically Signed   By: Maryclare Bean M.D.   On: 01/31/2013 13:02    Microbiology: Recent Results (from the past 240 hour(s))  MRSA PCR  SCREENING     Status: Abnormal   Collection Time    01/31/13 11:00 PM      Result Value Range Status   MRSA by PCR POSITIVE (*) NEGATIVE Final   Comment:            The GeneXpert MRSA Assay (FDA     approved for NASAL specimens     only), is one component of a     comprehensive MRSA colonization     surveillance program. It is not     intended to diagnose MRSA     infection nor to guide or     monitor treatment for     MRSA infections.     RESULT CALLED TO, READ BACK BY AND VERIFIED WITH:     Clent Ridges [id number] [facility]     Labs: Basic Metabolic Panel:  Recent Labs Lab 01/31/13 1452 02/01/13 0456 02/02/13 0815 02/02/13 1645  NA 144 146 138 137  K 4.6 4.9 3.8 4.1  CL 100 103 97 96  CO2 25 24 28 25   GLUCOSE 89 106* 92 82  BUN 41* 48* 17 22  CREATININE 6.59* 7.63* 4.18* 4.93*  CALCIUM 8.4 7.8* 7.9* 8.1*  PHOS  --   --  3.8  --    Liver Function Tests:  Recent Labs Lab 02/02/13 0815  AST 16  ALT 6  ALKPHOS 92  BILITOT 0.3  PROT 6.5  ALBUMIN 2.6*  2.6*   No results found for this basename: LIPASE, AMYLASE,  in the last 168 hours  Recent Labs Lab 02/02/13 0818  AMMONIA 30   CBC:  Recent Labs Lab 02/01/13 1640 02/02/13 0815 02/02/13 1645 02/02/13 2050 02/03/13 0350 02/04/13 0400  WBC 7.9 6.8 6.2  --  6.3 7.6  HGB 8.0* 9.9* 10.2* 10.2* 10.3* 10.3*  HCT 24.0* 29.2* 29.8* 30.7* 30.7* 30.8*  MCV 89.6 88.2 89.0  --  89.5 90.6  PLT 185 168 174  --  191 176   Cardiac Enzymes:  Recent Labs Lab 01/31/13 1452 01/31/13 2233 02/01/13 0456 02/01/13 1030  TROPONINI 0.31* 0.34* 0.39* 0.61*   BNP: BNP (last 3 results)  Recent Labs  01/31/13 1452  PROBNP 4117.0*   CBG:  Recent Labs Lab 02/03/13 1135 02/03/13 1757 02/03/13 2133 02/04/13 0610 02/04/13 1126  GLUCAP 132* 134* 102* 180* 168*       Signed:  Belanna Manring  [facility] 02/04/2013, 4:20 PM

## 2013-02-04 NOTE — Progress Notes (Signed)
IV and tele monitor d/c at this time; pt to d/c home with daughter; pt wanting to eat dinner at hospital prior to d/c; will await daughter's arrival.

## 2013-02-04 NOTE — Progress Notes (Signed)
Pt tolerated renal diet breakfast and lunch; pt wanting to d/c home; MD paged to make aware; will await callback.

## 2013-02-05 DIAGNOSIS — E1129 Type 2 diabetes mellitus with other diabetic kidney complication: Secondary | ICD-10-CM | POA: Diagnosis not present

## 2013-02-08 DIAGNOSIS — N186 End stage renal disease: Secondary | ICD-10-CM | POA: Diagnosis not present

## 2013-02-10 DIAGNOSIS — D631 Anemia in chronic kidney disease: Secondary | ICD-10-CM | POA: Diagnosis not present

## 2013-02-10 DIAGNOSIS — N2581 Secondary hyperparathyroidism of renal origin: Secondary | ICD-10-CM | POA: Diagnosis not present

## 2013-02-10 DIAGNOSIS — N039 Chronic nephritic syndrome with unspecified morphologic changes: Secondary | ICD-10-CM | POA: Diagnosis not present

## 2013-02-10 DIAGNOSIS — D509 Iron deficiency anemia, unspecified: Secondary | ICD-10-CM | POA: Diagnosis not present

## 2013-02-10 DIAGNOSIS — N186 End stage renal disease: Secondary | ICD-10-CM | POA: Diagnosis not present

## 2013-03-08 DIAGNOSIS — N186 End stage renal disease: Secondary | ICD-10-CM | POA: Diagnosis not present

## 2013-03-10 DIAGNOSIS — N186 End stage renal disease: Secondary | ICD-10-CM | POA: Diagnosis not present

## 2013-03-10 DIAGNOSIS — D631 Anemia in chronic kidney disease: Secondary | ICD-10-CM | POA: Diagnosis not present

## 2013-03-10 DIAGNOSIS — D509 Iron deficiency anemia, unspecified: Secondary | ICD-10-CM | POA: Diagnosis not present

## 2013-03-10 DIAGNOSIS — Z992 Dependence on renal dialysis: Secondary | ICD-10-CM | POA: Diagnosis not present

## 2013-03-10 DIAGNOSIS — N2581 Secondary hyperparathyroidism of renal origin: Secondary | ICD-10-CM | POA: Diagnosis not present

## 2013-03-10 DIAGNOSIS — E119 Type 2 diabetes mellitus without complications: Secondary | ICD-10-CM | POA: Diagnosis not present

## 2013-03-14 ENCOUNTER — Encounter: Payer: Self-pay | Admitting: Internal Medicine

## 2013-04-08 DIAGNOSIS — N186 End stage renal disease: Secondary | ICD-10-CM | POA: Diagnosis not present

## 2013-04-09 DIAGNOSIS — D509 Iron deficiency anemia, unspecified: Secondary | ICD-10-CM | POA: Diagnosis not present

## 2013-04-09 DIAGNOSIS — N390 Urinary tract infection, site not specified: Secondary | ICD-10-CM | POA: Diagnosis not present

## 2013-04-09 DIAGNOSIS — N039 Chronic nephritic syndrome with unspecified morphologic changes: Secondary | ICD-10-CM | POA: Diagnosis not present

## 2013-04-09 DIAGNOSIS — D631 Anemia in chronic kidney disease: Secondary | ICD-10-CM | POA: Diagnosis not present

## 2013-04-09 DIAGNOSIS — Z992 Dependence on renal dialysis: Secondary | ICD-10-CM | POA: Diagnosis not present

## 2013-04-09 DIAGNOSIS — N186 End stage renal disease: Secondary | ICD-10-CM | POA: Diagnosis not present

## 2013-04-09 DIAGNOSIS — E119 Type 2 diabetes mellitus without complications: Secondary | ICD-10-CM | POA: Diagnosis not present

## 2013-04-09 DIAGNOSIS — N2581 Secondary hyperparathyroidism of renal origin: Secondary | ICD-10-CM | POA: Diagnosis not present

## 2013-05-08 DIAGNOSIS — N186 End stage renal disease: Secondary | ICD-10-CM | POA: Diagnosis not present

## 2013-05-09 DIAGNOSIS — N186 End stage renal disease: Secondary | ICD-10-CM | POA: Diagnosis not present

## 2013-05-09 DIAGNOSIS — E119 Type 2 diabetes mellitus without complications: Secondary | ICD-10-CM | POA: Diagnosis not present

## 2013-05-09 DIAGNOSIS — D509 Iron deficiency anemia, unspecified: Secondary | ICD-10-CM | POA: Diagnosis not present

## 2013-05-09 DIAGNOSIS — D631 Anemia in chronic kidney disease: Secondary | ICD-10-CM | POA: Diagnosis not present

## 2013-05-09 DIAGNOSIS — E1129 Type 2 diabetes mellitus with other diabetic kidney complication: Secondary | ICD-10-CM | POA: Diagnosis not present

## 2013-05-09 DIAGNOSIS — N2581 Secondary hyperparathyroidism of renal origin: Secondary | ICD-10-CM | POA: Diagnosis not present

## 2013-05-09 DIAGNOSIS — Z992 Dependence on renal dialysis: Secondary | ICD-10-CM | POA: Diagnosis not present

## 2013-05-28 DIAGNOSIS — E1129 Type 2 diabetes mellitus with other diabetic kidney complication: Secondary | ICD-10-CM | POA: Diagnosis not present

## 2013-06-08 DIAGNOSIS — N186 End stage renal disease: Secondary | ICD-10-CM | POA: Diagnosis not present

## 2013-06-09 DIAGNOSIS — N186 End stage renal disease: Secondary | ICD-10-CM | POA: Diagnosis not present

## 2013-06-09 DIAGNOSIS — N2581 Secondary hyperparathyroidism of renal origin: Secondary | ICD-10-CM | POA: Diagnosis not present

## 2013-06-09 DIAGNOSIS — N390 Urinary tract infection, site not specified: Secondary | ICD-10-CM | POA: Diagnosis not present

## 2013-06-09 DIAGNOSIS — D631 Anemia in chronic kidney disease: Secondary | ICD-10-CM | POA: Diagnosis not present

## 2013-06-09 DIAGNOSIS — Z992 Dependence on renal dialysis: Secondary | ICD-10-CM | POA: Diagnosis not present

## 2013-06-09 DIAGNOSIS — D509 Iron deficiency anemia, unspecified: Secondary | ICD-10-CM | POA: Diagnosis not present

## 2013-06-09 DIAGNOSIS — E119 Type 2 diabetes mellitus without complications: Secondary | ICD-10-CM | POA: Diagnosis not present

## 2013-06-19 DIAGNOSIS — E1139 Type 2 diabetes mellitus with other diabetic ophthalmic complication: Secondary | ICD-10-CM | POA: Diagnosis not present

## 2013-06-19 DIAGNOSIS — H4089 Other specified glaucoma: Secondary | ICD-10-CM | POA: Diagnosis not present

## 2013-06-19 DIAGNOSIS — E11359 Type 2 diabetes mellitus with proliferative diabetic retinopathy without macular edema: Secondary | ICD-10-CM | POA: Diagnosis not present

## 2013-06-23 DIAGNOSIS — N390 Urinary tract infection, site not specified: Secondary | ICD-10-CM | POA: Diagnosis not present

## 2013-07-03 DIAGNOSIS — I739 Peripheral vascular disease, unspecified: Secondary | ICD-10-CM | POA: Diagnosis not present

## 2013-07-03 DIAGNOSIS — L608 Other nail disorders: Secondary | ICD-10-CM | POA: Diagnosis not present

## 2013-07-03 DIAGNOSIS — E1059 Type 1 diabetes mellitus with other circulatory complications: Secondary | ICD-10-CM | POA: Diagnosis not present

## 2013-07-08 DIAGNOSIS — N186 End stage renal disease: Secondary | ICD-10-CM | POA: Diagnosis not present

## 2013-07-09 DIAGNOSIS — E119 Type 2 diabetes mellitus without complications: Secondary | ICD-10-CM | POA: Diagnosis not present

## 2013-07-09 DIAGNOSIS — N2581 Secondary hyperparathyroidism of renal origin: Secondary | ICD-10-CM | POA: Diagnosis not present

## 2013-07-09 DIAGNOSIS — Z992 Dependence on renal dialysis: Secondary | ICD-10-CM | POA: Diagnosis not present

## 2013-07-09 DIAGNOSIS — D631 Anemia in chronic kidney disease: Secondary | ICD-10-CM | POA: Diagnosis not present

## 2013-07-09 DIAGNOSIS — D509 Iron deficiency anemia, unspecified: Secondary | ICD-10-CM | POA: Diagnosis not present

## 2013-07-09 DIAGNOSIS — N186 End stage renal disease: Secondary | ICD-10-CM | POA: Diagnosis not present

## 2013-07-09 DIAGNOSIS — R000 Tachycardia, unspecified: Secondary | ICD-10-CM | POA: Diagnosis not present

## 2013-07-30 DIAGNOSIS — E1129 Type 2 diabetes mellitus with other diabetic kidney complication: Secondary | ICD-10-CM | POA: Diagnosis not present

## 2013-08-05 ENCOUNTER — Encounter: Payer: Self-pay | Admitting: Internal Medicine

## 2013-08-05 ENCOUNTER — Ambulatory Visit (INDEPENDENT_AMBULATORY_CARE_PROVIDER_SITE_OTHER): Payer: Medicare Other | Admitting: Internal Medicine

## 2013-08-05 VITALS — BP 120/78 | HR 90 | Ht 60.0 in | Wt 200.0 lb

## 2013-08-05 DIAGNOSIS — I498 Other specified cardiac arrhythmias: Secondary | ICD-10-CM

## 2013-08-05 DIAGNOSIS — I2510 Atherosclerotic heart disease of native coronary artery without angina pectoris: Secondary | ICD-10-CM

## 2013-08-05 DIAGNOSIS — I471 Supraventricular tachycardia: Secondary | ICD-10-CM

## 2013-08-05 NOTE — Progress Notes (Signed)
Patient Care Team: Cassandria Anger, MD as PCP - General Louis Meckel, MD (Nephrology) Deboraha Sprang, MD (Cardiology) Renato Shin, MD as Attending Physician (Internal Medicine) Gatha Mayer, MD as Consulting Physician (Gastroenterology)   HPI  [name] is a 68 y.o. female Seen in followup for atrial tachycardia treated and responsive to amiodarone.  She has end-stage renal disease and is on dialysis.  Remotely she had an echo that demonstrated severe left ventricular dysfunction at about 25%. She developed recurrent tachycardia in December 2010 with a non-STEMI. Catheterization demonstrated 60% mid RCA; subtotal occlusion of the posterior descending 70 AV circ disease and a 30% proximal LAD. She also had distal disease in her diagonal and her LAD.  Ejection fraction by echocardiogram 1/15 was 55-60%. Echo description also however, suggests severe mitral stenosis. However, she has no symptoms of dyspnea or orthopnea. She has chronic left bundle branch block   He was hospitalized 1/13 with significant bleeding borderline positive troponins  She is in need of amiodarone surveillance laboratories   Past Medical History  Diagnosis Date  . Hyperlipidemia   . Mitral valve insufficiency and aortic valve insufficiency   . Cardiomyopathy- mixed   . 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 Chronic diastolic heart failure   . Anemia   . Coronary artery disease     Previously decreased EF; echo 113 normal LV function  . Hernia, incisional     abd  . History of colon cancer     history of colon cancer.  1986  . Arthritis   . LBBB (left bundle branch block)   . Atrial tachycardia     on amiod  . Hypertensive heart disease     sees Dr. Alain Marion  . Stroke     2011/12  . Renal failure     ESRD, Dr Dunham/Dr. Lyda Kalata.  M, W, Fr  . Obesity     Past Surgical History  Procedure Laterality Date  .  Colostomy  1986  . Revision urostomy cutaneous    . Esophagogastroduodenoscopy  03-18-04  . Electrocardiogram  04-27-06  . Placement of new left forearm arteriovenous graft  03-11-08  . Left heart catheterization and right heart catheterization  12-10    R. heart cath showed elevated left and right heart filling pressures w/ pulmonary artery pressure elevated mildly out of proportion to the wedge. The left heart cath showed diffuse distal vessel disease as well as a 75% stenosis in the mid circumflex w/ a 90% stenosis of the ostial first obtuse marginal. These lesions were in close proximity. there was a 60-70%mild RCA stenosis.   . Arteriovenous graft placement  2010  . 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 Cardiac catheterization    . Eye surgery      Cataract Left  . Pars plana vitrectomy  11/29/2011    Procedure: PARS PLANA VITRECTOMY WITH 23 GAUGE;  Surgeon: Adonis Brook, MD;  Location: [place];  Service: Ophthalmology;  Laterality: Right;  Right Eye 23 ga vitrectomy with membrane peel  . Pars plana vitrectomy Left 02/28/2012    Procedure: PARS PLANA VITRECTOMY WITH 23 GAUGE;  Surgeon: Adonis Brook, MD;  Location: [place];  Service: Ophthalmology;  Laterality: Left;  . Esophagogastroduodenoscopy N/A 02/02/2013    Procedure: ESOPHAGOGASTRODUODENOSCOPY (EGD);  Surgeon: Irene Shipper, MD;  Location[facility] END[facility];  Service: Endoscopy;  Laterality: N/A;    Current Outpatient Prescriptions  Medication Sig Dispense Refill  . amiodarone (PACERONE) 200 MG tablet Take 100 mg by mouth daily.      . calcium acetate (PHOSLO) 667 MG capsule Take 2,001 mg by mouth 3 (three) times daily with meals. Take 2 capsules in the middle of each meal each day      . dorzolamide-timolol (COSOPT) 22.3-6.8 MG/ML ophthalmic solution Place 1 drop into both eyes 2 (two) times daily.       . Fluticasone-Salmeterol (ADVAIR) 250-50 MCG/DOSE AEPB Inhale 1 puff into the lungs daily as needed  (shortness of breath).       . insulin lispro protamine-lispro (HUMALOG 50/50 MIX) (50-50) 100 UNIT/ML SUSP injection Inject 10 Units into the skin daily with breakfast.  10 mL  11  . isosorbide mononitrate (IMDUR) 60 MG 24 hr tablet Take 1 tablet (60 mg total) by mouth daily.  90 tablet  3  . metoprolol (LOPRESSOR) 50 MG tablet Take 50 mg by mouth daily. Daily except on monday, Wednesday, and friday      . simvastatin (ZOCOR) 10 MG tablet Take 1 tablet (10 mg total) by mouth daily.  90 tablet  3   No current facility-administered medications for this visit.    Allergies  Allergen Reactions  . Ace Inhibitors Cough  . Eggs Or Egg-Derived Products Nausea And Vomiting  . Enalapril Cough  . Lisinopril Cough  . Omnipaque [Iohexol] Hives    Review of Systems negative except from HPI and PMH  Physical Exam BP 120/78  Pulse 90  Ht 5' (1.524 m)  Wt 200 lb (90.719 kg)  BMI 39.06 kg/m2 Well developed and well nourished in no acute distress HENT normal E scleral and icterus clear Neck Supple JVP flat; carotids brisk and full Clear to ausculation  Regular rate and rhythm,  2/6 systolic murmur with a split S2 Soft with active bowel sounds No clubbing cyanosis  Edema Alert and oriented, grossly normal motor and sensory function Skin Warm and Dry   ECG demonstrates sinus rhythm at 90 Intervals 21/16/44   Assessment and  Plan  Atrial tachycardia   Amiodarone therapy-chronic   Aortic stenosis-mild   Mitral stenosis??   She is doing exceptionally well. We'll check amiodarone surveillance laboratories. She's had no symptomatic palpitations.   Her echo report is corn from her symptoms. We will have her reviewed.

## 2013-08-05 NOTE — Patient Instructions (Signed)
Your physician recommends that you continue on your current medications as directed. Please refer to the Current Medication list given to you today.  Please have Amiodarone surveillance labs drawn at dialysis. (handwritten prescription given)  Your physician wants you to follow-up in: 6 months with Dr. Caryl Comes. You will receive a reminder letter in the mail two months in advance. If you don't receive a letter, please call our office to schedule the follow-up appointment.

## 2013-08-07 DIAGNOSIS — E1139 Type 2 diabetes mellitus with other diabetic ophthalmic complication: Secondary | ICD-10-CM | POA: Diagnosis not present

## 2013-08-07 DIAGNOSIS — E11359 Type 2 diabetes mellitus with proliferative diabetic retinopathy without macular edema: Secondary | ICD-10-CM | POA: Diagnosis not present

## 2013-08-07 DIAGNOSIS — H4089 Other specified glaucoma: Secondary | ICD-10-CM | POA: Diagnosis not present

## 2013-08-07 DIAGNOSIS — H4310 Vitreous hemorrhage, unspecified eye: Secondary | ICD-10-CM | POA: Diagnosis not present

## 2013-08-08 DIAGNOSIS — I4891 Unspecified atrial fibrillation: Secondary | ICD-10-CM | POA: Diagnosis not present

## 2013-08-08 DIAGNOSIS — N186 End stage renal disease: Secondary | ICD-10-CM | POA: Diagnosis not present

## 2013-08-11 DIAGNOSIS — N039 Chronic nephritic syndrome with unspecified morphologic changes: Secondary | ICD-10-CM | POA: Diagnosis not present

## 2013-08-11 DIAGNOSIS — E1129 Type 2 diabetes mellitus with other diabetic kidney complication: Secondary | ICD-10-CM | POA: Diagnosis not present

## 2013-08-11 DIAGNOSIS — N186 End stage renal disease: Secondary | ICD-10-CM | POA: Diagnosis not present

## 2013-08-11 DIAGNOSIS — E119 Type 2 diabetes mellitus without complications: Secondary | ICD-10-CM | POA: Diagnosis not present

## 2013-08-11 DIAGNOSIS — N2581 Secondary hyperparathyroidism of renal origin: Secondary | ICD-10-CM | POA: Diagnosis not present

## 2013-08-11 DIAGNOSIS — D509 Iron deficiency anemia, unspecified: Secondary | ICD-10-CM | POA: Diagnosis not present

## 2013-08-11 DIAGNOSIS — D631 Anemia in chronic kidney disease: Secondary | ICD-10-CM | POA: Diagnosis not present

## 2013-08-11 DIAGNOSIS — Z992 Dependence on renal dialysis: Secondary | ICD-10-CM | POA: Diagnosis not present

## 2013-08-12 DIAGNOSIS — E11359 Type 2 diabetes mellitus with proliferative diabetic retinopathy without macular edema: Secondary | ICD-10-CM | POA: Diagnosis not present

## 2013-08-12 DIAGNOSIS — H409 Unspecified glaucoma: Secondary | ICD-10-CM | POA: Diagnosis not present

## 2013-08-12 DIAGNOSIS — Z9849 Cataract extraction status, unspecified eye: Secondary | ICD-10-CM | POA: Diagnosis not present

## 2013-08-12 DIAGNOSIS — H4011X0 Primary open-angle glaucoma, stage unspecified: Secondary | ICD-10-CM | POA: Diagnosis not present

## 2013-08-12 DIAGNOSIS — E11311 Type 2 diabetes mellitus with unspecified diabetic retinopathy with macular edema: Secondary | ICD-10-CM | POA: Diagnosis not present

## 2013-08-13 DIAGNOSIS — Z992 Dependence on renal dialysis: Secondary | ICD-10-CM | POA: Diagnosis not present

## 2013-08-13 DIAGNOSIS — D631 Anemia in chronic kidney disease: Secondary | ICD-10-CM | POA: Diagnosis not present

## 2013-08-13 DIAGNOSIS — E119 Type 2 diabetes mellitus without complications: Secondary | ICD-10-CM | POA: Diagnosis not present

## 2013-08-13 DIAGNOSIS — N2581 Secondary hyperparathyroidism of renal origin: Secondary | ICD-10-CM | POA: Diagnosis not present

## 2013-08-13 DIAGNOSIS — D509 Iron deficiency anemia, unspecified: Secondary | ICD-10-CM | POA: Diagnosis not present

## 2013-08-13 DIAGNOSIS — N186 End stage renal disease: Secondary | ICD-10-CM | POA: Diagnosis not present

## 2013-08-14 DIAGNOSIS — H1130 Conjunctival hemorrhage, unspecified eye: Secondary | ICD-10-CM | POA: Diagnosis not present

## 2013-08-14 DIAGNOSIS — H409 Unspecified glaucoma: Secondary | ICD-10-CM | POA: Diagnosis not present

## 2013-08-15 DIAGNOSIS — D631 Anemia in chronic kidney disease: Secondary | ICD-10-CM | POA: Diagnosis not present

## 2013-08-15 DIAGNOSIS — N186 End stage renal disease: Secondary | ICD-10-CM | POA: Diagnosis not present

## 2013-08-15 DIAGNOSIS — N2581 Secondary hyperparathyroidism of renal origin: Secondary | ICD-10-CM | POA: Diagnosis not present

## 2013-08-15 DIAGNOSIS — Z992 Dependence on renal dialysis: Secondary | ICD-10-CM | POA: Diagnosis not present

## 2013-08-15 DIAGNOSIS — E119 Type 2 diabetes mellitus without complications: Secondary | ICD-10-CM | POA: Diagnosis not present

## 2013-08-15 DIAGNOSIS — D509 Iron deficiency anemia, unspecified: Secondary | ICD-10-CM | POA: Diagnosis not present

## 2013-08-18 DIAGNOSIS — E119 Type 2 diabetes mellitus without complications: Secondary | ICD-10-CM | POA: Diagnosis not present

## 2013-08-18 DIAGNOSIS — D631 Anemia in chronic kidney disease: Secondary | ICD-10-CM | POA: Diagnosis not present

## 2013-08-18 DIAGNOSIS — N2581 Secondary hyperparathyroidism of renal origin: Secondary | ICD-10-CM | POA: Diagnosis not present

## 2013-08-18 DIAGNOSIS — N186 End stage renal disease: Secondary | ICD-10-CM | POA: Diagnosis not present

## 2013-08-18 DIAGNOSIS — D509 Iron deficiency anemia, unspecified: Secondary | ICD-10-CM | POA: Diagnosis not present

## 2013-08-18 DIAGNOSIS — Z992 Dependence on renal dialysis: Secondary | ICD-10-CM | POA: Diagnosis not present

## 2013-08-19 ENCOUNTER — Other Ambulatory Visit: Payer: Self-pay | Admitting: Ophthalmology

## 2013-08-19 ENCOUNTER — Ambulatory Visit: Payer: Medicare Other | Admitting: Internal Medicine

## 2013-08-19 ENCOUNTER — Encounter (HOSPITAL_COMMUNITY): Payer: Self-pay | Admitting: *Deleted

## 2013-08-19 MED ORDER — TETRACAINE HCL 0.5 % OP SOLN: 1.0000 [drp] | OPHTHALMIC | Status: DC

## 2013-08-19 MED ORDER — MUPIROCIN 2 % EX OINT
1.0000 "application " | TOPICAL_OINTMENT | Freq: Once | CUTANEOUS | Status: AC
  Administered 2013-08-20: 1 via TOPICAL

## 2013-08-19 NOTE — Progress Notes (Signed)
Pt has dialysis in the morning. Will come here as soon as she is done.

## 2013-08-19 NOTE — H&P (Signed)
History & Physical:   DATE:   08-19-13  NAME:  [name], [name]     [id number]       HISTORY OF PRESENT ILLNESS: Dialysis M/W/F  Dr Ricki Miller former pt    Chief Eye Complaints   Diabetic/Glaucoma: Patient here for an IOP check after adding Alphagan P TID.  Va not good . Patient has been complaint with gtts. IDDM x1980, BS 121mg % after midnight. HA1C 7.5 back in Feb/Mar.   HPI: EYES: Reports symptoms of vision disturbances.        LOCATION:   RIGHT EYE        QUALITY/COURSE:   Reports condition is worsening.        INTENSITY/SEVERITY:    Reports measurement ( or degree) as moderate.      DURATION:   Reports the general length of symptoms to be months.                    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 CE w/  intraocular lens implant  OU (Dr. Anderson Malta) 06/22/11 PANRETINAL PHOTOCOAGULATION  OS, 11/2009 PPV,MP,EL OU Pick List - Surgeries  MEDICATIONS: Alphagan P: 0.1% solution SIG-  1 drop in both eyes three times a day    Lumigan: Strength-  SIG-  1 gtt in each affected eye once a day (in the evening) for 30 days  OU  Cosopt  (Dorzolamide-Hydrochloride-Timolol Mal):    2.23%-0.68% solution    SIG-   1 milliliter(s)  drop     2 times a day              Humalog (Insulin Lispro):  100 units/mL (injection)  SIG-   as directed    as directed  REVIEW OF SYSTEMS:  ROS:   GEN- Constitutional: HENT: GEN - Endocrine: Reports symptoms of diabetes.    LUNGS/Respiratory:  HEART/Cardiovascular: Reports symptoms of hypertension.    ABD/Gastrointestinal:  Musculoskeletal (BJE)     arthralgias    osteoarthritis/osteoarthrosis   NEURO/Neurological: PSYCH/Psychiatric:    Is the pt oriented to time, place, person? Yes Mood normal  agitated  TOBACCO: Smoker Status:   Never smoker   ICD10: Z87.898 ICD9: V13.89 Onset: 08/07/2013 09:12  SOCIAL HISTORY: retired   FAMILY HISTORY: Family History - 1st Degree Relatives:  Daughter alive and well.    ALLERGIES: Fluorescein Containing Compounds Severity-  Onset-  Reaction-  Status- Active Type- Drug allergy Date Changed-  HIVES Starter - Allergies - Summary:  PHYSICAL EXAMINATION:  VS: BP: 115/57.  P: 79 /min.  RR: 20 /min.  W: 200lbs 0oz.   VS: BMI: 30.0.  BP: 151/76.  H: 68.50 in.  P: 87 /min.  W: 200lbs 0oz.    Va     OD: cc CF @ 3' OS: cc 20/40  EYEGLASSES: 2015 OD: -1.00 +1.75 x169 OS: -1.50 +1.00 x011 ADD:+2.50  MR  08/07/2013 09:13  OD: -1.00 +1.75 x169 NI OS:  -1.75 +1.25 x011 20/50 ADD  K's OD: 45.75 46.50 OS: 45.50 46.00  VF:   OD: decrease vision in all four quadrants OS: full in all four quadrants  Motility full  PUPILS: 36mm reactive OU  EYELIDS & OCULAR ADNEXA normal    SLE: Conjunctiva: OD: 1+ injection Sub Conj inferior and superior     OS Quiet  Cornea:  Arcus &   Decreased Tear Break-Up Time OU,     anterior chamber   HD:IXBO with +1 cell and flare  OS: Deep with trace flare 0 cell   Iris Brown OU  positive rubeosis OD     OS trace peripapillary rubeosis   Lens  posterior chamber  intraocular lens implant  OU   Ta   in mmHg    OD  35        OS 24 Time08/11/2013 11:34   Gonio OD angle open 360 degrees with Rubeosis throughout TM  OS angle open 360 degrees to  scleral spur, TM +2 pigment     Dilation:No dilation EIOP07/30/2015 11:21   Fundus:  optic nerve   OD: pale temporal 55% cupping  Poor view                                                 OS: temporal pallor intact   Macula       OD                                                     OS poor view  Vessels  Periphery:  PANRETINAL PHOTOCOAGULATION Scars OU   Humphery visual field  OD double bjerrum scatoma extends into fixation with small island of vision  OS double nasal step, double arcuate defects      Exam: GENERAL: Appearance: HEAD, EARS, NOSE AND THROAT: Ears-Nose (external) Inspection: Externally, nose and ears are normal in appearance and without scars, lesions, or nodules.      Hearing assessment shows no problems with normal conversation.      LUNGS and RESPIRATORY: Lung auscultation elicits no wheezing, rhonci, rales or rubs and with equal breath sounds.    Respiratory effort described as breathing is unlabored and chest movement is symmetrical.    HEART (Cardiovascular): Heart auscultation discovers regular rate and rhythm; no murmur, gallop or rub. Normal heart sounds.    ABDOMEN (Gastrointestinal): Mass/Tenderness Exam: Neither are present.     MUSCULOSKELETAL (BJE): Inspection-Palpation: No major bone, joint, tendon, or muscle changes.      NEUROLOGICAL: Alert and oriented. No major deficits of coordination or sensation.      PSYCHIATRIC: Insight and judgment appear  both to be intact and appropriate.    Mood and affect are described as normal mood and full affect.  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        Actions:  Dilated fundus eval done Performed Date/Time: 08/07/2013 11:28 TDV#[id number] Ordering Provider: Marylynn Pearson, Brooke Bonito Related Dxs-  Order Due:  Consultant:  Modifiers-    DILATED MACULAR/FUNDUS Suzie Portela [place] PHYS/QHP Performed Date/Time: 08/07/2013 11:28 CPT#5010F Ordering Provider: Marylynn Pearson, Brooke Bonito. Related Dxs-  Order Due:  Consultant:  Modifiers-       Handouts: glaucoma , what is glaucoma?, glaucoma treatment, Beta-Blockers, glaucoma , what is glaucoma?, glaucoma treatment.    ___________________________ Marylynn Pearson, Brooke Bonito. Colon cancer   ICD10:  C18.9  ICD9: 153.9  Onset: 08/07/2013 09:28  Resolved: 08/07/2013  09:28  1986   Starter - Inactive Problems:

## 2013-08-20 ENCOUNTER — Encounter (HOSPITAL_COMMUNITY)
Admission: RE | Disposition: A | Discharge status: Home / Self Care | Payer: Self-pay | Source: Ambulatory Visit | Attending: Ophthalmology

## 2013-08-20 ENCOUNTER — Ambulatory Visit (HOSPITAL_COMMUNITY): Payer: Medicare Other | Admitting: Anesthesiology

## 2013-08-20 ENCOUNTER — Ambulatory Visit (HOSPITAL_COMMUNITY)
Admission: RE | Admit: 2013-08-20 | Discharge: 2013-08-20 | Disposition: A | Discharge status: Home / Self Care | Payer: Medicare Other | Source: Ambulatory Visit | Attending: Ophthalmology | Admitting: Ophthalmology

## 2013-08-20 ENCOUNTER — Ambulatory Visit (HOSPITAL_COMMUNITY): Payer: Medicare Other

## 2013-08-20 ENCOUNTER — Encounter (HOSPITAL_COMMUNITY): Payer: Medicare Other | Admitting: Anesthesiology

## 2013-08-20 ENCOUNTER — Encounter (HOSPITAL_COMMUNITY): Payer: Self-pay | Admitting: Surgery

## 2013-08-20 DIAGNOSIS — Z992 Dependence on renal dialysis: Secondary | ICD-10-CM | POA: Diagnosis not present

## 2013-08-20 DIAGNOSIS — D509 Iron deficiency anemia, unspecified: Secondary | ICD-10-CM | POA: Diagnosis not present

## 2013-08-20 DIAGNOSIS — I120 Hypertensive chronic kidney disease with stage 5 chronic kidney disease or end stage renal disease: Secondary | ICD-10-CM | POA: Insufficient documentation

## 2013-08-20 DIAGNOSIS — I2510 Atherosclerotic heart disease of native coronary artery without angina pectoris: Secondary | ICD-10-CM | POA: Insufficient documentation

## 2013-08-20 DIAGNOSIS — I517 Cardiomegaly: Secondary | ICD-10-CM | POA: Diagnosis not present

## 2013-08-20 DIAGNOSIS — H1130 Conjunctival hemorrhage, unspecified eye: Secondary | ICD-10-CM | POA: Diagnosis not present

## 2013-08-20 DIAGNOSIS — D631 Anemia in chronic kidney disease: Secondary | ICD-10-CM | POA: Diagnosis not present

## 2013-08-20 DIAGNOSIS — Z794 Long term (current) use of insulin: Secondary | ICD-10-CM | POA: Insufficient documentation

## 2013-08-20 DIAGNOSIS — H4089 Other specified glaucoma: Secondary | ICD-10-CM | POA: Diagnosis not present

## 2013-08-20 DIAGNOSIS — Z961 Presence of intraocular lens: Secondary | ICD-10-CM | POA: Insufficient documentation

## 2013-08-20 DIAGNOSIS — I252 Old myocardial infarction: Secondary | ICD-10-CM | POA: Insufficient documentation

## 2013-08-20 DIAGNOSIS — E119 Type 2 diabetes mellitus without complications: Secondary | ICD-10-CM | POA: Diagnosis not present

## 2013-08-20 DIAGNOSIS — N186 End stage renal disease: Secondary | ICD-10-CM | POA: Insufficient documentation

## 2013-08-20 DIAGNOSIS — N2581 Secondary hyperparathyroidism of renal origin: Secondary | ICD-10-CM | POA: Diagnosis not present

## 2013-08-20 DIAGNOSIS — E1139 Type 2 diabetes mellitus with other diabetic ophthalmic complication: Secondary | ICD-10-CM | POA: Insufficient documentation

## 2013-08-20 DIAGNOSIS — M1990 Unspecified osteoarthritis, unspecified site: Secondary | ICD-10-CM | POA: Insufficient documentation

## 2013-08-20 DIAGNOSIS — E11359 Type 2 diabetes mellitus with proliferative diabetic retinopathy without macular edema: Secondary | ICD-10-CM | POA: Diagnosis not present

## 2013-08-20 DIAGNOSIS — H409 Unspecified glaucoma: Secondary | ICD-10-CM | POA: Insufficient documentation

## 2013-08-20 DIAGNOSIS — H402290 Chronic angle-closure glaucoma, unspecified eye, stage unspecified: Secondary | ICD-10-CM | POA: Diagnosis not present

## 2013-08-20 HISTORY — DX: Unspecified glaucoma: H40.9

## 2013-08-20 HISTORY — PX: INSERTION OF AHMED VALVE: SHX6254

## 2013-08-20 HISTORY — DX: Essential (primary) hypertension: I10

## 2013-08-20 LAB — POCT I-STAT 4, (NA,K, GLUC, HGB,HCT)
GLUCOSE: 121 mg/dL — AB (ref 70–99)
HEMATOCRIT: 42 % (ref 36.0–46.0)
Hemoglobin: 14.3 g/dL (ref 12.0–15.0)
Potassium: 3.4 mEq/L — ABNORMAL LOW (ref 3.7–5.3)
SODIUM: 139 meq/L (ref 137–147)

## 2013-08-20 LAB — GLUCOSE, CAPILLARY
GLUCOSE-CAPILLARY: 101 mg/dL — AB (ref 70–99)
Glucose-Capillary: 100 mg/dL — ABNORMAL HIGH (ref 70–99)

## 2013-08-20 LAB — SURGICAL PCR SCREEN
MRSA, PCR: POSITIVE — AB
STAPHYLOCOCCUS AUREUS: POSITIVE — AB

## 2013-08-20 SURGERY — INSERTION, GLAUCOMA VALVE, AHMED
Anesthesia: Monitor Anesthesia Care | Site: Eye | Laterality: Right

## 2013-08-20 MED ORDER — TOBRAMYCIN 0.3 % OP OINT
TOPICAL_OINTMENT | OPHTHALMIC | Status: DC | PRN
  Administered 2013-08-20: 1 via OPHTHALMIC

## 2013-08-20 MED ORDER — FENTANYL CITRATE 0.05 MG/ML IJ SOLN
INTRAMUSCULAR | Status: AC
  Filled 2013-08-20: qty 5

## 2013-08-20 MED ORDER — BSS IO SOLN
INTRAOCULAR | Status: DC | PRN
  Administered 2013-08-20: 15 mL
  Administered 2013-08-20: 500 mL

## 2013-08-20 MED ORDER — TOBRAMYCIN-DEXAMETHASONE 0.3-0.1 % OP OINT
TOPICAL_OINTMENT | OPHTHALMIC | Status: AC
  Filled 2013-08-20: qty 3.5

## 2013-08-20 MED ORDER — MIDAZOLAM HCL 5 MG/5ML IJ SOLN
INTRAMUSCULAR | Status: DC | PRN
  Administered 2013-08-20: 1 mg via INTRAVENOUS

## 2013-08-20 MED ORDER — MIDAZOLAM HCL 2 MG/2ML IJ SOLN
INTRAMUSCULAR | Status: AC
  Filled 2013-08-20: qty 2

## 2013-08-20 MED ORDER — ATROPINE SULFATE 1 % OP SOLN
OPHTHALMIC | Status: AC
  Filled 2013-08-20: qty 2

## 2013-08-20 MED ORDER — TETRACAINE HCL 0.5 % OP SOLN
OPHTHALMIC | Status: AC
  Filled 2013-08-20: qty 2

## 2013-08-20 MED ORDER — LIDOCAINE-EPINEPHRINE 2 %-1:100000 IJ SOLN
INTRAMUSCULAR | Status: DC | PRN
  Administered 2013-08-20: 16:00:00 via RETROBULBAR

## 2013-08-20 MED ORDER — GATIFLOXACIN 0.5 % OP SOLN
1.0000 [drp] | OPHTHALMIC | Status: AC
  Administered 2013-08-20 (×3): 1 [drp] via OPHTHALMIC
  Filled 2013-08-20: qty 2.5

## 2013-08-20 MED ORDER — ONDANSETRON HCL 4 MG/2ML IJ SOLN
INTRAMUSCULAR | Status: DC | PRN
  Administered 2013-08-20: 4 mg via INTRAVENOUS

## 2013-08-20 MED ORDER — TRIAMCINOLONE ACETONIDE 40 MG/ML IJ SUSP
INTRAMUSCULAR | Status: DC | PRN
  Administered 2013-08-20: .1 mL

## 2013-08-20 MED ORDER — ONDANSETRON HCL 4 MG/2ML IJ SOLN
INTRAMUSCULAR | Status: AC
  Filled 2013-08-20: qty 2

## 2013-08-20 MED ORDER — SODIUM HYALURONATE 10 MG/ML IO SOLN
INTRAOCULAR | Status: AC
  Filled 2013-08-20: qty 0.85

## 2013-08-20 MED ORDER — FENTANYL CITRATE 0.05 MG/ML IJ SOLN
INTRAMUSCULAR | Status: DC | PRN
  Administered 2013-08-20 (×2): 25 ug via INTRAVENOUS

## 2013-08-20 MED ORDER — BUPIVACAINE HCL (PF) 0.75 % IJ SOLN
INTRAMUSCULAR | Status: AC
  Filled 2013-08-20: qty 10

## 2013-08-20 MED ORDER — SODIUM HYALURONATE 10 MG/ML IO SOLN
INTRAOCULAR | Status: DC | PRN
  Administered 2013-08-20: 0.85 mL via INTRAOCULAR

## 2013-08-20 MED ORDER — FLUORESCEIN SODIUM 1 MG OP STRP
ORAL_STRIP | OPHTHALMIC | Status: AC
  Filled 2013-08-20: qty 1

## 2013-08-20 MED ORDER — LIDOCAINE HCL 2 % IJ SOLN
INTRAMUSCULAR | Status: AC
  Filled 2013-08-20: qty 20

## 2013-08-20 MED ORDER — TRIAMCINOLONE ACETONIDE 40 MG/ML IJ SUSP
INTRAMUSCULAR | Status: AC
  Filled 2013-08-20: qty 5

## 2013-08-20 MED ORDER — BSS IO SOLN
INTRAOCULAR | Status: AC
  Filled 2013-08-20: qty 500

## 2013-08-20 MED ORDER — EPINEPHRINE HCL 1 MG/ML IJ SOLN
INTRAMUSCULAR | Status: AC
  Filled 2013-08-20: qty 1

## 2013-08-20 MED ORDER — ACETYLCHOLINE CHLORIDE 1:100 IO SOLR
INTRAOCULAR | Status: AC
  Filled 2013-08-20: qty 1

## 2013-08-20 MED ORDER — PROPOFOL INFUSION 10 MG/ML OPTIME
INTRAVENOUS | Status: DC | PRN
  Administered 2013-08-20: 25 ug/kg/min via INTRAVENOUS

## 2013-08-20 MED ORDER — MUPIROCIN 2 % EX OINT
TOPICAL_OINTMENT | CUTANEOUS | Status: AC
  Administered 2013-08-20: 1 via TOPICAL
  Filled 2013-08-20: qty 22

## 2013-08-20 MED ORDER — PROPOFOL 10 MG/ML IV BOLUS
INTRAVENOUS | Status: DC | PRN
  Administered 2013-08-20: 40 mg via INTRAVENOUS

## 2013-08-20 MED ORDER — TETRACAINE HCL 0.5 % OP SOLN
OPHTHALMIC | Status: DC | PRN
  Administered 2013-08-20: 1 [drp] via OPHTHALMIC

## 2013-08-20 MED ORDER — BSS IO SOLN
INTRAOCULAR | Status: AC
  Filled 2013-08-20: qty 15

## 2013-08-20 MED ORDER — SODIUM CHLORIDE 0.9 % IV SOLN
INTRAVENOUS | Status: DC | PRN
  Administered 2013-08-20: 15:00:00 via INTRAVENOUS

## 2013-08-20 SURGICAL SUPPLY — 50 items
ALLOGRAFT TUTOPLAST 1.5X1.5 (Ophthalmic Related) ×1 IMPLANT
APL SRG 3 HI ABS STRL LF PLS (MISCELLANEOUS) ×1
APPLICATOR COTTON TIP 6IN STRL (MISCELLANEOUS) IMPLANT
APPLICATOR DR MATTHEWS STRL (MISCELLANEOUS) ×3 IMPLANT
BLADE 10 SAFETY STRL DISP (BLADE) ×3 IMPLANT
BLADE EYE CATARACT 19 1.4 BEAV (BLADE) ×3 IMPLANT
BLADE STAB KNIFE 45DEG (BLADE) IMPLANT
BLADE SURG 15 STRL LF DISP TIS (BLADE) IMPLANT
BLADE SURG 15 STRL SS (BLADE)
CANISTER SUCTION 2500CC (MISCELLANEOUS) IMPLANT
CORDS BIPOLAR (ELECTRODE) ×3 IMPLANT
DRAPE OPHTHALMIC 40X48 W POUCH (DRAPES) ×3 IMPLANT
DRAPE RETRACTOR (MISCELLANEOUS) ×3 IMPLANT
ERASER HMR WETFIELD 23G BP (MISCELLANEOUS) IMPLANT
GLOVE BIO SURGEON STRL SZ8 (GLOVE) ×3 IMPLANT
GLOVE BIO SURGEON STRL SZ8.5 (GLOVE) ×3 IMPLANT
GLOVE ECLIPSE 7.0 STRL STRAW (GLOVE) ×3 IMPLANT
GOWN STRL REUS W/ TWL LRG LVL3 (GOWN DISPOSABLE) ×2 IMPLANT
GOWN STRL REUS W/ TWL XL LVL3 (GOWN DISPOSABLE) ×1 IMPLANT
GOWN STRL REUS W/TWL LRG LVL3 (GOWN DISPOSABLE) ×6
GOWN STRL REUS W/TWL XL LVL3 (GOWN DISPOSABLE) ×3
KIT BASIN OR (CUSTOM PROCEDURE TRAY) ×3 IMPLANT
KIT ROOM TURNOVER OR (KITS) ×3 IMPLANT
KNIFE GRIESHABER SHARP 2.5MM (MISCELLANEOUS) ×3 IMPLANT
MARKER SKIN DUAL TIP RULER LAB (MISCELLANEOUS) ×3 IMPLANT
NEEDLE 22X1 1/2 (OR ONLY) (NEEDLE) ×3 IMPLANT
NEEDLE 25GX 5/8IN NON SAFETY (NEEDLE) IMPLANT
NEEDLE HYPO 23GX1 LL BLUE HUB (NEEDLE) IMPLANT
NEEDLE HYPO 30X.5 LL (NEEDLE) ×3 IMPLANT
NS IRRIG 1000ML POUR BTL (IV SOLUTION) ×3 IMPLANT
PACK CATARACT CUSTOM (CUSTOM PROCEDURE TRAY) ×3 IMPLANT
PAD ARMBOARD 7.5X6 YLW CONV (MISCELLANEOUS) ×6 IMPLANT
SPEAR EYE SURG WECK-CEL (MISCELLANEOUS) IMPLANT
SPECIMEN JAR SMALL (MISCELLANEOUS) IMPLANT
SUT ETHILON 10 0 CS140 6 (SUTURE) IMPLANT
SUT ETHILON 9 0 BV100 4 (SUTURE) ×3 IMPLANT
SUT ETHILON 9 0 TG140 8 (SUTURE) IMPLANT
SUT MERSILENE 6 0 S14 DA (SUTURE) IMPLANT
SUT SILK 6 0 G 6 (SUTURE) ×3 IMPLANT
SUT VICRYL 8 0 TG140 8 (SUTURE) IMPLANT
SUT VICRYL 9-0 (SUTURE) ×3 IMPLANT
SYR 50ML SLIP (SYRINGE) ×3 IMPLANT
SYR TB 1ML LUER SLIP (SYRINGE) IMPLANT
TOWEL OR 17X24 6PK STRL BLUE (TOWEL DISPOSABLE) ×6 IMPLANT
TUBE CONNECTING 12'X1/4 (SUCTIONS)
TUBE CONNECTING 12X1/4 (SUCTIONS) IMPLANT
TUTOPLAST 1.5X1.5 (Ophthalmic Related) ×3 IMPLANT
VALVE GLAUCOMA AHMED (Prosthesis & Implant Heart) ×3 IMPLANT
WATER STERILE IRR 1000ML POUR (IV SOLUTION) ×3 IMPLANT
WIPE INSTRUMENT VISIWIPE 73X73 (MISCELLANEOUS) ×6 IMPLANT

## 2013-08-20 NOTE — Discharge Instructions (Signed)
The patient should not remove the eye patch leave the eye patch on until the patient is seen in the doctor's office tomorrow. The patient should continue the same eyedrops left eye but use no eyedrops right eye. The patient should sleep on her back or left side avoid heavy lifting bending or straining. If the patient experiences pain tonight she [name] usual pain medications if this has not affected the patient should call the doctor's office.

## 2013-08-20 NOTE — Anesthesia Preprocedure Evaluation (Addendum)
Anesthesia Evaluation  Patient identified by MRN, date of birth, ID band Patient awake    Reviewed: Allergy & Precautions, H&P , NPO status , Patient's Chart, lab work & pertinent test results  Airway Mallampati: II TM Distance: >3 FB Neck ROM: Full    Dental  (+) Missing, Chipped   Pulmonary neg pulmonary ROS,  breath sounds clear to auscultation        Cardiovascular hypertension, Pt. on medications + CAD and + Past MI + dysrhythmias Rhythm:Regular Rate:Normal     Neuro/Psych    GI/Hepatic negative GI ROS, Neg liver ROS,   Endo/Other  diabetes, Well Controlled, Type 2, Insulin Dependent  Renal/GU CRF, ESRF and DialysisRenal diseaseM-Wed-Fr     Musculoskeletal   Abdominal   Peds  Hematology   Anesthesia Other Findings   Reproductive/Obstetrics                        Anesthesia Physical Anesthesia Plan  ASA: III  Anesthesia Plan: MAC   Post-op Pain Management:    Induction: Intravenous  Airway Management Planned: Simple Face Mask  Additional Equipment:   Intra-op Plan:   Post-operative Plan:   Informed Consent: I have reviewed the patients History and Physical, chart, labs and discussed the procedure including the risks, benefits and alternatives for the proposed anesthesia with the patient or authorized representative who has indicated his/her understanding and acceptance.   Dental advisory given  Plan Discussed with: CRNA and Anesthesiologist  Anesthesia Plan Comments:         Anesthesia Quick Evaluation

## 2013-08-20 NOTE — Anesthesia Postprocedure Evaluation (Signed)
  Anesthesia Post-op Note  Patient: [name]  Procedure(s) Performed: Procedure(s): INSERTION OF AHMED VALVE WITH Mitomycin C application (Right)  Patient Location: PACU  Anesthesia Type:MAC  Level of Consciousness: awake and alert   Airway and Oxygen Therapy: Patient Spontanous Breathing  Post-op Pain: none  Post-op Assessment: Post-op Vital signs reviewed, Patient's Cardiovascular Status Stable, Respiratory Function Stable, Patent Airway and Pain level controlled  Post-op Vital Signs: stable  Last Vitals:  Filed Vitals:   08/20/13 1726  BP: 118/54  Pulse: 86  Temp: 36.8 C  Resp: 23    Complications: No apparent anesthesia complications

## 2013-08-20 NOTE — Interval H&P Note (Signed)
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 After consideration of risks, benefits and other options for treatment, the patient has consented to  Procedure(s): INSERTION OF AHMED VALVE WITH [place] (Right) as a surgical intervention .  The patient's history has been reviewed, patient examined, no change in status, stable for surgery.  I have reviewed the patient's chart and labs.  Questions were answered to the patient's satisfaction.     [name]

## 2013-08-20 NOTE — Op Note (Signed)
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 It was noted that the patient had a superior subconjunctival hemorrhage with the surgeon sitting at the 12:00 position a 6-0 nylon suture was passed through clear cornea to infraducted the eye an incision was made in the superior temporal conjunctiva at the limbus using a blunt W[place]ott scissors a fornix-based conjunctival flap was formed T9 fibers were recess with a [place]oke blade and bleeding was controlled with cautery following this mitomycin-C 0.4 mg per cc was placed onto Gelfoam sponges and allowed to stay under the conjunctiva for 4 minutes the sponges were then removed and irrigated with 40 cc of balanced salt solution following this the Ahmed plate model F. P7 SN [id number] was inspected the Ahmed was irrigated and primed with balanced salt solution until there was adequate egressed of fluid to the plate then the Hoskins forceps were used to place the Ahmed plate with the eyelids 8 mm from the corneal scleral limbus the eyelids were sutured with 60 [place] suture on a spatula needle following this the tube was trimmed bevel up a paracentesis tract was formed at the 7:30 position it inferior temporal and Provisc was injected in the anterior chamber the Provisc was then attached to a 22-gauge needle and passed at the limbus into the anterior chamber following this using the tube inserter the tube was placed into the anterior chamber the tube was sutured with a  9-0 nylon suture on a spatula needle following this the Tutoplast which had been soaked was fashioned to cover the tube and sutured with 2 interrupted 9-0 nylon sutures on a spatula needle following this the conjunctiva was then closed using a 9-0 Vicryl on a BV 100 needle BSS was injected in the anterior chamber the Provisc was noted to come out of the paracentesis tract the incision was Seidel negative therefore subconjunctival injection of Kenalog was given in the inferior temporal subconjunctival space giving 4 mg of Kenalog. Following this topical TobraDex ointment was applied to the eye a patch and Fox shield. were placed and the patient returned to recovery area in stable condition Complications: None Marylynn Pearson Junior M.D.

## 2013-08-20 NOTE — H&P (View-Only) (Signed)
History & Physical:   DATE:   08-19-13  NAME:  [name], [name]     [id number]       HISTORY OF PRESENT ILLNESS: Dialysis M/W/F  Dr Ricki Miller former pt    Chief Eye Complaints   Diabetic/Glaucoma: Patient here for an IOP check after adding Alphagan P TID.  Va not good . Patient has been complaint with gtts. IDDM x1980, BS 121mg % after midnight. HA1C 7.5 back in Feb/Mar.   HPI: EYES: Reports symptoms of vision disturbances.        LOCATION:   RIGHT EYE        QUALITY/COURSE:   Reports condition is worsening.        INTENSITY/SEVERITY:    Reports measurement ( or degree) as moderate.      DURATION:   Reports the general length of symptoms to be months.                    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 CE w/  intraocular lens implant  OU (Dr. Anderson Malta) 06/22/11 PANRETINAL PHOTOCOAGULATION  OS, 11/2009 PPV,MP,EL OU Pick List - Surgeries  MEDICATIONS: Alphagan P: 0.1% solution SIG-  1 drop in both eyes three times a day    Lumigan: Strength-  SIG-  1 gtt in each affected eye once a day (in the evening) for 30 days  OU  Cosopt  (Dorzolamide-Hydrochloride-Timolol Mal):    2.23%-0.68% solution    SIG-   1 milliliter(s)  drop     2 times a day              Humalog (Insulin Lispro):  100 units/mL (injection)  SIG-   as directed    as directed  REVIEW OF SYSTEMS:  ROS:   GEN- Constitutional: HENT: GEN - Endocrine: Reports symptoms of diabetes.    LUNGS/Respiratory:  HEART/Cardiovascular: Reports symptoms of hypertension.    ABD/Gastrointestinal:  Musculoskeletal (BJE)     arthralgias    osteoarthritis/osteoarthrosis   NEURO/Neurological: PSYCH/Psychiatric:    Is the pt oriented to time, place, person? Yes Mood normal  agitated  TOBACCO: Smoker Status:   Never smoker   ICD10: Z87.898 ICD9: V13.89 Onset: 08/07/2013 09:12  SOCIAL HISTORY: retired   FAMILY HISTORY: Family History - 1st Degree Relatives:  Daughter alive and well.    ALLERGIES: Fluorescein Containing Compounds Severity-  Onset-  Reaction-  Status- Active Type- Drug allergy Date Changed-  HIVES Starter - Allergies - Summary:  PHYSICAL EXAMINATION:  VS: BP: 115/57.  P: 79 /min.  RR: 20 /min.  W: 200lbs 0oz.   VS: BMI: 30.0.  BP: 151/76.  H: 68.50 in.  P: 87 /min.  W: 200lbs 0oz.    Va     OD: cc CF @ 3' OS: cc 20/40  EYEGLASSES: 2015 OD: -1.00 +1.75 x169 OS: -1.50 +1.00 x011 ADD:+2.50  MR  08/07/2013 09:13  OD: -1.00 +1.75 x169 NI OS:  -1.75 +1.25 x011 20/50 ADD  K's OD: 45.75 46.50 OS: 45.50 46.00  VF:   OD: decrease vision in all four quadrants OS: full in all four quadrants  Motility full  PUPILS: 33mm reactive OU  EYELIDS & OCULAR ADNEXA normal    SLE: Conjunctiva: OD: 1+ injection Sub Conj inferior and superior     OS Quiet  Cornea:  Arcus &   Decreased Tear Break-Up Time OU,     anterior chamber   GH:WEXH with +1 cell and flare  OS: Deep with trace flare 0 cell   Iris Brown OU  positive rubeosis OD     OS trace peripapillary rubeosis   Lens  posterior chamber  intraocular lens implant  OU   Ta   in mmHg    OD  35        OS 24 Time08/11/2013 11:34   Gonio OD angle open 360 degrees with Rubeosis throughout TM  OS angle open 360 degrees to  scleral spur, TM +2 pigment     Dilation:No dilation EIOP07/30/2015 11:21   Fundus:  optic nerve   OD: pale temporal 55% cupping  Poor view                                                 OS: temporal pallor intact   Macula       OD                                                     OS poor view  Vessels  Periphery:  PANRETINAL PHOTOCOAGULATION Scars OU   Humphery visual field  OD double bjerrum scatoma extends into fixation with small island of vision  OS double nasal step, double arcuate defects      Exam: GENERAL: Appearance: HEAD, EARS, NOSE AND THROAT: Ears-Nose (external) Inspection: Externally, nose and ears are normal in appearance and without scars, lesions, or nodules.      Hearing assessment shows no problems with normal conversation.      LUNGS and RESPIRATORY: Lung auscultation elicits no wheezing, rhonci, rales or rubs and with equal breath sounds.    Respiratory effort described as breathing is unlabored and chest movement is symmetrical.    HEART (Cardiovascular): Heart auscultation discovers regular rate and rhythm; no murmur, gallop or rub. Normal heart sounds.    ABDOMEN (Gastrointestinal): Mass/Tenderness Exam: Neither are present.     MUSCULOSKELETAL (BJE): Inspection-Palpation: No major bone, joint, tendon, or muscle changes.      NEUROLOGICAL: Alert and oriented. No major deficits of coordination or sensation.      PSYCHIATRIC: Insight and judgment appear  both to be intact and appropriate.    Mood and affect are described as normal mood and full affect.  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        Actions:  Dilated fundus eval done Performed Date/Time: 08/07/2013 11:28 [id number] Ordering Provider: Marylynn Pearson, Brooke Bonito Related Dxs-  Order Due:  Consultant:  Modifiers-    DILATED MACULAR/FUNDUS Suzie Portela [place] PHYS/QHP Performed Date/Time: 08/07/2013 11:28 CPT#5010F Ordering Provider: Marylynn Pearson, Brooke Bonito. Related Dxs-  Order Due:  Consultant:  Modifiers-       Handouts: glaucoma , what is glaucoma?, glaucoma treatment, Beta-Blockers, glaucoma , what is glaucoma?, glaucoma treatment.    ___________________________ Marylynn Pearson, Brooke Bonito. Colon cancer   ICD10:  C18.9  ICD9: 153.9  Onset: 08/07/2013 09:28  Resolved: 08/07/2013  09:28  1986   Starter - Inactive Problems:

## 2013-08-20 NOTE — Transfer of Care (Signed)
Immediate Anesthesia Transfer of Care Note  Patient: [name]  Procedure(s) Performed: Procedure(s): INSERTION OF AHMED VALVE WITH Mitomycin C application (Right)  Patient Location: PACU  Anesthesia Type:MAC  Level of Consciousness: awake, alert , oriented and patient cooperative  Airway & Oxygen Therapy: Patient Spontanous Breathing and Patient connected to nasal cannula oxygen  Post-op Assessment: Report given to PACU RN, Post -op Vital signs reviewed and stable and Patient moving all extremities  Post vital signs: Reviewed and stable  Complications: No apparent anesthesia complications

## 2013-08-21 ENCOUNTER — Encounter (HOSPITAL_COMMUNITY): Payer: Self-pay | Admitting: Ophthalmology

## 2013-08-22 DIAGNOSIS — D509 Iron deficiency anemia, unspecified: Secondary | ICD-10-CM | POA: Diagnosis not present

## 2013-08-22 DIAGNOSIS — Z992 Dependence on renal dialysis: Secondary | ICD-10-CM | POA: Diagnosis not present

## 2013-08-22 DIAGNOSIS — N186 End stage renal disease: Secondary | ICD-10-CM | POA: Diagnosis not present

## 2013-08-22 DIAGNOSIS — N2581 Secondary hyperparathyroidism of renal origin: Secondary | ICD-10-CM | POA: Diagnosis not present

## 2013-08-22 DIAGNOSIS — E119 Type 2 diabetes mellitus without complications: Secondary | ICD-10-CM | POA: Diagnosis not present

## 2013-08-22 DIAGNOSIS — D631 Anemia in chronic kidney disease: Secondary | ICD-10-CM | POA: Diagnosis not present

## 2013-08-25 DIAGNOSIS — E119 Type 2 diabetes mellitus without complications: Secondary | ICD-10-CM | POA: Diagnosis not present

## 2013-08-25 DIAGNOSIS — D509 Iron deficiency anemia, unspecified: Secondary | ICD-10-CM | POA: Diagnosis not present

## 2013-08-25 DIAGNOSIS — D631 Anemia in chronic kidney disease: Secondary | ICD-10-CM | POA: Diagnosis not present

## 2013-08-25 DIAGNOSIS — N2581 Secondary hyperparathyroidism of renal origin: Secondary | ICD-10-CM | POA: Diagnosis not present

## 2013-08-25 DIAGNOSIS — N039 Chronic nephritic syndrome with unspecified morphologic changes: Secondary | ICD-10-CM | POA: Diagnosis not present

## 2013-08-25 DIAGNOSIS — Z992 Dependence on renal dialysis: Secondary | ICD-10-CM | POA: Diagnosis not present

## 2013-08-25 DIAGNOSIS — N186 End stage renal disease: Secondary | ICD-10-CM | POA: Diagnosis not present

## 2013-08-27 DIAGNOSIS — E119 Type 2 diabetes mellitus without complications: Secondary | ICD-10-CM | POA: Diagnosis not present

## 2013-08-27 DIAGNOSIS — N186 End stage renal disease: Secondary | ICD-10-CM | POA: Diagnosis not present

## 2013-08-27 DIAGNOSIS — D631 Anemia in chronic kidney disease: Secondary | ICD-10-CM | POA: Diagnosis not present

## 2013-08-27 DIAGNOSIS — D509 Iron deficiency anemia, unspecified: Secondary | ICD-10-CM | POA: Diagnosis not present

## 2013-08-27 DIAGNOSIS — N2581 Secondary hyperparathyroidism of renal origin: Secondary | ICD-10-CM | POA: Diagnosis not present

## 2013-08-27 DIAGNOSIS — Z992 Dependence on renal dialysis: Secondary | ICD-10-CM | POA: Diagnosis not present

## 2013-08-29 DIAGNOSIS — D509 Iron deficiency anemia, unspecified: Secondary | ICD-10-CM | POA: Diagnosis not present

## 2013-08-29 DIAGNOSIS — D631 Anemia in chronic kidney disease: Secondary | ICD-10-CM | POA: Diagnosis not present

## 2013-08-29 DIAGNOSIS — N186 End stage renal disease: Secondary | ICD-10-CM | POA: Diagnosis not present

## 2013-08-29 DIAGNOSIS — E119 Type 2 diabetes mellitus without complications: Secondary | ICD-10-CM | POA: Diagnosis not present

## 2013-08-29 DIAGNOSIS — N2581 Secondary hyperparathyroidism of renal origin: Secondary | ICD-10-CM | POA: Diagnosis not present

## 2013-08-29 DIAGNOSIS — N039 Chronic nephritic syndrome with unspecified morphologic changes: Secondary | ICD-10-CM | POA: Diagnosis not present

## 2013-08-29 DIAGNOSIS — N390 Urinary tract infection, site not specified: Secondary | ICD-10-CM | POA: Diagnosis not present

## 2013-08-29 DIAGNOSIS — Z992 Dependence on renal dialysis: Secondary | ICD-10-CM | POA: Diagnosis not present

## 2013-09-01 DIAGNOSIS — N2581 Secondary hyperparathyroidism of renal origin: Secondary | ICD-10-CM | POA: Diagnosis not present

## 2013-09-01 DIAGNOSIS — N186 End stage renal disease: Secondary | ICD-10-CM | POA: Diagnosis not present

## 2013-09-01 DIAGNOSIS — E119 Type 2 diabetes mellitus without complications: Secondary | ICD-10-CM | POA: Diagnosis not present

## 2013-09-01 DIAGNOSIS — D631 Anemia in chronic kidney disease: Secondary | ICD-10-CM | POA: Diagnosis not present

## 2013-09-01 DIAGNOSIS — Z992 Dependence on renal dialysis: Secondary | ICD-10-CM | POA: Diagnosis not present

## 2013-09-01 DIAGNOSIS — D509 Iron deficiency anemia, unspecified: Secondary | ICD-10-CM | POA: Diagnosis not present

## 2013-09-03 DIAGNOSIS — D631 Anemia in chronic kidney disease: Secondary | ICD-10-CM | POA: Diagnosis not present

## 2013-09-03 DIAGNOSIS — N186 End stage renal disease: Secondary | ICD-10-CM | POA: Diagnosis not present

## 2013-09-03 DIAGNOSIS — D509 Iron deficiency anemia, unspecified: Secondary | ICD-10-CM | POA: Diagnosis not present

## 2013-09-03 DIAGNOSIS — N2581 Secondary hyperparathyroidism of renal origin: Secondary | ICD-10-CM | POA: Diagnosis not present

## 2013-09-03 DIAGNOSIS — Z992 Dependence on renal dialysis: Secondary | ICD-10-CM | POA: Diagnosis not present

## 2013-09-03 DIAGNOSIS — E119 Type 2 diabetes mellitus without complications: Secondary | ICD-10-CM | POA: Diagnosis not present

## 2013-09-04 ENCOUNTER — Ambulatory Visit: Payer: Medicare Other | Admitting: Internal Medicine

## 2013-09-04 DIAGNOSIS — Z0289 Encounter for other administrative examinations: Secondary | ICD-10-CM

## 2013-09-05 ENCOUNTER — Telehealth: Payer: Self-pay | Admitting: Internal Medicine

## 2013-09-05 DIAGNOSIS — N2581 Secondary hyperparathyroidism of renal origin: Secondary | ICD-10-CM | POA: Diagnosis not present

## 2013-09-05 DIAGNOSIS — D509 Iron deficiency anemia, unspecified: Secondary | ICD-10-CM | POA: Diagnosis not present

## 2013-09-05 DIAGNOSIS — Z992 Dependence on renal dialysis: Secondary | ICD-10-CM | POA: Diagnosis not present

## 2013-09-05 DIAGNOSIS — D631 Anemia in chronic kidney disease: Secondary | ICD-10-CM | POA: Diagnosis not present

## 2013-09-05 DIAGNOSIS — N186 End stage renal disease: Secondary | ICD-10-CM | POA: Diagnosis not present

## 2013-09-05 DIAGNOSIS — E119 Type 2 diabetes mellitus without complications: Secondary | ICD-10-CM | POA: Diagnosis not present

## 2013-09-05 NOTE — Telephone Encounter (Signed)
Patient no showed for diabetic bundle on 8/27.  Please advise.

## 2013-09-08 DIAGNOSIS — N2581 Secondary hyperparathyroidism of renal origin: Secondary | ICD-10-CM | POA: Diagnosis not present

## 2013-09-08 DIAGNOSIS — N186 End stage renal disease: Secondary | ICD-10-CM | POA: Diagnosis not present

## 2013-09-08 DIAGNOSIS — D509 Iron deficiency anemia, unspecified: Secondary | ICD-10-CM | POA: Diagnosis not present

## 2013-09-08 DIAGNOSIS — D631 Anemia in chronic kidney disease: Secondary | ICD-10-CM | POA: Diagnosis not present

## 2013-09-08 DIAGNOSIS — Z992 Dependence on renal dialysis: Secondary | ICD-10-CM | POA: Diagnosis not present

## 2013-09-08 DIAGNOSIS — E119 Type 2 diabetes mellitus without complications: Secondary | ICD-10-CM | POA: Diagnosis not present

## 2013-09-10 DIAGNOSIS — D631 Anemia in chronic kidney disease: Secondary | ICD-10-CM | POA: Diagnosis not present

## 2013-09-10 DIAGNOSIS — N186 End stage renal disease: Secondary | ICD-10-CM | POA: Diagnosis not present

## 2013-09-10 DIAGNOSIS — Z992 Dependence on renal dialysis: Secondary | ICD-10-CM | POA: Diagnosis not present

## 2013-09-10 DIAGNOSIS — N2581 Secondary hyperparathyroidism of renal origin: Secondary | ICD-10-CM | POA: Diagnosis not present

## 2013-09-10 DIAGNOSIS — E119 Type 2 diabetes mellitus without complications: Secondary | ICD-10-CM | POA: Diagnosis not present

## 2013-09-10 DIAGNOSIS — D509 Iron deficiency anemia, unspecified: Secondary | ICD-10-CM | POA: Diagnosis not present

## 2013-09-10 DIAGNOSIS — Z4931 Encounter for adequacy testing for hemodialysis: Secondary | ICD-10-CM | POA: Diagnosis not present

## 2013-09-16 ENCOUNTER — Telehealth: Payer: Self-pay | Admitting: *Deleted

## 2013-09-16 NOTE — Telephone Encounter (Signed)
Left message asking patient to call me back about amiodarone surveillance laboratories that she was going to have drawn at dialysis - we have not received these results yet.

## 2013-09-19 NOTE — Telephone Encounter (Signed)
Called: [facility]  Address: [address] [place], [place] [address]  [phone]  Spoke with Elmo Putt, RN who reports results as the following: TSH - 0.966 AST - 10 ALT - 6

## 2013-09-19 NOTE — Telephone Encounter (Signed)
Called patient again about lab results, I explained that we have not received results yet. She tells me that she had them drawn when she went to dialysis the same week she saw Dr. Caryl Comes (Dr. Caryl Comes OV was on 7/28). She gave me dialysis center name for me to call to check on results.

## 2013-09-22 ENCOUNTER — Emergency Department (HOSPITAL_COMMUNITY): Payer: Medicare Other

## 2013-09-22 ENCOUNTER — Emergency Department (HOSPITAL_COMMUNITY)
Admission: EM | Admit: 2013-09-22 | Discharge: 2013-09-22 | Disposition: A | Discharge status: Home / Self Care | Payer: Medicare Other | Attending: Emergency Medicine | Admitting: Emergency Medicine

## 2013-09-22 ENCOUNTER — Encounter (HOSPITAL_COMMUNITY): Payer: Self-pay | Admitting: Emergency Medicine

## 2013-09-22 DIAGNOSIS — R079 Chest pain, unspecified: Secondary | ICD-10-CM | POA: Insufficient documentation

## 2013-09-22 DIAGNOSIS — N186 End stage renal disease: Secondary | ICD-10-CM | POA: Diagnosis not present

## 2013-09-22 DIAGNOSIS — I5032 Chronic diastolic (congestive) heart failure: Secondary | ICD-10-CM | POA: Insufficient documentation

## 2013-09-22 DIAGNOSIS — R0789 Other chest pain: Secondary | ICD-10-CM | POA: Insufficient documentation

## 2013-09-22 DIAGNOSIS — M129 Arthropathy, unspecified: Secondary | ICD-10-CM | POA: Insufficient documentation

## 2013-09-22 DIAGNOSIS — I498 Other specified cardiac arrhythmias: Secondary | ICD-10-CM | POA: Diagnosis not present

## 2013-09-22 DIAGNOSIS — Z79899 Other long term (current) drug therapy: Secondary | ICD-10-CM | POA: Insufficient documentation

## 2013-09-22 DIAGNOSIS — Z794 Long term (current) use of insulin: Secondary | ICD-10-CM | POA: Diagnosis not present

## 2013-09-22 DIAGNOSIS — E785 Hyperlipidemia, unspecified: Secondary | ICD-10-CM | POA: Diagnosis not present

## 2013-09-22 DIAGNOSIS — Z8719 Personal history of other diseases of the digestive system: Secondary | ICD-10-CM | POA: Diagnosis not present

## 2013-09-22 DIAGNOSIS — Z992 Dependence on renal dialysis: Secondary | ICD-10-CM | POA: Insufficient documentation

## 2013-09-22 DIAGNOSIS — Z9889 Other specified postprocedural states: Secondary | ICD-10-CM | POA: Diagnosis not present

## 2013-09-22 DIAGNOSIS — I517 Cardiomegaly: Secondary | ICD-10-CM | POA: Diagnosis not present

## 2013-09-22 DIAGNOSIS — Z862 Personal history of diseases of the blood and blood-forming organs and certain disorders involving the immune mechanism: Secondary | ICD-10-CM | POA: Insufficient documentation

## 2013-09-22 DIAGNOSIS — Z85038 Personal history of other malignant neoplasm of large intestine: Secondary | ICD-10-CM | POA: Insufficient documentation

## 2013-09-22 DIAGNOSIS — H409 Unspecified glaucoma: Secondary | ICD-10-CM | POA: Diagnosis not present

## 2013-09-22 DIAGNOSIS — E119 Type 2 diabetes mellitus without complications: Secondary | ICD-10-CM | POA: Insufficient documentation

## 2013-09-22 DIAGNOSIS — Z8673 Personal history of transient ischemic attack (TIA), and cerebral infarction without residual deficits: Secondary | ICD-10-CM | POA: Insufficient documentation

## 2013-09-22 DIAGNOSIS — IMO0001 Reserved for inherently not codable concepts without codable children: Secondary | ICD-10-CM | POA: Diagnosis not present

## 2013-09-22 DIAGNOSIS — R0989 Other specified symptoms and signs involving the circulatory and respiratory systems: Secondary | ICD-10-CM | POA: Diagnosis not present

## 2013-09-22 DIAGNOSIS — I10 Essential (primary) hypertension: Secondary | ICD-10-CM | POA: Diagnosis not present

## 2013-09-22 DIAGNOSIS — E669 Obesity, unspecified: Secondary | ICD-10-CM | POA: Diagnosis not present

## 2013-09-22 DIAGNOSIS — I2510 Atherosclerotic heart disease of native coronary artery without angina pectoris: Secondary | ICD-10-CM | POA: Insufficient documentation

## 2013-09-22 LAB — COMPREHENSIVE METABOLIC PANEL
ALBUMIN: 3.2 g/dL — AB (ref 3.5–5.2)
ALT: 8 U/L (ref 0–35)
AST: 14 U/L (ref 0–37)
Alkaline Phosphatase: 99 U/L (ref 39–117)
Anion gap: 20 — ABNORMAL HIGH (ref 5–15)
BUN: 56 mg/dL — ABNORMAL HIGH (ref 6–23)
CHLORIDE: 94 meq/L — AB (ref 96–112)
CO2: 23 mEq/L (ref 19–32)
CREATININE: 8.16 mg/dL — AB (ref 0.50–1.10)
Calcium: 9.3 mg/dL (ref 8.4–10.5)
GFR calc Af Amer: 5 mL/min — ABNORMAL LOW (ref >90)
GFR calc non Af Amer: 5 mL/min — ABNORMAL LOW (ref >90)
Glucose, Bld: 88 mg/dL (ref 70–99)
POTASSIUM: 4 meq/L (ref 3.7–5.3)
Sodium: 137 mEq/L (ref 137–147)
Total Protein: 7.9 g/dL (ref 6.0–8.3)

## 2013-09-22 LAB — I-STAT TROPONIN, ED
TROPONIN I, POC: 0.04 ng/mL (ref 0.00–0.08)
TROPONIN I, POC: 0.06 ng/mL (ref 0.00–0.08)

## 2013-09-22 LAB — CBC
HCT: 38.8 % (ref 36.0–46.0)
Hemoglobin: 12.6 g/dL (ref 12.0–15.0)
MCH: 30.2 pg (ref 26.0–34.0)
MCHC: 32.5 g/dL (ref 30.0–36.0)
MCV: 93 fL (ref 78.0–100.0)
Platelets: 222 10*3/uL (ref 150–400)
RBC: 4.17 MIL/uL (ref 3.87–5.11)
RDW: 14.3 % (ref 11.5–15.5)
WBC: 7.6 10*3/uL (ref 4.0–10.5)

## 2013-09-22 LAB — TROPONIN I

## 2013-09-22 NOTE — ED Provider Notes (Signed)
CSN: [id number]     Arrival date & time 09/22/13  1217 History   First MD Initiated Contact with Patient 09/22/13 1622     Chief Complaint  Patient presents with  . Chest Pain     (Consider location/radiation/quality/duration/timing/severity/associated sxs/prior Treatment) HPI [name] is a 68 y.o. female  With extensive pmh who comes in for evaluation of chest discomfort. She reports riding in the car this am around 11:30 on the way to dialysis when she began to feel the discomfort. She could not characterize how it felt, but she denies any pain, pressure or squeezing sensations. The discomfort lasted, she said, between 5-10 min. She said the pain did not radiate anywhere and she denies N/V/SOB/ Diaphoresis. No leg swelling or difficulties breathing. She did take (2) 325mg  ASA, but says nothing helps or aggravates the pain. She does f/u with Dr. Caryl Comes , her cardiologist, every 6 mos. She denies previous hx of MI and that her last 2 heart caths were normal. 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  Past Medical History  Diagnosis Date  . Hyperlipidemia   . Mitral valve insufficiency and aortic valve insufficiency   . Cardiomyopathy- mixed   . 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 Chronic diastolic heart failure   . Anemia   . Coronary artery disease     Previously decreased EF; echo 113 normal LV function  . Hernia, incisional     abd  . History of colon cancer     history of colon cancer.  1986  . Arthritis   . LBBB (left bundle branch block)   . Atrial tachycardia     on amiod  . Hypertensive heart disease     sees Dr. Alain Marion  . Stroke     2011/12  . Renal failure     ESRD, Dr Dunham/Dr. Lyda Kalata.  M, W, Fr  . Obesity   . Hypertension   . Glaucoma    Past Surgical History  Procedure Laterality Date  . Colostomy  1986  . Revision urostomy  cutaneous    . Esophagogastroduodenoscopy  03-18-04  . Electrocardiogram  04-27-06  . Placement of new left forearm arteriovenous graft  03-11-08  . Left heart catheterization and right heart catheterization  12-10    R. heart cath showed elevated left and right heart filling pressures w/ pulmonary artery pressure elevated mildly out of proportion to the wedge. The left heart cath showed diffuse distal vessel disease as well as a 75% stenosis in the mid circumflex w/ a 90% stenosis of the ostial first obtuse marginal. These lesions were in close proximity. there was a 60-70%mild RCA stenosis.   . Arteriovenous graft placement  2010  . 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 Cardiac catheterization    . Eye surgery      Cataract Left  . Pars plana vitrectomy  11/29/2011    Procedure: PARS PLANA VITRECTOMY WITH 23 GAUGE;  Surgeon: Adonis Brook, MD;  Location: [place];  Service: Ophthalmology;  Laterality: Right;  Right Eye 23 ga vitrectomy with membrane peel  . Pars plana vitrectomy Left 02/28/2012    Procedure: PARS PLANA VITRECTOMY WITH 23 GAUGE;  Surgeon: Adonis Brook, MD;  Location: [place];  Service: Ophthalmology;  Laterality: Left;  . Esophagogastroduodenoscopy N/A 02/02/2013  Procedure: ESOPHAGOGASTRODUODENOSCOPY (EGD);  Surgeon: Irene Shipper, MD;  Location: [facility] ENDOSCOPY;  Service: Endoscopy;  Laterality: N/A;  . Vaginal hysterectomy    . Colonoscopy    . Insertion of ahmed valve Right 08/20/2013    Procedure: INSERTION OF AHMED VALVE WITH Mitomycin C application;  Surgeon: Marylynn Pearson, MD;  Location: [place];  Service: Ophthalmology;  Laterality: Right;   Family History  Problem Relation Age of Onset  . Cancer Sister     colon  . Hypertension Other   . Hypertension Mother   . Coronary artery disease Mother   . Hypertension Father   . Diabetes Father    History  Substance Use Topics  . Smoking status: Never Smoker   . Smokeless tobacco: Never Used   . Alcohol Use: No   OB History   Grav Para Term Preterm Abortions TAB SAB Ect Mult Living                 Review of Systems  Constitutional: Negative for fever.  HENT: Negative for sore throat.   Eyes: Negative for visual disturbance.  Respiratory: Negative for chest tightness and shortness of breath.   Cardiovascular: Negative for chest pain and leg swelling.  Gastrointestinal: Negative for abdominal pain.  Endocrine: Negative for polyuria.  Genitourinary: Negative for dysuria.  Musculoskeletal: Negative for myalgias.  Skin: Negative for rash.  Neurological: Negative for headaches.      Allergies  Ace inhibitors; Eggs or egg-derived products; Enalapril; Lisinopril; and Omnipaque  Home Medications   Prior to Admission medications   Medication Sig Start Date End Date Taking? Authorizing Provider  ALPHAGAN P 0.1 % SOLN Place 1 drop into both eyes 3 (three) times daily.  08/14/13  Yes Historical Provider, MD  amiodarone (PACERONE) 200 MG tablet Take 100 mg by mouth daily. Takes in afternoon   Yes Historical Provider, MD  aspirin 325 MG tablet Take 325 mg by mouth every 4 (four) hours as needed for mild pain.   Yes Historical Provider, MD  calcium acetate (PHOSLO) 667 MG capsule Take 1,334-2,001 mg by mouth See admin instructions. Take 2001mg  three times daily with meals and Take 1334mg   with snacks.   Yes Historical Provider, MD  dorzolamide-timolol (COSOPT) 22.3-6.8 MG/ML ophthalmic solution Place 1 drop into both eyes 2 (two) times daily.  01/26/13  Yes Historical Provider, MD  Fluticasone-Salmeterol (ADVAIR) 250-50 MCG/DOSE AEPB Inhale 1 puff into the lungs daily as needed (shortness of breath).  05/28/12  Yes Aleksei Plotnikov V, MD  insulin lispro protamine-lispro (HUMALOG 50/50 MIX) (50-50) 100 UNIT/ML SUSP injection Inject 10 Units into the skin daily with breakfast. 02/04/13  Yes Belkys A Regalado, MD  isosorbide mononitrate (IMDUR) 60 MG 24 hr tablet Take 60 mg by mouth daily.  Takes in afternoon 01/30/13  Yes Deboraha Sprang, MD  metoprolol (LOPRESSOR) 50 MG tablet Take 50 mg by mouth 4 (four) times a week. Take 50mg  once a day on Tuesday, Thursday, Saturday, and Sunday. Skipping Dialysis days.   Yes Historical Provider, MD  simvastatin (ZOCOR) 10 MG tablet Take 10 mg by mouth every evening. 01/30/13  Yes Deboraha Sprang, MD   BP 125/52  Pulse 88  Temp(Src) 97.8 F (36.6 C) (Oral)  Resp 12  SpO2 96% Physical Exam  Nursing note and vitals reviewed. Constitutional: She is oriented to person, place, and time. She appears well-developed and well-nourished. No distress.  HENT:  Head: Normocephalic and atraumatic.  Mouth/Throat: Oropharynx is clear and moist.  Eyes: Conjunctivae are normal. Pupils are equal, round, and reactive to light. Right eye exhibits no discharge. Left eye exhibits no discharge. No scleral icterus.  Neck: Neck supple. No JVD present.  Cardiovascular: Normal rate, regular rhythm and normal heart sounds.   Pulmonary/Chest: Effort normal and breath sounds normal. No respiratory distress. She has no wheezes. She has no rales.  Abdominal: Soft. There is no tenderness.  Musculoskeletal: She exhibits no edema and no tenderness.  Neurological: She is alert and oriented to person, place, and time.  Cranial Nerves II-XII grossly intact  Skin: Skin is warm and dry. No rash noted. She is not diaphoretic.  Psychiatric: She has a normal mood and affect.    ED Course  Procedures (including critical care time) Labs Review Labs Reviewed  COMPREHENSIVE METABOLIC PANEL - Abnormal; Notable for the following:    Chloride 94 (*)    BUN 56 (*)    Creatinine, Ser 8.16 (*)    Albumin 3.2 (*)    Total Bilirubin <0.2 (*)    GFR calc non Af Amer 5 (*)    GFR calc Af Amer 5 (*)    Anion gap 20 (*)    All other components within normal limits  CBC  TROPONIN I  I-STAT TROPOININ, ED  I-STAT TROPOININ, ED  Randolm Idol, ED    Imaging Review Dg Chest 2  View  09/22/2013   CLINICAL DATA:  Left chest pain  EXAM: CHEST  2 VIEW  COMPARISON:  08/20/2013  FINDINGS: Cardiomegaly with pulmonary vascular congestion. Possible mild right perihilar edema. No pleural effusion or pneumothorax.  Degenerative changes of the visualized thoracolumbar spine.  Surgical clips in the mid abdomen.  IMPRESSION: Cardiomegaly with pulmonary vascular congestion and possible mild right perihilar edema.   Electronically Signed   By: Julian Hy M.D.   On: 09/22/2013 18:29     EKG Interpretation   Date/Time:  Monday September 22 2013 12:22:51 EDT Ventricular Rate:  92 PR Interval:  214 QRS Duration: 160 QT Interval:  426 QTC Calculation: 526 R Axis:   -27 Text Interpretation:  Sinus rhythm with 1st degree A-V block Possible Left  atrial enlargement Left bundle branch block Abnormal ECG No significant  change since last tracing Confirmed by WARD,  DO, KRISTEN [phone]) on  09/22/2013 12:33:20 PM     Meds given in ED:  Medications - No data to display  Discharge Medication List as of 09/22/2013  9:05 PM     Filed Vitals:   09/22/13 1757 09/22/13 1842 09/22/13 2011 09/22/13 2051  BP: 152/79 125/40 138/70 125/52  Pulse: 85 84 85 88  Temp:      TempSrc:      Resp: 20 14 17 12   SpO2: 99% 97% 95% 96%   .Scharlene Corn Filed Vitals:   09/22/13 1757 09/22/13 1842 09/22/13 2011 09/22/13 2051  BP: 152/79 125/40 138/70 125/52  Pulse: 85 84 85 88  Temp:      TempSrc:      Resp: 20 14 17 12   SpO2: 99% 97% 95% 96%    MDM  Vitals stable - WNL -afebrile Pt resting comfortably in ED. Although she has extensive past medical history, patient is adamant that she does not want to be admitted. Negotiated the patient stay for delta troponin.  PE and clinical picture not concerning for acute emergency at this time.  Labwork noncontributory (pt did not make it to dialysis), EKG shows no significant change, Delta troponin negative. CXR shows no  acute cardiopulmonary  pathology Discussed f/u with PCP and return precautions, pt very amenable to plan.   Final diagnoses:  Chest discomfort  Prior to patient discharge, I discussed and reviewed this case with Dr.Harrison         Verl Dicker, PA-C 09/23/13 1045

## 2013-09-22 NOTE — ED Notes (Addendum)
Pt was stable, and ready to go home. MD gave patient d/c paperwork, and patient believed that she could leave. Pt did not see this RN or another Therapist, sports before leaving. Unable to obtain signiature for this reason.

## 2013-09-22 NOTE — Discharge Instructions (Signed)
Chest Pain (Nonspecific) °It is often hard to give a specific diagnosis for the cause of chest pain. There is always a chance that your pain could be related to something serious, such as a heart attack or a blood clot in the lungs. You need to follow up with your health care provider for further evaluation. °CAUSES  °· Heartburn. °· Pneumonia or bronchitis. °· Anxiety or stress. °· Inflammation around your heart (pericarditis) or lung (pleuritis or pleurisy). °· A blood clot in the lung. °· A collapsed lung (pneumothorax). It can develop suddenly on its own (spontaneous pneumothorax) or from trauma to the chest. °· Shingles infection (herpes zoster virus). °The chest wall is composed of bones, muscles, and cartilage. Any of these can be the source of the pain. °· The bones can be bruised by injury. °· The muscles or cartilage can be strained by coughing or overwork. °· The cartilage can be affected by inflammation and become sore (costochondritis). °DIAGNOSIS  °Lab tests or other studies may be needed to find the cause of your pain. Your health care provider may have you take a test called an ambulatory electrocardiogram (ECG). An ECG records your heartbeat patterns over a 24-hour period. You may also have other tests, such as: °· Transthoracic echocardiogram (TTE). During echocardiography, sound waves are used to evaluate how blood flows through your heart. °· Transesophageal echocardiogram (TEE). °· Cardiac monitoring. This allows your health care provider to monitor your heart rate and rhythm in real time. °· Holter monitor. This is a portable device that records your heartbeat and can help diagnose heart arrhythmias. It allows your health care provider to track your heart activity for several days, if needed. °· Stress tests by exercise or by giving medicine that makes the heart beat faster. °TREATMENT  °· Treatment depends on what may be causing your chest pain. Treatment may include: °¨ Acid blockers for  heartburn. °¨ Anti-inflammatory medicine. °¨ Pain medicine for inflammatory conditions. °¨ Antibiotics if an infection is present. °· You may be advised to change lifestyle habits. This includes stopping smoking and avoiding alcohol, caffeine, and chocolate. °· You may be advised to keep your head raised (elevated) when sleeping. This reduces the chance of acid going backward from your stomach into your esophagus. °Most of the time, nonspecific chest pain will improve within 2-3 days with rest and mild pain medicine.  °HOME CARE INSTRUCTIONS  °· If antibiotics were prescribed, take them as directed. Finish them even if you start to feel better. °· For the next few days, avoid physical activities that bring on chest pain. Continue physical activities as directed. °· Do not use any tobacco products, including cigarettes, chewing tobacco, or electronic cigarettes. °· Avoid drinking alcohol. °· Only take medicine as directed by your health care provider. °· Follow your health care provider's suggestions for further testing if your chest pain does not go away. °· Keep any follow-up appointments you made. If you do not go to an appointment, you could develop lasting (chronic) problems with pain. If there is any problem keeping an appointment, call to reschedule. °SEEK MEDICAL CARE IF:  °· Your chest pain does not go away, even after treatment. °· You have a rash with blisters on your chest. °· You have a fever. °SEEK IMMEDIATE MEDICAL CARE IF:  °· You have increased chest pain or pain that spreads to your arm, neck, jaw, back, or abdomen. °· You have shortness of breath. °· You have an increasing cough, or you cough   up blood.  You have severe back or abdominal pain.  You feel nauseous or vomit.  You have severe weakness.  You faint.  You have chills. This is an emergency. Do not wait to see if the pain will go away. Get medical help at once. Call your local emergency services (911 in [place] Do not drive  yourself to the hospital. MAKE SURE YOU:   Understand these instructions.  Will watch your condition.  Will get help right away if you are not doing well or get worse. Document Released: 10/05/2004 Document Revised: 12/31/2012 Document Reviewed: 08/01/2007 [facility] Patient Information 2015 [place], [place]. This information is not intended to replace advice given to you by your health care provider. Make sure you discuss any questions you have with your health care provider.   Please return to ED for further evaluation if your symptoms return, you experience chest pain, nausea vomiting, difficulties breathing, numbness or weakness

## 2013-09-22 NOTE — ED Notes (Signed)
Attempted to draw blood x2 

## 2013-09-22 NOTE — ED Notes (Addendum)
Pt is aware that urine is needed. States "I don't make much urine because of dialysis." EDP aware.

## 2013-09-22 NOTE — ED Notes (Signed)
Cp that started on the way to dialysis this am took 2 bayer asa and pain went away states that her brother passed away yesterday and she thinks she got shook up pt had no n/v/sob

## 2013-09-24 NOTE — ED Provider Notes (Signed)
Medical screening examination/treatment/procedure(s) were conducted as a shared visit with non-physician practitioner(s) and myself.  I personally evaluated the patient during the encounter.   EKG Interpretation   Date/Time:  Monday September 22 2013 12:22:51 EDT Ventricular Rate:  92 PR Interval:  214 QRS Duration: 160 QT Interval:  426 QTC Calculation: 526 R Axis:   -27 Text Interpretation:  Sinus rhythm with 1st degree A-V block Possible Left  atrial enlargement Left bundle branch block Abnormal ECG No significant  change since last tracing Confirmed by WARD,  DO, KRISTEN ([id number]) on  09/22/2013 12:33:20 PM      I interviewed and examined the patient. Lungs are CTAB. Cardiac exam wnl w/ mild systolic murmur. Abdomen soft.  Pt w/ heart hx. 5-10 min of cp while riding to HD. Asx on exam. She refuses to stay in the hospital. I recommended she stay at least until we can get a delta troponin. She agreed. She remained asx in the ED during her stay. She believes her sx are related to the loss of a family member over the weekend. Takotsubo's cardiomyopathy is a consideration, but given the pt's well appearance, the fact that she remains asx, and her unwillingness to stay, I think a delta trop and non-contrib workup will suffice until she can f/u closely w/ her cardiologist.   Pamella Pert, MD 09/24/13 1220

## 2013-10-02 DIAGNOSIS — H409 Unspecified glaucoma: Secondary | ICD-10-CM | POA: Diagnosis not present

## 2013-10-02 DIAGNOSIS — Z9849 Cataract extraction status, unspecified eye: Secondary | ICD-10-CM | POA: Diagnosis not present

## 2013-10-02 DIAGNOSIS — H4010X0 Unspecified open-angle glaucoma, stage unspecified: Secondary | ICD-10-CM | POA: Diagnosis not present

## 2013-10-02 DIAGNOSIS — E11311 Type 2 diabetes mellitus with unspecified diabetic retinopathy with macular edema: Secondary | ICD-10-CM | POA: Diagnosis not present

## 2013-10-02 DIAGNOSIS — E11359 Type 2 diabetes mellitus with proliferative diabetic retinopathy without macular edema: Secondary | ICD-10-CM | POA: Diagnosis not present

## 2013-10-08 DIAGNOSIS — N186 End stage renal disease: Secondary | ICD-10-CM | POA: Diagnosis not present

## 2013-10-10 DIAGNOSIS — Z23 Encounter for immunization: Secondary | ICD-10-CM | POA: Diagnosis not present

## 2013-10-10 DIAGNOSIS — D509 Iron deficiency anemia, unspecified: Secondary | ICD-10-CM | POA: Diagnosis not present

## 2013-10-10 DIAGNOSIS — D631 Anemia in chronic kidney disease: Secondary | ICD-10-CM | POA: Diagnosis not present

## 2013-10-10 DIAGNOSIS — R8290 Unspecified abnormal findings in urine: Secondary | ICD-10-CM | POA: Diagnosis not present

## 2013-10-10 DIAGNOSIS — N2581 Secondary hyperparathyroidism of renal origin: Secondary | ICD-10-CM | POA: Diagnosis not present

## 2013-10-10 DIAGNOSIS — E119 Type 2 diabetes mellitus without complications: Secondary | ICD-10-CM | POA: Diagnosis not present

## 2013-10-10 DIAGNOSIS — N186 End stage renal disease: Secondary | ICD-10-CM | POA: Diagnosis not present

## 2013-10-10 DIAGNOSIS — E1129 Type 2 diabetes mellitus with other diabetic kidney complication: Secondary | ICD-10-CM | POA: Diagnosis not present

## 2013-10-10 DIAGNOSIS — Z4931 Encounter for adequacy testing for hemodialysis: Secondary | ICD-10-CM | POA: Diagnosis not present

## 2013-10-16 ENCOUNTER — Encounter: Payer: Self-pay | Admitting: Endocrinology

## 2013-10-16 ENCOUNTER — Ambulatory Visit (INDEPENDENT_AMBULATORY_CARE_PROVIDER_SITE_OTHER): Payer: Medicare Other | Admitting: Endocrinology

## 2013-10-16 ENCOUNTER — Telehealth: Payer: Self-pay | Admitting: *Deleted

## 2013-10-16 VITALS — BP 132/78 | HR 78 | Temp 97.5°F | Ht 60.0 in | Wt 202.0 lb

## 2013-10-16 DIAGNOSIS — I2510 Atherosclerotic heart disease of native coronary artery without angina pectoris: Secondary | ICD-10-CM | POA: Diagnosis not present

## 2013-10-16 DIAGNOSIS — E1022 Type 1 diabetes mellitus with diabetic chronic kidney disease: Secondary | ICD-10-CM | POA: Diagnosis not present

## 2013-10-16 DIAGNOSIS — N189 Chronic kidney disease, unspecified: Secondary | ICD-10-CM | POA: Diagnosis not present

## 2013-10-16 LAB — BASIC METABOLIC PANEL
BUN: 28 mg/dL — ABNORMAL HIGH (ref 6–23)
CALCIUM: 8.5 mg/dL (ref 8.4–10.5)
CHLORIDE: 95 meq/L — AB (ref 96–112)
CO2: 29 mEq/L (ref 19–32)
CREATININE: 5.2 mg/dL — AB (ref 0.4–1.2)
GFR: 10.61 mL/min — AB (ref >60.00)
Glucose, Bld: 178 mg/dL — ABNORMAL HIGH (ref 70–99)
Potassium: 3.8 mEq/L (ref 3.5–5.1)
Sodium: 141 mEq/L (ref 135–145)

## 2013-10-16 LAB — LIPID PANEL
CHOLESTEROL: 174 mg/dL (ref 0–200)
HDL: 37.3 mg/dL — ABNORMAL LOW (ref >39.00)
LDL CALC: 99 mg/dL (ref 0–99)
NonHDL: 136.7
Total CHOL/HDL Ratio: 5
Triglycerides: 188 mg/dL — ABNORMAL HIGH (ref 0.0–149.0)
VLDL: 37.6 mg/dL (ref 0.0–40.0)

## 2013-10-16 NOTE — Telephone Encounter (Signed)
She is on dialysis, no need for concern

## 2013-10-16 NOTE — Progress Notes (Signed)
Subjective:    Patient ID: [name], female    DOB: 02-22-1945, 68 y.o.   MRN: [id number]  HPI Pt returns for f/u of diabetes mellitus: DM type: Insulin-requiring type 2 Dx'ed: 5638 Complications: polyneuropathy, renal failure (dialysis), PAD, CVA, CHF Therapy: insulin GDM: never DKA: never Severe hypoglycemia: last episode was 2013 Pancreatitis: never Other: she takes qd insulin, at her request.  Interval history: no cbg record, but states cbg's vary from 82-200.  It is in general higher as the day goes on.  She skips her insulin about every other day, because she worries about hypoglycemia.    Past Medical History  Diagnosis Date  . Hyperlipidemia   . Mitral valve insufficiency and aortic valve insufficiency   . Cardiomyopathy- mixed   . 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 Chronic diastolic heart failure   . Anemia   . Coronary artery disease     Previously decreased EF; echo 113 normal LV function  . Hernia, incisional     abd  . History of colon cancer     history of colon cancer.  1986  . Arthritis   . LBBB (left bundle branch block)   . Atrial tachycardia     on amiod  . Hypertensive heart disease     sees Dr. Alain Marion  . Stroke     2011/12  . Renal failure     ESRD, Dr Dunham/Dr. Lyda Kalata.  M, W, Fr  . Obesity   . Hypertension   . Glaucoma     Past Surgical History  Procedure Laterality Date  . Colostomy  1986  . Revision urostomy cutaneous    . Esophagogastroduodenoscopy  03-18-04  . Electrocardiogram  04-27-06  . Placement of new left forearm arteriovenous graft  03-11-08  . Left heart catheterization and right heart catheterization  12-10    R. heart cath showed elevated left and right heart filling pressures w/ pulmonary artery pressure elevated mildly out of proportion to the wedge. The left heart cath showed diffuse distal vessel disease as well as a 75% stenosis in the mid circumflex w/ a  90% stenosis of the ostial first obtuse marginal. These lesions were in close proximity. there was a 60-70%mild RCA stenosis.   . Arteriovenous graft placement  2010  . 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 Cardiac catheterization    . Eye surgery      Cataract Left  . Pars plana vitrectomy  11/29/2011    Procedure: PARS PLANA VITRECTOMY WITH 23 GAUGE;  Surgeon: Adonis Brook, MD;  Location: [place];  Service: Ophthalmology;  Laterality: Right;  Right Eye 23 ga vitrectomy with membrane peel  . Pars plana vitrectomy Left 02/28/2012    Procedure: PARS PLANA VITRECTOMY WITH 23 GAUGE;  Surgeon: Adonis Brook, MD;  Location: [place];  Service: Ophthalmology;  Laterality: Left;  . Esophagogastroduodenoscopy N/A 02/02/2013    Procedure: ESOPHAGOGASTRODUODENOSCOPY (EGD);  Surgeon: Irene Shipper, MD;  Location[facility] ENDOSCOPY;  Service: Endoscopy;  Laterality: N/A;  . Vaginal hysterectomy    . Colonoscopy    . Insertion of ahmed valve Right 08/20/2013    Procedure: INSERTION OF AHMED VALVE WITH Mitomycin C application;  Surgeon: Marylynn Pearson, MD;  Location: [place];  Service: Ophthalmology;  Laterality: Right;    History   Social History  . Marital Status: Single    Spouse Name: N/A  Number of Children: N/A  . Years of Education: N/A   Occupational History  . Not on file.   Social History Main Topics  . Smoking status: Never Smoker   . Smokeless tobacco: Never Used  . Alcohol Use: No  . Drug Use: No  . Sexual Activity: No   Other Topics Concern  . Not on file   Social History Narrative  . No narrative on file    Current Outpatient Prescriptions on File Prior to Visit  Medication Sig Dispense Refill  . ALPHAGAN P 0.1 % SOLN Place 1 drop into both eyes 3 (three) times daily.       [name] amiodarone (PACERONE) 200 MG tablet Take 100 mg by mouth daily. Takes in afternoon      . aspirin 325 MG tablet Take 325 mg by mouth every 4 (four) hours as needed for mild  pain.      . calcium acetate (PHOSLO) 667 MG capsule Take 1,334-2,001 mg by mouth See admin instructions. Take 2001mg  three times daily with meals and Take 1334mg   with snacks.      . dorzolamide-timolol (COSOPT) 22.3-6.8 MG/ML ophthalmic solution Place 1 drop into both eyes 2 (two) times daily.       . Fluticasone-Salmeterol (ADVAIR) 250-50 MCG/DOSE AEPB Inhale 1 puff into the lungs daily as needed (shortness of breath).       . insulin lispro protamine-lispro (HUMALOG 50/50 MIX) (50-50) 100 UNIT/ML SUSP injection Inject 10 Units into the skin daily with breakfast.  10 mL  11  . isosorbide mononitrate (IMDUR) 60 MG 24 hr tablet Take 60 mg by mouth daily. Takes in afternoon      . metoprolol (LOPRESSOR) 50 MG tablet Take 50 mg by mouth 4 (four) times a week. Take 50mg  once a day on Tuesday, Thursday, Saturday, and Sunday. Skipping Dialysis days.      . simvastatin (ZOCOR) 10 MG tablet Take 10 mg by mouth every evening.       No current facility-administered medications on file prior to visit.    Allergies  Allergen Reactions  . Ace Inhibitors Cough  . Eggs Or Egg-Derived Products Nausea And Vomiting  . Enalapril Cough  . Lisinopril Cough  . Omnipaque [Iohexol] Hives    Family History  Problem Relation Age of Onset  . Cancer Sister     colon  . Hypertension Other   . Hypertension Mother   . Coronary artery disease Mother   . Hypertension Father   . Diabetes Father     BP 132/78  Pulse 78  Temp(Src) 97.5 F (36.4 C) (Oral)  Ht 5' (1.524 m)  Wt 202 lb (91.627 kg)  BMI 39.45 kg/m2    Review of Systems Denies LOC and weight change.     Objective:   Physical Exam VITAL SIGNS:  See vs page GENERAL: no distress Pulses: dorsalis pedis absent bilat.  Feet: no edema. There is a healed surgical scar (transmetatarsal amputation) of the right foot. There is severe onychomycosis of the left toenails.  Skin: no ulcer on the feet. feet are of normal color and temp.   Neuro:  sensation is intact to touch on the feet, but decreased from normal.      Lab Results  Component Value Date   CHOL 174 10/16/2013   HDL 37.30* 10/16/2013   LDLCALC 99 10/16/2013   TRIG 188.0* 10/16/2013   CHOLHDL 5 10/16/2013        Assessment & Plan:  Dyslipidemia: well-controlled DM:  apparently overcontrolled Renal failure: this is reducing her insulin requirement.  Patient is advised the following: Patient Instructions  blood tests are being requested for you today.  We'll contact you with results. Please come back for a follow-up appointment in 3 months. check your blood sugar 2 times a day.  vary the time of day when you check, between before the 3 meals, and at bedtime.  also check if you have symptoms of your blood sugar being too high or too low.  please keep a record of the readings and bring it to your next appointment here.  please call us sooner if you are having low blood sugar episodes, or if it stays over 200.   The value of cholesterol medication in dialysis patients has been questioned.  i'm not sure what to do about this, so please work with your other doctors about this.

## 2013-10-16 NOTE — Telephone Encounter (Signed)
Noted,

## 2013-10-16 NOTE — Patient Instructions (Addendum)
blood tests are being requested for you today.  We'll contact you with results. Please come back for a follow-up appointment in 3 months. check your blood sugar 2 times a day.  vary the time of day when you check, between before the 3 meals, and at bedtime.  also check if you have symptoms of your blood sugar being too high or too low.  please keep a record of the readings and bring it to your next appointment here.  please call us sooner if you are having low blood sugar episodes, or if it stays over 200.   The value of cholesterol medication in dialysis patients has been questioned.  i'm not sure what to do about this, so please work with your other doctors about this.

## 2013-10-16 NOTE — Telephone Encounter (Signed)
Dr. Cordelia Pen patient, critical Creatinine was 5.2, previous was 8.16, GFR is 10.61, previous was 5. Please advise

## 2013-10-17 ENCOUNTER — Telehealth: Payer: Self-pay | Admitting: Endocrinology

## 2013-10-17 NOTE — Telephone Encounter (Signed)
please call patient: Can they please add a1c?

## 2013-10-20 NOTE — Telephone Encounter (Signed)
please call patient: Please come in for redraw, with my apologies

## 2013-10-20 NOTE — Telephone Encounter (Signed)
Request sent to the lab.

## 2013-10-20 NOTE — Telephone Encounter (Signed)
Lab called stating the A1C could not be added. Purple vial is needed for this blood test.

## 2013-10-20 NOTE — Telephone Encounter (Signed)
[facility] advising pt to call back and schedule lab appointment.

## 2013-11-05 ENCOUNTER — Telehealth: Payer: Self-pay | Admitting: Endocrinology

## 2013-11-05 DIAGNOSIS — E1129 Type 2 diabetes mellitus with other diabetic kidney complication: Secondary | ICD-10-CM | POA: Diagnosis not present

## 2013-11-05 NOTE — Telephone Encounter (Signed)
Patient stated that she haven't heard anything from  [facility] about her orthopaedic shoes.  Please advise

## 2013-11-05 NOTE — Telephone Encounter (Signed)
[facility]vom advising that rx for orthopedic shoes is ready for pick up.  Rx placed upfront.

## 2013-11-08 DIAGNOSIS — N186 End stage renal disease: Secondary | ICD-10-CM | POA: Diagnosis not present

## 2013-11-08 DIAGNOSIS — Z992 Dependence on renal dialysis: Secondary | ICD-10-CM | POA: Diagnosis not present

## 2013-11-10 DIAGNOSIS — D509 Iron deficiency anemia, unspecified: Secondary | ICD-10-CM | POA: Diagnosis not present

## 2013-11-10 DIAGNOSIS — N186 End stage renal disease: Secondary | ICD-10-CM | POA: Diagnosis not present

## 2013-11-10 DIAGNOSIS — E119 Type 2 diabetes mellitus without complications: Secondary | ICD-10-CM | POA: Diagnosis not present

## 2013-11-10 DIAGNOSIS — D631 Anemia in chronic kidney disease: Secondary | ICD-10-CM | POA: Diagnosis not present

## 2013-11-10 DIAGNOSIS — N2581 Secondary hyperparathyroidism of renal origin: Secondary | ICD-10-CM | POA: Diagnosis not present

## 2013-11-12 DIAGNOSIS — N2581 Secondary hyperparathyroidism of renal origin: Secondary | ICD-10-CM | POA: Diagnosis not present

## 2013-11-12 DIAGNOSIS — D509 Iron deficiency anemia, unspecified: Secondary | ICD-10-CM | POA: Diagnosis not present

## 2013-11-12 DIAGNOSIS — E119 Type 2 diabetes mellitus without complications: Secondary | ICD-10-CM | POA: Diagnosis not present

## 2013-11-12 DIAGNOSIS — N186 End stage renal disease: Secondary | ICD-10-CM | POA: Diagnosis not present

## 2013-11-12 DIAGNOSIS — D631 Anemia in chronic kidney disease: Secondary | ICD-10-CM | POA: Diagnosis not present

## 2013-11-14 DIAGNOSIS — D509 Iron deficiency anemia, unspecified: Secondary | ICD-10-CM | POA: Diagnosis not present

## 2013-11-14 DIAGNOSIS — D631 Anemia in chronic kidney disease: Secondary | ICD-10-CM | POA: Diagnosis not present

## 2013-11-14 DIAGNOSIS — E119 Type 2 diabetes mellitus without complications: Secondary | ICD-10-CM | POA: Diagnosis not present

## 2013-11-14 DIAGNOSIS — N186 End stage renal disease: Secondary | ICD-10-CM | POA: Diagnosis not present

## 2013-11-14 DIAGNOSIS — N2581 Secondary hyperparathyroidism of renal origin: Secondary | ICD-10-CM | POA: Diagnosis not present

## 2013-11-17 DIAGNOSIS — E119 Type 2 diabetes mellitus without complications: Secondary | ICD-10-CM | POA: Diagnosis not present

## 2013-11-17 DIAGNOSIS — D631 Anemia in chronic kidney disease: Secondary | ICD-10-CM | POA: Diagnosis not present

## 2013-11-17 DIAGNOSIS — D509 Iron deficiency anemia, unspecified: Secondary | ICD-10-CM | POA: Diagnosis not present

## 2013-11-17 DIAGNOSIS — N186 End stage renal disease: Secondary | ICD-10-CM | POA: Diagnosis not present

## 2013-11-17 DIAGNOSIS — N2581 Secondary hyperparathyroidism of renal origin: Secondary | ICD-10-CM | POA: Diagnosis not present

## 2013-11-19 DIAGNOSIS — N186 End stage renal disease: Secondary | ICD-10-CM | POA: Diagnosis not present

## 2013-11-19 DIAGNOSIS — N2581 Secondary hyperparathyroidism of renal origin: Secondary | ICD-10-CM | POA: Diagnosis not present

## 2013-11-19 DIAGNOSIS — D631 Anemia in chronic kidney disease: Secondary | ICD-10-CM | POA: Diagnosis not present

## 2013-11-19 DIAGNOSIS — E119 Type 2 diabetes mellitus without complications: Secondary | ICD-10-CM | POA: Diagnosis not present

## 2013-11-19 DIAGNOSIS — D509 Iron deficiency anemia, unspecified: Secondary | ICD-10-CM | POA: Diagnosis not present

## 2013-11-21 DIAGNOSIS — D509 Iron deficiency anemia, unspecified: Secondary | ICD-10-CM | POA: Diagnosis not present

## 2013-11-21 DIAGNOSIS — N2581 Secondary hyperparathyroidism of renal origin: Secondary | ICD-10-CM | POA: Diagnosis not present

## 2013-11-21 DIAGNOSIS — D631 Anemia in chronic kidney disease: Secondary | ICD-10-CM | POA: Diagnosis not present

## 2013-11-21 DIAGNOSIS — E119 Type 2 diabetes mellitus without complications: Secondary | ICD-10-CM | POA: Diagnosis not present

## 2013-11-21 DIAGNOSIS — N186 End stage renal disease: Secondary | ICD-10-CM | POA: Diagnosis not present

## 2013-11-24 DIAGNOSIS — N186 End stage renal disease: Secondary | ICD-10-CM | POA: Diagnosis not present

## 2013-11-24 DIAGNOSIS — N2581 Secondary hyperparathyroidism of renal origin: Secondary | ICD-10-CM | POA: Diagnosis not present

## 2013-11-24 DIAGNOSIS — D509 Iron deficiency anemia, unspecified: Secondary | ICD-10-CM | POA: Diagnosis not present

## 2013-11-24 DIAGNOSIS — D631 Anemia in chronic kidney disease: Secondary | ICD-10-CM | POA: Diagnosis not present

## 2013-11-24 DIAGNOSIS — E119 Type 2 diabetes mellitus without complications: Secondary | ICD-10-CM | POA: Diagnosis not present

## 2013-11-26 DIAGNOSIS — D509 Iron deficiency anemia, unspecified: Secondary | ICD-10-CM | POA: Diagnosis not present

## 2013-11-26 DIAGNOSIS — N186 End stage renal disease: Secondary | ICD-10-CM | POA: Diagnosis not present

## 2013-11-26 DIAGNOSIS — N2581 Secondary hyperparathyroidism of renal origin: Secondary | ICD-10-CM | POA: Diagnosis not present

## 2013-11-26 DIAGNOSIS — D631 Anemia in chronic kidney disease: Secondary | ICD-10-CM | POA: Diagnosis not present

## 2013-11-26 DIAGNOSIS — E119 Type 2 diabetes mellitus without complications: Secondary | ICD-10-CM | POA: Diagnosis not present

## 2013-11-28 DIAGNOSIS — E119 Type 2 diabetes mellitus without complications: Secondary | ICD-10-CM | POA: Diagnosis not present

## 2013-11-28 DIAGNOSIS — D631 Anemia in chronic kidney disease: Secondary | ICD-10-CM | POA: Diagnosis not present

## 2013-11-28 DIAGNOSIS — N2581 Secondary hyperparathyroidism of renal origin: Secondary | ICD-10-CM | POA: Diagnosis not present

## 2013-11-28 DIAGNOSIS — N186 End stage renal disease: Secondary | ICD-10-CM | POA: Diagnosis not present

## 2013-11-28 DIAGNOSIS — D509 Iron deficiency anemia, unspecified: Secondary | ICD-10-CM | POA: Diagnosis not present

## 2013-11-30 DIAGNOSIS — D509 Iron deficiency anemia, unspecified: Secondary | ICD-10-CM | POA: Diagnosis not present

## 2013-11-30 DIAGNOSIS — D631 Anemia in chronic kidney disease: Secondary | ICD-10-CM | POA: Diagnosis not present

## 2013-11-30 DIAGNOSIS — N2581 Secondary hyperparathyroidism of renal origin: Secondary | ICD-10-CM | POA: Diagnosis not present

## 2013-11-30 DIAGNOSIS — N186 End stage renal disease: Secondary | ICD-10-CM | POA: Diagnosis not present

## 2013-11-30 DIAGNOSIS — E119 Type 2 diabetes mellitus without complications: Secondary | ICD-10-CM | POA: Diagnosis not present

## 2013-12-01 DIAGNOSIS — E11351 Type 2 diabetes mellitus with proliferative diabetic retinopathy with macular edema: Secondary | ICD-10-CM | POA: Diagnosis not present

## 2013-12-01 DIAGNOSIS — Z961 Presence of intraocular lens: Secondary | ICD-10-CM | POA: Diagnosis not present

## 2013-12-01 DIAGNOSIS — H4011X2 Primary open-angle glaucoma, moderate stage: Secondary | ICD-10-CM | POA: Diagnosis not present

## 2013-12-02 DIAGNOSIS — D509 Iron deficiency anemia, unspecified: Secondary | ICD-10-CM | POA: Diagnosis not present

## 2013-12-02 DIAGNOSIS — D631 Anemia in chronic kidney disease: Secondary | ICD-10-CM | POA: Diagnosis not present

## 2013-12-02 DIAGNOSIS — N2581 Secondary hyperparathyroidism of renal origin: Secondary | ICD-10-CM | POA: Diagnosis not present

## 2013-12-02 DIAGNOSIS — N186 End stage renal disease: Secondary | ICD-10-CM | POA: Diagnosis not present

## 2013-12-02 DIAGNOSIS — E119 Type 2 diabetes mellitus without complications: Secondary | ICD-10-CM | POA: Diagnosis not present

## 2013-12-05 DIAGNOSIS — N186 End stage renal disease: Secondary | ICD-10-CM | POA: Diagnosis not present

## 2013-12-05 DIAGNOSIS — E119 Type 2 diabetes mellitus without complications: Secondary | ICD-10-CM | POA: Diagnosis not present

## 2013-12-05 DIAGNOSIS — D631 Anemia in chronic kidney disease: Secondary | ICD-10-CM | POA: Diagnosis not present

## 2013-12-05 DIAGNOSIS — N2581 Secondary hyperparathyroidism of renal origin: Secondary | ICD-10-CM | POA: Diagnosis not present

## 2013-12-05 DIAGNOSIS — D509 Iron deficiency anemia, unspecified: Secondary | ICD-10-CM | POA: Diagnosis not present

## 2013-12-08 DIAGNOSIS — D631 Anemia in chronic kidney disease: Secondary | ICD-10-CM | POA: Diagnosis not present

## 2013-12-08 DIAGNOSIS — Z992 Dependence on renal dialysis: Secondary | ICD-10-CM | POA: Diagnosis not present

## 2013-12-08 DIAGNOSIS — N2581 Secondary hyperparathyroidism of renal origin: Secondary | ICD-10-CM | POA: Diagnosis not present

## 2013-12-08 DIAGNOSIS — E119 Type 2 diabetes mellitus without complications: Secondary | ICD-10-CM | POA: Diagnosis not present

## 2013-12-08 DIAGNOSIS — D509 Iron deficiency anemia, unspecified: Secondary | ICD-10-CM | POA: Diagnosis not present

## 2013-12-08 DIAGNOSIS — N186 End stage renal disease: Secondary | ICD-10-CM | POA: Diagnosis not present

## 2013-12-08 DIAGNOSIS — N182 Chronic kidney disease, stage 2 (mild): Secondary | ICD-10-CM | POA: Diagnosis not present

## 2013-12-10 DIAGNOSIS — N186 End stage renal disease: Secondary | ICD-10-CM | POA: Diagnosis not present

## 2013-12-10 DIAGNOSIS — D509 Iron deficiency anemia, unspecified: Secondary | ICD-10-CM | POA: Diagnosis not present

## 2013-12-10 DIAGNOSIS — D631 Anemia in chronic kidney disease: Secondary | ICD-10-CM | POA: Diagnosis not present

## 2013-12-10 DIAGNOSIS — N2581 Secondary hyperparathyroidism of renal origin: Secondary | ICD-10-CM | POA: Diagnosis not present

## 2013-12-10 DIAGNOSIS — E119 Type 2 diabetes mellitus without complications: Secondary | ICD-10-CM | POA: Diagnosis not present

## 2014-01-08 DIAGNOSIS — N186 End stage renal disease: Secondary | ICD-10-CM | POA: Diagnosis not present

## 2014-01-08 DIAGNOSIS — Z992 Dependence on renal dialysis: Secondary | ICD-10-CM | POA: Diagnosis not present

## 2014-01-10 DIAGNOSIS — D631 Anemia in chronic kidney disease: Secondary | ICD-10-CM | POA: Diagnosis not present

## 2014-01-10 DIAGNOSIS — N2581 Secondary hyperparathyroidism of renal origin: Secondary | ICD-10-CM | POA: Diagnosis not present

## 2014-01-10 DIAGNOSIS — E119 Type 2 diabetes mellitus without complications: Secondary | ICD-10-CM | POA: Diagnosis not present

## 2014-01-10 DIAGNOSIS — D509 Iron deficiency anemia, unspecified: Secondary | ICD-10-CM | POA: Diagnosis not present

## 2014-01-10 DIAGNOSIS — N186 End stage renal disease: Secondary | ICD-10-CM | POA: Diagnosis not present

## 2014-01-12 DIAGNOSIS — N2581 Secondary hyperparathyroidism of renal origin: Secondary | ICD-10-CM | POA: Diagnosis not present

## 2014-01-12 DIAGNOSIS — D509 Iron deficiency anemia, unspecified: Secondary | ICD-10-CM | POA: Diagnosis not present

## 2014-01-12 DIAGNOSIS — N186 End stage renal disease: Secondary | ICD-10-CM | POA: Diagnosis not present

## 2014-01-12 DIAGNOSIS — D631 Anemia in chronic kidney disease: Secondary | ICD-10-CM | POA: Diagnosis not present

## 2014-01-12 DIAGNOSIS — E119 Type 2 diabetes mellitus without complications: Secondary | ICD-10-CM | POA: Diagnosis not present

## 2014-01-14 DIAGNOSIS — N186 End stage renal disease: Secondary | ICD-10-CM | POA: Diagnosis not present

## 2014-01-14 DIAGNOSIS — D509 Iron deficiency anemia, unspecified: Secondary | ICD-10-CM | POA: Diagnosis not present

## 2014-01-14 DIAGNOSIS — N2581 Secondary hyperparathyroidism of renal origin: Secondary | ICD-10-CM | POA: Diagnosis not present

## 2014-01-14 DIAGNOSIS — D631 Anemia in chronic kidney disease: Secondary | ICD-10-CM | POA: Diagnosis not present

## 2014-01-14 DIAGNOSIS — E119 Type 2 diabetes mellitus without complications: Secondary | ICD-10-CM | POA: Diagnosis not present

## 2014-01-15 ENCOUNTER — Ambulatory Visit (INDEPENDENT_AMBULATORY_CARE_PROVIDER_SITE_OTHER): Payer: Medicare Other | Admitting: Endocrinology

## 2014-01-15 ENCOUNTER — Encounter: Payer: Self-pay | Admitting: Endocrinology

## 2014-01-15 ENCOUNTER — Other Ambulatory Visit: Payer: Self-pay | Admitting: Internal Medicine

## 2014-01-15 VITALS — BP 132/88 | HR 65 | Temp 97.9°F | Ht 60.0 in | Wt 202.0 lb

## 2014-01-15 DIAGNOSIS — E1022 Type 1 diabetes mellitus with diabetic chronic kidney disease: Secondary | ICD-10-CM

## 2014-01-15 DIAGNOSIS — N189 Chronic kidney disease, unspecified: Secondary | ICD-10-CM | POA: Diagnosis not present

## 2014-01-15 LAB — HEMOGLOBIN A1C: Hgb A1c MFr Bld: 7.4 % — ABNORMAL HIGH (ref 4.6–6.5)

## 2014-01-15 MED ORDER — REPAGLINIDE 0.5 MG PO TABS: 0.5000 mg | ORAL_TABLET | Freq: Three times a day (TID) | ORAL | Status: DC

## 2014-01-15 NOTE — Progress Notes (Signed)
Subjective:    Patient ID: [name], female    DOB: 28-Jun-1945, 69 y.o.   MRN: [id number]  HPI Pt returns for f/u of diabetes mellitus: DM type: Insulin-requiring type 2 Dx'ed: 6568 Complications: polyneuropathy, renal failure (dialysis), PAD, CVA, CHF Therapy: insulin since 2006 GDM: never DKA: never Severe hypoglycemia: last episode was 2013 Pancreatitis: never Other: she takes qd insulin, at her request.  Interval history: no cbg record, but states cbg's are in the low-100's.  She still skips her insulin about every other day, because she worries about hypoglycemia.  pt states she feels well in general. Past Medical History  Diagnosis Date  . Hyperlipidemia   . Mitral valve insufficiency and aortic valve insufficiency   . Cardiomyopathy- mixed   . 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 Chronic diastolic heart failure   . Anemia   . Coronary artery disease     Previously decreased EF; echo 113 normal LV function  . Hernia, incisional     abd  . History of colon cancer     history of colon cancer.  1986  . Arthritis   . LBBB (left bundle branch block)   . Atrial tachycardia     on amiod  . Hypertensive heart disease     sees Dr. Alain Marion  . Stroke     2011/12  . Renal failure     ESRD, Dr Dunham/Dr. Lyda Kalata.  M, W, Fr  . Obesity   . Hypertension   . Glaucoma     Past Surgical History  Procedure Laterality Date  . Colostomy  1986  . Revision urostomy cutaneous    . Esophagogastroduodenoscopy  03-18-04  . Electrocardiogram  04-27-06  . Placement of new left forearm arteriovenous graft  03-11-08  . Left heart catheterization and right heart catheterization  12-10    R. heart cath showed elevated left and right heart filling pressures w/ pulmonary artery pressure elevated mildly out of proportion to the wedge. The left heart cath showed diffuse distal vessel disease as well as a 75% stenosis in the mid  circumflex w/ a 90% stenosis of the ostial first obtuse marginal. These lesions were in close proximity. there was a 60-70%mild RCA stenosis.   . Arteriovenous graft placement  2010  . 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 Cardiac catheterization    . Eye surgery      Cataract Left  . Pars plana vitrectomy  11/29/2011    Procedure: PARS PLANA VITRECTOMY WITH 23 GAUGE;  Surgeon: Adonis Brook, MD;  Location: [place];  Service: Ophthalmology;  Laterality: Right;  Right Eye 23 ga vitrectomy with membrane peel  . Pars plana vitrectomy Left 02/28/2012    Procedure: PARS PLANA VITRECTOMY WITH 23 GAUGE;  Surgeon: Adonis Brook, MD;  Location: [place];  Service: Ophthalmology;  Laterality: Left;  . Esophagogastroduodenoscopy N/A 02/02/2013    Procedure: ESOPHAGOGASTRODUODENOSCOPY (EGD);  Surgeon: Irene Shipper, MD;  Location: [facility] ENDOSCOPY;  Service: Endoscopy;  Laterality: N/A;  . Vaginal hysterectomy    . Colonoscopy    . Insertion of ahmed valve Right 08/20/2013    Procedure: INSERTION OF AHMED VALVE WITH Mitomycin C application;  Surgeon: Marylynn Pearson, MD;  Location: [place];  Service: Ophthalmology;  Laterality: Right;    History   Social History  . Marital Status: Single    Spouse Name: N/A  Number of Children: N/A  . Years of Education: N/A   Occupational History  . Not on file.   Social History Main Topics  . Smoking status: Never Smoker   . Smokeless tobacco: Never Used  . Alcohol Use: No  . Drug Use: No  . Sexual Activity: No   Other Topics Concern  . Not on file   Social History Narrative    Current Outpatient Prescriptions on File Prior to Visit  Medication Sig Dispense Refill  . ALPHAGAN P 0.1 % SOLN Place 1 drop into both eyes 3 (three) times daily.     [name] aspirin 325 MG tablet Take 325 mg by mouth every 4 (four) hours as needed for mild pain.    . calcium acetate (PHOSLO) 667 MG capsule Take 1,334-2,001 mg by mouth See admin  instructions. Take 2001mg  three times daily with meals and Take 1334mg   with snacks.    . dorzolamide-timolol (COSOPT) 22.3-6.8 MG/ML ophthalmic solution Place 1 drop into both eyes 2 (two) times daily.     . Fluticasone-Salmeterol (ADVAIR) 250-50 MCG/DOSE AEPB Inhale 1 puff into the lungs daily as needed (shortness of breath).     . isosorbide mononitrate (IMDUR) 60 MG 24 hr tablet Take 60 mg by mouth daily. Takes in afternoon    . metoprolol (LOPRESSOR) 50 MG tablet Take 50 mg by mouth 4 (four) times a week. Take 50mg  once a day on Tuesday, Thursday, Saturday, and Sunday. Skipping Dialysis days.    . simvastatin (ZOCOR) 10 MG tablet Take 10 mg by mouth every evening.     No current facility-administered medications on file prior to visit.    Allergies  Allergen Reactions  . Ace Inhibitors Cough  . Eggs Or Egg-Derived Products Nausea And Vomiting  . Enalapril Cough  . Lisinopril Cough  . Omnipaque [Iohexol] Hives    Family History  Problem Relation Age of Onset  . Cancer Sister     colon  . Hypertension Other   . Hypertension Mother   . Coronary artery disease Mother   . Hypertension Father   . Diabetes Father     BP 132/88 mmHg  Pulse 65  Temp(Src) 97.9 F (36.6 C) (Oral)  Ht 5' (1.524 m)  Wt 202 lb (91.627 kg)  BMI 39.45 kg/m2    Review of Systems Denies LOC and weight change.    Objective:   Physical Exam VITAL SIGNS:  See vs page GENERAL: no distress Pulses: dorsalis pedis absent bilat.  Feet: no edema. There is a healed surgical scar (transmetatarsal amputation) of the right foot. There is severe onychomycosis of the left toenails.  Skin: no ulcer on the feet. feet are of normal color and temp.   Neuro: sensation is intact to touch on the feet, but decreased from normal.    Lab Results  Component Value Date   HGBA1C 7.4* 01/15/2014       Assessment & Plan:  DM: mild exacerbation Renal failure: this is causing a decrease in DM medication  Patient  is advised the following: Patient Instructions  blood tests are being requested for you today.  We'll contact you with results. Based on the results, i hope to change the insulin to a pill called "repaglinide," that you take 3 times a day (just before each meal).  Please come back for a follow-up appointment in 3 months. check your blood sugar 2 times a day.  vary the time of day when you check, between before the 3 meals,  and at bedtime.  also check if you have symptoms of your blood sugar being too high or too low.  please keep a record of the readings and bring it to your next appointment here.  please call us sooner if you are having low blood sugar episodes, or if it stays over 200.     change insulin to repaglinide. i have sent a prescription to your pharmacy.

## 2014-01-15 NOTE — Patient Instructions (Addendum)
blood tests are being requested for you today.  We'll contact you with results. Based on the results, i hope to change the insulin to a pill called "repaglinide," that you take 3 times a day (just before each meal).  Please come back for a follow-up appointment in 3 months. check your blood sugar 2 times a day.  vary the time of day when you check, between before the 3 meals, and at bedtime.  also check if you have symptoms of your blood sugar being too high or too low.  please keep a record of the readings and bring it to your next appointment here.  please call us sooner if you are having low blood sugar episodes, or if it stays over 200.

## 2014-01-16 DIAGNOSIS — E119 Type 2 diabetes mellitus without complications: Secondary | ICD-10-CM | POA: Diagnosis not present

## 2014-01-16 DIAGNOSIS — D509 Iron deficiency anemia, unspecified: Secondary | ICD-10-CM | POA: Diagnosis not present

## 2014-01-16 DIAGNOSIS — D631 Anemia in chronic kidney disease: Secondary | ICD-10-CM | POA: Diagnosis not present

## 2014-01-16 DIAGNOSIS — N2581 Secondary hyperparathyroidism of renal origin: Secondary | ICD-10-CM | POA: Diagnosis not present

## 2014-01-16 DIAGNOSIS — N186 End stage renal disease: Secondary | ICD-10-CM | POA: Diagnosis not present

## 2014-01-19 DIAGNOSIS — N186 End stage renal disease: Secondary | ICD-10-CM | POA: Diagnosis not present

## 2014-01-19 DIAGNOSIS — D509 Iron deficiency anemia, unspecified: Secondary | ICD-10-CM | POA: Diagnosis not present

## 2014-01-19 DIAGNOSIS — E119 Type 2 diabetes mellitus without complications: Secondary | ICD-10-CM | POA: Diagnosis not present

## 2014-01-19 DIAGNOSIS — D631 Anemia in chronic kidney disease: Secondary | ICD-10-CM | POA: Diagnosis not present

## 2014-01-19 DIAGNOSIS — N2581 Secondary hyperparathyroidism of renal origin: Secondary | ICD-10-CM | POA: Diagnosis not present

## 2014-01-20 ENCOUNTER — Telehealth: Payer: Self-pay | Admitting: Endocrinology

## 2014-01-20 MED ORDER — GLUCOSE BLOOD VI STRP: ORAL_STRIP | Status: DC

## 2014-01-20 NOTE — Telephone Encounter (Signed)
Patient states she needs test strips  Glucometer: one touch ultra 2  Pharmacy: [facility]   Thank you

## 2014-01-20 NOTE — Telephone Encounter (Signed)
Rx sent to pharmacy   

## 2014-01-21 DIAGNOSIS — E119 Type 2 diabetes mellitus without complications: Secondary | ICD-10-CM | POA: Diagnosis not present

## 2014-01-21 DIAGNOSIS — N2581 Secondary hyperparathyroidism of renal origin: Secondary | ICD-10-CM | POA: Diagnosis not present

## 2014-01-21 DIAGNOSIS — D631 Anemia in chronic kidney disease: Secondary | ICD-10-CM | POA: Diagnosis not present

## 2014-01-21 DIAGNOSIS — D509 Iron deficiency anemia, unspecified: Secondary | ICD-10-CM | POA: Diagnosis not present

## 2014-01-21 DIAGNOSIS — N186 End stage renal disease: Secondary | ICD-10-CM | POA: Diagnosis not present

## 2014-01-23 DIAGNOSIS — D509 Iron deficiency anemia, unspecified: Secondary | ICD-10-CM | POA: Diagnosis not present

## 2014-01-23 DIAGNOSIS — E119 Type 2 diabetes mellitus without complications: Secondary | ICD-10-CM | POA: Diagnosis not present

## 2014-01-23 DIAGNOSIS — N2581 Secondary hyperparathyroidism of renal origin: Secondary | ICD-10-CM | POA: Diagnosis not present

## 2014-01-23 DIAGNOSIS — D631 Anemia in chronic kidney disease: Secondary | ICD-10-CM | POA: Diagnosis not present

## 2014-01-23 DIAGNOSIS — N186 End stage renal disease: Secondary | ICD-10-CM | POA: Diagnosis not present

## 2014-01-26 DIAGNOSIS — N186 End stage renal disease: Secondary | ICD-10-CM | POA: Diagnosis not present

## 2014-01-26 DIAGNOSIS — E119 Type 2 diabetes mellitus without complications: Secondary | ICD-10-CM | POA: Diagnosis not present

## 2014-01-26 DIAGNOSIS — D509 Iron deficiency anemia, unspecified: Secondary | ICD-10-CM | POA: Diagnosis not present

## 2014-01-26 DIAGNOSIS — N2581 Secondary hyperparathyroidism of renal origin: Secondary | ICD-10-CM | POA: Diagnosis not present

## 2014-01-26 DIAGNOSIS — D631 Anemia in chronic kidney disease: Secondary | ICD-10-CM | POA: Diagnosis not present

## 2014-01-28 DIAGNOSIS — D631 Anemia in chronic kidney disease: Secondary | ICD-10-CM | POA: Diagnosis not present

## 2014-01-28 DIAGNOSIS — D509 Iron deficiency anemia, unspecified: Secondary | ICD-10-CM | POA: Diagnosis not present

## 2014-01-28 DIAGNOSIS — N2581 Secondary hyperparathyroidism of renal origin: Secondary | ICD-10-CM | POA: Diagnosis not present

## 2014-01-28 DIAGNOSIS — N186 End stage renal disease: Secondary | ICD-10-CM | POA: Diagnosis not present

## 2014-01-28 DIAGNOSIS — E119 Type 2 diabetes mellitus without complications: Secondary | ICD-10-CM | POA: Diagnosis not present

## 2014-01-30 DIAGNOSIS — E119 Type 2 diabetes mellitus without complications: Secondary | ICD-10-CM | POA: Diagnosis not present

## 2014-01-30 DIAGNOSIS — D509 Iron deficiency anemia, unspecified: Secondary | ICD-10-CM | POA: Diagnosis not present

## 2014-01-30 DIAGNOSIS — N186 End stage renal disease: Secondary | ICD-10-CM | POA: Diagnosis not present

## 2014-01-30 DIAGNOSIS — N2581 Secondary hyperparathyroidism of renal origin: Secondary | ICD-10-CM | POA: Diagnosis not present

## 2014-01-30 DIAGNOSIS — D631 Anemia in chronic kidney disease: Secondary | ICD-10-CM | POA: Diagnosis not present

## 2014-02-02 DIAGNOSIS — E119 Type 2 diabetes mellitus without complications: Secondary | ICD-10-CM | POA: Diagnosis not present

## 2014-02-02 DIAGNOSIS — D631 Anemia in chronic kidney disease: Secondary | ICD-10-CM | POA: Diagnosis not present

## 2014-02-02 DIAGNOSIS — D509 Iron deficiency anemia, unspecified: Secondary | ICD-10-CM | POA: Diagnosis not present

## 2014-02-02 DIAGNOSIS — N186 End stage renal disease: Secondary | ICD-10-CM | POA: Diagnosis not present

## 2014-02-02 DIAGNOSIS — N2581 Secondary hyperparathyroidism of renal origin: Secondary | ICD-10-CM | POA: Diagnosis not present

## 2014-02-04 DIAGNOSIS — N186 End stage renal disease: Secondary | ICD-10-CM | POA: Diagnosis not present

## 2014-02-04 DIAGNOSIS — D631 Anemia in chronic kidney disease: Secondary | ICD-10-CM | POA: Diagnosis not present

## 2014-02-04 DIAGNOSIS — E119 Type 2 diabetes mellitus without complications: Secondary | ICD-10-CM | POA: Diagnosis not present

## 2014-02-04 DIAGNOSIS — E1129 Type 2 diabetes mellitus with other diabetic kidney complication: Secondary | ICD-10-CM | POA: Diagnosis not present

## 2014-02-04 DIAGNOSIS — N2581 Secondary hyperparathyroidism of renal origin: Secondary | ICD-10-CM | POA: Diagnosis not present

## 2014-02-04 DIAGNOSIS — D509 Iron deficiency anemia, unspecified: Secondary | ICD-10-CM | POA: Diagnosis not present

## 2014-02-06 DIAGNOSIS — D631 Anemia in chronic kidney disease: Secondary | ICD-10-CM | POA: Diagnosis not present

## 2014-02-06 DIAGNOSIS — N2581 Secondary hyperparathyroidism of renal origin: Secondary | ICD-10-CM | POA: Diagnosis not present

## 2014-02-06 DIAGNOSIS — D509 Iron deficiency anemia, unspecified: Secondary | ICD-10-CM | POA: Diagnosis not present

## 2014-02-06 DIAGNOSIS — N186 End stage renal disease: Secondary | ICD-10-CM | POA: Diagnosis not present

## 2014-02-06 DIAGNOSIS — E119 Type 2 diabetes mellitus without complications: Secondary | ICD-10-CM | POA: Diagnosis not present

## 2014-02-08 DIAGNOSIS — N186 End stage renal disease: Secondary | ICD-10-CM | POA: Diagnosis not present

## 2014-02-08 DIAGNOSIS — Z992 Dependence on renal dialysis: Secondary | ICD-10-CM | POA: Diagnosis not present

## 2014-02-09 DIAGNOSIS — D631 Anemia in chronic kidney disease: Secondary | ICD-10-CM | POA: Diagnosis not present

## 2014-02-09 DIAGNOSIS — R109 Unspecified abdominal pain: Secondary | ICD-10-CM | POA: Diagnosis not present

## 2014-02-09 DIAGNOSIS — N2581 Secondary hyperparathyroidism of renal origin: Secondary | ICD-10-CM | POA: Diagnosis not present

## 2014-02-09 DIAGNOSIS — E119 Type 2 diabetes mellitus without complications: Secondary | ICD-10-CM | POA: Diagnosis not present

## 2014-02-09 DIAGNOSIS — N186 End stage renal disease: Secondary | ICD-10-CM | POA: Diagnosis not present

## 2014-02-09 DIAGNOSIS — D509 Iron deficiency anemia, unspecified: Secondary | ICD-10-CM | POA: Diagnosis not present

## 2014-02-09 DIAGNOSIS — Z87440 Personal history of urinary (tract) infections: Secondary | ICD-10-CM | POA: Diagnosis not present

## 2014-02-09 DIAGNOSIS — E1129 Type 2 diabetes mellitus with other diabetic kidney complication: Secondary | ICD-10-CM | POA: Diagnosis not present

## 2014-02-11 DIAGNOSIS — N2581 Secondary hyperparathyroidism of renal origin: Secondary | ICD-10-CM | POA: Diagnosis not present

## 2014-02-11 DIAGNOSIS — Z87440 Personal history of urinary (tract) infections: Secondary | ICD-10-CM | POA: Diagnosis not present

## 2014-02-11 DIAGNOSIS — D509 Iron deficiency anemia, unspecified: Secondary | ICD-10-CM | POA: Diagnosis not present

## 2014-02-11 DIAGNOSIS — E119 Type 2 diabetes mellitus without complications: Secondary | ICD-10-CM | POA: Diagnosis not present

## 2014-02-11 DIAGNOSIS — R109 Unspecified abdominal pain: Secondary | ICD-10-CM | POA: Diagnosis not present

## 2014-02-11 DIAGNOSIS — N186 End stage renal disease: Secondary | ICD-10-CM | POA: Diagnosis not present

## 2014-02-13 DIAGNOSIS — Z87440 Personal history of urinary (tract) infections: Secondary | ICD-10-CM | POA: Diagnosis not present

## 2014-02-13 DIAGNOSIS — R109 Unspecified abdominal pain: Secondary | ICD-10-CM | POA: Diagnosis not present

## 2014-02-13 DIAGNOSIS — D509 Iron deficiency anemia, unspecified: Secondary | ICD-10-CM | POA: Diagnosis not present

## 2014-02-13 DIAGNOSIS — N186 End stage renal disease: Secondary | ICD-10-CM | POA: Diagnosis not present

## 2014-02-13 DIAGNOSIS — N2581 Secondary hyperparathyroidism of renal origin: Secondary | ICD-10-CM | POA: Diagnosis not present

## 2014-02-13 DIAGNOSIS — E119 Type 2 diabetes mellitus without complications: Secondary | ICD-10-CM | POA: Diagnosis not present

## 2014-02-16 DIAGNOSIS — N186 End stage renal disease: Secondary | ICD-10-CM | POA: Diagnosis not present

## 2014-02-16 DIAGNOSIS — Z87440 Personal history of urinary (tract) infections: Secondary | ICD-10-CM | POA: Diagnosis not present

## 2014-02-16 DIAGNOSIS — N2581 Secondary hyperparathyroidism of renal origin: Secondary | ICD-10-CM | POA: Diagnosis not present

## 2014-02-16 DIAGNOSIS — D509 Iron deficiency anemia, unspecified: Secondary | ICD-10-CM | POA: Diagnosis not present

## 2014-02-16 DIAGNOSIS — E119 Type 2 diabetes mellitus without complications: Secondary | ICD-10-CM | POA: Diagnosis not present

## 2014-02-16 DIAGNOSIS — R109 Unspecified abdominal pain: Secondary | ICD-10-CM | POA: Diagnosis not present

## 2014-02-18 DIAGNOSIS — R109 Unspecified abdominal pain: Secondary | ICD-10-CM | POA: Diagnosis not present

## 2014-02-18 DIAGNOSIS — N186 End stage renal disease: Secondary | ICD-10-CM | POA: Diagnosis not present

## 2014-02-18 DIAGNOSIS — N2581 Secondary hyperparathyroidism of renal origin: Secondary | ICD-10-CM | POA: Diagnosis not present

## 2014-02-18 DIAGNOSIS — Z87440 Personal history of urinary (tract) infections: Secondary | ICD-10-CM | POA: Diagnosis not present

## 2014-02-18 DIAGNOSIS — E119 Type 2 diabetes mellitus without complications: Secondary | ICD-10-CM | POA: Diagnosis not present

## 2014-02-18 DIAGNOSIS — D509 Iron deficiency anemia, unspecified: Secondary | ICD-10-CM | POA: Diagnosis not present

## 2014-02-20 DIAGNOSIS — Z87440 Personal history of urinary (tract) infections: Secondary | ICD-10-CM | POA: Diagnosis not present

## 2014-02-20 DIAGNOSIS — E119 Type 2 diabetes mellitus without complications: Secondary | ICD-10-CM | POA: Diagnosis not present

## 2014-02-20 DIAGNOSIS — N2581 Secondary hyperparathyroidism of renal origin: Secondary | ICD-10-CM | POA: Diagnosis not present

## 2014-02-20 DIAGNOSIS — D509 Iron deficiency anemia, unspecified: Secondary | ICD-10-CM | POA: Diagnosis not present

## 2014-02-20 DIAGNOSIS — R109 Unspecified abdominal pain: Secondary | ICD-10-CM | POA: Diagnosis not present

## 2014-02-20 DIAGNOSIS — N186 End stage renal disease: Secondary | ICD-10-CM | POA: Diagnosis not present

## 2014-02-23 DIAGNOSIS — D509 Iron deficiency anemia, unspecified: Secondary | ICD-10-CM | POA: Diagnosis not present

## 2014-02-23 DIAGNOSIS — N186 End stage renal disease: Secondary | ICD-10-CM | POA: Diagnosis not present

## 2014-02-23 DIAGNOSIS — Z87440 Personal history of urinary (tract) infections: Secondary | ICD-10-CM | POA: Diagnosis not present

## 2014-02-23 DIAGNOSIS — R109 Unspecified abdominal pain: Secondary | ICD-10-CM | POA: Diagnosis not present

## 2014-02-23 DIAGNOSIS — N2581 Secondary hyperparathyroidism of renal origin: Secondary | ICD-10-CM | POA: Diagnosis not present

## 2014-02-23 DIAGNOSIS — E119 Type 2 diabetes mellitus without complications: Secondary | ICD-10-CM | POA: Diagnosis not present

## 2014-02-24 ENCOUNTER — Encounter: Payer: Self-pay | Admitting: Internal Medicine

## 2014-02-24 ENCOUNTER — Ambulatory Visit (INDEPENDENT_AMBULATORY_CARE_PROVIDER_SITE_OTHER): Payer: Medicare Other | Admitting: Internal Medicine

## 2014-02-24 VITALS — BP 108/50 | HR 78 | Ht 60.0 in | Wt 205.8 lb

## 2014-02-24 DIAGNOSIS — I471 Supraventricular tachycardia: Secondary | ICD-10-CM

## 2014-02-24 DIAGNOSIS — Z79899 Other long term (current) drug therapy: Secondary | ICD-10-CM | POA: Diagnosis not present

## 2014-02-24 MED ORDER — AMIODARONE HCL 200 MG PO TABS: 100.0000 mg | ORAL_TABLET | Freq: Every day | ORAL | Status: DC

## 2014-02-24 NOTE — Progress Notes (Signed)
.      Patient Care Team: Cassandria Anger, MD as PCP - General Louis Meckel, MD (Nephrology) Deboraha Sprang, MD (Cardiology) Renato Shin, MD as Attending Physician (Internal Medicine) Gatha Mayer, MD as Consulting Physician (Gastroenterology)   HPI  [name] is a 69 y.o. female Seen in followup for atrial tachycardia treated and responsive to amiodarone.  She has end-stage renal disease and is on dialysis.  Remotely she had an echo that demonstrated severe left ventricular dysfunction at about 25%. She developed recurrent tachycardia in December 2010 with a non-STEMI. Catheterization demonstrated 60% mid RCA; subtotal occlusion of the posterior descending 70 AV circ disease and a 30% proximal LAD. She also had distal disease in her diagonal and her LAD.  Ejection fraction by echocardiogram 1/15 was 55-60%. Echo description also however, suggests severe mitral stenosis. However, she has no symptoms of dyspnea or orthopnea. She has chronic left bundle branch block  She has no complaints of chest pain   She is in need of amiodarone surveillance laboratories   Past Medical History  Diagnosis Date  . Hyperlipidemia   . Mitral valve insufficiency and aortic valve insufficiency   . Cardiomyopathy- mixed   . 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 Chronic diastolic heart failure   . Anemia   . Coronary artery disease     Previously decreased EF; echo 113 normal LV function  . Hernia, incisional     abd  . History of colon cancer     history of colon cancer.  1986  . Arthritis   . LBBB (left bundle branch block)   . Atrial tachycardia     on amiod  . Hypertensive heart disease     sees Dr. Alain Marion  . Stroke     2011/12  . Renal failure     ESRD, Dr Dunham/Dr. Lyda Kalata.  M, W, Fr  . Obesity   . Hypertension   . Glaucoma     Past Surgical History  Procedure Laterality Date  . Colostomy  1986    . Revision urostomy cutaneous    . Esophagogastroduodenoscopy  03-18-04  . Electrocardiogram  04-27-06  . Placement of new left forearm arteriovenous graft  03-11-08  . Left heart catheterization and right heart catheterization  12-10    R. heart cath showed elevated left and right heart filling pressures w/ pulmonary artery pressure elevated mildly out of proportion to the wedge. The left heart cath showed diffuse distal vessel disease as well as a 75% stenosis in the mid circumflex w/ a 90% stenosis of the ostial first obtuse marginal. These lesions were in close proximity. there was a 60-70%mild RCA stenosis.   . Arteriovenous graft placement  2010  . 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 Cardiac catheterization    . Eye surgery      Cataract Left  . Pars plana vitrectomy  11/29/2011    Procedure: PARS PLANA VITRECTOMY WITH 23 GAUGE;  Surgeon: Adonis Brook, MD;  Location: [place];  Service: Ophthalmology;  Laterality: Right;  Right Eye 23 ga vitrectomy with membrane peel  . Pars plana vitrectomy Left 02/28/2012    Procedure: PARS PLANA VITRECTOMY WITH 23 GAUGE;  Surgeon: Adonis Brook, MD;  Location: [place];  Service: Ophthalmology;  Laterality: Left;  . Esophagogastroduodenoscopy N/A 02/02/2013    Procedure: ESOPHAGOGASTRODUODENOSCOPY (EGD);  Surgeon: Irene Shipper,  MD;  Location: [place] ENDOSCOPY;  Service: Endoscopy;  Laterality: N/A;  . Vaginal hysterectomy    . Colonoscopy    . Insertion of ahmed valve Right 08/20/2013    Procedure: INSERTION OF AHMED VALVE WITH Mitomycin C application;  Surgeon: Marylynn Pearson, MD;  Location: [place];  Service: Ophthalmology;  Laterality: Right;    Current Outpatient Prescriptions  Medication Sig Dispense Refill  . ALPHAGAN P 0.1 % SOLN Place 1 drop into both eyes 3 (three) times daily.     [name] amiodarone (PACERONE) 200 MG tablet TAKE 1/2 TABLET BY MOUTH EVERY DAY 45 tablet 0  . aspirin 325 MG tablet Take 325 mg by mouth every  4 (four) hours as needed for mild pain.    . calcium acetate (PHOSLO) 667 MG capsule Take 1,334-2,001 mg by mouth See admin instructions. Take 2001mg  three times daily with meals and Take 1334mg   with snacks.    . dorzolamide-timolol (COSOPT) 22.3-6.8 MG/ML ophthalmic solution Place 1 drop into both eyes 2 (two) times daily.     . Fluticasone-Salmeterol (ADVAIR) 250-50 MCG/DOSE AEPB Inhale 1 puff into the lungs daily as needed (shortness of breath).     [name] glucose blood (ONE TOUCH ULTRA TEST) test strip Check blood sugar 2 time per day. 200 each 2  . HUMALOG MIX 50/50 (50-50) 100 UNIT/ML SUSP injection Inject 100 Units as directed as directed. Sliding Scale  11  . isosorbide mononitrate (IMDUR) 60 MG 24 hr tablet Take 60 mg by mouth daily. Takes in afternoon    . metoprolol (LOPRESSOR) 50 MG tablet Take 50 mg by mouth 4 (four) times a week. Take 50mg  once a day on Tuesday, Thursday, Saturday, and Sunday. Skipping Dialysis days.    . repaglinide (PRANDIN) 0.5 MG tablet Take 1 tablet (0.5 mg total) by mouth 3 (three) times daily before meals. 90 tablet 11  . simvastatin (ZOCOR) 10 MG tablet Take 10 mg by mouth every evening.     No current facility-administered medications for this visit.    Allergies  Allergen Reactions  . Ace Inhibitors Cough  . Eggs Or Egg-Derived Products Nausea And Vomiting  . Enalapril Cough  . Lisinopril Cough  . Omnipaque [Iohexol] Hives    Review of Systems negative except from HPI and PMH  Physical Exam BP 108/50 mmHg  Pulse 78  Ht 5' (1.524 m)  Wt 205 lb 12.8 oz (93.35 kg)  BMI 40.19 kg/m2 Well developed and well nourished in no acute distress HENT normal E scleral and icterus clear Neck Supple JVP flat; carotids brisk and full Clear to ausculation  Regular rate and rhythm,  2/6 systolic murmur with a split S2 Soft with active bowel sounds No clubbing cyanosis  Edema Alert and oriented, grossly normal motor and sensory function Skin Warm and Dry    ECG demonstrates sinus rhythm at 90 Intervals 30/16/44   Assessment and  Plan  Atrial tachycardia   Amiodarone therapy-chronic   First-degree AV block-progressive  Aortic stenosis-mild   Mitral stenosis??    Overall she is doing pretty well. I anticipate that her amiodarone surveillance laboratories are being followed by the nephrology team and I've been in contact with Dr. Earnestine Leys  and had asked her to forward these labs to [place].  We will see her again in 6 months time. I'm concerned about progression in her AV nodal conduction with first-degree heart block. This may be a consequence of amiodarone and will require either discontinuation or perhaps pacing.

## 2014-02-24 NOTE — Patient Instructions (Signed)
Your physician recommends that you continue on your current medications as directed. Please refer to the Current Medication list given to you today.   Your physician wants you to follow-up in: 6 months Dr. Gari Crown will receive a reminder letter in the mail two months in advance. If you don't receive a letter, please call our office to schedule the follow-up appointment.

## 2014-02-25 DIAGNOSIS — Z87440 Personal history of urinary (tract) infections: Secondary | ICD-10-CM | POA: Diagnosis not present

## 2014-02-25 DIAGNOSIS — N2581 Secondary hyperparathyroidism of renal origin: Secondary | ICD-10-CM | POA: Diagnosis not present

## 2014-02-25 DIAGNOSIS — R109 Unspecified abdominal pain: Secondary | ICD-10-CM | POA: Diagnosis not present

## 2014-02-25 DIAGNOSIS — N186 End stage renal disease: Secondary | ICD-10-CM | POA: Diagnosis not present

## 2014-02-25 DIAGNOSIS — E119 Type 2 diabetes mellitus without complications: Secondary | ICD-10-CM | POA: Diagnosis not present

## 2014-02-25 DIAGNOSIS — D509 Iron deficiency anemia, unspecified: Secondary | ICD-10-CM | POA: Diagnosis not present

## 2014-02-26 DIAGNOSIS — Z961 Presence of intraocular lens: Secondary | ICD-10-CM | POA: Diagnosis not present

## 2014-02-26 DIAGNOSIS — E11351 Type 2 diabetes mellitus with proliferative diabetic retinopathy with macular edema: Secondary | ICD-10-CM | POA: Diagnosis not present

## 2014-02-26 DIAGNOSIS — H4011X2 Primary open-angle glaucoma, moderate stage: Secondary | ICD-10-CM | POA: Diagnosis not present

## 2014-02-27 DIAGNOSIS — E119 Type 2 diabetes mellitus without complications: Secondary | ICD-10-CM | POA: Diagnosis not present

## 2014-02-27 DIAGNOSIS — N2581 Secondary hyperparathyroidism of renal origin: Secondary | ICD-10-CM | POA: Diagnosis not present

## 2014-02-27 DIAGNOSIS — Z87440 Personal history of urinary (tract) infections: Secondary | ICD-10-CM | POA: Diagnosis not present

## 2014-02-27 DIAGNOSIS — N186 End stage renal disease: Secondary | ICD-10-CM | POA: Diagnosis not present

## 2014-02-27 DIAGNOSIS — R109 Unspecified abdominal pain: Secondary | ICD-10-CM | POA: Diagnosis not present

## 2014-02-27 DIAGNOSIS — D509 Iron deficiency anemia, unspecified: Secondary | ICD-10-CM | POA: Diagnosis not present

## 2014-03-02 DIAGNOSIS — N186 End stage renal disease: Secondary | ICD-10-CM | POA: Diagnosis not present

## 2014-03-02 DIAGNOSIS — N2581 Secondary hyperparathyroidism of renal origin: Secondary | ICD-10-CM | POA: Diagnosis not present

## 2014-03-02 DIAGNOSIS — R109 Unspecified abdominal pain: Secondary | ICD-10-CM | POA: Diagnosis not present

## 2014-03-02 DIAGNOSIS — E119 Type 2 diabetes mellitus without complications: Secondary | ICD-10-CM | POA: Diagnosis not present

## 2014-03-02 DIAGNOSIS — D509 Iron deficiency anemia, unspecified: Secondary | ICD-10-CM | POA: Diagnosis not present

## 2014-03-02 DIAGNOSIS — Z87440 Personal history of urinary (tract) infections: Secondary | ICD-10-CM | POA: Diagnosis not present

## 2014-03-04 DIAGNOSIS — R109 Unspecified abdominal pain: Secondary | ICD-10-CM | POA: Diagnosis not present

## 2014-03-04 DIAGNOSIS — E119 Type 2 diabetes mellitus without complications: Secondary | ICD-10-CM | POA: Diagnosis not present

## 2014-03-04 DIAGNOSIS — N2581 Secondary hyperparathyroidism of renal origin: Secondary | ICD-10-CM | POA: Diagnosis not present

## 2014-03-04 DIAGNOSIS — Z87440 Personal history of urinary (tract) infections: Secondary | ICD-10-CM | POA: Diagnosis not present

## 2014-03-04 DIAGNOSIS — D509 Iron deficiency anemia, unspecified: Secondary | ICD-10-CM | POA: Diagnosis not present

## 2014-03-04 DIAGNOSIS — N186 End stage renal disease: Secondary | ICD-10-CM | POA: Diagnosis not present

## 2014-03-06 DIAGNOSIS — N390 Urinary tract infection, site not specified: Secondary | ICD-10-CM | POA: Diagnosis not present

## 2014-03-06 DIAGNOSIS — R109 Unspecified abdominal pain: Secondary | ICD-10-CM | POA: Diagnosis not present

## 2014-03-06 DIAGNOSIS — N186 End stage renal disease: Secondary | ICD-10-CM | POA: Diagnosis not present

## 2014-03-06 DIAGNOSIS — E119 Type 2 diabetes mellitus without complications: Secondary | ICD-10-CM | POA: Diagnosis not present

## 2014-03-06 DIAGNOSIS — D509 Iron deficiency anemia, unspecified: Secondary | ICD-10-CM | POA: Diagnosis not present

## 2014-03-06 DIAGNOSIS — N2581 Secondary hyperparathyroidism of renal origin: Secondary | ICD-10-CM | POA: Diagnosis not present

## 2014-03-06 DIAGNOSIS — Z87440 Personal history of urinary (tract) infections: Secondary | ICD-10-CM | POA: Diagnosis not present

## 2014-03-09 ENCOUNTER — Telehealth: Payer: Self-pay | Admitting: Endocrinology

## 2014-03-09 DIAGNOSIS — N2581 Secondary hyperparathyroidism of renal origin: Secondary | ICD-10-CM | POA: Diagnosis not present

## 2014-03-09 DIAGNOSIS — D509 Iron deficiency anemia, unspecified: Secondary | ICD-10-CM | POA: Diagnosis not present

## 2014-03-09 DIAGNOSIS — E119 Type 2 diabetes mellitus without complications: Secondary | ICD-10-CM | POA: Diagnosis not present

## 2014-03-09 DIAGNOSIS — Z992 Dependence on renal dialysis: Secondary | ICD-10-CM | POA: Diagnosis not present

## 2014-03-09 DIAGNOSIS — N186 End stage renal disease: Secondary | ICD-10-CM | POA: Diagnosis not present

## 2014-03-09 DIAGNOSIS — Z87440 Personal history of urinary (tract) infections: Secondary | ICD-10-CM | POA: Diagnosis not present

## 2014-03-09 DIAGNOSIS — R109 Unspecified abdominal pain: Secondary | ICD-10-CM | POA: Diagnosis not present

## 2014-03-09 MED ORDER — GLUCOSE BLOOD VI STRP: ORAL_STRIP | Status: DC

## 2014-03-09 NOTE — Telephone Encounter (Signed)
Pt

## 2014-03-09 NOTE — Telephone Encounter (Signed)
Patient stated that insurance Co. said Dr Loanne Drilling denied her insulin and she would like to know why, she need feed back today.Please advise

## 2014-03-09 NOTE — Telephone Encounter (Signed)
Our records show the insulin was changed to a pill.  Are you still taking the insulin?

## 2014-03-09 NOTE — Telephone Encounter (Signed)
Please advise if ok to refill humalog 50/50. Pt states she is using 10 units with her largest meals. Ok to sent? Thanks!

## 2014-03-10 MED ORDER — HUMALOG MIX 50/50 (50-50) 100 UNIT/ML ~~LOC~~ SUSP: SUBCUTANEOUS | Status: DC

## 2014-03-10 NOTE — Telephone Encounter (Signed)
Please refill x 1 Ov is due  

## 2014-03-10 NOTE — Telephone Encounter (Signed)
Pt notified that Rx has been sent. Pt scheduled for 03/19/2014 at 1045 am.

## 2014-03-10 NOTE — Telephone Encounter (Signed)
Pt stated that since her last OV she has been taking 10 units of Humalog 50/50 with her largest meal of day.

## 2014-03-11 DIAGNOSIS — Z87440 Personal history of urinary (tract) infections: Secondary | ICD-10-CM | POA: Diagnosis not present

## 2014-03-11 DIAGNOSIS — N2581 Secondary hyperparathyroidism of renal origin: Secondary | ICD-10-CM | POA: Diagnosis not present

## 2014-03-11 DIAGNOSIS — D631 Anemia in chronic kidney disease: Secondary | ICD-10-CM | POA: Diagnosis not present

## 2014-03-11 DIAGNOSIS — N186 End stage renal disease: Secondary | ICD-10-CM | POA: Diagnosis not present

## 2014-03-11 DIAGNOSIS — E1129 Type 2 diabetes mellitus with other diabetic kidney complication: Secondary | ICD-10-CM | POA: Diagnosis not present

## 2014-03-11 DIAGNOSIS — D509 Iron deficiency anemia, unspecified: Secondary | ICD-10-CM | POA: Diagnosis not present

## 2014-03-13 DIAGNOSIS — N2581 Secondary hyperparathyroidism of renal origin: Secondary | ICD-10-CM | POA: Diagnosis not present

## 2014-03-13 DIAGNOSIS — D631 Anemia in chronic kidney disease: Secondary | ICD-10-CM | POA: Diagnosis not present

## 2014-03-13 DIAGNOSIS — D509 Iron deficiency anemia, unspecified: Secondary | ICD-10-CM | POA: Diagnosis not present

## 2014-03-13 DIAGNOSIS — E1129 Type 2 diabetes mellitus with other diabetic kidney complication: Secondary | ICD-10-CM | POA: Diagnosis not present

## 2014-03-13 DIAGNOSIS — Z87440 Personal history of urinary (tract) infections: Secondary | ICD-10-CM | POA: Diagnosis not present

## 2014-03-13 DIAGNOSIS — N186 End stage renal disease: Secondary | ICD-10-CM | POA: Diagnosis not present

## 2014-03-16 DIAGNOSIS — Z87440 Personal history of urinary (tract) infections: Secondary | ICD-10-CM | POA: Diagnosis not present

## 2014-03-16 DIAGNOSIS — N2581 Secondary hyperparathyroidism of renal origin: Secondary | ICD-10-CM | POA: Diagnosis not present

## 2014-03-16 DIAGNOSIS — N186 End stage renal disease: Secondary | ICD-10-CM | POA: Diagnosis not present

## 2014-03-16 DIAGNOSIS — E1129 Type 2 diabetes mellitus with other diabetic kidney complication: Secondary | ICD-10-CM | POA: Diagnosis not present

## 2014-03-16 DIAGNOSIS — D631 Anemia in chronic kidney disease: Secondary | ICD-10-CM | POA: Diagnosis not present

## 2014-03-16 DIAGNOSIS — D509 Iron deficiency anemia, unspecified: Secondary | ICD-10-CM | POA: Diagnosis not present

## 2014-03-18 DIAGNOSIS — E1129 Type 2 diabetes mellitus with other diabetic kidney complication: Secondary | ICD-10-CM | POA: Diagnosis not present

## 2014-03-18 DIAGNOSIS — D631 Anemia in chronic kidney disease: Secondary | ICD-10-CM | POA: Diagnosis not present

## 2014-03-18 DIAGNOSIS — N186 End stage renal disease: Secondary | ICD-10-CM | POA: Diagnosis not present

## 2014-03-18 DIAGNOSIS — D509 Iron deficiency anemia, unspecified: Secondary | ICD-10-CM | POA: Diagnosis not present

## 2014-03-18 DIAGNOSIS — N2581 Secondary hyperparathyroidism of renal origin: Secondary | ICD-10-CM | POA: Diagnosis not present

## 2014-03-18 DIAGNOSIS — Z87440 Personal history of urinary (tract) infections: Secondary | ICD-10-CM | POA: Diagnosis not present

## 2014-03-19 ENCOUNTER — Ambulatory Visit: Payer: PRIVATE HEALTH INSURANCE | Admitting: Endocrinology

## 2014-03-20 DIAGNOSIS — Z87440 Personal history of urinary (tract) infections: Secondary | ICD-10-CM | POA: Diagnosis not present

## 2014-03-20 DIAGNOSIS — E1129 Type 2 diabetes mellitus with other diabetic kidney complication: Secondary | ICD-10-CM | POA: Diagnosis not present

## 2014-03-20 DIAGNOSIS — N2581 Secondary hyperparathyroidism of renal origin: Secondary | ICD-10-CM | POA: Diagnosis not present

## 2014-03-20 DIAGNOSIS — D509 Iron deficiency anemia, unspecified: Secondary | ICD-10-CM | POA: Diagnosis not present

## 2014-03-20 DIAGNOSIS — N186 End stage renal disease: Secondary | ICD-10-CM | POA: Diagnosis not present

## 2014-03-20 DIAGNOSIS — D631 Anemia in chronic kidney disease: Secondary | ICD-10-CM | POA: Diagnosis not present

## 2014-03-23 DIAGNOSIS — Z87440 Personal history of urinary (tract) infections: Secondary | ICD-10-CM | POA: Diagnosis not present

## 2014-03-23 DIAGNOSIS — N186 End stage renal disease: Secondary | ICD-10-CM | POA: Diagnosis not present

## 2014-03-23 DIAGNOSIS — D631 Anemia in chronic kidney disease: Secondary | ICD-10-CM | POA: Diagnosis not present

## 2014-03-23 DIAGNOSIS — E1129 Type 2 diabetes mellitus with other diabetic kidney complication: Secondary | ICD-10-CM | POA: Diagnosis not present

## 2014-03-23 DIAGNOSIS — N2581 Secondary hyperparathyroidism of renal origin: Secondary | ICD-10-CM | POA: Diagnosis not present

## 2014-03-23 DIAGNOSIS — D509 Iron deficiency anemia, unspecified: Secondary | ICD-10-CM | POA: Diagnosis not present

## 2014-03-24 ENCOUNTER — Other Ambulatory Visit: Payer: Self-pay

## 2014-03-25 DIAGNOSIS — N186 End stage renal disease: Secondary | ICD-10-CM | POA: Diagnosis not present

## 2014-03-25 DIAGNOSIS — Z87440 Personal history of urinary (tract) infections: Secondary | ICD-10-CM | POA: Diagnosis not present

## 2014-03-25 DIAGNOSIS — E1129 Type 2 diabetes mellitus with other diabetic kidney complication: Secondary | ICD-10-CM | POA: Diagnosis not present

## 2014-03-25 DIAGNOSIS — N2581 Secondary hyperparathyroidism of renal origin: Secondary | ICD-10-CM | POA: Diagnosis not present

## 2014-03-25 DIAGNOSIS — D631 Anemia in chronic kidney disease: Secondary | ICD-10-CM | POA: Diagnosis not present

## 2014-03-25 DIAGNOSIS — D509 Iron deficiency anemia, unspecified: Secondary | ICD-10-CM | POA: Diagnosis not present

## 2014-03-27 DIAGNOSIS — N2581 Secondary hyperparathyroidism of renal origin: Secondary | ICD-10-CM | POA: Diagnosis not present

## 2014-03-27 DIAGNOSIS — Z87440 Personal history of urinary (tract) infections: Secondary | ICD-10-CM | POA: Diagnosis not present

## 2014-03-27 DIAGNOSIS — D509 Iron deficiency anemia, unspecified: Secondary | ICD-10-CM | POA: Diagnosis not present

## 2014-03-27 DIAGNOSIS — E1129 Type 2 diabetes mellitus with other diabetic kidney complication: Secondary | ICD-10-CM | POA: Diagnosis not present

## 2014-03-27 DIAGNOSIS — D631 Anemia in chronic kidney disease: Secondary | ICD-10-CM | POA: Diagnosis not present

## 2014-03-27 DIAGNOSIS — N186 End stage renal disease: Secondary | ICD-10-CM | POA: Diagnosis not present

## 2014-03-30 DIAGNOSIS — D509 Iron deficiency anemia, unspecified: Secondary | ICD-10-CM | POA: Diagnosis not present

## 2014-03-30 DIAGNOSIS — D631 Anemia in chronic kidney disease: Secondary | ICD-10-CM | POA: Diagnosis not present

## 2014-03-30 DIAGNOSIS — Z87440 Personal history of urinary (tract) infections: Secondary | ICD-10-CM | POA: Diagnosis not present

## 2014-03-30 DIAGNOSIS — N2581 Secondary hyperparathyroidism of renal origin: Secondary | ICD-10-CM | POA: Diagnosis not present

## 2014-03-30 DIAGNOSIS — N186 End stage renal disease: Secondary | ICD-10-CM | POA: Diagnosis not present

## 2014-03-30 DIAGNOSIS — E1129 Type 2 diabetes mellitus with other diabetic kidney complication: Secondary | ICD-10-CM | POA: Diagnosis not present

## 2014-04-01 DIAGNOSIS — E1129 Type 2 diabetes mellitus with other diabetic kidney complication: Secondary | ICD-10-CM | POA: Diagnosis not present

## 2014-04-01 DIAGNOSIS — D509 Iron deficiency anemia, unspecified: Secondary | ICD-10-CM | POA: Diagnosis not present

## 2014-04-01 DIAGNOSIS — N2581 Secondary hyperparathyroidism of renal origin: Secondary | ICD-10-CM | POA: Diagnosis not present

## 2014-04-01 DIAGNOSIS — D631 Anemia in chronic kidney disease: Secondary | ICD-10-CM | POA: Diagnosis not present

## 2014-04-01 DIAGNOSIS — Z87440 Personal history of urinary (tract) infections: Secondary | ICD-10-CM | POA: Diagnosis not present

## 2014-04-01 DIAGNOSIS — N186 End stage renal disease: Secondary | ICD-10-CM | POA: Diagnosis not present

## 2014-04-03 DIAGNOSIS — N2581 Secondary hyperparathyroidism of renal origin: Secondary | ICD-10-CM | POA: Diagnosis not present

## 2014-04-03 DIAGNOSIS — D631 Anemia in chronic kidney disease: Secondary | ICD-10-CM | POA: Diagnosis not present

## 2014-04-03 DIAGNOSIS — D509 Iron deficiency anemia, unspecified: Secondary | ICD-10-CM | POA: Diagnosis not present

## 2014-04-03 DIAGNOSIS — N186 End stage renal disease: Secondary | ICD-10-CM | POA: Diagnosis not present

## 2014-04-03 DIAGNOSIS — Z87440 Personal history of urinary (tract) infections: Secondary | ICD-10-CM | POA: Diagnosis not present

## 2014-04-03 DIAGNOSIS — E1129 Type 2 diabetes mellitus with other diabetic kidney complication: Secondary | ICD-10-CM | POA: Diagnosis not present

## 2014-04-06 DIAGNOSIS — D631 Anemia in chronic kidney disease: Secondary | ICD-10-CM | POA: Diagnosis not present

## 2014-04-06 DIAGNOSIS — N2581 Secondary hyperparathyroidism of renal origin: Secondary | ICD-10-CM | POA: Diagnosis not present

## 2014-04-06 DIAGNOSIS — D509 Iron deficiency anemia, unspecified: Secondary | ICD-10-CM | POA: Diagnosis not present

## 2014-04-06 DIAGNOSIS — Z87440 Personal history of urinary (tract) infections: Secondary | ICD-10-CM | POA: Diagnosis not present

## 2014-04-06 DIAGNOSIS — N186 End stage renal disease: Secondary | ICD-10-CM | POA: Diagnosis not present

## 2014-04-06 DIAGNOSIS — E1129 Type 2 diabetes mellitus with other diabetic kidney complication: Secondary | ICD-10-CM | POA: Diagnosis not present

## 2014-04-08 DIAGNOSIS — E1129 Type 2 diabetes mellitus with other diabetic kidney complication: Secondary | ICD-10-CM | POA: Diagnosis not present

## 2014-04-08 DIAGNOSIS — D509 Iron deficiency anemia, unspecified: Secondary | ICD-10-CM | POA: Diagnosis not present

## 2014-04-08 DIAGNOSIS — D631 Anemia in chronic kidney disease: Secondary | ICD-10-CM | POA: Diagnosis not present

## 2014-04-08 DIAGNOSIS — N186 End stage renal disease: Secondary | ICD-10-CM | POA: Diagnosis not present

## 2014-04-08 DIAGNOSIS — Z87440 Personal history of urinary (tract) infections: Secondary | ICD-10-CM | POA: Diagnosis not present

## 2014-04-08 DIAGNOSIS — N2581 Secondary hyperparathyroidism of renal origin: Secondary | ICD-10-CM | POA: Diagnosis not present

## 2014-04-09 DIAGNOSIS — N186 End stage renal disease: Secondary | ICD-10-CM | POA: Diagnosis not present

## 2014-04-09 DIAGNOSIS — Z961 Presence of intraocular lens: Secondary | ICD-10-CM | POA: Diagnosis not present

## 2014-04-09 DIAGNOSIS — H4011X2 Primary open-angle glaucoma, moderate stage: Secondary | ICD-10-CM | POA: Diagnosis not present

## 2014-04-09 DIAGNOSIS — E11351 Type 2 diabetes mellitus with proliferative diabetic retinopathy with macular edema: Secondary | ICD-10-CM | POA: Diagnosis not present

## 2014-04-09 DIAGNOSIS — E1129 Type 2 diabetes mellitus with other diabetic kidney complication: Secondary | ICD-10-CM | POA: Diagnosis not present

## 2014-04-09 DIAGNOSIS — Z992 Dependence on renal dialysis: Secondary | ICD-10-CM | POA: Diagnosis not present

## 2014-04-10 DIAGNOSIS — E119 Type 2 diabetes mellitus without complications: Secondary | ICD-10-CM | POA: Diagnosis not present

## 2014-04-10 DIAGNOSIS — N2581 Secondary hyperparathyroidism of renal origin: Secondary | ICD-10-CM | POA: Diagnosis not present

## 2014-04-10 DIAGNOSIS — N186 End stage renal disease: Secondary | ICD-10-CM | POA: Diagnosis not present

## 2014-04-10 DIAGNOSIS — D509 Iron deficiency anemia, unspecified: Secondary | ICD-10-CM | POA: Diagnosis not present

## 2014-04-10 DIAGNOSIS — D631 Anemia in chronic kidney disease: Secondary | ICD-10-CM | POA: Diagnosis not present

## 2014-04-13 DIAGNOSIS — N186 End stage renal disease: Secondary | ICD-10-CM | POA: Diagnosis not present

## 2014-04-13 DIAGNOSIS — E119 Type 2 diabetes mellitus without complications: Secondary | ICD-10-CM | POA: Diagnosis not present

## 2014-04-13 DIAGNOSIS — D509 Iron deficiency anemia, unspecified: Secondary | ICD-10-CM | POA: Diagnosis not present

## 2014-04-13 DIAGNOSIS — D631 Anemia in chronic kidney disease: Secondary | ICD-10-CM | POA: Diagnosis not present

## 2014-04-13 DIAGNOSIS — N2581 Secondary hyperparathyroidism of renal origin: Secondary | ICD-10-CM | POA: Diagnosis not present

## 2014-04-14 DIAGNOSIS — E11351 Type 2 diabetes mellitus with proliferative diabetic retinopathy with macular edema: Secondary | ICD-10-CM | POA: Diagnosis not present

## 2014-04-14 DIAGNOSIS — H4089 Other specified glaucoma: Secondary | ICD-10-CM | POA: Diagnosis not present

## 2014-04-15 DIAGNOSIS — D631 Anemia in chronic kidney disease: Secondary | ICD-10-CM | POA: Diagnosis not present

## 2014-04-15 DIAGNOSIS — N186 End stage renal disease: Secondary | ICD-10-CM | POA: Diagnosis not present

## 2014-04-15 DIAGNOSIS — E119 Type 2 diabetes mellitus without complications: Secondary | ICD-10-CM | POA: Diagnosis not present

## 2014-04-15 DIAGNOSIS — N2581 Secondary hyperparathyroidism of renal origin: Secondary | ICD-10-CM | POA: Diagnosis not present

## 2014-04-15 DIAGNOSIS — D509 Iron deficiency anemia, unspecified: Secondary | ICD-10-CM | POA: Diagnosis not present

## 2014-04-16 ENCOUNTER — Ambulatory Visit (INDEPENDENT_AMBULATORY_CARE_PROVIDER_SITE_OTHER): Payer: Medicare Other | Admitting: Endocrinology

## 2014-04-16 ENCOUNTER — Encounter: Payer: Self-pay | Admitting: Endocrinology

## 2014-04-16 VITALS — BP 140/78 | HR 90 | Temp 97.6°F | Ht 60.0 in | Wt 203.0 lb

## 2014-04-16 DIAGNOSIS — N189 Chronic kidney disease, unspecified: Secondary | ICD-10-CM

## 2014-04-16 DIAGNOSIS — E1022 Type 1 diabetes mellitus with diabetic chronic kidney disease: Secondary | ICD-10-CM

## 2014-04-16 MED ORDER — REPAGLINIDE 0.5 MG PO TABS: 0.5000 mg | ORAL_TABLET | Freq: Three times a day (TID) | ORAL | Status: DC

## 2014-04-16 NOTE — Progress Notes (Signed)
Subjective:    Patient ID: [name], female    DOB: [birth year], 69 y.o.   MRN: [id number]  HPI Pt returns for f/u of diabetes mellitus: DM type: Insulin-requiring type 2 Dx'ed: 8756 Complications: polyneuropathy, renal failure (dialysis), PAD, CVA, CHF, and right foot transmetatarsal amputation.   Therapy: insulin since 2006 GDM: never DKA: never Severe hypoglycemia: last episode was 2013 Pancreatitis: never Other: she takes qd insulin, at her request.  Interval history: no cbg record, but states cbg's are well-controlled.  Pt says she never changed from insulin to repaglinide.  pt states she feels well in general. Past Medical History  Diagnosis Date  . Hyperlipidemia   . Mitral valve insufficiency and aortic valve insufficiency   . Cardiomyopathy- mixed   . 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 Chronic diastolic heart failure   . Anemia   . Coronary artery disease     Previously decreased EF; echo 113 normal LV function  . Hernia, incisional     abd  . History of colon cancer     history of colon cancer.  1986  . Arthritis   . LBBB (left bundle branch block)   . Atrial tachycardia     on amiod  . Hypertensive heart disease     sees Dr. Alain Marion  . Stroke     2011/12  . Renal failure     ESRD, Dr Dunham/Dr. Lyda Kalata.  M, W, Fr  . Obesity   . Hypertension   . Glaucoma     Past Surgical History  Procedure Laterality Date  . Colostomy  1986  . Revision urostomy cutaneous    . Esophagogastroduodenoscopy  03-18-04  . Electrocardiogram  04-27-06  . Placement of new left forearm arteriovenous graft  03-11-08  . Left heart catheterization and right heart catheterization  12-10    R. heart cath showed elevated left and right heart filling pressures w/ pulmonary artery pressure elevated mildly out of proportion to the wedge. The left heart cath showed diffuse distal vessel disease as well as a 75% stenosis in the  mid circumflex w/ a 90% stenosis of the ostial first obtuse marginal. These lesions were in close proximity. there was a 60-70%mild RCA stenosis.   . Arteriovenous graft placement  2010  . 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 Cardiac catheterization    . Eye surgery      Cataract Left  . Pars plana vitrectomy  11/29/2011    Procedure: PARS PLANA VITRECTOMY WITH 23 GAUGE;  Surgeon: Adonis Brook, MD;  Location: [place];  Service: Ophthalmology;  Laterality: Right;  Right Eye 23 ga vitrectomy with membrane peel  . Pars plana vitrectomy Left 02/28/2012    Procedure: PARS PLANA VITRECTOMY WITH 23 GAUGE;  Surgeon: Adonis Brook, MD;  Location: [place];  Service: Ophthalmology;  Laterality: Left;  . Esophagogastroduodenoscopy N/A 02/02/2013    Procedure: ESOPHAGOGASTRODUODENOSCOPY (EGD);  Surgeon: Irene Shipper, MD;  Location: [facility] ENDOSCOPY;  Service: Endoscopy;  Laterality: N/A;  . Vaginal hysterectomy    . Colonoscopy    . Insertion of ahmed valve Right 08/20/2013    Procedure: INSERTION OF AHMED VALVE WITH Mitomycin C application;  Surgeon: Marylynn Pearson, MD;  Location: [place];  Service: Ophthalmology;  Laterality: Right;    History   Social History  . Marital Status: Single    Spouse Name: N/A  . Number  of Children: N/A  . Years of Education: N/A   Occupational History  . Not on file.   Social History Main Topics  . Smoking status: Never Smoker   . Smokeless tobacco: Never Used  . Alcohol Use: No  . Drug Use: No  . Sexual Activity: No   Other Topics Concern  . Not on file   Social History Narrative    Current Outpatient Prescriptions on File Prior to Visit  Medication Sig Dispense Refill  . ALPHAGAN P 0.1 % SOLN Place 1 drop into both eyes 3 (three) times daily.     [name] amiodarone (PACERONE) 200 MG tablet Take 0.5 tablets (100 mg total) by mouth daily. 45 tablet 6  . aspirin 325 MG tablet Take 325 mg by mouth every 4 (four) hours as needed for  mild pain.    . calcium acetate (PHOSLO) 667 MG capsule Take 1,334-2,001 mg by mouth See admin instructions. Take 2001mg  three times daily with meals and Take 1334mg   with snacks.    . dorzolamide-timolol (COSOPT) 22.3-6.8 MG/ML ophthalmic solution Place 1 drop into both eyes 2 (two) times daily.     . Fluticasone-Salmeterol (ADVAIR) 250-50 MCG/DOSE AEPB Inhale 1 puff into the lungs daily as needed (shortness of breath).     [name] glucose blood (ONE TOUCH ULTRA TEST) test strip Check blood sugar 2 time per day. 200 each 2  . isosorbide mononitrate (IMDUR) 60 MG 24 hr tablet Take 60 mg by mouth daily. Takes in afternoon    . metoprolol (LOPRESSOR) 50 MG tablet Take 50 mg by mouth 4 (four) times a week. Take 50mg  once a day on Tuesday, Thursday, Saturday, and Sunday. Skipping Dialysis days.    . simvastatin (ZOCOR) 10 MG tablet Take 10 mg by mouth every evening.     No current facility-administered medications on file prior to visit.    Allergies  Allergen Reactions  . Ace Inhibitors Cough  . Eggs Or Egg-Derived Products Nausea And Vomiting  . Enalapril Cough  . Lisinopril Cough  . Omnipaque [Iohexol] Hives    Family History  Problem Relation Age of Onset  . Cancer Sister     colon  . Hypertension Other   . Hypertension Mother   . Coronary artery disease Mother   . Hypertension Father   . Diabetes Father     BP 140/78 mmHg  Pulse 90  Temp(Src) 97.6 F (36.4 C) (Oral)  Ht 5' (1.524 m)  Wt 203 lb (92.08 kg)  BMI 39.65 kg/m2  SpO2 96%    Review of Systems She denies hypoglycemia and weight change    Objective:   Physical Exam VITAL SIGNS:  See vs page GENERAL: no distress Pulses: dorsalis pedis absent bilat.  Feet: no edema. There is a healed surgical scar (transmetatarsal amputation) of the right foot. There is severe onychomycosis of the left toenails.  Skin: no ulcer on the feet. feet are of normal color and temp.   Neuro: sensation is intact to touch on the feet, but  decreased from normal.   Lab Results  Component Value Date   HGBA1C 7.4* 01/15/2014       Assessment & Plan:  DM: she can go back to oral rx.   Patient is advised the following: Patient Instructions  Please change the insulin to a pill called "repaglinide," that you take 3 times a day (just before each meal). i have sent a prescription to your pharmacy.  Please come back for a follow-up appointment  in 2 months.   check your blood sugar 2 times a day.  vary the time of day when you check, between before the 3 meals, and at bedtime.  also check if you have symptoms of your blood sugar being too high or too low.  please keep a record of the readings and bring it to your next appointment here.  please call us sooner if you are having low blood sugar episodes, or if it stays over 200.

## 2014-04-16 NOTE — Patient Instructions (Addendum)
Please change the insulin to a pill called "repaglinide," that you take 3 times a day (just before each meal). i have sent a prescription to your pharmacy.  Please come back for a follow-up appointment in 2 months.   check your blood sugar 2 times a day.  vary the time of day when you check, between before the 3 meals, and at bedtime.  also check if you have symptoms of your blood sugar being too high or too low.  please keep a record of the readings and bring it to your next appointment here.  please call us sooner if you are having low blood sugar episodes, or if it stays over 200.

## 2014-04-17 DIAGNOSIS — D509 Iron deficiency anemia, unspecified: Secondary | ICD-10-CM | POA: Diagnosis not present

## 2014-04-17 DIAGNOSIS — N186 End stage renal disease: Secondary | ICD-10-CM | POA: Diagnosis not present

## 2014-04-17 DIAGNOSIS — N2581 Secondary hyperparathyroidism of renal origin: Secondary | ICD-10-CM | POA: Diagnosis not present

## 2014-04-17 DIAGNOSIS — D631 Anemia in chronic kidney disease: Secondary | ICD-10-CM | POA: Diagnosis not present

## 2014-04-17 DIAGNOSIS — E119 Type 2 diabetes mellitus without complications: Secondary | ICD-10-CM | POA: Diagnosis not present

## 2014-04-20 DIAGNOSIS — N186 End stage renal disease: Secondary | ICD-10-CM | POA: Diagnosis not present

## 2014-04-20 DIAGNOSIS — E119 Type 2 diabetes mellitus without complications: Secondary | ICD-10-CM | POA: Diagnosis not present

## 2014-04-20 DIAGNOSIS — D631 Anemia in chronic kidney disease: Secondary | ICD-10-CM | POA: Diagnosis not present

## 2014-04-20 DIAGNOSIS — D509 Iron deficiency anemia, unspecified: Secondary | ICD-10-CM | POA: Diagnosis not present

## 2014-04-20 DIAGNOSIS — N2581 Secondary hyperparathyroidism of renal origin: Secondary | ICD-10-CM | POA: Diagnosis not present

## 2014-04-21 DIAGNOSIS — E11351 Type 2 diabetes mellitus with proliferative diabetic retinopathy with macular edema: Secondary | ICD-10-CM | POA: Diagnosis not present

## 2014-04-21 DIAGNOSIS — H4089 Other specified glaucoma: Secondary | ICD-10-CM | POA: Diagnosis not present

## 2014-04-22 DIAGNOSIS — N186 End stage renal disease: Secondary | ICD-10-CM | POA: Diagnosis not present

## 2014-04-22 DIAGNOSIS — D509 Iron deficiency anemia, unspecified: Secondary | ICD-10-CM | POA: Diagnosis not present

## 2014-04-22 DIAGNOSIS — D631 Anemia in chronic kidney disease: Secondary | ICD-10-CM | POA: Diagnosis not present

## 2014-04-22 DIAGNOSIS — E119 Type 2 diabetes mellitus without complications: Secondary | ICD-10-CM | POA: Diagnosis not present

## 2014-04-22 DIAGNOSIS — N2581 Secondary hyperparathyroidism of renal origin: Secondary | ICD-10-CM | POA: Diagnosis not present

## 2014-04-24 DIAGNOSIS — E119 Type 2 diabetes mellitus without complications: Secondary | ICD-10-CM | POA: Diagnosis not present

## 2014-04-24 DIAGNOSIS — N186 End stage renal disease: Secondary | ICD-10-CM | POA: Diagnosis not present

## 2014-04-24 DIAGNOSIS — D509 Iron deficiency anemia, unspecified: Secondary | ICD-10-CM | POA: Diagnosis not present

## 2014-04-24 DIAGNOSIS — N2581 Secondary hyperparathyroidism of renal origin: Secondary | ICD-10-CM | POA: Diagnosis not present

## 2014-04-24 DIAGNOSIS — D631 Anemia in chronic kidney disease: Secondary | ICD-10-CM | POA: Diagnosis not present

## 2014-04-27 DIAGNOSIS — N186 End stage renal disease: Secondary | ICD-10-CM | POA: Diagnosis not present

## 2014-04-27 DIAGNOSIS — D631 Anemia in chronic kidney disease: Secondary | ICD-10-CM | POA: Diagnosis not present

## 2014-04-27 DIAGNOSIS — N2581 Secondary hyperparathyroidism of renal origin: Secondary | ICD-10-CM | POA: Diagnosis not present

## 2014-04-27 DIAGNOSIS — E119 Type 2 diabetes mellitus without complications: Secondary | ICD-10-CM | POA: Diagnosis not present

## 2014-04-27 DIAGNOSIS — D509 Iron deficiency anemia, unspecified: Secondary | ICD-10-CM | POA: Diagnosis not present

## 2014-04-29 ENCOUNTER — Other Ambulatory Visit: Payer: Self-pay

## 2014-04-29 DIAGNOSIS — N186 End stage renal disease: Secondary | ICD-10-CM | POA: Diagnosis not present

## 2014-04-29 DIAGNOSIS — D631 Anemia in chronic kidney disease: Secondary | ICD-10-CM | POA: Diagnosis not present

## 2014-04-29 DIAGNOSIS — E119 Type 2 diabetes mellitus without complications: Secondary | ICD-10-CM | POA: Diagnosis not present

## 2014-04-29 DIAGNOSIS — D509 Iron deficiency anemia, unspecified: Secondary | ICD-10-CM | POA: Diagnosis not present

## 2014-04-29 DIAGNOSIS — N2581 Secondary hyperparathyroidism of renal origin: Secondary | ICD-10-CM | POA: Diagnosis not present

## 2014-04-29 MED ORDER — ISOSORBIDE MONONITRATE ER 60 MG PO TB24: 60.0000 mg | ORAL_TABLET | Freq: Every day | ORAL | Status: DC

## 2014-05-01 DIAGNOSIS — D509 Iron deficiency anemia, unspecified: Secondary | ICD-10-CM | POA: Diagnosis not present

## 2014-05-01 DIAGNOSIS — D631 Anemia in chronic kidney disease: Secondary | ICD-10-CM | POA: Diagnosis not present

## 2014-05-01 DIAGNOSIS — N186 End stage renal disease: Secondary | ICD-10-CM | POA: Diagnosis not present

## 2014-05-01 DIAGNOSIS — E119 Type 2 diabetes mellitus without complications: Secondary | ICD-10-CM | POA: Diagnosis not present

## 2014-05-01 DIAGNOSIS — N2581 Secondary hyperparathyroidism of renal origin: Secondary | ICD-10-CM | POA: Diagnosis not present

## 2014-05-04 DIAGNOSIS — N2581 Secondary hyperparathyroidism of renal origin: Secondary | ICD-10-CM | POA: Diagnosis not present

## 2014-05-04 DIAGNOSIS — N186 End stage renal disease: Secondary | ICD-10-CM | POA: Diagnosis not present

## 2014-05-04 DIAGNOSIS — E119 Type 2 diabetes mellitus without complications: Secondary | ICD-10-CM | POA: Diagnosis not present

## 2014-05-04 DIAGNOSIS — D631 Anemia in chronic kidney disease: Secondary | ICD-10-CM | POA: Diagnosis not present

## 2014-05-04 DIAGNOSIS — D509 Iron deficiency anemia, unspecified: Secondary | ICD-10-CM | POA: Diagnosis not present

## 2014-05-06 DIAGNOSIS — D631 Anemia in chronic kidney disease: Secondary | ICD-10-CM | POA: Diagnosis not present

## 2014-05-06 DIAGNOSIS — E119 Type 2 diabetes mellitus without complications: Secondary | ICD-10-CM | POA: Diagnosis not present

## 2014-05-06 DIAGNOSIS — N2581 Secondary hyperparathyroidism of renal origin: Secondary | ICD-10-CM | POA: Diagnosis not present

## 2014-05-06 DIAGNOSIS — N186 End stage renal disease: Secondary | ICD-10-CM | POA: Diagnosis not present

## 2014-05-06 DIAGNOSIS — E1129 Type 2 diabetes mellitus with other diabetic kidney complication: Secondary | ICD-10-CM | POA: Diagnosis not present

## 2014-05-06 DIAGNOSIS — D509 Iron deficiency anemia, unspecified: Secondary | ICD-10-CM | POA: Diagnosis not present

## 2014-05-08 DIAGNOSIS — N186 End stage renal disease: Secondary | ICD-10-CM | POA: Diagnosis not present

## 2014-05-08 DIAGNOSIS — N2581 Secondary hyperparathyroidism of renal origin: Secondary | ICD-10-CM | POA: Diagnosis not present

## 2014-05-08 DIAGNOSIS — D631 Anemia in chronic kidney disease: Secondary | ICD-10-CM | POA: Diagnosis not present

## 2014-05-08 DIAGNOSIS — E119 Type 2 diabetes mellitus without complications: Secondary | ICD-10-CM | POA: Diagnosis not present

## 2014-05-08 DIAGNOSIS — D509 Iron deficiency anemia, unspecified: Secondary | ICD-10-CM | POA: Diagnosis not present

## 2014-05-09 DIAGNOSIS — Z992 Dependence on renal dialysis: Secondary | ICD-10-CM | POA: Diagnosis not present

## 2014-05-09 DIAGNOSIS — N186 End stage renal disease: Secondary | ICD-10-CM | POA: Diagnosis not present

## 2014-05-09 DIAGNOSIS — E1129 Type 2 diabetes mellitus with other diabetic kidney complication: Secondary | ICD-10-CM | POA: Diagnosis not present

## 2014-05-11 DIAGNOSIS — N2581 Secondary hyperparathyroidism of renal origin: Secondary | ICD-10-CM | POA: Diagnosis not present

## 2014-05-11 DIAGNOSIS — N186 End stage renal disease: Secondary | ICD-10-CM | POA: Diagnosis not present

## 2014-05-11 DIAGNOSIS — D509 Iron deficiency anemia, unspecified: Secondary | ICD-10-CM | POA: Diagnosis not present

## 2014-05-11 DIAGNOSIS — D631 Anemia in chronic kidney disease: Secondary | ICD-10-CM | POA: Diagnosis not present

## 2014-05-11 DIAGNOSIS — E119 Type 2 diabetes mellitus without complications: Secondary | ICD-10-CM | POA: Diagnosis not present

## 2014-05-13 DIAGNOSIS — N2581 Secondary hyperparathyroidism of renal origin: Secondary | ICD-10-CM | POA: Diagnosis not present

## 2014-05-13 DIAGNOSIS — D509 Iron deficiency anemia, unspecified: Secondary | ICD-10-CM | POA: Diagnosis not present

## 2014-05-13 DIAGNOSIS — D631 Anemia in chronic kidney disease: Secondary | ICD-10-CM | POA: Diagnosis not present

## 2014-05-13 DIAGNOSIS — N186 End stage renal disease: Secondary | ICD-10-CM | POA: Diagnosis not present

## 2014-05-13 DIAGNOSIS — E119 Type 2 diabetes mellitus without complications: Secondary | ICD-10-CM | POA: Diagnosis not present

## 2014-05-15 DIAGNOSIS — N186 End stage renal disease: Secondary | ICD-10-CM | POA: Diagnosis not present

## 2014-05-15 DIAGNOSIS — E119 Type 2 diabetes mellitus without complications: Secondary | ICD-10-CM | POA: Diagnosis not present

## 2014-05-15 DIAGNOSIS — N2581 Secondary hyperparathyroidism of renal origin: Secondary | ICD-10-CM | POA: Diagnosis not present

## 2014-05-15 DIAGNOSIS — D631 Anemia in chronic kidney disease: Secondary | ICD-10-CM | POA: Diagnosis not present

## 2014-05-15 DIAGNOSIS — D509 Iron deficiency anemia, unspecified: Secondary | ICD-10-CM | POA: Diagnosis not present

## 2014-05-18 DIAGNOSIS — D631 Anemia in chronic kidney disease: Secondary | ICD-10-CM | POA: Diagnosis not present

## 2014-05-18 DIAGNOSIS — D509 Iron deficiency anemia, unspecified: Secondary | ICD-10-CM | POA: Diagnosis not present

## 2014-05-18 DIAGNOSIS — N2581 Secondary hyperparathyroidism of renal origin: Secondary | ICD-10-CM | POA: Diagnosis not present

## 2014-05-18 DIAGNOSIS — E119 Type 2 diabetes mellitus without complications: Secondary | ICD-10-CM | POA: Diagnosis not present

## 2014-05-18 DIAGNOSIS — N186 End stage renal disease: Secondary | ICD-10-CM | POA: Diagnosis not present

## 2014-05-20 DIAGNOSIS — D631 Anemia in chronic kidney disease: Secondary | ICD-10-CM | POA: Diagnosis not present

## 2014-05-20 DIAGNOSIS — E119 Type 2 diabetes mellitus without complications: Secondary | ICD-10-CM | POA: Diagnosis not present

## 2014-05-20 DIAGNOSIS — N186 End stage renal disease: Secondary | ICD-10-CM | POA: Diagnosis not present

## 2014-05-20 DIAGNOSIS — N2581 Secondary hyperparathyroidism of renal origin: Secondary | ICD-10-CM | POA: Diagnosis not present

## 2014-05-20 DIAGNOSIS — D509 Iron deficiency anemia, unspecified: Secondary | ICD-10-CM | POA: Diagnosis not present

## 2014-05-22 DIAGNOSIS — N186 End stage renal disease: Secondary | ICD-10-CM | POA: Diagnosis not present

## 2014-05-22 DIAGNOSIS — D631 Anemia in chronic kidney disease: Secondary | ICD-10-CM | POA: Diagnosis not present

## 2014-05-22 DIAGNOSIS — N2581 Secondary hyperparathyroidism of renal origin: Secondary | ICD-10-CM | POA: Diagnosis not present

## 2014-05-22 DIAGNOSIS — D509 Iron deficiency anemia, unspecified: Secondary | ICD-10-CM | POA: Diagnosis not present

## 2014-05-22 DIAGNOSIS — E119 Type 2 diabetes mellitus without complications: Secondary | ICD-10-CM | POA: Diagnosis not present

## 2014-05-25 DIAGNOSIS — E119 Type 2 diabetes mellitus without complications: Secondary | ICD-10-CM | POA: Diagnosis not present

## 2014-05-25 DIAGNOSIS — N186 End stage renal disease: Secondary | ICD-10-CM | POA: Diagnosis not present

## 2014-05-25 DIAGNOSIS — D631 Anemia in chronic kidney disease: Secondary | ICD-10-CM | POA: Diagnosis not present

## 2014-05-25 DIAGNOSIS — N2581 Secondary hyperparathyroidism of renal origin: Secondary | ICD-10-CM | POA: Diagnosis not present

## 2014-05-25 DIAGNOSIS — D509 Iron deficiency anemia, unspecified: Secondary | ICD-10-CM | POA: Diagnosis not present

## 2014-05-27 DIAGNOSIS — D509 Iron deficiency anemia, unspecified: Secondary | ICD-10-CM | POA: Diagnosis not present

## 2014-05-27 DIAGNOSIS — E119 Type 2 diabetes mellitus without complications: Secondary | ICD-10-CM | POA: Diagnosis not present

## 2014-05-27 DIAGNOSIS — N2581 Secondary hyperparathyroidism of renal origin: Secondary | ICD-10-CM | POA: Diagnosis not present

## 2014-05-27 DIAGNOSIS — N186 End stage renal disease: Secondary | ICD-10-CM | POA: Diagnosis not present

## 2014-05-27 DIAGNOSIS — D631 Anemia in chronic kidney disease: Secondary | ICD-10-CM | POA: Diagnosis not present

## 2014-05-28 DIAGNOSIS — H4089 Other specified glaucoma: Secondary | ICD-10-CM | POA: Diagnosis not present

## 2014-05-28 DIAGNOSIS — E11351 Type 2 diabetes mellitus with proliferative diabetic retinopathy with macular edema: Secondary | ICD-10-CM | POA: Diagnosis not present

## 2014-05-29 DIAGNOSIS — D509 Iron deficiency anemia, unspecified: Secondary | ICD-10-CM | POA: Diagnosis not present

## 2014-05-29 DIAGNOSIS — N2581 Secondary hyperparathyroidism of renal origin: Secondary | ICD-10-CM | POA: Diagnosis not present

## 2014-05-29 DIAGNOSIS — N186 End stage renal disease: Secondary | ICD-10-CM | POA: Diagnosis not present

## 2014-05-29 DIAGNOSIS — E119 Type 2 diabetes mellitus without complications: Secondary | ICD-10-CM | POA: Diagnosis not present

## 2014-05-29 DIAGNOSIS — D631 Anemia in chronic kidney disease: Secondary | ICD-10-CM | POA: Diagnosis not present

## 2014-06-01 DIAGNOSIS — N2581 Secondary hyperparathyroidism of renal origin: Secondary | ICD-10-CM | POA: Diagnosis not present

## 2014-06-01 DIAGNOSIS — D631 Anemia in chronic kidney disease: Secondary | ICD-10-CM | POA: Diagnosis not present

## 2014-06-01 DIAGNOSIS — E119 Type 2 diabetes mellitus without complications: Secondary | ICD-10-CM | POA: Diagnosis not present

## 2014-06-01 DIAGNOSIS — N186 End stage renal disease: Secondary | ICD-10-CM | POA: Diagnosis not present

## 2014-06-01 DIAGNOSIS — D509 Iron deficiency anemia, unspecified: Secondary | ICD-10-CM | POA: Diagnosis not present

## 2014-06-02 ENCOUNTER — Telehealth: Payer: Self-pay | Admitting: Endocrinology

## 2014-06-02 MED ORDER — GLUCOSE BLOOD VI STRP: ORAL_STRIP | Status: DC

## 2014-06-02 NOTE — Telephone Encounter (Signed)
Patients daughter came to the office today requesting the pharmacy receive a testing instruction form (CNM?) The form needs to specify testing in order to receive her test strips   Please fax to pharmacy    Thank you

## 2014-06-02 NOTE — Telephone Encounter (Signed)
Rx sent per daughters request.

## 2014-06-03 DIAGNOSIS — N2581 Secondary hyperparathyroidism of renal origin: Secondary | ICD-10-CM | POA: Diagnosis not present

## 2014-06-03 DIAGNOSIS — D631 Anemia in chronic kidney disease: Secondary | ICD-10-CM | POA: Diagnosis not present

## 2014-06-03 DIAGNOSIS — E119 Type 2 diabetes mellitus without complications: Secondary | ICD-10-CM | POA: Diagnosis not present

## 2014-06-03 DIAGNOSIS — N186 End stage renal disease: Secondary | ICD-10-CM | POA: Diagnosis not present

## 2014-06-03 DIAGNOSIS — D509 Iron deficiency anemia, unspecified: Secondary | ICD-10-CM | POA: Diagnosis not present

## 2014-06-05 DIAGNOSIS — D509 Iron deficiency anemia, unspecified: Secondary | ICD-10-CM | POA: Diagnosis not present

## 2014-06-05 DIAGNOSIS — N2581 Secondary hyperparathyroidism of renal origin: Secondary | ICD-10-CM | POA: Diagnosis not present

## 2014-06-05 DIAGNOSIS — N186 End stage renal disease: Secondary | ICD-10-CM | POA: Diagnosis not present

## 2014-06-05 DIAGNOSIS — D631 Anemia in chronic kidney disease: Secondary | ICD-10-CM | POA: Diagnosis not present

## 2014-06-05 DIAGNOSIS — E119 Type 2 diabetes mellitus without complications: Secondary | ICD-10-CM | POA: Diagnosis not present

## 2014-06-08 DIAGNOSIS — D631 Anemia in chronic kidney disease: Secondary | ICD-10-CM | POA: Diagnosis not present

## 2014-06-08 DIAGNOSIS — N186 End stage renal disease: Secondary | ICD-10-CM | POA: Diagnosis not present

## 2014-06-08 DIAGNOSIS — E119 Type 2 diabetes mellitus without complications: Secondary | ICD-10-CM | POA: Diagnosis not present

## 2014-06-08 DIAGNOSIS — N2581 Secondary hyperparathyroidism of renal origin: Secondary | ICD-10-CM | POA: Diagnosis not present

## 2014-06-08 DIAGNOSIS — D509 Iron deficiency anemia, unspecified: Secondary | ICD-10-CM | POA: Diagnosis not present

## 2014-06-09 ENCOUNTER — Telehealth: Payer: Self-pay | Admitting: Endocrinology

## 2014-06-09 ENCOUNTER — Encounter: Payer: Self-pay | Admitting: Adult Health

## 2014-06-09 ENCOUNTER — Ambulatory Visit (INDEPENDENT_AMBULATORY_CARE_PROVIDER_SITE_OTHER): Payer: Medicare Other | Admitting: Adult Health

## 2014-06-09 VITALS — BP 108/70 | HR 93 | Temp 98.6°F | Wt 206.2 lb

## 2014-06-09 DIAGNOSIS — R05 Cough: Secondary | ICD-10-CM

## 2014-06-09 DIAGNOSIS — R059 Cough, unspecified: Secondary | ICD-10-CM

## 2014-06-09 DIAGNOSIS — Z992 Dependence on renal dialysis: Secondary | ICD-10-CM | POA: Diagnosis not present

## 2014-06-09 DIAGNOSIS — E1129 Type 2 diabetes mellitus with other diabetic kidney complication: Secondary | ICD-10-CM | POA: Diagnosis not present

## 2014-06-09 DIAGNOSIS — N186 End stage renal disease: Secondary | ICD-10-CM | POA: Diagnosis not present

## 2014-06-09 NOTE — Progress Notes (Signed)
Pre visit review using our clinic review tool, if applicable. No additional management support is needed unless otherwise documented below in the visit note. 

## 2014-06-09 NOTE — Telephone Encounter (Signed)
I contacted the patients pharmacy and advised we do not have the CMN forms in our office. I requested the form be sent to our office so we could fill the information out for the patient. I left a voicemail advising the patient I had received her message and I am waiting on the pharmacy to send the CMN form so we can complete the form. Requested a call back if the patient would like to discuss.

## 2014-06-09 NOTE — Progress Notes (Signed)
Subjective:    Patient ID: [name], female    DOB: 08-22-1945, 69 y.o.   MRN: [id number]  HPI  Patients presents to the office for non productive cough for less than 24 hours. She feels as though something is stuck in her throat. She took robitussin this morning and she has not had any issues since then.   She has seasonal allergies.     Review of Systems  Constitutional: Negative for fever, chills, diaphoresis and activity change.  HENT: Negative for congestion, ear discharge, ear pain, postnasal drip, rhinorrhea, sinus pressure, sore throat, trouble swallowing and voice change.   Respiratory: Negative for cough, chest tightness, shortness of breath and wheezing.   Cardiovascular: Negative for chest pain and palpitations.  All other systems reviewed and are negative.  Past Medical History  Diagnosis Date  . Hyperlipidemia   . Mitral valve insufficiency and aortic valve insufficiency   . Cardiomyopathy- mixed   . 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 Chronic diastolic heart failure   . Anemia   . Coronary artery disease     Previously decreased EF; echo 113 normal LV function  . Hernia, incisional     abd  . History of colon cancer     history of colon cancer.  1986  . Arthritis   . LBBB (left bundle branch block)   . Atrial tachycardia     on amiod  . Hypertensive heart disease     sees Dr. Alain Marion  . Stroke     2011/12  . Renal failure     ESRD, Dr Dunham/Dr. Lyda Kalata.  M, W, Fr  . Obesity   . Hypertension   . Glaucoma     History   Social History  . Marital Status: Single    Spouse Name: N/A  . Number of Children: N/A  . Years of Education: N/A   Occupational History  . Not on file.   Social History Main Topics  . Smoking status: Never Smoker   . Smokeless tobacco: Never Used  . Alcohol Use: No  . Drug Use: No  . Sexual Activity: No   Other Topics Concern  . Not on file   Social  History Narrative    Past Surgical History  Procedure Laterality Date  . Colostomy  1986  . Revision urostomy cutaneous    . Esophagogastroduodenoscopy  03-18-04  . Electrocardiogram  04-27-06  . Placement of new left forearm arteriovenous graft  03-11-08  . Left heart catheterization and right heart catheterization  12-10    R. heart cath showed elevated left and right heart filling pressures w/ pulmonary artery pressure elevated mildly out of proportion to the wedge. The left heart cath showed diffuse distal vessel disease as well as a 75% stenosis in the mid circumflex w/ a 90% stenosis of the ostial first obtuse marginal. These lesions were in close proximity. there was a 60-70%mild RCA stenosis.   . Arteriovenous graft placement  2010  . 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 Cardiac catheterization    . Eye surgery      Cataract Left  . Pars plana vitrectomy  11/29/2011    Procedure: PARS PLANA VITRECTOMY WITH 23 GAUGE;  Surgeon: Adonis Brook, MD;  Location: [facility];  Service: Ophthalmology;  Laterality: Right;  Right Eye 23 ga vitrectomy with membrane peel  . Pars plana vitrectomy Left  02/28/2012    Procedure: PARS PLANA VITRECTOMY WITH 23 GAUGE;  Surgeon: Adonis Brook, MD;  Location: [facility];  Service: Ophthalmology;  Laterality: Left;  . Esophagogastroduodenoscopy N/A 02/02/2013    Procedure: ESOPHAGOGASTRODUODENOSCOPY (EGD);  Surgeon: Irene Shipper, MD;  Location: [facility];  Service: Endoscopy;  Laterality: N/A;  . Vaginal hysterectomy    . Colonoscopy    . Insertion of ahmed valve Right 08/20/2013    Procedure: INSERTION OF AHMED VALVE WITH Mitomycin C application;  Surgeon: Marylynn Pearson, MD;  Location: [facility];  Service: Ophthalmology;  Laterality: Right;    Family History  Problem Relation Age of Onset  . Cancer Sister     colon  . Hypertension Other   . Hypertension Mother   . Coronary artery disease Mother   . Hypertension Father   .  Diabetes Father     Allergies  Allergen Reactions  . Ace Inhibitors Cough  . Eggs Or Egg-Derived Products Nausea And Vomiting  . Enalapril Cough  . Lisinopril Cough  . Omnipaque [Iohexol] Hives    Current Outpatient Prescriptions on File Prior to Visit  Medication Sig Dispense Refill  . ALPHAGAN P 0.1 % SOLN Place 1 drop into both eyes 3 (three) times daily.     [name] amiodarone (PACERONE) 200 MG tablet Take 0.5 tablets (100 mg total) by mouth daily. 45 tablet 6  . aspirin 325 MG tablet Take 325 mg by mouth every 4 (four) hours as needed for mild pain.    . calcium acetate (PHOSLO) 667 MG capsule Take 1,334-2,001 mg by mouth See admin instructions. Take 2001mg  three times daily with meals and Take 1334mg   with snacks.    . dorzolamide-timolol (COSOPT) 22.3-6.8 MG/ML ophthalmic solution Place 1 drop into both eyes 2 (two) times daily.     [name] glucose blood (ONE TOUCH ULTRA TEST) test strip Check blood sugar 2 time per day. DX code E11.9 200 each 2  . isosorbide mononitrate (IMDUR) 60 MG 24 hr tablet Take 1 tablet (60 mg total) by mouth daily. Takes in afternoon 30 tablet 6  . metoprolol (LOPRESSOR) 50 MG tablet Take 50 mg by mouth 4 (four) times a week. Take 50mg  once a day on Tuesday, Thursday, Saturday, and Sunday. Skipping Dialysis days.    . repaglinide (PRANDIN) 0.5 MG tablet Take 1 tablet (0.5 mg total) by mouth 3 (three) times daily before meals. 90 tablet 11  . simvastatin (ZOCOR) 10 MG tablet Take 10 mg by mouth every evening.    . Fluticasone-Salmeterol (ADVAIR) 250-50 MCG/DOSE AEPB Inhale 1 puff into the lungs daily as needed (shortness of breath).      No current facility-administered medications on file prior to visit.    BP 108/70 mmHg  Pulse 93  Temp(Src) 98.6 F (37 C) (Oral)  Wt 206 lb 3.2 oz (93.532 kg)  SpO2 96%       Objective:   Physical Exam  Constitutional: She is oriented to person, place, and time. She appears well-developed and well-nourished. No distress.    Neck: Normal range of motion. No tracheal deviation present. No thyromegaly present.  Cardiovascular: Normal rate, regular rhythm, normal heart sounds and intact distal pulses.  Exam reveals no gallop and no friction rub.   No murmur heard. Pulmonary/Chest: Effort normal and breath sounds normal. No stridor. No respiratory distress. She has no wheezes. She has no rales.  Neurological: She is alert and oriented to person, place, and time.  Skin: Skin is warm and dry.  No rash noted. She is not diaphoretic. No erythema. No pallor.  Psychiatric: She has a normal mood and affect. Her behavior is normal. Judgment and thought content normal.  Nursing note and vitals reviewed.       Assessment & Plan:   1. Cough Continue with OTC cough medicine - Follow up if any fever or productive cough.

## 2014-06-09 NOTE — Telephone Encounter (Signed)
Patient stated that pharmacy told her that she can not get her test strips for her one touch ultra meter unless Dr Loanne Drilling fill out a CNM form, please advise

## 2014-06-09 NOTE — Patient Instructions (Signed)
Continue to use Robitussin or Delsym for your cough. Follow up if you start to develop a fever or your cough brings up mucus.

## 2014-06-10 DIAGNOSIS — N186 End stage renal disease: Secondary | ICD-10-CM | POA: Diagnosis not present

## 2014-06-10 DIAGNOSIS — D509 Iron deficiency anemia, unspecified: Secondary | ICD-10-CM | POA: Diagnosis not present

## 2014-06-10 DIAGNOSIS — N2581 Secondary hyperparathyroidism of renal origin: Secondary | ICD-10-CM | POA: Diagnosis not present

## 2014-06-10 DIAGNOSIS — D631 Anemia in chronic kidney disease: Secondary | ICD-10-CM | POA: Diagnosis not present

## 2014-06-10 DIAGNOSIS — E119 Type 2 diabetes mellitus without complications: Secondary | ICD-10-CM | POA: Diagnosis not present

## 2014-06-12 DIAGNOSIS — N2581 Secondary hyperparathyroidism of renal origin: Secondary | ICD-10-CM | POA: Diagnosis not present

## 2014-06-12 DIAGNOSIS — N186 End stage renal disease: Secondary | ICD-10-CM | POA: Diagnosis not present

## 2014-06-12 DIAGNOSIS — D631 Anemia in chronic kidney disease: Secondary | ICD-10-CM | POA: Diagnosis not present

## 2014-06-12 DIAGNOSIS — D509 Iron deficiency anemia, unspecified: Secondary | ICD-10-CM | POA: Diagnosis not present

## 2014-06-12 DIAGNOSIS — E119 Type 2 diabetes mellitus without complications: Secondary | ICD-10-CM | POA: Diagnosis not present

## 2014-06-15 DIAGNOSIS — D509 Iron deficiency anemia, unspecified: Secondary | ICD-10-CM | POA: Diagnosis not present

## 2014-06-15 DIAGNOSIS — D631 Anemia in chronic kidney disease: Secondary | ICD-10-CM | POA: Diagnosis not present

## 2014-06-15 DIAGNOSIS — N186 End stage renal disease: Secondary | ICD-10-CM | POA: Diagnosis not present

## 2014-06-15 DIAGNOSIS — N2581 Secondary hyperparathyroidism of renal origin: Secondary | ICD-10-CM | POA: Diagnosis not present

## 2014-06-15 DIAGNOSIS — E119 Type 2 diabetes mellitus without complications: Secondary | ICD-10-CM | POA: Diagnosis not present

## 2014-06-16 ENCOUNTER — Ambulatory Visit: Payer: PRIVATE HEALTH INSURANCE | Admitting: Endocrinology

## 2014-06-16 ENCOUNTER — Telehealth: Payer: Self-pay | Admitting: Endocrinology

## 2014-06-16 DIAGNOSIS — H4011X3 Primary open-angle glaucoma, severe stage: Secondary | ICD-10-CM | POA: Diagnosis not present

## 2014-06-16 DIAGNOSIS — Z0289 Encounter for other administrative examinations: Secondary | ICD-10-CM

## 2014-06-16 NOTE — Telephone Encounter (Signed)
No show letter mailed to the patient.

## 2014-06-16 NOTE — Telephone Encounter (Signed)
Patient no showed today's appt. Please advise on how to follow up. °A. No follow up necessary. °B. Follow up urgent. Contact patient immediately. °C. Follow up necessary. Contact patient and schedule visit in ___ days. °D. Follow up advised. Contact patient and schedule visit in ____weeks. ° °

## 2014-06-16 NOTE — Telephone Encounter (Signed)
Follow up advised. Contact patient and schedule visit in 3 months. 

## 2014-06-17 DIAGNOSIS — N2581 Secondary hyperparathyroidism of renal origin: Secondary | ICD-10-CM | POA: Diagnosis not present

## 2014-06-17 DIAGNOSIS — N186 End stage renal disease: Secondary | ICD-10-CM | POA: Diagnosis not present

## 2014-06-17 DIAGNOSIS — D631 Anemia in chronic kidney disease: Secondary | ICD-10-CM | POA: Diagnosis not present

## 2014-06-17 DIAGNOSIS — D509 Iron deficiency anemia, unspecified: Secondary | ICD-10-CM | POA: Diagnosis not present

## 2014-06-17 DIAGNOSIS — E119 Type 2 diabetes mellitus without complications: Secondary | ICD-10-CM | POA: Diagnosis not present

## 2014-06-18 ENCOUNTER — Telehealth: Payer: Self-pay | Admitting: Endocrinology

## 2014-06-18 NOTE — Telephone Encounter (Signed)
rx sent back on 5/24 needs to have a cnn form filled out and sent to her pharmacy she needs them pretty soon

## 2014-06-18 NOTE — Telephone Encounter (Signed)
Called pharmacy to have them fax it to [place].

## 2014-06-19 DIAGNOSIS — N2581 Secondary hyperparathyroidism of renal origin: Secondary | ICD-10-CM | POA: Diagnosis not present

## 2014-06-19 DIAGNOSIS — N186 End stage renal disease: Secondary | ICD-10-CM | POA: Diagnosis not present

## 2014-06-19 DIAGNOSIS — D631 Anemia in chronic kidney disease: Secondary | ICD-10-CM | POA: Diagnosis not present

## 2014-06-19 DIAGNOSIS — D509 Iron deficiency anemia, unspecified: Secondary | ICD-10-CM | POA: Diagnosis not present

## 2014-06-19 DIAGNOSIS — E119 Type 2 diabetes mellitus without complications: Secondary | ICD-10-CM | POA: Diagnosis not present

## 2014-06-22 DIAGNOSIS — D509 Iron deficiency anemia, unspecified: Secondary | ICD-10-CM | POA: Diagnosis not present

## 2014-06-22 DIAGNOSIS — D631 Anemia in chronic kidney disease: Secondary | ICD-10-CM | POA: Diagnosis not present

## 2014-06-22 DIAGNOSIS — E119 Type 2 diabetes mellitus without complications: Secondary | ICD-10-CM | POA: Diagnosis not present

## 2014-06-22 DIAGNOSIS — N186 End stage renal disease: Secondary | ICD-10-CM | POA: Diagnosis not present

## 2014-06-22 DIAGNOSIS — N2581 Secondary hyperparathyroidism of renal origin: Secondary | ICD-10-CM | POA: Diagnosis not present

## 2014-06-24 DIAGNOSIS — N186 End stage renal disease: Secondary | ICD-10-CM | POA: Diagnosis not present

## 2014-06-24 DIAGNOSIS — N2581 Secondary hyperparathyroidism of renal origin: Secondary | ICD-10-CM | POA: Diagnosis not present

## 2014-06-24 DIAGNOSIS — D631 Anemia in chronic kidney disease: Secondary | ICD-10-CM | POA: Diagnosis not present

## 2014-06-24 DIAGNOSIS — E119 Type 2 diabetes mellitus without complications: Secondary | ICD-10-CM | POA: Diagnosis not present

## 2014-06-24 DIAGNOSIS — D509 Iron deficiency anemia, unspecified: Secondary | ICD-10-CM | POA: Diagnosis not present

## 2014-06-26 DIAGNOSIS — D631 Anemia in chronic kidney disease: Secondary | ICD-10-CM | POA: Diagnosis not present

## 2014-06-26 DIAGNOSIS — N186 End stage renal disease: Secondary | ICD-10-CM | POA: Diagnosis not present

## 2014-06-26 DIAGNOSIS — N2581 Secondary hyperparathyroidism of renal origin: Secondary | ICD-10-CM | POA: Diagnosis not present

## 2014-06-26 DIAGNOSIS — E119 Type 2 diabetes mellitus without complications: Secondary | ICD-10-CM | POA: Diagnosis not present

## 2014-06-26 DIAGNOSIS — D509 Iron deficiency anemia, unspecified: Secondary | ICD-10-CM | POA: Diagnosis not present

## 2014-06-29 DIAGNOSIS — E119 Type 2 diabetes mellitus without complications: Secondary | ICD-10-CM | POA: Diagnosis not present

## 2014-06-29 DIAGNOSIS — D509 Iron deficiency anemia, unspecified: Secondary | ICD-10-CM | POA: Diagnosis not present

## 2014-06-29 DIAGNOSIS — D631 Anemia in chronic kidney disease: Secondary | ICD-10-CM | POA: Diagnosis not present

## 2014-06-29 DIAGNOSIS — N2581 Secondary hyperparathyroidism of renal origin: Secondary | ICD-10-CM | POA: Diagnosis not present

## 2014-06-29 DIAGNOSIS — N186 End stage renal disease: Secondary | ICD-10-CM | POA: Diagnosis not present

## 2014-07-01 DIAGNOSIS — D509 Iron deficiency anemia, unspecified: Secondary | ICD-10-CM | POA: Diagnosis not present

## 2014-07-01 DIAGNOSIS — E119 Type 2 diabetes mellitus without complications: Secondary | ICD-10-CM | POA: Diagnosis not present

## 2014-07-01 DIAGNOSIS — N186 End stage renal disease: Secondary | ICD-10-CM | POA: Diagnosis not present

## 2014-07-01 DIAGNOSIS — N2581 Secondary hyperparathyroidism of renal origin: Secondary | ICD-10-CM | POA: Diagnosis not present

## 2014-07-01 DIAGNOSIS — D631 Anemia in chronic kidney disease: Secondary | ICD-10-CM | POA: Diagnosis not present

## 2014-07-03 DIAGNOSIS — D631 Anemia in chronic kidney disease: Secondary | ICD-10-CM | POA: Diagnosis not present

## 2014-07-03 DIAGNOSIS — E119 Type 2 diabetes mellitus without complications: Secondary | ICD-10-CM | POA: Diagnosis not present

## 2014-07-03 DIAGNOSIS — N2581 Secondary hyperparathyroidism of renal origin: Secondary | ICD-10-CM | POA: Diagnosis not present

## 2014-07-03 DIAGNOSIS — N186 End stage renal disease: Secondary | ICD-10-CM | POA: Diagnosis not present

## 2014-07-03 DIAGNOSIS — D509 Iron deficiency anemia, unspecified: Secondary | ICD-10-CM | POA: Diagnosis not present

## 2014-07-06 DIAGNOSIS — D509 Iron deficiency anemia, unspecified: Secondary | ICD-10-CM | POA: Diagnosis not present

## 2014-07-06 DIAGNOSIS — E119 Type 2 diabetes mellitus without complications: Secondary | ICD-10-CM | POA: Diagnosis not present

## 2014-07-06 DIAGNOSIS — N2581 Secondary hyperparathyroidism of renal origin: Secondary | ICD-10-CM | POA: Diagnosis not present

## 2014-07-06 DIAGNOSIS — D631 Anemia in chronic kidney disease: Secondary | ICD-10-CM | POA: Diagnosis not present

## 2014-07-06 DIAGNOSIS — N186 End stage renal disease: Secondary | ICD-10-CM | POA: Diagnosis not present

## 2014-07-08 DIAGNOSIS — D509 Iron deficiency anemia, unspecified: Secondary | ICD-10-CM | POA: Diagnosis not present

## 2014-07-08 DIAGNOSIS — E119 Type 2 diabetes mellitus without complications: Secondary | ICD-10-CM | POA: Diagnosis not present

## 2014-07-08 DIAGNOSIS — N186 End stage renal disease: Secondary | ICD-10-CM | POA: Diagnosis not present

## 2014-07-08 DIAGNOSIS — D631 Anemia in chronic kidney disease: Secondary | ICD-10-CM | POA: Diagnosis not present

## 2014-07-08 DIAGNOSIS — N2581 Secondary hyperparathyroidism of renal origin: Secondary | ICD-10-CM | POA: Diagnosis not present

## 2014-07-09 DIAGNOSIS — Z992 Dependence on renal dialysis: Secondary | ICD-10-CM | POA: Diagnosis not present

## 2014-07-09 DIAGNOSIS — E1129 Type 2 diabetes mellitus with other diabetic kidney complication: Secondary | ICD-10-CM | POA: Diagnosis not present

## 2014-07-09 DIAGNOSIS — N186 End stage renal disease: Secondary | ICD-10-CM | POA: Diagnosis not present

## 2014-07-10 DIAGNOSIS — N186 End stage renal disease: Secondary | ICD-10-CM | POA: Diagnosis not present

## 2014-07-10 DIAGNOSIS — D509 Iron deficiency anemia, unspecified: Secondary | ICD-10-CM | POA: Diagnosis not present

## 2014-07-10 DIAGNOSIS — E119 Type 2 diabetes mellitus without complications: Secondary | ICD-10-CM | POA: Diagnosis not present

## 2014-07-10 DIAGNOSIS — D631 Anemia in chronic kidney disease: Secondary | ICD-10-CM | POA: Diagnosis not present

## 2014-07-10 DIAGNOSIS — N2581 Secondary hyperparathyroidism of renal origin: Secondary | ICD-10-CM | POA: Diagnosis not present

## 2014-07-13 DIAGNOSIS — D631 Anemia in chronic kidney disease: Secondary | ICD-10-CM | POA: Diagnosis not present

## 2014-07-13 DIAGNOSIS — D509 Iron deficiency anemia, unspecified: Secondary | ICD-10-CM | POA: Diagnosis not present

## 2014-07-13 DIAGNOSIS — N2581 Secondary hyperparathyroidism of renal origin: Secondary | ICD-10-CM | POA: Diagnosis not present

## 2014-07-13 DIAGNOSIS — N186 End stage renal disease: Secondary | ICD-10-CM | POA: Diagnosis not present

## 2014-07-13 DIAGNOSIS — E119 Type 2 diabetes mellitus without complications: Secondary | ICD-10-CM | POA: Diagnosis not present

## 2014-07-15 DIAGNOSIS — E119 Type 2 diabetes mellitus without complications: Secondary | ICD-10-CM | POA: Diagnosis not present

## 2014-07-15 DIAGNOSIS — N2581 Secondary hyperparathyroidism of renal origin: Secondary | ICD-10-CM | POA: Diagnosis not present

## 2014-07-15 DIAGNOSIS — D631 Anemia in chronic kidney disease: Secondary | ICD-10-CM | POA: Diagnosis not present

## 2014-07-15 DIAGNOSIS — N186 End stage renal disease: Secondary | ICD-10-CM | POA: Diagnosis not present

## 2014-07-15 DIAGNOSIS — D509 Iron deficiency anemia, unspecified: Secondary | ICD-10-CM | POA: Diagnosis not present

## 2014-07-17 ENCOUNTER — Other Ambulatory Visit: Payer: Self-pay | Admitting: Ophthalmology

## 2014-07-17 DIAGNOSIS — D631 Anemia in chronic kidney disease: Secondary | ICD-10-CM | POA: Diagnosis not present

## 2014-07-17 DIAGNOSIS — D509 Iron deficiency anemia, unspecified: Secondary | ICD-10-CM | POA: Diagnosis not present

## 2014-07-17 DIAGNOSIS — N2581 Secondary hyperparathyroidism of renal origin: Secondary | ICD-10-CM | POA: Diagnosis not present

## 2014-07-17 DIAGNOSIS — E119 Type 2 diabetes mellitus without complications: Secondary | ICD-10-CM | POA: Diagnosis not present

## 2014-07-17 DIAGNOSIS — N186 End stage renal disease: Secondary | ICD-10-CM | POA: Diagnosis not present

## 2014-07-17 MED ORDER — TETRACAINE HCL 0.5 % OP SOLN: 1.0000 [drp] | OPHTHALMIC | Status: AC

## 2014-07-17 MED ORDER — MITOMYCIN-C INJECTION USE IN OR ONLY (0.4 MG/ML): 0.5000 mL | Freq: Once | INTRAVENOUS | Status: AC

## 2014-07-17 NOTE — H&P (Signed)
History & Physical:   DATE:   06-26-14  NAME:  [name], [name]     [id number]       HISTORY OF PRESENT ILLNESS: Dialysis M/W/F     Chief Eye Complaints   Diabetic/Glaucoma: Patient c/o . Va is blurry. Patient c/o having a fbs. BS 140mg % yesterday.   HPI: EYES: Reports symptoms of foreign body sensation OD poor glaucoma controlled  OS      LOCATION:   Left  EYE        QUALITY/COURSE:   Reports condition is worsening.        INTENSITY/SEVERITY:    Reports measurement ( or degree) as moderate.      DURATION:   Reports the general length of symptoms to be months.              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   ICD10: E11.351  ICD9:   Onset: 04/14/2014  11:31    Glaucoma associated with vascular disorders   ICD10:   ICD9: 365.63  Onset: 08/07/2013 10:24  uncontrolled with progressive vision loss OS      Patient due for Dialysis tomorrow Proliferative diabetic retinopathy   ICD10:   ICD9: 362.02  Onset: 08/07/2013 09:03    Vitreous hemorrhage   ICD10:   ICD9: 379.23  Onset: 08/07/2013 09:03  OD  Neovascular glaucoma   ICD10: H40.89 ICD9: 365.9 Onset: 08/07/2013 09:04  OD  Pseudophakia   ICD10: Z96.1 ICD9: 446.0 Onset: 08/07/2013 09:03  SURGERIES: 09/19/13 Avastin OU 08/20/13 Ahmed Valve OD Avastin injection OD 08/2013 Dr Anderson Malta 2009? CE w/  intraocular lens implant  OU (Dr. Anderson Malta) 06/22/11 PANRETINAL PHOTOCOAGULATION  OS, 11/2009 PPV,MP,EL OU Pick List - Surgeries  MEDICATIONS: Alphagan P: 0.1% solution SIG-  1 DROP IN OS THREE TIMES DAILY   Lumigan: 0.01% solution SIG-  1 gtt in each affected eye once a day (in the evening) for 30 days  OS Only  Cosopt (Dorzolamide-Hydrochloride-Timolol Mal):    2.23%-0.68% solution    SIG-   1 milliliter(s)  drop     2 times a day              Humalog (Insulin  Lispro):  100 units/mL (injection)  SIG-   as directed    as directed    Durezol: 0.05% emulsion SIG-  1 gtt. OD BID  REVIEW OF SYSTEMS: ROS:   GEN- Constitutional: HENT: GEN - Endocrine: Reports symptoms of diabetes.    LUNGS/Respiratory:  HEART/Cardiovascular: Reports symptoms of hypertension.    ABD/Gastrointestinal:  Musculoskeletal (BJE): +++++      arthralgias    osteoarthritis/osteoarthrosis   NEURO/Neurological: PSYCH/Psychiatric:    Is the pt oriented to time, place, person? Yes Mood  normal     TOBACCO: Smoker Status:   Never smoker   ICD10: Z87.898 ICD9: V13.89 Onset: 08/07/2013 09:12  SOCIAL HISTORY: Starter Pick List - Social History  FAMILY HISTORY:  Family History - 1st Degree Relatives:  Daughter alive and well.    ALLERGIES: Fluorescein Containing Compounds Severity-  Onset-  Reaction-  Status- Active Type- Drug allergy Date Changed-  HIVES Starter - Allergies - Summary:  PHYSICAL EXAMINATION: VS: BP: 115/57.  P: 79 /min.  RR: 20 /min.  W:  200lbs 0oz.   VS: BMI: 30.0.  BP: 151/76.  H: 68.50 in.  P: 87 /min.  W: 200lbs 0oz.    Va     OD: cc  HM @ 5' PH 20/NI OS: cc  20/40 PH20/30  EYEGLASSES: 2015 OD: -1.00 +1.75 x169 OS: -1.50 +1.00 x011 ADD:+2.50  MR  04/14/2014 09:51  OD: -1.25 +1.75 x169 20/100 OS:  -1.75 +1.25 x011 20/30 ADD +2.75  K's OD: 45.75 46.50 OS: 45.50 46.00  VF:   OD: decrease vision in all four quadrants OS: full in all four quadrants  Motility full  PUPILS: 60mm reactive OU  EYELIDS & OCULAR ADNEXA normal    SLE: Conjunctiva: OD: incision neg seidel w/ tube in good position  OS Quiet  Cornea:   OD: injection  DZ:HGDJM &   Decreased Tear Break-Up Time OU,     anterior chamber   EQ:ASTM quiet + tube open OS: Deep quiet   Iris Brown OU  no rubeosis OS HD:[id number] rubeosis   Lens  posterior chamber  intraocular lens implant  OU   Vitreous  CCT  Ta   in mmHg    OD 16       OS 29,30    Time  06/16/2014  11:48   Gonio OD angle open 360 degrees with Rubeosis throughout TM  OS angle open 360 degrees to  scleral spur, TM +2 pigment  with temporal vessels in angle   Dilation:No dilation EIOP07/30/2015 11:21   Fundus:  optic nerve   OD: pale temporal 55% cupping  Poor view                                                 OS: temporal pallor rim intact   Macula       OD                                                     OS poor view  Vessels narrow OU  Periphery:  PANRETINAL PHOTOCOAGULATION Scars OU   Humphery visual field  OD double bjerrum scatoma extends into fixation with small island of vision  OS double nasal step, double arcuate defects      Exam: GENERAL: Appearance: HEAD, EARS, NOSE AND THROAT: Ears-Nose (external) Inspection: Externally, nose and ears are normal in appearance and without scars, lesions, or nodules.      Hearing assessment shows no problems with normal conversation.      LUNGS and RESPIRATORY: Lung auscultation elicits no wheezing, rhonci, rales or rubs and with equal breath sounds.    Respiratory effort described as breathing is unlabored and chest movement is symmetrical.    HEART (Cardiovascular): Heart auscultation discovers regular rate and rhythm; no murmur, gallop or rub. Normal heart sounds.    ABDOMEN (Gastrointestinal): Mass/Tenderness Exam: Neither are present.     MUSCULOSKELETAL (BJE): Inspection-Palpation: No major bone, joint, tendon, or muscle changes.      NEUROLOGICAL: Alert and oriented. No major deficits of coordination or sensation.      PSYCHIATRIC: Insight and judgment appear  both to be intact and appropriate.    Mood and affect are described as normal mood and full affect.  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   ICD10:  E11.351      Glaucoma associated with vascular disorders   ICD10:   ICD9: 365.63  Onset: 08/07/2013 10:24  Initial Date:   uncontrolled with progressive vision loss OS      Patient due for Dialysis tomorrow Proliferative diabetic retinopathy   ICD10:   ICD9: 362.02  Onset: 08/07/2013 09:03    Vitreous hemorrhage   ICD10:   ICD9: 379.23  Onset: 08/07/2013 09:03  OD  Neovascular glaucoma   ICD10: H40.89 ICD9: 365.9 Onset: 08/07/2013 09:04  OD  Pseudophakia   ICD10: Z96.1 ICD9: 446.0 Onset: 08/07/2013 09:03  SURGICAL TREATMENT PLAN: Ahmed Valve w [facility]  OS   Meds  not effective IOP > 20  patient  will need tube shunt surgery OS    Risk and benefits of surgery have been reviewed with the patient and the patient agrees to proceed with the surgical procedure.   .    ___________________________ Marylynn Pearson, Jr. Colon cancer   ICD10:  C18.9  ICD9: 153.9  Onset: 08/07/2013 09:28  Resolved: 08/07/2013  09:28  1986   Starter - Inactive Problems:

## 2014-07-20 DIAGNOSIS — E119 Type 2 diabetes mellitus without complications: Secondary | ICD-10-CM | POA: Diagnosis not present

## 2014-07-20 DIAGNOSIS — D509 Iron deficiency anemia, unspecified: Secondary | ICD-10-CM | POA: Diagnosis not present

## 2014-07-20 DIAGNOSIS — D631 Anemia in chronic kidney disease: Secondary | ICD-10-CM | POA: Diagnosis not present

## 2014-07-20 DIAGNOSIS — N186 End stage renal disease: Secondary | ICD-10-CM | POA: Diagnosis not present

## 2014-07-20 DIAGNOSIS — N2581 Secondary hyperparathyroidism of renal origin: Secondary | ICD-10-CM | POA: Diagnosis not present

## 2014-07-21 ENCOUNTER — Encounter (HOSPITAL_COMMUNITY): Payer: Self-pay | Admitting: *Deleted

## 2014-07-21 MED ORDER — MUPIROCIN 2 % EX OINT
1.0000 "application " | TOPICAL_OINTMENT | Freq: Once | CUTANEOUS | Status: AC
  Administered 2014-07-22: 1 via TOPICAL
  Filled 2014-07-21: qty 22

## 2014-07-22 ENCOUNTER — Encounter (HOSPITAL_COMMUNITY)
Admission: RE | Disposition: A | Discharge status: Home / Self Care | Payer: Self-pay | Source: Ambulatory Visit | Attending: Ophthalmology

## 2014-07-22 ENCOUNTER — Ambulatory Visit (HOSPITAL_COMMUNITY)
Admission: RE | Admit: 2014-07-22 | Discharge: 2014-07-22 | Disposition: A | Discharge status: Home / Self Care | Payer: Medicare Other | Source: Ambulatory Visit | Attending: Ophthalmology | Admitting: Ophthalmology

## 2014-07-22 ENCOUNTER — Ambulatory Visit (HOSPITAL_COMMUNITY): Payer: Medicare Other | Admitting: Anesthesiology

## 2014-07-22 ENCOUNTER — Encounter (HOSPITAL_COMMUNITY): Payer: Self-pay | Admitting: Anesthesiology

## 2014-07-22 DIAGNOSIS — D649 Anemia, unspecified: Secondary | ICD-10-CM | POA: Diagnosis not present

## 2014-07-22 DIAGNOSIS — H4010X3 Unspecified open-angle glaucoma, severe stage: Secondary | ICD-10-CM | POA: Diagnosis not present

## 2014-07-22 DIAGNOSIS — I120 Hypertensive chronic kidney disease with stage 5 chronic kidney disease or end stage renal disease: Secondary | ICD-10-CM | POA: Insufficient documentation

## 2014-07-22 DIAGNOSIS — H4011X0 Primary open-angle glaucoma, stage unspecified: Secondary | ICD-10-CM | POA: Diagnosis present

## 2014-07-22 DIAGNOSIS — I252 Old myocardial infarction: Secondary | ICD-10-CM | POA: Insufficient documentation

## 2014-07-22 DIAGNOSIS — N186 End stage renal disease: Secondary | ICD-10-CM | POA: Diagnosis not present

## 2014-07-22 DIAGNOSIS — E119 Type 2 diabetes mellitus without complications: Secondary | ICD-10-CM | POA: Diagnosis not present

## 2014-07-22 DIAGNOSIS — H4011X3 Primary open-angle glaucoma, severe stage: Secondary | ICD-10-CM | POA: Diagnosis not present

## 2014-07-22 DIAGNOSIS — Z992 Dependence on renal dialysis: Secondary | ICD-10-CM | POA: Insufficient documentation

## 2014-07-22 DIAGNOSIS — I2510 Atherosclerotic heart disease of native coronary artery without angina pectoris: Secondary | ICD-10-CM | POA: Diagnosis not present

## 2014-07-22 DIAGNOSIS — K219 Gastro-esophageal reflux disease without esophagitis: Secondary | ICD-10-CM | POA: Diagnosis not present

## 2014-07-22 HISTORY — PX: MITOMYCIN C APPLICATION: SHX6375

## 2014-07-22 HISTORY — DX: Colostomy status: Z93.3

## 2014-07-22 HISTORY — PX: INSERTION OF AHMED VALVE: SHX6254

## 2014-07-22 HISTORY — DX: Gastro-esophageal reflux disease without esophagitis: K21.9

## 2014-07-22 LAB — POCT I-STAT 4, (NA,K, GLUC, HGB,HCT)
GLUCOSE: 99 mg/dL (ref 65–99)
HCT: 33 % — ABNORMAL LOW (ref 36.0–46.0)
HEMOGLOBIN: 11.2 g/dL — AB (ref 12.0–15.0)
Potassium: 3.9 mmol/L (ref 3.5–5.1)
SODIUM: 139 mmol/L (ref 135–145)

## 2014-07-22 LAB — GLUCOSE, CAPILLARY: GLUCOSE-CAPILLARY: 95 mg/dL (ref 65–99)

## 2014-07-22 LAB — SURGICAL PCR SCREEN
MRSA, PCR: POSITIVE — AB
STAPHYLOCOCCUS AUREUS: POSITIVE — AB

## 2014-07-22 SURGERY — INSERTION, GLAUCOMA VALVE, AHMED
Anesthesia: Monitor Anesthesia Care | Site: Eye | Laterality: Left

## 2014-07-22 MED ORDER — SODIUM HYALURONATE 10 MG/ML IO SOLN
INTRAOCULAR | Status: DC | PRN
  Administered 2014-07-22: 0.85 mL via INTRAOCULAR

## 2014-07-22 MED ORDER — LIDOCAINE HCL (CARDIAC) 20 MG/ML IV SOLN
INTRAVENOUS | Status: DC | PRN
  Administered 2014-07-22 (×2): 20 mg via INTRAVENOUS

## 2014-07-22 MED ORDER — MUPIROCIN 2 % EX OINT
TOPICAL_OINTMENT | CUTANEOUS | Status: AC
  Administered 2014-07-22: 1 via TOPICAL
  Filled 2014-07-22: qty 22

## 2014-07-22 MED ORDER — LIDOCAINE-EPINEPHRINE 2 %-1:100000 IJ SOLN
INTRAMUSCULAR | Status: DC | PRN
  Administered 2014-07-22: 5 mL via RETROBULBAR

## 2014-07-22 MED ORDER — HYALURONIDASE HUMAN 150 UNIT/ML IJ SOLN
INTRAMUSCULAR | Status: AC
  Filled 2014-07-22: qty 1

## 2014-07-22 MED ORDER — MIDAZOLAM HCL 2 MG/2ML IJ SOLN
INTRAMUSCULAR | Status: AC
  Filled 2014-07-22: qty 2

## 2014-07-22 MED ORDER — SUCCINYLCHOLINE CHLORIDE 20 MG/ML IJ SOLN
INTRAMUSCULAR | Status: AC
  Filled 2014-07-22: qty 1

## 2014-07-22 MED ORDER — GATIFLOXACIN 0.5 % OP SOLN
OPHTHALMIC | Status: AC
  Administered 2014-07-22: 1 [drp] via OPHTHALMIC
  Filled 2014-07-22: qty 2.5

## 2014-07-22 MED ORDER — LIDOCAINE HCL (CARDIAC) 20 MG/ML IV SOLN
INTRAVENOUS | Status: AC
  Filled 2014-07-22: qty 5

## 2014-07-22 MED ORDER — SODIUM CHLORIDE 0.9 % IJ SOLN
INTRAMUSCULAR | Status: AC
  Filled 2014-07-22: qty 10

## 2014-07-22 MED ORDER — SODIUM HYALURONATE 10 MG/ML IO SOLN
INTRAOCULAR | Status: AC
  Filled 2014-07-22: qty 0.85

## 2014-07-22 MED ORDER — ATROPINE SULFATE 1 % OP SOLN
OPHTHALMIC | Status: AC
  Filled 2014-07-22: qty 5

## 2014-07-22 MED ORDER — ONDANSETRON HCL 4 MG/2ML IJ SOLN
INTRAMUSCULAR | Status: AC
  Filled 2014-07-22: qty 2

## 2014-07-22 MED ORDER — MITOMYCIN 0.2 MG OP KIT
0.2000 mg | PACK | Freq: Once | OPHTHALMIC | Status: DC
  Filled 2014-07-22: qty 1

## 2014-07-22 MED ORDER — ACETYLCHOLINE CHLORIDE 20 MG IO SOLR
INTRAOCULAR | Status: AC
  Filled 2014-07-22: qty 1

## 2014-07-22 MED ORDER — TETRACAINE HCL 0.5 % OP SOLN
OPHTHALMIC | Status: AC
  Filled 2014-07-22: qty 2

## 2014-07-22 MED ORDER — TOBRAMYCIN 0.3 % OP OINT
TOPICAL_OINTMENT | OPHTHALMIC | Status: DC | PRN
  Administered 2014-07-22: 1 via OPHTHALMIC

## 2014-07-22 MED ORDER — BSS IO SOLN
INTRAOCULAR | Status: AC
  Filled 2014-07-22: qty 500

## 2014-07-22 MED ORDER — EPINEPHRINE HCL 1 MG/ML IJ SOLN
INTRAMUSCULAR | Status: AC
  Filled 2014-07-22: qty 1

## 2014-07-22 MED ORDER — FLUORESCEIN SODIUM 1 MG OP STRP
ORAL_STRIP | OPHTHALMIC | Status: AC
  Filled 2014-07-22: qty 1

## 2014-07-22 MED ORDER — BSS IO SOLN
INTRAOCULAR | Status: AC
  Filled 2014-07-22: qty 15

## 2014-07-22 MED ORDER — LIDOCAINE HCL 2 % IJ SOLN
INTRAMUSCULAR | Status: AC
  Filled 2014-07-22: qty 20

## 2014-07-22 MED ORDER — TRIAMCINOLONE ACETONIDE 40 MG/ML IJ SUSP
INTRAMUSCULAR | Status: DC | PRN
  Administered 2014-07-22: .1 mL

## 2014-07-22 MED ORDER — ROCURONIUM BROMIDE 50 MG/5ML IV SOLN
INTRAVENOUS | Status: AC
  Filled 2014-07-22: qty 1

## 2014-07-22 MED ORDER — EPHEDRINE SULFATE 50 MG/ML IJ SOLN
INTRAMUSCULAR | Status: AC
  Filled 2014-07-22: qty 1

## 2014-07-22 MED ORDER — FENTANYL CITRATE (PF) 250 MCG/5ML IJ SOLN
INTRAMUSCULAR | Status: AC
  Filled 2014-07-22: qty 5

## 2014-07-22 MED ORDER — PREDNISOLONE ACETATE 1 % OP SUSP
OPHTHALMIC | Status: AC
  Administered 2014-07-22: 1 [drp] via OPHTHALMIC
  Filled 2014-07-22: qty 5

## 2014-07-22 MED ORDER — SODIUM CHLORIDE 0.9 % IV SOLN
INTRAVENOUS | Status: DC
  Administered 2014-07-22 (×2): via INTRAVENOUS

## 2014-07-22 MED ORDER — GATIFLOXACIN 0.5 % OP SOLN
1.0000 [drp] | OPHTHALMIC | Status: DC | PRN
  Administered 2014-07-22 (×2): 1 [drp] via OPHTHALMIC

## 2014-07-22 MED ORDER — TOBRAMYCIN-DEXAMETHASONE 0.3-0.1 % OP OINT
TOPICAL_OINTMENT | OPHTHALMIC | Status: AC
  Filled 2014-07-22: qty 3.5

## 2014-07-22 MED ORDER — FENTANYL CITRATE (PF) 100 MCG/2ML IJ SOLN
INTRAMUSCULAR | Status: DC | PRN
  Administered 2014-07-22: 50 ug via INTRAVENOUS

## 2014-07-22 MED ORDER — MIDAZOLAM HCL 5 MG/5ML IJ SOLN
INTRAMUSCULAR | Status: DC | PRN
  Administered 2014-07-22: 1 mg via INTRAVENOUS

## 2014-07-22 MED ORDER — BUPIVACAINE HCL (PF) 0.75 % IJ SOLN
INTRAMUSCULAR | Status: AC
  Filled 2014-07-22: qty 10

## 2014-07-22 MED ORDER — TRIAMCINOLONE ACETONIDE 40 MG/ML IJ SUSP
INTRAMUSCULAR | Status: AC
  Filled 2014-07-22: qty 5

## 2014-07-22 MED ORDER — MITOMYCIN 0.2 MG OP KIT
PACK | OPHTHALMIC | Status: DC | PRN
  Administered 2014-07-22: .4 mg via OPHTHALMIC

## 2014-07-22 MED ORDER — PREDNISOLONE ACETATE 1 % OP SUSP
1.0000 [drp] | OPHTHALMIC | Status: AC
  Administered 2014-07-22 (×2): 1 [drp] via OPHTHALMIC

## 2014-07-22 MED ORDER — BSS IO SOLN
INTRAOCULAR | Status: DC | PRN
  Administered 2014-07-22: 15 mL via INTRAOCULAR
  Administered 2014-07-22: 500 mL via INTRAOCULAR

## 2014-07-22 MED ORDER — PROPOFOL 10 MG/ML IV BOLUS
INTRAVENOUS | Status: AC
  Filled 2014-07-22: qty 20

## 2014-07-22 MED ORDER — GLYCOPYRROLATE 0.2 MG/ML IJ SOLN
INTRAMUSCULAR | Status: AC
  Filled 2014-07-22: qty 1

## 2014-07-22 MED ORDER — PROPOFOL 10 MG/ML IV BOLUS
INTRAVENOUS | Status: DC | PRN
  Administered 2014-07-22: 20 mg via INTRAVENOUS
  Administered 2014-07-22: 10 mg via INTRAVENOUS

## 2014-07-22 MED ORDER — LIDOCAINE-EPINEPHRINE 2 %-1:100000 IJ SOLN
INTRAMUSCULAR | Status: AC
  Filled 2014-07-22: qty 1

## 2014-07-22 MED ORDER — FLUORESCEIN SODIUM 1 MG OP STRP
ORAL_STRIP | OPHTHALMIC | Status: DC | PRN
  Administered 2014-07-22: 1 via OPHTHALMIC

## 2014-07-22 SURGICAL SUPPLY — 43 items
ALLOGRAFT TUTOPLAST 1.5X1.5 (Ophthalmic Related) ×1 IMPLANT
APL SRG 3 HI ABS STRL LF PLS (MISCELLANEOUS) ×1
APPLICATOR COTTON TIP 6IN STRL (MISCELLANEOUS) ×2 IMPLANT
APPLICATOR DR MATTHEWS STRL (MISCELLANEOUS) ×2 IMPLANT
BLADE EYE CATARACT 19 1.4 BEAV (BLADE) ×2 IMPLANT
BLADE STAB KNIFE 45DEG (BLADE) IMPLANT
BLADE SURG 15 STRL LF DISP TIS (BLADE) IMPLANT
BLADE SURG 15 STRL SS (BLADE)
CANISTER SUCTION 2500CC (MISCELLANEOUS) ×2 IMPLANT
CORDS BIPOLAR (ELECTRODE) ×2 IMPLANT
DRAPE OPHTHALMIC 40X48 W POUCH (DRAPES) ×2 IMPLANT
DRAPE RETRACTOR (MISCELLANEOUS) ×2 IMPLANT
ERASER HMR WETFIELD 23G BP (MISCELLANEOUS) IMPLANT
GLOVE BIO SURGEON STRL SZ8 (GLOVE) ×2 IMPLANT
GOWN STRL REIN XL XLG (GOWN DISPOSABLE) ×6 IMPLANT
KIT BASIN OR (CUSTOM PROCEDURE TRAY) ×2 IMPLANT
KIT ROOM TURNOVER OR (KITS) ×2 IMPLANT
KNIFE GRIESHABER SHARP 2.5MM (MISCELLANEOUS) ×2 IMPLANT
MARKER SKIN DUAL TIP RULER LAB (MISCELLANEOUS) ×2 IMPLANT
NEEDLE 22X1 1/2 (OR ONLY) (NEEDLE) ×2 IMPLANT
NEEDLE 25GX 5/8IN NON SAFETY (NEEDLE) ×2 IMPLANT
NEEDLE HYPO 30X.5 LL (NEEDLE) ×2 IMPLANT
NS IRRIG 1000ML POUR BTL (IV SOLUTION) ×2 IMPLANT
PACK CATARACT CUSTOM (CUSTOM PROCEDURE TRAY) ×2 IMPLANT
PAD ARMBOARD 7.5X6 YLW CONV (MISCELLANEOUS) ×4 IMPLANT
SPEAR EYE SURG WECK-CEL (MISCELLANEOUS) ×2 IMPLANT
SPECIMEN JAR SMALL (MISCELLANEOUS) IMPLANT
SUT ETHILON 10 0 CS140 6 (SUTURE) IMPLANT
SUT ETHILON 9 0 BV100 4 (SUTURE) ×2 IMPLANT
SUT ETHILON 9 0 TG140 8 (SUTURE) ×2 IMPLANT
SUT MERSILENE 6 0 S14 DA (SUTURE) IMPLANT
SUT SILK 6 0 G 6 (SUTURE) ×2 IMPLANT
SUT VICRYL 8 0 TG140 8 (SUTURE) IMPLANT
SUT VICRYL 9-0 (SUTURE) ×2 IMPLANT
SYR 20CC LL (SYRINGE) ×4 IMPLANT
SYR 50ML SLIP (SYRINGE) ×2 IMPLANT
SYR TB 1ML LUER SLIP (SYRINGE) IMPLANT
TOWEL OR 17X24 6PK STRL BLUE (TOWEL DISPOSABLE) ×2 IMPLANT
TUBE CONNECTING 12X1/4 (SUCTIONS) ×2 IMPLANT
TUTOPLAST 1.5X1.5 (Ophthalmic Related) ×2 IMPLANT
VALVE GLAUCOMA AHMED (Prosthesis & Implant Heart) ×2 IMPLANT
WATER STERILE IRR 1000ML POUR (IV SOLUTION) ×2 IMPLANT
WIPE INSTRUMENT VISIWIPE 73X73 (MISCELLANEOUS) ×2 IMPLANT

## 2014-07-22 NOTE — Discharge Instructions (Signed)
The patient may remove the eye patch today at 2:00 PM. The patient should sleep on her back or right side of avoid rubbing the eye use eyeglasses over the eye while sleeping she may want to use the eye shield over her eye tonight. Avoid heavy lifting bending or straining.

## 2014-07-22 NOTE — H&P (View-Only) (Signed)
History & Physical:   DATE:   06-26-14  NAME:  [name], [name]     [id number]       HISTORY OF PRESENT ILLNESS: Dialysis M/W/F     Chief Eye Complaints   Diabetic/Glaucoma: Patient c/o . Va is blurry. Patient c/o having a fbs. BS 140mg % yesterday.   HPI: EYES: Reports symptoms of foreign body sensation OD poor glaucoma controlled  OS      LOCATION:   Left  EYE        QUALITY/COURSE:   Reports condition is worsening.        INTENSITY/SEVERITY:    Reports measurement ( or degree) as moderate.      DURATION:   Reports the general length of symptoms to be months.              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   ICD10: E11.351  ICD9:   Onset: 04/14/2014  11:31    Glaucoma associated with vascular disorders   ICD10:   ICD9: 365.63  Onset: 08/07/2013 10:24  uncontrolled with progressive vision loss OS      Patient due for Dialysis tomorrow Proliferative diabetic retinopathy   ICD10:   ICD9: 362.02  Onset: 08/07/2013 09:03    Vitreous hemorrhage   ICD10:   ICD9: 379.23  Onset: 08/07/2013 09:03  OD  Neovascular glaucoma   ICD10: H40.89 ICD9: 365.9 Onset: 08/07/2013 09:04  OD  Pseudophakia   ICD10: Z96.1 ICD9: 446.0 Onset: 08/07/2013 09:03  SURGERIES: 09/19/13 Avastin OU 08/20/13 Ahmed Valve OD Avastin injection OD 08/2013 Dr Anderson Malta 2009? CE w/  intraocular lens implant  OU (Dr. Anderson Malta) 06/22/11 PANRETINAL PHOTOCOAGULATION  OS, 11/2009 PPV,MP,EL OU Pick List - Surgeries  MEDICATIONS: Alphagan P: 0.1% solution SIG-  1 DROP IN OS THREE TIMES DAILY   Lumigan: 0.01% solution SIG-  1 gtt in each affected eye once a day (in the evening) for 30 days  OS Only  Cosopt (Dorzolamide-Hydrochloride-Timolol Mal):    2.23%-0.68% solution    SIG-   1 milliliter(s)  drop     2 times a day              Humalog (Insulin  Lispro):  100 units/mL (injection)  SIG-   as directed    as directed    Durezol: 0.05% emulsion SIG-  1 gtt. OD BID  REVIEW OF SYSTEMS: ROS:   GEN- Constitutional: HENT: GEN - Endocrine: Reports symptoms of diabetes.    LUNGS/Respiratory:  HEART/Cardiovascular: Reports symptoms of hypertension.    ABD/Gastrointestinal:  Musculoskeletal (BJE): +++++      arthralgias    osteoarthritis/osteoarthrosis   NEURO/Neurological: PSYCH/Psychiatric:    Is the pt oriented to time, place, person? Yes Mood  normal     TOBACCO: Smoker Status:   Never smoker   ICD10: Z87.898 ICD9: V13.89 Onset: 08/07/2013 09:12  SOCIAL HISTORY: Starter Pick List - Social History  FAMILY HISTORY:  Family History - 1st Degree Relatives:  Daughter alive and well.    ALLERGIES: Fluorescein Containing Compounds Severity-  Onset-  Reaction-  Status- Active Type- Drug allergy Date Changed-  HIVES Starter - Allergies - Summary:  PHYSICAL EXAMINATION: VS: BP: 115/57.  P: 79 /min.  RR: 20 /min.  W:  200lbs 0oz.   VS: BMI: 30.0.  BP: 151/76.  H: 68.50 in.  P: 87 /min.  W: 200lbs 0oz.    Va     OD: cc  HM @ 5' PH 20/NI OS: cc  20/40 PH20/30  EYEGLASSES: 2015 OD: -1.00 +1.75 x169 OS: -1.50 +1.00 x011 ADD:+2.50  MR  04/14/2014 09:51  OD: -1.25 +1.75 x169 20/100 OS:  -1.75 +1.25 x011 20/30 ADD +2.75  K's OD: 45.75 46.50 OS: 45.50 46.00  VF:   OD: decrease vision in all four quadrants OS: full in all four quadrants  Motility full  PUPILS: 32mm reactive OU  EYELIDS & OCULAR ADNEXA normal    SLE: Conjunctiva: OD: incision neg seidel w/ tube in good position  OS Quiet  Cornea:   OD: injection  NW:GNFAO &   Decreased Tear Break-Up Time OU,     anterior chamber   ZH:YQMV quiet + tube open OS: Deep quiet   Iris Brown OU  no rubeosis OS HQ:IONGEXBM rubeosis   Lens  posterior chamber  intraocular lens implant  OU   Vitreous  CCT  Ta   in mmHg    OD 16       OS 29,30    Time  06/16/2014  11:48   Gonio OD angle open 360 degrees with Rubeosis throughout TM  OS angle open 360 degrees to  scleral spur, TM +2 pigment  with temporal vessels in angle   Dilation:No dilation EIOP07/30/2015 11:21   Fundus:  optic nerve   OD: pale temporal 55% cupping  Poor view                                                 OS: temporal pallor rim intact   Macula       OD                                                     OS poor view  Vessels narrow OU  Periphery:  PANRETINAL PHOTOCOAGULATION Scars OU   Humphery visual field  OD double bjerrum scatoma extends into fixation with small island of vision  OS double nasal step, double arcuate defects      Exam: GENERAL: Appearance: HEAD, EARS, NOSE AND THROAT: Ears-Nose (external) Inspection: Externally, nose and ears are normal in appearance and without scars, lesions, or nodules.      Hearing assessment shows no problems with normal conversation.      LUNGS and RESPIRATORY: Lung auscultation elicits no wheezing, rhonci, rales or rubs and with equal breath sounds.    Respiratory effort described as breathing is unlabored and chest movement is symmetrical.    HEART (Cardiovascular): Heart auscultation discovers regular rate and rhythm; no murmur, gallop or rub. Normal heart sounds.    ABDOMEN (Gastrointestinal): Mass/Tenderness Exam: Neither are present.     MUSCULOSKELETAL (BJE): Inspection-Palpation: No major bone, joint, tendon, or muscle changes.      NEUROLOGICAL: Alert and oriented. No major deficits of coordination or sensation.      PSYCHIATRIC: Insight and judgment appear  both to be intact and appropriate.    Mood and affect are described as normal mood and full affect.  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   ICD10:  E11.351      Glaucoma associated with vascular disorders   ICD10:   ICD9: 365.63  Onset: 08/07/2013 10:24  Initial Date:   uncontrolled with progressive vision loss OS      Patient due for Dialysis tomorrow Proliferative diabetic retinopathy   ICD10:   ICD9: 362.02  Onset: 08/07/2013 09:03    Vitreous hemorrhage   ICD10:   ICD9: 379.23  Onset: 08/07/2013 09:03  OD  Neovascular glaucoma   ICD10: H40.89 ICD9: 365.9 Onset: 08/07/2013 09:04  OD  Pseudophakia   ICD10: Z96.1 ICD9: 446.0 Onset: 08/07/2013 09:03  SURGICAL TREATMENT PLAN: Ahmed Valve w [facility]  OS   Meds  not effective IOP > 20  patient  will need tube shunt surgery OS    Risk and benefits of surgery have been reviewed with the patient and the patient agrees to proceed with the surgical procedure.   .    ___________________________ Marylynn Pearson, Jr. Colon cancer   ICD10:  C18.9  ICD9: 153.9  Onset: 08/07/2013 09:28  Resolved: 08/07/2013  09:28  1986   Starter - Inactive Problems:

## 2014-07-22 NOTE — Transfer of Care (Signed)
Immediate Anesthesia Transfer of Care Note  Patient: [name]  Procedure(s) Performed: Procedure(s): INSERTION OF AHMED VALVE WITH Remy (Left) MITOMYCIN C APPLICATION (Left)  Patient Location: PACU  Anesthesia Type:MAC  Level of Consciousness: awake, alert , oriented and sedated  Airway & Oxygen Therapy: Patient Spontanous Breathing  Post-op Assessment: Report given to RN, Post -op Vital signs reviewed and stable and Patient moving all extremities  Post vital signs: Reviewed and stable  Last Vitals:  Filed Vitals:   07/22/14 0728  BP: 112/45  Pulse: 81  Temp: 36.4 C  Resp: 20    Complications: No apparent anesthesia complications

## 2014-07-22 NOTE — Anesthesia Preprocedure Evaluation (Addendum)
Anesthesia Evaluation  Patient identified by MRN, date of birth, ID band Patient awake    Reviewed: Allergy & Precautions, NPO status   Airway Mallampati: I  TM Distance: >3 FB Neck ROM: Full    Dental   Pulmonary    Pulmonary exam normal       Cardiovascular hypertension, Pt. on medications + Past MI Normal cardiovascular exam    Neuro/Psych    GI/Hepatic GERD-  Controlled and Medicated,  Endo/Other  diabetes, Type 2, Oral Hypoglycemic Agents  Renal/GU Dialysis and ESRFRenal disease     Musculoskeletal   Abdominal   Peds  Hematology   Anesthesia Other Findings   Reproductive/Obstetrics                            Anesthesia Physical Anesthesia Plan  ASA: III  Anesthesia Plan: MAC   Post-op Pain Management:    Induction: Intravenous  Airway Management Planned: Natural Airway  Additional Equipment:   Intra-op Plan:   Post-operative Plan:   Informed Consent: I have reviewed the patients History and Physical, chart, labs and discussed the procedure including the risks, benefits and alternatives for the proposed anesthesia with the patient or authorized representative who has indicated his/her understanding and acceptance.     Plan Discussed with: CRNA and Surgeon  Anesthesia Plan Comments:         Anesthesia Quick Evaluation

## 2014-07-22 NOTE — Interval H&P Note (Signed)
History and Physical Interval Note:  07/22/2014 8:26 AM  [name]  has presented today for surgery, with the diagnosis of PRIMARY OPEN ANGLE GLAUCOMA, SEVERE STAGE  The various methods of treatment have been discussed with the patient and family. After consideration of risks, benefits and other options for treatment, the patient has consented to  Procedure(s): INSERTION OF AHMED VALVE WITH [place] (Left) MITOMYCIN C APPLICATION (Left) as a surgical intervention .  The patient's history has been reviewed, patient examined, no change in status, stable for surgery.  I have reviewed the patient's chart and labs.  Questions were answered to the patient's satisfaction.     [name]

## 2014-07-22 NOTE — Anesthesia Postprocedure Evaluation (Signed)
  Anesthesia Post-op Note  Patient: [name]  Procedure(s) Performed: Procedure(s): INSERTION OF AHMED VALVE WITH [place] (Left) MITOMYCIN C APPLICATION (Left)  Patient Location: PACU  Anesthesia Type: MAC   Level of Consciousness: awake, alert  and oriented  Airway and Oxygen Therapy: Patient Spontanous Breathing  Post-op Pain: mild  Post-op Assessment: Post-op Vital signs reviewed  Post-op Vital Signs: Reviewed  Last Vitals:  Filed Vitals:   07/22/14 1115  BP: 111/51  Pulse: 80  Temp:   Resp: 19    Complications: No apparent anesthesia complications

## 2014-07-22 NOTE — Op Note (Signed)
Preoperative diagnosis: Uncontrolled primary open-angle glaucoma with pre-existing scarring from previous surgery Postop diagnosis: Same Procedure: Insertion of Ahmed glaucoma valve using mitomycin-C and Tutoplast graft Anesthesia: 2% Xylocaine with epinephrine in a 50-50 mixture 0.75% Marcaine with ample W[place]ase Combinations: None Assistant: Unknown Foley Procedure: The patient is transported to the operating room where she was given a peribulbar block with the aforementioned local and aesthetic agent following this the patient's face is prepped and draped in the usual sterile fashion. With the surgeon sitting superiorly and the operating microscope in position a muscle hook was used to infraducted the eye and a 6-0 nylon suture was passed through clear cornea to fixate the globe. Using the [facility] forceps and a shot [place] scissor an incision was made in the superior temporal quadrant at the limbus then using the blunt [place]ott scissors dissection was carried out posteriorly to expose 8 mm posterior to the corneal scleral limbus the [place] blade was used to recess T9 fibers and bleeding was controlled with cautery. Following this mitomycin-C 0.4 mg/mL was placed onto Gelfoam sponges and passed beneath the conjunctiva and allowed to stay on the eye for 4 minutes the sponges were then removed and the eye was irrigated with 40 mL of balanced salt solution. Following this the Ahmed valve model F P7 was taken from the package and noted to have no defects it was primed using balanced salt solution and fluid was noted to flow with mild pressure. The Ahmed plate was then placed beneath the superior temporal conjunctiva using a Hoskins forceps with the eyelets of the Ahmed plate being placed 8 mm from the corneal scleral limbus a 4-0 [place] suture on a spatula needle were used to secure the eyelets to the sclera. Following this a paracentesis tract was created through clear cornea using a Wheeler blade at the 30 position  and Provisc was injected in the anterior chamber the superior limbus was then chosen to use a 23-gauge needle attached to the Provisc to enter the anterior chamber and withdrawn following this the tube which had been cut bevel up was then placed in the anterior chamber using the tube inserter the tube was then sutured to the sclera using a 9-0 nylon suture on a spatula needle. The Tutoplast graft was then fashioned to cover the tube the Tutoplast graft was cut in half and irrigated the Tutoplast was reference [phone] Tutoplast was sutured to the sclera using 2 interrupted 9-0 nylon sutures on a spatula needle. The conjunctiva was then closed with a 90 Vicryls suture on a BV 100 needle following this BSS was injected in the anterior chamber to express the Provisc from the chamber the incision was stained with floor seen and there was no leakage. Therefore subconjunctival injection of Kenalog 4 mg was given in the inferior nasal subconjunctival space. Topical Tobradex ointment was applied to the eye a patch and Fox U were placed and the patient returned to recovery area in stable condition. Marylynn Pearson Junior M.D.

## 2014-07-23 ENCOUNTER — Encounter (HOSPITAL_COMMUNITY): Payer: Self-pay | Admitting: Ophthalmology

## 2014-07-23 DIAGNOSIS — D509 Iron deficiency anemia, unspecified: Secondary | ICD-10-CM | POA: Diagnosis not present

## 2014-07-23 DIAGNOSIS — D631 Anemia in chronic kidney disease: Secondary | ICD-10-CM | POA: Diagnosis not present

## 2014-07-23 DIAGNOSIS — N186 End stage renal disease: Secondary | ICD-10-CM | POA: Diagnosis not present

## 2014-07-23 DIAGNOSIS — N2581 Secondary hyperparathyroidism of renal origin: Secondary | ICD-10-CM | POA: Diagnosis not present

## 2014-07-23 DIAGNOSIS — E119 Type 2 diabetes mellitus without complications: Secondary | ICD-10-CM | POA: Diagnosis not present

## 2014-07-24 DIAGNOSIS — D509 Iron deficiency anemia, unspecified: Secondary | ICD-10-CM | POA: Diagnosis not present

## 2014-07-24 DIAGNOSIS — N186 End stage renal disease: Secondary | ICD-10-CM | POA: Diagnosis not present

## 2014-07-24 DIAGNOSIS — N2581 Secondary hyperparathyroidism of renal origin: Secondary | ICD-10-CM | POA: Diagnosis not present

## 2014-07-24 DIAGNOSIS — E119 Type 2 diabetes mellitus without complications: Secondary | ICD-10-CM | POA: Diagnosis not present

## 2014-07-24 DIAGNOSIS — D631 Anemia in chronic kidney disease: Secondary | ICD-10-CM | POA: Diagnosis not present

## 2014-07-27 DIAGNOSIS — D509 Iron deficiency anemia, unspecified: Secondary | ICD-10-CM | POA: Diagnosis not present

## 2014-07-27 DIAGNOSIS — N2581 Secondary hyperparathyroidism of renal origin: Secondary | ICD-10-CM | POA: Diagnosis not present

## 2014-07-27 DIAGNOSIS — N186 End stage renal disease: Secondary | ICD-10-CM | POA: Diagnosis not present

## 2014-07-27 DIAGNOSIS — D631 Anemia in chronic kidney disease: Secondary | ICD-10-CM | POA: Diagnosis not present

## 2014-07-27 DIAGNOSIS — E119 Type 2 diabetes mellitus without complications: Secondary | ICD-10-CM | POA: Diagnosis not present

## 2014-07-29 DIAGNOSIS — D509 Iron deficiency anemia, unspecified: Secondary | ICD-10-CM | POA: Diagnosis not present

## 2014-07-29 DIAGNOSIS — N2581 Secondary hyperparathyroidism of renal origin: Secondary | ICD-10-CM | POA: Diagnosis not present

## 2014-07-29 DIAGNOSIS — D631 Anemia in chronic kidney disease: Secondary | ICD-10-CM | POA: Diagnosis not present

## 2014-07-29 DIAGNOSIS — N186 End stage renal disease: Secondary | ICD-10-CM | POA: Diagnosis not present

## 2014-07-29 DIAGNOSIS — E119 Type 2 diabetes mellitus without complications: Secondary | ICD-10-CM | POA: Diagnosis not present

## 2014-07-30 DIAGNOSIS — B351 Tinea unguium: Secondary | ICD-10-CM | POA: Diagnosis not present

## 2014-07-30 DIAGNOSIS — M79609 Pain in unspecified limb: Secondary | ICD-10-CM | POA: Diagnosis not present

## 2014-07-31 DIAGNOSIS — N186 End stage renal disease: Secondary | ICD-10-CM | POA: Diagnosis not present

## 2014-07-31 DIAGNOSIS — N2581 Secondary hyperparathyroidism of renal origin: Secondary | ICD-10-CM | POA: Diagnosis not present

## 2014-07-31 DIAGNOSIS — D509 Iron deficiency anemia, unspecified: Secondary | ICD-10-CM | POA: Diagnosis not present

## 2014-07-31 DIAGNOSIS — E119 Type 2 diabetes mellitus without complications: Secondary | ICD-10-CM | POA: Diagnosis not present

## 2014-07-31 DIAGNOSIS — D631 Anemia in chronic kidney disease: Secondary | ICD-10-CM | POA: Diagnosis not present

## 2014-08-03 DIAGNOSIS — N2581 Secondary hyperparathyroidism of renal origin: Secondary | ICD-10-CM | POA: Diagnosis not present

## 2014-08-03 DIAGNOSIS — E119 Type 2 diabetes mellitus without complications: Secondary | ICD-10-CM | POA: Diagnosis not present

## 2014-08-03 DIAGNOSIS — D631 Anemia in chronic kidney disease: Secondary | ICD-10-CM | POA: Diagnosis not present

## 2014-08-03 DIAGNOSIS — D509 Iron deficiency anemia, unspecified: Secondary | ICD-10-CM | POA: Diagnosis not present

## 2014-08-03 DIAGNOSIS — N186 End stage renal disease: Secondary | ICD-10-CM | POA: Diagnosis not present

## 2014-08-05 DIAGNOSIS — E1129 Type 2 diabetes mellitus with other diabetic kidney complication: Secondary | ICD-10-CM | POA: Diagnosis not present

## 2014-08-05 DIAGNOSIS — E119 Type 2 diabetes mellitus without complications: Secondary | ICD-10-CM | POA: Diagnosis not present

## 2014-08-05 DIAGNOSIS — D631 Anemia in chronic kidney disease: Secondary | ICD-10-CM | POA: Diagnosis not present

## 2014-08-05 DIAGNOSIS — N2581 Secondary hyperparathyroidism of renal origin: Secondary | ICD-10-CM | POA: Diagnosis not present

## 2014-08-05 DIAGNOSIS — D509 Iron deficiency anemia, unspecified: Secondary | ICD-10-CM | POA: Diagnosis not present

## 2014-08-05 DIAGNOSIS — N186 End stage renal disease: Secondary | ICD-10-CM | POA: Diagnosis not present

## 2014-08-07 DIAGNOSIS — N186 End stage renal disease: Secondary | ICD-10-CM | POA: Diagnosis not present

## 2014-08-07 DIAGNOSIS — D631 Anemia in chronic kidney disease: Secondary | ICD-10-CM | POA: Diagnosis not present

## 2014-08-07 DIAGNOSIS — N2581 Secondary hyperparathyroidism of renal origin: Secondary | ICD-10-CM | POA: Diagnosis not present

## 2014-08-07 DIAGNOSIS — D509 Iron deficiency anemia, unspecified: Secondary | ICD-10-CM | POA: Diagnosis not present

## 2014-08-07 DIAGNOSIS — E119 Type 2 diabetes mellitus without complications: Secondary | ICD-10-CM | POA: Diagnosis not present

## 2014-08-09 DIAGNOSIS — N186 End stage renal disease: Secondary | ICD-10-CM | POA: Diagnosis not present

## 2014-08-09 DIAGNOSIS — E1129 Type 2 diabetes mellitus with other diabetic kidney complication: Secondary | ICD-10-CM | POA: Diagnosis not present

## 2014-08-09 DIAGNOSIS — Z992 Dependence on renal dialysis: Secondary | ICD-10-CM | POA: Diagnosis not present

## 2014-08-10 DIAGNOSIS — D509 Iron deficiency anemia, unspecified: Secondary | ICD-10-CM | POA: Diagnosis not present

## 2014-08-10 DIAGNOSIS — N186 End stage renal disease: Secondary | ICD-10-CM | POA: Diagnosis not present

## 2014-08-10 DIAGNOSIS — N2581 Secondary hyperparathyroidism of renal origin: Secondary | ICD-10-CM | POA: Diagnosis not present

## 2014-08-10 DIAGNOSIS — E119 Type 2 diabetes mellitus without complications: Secondary | ICD-10-CM | POA: Diagnosis not present

## 2014-08-10 DIAGNOSIS — D631 Anemia in chronic kidney disease: Secondary | ICD-10-CM | POA: Diagnosis not present

## 2014-08-12 DIAGNOSIS — D631 Anemia in chronic kidney disease: Secondary | ICD-10-CM | POA: Diagnosis not present

## 2014-08-12 DIAGNOSIS — D509 Iron deficiency anemia, unspecified: Secondary | ICD-10-CM | POA: Diagnosis not present

## 2014-08-12 DIAGNOSIS — N186 End stage renal disease: Secondary | ICD-10-CM | POA: Diagnosis not present

## 2014-08-12 DIAGNOSIS — N2581 Secondary hyperparathyroidism of renal origin: Secondary | ICD-10-CM | POA: Diagnosis not present

## 2014-08-12 DIAGNOSIS — E119 Type 2 diabetes mellitus without complications: Secondary | ICD-10-CM | POA: Diagnosis not present

## 2014-08-14 DIAGNOSIS — N2581 Secondary hyperparathyroidism of renal origin: Secondary | ICD-10-CM | POA: Diagnosis not present

## 2014-08-14 DIAGNOSIS — E119 Type 2 diabetes mellitus without complications: Secondary | ICD-10-CM | POA: Diagnosis not present

## 2014-08-14 DIAGNOSIS — D509 Iron deficiency anemia, unspecified: Secondary | ICD-10-CM | POA: Diagnosis not present

## 2014-08-14 DIAGNOSIS — N186 End stage renal disease: Secondary | ICD-10-CM | POA: Diagnosis not present

## 2014-08-14 DIAGNOSIS — D631 Anemia in chronic kidney disease: Secondary | ICD-10-CM | POA: Diagnosis not present

## 2014-08-17 DIAGNOSIS — E119 Type 2 diabetes mellitus without complications: Secondary | ICD-10-CM | POA: Diagnosis not present

## 2014-08-17 DIAGNOSIS — D509 Iron deficiency anemia, unspecified: Secondary | ICD-10-CM | POA: Diagnosis not present

## 2014-08-17 DIAGNOSIS — N2581 Secondary hyperparathyroidism of renal origin: Secondary | ICD-10-CM | POA: Diagnosis not present

## 2014-08-17 DIAGNOSIS — D631 Anemia in chronic kidney disease: Secondary | ICD-10-CM | POA: Diagnosis not present

## 2014-08-17 DIAGNOSIS — N186 End stage renal disease: Secondary | ICD-10-CM | POA: Diagnosis not present

## 2014-08-19 DIAGNOSIS — D509 Iron deficiency anemia, unspecified: Secondary | ICD-10-CM | POA: Diagnosis not present

## 2014-08-19 DIAGNOSIS — N2581 Secondary hyperparathyroidism of renal origin: Secondary | ICD-10-CM | POA: Diagnosis not present

## 2014-08-19 DIAGNOSIS — D631 Anemia in chronic kidney disease: Secondary | ICD-10-CM | POA: Diagnosis not present

## 2014-08-19 DIAGNOSIS — E119 Type 2 diabetes mellitus without complications: Secondary | ICD-10-CM | POA: Diagnosis not present

## 2014-08-19 DIAGNOSIS — N186 End stage renal disease: Secondary | ICD-10-CM | POA: Diagnosis not present

## 2014-08-21 DIAGNOSIS — N2581 Secondary hyperparathyroidism of renal origin: Secondary | ICD-10-CM | POA: Diagnosis not present

## 2014-08-21 DIAGNOSIS — N186 End stage renal disease: Secondary | ICD-10-CM | POA: Diagnosis not present

## 2014-08-21 DIAGNOSIS — D631 Anemia in chronic kidney disease: Secondary | ICD-10-CM | POA: Diagnosis not present

## 2014-08-21 DIAGNOSIS — D509 Iron deficiency anemia, unspecified: Secondary | ICD-10-CM | POA: Diagnosis not present

## 2014-08-21 DIAGNOSIS — E119 Type 2 diabetes mellitus without complications: Secondary | ICD-10-CM | POA: Diagnosis not present

## 2014-08-24 DIAGNOSIS — D509 Iron deficiency anemia, unspecified: Secondary | ICD-10-CM | POA: Diagnosis not present

## 2014-08-24 DIAGNOSIS — E119 Type 2 diabetes mellitus without complications: Secondary | ICD-10-CM | POA: Diagnosis not present

## 2014-08-24 DIAGNOSIS — D631 Anemia in chronic kidney disease: Secondary | ICD-10-CM | POA: Diagnosis not present

## 2014-08-24 DIAGNOSIS — N2581 Secondary hyperparathyroidism of renal origin: Secondary | ICD-10-CM | POA: Diagnosis not present

## 2014-08-24 DIAGNOSIS — N186 End stage renal disease: Secondary | ICD-10-CM | POA: Diagnosis not present

## 2014-08-26 DIAGNOSIS — D631 Anemia in chronic kidney disease: Secondary | ICD-10-CM | POA: Diagnosis not present

## 2014-08-26 DIAGNOSIS — E119 Type 2 diabetes mellitus without complications: Secondary | ICD-10-CM | POA: Diagnosis not present

## 2014-08-26 DIAGNOSIS — N2581 Secondary hyperparathyroidism of renal origin: Secondary | ICD-10-CM | POA: Diagnosis not present

## 2014-08-26 DIAGNOSIS — D509 Iron deficiency anemia, unspecified: Secondary | ICD-10-CM | POA: Diagnosis not present

## 2014-08-26 DIAGNOSIS — N186 End stage renal disease: Secondary | ICD-10-CM | POA: Diagnosis not present

## 2014-08-28 DIAGNOSIS — D509 Iron deficiency anemia, unspecified: Secondary | ICD-10-CM | POA: Diagnosis not present

## 2014-08-28 DIAGNOSIS — E119 Type 2 diabetes mellitus without complications: Secondary | ICD-10-CM | POA: Diagnosis not present

## 2014-08-28 DIAGNOSIS — N2581 Secondary hyperparathyroidism of renal origin: Secondary | ICD-10-CM | POA: Diagnosis not present

## 2014-08-28 DIAGNOSIS — N186 End stage renal disease: Secondary | ICD-10-CM | POA: Diagnosis not present

## 2014-08-28 DIAGNOSIS — D631 Anemia in chronic kidney disease: Secondary | ICD-10-CM | POA: Diagnosis not present

## 2014-08-31 DIAGNOSIS — N2581 Secondary hyperparathyroidism of renal origin: Secondary | ICD-10-CM | POA: Diagnosis not present

## 2014-08-31 DIAGNOSIS — D631 Anemia in chronic kidney disease: Secondary | ICD-10-CM | POA: Diagnosis not present

## 2014-08-31 DIAGNOSIS — E119 Type 2 diabetes mellitus without complications: Secondary | ICD-10-CM | POA: Diagnosis not present

## 2014-08-31 DIAGNOSIS — N186 End stage renal disease: Secondary | ICD-10-CM | POA: Diagnosis not present

## 2014-08-31 DIAGNOSIS — D509 Iron deficiency anemia, unspecified: Secondary | ICD-10-CM | POA: Diagnosis not present

## 2014-09-02 DIAGNOSIS — D631 Anemia in chronic kidney disease: Secondary | ICD-10-CM | POA: Diagnosis not present

## 2014-09-02 DIAGNOSIS — N186 End stage renal disease: Secondary | ICD-10-CM | POA: Diagnosis not present

## 2014-09-02 DIAGNOSIS — E119 Type 2 diabetes mellitus without complications: Secondary | ICD-10-CM | POA: Diagnosis not present

## 2014-09-02 DIAGNOSIS — D509 Iron deficiency anemia, unspecified: Secondary | ICD-10-CM | POA: Diagnosis not present

## 2014-09-02 DIAGNOSIS — N2581 Secondary hyperparathyroidism of renal origin: Secondary | ICD-10-CM | POA: Diagnosis not present

## 2014-09-04 DIAGNOSIS — N2581 Secondary hyperparathyroidism of renal origin: Secondary | ICD-10-CM | POA: Diagnosis not present

## 2014-09-04 DIAGNOSIS — D509 Iron deficiency anemia, unspecified: Secondary | ICD-10-CM | POA: Diagnosis not present

## 2014-09-04 DIAGNOSIS — D631 Anemia in chronic kidney disease: Secondary | ICD-10-CM | POA: Diagnosis not present

## 2014-09-04 DIAGNOSIS — E119 Type 2 diabetes mellitus without complications: Secondary | ICD-10-CM | POA: Diagnosis not present

## 2014-09-04 DIAGNOSIS — N186 End stage renal disease: Secondary | ICD-10-CM | POA: Diagnosis not present

## 2014-09-07 DIAGNOSIS — N186 End stage renal disease: Secondary | ICD-10-CM | POA: Diagnosis not present

## 2014-09-07 DIAGNOSIS — N2581 Secondary hyperparathyroidism of renal origin: Secondary | ICD-10-CM | POA: Diagnosis not present

## 2014-09-07 DIAGNOSIS — E119 Type 2 diabetes mellitus without complications: Secondary | ICD-10-CM | POA: Diagnosis not present

## 2014-09-07 DIAGNOSIS — D509 Iron deficiency anemia, unspecified: Secondary | ICD-10-CM | POA: Diagnosis not present

## 2014-09-07 DIAGNOSIS — D631 Anemia in chronic kidney disease: Secondary | ICD-10-CM | POA: Diagnosis not present

## 2014-09-09 DIAGNOSIS — N2581 Secondary hyperparathyroidism of renal origin: Secondary | ICD-10-CM | POA: Diagnosis not present

## 2014-09-09 DIAGNOSIS — D631 Anemia in chronic kidney disease: Secondary | ICD-10-CM | POA: Diagnosis not present

## 2014-09-09 DIAGNOSIS — Z992 Dependence on renal dialysis: Secondary | ICD-10-CM | POA: Diagnosis not present

## 2014-09-09 DIAGNOSIS — D509 Iron deficiency anemia, unspecified: Secondary | ICD-10-CM | POA: Diagnosis not present

## 2014-09-09 DIAGNOSIS — E119 Type 2 diabetes mellitus without complications: Secondary | ICD-10-CM | POA: Diagnosis not present

## 2014-09-09 DIAGNOSIS — E1129 Type 2 diabetes mellitus with other diabetic kidney complication: Secondary | ICD-10-CM | POA: Diagnosis not present

## 2014-09-09 DIAGNOSIS — N186 End stage renal disease: Secondary | ICD-10-CM | POA: Diagnosis not present

## 2014-09-11 DIAGNOSIS — N186 End stage renal disease: Secondary | ICD-10-CM | POA: Diagnosis not present

## 2014-09-11 DIAGNOSIS — E119 Type 2 diabetes mellitus without complications: Secondary | ICD-10-CM | POA: Diagnosis not present

## 2014-09-11 DIAGNOSIS — D631 Anemia in chronic kidney disease: Secondary | ICD-10-CM | POA: Diagnosis not present

## 2014-09-11 DIAGNOSIS — N2581 Secondary hyperparathyroidism of renal origin: Secondary | ICD-10-CM | POA: Diagnosis not present

## 2014-09-11 DIAGNOSIS — D509 Iron deficiency anemia, unspecified: Secondary | ICD-10-CM | POA: Diagnosis not present

## 2014-09-14 DIAGNOSIS — D509 Iron deficiency anemia, unspecified: Secondary | ICD-10-CM | POA: Diagnosis not present

## 2014-09-14 DIAGNOSIS — N186 End stage renal disease: Secondary | ICD-10-CM | POA: Diagnosis not present

## 2014-09-14 DIAGNOSIS — N2581 Secondary hyperparathyroidism of renal origin: Secondary | ICD-10-CM | POA: Diagnosis not present

## 2014-09-14 DIAGNOSIS — E119 Type 2 diabetes mellitus without complications: Secondary | ICD-10-CM | POA: Diagnosis not present

## 2014-09-14 DIAGNOSIS — D631 Anemia in chronic kidney disease: Secondary | ICD-10-CM | POA: Diagnosis not present

## 2014-09-16 DIAGNOSIS — D509 Iron deficiency anemia, unspecified: Secondary | ICD-10-CM | POA: Diagnosis not present

## 2014-09-16 DIAGNOSIS — E119 Type 2 diabetes mellitus without complications: Secondary | ICD-10-CM | POA: Diagnosis not present

## 2014-09-16 DIAGNOSIS — D631 Anemia in chronic kidney disease: Secondary | ICD-10-CM | POA: Diagnosis not present

## 2014-09-16 DIAGNOSIS — N186 End stage renal disease: Secondary | ICD-10-CM | POA: Diagnosis not present

## 2014-09-16 DIAGNOSIS — N2581 Secondary hyperparathyroidism of renal origin: Secondary | ICD-10-CM | POA: Diagnosis not present

## 2014-09-18 DIAGNOSIS — E119 Type 2 diabetes mellitus without complications: Secondary | ICD-10-CM | POA: Diagnosis not present

## 2014-09-18 DIAGNOSIS — N186 End stage renal disease: Secondary | ICD-10-CM | POA: Diagnosis not present

## 2014-09-18 DIAGNOSIS — D509 Iron deficiency anemia, unspecified: Secondary | ICD-10-CM | POA: Diagnosis not present

## 2014-09-18 DIAGNOSIS — N2581 Secondary hyperparathyroidism of renal origin: Secondary | ICD-10-CM | POA: Diagnosis not present

## 2014-09-18 DIAGNOSIS — D631 Anemia in chronic kidney disease: Secondary | ICD-10-CM | POA: Diagnosis not present

## 2014-09-21 DIAGNOSIS — N186 End stage renal disease: Secondary | ICD-10-CM | POA: Diagnosis not present

## 2014-09-21 DIAGNOSIS — N2581 Secondary hyperparathyroidism of renal origin: Secondary | ICD-10-CM | POA: Diagnosis not present

## 2014-09-21 DIAGNOSIS — D631 Anemia in chronic kidney disease: Secondary | ICD-10-CM | POA: Diagnosis not present

## 2014-09-21 DIAGNOSIS — E119 Type 2 diabetes mellitus without complications: Secondary | ICD-10-CM | POA: Diagnosis not present

## 2014-09-21 DIAGNOSIS — D509 Iron deficiency anemia, unspecified: Secondary | ICD-10-CM | POA: Diagnosis not present

## 2014-09-23 DIAGNOSIS — E119 Type 2 diabetes mellitus without complications: Secondary | ICD-10-CM | POA: Diagnosis not present

## 2014-09-23 DIAGNOSIS — D509 Iron deficiency anemia, unspecified: Secondary | ICD-10-CM | POA: Diagnosis not present

## 2014-09-23 DIAGNOSIS — D631 Anemia in chronic kidney disease: Secondary | ICD-10-CM | POA: Diagnosis not present

## 2014-09-23 DIAGNOSIS — N186 End stage renal disease: Secondary | ICD-10-CM | POA: Diagnosis not present

## 2014-09-23 DIAGNOSIS — N2581 Secondary hyperparathyroidism of renal origin: Secondary | ICD-10-CM | POA: Diagnosis not present

## 2014-09-24 ENCOUNTER — Other Ambulatory Visit: Payer: Self-pay | Admitting: Internal Medicine

## 2014-09-25 DIAGNOSIS — N186 End stage renal disease: Secondary | ICD-10-CM | POA: Diagnosis not present

## 2014-09-25 DIAGNOSIS — D631 Anemia in chronic kidney disease: Secondary | ICD-10-CM | POA: Diagnosis not present

## 2014-09-25 DIAGNOSIS — E119 Type 2 diabetes mellitus without complications: Secondary | ICD-10-CM | POA: Diagnosis not present

## 2014-09-25 DIAGNOSIS — N2581 Secondary hyperparathyroidism of renal origin: Secondary | ICD-10-CM | POA: Diagnosis not present

## 2014-09-25 DIAGNOSIS — D509 Iron deficiency anemia, unspecified: Secondary | ICD-10-CM | POA: Diagnosis not present

## 2014-09-28 DIAGNOSIS — E119 Type 2 diabetes mellitus without complications: Secondary | ICD-10-CM | POA: Diagnosis not present

## 2014-09-28 DIAGNOSIS — N186 End stage renal disease: Secondary | ICD-10-CM | POA: Diagnosis not present

## 2014-09-28 DIAGNOSIS — D509 Iron deficiency anemia, unspecified: Secondary | ICD-10-CM | POA: Diagnosis not present

## 2014-09-28 DIAGNOSIS — N2581 Secondary hyperparathyroidism of renal origin: Secondary | ICD-10-CM | POA: Diagnosis not present

## 2014-09-28 DIAGNOSIS — D631 Anemia in chronic kidney disease: Secondary | ICD-10-CM | POA: Diagnosis not present

## 2014-09-30 DIAGNOSIS — E119 Type 2 diabetes mellitus without complications: Secondary | ICD-10-CM | POA: Diagnosis not present

## 2014-09-30 DIAGNOSIS — D631 Anemia in chronic kidney disease: Secondary | ICD-10-CM | POA: Diagnosis not present

## 2014-09-30 DIAGNOSIS — N2581 Secondary hyperparathyroidism of renal origin: Secondary | ICD-10-CM | POA: Diagnosis not present

## 2014-09-30 DIAGNOSIS — D509 Iron deficiency anemia, unspecified: Secondary | ICD-10-CM | POA: Diagnosis not present

## 2014-09-30 DIAGNOSIS — N186 End stage renal disease: Secondary | ICD-10-CM | POA: Diagnosis not present

## 2014-10-02 DIAGNOSIS — D509 Iron deficiency anemia, unspecified: Secondary | ICD-10-CM | POA: Diagnosis not present

## 2014-10-02 DIAGNOSIS — N186 End stage renal disease: Secondary | ICD-10-CM | POA: Diagnosis not present

## 2014-10-02 DIAGNOSIS — D631 Anemia in chronic kidney disease: Secondary | ICD-10-CM | POA: Diagnosis not present

## 2014-10-02 DIAGNOSIS — N2581 Secondary hyperparathyroidism of renal origin: Secondary | ICD-10-CM | POA: Diagnosis not present

## 2014-10-02 DIAGNOSIS — E119 Type 2 diabetes mellitus without complications: Secondary | ICD-10-CM | POA: Diagnosis not present

## 2014-10-05 DIAGNOSIS — N186 End stage renal disease: Secondary | ICD-10-CM | POA: Diagnosis not present

## 2014-10-05 DIAGNOSIS — D509 Iron deficiency anemia, unspecified: Secondary | ICD-10-CM | POA: Diagnosis not present

## 2014-10-05 DIAGNOSIS — D631 Anemia in chronic kidney disease: Secondary | ICD-10-CM | POA: Diagnosis not present

## 2014-10-05 DIAGNOSIS — N2581 Secondary hyperparathyroidism of renal origin: Secondary | ICD-10-CM | POA: Diagnosis not present

## 2014-10-05 DIAGNOSIS — E119 Type 2 diabetes mellitus without complications: Secondary | ICD-10-CM | POA: Diagnosis not present

## 2014-10-07 DIAGNOSIS — N186 End stage renal disease: Secondary | ICD-10-CM | POA: Diagnosis not present

## 2014-10-07 DIAGNOSIS — D631 Anemia in chronic kidney disease: Secondary | ICD-10-CM | POA: Diagnosis not present

## 2014-10-07 DIAGNOSIS — D509 Iron deficiency anemia, unspecified: Secondary | ICD-10-CM | POA: Diagnosis not present

## 2014-10-07 DIAGNOSIS — E119 Type 2 diabetes mellitus without complications: Secondary | ICD-10-CM | POA: Diagnosis not present

## 2014-10-07 DIAGNOSIS — N2581 Secondary hyperparathyroidism of renal origin: Secondary | ICD-10-CM | POA: Diagnosis not present

## 2014-10-09 DIAGNOSIS — N2581 Secondary hyperparathyroidism of renal origin: Secondary | ICD-10-CM | POA: Diagnosis not present

## 2014-10-09 DIAGNOSIS — N186 End stage renal disease: Secondary | ICD-10-CM | POA: Diagnosis not present

## 2014-10-09 DIAGNOSIS — D631 Anemia in chronic kidney disease: Secondary | ICD-10-CM | POA: Diagnosis not present

## 2014-10-09 DIAGNOSIS — E119 Type 2 diabetes mellitus without complications: Secondary | ICD-10-CM | POA: Diagnosis not present

## 2014-10-09 DIAGNOSIS — Z992 Dependence on renal dialysis: Secondary | ICD-10-CM | POA: Diagnosis not present

## 2014-10-09 DIAGNOSIS — E1129 Type 2 diabetes mellitus with other diabetic kidney complication: Secondary | ICD-10-CM | POA: Diagnosis not present

## 2014-10-09 DIAGNOSIS — D509 Iron deficiency anemia, unspecified: Secondary | ICD-10-CM | POA: Diagnosis not present

## 2014-10-12 DIAGNOSIS — E119 Type 2 diabetes mellitus without complications: Secondary | ICD-10-CM | POA: Diagnosis not present

## 2014-10-12 DIAGNOSIS — D509 Iron deficiency anemia, unspecified: Secondary | ICD-10-CM | POA: Diagnosis not present

## 2014-10-12 DIAGNOSIS — N186 End stage renal disease: Secondary | ICD-10-CM | POA: Diagnosis not present

## 2014-10-12 DIAGNOSIS — Z23 Encounter for immunization: Secondary | ICD-10-CM | POA: Diagnosis not present

## 2014-10-12 DIAGNOSIS — N2581 Secondary hyperparathyroidism of renal origin: Secondary | ICD-10-CM | POA: Diagnosis not present

## 2014-10-12 DIAGNOSIS — D631 Anemia in chronic kidney disease: Secondary | ICD-10-CM | POA: Diagnosis not present

## 2014-10-14 DIAGNOSIS — D631 Anemia in chronic kidney disease: Secondary | ICD-10-CM | POA: Diagnosis not present

## 2014-10-14 DIAGNOSIS — Z23 Encounter for immunization: Secondary | ICD-10-CM | POA: Diagnosis not present

## 2014-10-14 DIAGNOSIS — E119 Type 2 diabetes mellitus without complications: Secondary | ICD-10-CM | POA: Diagnosis not present

## 2014-10-14 DIAGNOSIS — D509 Iron deficiency anemia, unspecified: Secondary | ICD-10-CM | POA: Diagnosis not present

## 2014-10-14 DIAGNOSIS — N186 End stage renal disease: Secondary | ICD-10-CM | POA: Diagnosis not present

## 2014-10-14 DIAGNOSIS — N2581 Secondary hyperparathyroidism of renal origin: Secondary | ICD-10-CM | POA: Diagnosis not present

## 2014-10-16 DIAGNOSIS — Z23 Encounter for immunization: Secondary | ICD-10-CM | POA: Diagnosis not present

## 2014-10-16 DIAGNOSIS — E119 Type 2 diabetes mellitus without complications: Secondary | ICD-10-CM | POA: Diagnosis not present

## 2014-10-16 DIAGNOSIS — D631 Anemia in chronic kidney disease: Secondary | ICD-10-CM | POA: Diagnosis not present

## 2014-10-16 DIAGNOSIS — D509 Iron deficiency anemia, unspecified: Secondary | ICD-10-CM | POA: Diagnosis not present

## 2014-10-16 DIAGNOSIS — N186 End stage renal disease: Secondary | ICD-10-CM | POA: Diagnosis not present

## 2014-10-16 DIAGNOSIS — N2581 Secondary hyperparathyroidism of renal origin: Secondary | ICD-10-CM | POA: Diagnosis not present

## 2014-10-19 DIAGNOSIS — Z23 Encounter for immunization: Secondary | ICD-10-CM | POA: Diagnosis not present

## 2014-10-19 DIAGNOSIS — N2581 Secondary hyperparathyroidism of renal origin: Secondary | ICD-10-CM | POA: Diagnosis not present

## 2014-10-19 DIAGNOSIS — D509 Iron deficiency anemia, unspecified: Secondary | ICD-10-CM | POA: Diagnosis not present

## 2014-10-19 DIAGNOSIS — D631 Anemia in chronic kidney disease: Secondary | ICD-10-CM | POA: Diagnosis not present

## 2014-10-19 DIAGNOSIS — N186 End stage renal disease: Secondary | ICD-10-CM | POA: Diagnosis not present

## 2014-10-19 DIAGNOSIS — E119 Type 2 diabetes mellitus without complications: Secondary | ICD-10-CM | POA: Diagnosis not present

## 2014-10-21 DIAGNOSIS — N186 End stage renal disease: Secondary | ICD-10-CM | POA: Diagnosis not present

## 2014-10-21 DIAGNOSIS — D631 Anemia in chronic kidney disease: Secondary | ICD-10-CM | POA: Diagnosis not present

## 2014-10-21 DIAGNOSIS — D509 Iron deficiency anemia, unspecified: Secondary | ICD-10-CM | POA: Diagnosis not present

## 2014-10-21 DIAGNOSIS — E119 Type 2 diabetes mellitus without complications: Secondary | ICD-10-CM | POA: Diagnosis not present

## 2014-10-21 DIAGNOSIS — N2581 Secondary hyperparathyroidism of renal origin: Secondary | ICD-10-CM | POA: Diagnosis not present

## 2014-10-21 DIAGNOSIS — Z23 Encounter for immunization: Secondary | ICD-10-CM | POA: Diagnosis not present

## 2014-10-22 DIAGNOSIS — H401133 Primary open-angle glaucoma, bilateral, severe stage: Secondary | ICD-10-CM | POA: Diagnosis not present

## 2014-10-23 ENCOUNTER — Inpatient Hospital Stay (HOSPITAL_COMMUNITY)
Admission: EM | Admit: 2014-10-23 | Discharge: 2014-10-29 | DRG: 246 | Disposition: A | Discharge status: Home / Self Care | Payer: Medicare Other | Attending: Internal Medicine | Admitting: Internal Medicine

## 2014-10-23 ENCOUNTER — Encounter (HOSPITAL_COMMUNITY): Payer: Self-pay | Admitting: Nurse Practitioner

## 2014-10-23 ENCOUNTER — Emergency Department (HOSPITAL_COMMUNITY): Payer: Medicare Other

## 2014-10-23 DIAGNOSIS — Z85038 Personal history of other malignant neoplasm of large intestine: Secondary | ICD-10-CM

## 2014-10-23 DIAGNOSIS — Z89431 Acquired absence of right foot: Secondary | ICD-10-CM | POA: Diagnosis not present

## 2014-10-23 DIAGNOSIS — I509 Heart failure, unspecified: Secondary | ICD-10-CM

## 2014-10-23 DIAGNOSIS — I132 Hypertensive heart and chronic kidney disease with heart failure and with stage 5 chronic kidney disease, or end stage renal disease: Secondary | ICD-10-CM | POA: Diagnosis present

## 2014-10-23 DIAGNOSIS — I471 Supraventricular tachycardia: Secondary | ICD-10-CM | POA: Diagnosis present

## 2014-10-23 DIAGNOSIS — I2510 Atherosclerotic heart disease of native coronary artery without angina pectoris: Secondary | ICD-10-CM | POA: Diagnosis present

## 2014-10-23 DIAGNOSIS — D649 Anemia, unspecified: Secondary | ICD-10-CM | POA: Diagnosis present

## 2014-10-23 DIAGNOSIS — Z9842 Cataract extraction status, left eye: Secondary | ICD-10-CM | POA: Diagnosis not present

## 2014-10-23 DIAGNOSIS — E785 Hyperlipidemia, unspecified: Secondary | ICD-10-CM | POA: Diagnosis present

## 2014-10-23 DIAGNOSIS — Z933 Colostomy status: Secondary | ICD-10-CM | POA: Diagnosis not present

## 2014-10-23 DIAGNOSIS — I214 Non-ST elevation (NSTEMI) myocardial infarction: Principal | ICD-10-CM | POA: Diagnosis present

## 2014-10-23 DIAGNOSIS — I080 Rheumatic disorders of both mitral and aortic valves: Secondary | ICD-10-CM | POA: Diagnosis present

## 2014-10-23 DIAGNOSIS — E119 Type 2 diabetes mellitus without complications: Secondary | ICD-10-CM | POA: Diagnosis not present

## 2014-10-23 DIAGNOSIS — N186 End stage renal disease: Secondary | ICD-10-CM | POA: Diagnosis not present

## 2014-10-23 DIAGNOSIS — D631 Anemia in chronic kidney disease: Secondary | ICD-10-CM | POA: Diagnosis not present

## 2014-10-23 DIAGNOSIS — I120 Hypertensive chronic kidney disease with stage 5 chronic kidney disease or end stage renal disease: Secondary | ICD-10-CM | POA: Diagnosis not present

## 2014-10-23 DIAGNOSIS — Z7982 Long term (current) use of aspirin: Secondary | ICD-10-CM

## 2014-10-23 DIAGNOSIS — Z794 Long term (current) use of insulin: Secondary | ICD-10-CM | POA: Diagnosis not present

## 2014-10-23 DIAGNOSIS — R778 Other specified abnormalities of plasma proteins: Secondary | ICD-10-CM | POA: Diagnosis present

## 2014-10-23 DIAGNOSIS — I5043 Acute on chronic combined systolic (congestive) and diastolic (congestive) heart failure: Secondary | ICD-10-CM | POA: Diagnosis not present

## 2014-10-23 DIAGNOSIS — R0609 Other forms of dyspnea: Secondary | ICD-10-CM | POA: Diagnosis present

## 2014-10-23 DIAGNOSIS — I255 Ischemic cardiomyopathy: Secondary | ICD-10-CM | POA: Diagnosis present

## 2014-10-23 DIAGNOSIS — N2581 Secondary hyperparathyroidism of renal origin: Secondary | ICD-10-CM | POA: Diagnosis present

## 2014-10-23 DIAGNOSIS — M898X9 Other specified disorders of bone, unspecified site: Secondary | ICD-10-CM | POA: Diagnosis present

## 2014-10-23 DIAGNOSIS — I252 Old myocardial infarction: Secondary | ICD-10-CM | POA: Diagnosis not present

## 2014-10-23 DIAGNOSIS — K219 Gastro-esophageal reflux disease without esophagitis: Secondary | ICD-10-CM | POA: Diagnosis present

## 2014-10-23 DIAGNOSIS — Z992 Dependence on renal dialysis: Secondary | ICD-10-CM | POA: Diagnosis not present

## 2014-10-23 DIAGNOSIS — Z8673 Personal history of transient ischemic attack (TIA), and cerebral infarction without residual deficits: Secondary | ICD-10-CM | POA: Diagnosis not present

## 2014-10-23 DIAGNOSIS — H409 Unspecified glaucoma: Secondary | ICD-10-CM | POA: Diagnosis present

## 2014-10-23 DIAGNOSIS — Z90710 Acquired absence of both cervix and uterus: Secondary | ICD-10-CM | POA: Diagnosis not present

## 2014-10-23 DIAGNOSIS — R0789 Other chest pain: Secondary | ICD-10-CM | POA: Diagnosis not present

## 2014-10-23 DIAGNOSIS — I25111 Atherosclerotic heart disease of native coronary artery with angina pectoris with documented spasm: Secondary | ICD-10-CM | POA: Diagnosis not present

## 2014-10-23 DIAGNOSIS — I440 Atrioventricular block, first degree: Secondary | ICD-10-CM | POA: Diagnosis present

## 2014-10-23 DIAGNOSIS — E1122 Type 2 diabetes mellitus with diabetic chronic kidney disease: Secondary | ICD-10-CM | POA: Diagnosis present

## 2014-10-23 DIAGNOSIS — R0602 Shortness of breath: Secondary | ICD-10-CM | POA: Diagnosis not present

## 2014-10-23 DIAGNOSIS — Z6839 Body mass index (BMI) 39.0-39.9, adult: Secondary | ICD-10-CM | POA: Diagnosis not present

## 2014-10-23 DIAGNOSIS — I447 Left bundle-branch block, unspecified: Secondary | ICD-10-CM | POA: Diagnosis present

## 2014-10-23 DIAGNOSIS — I5021 Acute systolic (congestive) heart failure: Secondary | ICD-10-CM | POA: Diagnosis not present

## 2014-10-23 DIAGNOSIS — J9601 Acute respiratory failure with hypoxia: Secondary | ICD-10-CM | POA: Diagnosis not present

## 2014-10-23 DIAGNOSIS — R7989 Other specified abnormal findings of blood chemistry: Secondary | ICD-10-CM | POA: Diagnosis not present

## 2014-10-23 DIAGNOSIS — Z79899 Other long term (current) drug therapy: Secondary | ICD-10-CM | POA: Diagnosis not present

## 2014-10-23 DIAGNOSIS — E1129 Type 2 diabetes mellitus with other diabetic kidney complication: Secondary | ICD-10-CM | POA: Diagnosis not present

## 2014-10-23 HISTORY — DX: Acute respiratory failure with hypoxia: J96.01

## 2014-10-23 LAB — CBC WITH DIFFERENTIAL/PLATELET
Basophils Absolute: 0 10*3/uL (ref 0.0–0.1)
Basophils Relative: 0 %
EOS ABS: 0 10*3/uL (ref 0.0–0.7)
Eosinophils Relative: 0 %
HCT: 34.1 % — ABNORMAL LOW (ref 36.0–46.0)
Hemoglobin: 10.7 g/dL — ABNORMAL LOW (ref 12.0–15.0)
LYMPHS ABS: 1.3 10*3/uL (ref 0.7–4.0)
Lymphocytes Relative: 12 %
MCH: 28.4 pg (ref 26.0–34.0)
MCHC: 31.4 g/dL (ref 30.0–36.0)
MCV: 90.5 fL (ref 78.0–100.0)
Monocytes Absolute: 0.7 10*3/uL (ref 0.1–1.0)
Monocytes Relative: 7 %
NEUTROS PCT: 81 %
Neutro Abs: 9.1 10*3/uL — ABNORMAL HIGH (ref 1.7–7.7)
PLATELETS: 165 10*3/uL (ref 150–400)
RBC: 3.77 MIL/uL — AB (ref 3.87–5.11)
RDW: 16.1 % — ABNORMAL HIGH (ref 11.5–15.5)
WBC: 11.2 10*3/uL — ABNORMAL HIGH (ref 4.0–10.5)

## 2014-10-23 LAB — I-STAT TROPONIN, ED: TROPONIN I, POC: 0.14 ng/mL — AB (ref 0.00–0.08)

## 2014-10-23 LAB — BASIC METABOLIC PANEL
Anion gap: 16 — ABNORMAL HIGH (ref 5–15)
BUN: 31 mg/dL — AB (ref 6–20)
CHLORIDE: 98 mmol/L — AB (ref 101–111)
CO2: 24 mmol/L (ref 22–32)
Calcium: 8.6 mg/dL — ABNORMAL LOW (ref 8.9–10.3)
Creatinine, Ser: 6.87 mg/dL — ABNORMAL HIGH (ref 0.44–1.00)
GFR, EST AFRICAN AMERICAN: 6 mL/min — AB (ref >60)
GFR, EST NON AFRICAN AMERICAN: 6 mL/min — AB (ref >60)
Glucose, Bld: 162 mg/dL — ABNORMAL HIGH (ref 65–99)
POTASSIUM: 4.1 mmol/L (ref 3.5–5.1)
SODIUM: 138 mmol/L (ref 135–145)

## 2014-10-23 LAB — I-STAT VENOUS BLOOD GAS, ED
ACID-BASE EXCESS: 3 mmol/L — AB (ref 0.0–2.0)
BICARBONATE: 27.1 meq/L — AB (ref 20.0–24.0)
O2 Saturation: 96 %
PH VEN: 7.451 — AB (ref 7.250–7.300)
TCO2: 28 mmol/L (ref 0–100)
pCO2, Ven: 38.9 mmHg — ABNORMAL LOW (ref 45.0–50.0)
pO2, Ven: 81 mmHg — ABNORMAL HIGH (ref 30.0–45.0)

## 2014-10-23 LAB — GLUCOSE, CAPILLARY: Glucose-Capillary: 139 mg/dL — ABNORMAL HIGH (ref 65–99)

## 2014-10-23 LAB — BRAIN NATRIURETIC PEPTIDE: B NATRIURETIC PEPTIDE 5: 1300 pg/mL — AB (ref 0.0–100.0)

## 2014-10-23 MED ORDER — AMIODARONE HCL 200 MG PO TABS
100.0000 mg | ORAL_TABLET | Freq: Every day | ORAL | Status: DC
  Administered 2014-10-24 – 2014-10-29 (×5): 100 mg via ORAL
  Filled 2014-10-23 (×5): qty 1

## 2014-10-23 MED ORDER — HYDROCODONE-ACETAMINOPHEN 5-325 MG PO TABS: 1.0000 | ORAL_TABLET | ORAL | Status: DC | PRN

## 2014-10-23 MED ORDER — LIDOCAINE-PRILOCAINE 2.5-2.5 % EX CREA: 1.0000 "application " | TOPICAL_CREAM | CUTANEOUS | Status: DC | PRN

## 2014-10-23 MED ORDER — SODIUM CHLORIDE 0.9 % IV SOLN: 100.0000 mL | INTRAVENOUS | Status: DC | PRN

## 2014-10-23 MED ORDER — CALCIUM ACETATE 667 MG PO CAPS: 1334.0000 mg | ORAL_CAPSULE | ORAL | Status: DC

## 2014-10-23 MED ORDER — CALCIUM ACETATE (PHOS BINDER) 667 MG PO CAPS
2001.0000 mg | ORAL_CAPSULE | Freq: Three times a day (TID) | ORAL | Status: DC
  Administered 2014-10-24 – 2014-10-29 (×10): 2001 mg via ORAL
  Filled 2014-10-23 (×10): qty 3

## 2014-10-23 MED ORDER — LIDOCAINE HCL (PF) 1 % IJ SOLN: 5.0000 mL | INTRAMUSCULAR | Status: DC | PRN

## 2014-10-23 MED ORDER — PREDNISOLONE ACETATE 1 % OP SUSP
1.0000 [drp] | Freq: Three times a day (TID) | OPHTHALMIC | Status: DC
  Administered 2014-10-24 – 2014-10-29 (×15): 1 [drp] via OPHTHALMIC
  Filled 2014-10-23 (×3): qty 1

## 2014-10-23 MED ORDER — ONDANSETRON HCL 4 MG/2ML IJ SOLN
4.0000 mg | Freq: Four times a day (QID) | INTRAMUSCULAR | Status: DC | PRN
  Administered 2014-10-29: 06:00:00 4 mg via INTRAVENOUS
  Filled 2014-10-23: qty 2

## 2014-10-23 MED ORDER — ACETAMINOPHEN 500 MG PO TABS: 500.0000 mg | ORAL_TABLET | Freq: Four times a day (QID) | ORAL | Status: DC | PRN

## 2014-10-23 MED ORDER — ALTEPLASE 2 MG IJ SOLR
2.0000 mg | Freq: Once | INTRAMUSCULAR | Status: DC | PRN
  Filled 2014-10-23: qty 2

## 2014-10-23 MED ORDER — ENOXAPARIN SODIUM 30 MG/0.3ML ~~LOC~~ SOLN
30.0000 mg | Freq: Every day | SUBCUTANEOUS | Status: DC
  Administered 2014-10-24 – 2014-10-26 (×3): 30 mg via SUBCUTANEOUS
  Filled 2014-10-23 (×3): qty 0.3

## 2014-10-23 MED ORDER — ONDANSETRON HCL 4 MG PO TABS: 4.0000 mg | ORAL_TABLET | Freq: Four times a day (QID) | ORAL | Status: DC | PRN

## 2014-10-23 MED ORDER — SODIUM CHLORIDE 0.9 % IJ SOLN
3.0000 mL | Freq: Two times a day (BID) | INTRAMUSCULAR | Status: DC
  Administered 2014-10-24 – 2014-10-26 (×6): 3 mL via INTRAVENOUS

## 2014-10-23 MED ORDER — ASPIRIN 325 MG PO TABS
325.0000 mg | ORAL_TABLET | Freq: Every day | ORAL | Status: DC
  Administered 2014-10-24 – 2014-10-26 (×3): 325 mg via ORAL
  Filled 2014-10-23 (×4): qty 1

## 2014-10-23 MED ORDER — HEPARIN SODIUM (PORCINE) 1000 UNIT/ML DIALYSIS: 1000.0000 [IU] | INTRAMUSCULAR | Status: DC | PRN

## 2014-10-23 MED ORDER — CALCIUM ACETATE (PHOS BINDER) 667 MG PO CAPS: 1334.0000 mg | ORAL_CAPSULE | ORAL | Status: DC | PRN

## 2014-10-23 MED ORDER — MORPHINE SULFATE (PF) 2 MG/ML IV SOLN: 1.0000 mg | INTRAVENOUS | Status: DC | PRN

## 2014-10-23 MED ORDER — HEPARIN SODIUM (PORCINE) 1000 UNIT/ML DIALYSIS: 20.0000 [IU]/kg | INTRAMUSCULAR | Status: DC | PRN

## 2014-10-23 MED ORDER — DORZOLAMIDE HCL-TIMOLOL MAL 2-0.5 % OP SOLN
1.0000 [drp] | Freq: Three times a day (TID) | OPHTHALMIC | Status: DC
  Administered 2014-10-24 – 2014-10-29 (×14): 1 [drp] via OPHTHALMIC
  Filled 2014-10-23 (×4): qty 10

## 2014-10-23 MED ORDER — CALCITRIOL 0.5 MCG PO CAPS: 1.2500 ug | ORAL_CAPSULE | ORAL | Status: DC

## 2014-10-23 MED ORDER — SODIUM CHLORIDE 0.9 % IV SOLN: INTRAVENOUS | Status: DC

## 2014-10-23 MED ORDER — PENTAFLUOROPROP-TETRAFLUOROETH EX AERO: 1.0000 "application " | INHALATION_SPRAY | CUTANEOUS | Status: DC | PRN

## 2014-10-23 MED ORDER — MOMETASONE FURO-FORMOTEROL FUM 100-5 MCG/ACT IN AERO
2.0000 | INHALATION_SPRAY | Freq: Two times a day (BID) | RESPIRATORY_TRACT | Status: DC
  Administered 2014-10-24 – 2014-10-27 (×8): 2 via RESPIRATORY_TRACT
  Filled 2014-10-23 (×2): qty 8.8

## 2014-10-23 MED ORDER — SIMVASTATIN 20 MG PO TABS: 10.0000 mg | ORAL_TABLET | Freq: Every evening | ORAL | Status: DC

## 2014-10-23 MED ORDER — METOPROLOL TARTRATE 25 MG PO TABS
50.0000 mg | ORAL_TABLET | ORAL | Status: DC
  Administered 2014-10-24 – 2014-10-29 (×4): 50 mg via ORAL
  Filled 2014-10-23: qty 1
  Filled 2014-10-23: qty 2
  Filled 2014-10-23 (×2): qty 1

## 2014-10-23 NOTE — H&P (Signed)
[facility] History and Physical  CARRELL PALMATIER ESP:[id number] DOB: June 24, 1945 DOA: 10/23/2014  Referring physician: EDP PCP: Walker Kehr, MD   Chief Complaint: Shortness of breath  HPI: [name] is a 69 y.o. female with past medical history of ESRD, type 2 diabetes, chronic diastolic CHF, LBBB and history of colon cancer s/p colostomy. Patient came to the hospital because of shortness of breath. Patient reported that she was in her usual state of health until this morning when she started to have some shortness of breath, it's not associated with cough, no sputum, no fever or chills. Denies any chest pain. But she was short of breath even when she is resting so she came in to the hospital for further evaluation. In the ED findings consistent with a degree of CHF, oxygen saturation was 87% on room air, troponin slightly elevated at 0.14, patient admitted to the hospital for further evaluation.  Review of Systems:  Constitutional: negative for anorexia, fevers and sweats Eyes: negative for irritation, redness and visual disturbance Ears, nose, mouth, throat, and face: negative for earaches, epistaxis, nasal congestion and sore throat Respiratory: Per history of present illness Cardiovascular: negative for chest pain, dyspnea, lower extremity edema, orthopnea, palpitations and syncope Gastrointestinal: negative for abdominal pain, constipation, diarrhea, melena, nausea and vomiting Genitourinary:negative for dysuria, frequency and hematuria Hematologic/lymphatic: negative for bleeding, easy bruising and lymphadenopathy Musculoskeletal:negative for arthralgias, muscle weakness and stiff joints Neurological: negative for coordination problems, gait problems, headaches and weakness Endocrine: negative for diabetic symptoms including polydipsia, polyuria and weight loss Allergic/Immunologic: negative for anaphylaxis, hay fever and urticaria  Past Medical History  Diagnosis  Date  . Hyperlipidemia   . Mitral valve insufficiency and aortic valve insufficiency   . Cardiomyopathy- mixed   . 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 Chronic diastolic heart failure ([place])   . Anemia   . Coronary artery disease     Previously decreased EF; echo 113 normal LV function  . Hernia, incisional     abd  . History of colon cancer     history of colon cancer.  1986  . Arthritis   . LBBB (left bundle branch block)   . Atrial tachycardia ([place])     on amiod  . Hypertensive heart disease     sees Dr. Alain Marion  . Stroke [facility])     2011/12  . Obesity   . Hypertension   . Glaucoma   . Renal failure     ESRD, Dr Dunham/Dr. Lyda Kalata.  M, W, Fr  . GERD (gastroesophageal reflux disease)   . Cancer [facility])     colon cancer  . Colostomy in place [facility])    Past Surgical History  Procedure Laterality Date  . Colostomy  1986  . Revision urostomy cutaneous    . Esophagogastroduodenoscopy  03-18-04  . Electrocardiogram  04-27-06  . Placement of new left forearm arteriovenous graft  03-11-08  . Left heart catheterization and right heart catheterization  12-10    R. heart cath showed elevated left and right heart filling pressures w/ pulmonary artery pressure elevated mildly out of proportion to the wedge. The left heart cath showed diffuse distal vessel disease as well as a 75% stenosis in the mid circumflex w/ a 90% stenosis of the ostial first obtuse marginal. These lesions were in close proximity. there was a 60-70%mild RCA stenosis.   . Arteriovenous graft placement  2010  . Foot amputation  through metatarsal  10-07-10    Right foot transmetatarsal  . Cardiac catheterization    . Eye surgery      Cataract Left  . Pars plana vitrectomy  11/29/2011    Procedure: PARS PLANA VITRECTOMY WITH 23 GAUGE;  Surgeon: Adonis Brook, MD;  Location: [place];  Service: Ophthalmology;  Laterality: Right;  Right Eye 23 ga vitrectomy with membrane  peel  . Pars plana vitrectomy Left 02/28/2012    Procedure: PARS PLANA VITRECTOMY WITH 23 GAUGE;  Surgeon: Adonis Brook, MD;  Location: [place];  Service: Ophthalmology;  Laterality: Left;  . Esophagogastroduodenoscopy N/A 02/02/2013    Procedure: ESOPHAGOGASTRODUODENOSCOPY (EGD);  Surgeon: Irene Shipper, MD;  Location: [facility] ENDOSCOPY;  Service: Endoscopy;  Laterality: N/A;  . Vaginal hysterectomy    . Colonoscopy    . Insertion of ahmed valve Right 08/20/2013    Procedure: INSERTION OF AHMED VALVE WITH Mitomycin C application;  Surgeon: Marylynn Pearson, MD;  Location: [place];  Service: Ophthalmology;  Laterality: Right;  . Insertion of ahmed valve Left 07/22/2014    Procedure: INSERTION OF AHMED VALVE WITH [place];  Surgeon: Marylynn Pearson, MD;  Location: [place];  Service: Ophthalmology;  Laterality: Left;  [name] Mitomycin c application Left 2/40/9735    Procedure: MITOMYCIN C APPLICATION;  Surgeon: Marylynn Pearson, MD;  Location: [place];  Service: Ophthalmology;  Laterality: Left;   Social History:   reports that she has never smoked. She has never used smokeless tobacco. She reports that she does not drink alcohol or use illicit drugs.  Allergies  Allergen Reactions  . Ace Inhibitors Cough  . Eggs Or Egg-Derived Products Nausea And Vomiting  . Enalapril Cough  . Lisinopril Cough  . Omnipaque [Iohexol] Hives    Family History  Problem Relation Age of Onset  . Cancer Sister     colon  . Hypertension Other   . Hypertension Mother   . Coronary artery disease Mother   . Hypertension Father   . Diabetes Father      Prior to Admission medications   Medication Sig Start Date End Date Taking? Authorizing Provider  acetaminophen (TYLENOL) 500 MG tablet Take 500 mg by mouth every 6 (six) hours as needed (pain).   Yes Historical Provider, MD  amiodarone (PACERONE) 200 MG tablet Take 0.5 tablets (100 mg total) by mouth daily. 02/24/14  Yes Deboraha Sprang, MD  aspirin 325 MG tablet Take 325 mg by mouth  daily.    Yes Historical Provider, MD  aspirin 325 MG tablet Take 325 mg by mouth every 6 (six) hours as needed for headache.   Yes Historical Provider, MD  calcium acetate (PHOSLO) 667 MG capsule Take 1,334-2,001 mg by mouth See admin instructions. Take 2001mg  three times daily with meals and Take 1334mg   with snacks.   Yes Historical Provider, MD  dorzolamide-timolol (COSOPT) 22.3-6.8 MG/ML ophthalmic solution Place 1 drop into the left eye 4 (four) times daily. 09/17/14  Yes Historical Provider, MD  Fluticasone-Salmeterol (ADVAIR) 250-50 MCG/DOSE AEPB Inhale 1 puff into the lungs daily as needed (shortness of breath).  05/28/12  Yes Aleksei Plotnikov V, MD  metoprolol (LOPRESSOR) 50 MG tablet Take 50 mg by mouth 4 (four) times a week. Take 50mg  once a day on Tuesday, Thursday, Saturday, and Sunday. Skipping Dialysis days.   Yes Historical Provider, MD  prednisoLONE acetate (PRED FORTE) 1 % ophthalmic suspension Place 1 drop into the left eye 4 (four) times daily. 10/22/14  Yes Historical Provider, MD  repaglinide (PRANDIN) 0.5 MG tablet Take 1 tablet (0.5 mg total) by mouth 3 (three) times daily before meals. 04/16/14  Yes Renato Shin, MD  simvastatin (ZOCOR) 10 MG tablet Take 10 mg by mouth every evening. 01/30/13  Yes Deboraha Sprang, MD  glucose blood (ONE TOUCH ULTRA TEST) test strip Check blood sugar 2 time per day. DX code E11.9 06/02/14   Renato Shin, MD   Physical Exam: Filed Vitals:   10/23/14 1615  BP: 146/82  Pulse: 121  Temp:   Resp: 23   Constitutional: Oriented to person, place, and time. Well-developed and well-nourished. Cooperative.  Head: Normocephalic and atraumatic.  Nose: Nose normal.  Mouth/Throat: Uvula is midline, oropharynx is clear and moist and mucous membranes are normal.  Eyes: Conjunctivae and EOM are normal. Pupils are equal, round, and reactive to light.  Neck: Trachea normal and normal range of motion. Neck supple.  Cardiovascular: Normal rate, regular rhythm,  S1 normal, S2 normal, normal heart sounds and intact distal pulses.   Pulmonary/Chest: Effort normal and breath sounds normal.  Abdominal: Soft. Bowel sounds are normal. There is no hepatosplenomegaly. There is no tenderness.  Musculoskeletal: Normal range of motion.  Neurological: Alert and oriented to person, place, and time. Has normal strength. No cranial nerve deficit or sensory deficit.  Skin: Skin is warm, dry and intact.  Psychiatric: Has a normal mood and affect. Speech is normal and behavior is normal.   Labs on Admission:  Basic Metabolic Panel:  Recent Labs Lab 10/23/14 1327  NA 138  K 4.1  CL 98*  CO2 24  GLUCOSE 162*  BUN 31*  CREATININE 6.87*  CALCIUM 8.6*   Liver Function Tests: No results for input(s): AST, ALT, ALKPHOS, BILITOT, PROT, ALBUMIN in the last 168 hours. No results for input(s): LIPASE, AMYLASE in the last 168 hours. No results for input(s): AMMONIA in the last 168 hours. CBC:  Recent Labs Lab 10/23/14 1327  WBC 11.2*  NEUTROABS 9.1*  HGB 10.7*  HCT 34.1*  MCV 90.5  PLT 165   Cardiac Enzymes: No results for input(s): CKTOTAL, CKMB, CKMBINDEX, TROPONINI in the last 168 hours.  BNP (last 3 results)  Recent Labs  10/23/14 1327  BNP 1300.0*    ProBNP (last 3 results) No results for input(s): PROBNP in the last 8760 hours.  CBG: No results for input(s): GLUCAP in the last 168 hours.  Radiological Exams on Admission: Dg Chest 2 View  10/23/2014  CLINICAL DATA:  Shortness of Breath EXAM: CHEST  2 VIEW COMPARISON:  September 22, 2013 FINDINGS: There is mild generalized interstitial edema. There is cardiomegaly with pulmonary venous hypertension. No airspace consolidation. There is a minimal right pleural effusion. No adenopathy. No bone lesions. There is atherosclerotic change in the aorta. IMPRESSION: Findings consistent with a degree of congestive heart failure. No airspace consolidation. Electronically Signed   By: Lowella Grip  III M.D.   On: 10/23/2014 12:55    EKG: Independently reviewed. Rate at 107, left bundle branch block, no changes from previous EKG, this is old.  Assessment/Plan Principal Problem:   Acute respiratory failure with hypoxia (HCC) Active Problems:   LBBB (left bundle branch block)   ESRD on dialysis ([place])   Shortness of breath   IDDM (insulin dependent diabetes mellitus) (HCC)   Elevated troponin   Congestive heart failure (CHF) ([place])    Acute hypoxic respiratory failure Patient presented to the hospital with shortness of breath, CXR finding of mild pulmonary edema and O2  sats of 87% on RA. Findings are secondary to congestive heart failure, likely acute on chronic diastolic.[name] Placed initially on O2 via nasal cannula, still short of breath, placed on BiPAP. Patient with dialysis later today.  Congestive heart failure Patient has history of diastolic congestive heart failure, chest x-ray showed pulmonary edema. 2-D echo to be done, last echo was in January 2015. Showed LVEF of 55-60%  ESRD Dialysis days Monday, Wednesday and Friday, with evidence of pulmonary edema on x-ray. Nephrology already notified and so the patient, for dialysis later today.  Elevated troponin Troponin slightly elevated at 0.14, denies chest pain, this is likely secondary to the CHF. Will follow the trend to rule out ACS, this is probably secondary to the CHF and ESRD. Patient is on aspirin and beta blockers, continue. 2-D echo to rule out regional WMA.  Diabetes mellitus type 2 Last A1c 7.4 from January 2016, patient is been on Prandin, hold. Carbohydrate modified diet and insulin sliding scale, check A1c again.  LBBB Has old left bundle branch block on her EKG, no changes since last study.   Code Status:  full code Family Communication:  Plan discussed with the patient  Disposition Plan:  on BiPAP for step down, reevaluate after dialysis for telemetry.   Time spent: 70 minutes  Chasmine Lender A,  MD [facility] Pager [phone]

## 2014-10-23 NOTE — ED Notes (Signed)
Per EMS pt from to be evaluated for worsening shortness of breath since this morning. Patient endorses dyspnea with rest and exertion. Upon EMS arrival patient was placed on 6L of O2 [place]- was tripoding with pursed lip breathing at a rate of 28-20 breaths per minute. En route patient breathing slowed but still endorses shortness of breath. Patient lung sounds clear throughout.   Patient is due for dialysis today.

## 2014-10-23 NOTE — Progress Notes (Signed)
Patient transported on BiPAP to HD by RT and RN. Vitals stable.

## 2014-10-23 NOTE — ED Provider Notes (Signed)
CSN: [id number]     Arrival date & time 10/23/14  1150 History   First MD Initiated Contact with Patient 10/23/14 1206     Chief Complaint  Patient presents with  . Shortness of Breath     (Consider location/radiation/quality/duration/timing/severity/associated sxs/prior Treatment) HPI   [name] is a 69 y.o. 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  Per EMS, pt endorses DOE and rest.  Upon EMS arrival, pt was tripoding with pursed lip breathing with a respiratory rate of 20-28 breaths per minute.  She was placed on 6L [place] and breathing rate slowed somewhat.  Patient states that her shortness of breath is somewhat improved.  She is on 2L [place] here in the ED sats at 100%.  She also endorses cough and chest discomfort.  Denies fevers, chills, sore throat, N/V/D, abdominal pain, hematuria, increased urinary frequency, or dysuria.  However, patient states she thinks she has a UTI.  Nothing like this has happened in the past.   Past Medical History  Diagnosis Date  . Hyperlipidemia   . Mitral valve insufficiency and aortic valve insufficiency   . Cardiomyopathy- mixed   . 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 Chronic diastolic heart failure ([place])   . Anemia   . Coronary artery disease     Previously decreased EF; echo 113 normal LV function  . Hernia, incisional     abd  . History of colon cancer     history of colon cancer.  1986  . Arthritis   . LBBB (left bundle branch block)   . Atrial tachycardia ([place])     on amiod  . Hypertensive heart disease     sees Dr. Alain Marion  . Stroke [facility])     2011/12  . Obesity   . Hypertension   . Glaucoma   . Renal failure     ESRD, Dr Dunham/Dr. Lyda Kalata.  M, W, Fr  . GERD (gastroesophageal reflux disease)   . Cancer [facility])     colon cancer  . Colostomy in place [facility])    Past  Surgical History  Procedure Laterality Date  . Colostomy  1986  . Revision urostomy cutaneous    . Esophagogastroduodenoscopy  03-18-04  . Electrocardiogram  04-27-06  . Placement of new left forearm arteriovenous graft  03-11-08  . Left heart catheterization and right heart catheterization  12-10    R. heart cath showed elevated left and right heart filling pressures w/ pulmonary artery pressure elevated mildly out of proportion to the wedge. The left heart cath showed diffuse distal vessel disease as well as a 75% stenosis in the mid circumflex w/ a 90% stenosis of the ostial first obtuse marginal. These lesions were in close proximity. there was a 60-70%mild RCA stenosis.   . Arteriovenous graft placement  2010  . 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 Cardiac catheterization    . Eye surgery      Cataract Left  . Pars plana vitrectomy  11/29/2011    Procedure: PARS PLANA VITRECTOMY WITH 23 GAUGE;  Surgeon: Adonis Brook, MD;  Location: [place];  Service: Ophthalmology;  Laterality: Right;  Right Eye 23 ga vitrectomy with membrane peel  . Pars plana vitrectomy Left 02/28/2012    Procedure: PARS PLANA VITRECTOMY WITH 23 GAUGE;  Surgeon: Adonis Brook, MD;  Location: [place];  Service: Ophthalmology;  Laterality: Left;  . Esophagogastroduodenoscopy N/A 02/02/2013    Procedure: ESOPHAGOGASTRODUODENOSCOPY (EGD);  Surgeon: Irene Shipper, MD;  Location: [facility] ENDOSCOPY;  Service: Endoscopy;  Laterality: N/A;  . Vaginal hysterectomy    . Colonoscopy    . Insertion of ahmed valve Right 08/20/2013    Procedure: INSERTION OF AHMED VALVE WITH Mitomycin C application;  Surgeon: Marylynn Pearson, MD;  Location: [place];  Service: Ophthalmology;  Laterality: Right;  . Insertion of ahmed valve Left 07/22/2014    Procedure: INSERTION OF AHMED VALVE WITH [place];  Surgeon: Marylynn Pearson, MD;  Location: [place];  Service: Ophthalmology;  Laterality: Left;  [name] Mitomycin c application Left  9/92/4268    Procedure: MITOMYCIN C APPLICATION;  Surgeon: Marylynn Pearson, MD;  Location: [place];  Service: Ophthalmology;  Laterality: Left;   Family History  Problem Relation Age of Onset  . Cancer Sister     colon  . Hypertension Other   . Hypertension Mother   . Coronary artery disease Mother   . Hypertension Father   . Diabetes Father    Social History  Substance Use Topics  . Smoking status: Never Smoker   . Smokeless tobacco: Never Used  . Alcohol Use: No   OB History    No data available     Review of Systems  All other systems negative unless otherwise stated in HPI   Allergies  Ace inhibitors; Eggs or egg-derived products; Enalapril; Lisinopril; and Omnipaque  Home Medications   Prior to Admission medications   Medication Sig Start Date End Date Taking? Authorizing Provider  acetaminophen (TYLENOL) 500 MG tablet Take 500 mg by mouth every 6 (six) hours as needed (pain).   Yes Historical Provider, MD  amiodarone (PACERONE) 200 MG tablet Take 0.5 tablets (100 mg total) by mouth daily. 02/24/14  Yes Deboraha Sprang, MD  aspirin 325 MG tablet Take 325 mg by mouth daily.    Yes Historical Provider, MD  aspirin 325 MG tablet Take 325 mg by mouth every 6 (six) hours as needed for headache.   Yes Historical Provider, MD  calcium acetate (PHOSLO) 667 MG capsule Take 1,334-2,001 mg by mouth See admin instructions. Take 2001mg  three times daily with meals and Take 1334mg   with snacks.   Yes Historical Provider, MD  dorzolamide-timolol (COSOPT) 22.3-6.8 MG/ML ophthalmic solution Place 1 drop into the left eye 4 (four) times daily. 09/17/14  Yes Historical Provider, MD  Fluticasone-Salmeterol (ADVAIR) 250-50 MCG/DOSE AEPB Inhale 1 puff into the lungs daily as needed (shortness of breath).  05/28/12  Yes Aleksei Plotnikov V, MD  metoprolol (LOPRESSOR) 50 MG tablet Take 50 mg by mouth 4 (four) times a week. Take 50mg  once a day on Tuesday, Thursday, Saturday, and Sunday. Skipping  Dialysis days.   Yes Historical Provider, MD  prednisoLONE acetate (PRED FORTE) 1 % ophthalmic suspension Place 1 drop into the left eye 4 (four) times daily. 10/22/14  Yes Historical Provider, MD  repaglinide (PRANDIN) 0.5 MG tablet Take 1 tablet (0.5 mg total) by mouth 3 (three) times daily before meals. 04/16/14  Yes Renato Shin, MD  simvastatin (ZOCOR) 10 MG tablet Take 10 mg by mouth every evening. 01/30/13  Yes Deboraha Sprang, MD  glucose blood (ONE TOUCH ULTRA TEST) test strip Check blood sugar 2 time per day. DX code E11.9 06/02/14   Renato Shin, MD   BP 154/41 mmHg  Pulse 120  Temp(Src)  98.6 F (37 C) (Oral)  Resp 26  SpO2 100% Physical Exam  Constitutional: She is oriented to person, place, and time. She appears well-developed and well-nourished.  HENT:  Head: Normocephalic and atraumatic.  Mouth/Throat: Oropharynx is clear and moist.  Eyes: Pupils are equal, round, and reactive to light.  Neck: Normal range of motion. Neck supple. No tracheal deviation present.  Cardiovascular: Normal rate, regular rhythm, normal heart sounds and intact distal pulses.   No murmur heard. No lower extremity edema.  Pulmonary/Chest: Effort normal. No accessory muscle usage or stridor. Tachypnea noted. No respiratory distress. She has decreased breath sounds. She has no wheezes. She has no rales.  Abdominal: Soft. Bowel sounds are normal. She exhibits no distension. There is no tenderness.  Musculoskeletal: Normal range of motion.  Lymphadenopathy:    She has no cervical adenopathy.  Neurological: She is alert and oriented to person, place, and time.  Skin: Skin is warm and dry.  Psychiatric: She has a normal mood and affect. Her behavior is normal.    ED Course  Procedures (including critical care time) Labs Review Labs Reviewed  CBC WITH DIFFERENTIAL/PLATELET - Abnormal; Notable for the following:    WBC 11.2 (*)    RBC 3.77 (*)    Hemoglobin 10.7 (*)    HCT 34.1 (*)    RDW 16.1 (*)     Neutro Abs 9.1 (*)    All other components within normal limits  BASIC METABOLIC PANEL - Abnormal; Notable for the following:    Chloride 98 (*)    Glucose, Bld 162 (*)    BUN 31 (*)    Creatinine, Ser 6.87 (*)    Calcium 8.6 (*)    GFR calc non Af Amer 6 (*)    GFR calc Af Amer 6 (*)    Anion gap 16 (*)    All other components within normal limits  BRAIN NATRIURETIC PEPTIDE - Abnormal; Notable for the following:    B Natriuretic Peptide 1300.0 (*)    All other components within normal limits  I-STAT TROPOININ, ED - Abnormal; Notable for the following:    Troponin i, poc 0.14 (*)    All other components within normal limits  I-STAT VENOUS BLOOD GAS, ED - Abnormal; Notable for the following:    pH, Ven 7.451 (*)    pCO2, Ven 38.9 (*)    pO2, Ven 81.0 (*)    Bicarbonate 27.1 (*)    Acid-Base Excess 3.0 (*)    All other components within normal limits  BLOOD GAS, VENOUS    Imaging Review Dg Chest 2 View  10/23/2014  CLINICAL DATA:  Shortness of Breath EXAM: CHEST  2 VIEW COMPARISON:  September 22, 2013 FINDINGS: There is mild generalized interstitial edema. There is cardiomegaly with pulmonary venous hypertension. No airspace consolidation. There is a minimal right pleural effusion. No adenopathy. No bone lesions. There is atherosclerotic change in the aorta. IMPRESSION: Findings consistent with a degree of congestive heart failure. No airspace consolidation. Electronically Signed   By: Lowella Grip III M.D.   On: 10/23/2014 12:55   I have personally reviewed and evaluated these images and lab results as part of my medical decision-making.   EKG Interpretation   Date/Time:  Friday October 23 2014 12:05:42 EDT Ventricular Rate:  107 PR Interval:    QRS Duration: 166 QT Interval:  425 QTC Calculation: 567 R Axis:   -83 Text Interpretation:  Sinus rhythm with first degree AV block Left bundle  branch block No significant change since last tracing Confirmed by KNOTT   MD, DANIEL [id number]) on 10/23/2014 12:41:18 PM      MDM   Final diagnoses:  Shortness of breath    Dialysis patient presents with sudden shortness of breath, DOE, and chest discomfort.  VS show tachycardia, tachypneia, pt is afebrile.  On exam, lung sounds are diminished (most likely due to body habitus).  No wheezing, crackles, or rhonchi.  Pt sats 100% 2L [place].  No stridor.  Able to speak in full sentences.  Concern for fluid overload/pulmonary edema vs pleural effusion vs PTX vs PE vs pericardial effusion.  Will obtain troponin, CBC, BMP, BNP, VBG as well as CXR.  -CXR shows generalized interstitial edema and findings consistent with degree of CHF.  No airspace consolidation.  Suspect pulmonary edema.  Doubt pleural effusion.  Doubt PTX.  Doubt PE.  Doubt pericardial effusion.  -EKG shows tachycardia with NSR with no changes from previous. -Troponin 0.14 -VBG patient is not acidotic -CBC shows mild leukocytosis of 11.2 -BMP shows no metabolic abnormalities off of patient's baseline  3:57 PM: Will admit for shortness of breath and pulmonary edema to medicine.  Nephrology has been consulted, and is aware of the patient.   4:10 PM: patient having labored breathing, but not hypoxic.  Will start Bipap.    4:40 PM: Hospitalist made aware, and patient will go to dialysis now.  Gloriann Loan, PA-C 10/23/14 1639  Leo Grosser, MD 10/25/14 [phone]

## 2014-10-23 NOTE — Consult Note (Signed)
[facility] Renal Consultation Note    Indication for Consultation:  Management of ESRD/hemodialysis; anemia, hypertension/volume and secondary hyperparathyroidism PCP:  [place]  HPI: [name] is a 69 y.o. female with ESRD secondary to DM/HTN on HD since November 2010 with hx morbid obesity, atrial tachycardia on amiodarone, colon cancer with bladder involvement with colostomy/urostomy, right transmet NSTEMI 2010, CVA 2012 EF 55-60% January 2015 Echo - remotely was much lower.  Pt presented to the ED today with SOB and report of pain in right arm "non my dialysis arm".   Per ED note she had increased respiration at 20 - 28 , was placed on 6 L O2 and had some improvement.  Now on 2 L with 100% sats. Vague about symptoms - just didn't feel right. Not CP or chest pressure per se, but has some cough. She denies denies N, V, D fever or chills. Also concerned she has a UTI.  At present she is eating a [employer] sandwich and breathing without difficulty. She is due for dialysis today.  Possibly just anxiety responsible for symptoms at start ? Seems better now  Past Medical History  Diagnosis Date  . Hyperlipidemia   . Mitral valve insufficiency and aortic valve insufficiency   . Cardiomyopathy- mixed   . 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 Chronic diastolic heart failure ([place])   . Anemia   . Coronary artery disease     Previously decreased EF; echo 113 normal LV function  . Hernia, incisional     abd  . History of colon cancer     history of colon cancer.  1986  . Arthritis   . LBBB (left bundle branch block)   . Atrial tachycardia ([place])     on amiod  . Hypertensive heart disease     sees Dr. Alain Marion  . Stroke [facility])     2011/12  . Obesity   . Hypertension   . Glaucoma   . Renal failure     ESRD, Dr Dunham/Dr. Lyda Kalata.  M, W, Fr  . GERD (gastroesophageal reflux disease)   . Cancer [facility])     colon  cancer  . Colostomy in place [facility])    Past Surgical History  Procedure Laterality Date  . Colostomy  1986  . Revision urostomy cutaneous    . Esophagogastroduodenoscopy  03-18-04  . Electrocardiogram  04-27-06  . Placement of new left forearm arteriovenous graft  03-11-08  . Left heart catheterization and right heart catheterization  12-10    R. heart cath showed elevated left and right heart filling pressures w/ pulmonary artery pressure elevated mildly out of proportion to the wedge. The left heart cath showed diffuse distal vessel disease as well as a 75% stenosis in the mid circumflex w/ a 90% stenosis of the ostial first obtuse marginal. These lesions were in close proximity. there was a 60-70%mild RCA stenosis.   . Arteriovenous graft placement  2010  . 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 Cardiac catheterization    . Eye surgery      Cataract Left  . Pars plana vitrectomy  11/29/2011    Procedure: PARS PLANA VITRECTOMY WITH 23 GAUGE;  Surgeon: Adonis Brook, MD;  Location: [place];  Service: Ophthalmology;  Laterality: Right;  Right Eye 23 ga vitrectomy with membrane peel  . Pars plana vitrectomy Left 02/28/2012    Procedure: PARS  PLANA VITRECTOMY WITH 23 GAUGE;  Surgeon: Adonis Brook, MD;  Location: [place];  Service: Ophthalmology;  Laterality: Left;  . Esophagogastroduodenoscopy N/A 02/02/2013    Procedure: ESOPHAGOGASTRODUODENOSCOPY (EGD);  Surgeon: Irene Shipper, MD;  Location: [facility] ENDOSCOPY;  Service: Endoscopy;  Laterality: N/A;  . Vaginal hysterectomy    . Colonoscopy    . Insertion of ahmed valve Right 08/20/2013    Procedure: INSERTION OF AHMED VALVE WITH Mitomycin C application;  Surgeon: Marylynn Pearson, MD;  Location: [place];  Service: Ophthalmology;  Laterality: Right;  . Insertion of ahmed valve Left 07/22/2014    Procedure: INSERTION OF AHMED VALVE WITH [place];  Surgeon: Marylynn Pearson, MD;  Location: [place];  Service: Ophthalmology;   Laterality: Left;  [name] Mitomycin c application Left 5/36/1443    Procedure: MITOMYCIN C APPLICATION;  Surgeon: Marylynn Pearson, MD;  Location: [place];  Service: Ophthalmology;  Laterality: Left;   Family History  Problem Relation Age of Onset  . Cancer Sister     colon  . Hypertension Other   . Hypertension Mother   . Coronary artery disease Mother   . Hypertension Father   . Diabetes Father    Social History:  reports that she has never smoked. She has never used smokeless tobacco. She reports that she does not drink alcohol or use illicit drugs. Allergies  Allergen Reactions  . Ace Inhibitors Cough  . Eggs Or Egg-Derived Products Nausea And Vomiting  . Enalapril Cough  . Lisinopril Cough  . Omnipaque [Iohexol] Hives   Prior to Admission medications   Medication Sig Start Date End Date Taking? Authorizing Provider  acetaminophen (TYLENOL) 500 MG tablet Take 500 mg by mouth every 6 (six) hours as needed (pain).   Yes Historical Provider, MD  amiodarone (PACERONE) 200 MG tablet Take 0.5 tablets (100 mg total) by mouth daily. 02/24/14  Yes Deboraha Sprang, MD  aspirin 325 MG tablet Take 325 mg by mouth daily.    Yes Historical Provider, MD  aspirin 325 MG tablet Take 325 mg by mouth every 6 (six) hours as needed for headache.   Yes Historical Provider, MD  calcium acetate (PHOSLO) 667 MG capsule Take 1,334-2,001 mg by mouth See admin instructions. Take 2001mg  three times daily with meals and Take 1334mg   with snacks.   Yes Historical Provider, MD  dorzolamide-timolol (COSOPT) 22.3-6.8 MG/ML ophthalmic solution Place 1 drop into the left eye 4 (four) times daily. 09/17/14  Yes Historical Provider, MD  Fluticasone-Salmeterol (ADVAIR) 250-50 MCG/DOSE AEPB Inhale 1 puff into the lungs daily as needed (shortness of breath).  05/28/12  Yes Aleksei Plotnikov V, MD  metoprolol (LOPRESSOR) 50 MG tablet Take 50 mg by mouth 4 (four) times a week. Take 50mg  once a day on Tuesday, Thursday, Saturday, and  Sunday. Skipping Dialysis days.   Yes Historical Provider, MD  prednisoLONE acetate (PRED FORTE) 1 % ophthalmic suspension Place 1 drop into the left eye 4 (four) times daily. 10/22/14  Yes Historical Provider, MD  repaglinide (PRANDIN) 0.5 MG tablet Take 1 tablet (0.5 mg total) by mouth 3 (three) times daily before meals. 04/16/14  Yes Renato Shin, MD  simvastatin (ZOCOR) 10 MG tablet Take 10 mg by mouth every evening. 01/30/13  Yes Deboraha Sprang, MD  glucose blood (ONE TOUCH ULTRA TEST) test strip Check blood sugar 2 time per day. DX code E11.9 06/02/14   Renato Shin, MD   No current facility-administered medications for this encounter.   Current Outpatient Prescriptions  Medication Sig Dispense Refill  . acetaminophen (TYLENOL) 500 MG tablet Take 500 mg by mouth every 6 (six) hours as needed (pain).    [name] amiodarone (PACERONE) 200 MG tablet Take 0.5 tablets (100 mg total) by mouth daily. 45 tablet 6  . aspirin 325 MG tablet Take 325 mg by mouth daily.     [name] aspirin 325 MG tablet Take 325 mg by mouth every 6 (six) hours as needed for headache.    . calcium acetate (PHOSLO) 667 MG capsule Take 1,334-2,001 mg by mouth See admin instructions. Take 2001mg  three times daily with meals and Take 1334mg   with snacks.    . dorzolamide-timolol (COSOPT) 22.3-6.8 MG/ML ophthalmic solution Place 1 drop into the left eye 4 (four) times daily.  12  . Fluticasone-Salmeterol (ADVAIR) 250-50 MCG/DOSE AEPB Inhale 1 puff into the lungs daily as needed (shortness of breath).     . metoprolol (LOPRESSOR) 50 MG tablet Take 50 mg by mouth 4 (four) times a week. Take 50mg  once a day on Tuesday, Thursday, Saturday, and Sunday. Skipping Dialysis days.    . prednisoLONE acetate (PRED FORTE) 1 % ophthalmic suspension Place 1 drop into the left eye 4 (four) times daily.    . repaglinide (PRANDIN) 0.5 MG tablet Take 1 tablet (0.5 mg total) by mouth 3 (three) times daily before meals. 90 tablet 11  . simvastatin (ZOCOR) 10 MG  tablet Take 10 mg by mouth every evening.    [name] glucose blood (ONE TOUCH ULTRA TEST) test strip Check blood sugar 2 time per day. DX code E11.9 200 each 2   Facility-Administered Medications Ordered in Other Encounters  Medication Dose Route Frequency Provider Last Rate Last Dose  . mitoMYcin (MUTAMYCIN) Injection Use in OR only (0.4 mg/ml)  0.5 mL Left Eye Once Marylynn Pearson, MD       Labs: Basic Metabolic Panel:  Recent Labs Lab 10/23/14 1327  NA 138  K 4.1  CL 98*  CO2 24  GLUCOSE 162*  BUN 31*  CREATININE 6.87*  CALCIUM 8.6*    Recent Labs Lab 10/23/14 1327  WBC 11.2*  NEUTROABS 9.1*  HGB 10.7*  HCT 34.1*  MCV 90.5  PLT 165  Studies/Results: Dg Chest 2 View  10/23/2014  CLINICAL DATA:  Shortness of Breath EXAM: CHEST  2 VIEW COMPARISON:  September 22, 2013 FINDINGS: There is mild generalized interstitial edema. There is cardiomegaly with pulmonary venous hypertension. No airspace consolidation. There is a minimal right pleural effusion. No adenopathy. No bone lesions. There is atherosclerotic change in the aorta. IMPRESSION: Findings consistent with a degree of congestive heart failure. No airspace consolidation. Electronically Signed   By: Lowella Grip III M.D.   On: 10/23/2014 12:55    ROS: As per HPI otherwise negative.  Physical Exam: Filed Vitals:   10/23/14 1318 10/23/14 1320 10/23/14 1330 10/23/14 1445  BP:      Pulse: 102 102 102 118  Temp:      TempSrc:      Resp: 19 22 21 25   SpO2: 100% 100% 100% 99%     General: Well developed, obese eating Sub[employer] sandwich in no acute distress., daughter at bedside Head: Normocephalic, atraumatic, sclera non-icteric, mucus membranes are moist Neck: Supple. JVD not elevated. Lungs: Diminished BS poor expansion; weak sitting up. Breathing is unlabored. No overt crackles; exam limited by body habitus Heart: RR tachy rate low 100s Abdomen: Obese Soft, non-tender, non-distended with ostomy  M-S:  Strength and tone  appear normal  for age. Lower extremities: without edema no open wounds ; right transmet well healed Neuro: Alert and oriented X 3. Moves all extremities spontaneously. Psych:  Responds to questions appropriately with a normal affect. Dialysis Access: left lower AVF + bruit  Dialysis Orders: [facility] 160 4 hr 400/A1.5 left upper AVF heparin 3000 profile 4 3 K 2.25 Ca EDW 91 (recently lowered from 92 and leaving at 90.9 and 90.5 with modest goals) Mircera 75 q 2 weeks - given 10/12 no Fe calcitriol 1.25 Recent labs:  Hgb 11.4 10/12 27% sat in Sept iPTH 223 K 3.7 - 3.9 on 3 K bath Ca ok P +/- 5.5 on 4 phoslo ac  Assessment/Plan: 1. SOB with mild volume excess on xray- appears to be losing weight as evidenced by leaving below EDW after EDW lowered; sats ok on room air; appears EDW needs to be lowered further- yes I agree Initial Trop I 0.14 - BNP 1300 2. ESRD -  MWF - due for HD today - K 4.1 - use 3 K bath 3. Hypertension/volume  - BP lower than usual - outpt BPs run 150 - 160s lately 4. Anemia  - Hgb 10.7 - had Mircera 75 10/12 - no ESA due until 10/26 5. Metabolic bone disease -  Ca 8.6 - redose calcitriol 1.25 today- on 4 phoslo ac tid 6. Nutrition - needs renal diet/vit 7. Diabetes - [per primary 8. Atrial tachy cardia - on BB on non HD days and amiodarone - followed by Dr. Mathis Bud, PA-C [place] [phone] 10/23/2014, 3:22 PM   Patient seen and examined, agree with above note with above modifications. 69 year old well known to me.  Is usually compliant with HD- looks like has been losing weight so it might be that all she needs is a lower EDW- cycle enzymes and see if improves after HD  Corliss Parish, MD 10/23/2014

## 2014-10-23 NOTE — ED Notes (Signed)
Unable to obtain IV access, IV team order to be placed

## 2014-10-23 NOTE — ED Notes (Signed)
IV team at bedside 

## 2014-10-24 DIAGNOSIS — R7989 Other specified abnormal findings of blood chemistry: Secondary | ICD-10-CM

## 2014-10-24 DIAGNOSIS — J9601 Acute respiratory failure with hypoxia: Secondary | ICD-10-CM

## 2014-10-24 LAB — CBC
HCT: 34.4 % — ABNORMAL LOW (ref 36.0–46.0)
Hemoglobin: 10.6 g/dL — ABNORMAL LOW (ref 12.0–15.0)
MCH: 28 pg (ref 26.0–34.0)
MCHC: 30.8 g/dL (ref 30.0–36.0)
MCV: 90.8 fL (ref 78.0–100.0)
PLATELETS: 226 10*3/uL (ref 150–400)
RBC: 3.79 MIL/uL — ABNORMAL LOW (ref 3.87–5.11)
RDW: 16.3 % — AB (ref 11.5–15.5)
WBC: 7.7 10*3/uL (ref 4.0–10.5)

## 2014-10-24 LAB — PROTIME-INR
INR: 1.19 (ref 0.00–1.49)
PROTHROMBIN TIME: 15.3 s — AB (ref 11.6–15.2)

## 2014-10-24 LAB — BASIC METABOLIC PANEL
Anion gap: 13 (ref 5–15)
BUN: 14 mg/dL (ref 6–20)
CHLORIDE: 98 mmol/L — AB (ref 101–111)
CO2: 29 mmol/L (ref 22–32)
CREATININE: 4.1 mg/dL — AB (ref 0.44–1.00)
Calcium: 8.8 mg/dL — ABNORMAL LOW (ref 8.9–10.3)
GFR calc Af Amer: 12 mL/min — ABNORMAL LOW (ref >60)
GFR calc non Af Amer: 10 mL/min — ABNORMAL LOW (ref >60)
GLUCOSE: 132 mg/dL — AB (ref 65–99)
Potassium: 3.3 mmol/L — ABNORMAL LOW (ref 3.5–5.1)
Sodium: 140 mmol/L (ref 135–145)

## 2014-10-24 LAB — GLUCOSE, CAPILLARY
GLUCOSE-CAPILLARY: 163 mg/dL — AB (ref 65–99)
Glucose-Capillary: 132 mg/dL — ABNORMAL HIGH (ref 65–99)

## 2014-10-24 LAB — TSH: TSH: 0.83 u[IU]/mL (ref 0.350–4.500)

## 2014-10-24 LAB — TROPONIN I
Troponin I: 1.9 ng/mL (ref <0.031)
Troponin I: 3.07 ng/mL (ref <0.031)
Troponin I: 2.92 ng/mL (ref <0.031)

## 2014-10-24 LAB — MRSA PCR SCREENING: MRSA by PCR: NEGATIVE

## 2014-10-24 MED ORDER — ATORVASTATIN CALCIUM 40 MG PO TABS
40.0000 mg | ORAL_TABLET | Freq: Every day | ORAL | Status: DC
  Filled 2014-10-24 (×4): qty 1

## 2014-10-24 MED ORDER — CETYLPYRIDINIUM CHLORIDE 0.05 % MT LIQD
7.0000 mL | Freq: Two times a day (BID) | OROMUCOSAL | Status: DC
Start: 2014-10-24 — End: 2014-10-29
  Administered 2014-10-24 – 2014-10-29 (×9): 7 mL via OROMUCOSAL

## 2014-10-24 NOTE — Consult Note (Signed)
Referring Physician: Dr. Rogue Bussing - [facility] Primary Cardiologist: Deboraha Sprang, MD Reason for Consultation: Elevated Troponin  HPI: 69 yo AA woman with HTN, DM, ESRD (on HD x 5 years via LUE AVF; Mon-Wed-Fri), EAT (treated with Amiodarone), CAD,  NSTEMI in 2010 (cath 60-70% mid RCA; subtotal occlusion of the PDA, 75% AV circ disease, 90% ostial OM1,  and a 30% proximal LAD, also had distal disease in her diagonal and her LAD), chronic LBBB, HFrEF (now improved EF), severe mitral stenosis by echo 01/2013 (mean grad 9 mmHg, MVA by continuity 1.6 cm2), colon cancer, and amputation of all right toes.   She reports that she started to have nonproductive cough the night before which got worse and then she started to have SOB. Denies any pain, syncope, diaphoresis, dizziness. She reports compliance with her dialysis. Apparently upon presentation she had hypoxemia and resp distress. She underwent hemodialysis emergently in the hospital and as dialysis progressed her breathing improved and Bipap was weaned off. At this time, denies any discomfort and resting comfortably.   Her iSTAT Trop I in ER was 0.14 and first formal Trop in the lab came back 1.9. BP is normal. EKG shows sinus rhythm, LBBB, no ischemia/injury pattern (unchanged from prior).    Review of Systems:  10 systems reviewed unremarkable except as noted in HPI   Past Medical History  Diagnosis Date  . Hyperlipidemia   . Mitral valve insufficiency and aortic valve insufficiency   . Cardiomyopathy- mixed   . 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 Chronic diastolic heart failure ([place])   . Anemia   . Coronary artery disease     Previously decreased EF; echo 113 normal LV function  . Hernia, incisional     abd  . History of colon cancer     history of colon cancer.  1986  . Arthritis   . LBBB (left bundle branch block)   . Atrial tachycardia ([place])     on amiod  .  Hypertensive heart disease     sees Dr. Alain Marion  . Stroke [facility])     2011/12  . Obesity   . Hypertension   . Glaucoma   . Renal failure     ESRD, Dr Dunham/Dr. Lyda Kalata.  M, W, Fr  . GERD (gastroesophageal reflux disease)   . Cancer [facility])     colon cancer  . Colostomy in place [facility])     Medications Prior to Admission  Medication Sig Dispense Refill  . acetaminophen (TYLENOL) 500 MG tablet Take 500 mg by mouth every 6 (six) hours as needed (pain).    [name] amiodarone (PACERONE) 200 MG tablet Take 0.5 tablets (100 mg total) by mouth daily. 45 tablet 6  . aspirin 325 MG tablet Take 325 mg by mouth daily.     [name] aspirin 325 MG tablet Take 325 mg by mouth every 6 (six) hours as needed for headache.    . calcium acetate (PHOSLO) 667 MG capsule Take 1,334-2,001 mg by mouth See admin instructions. Take 2001mg  three times daily with meals and Take 1334mg   with snacks.    . dorzolamide-timolol (COSOPT) 22.3-6.8 MG/ML ophthalmic solution Place 1 drop into the left eye 4 (four) times daily.  12  . Fluticasone-Salmeterol (ADVAIR) 250-50 MCG/DOSE AEPB Inhale 1 puff into the lungs daily as needed (shortness of breath).     . metoprolol (LOPRESSOR) 50 MG tablet Take 50 mg by  mouth 4 (four) times a week. Take 50mg  once a day on Tuesday, Thursday, Saturday, and Sunday. Skipping Dialysis days.    . prednisoLONE acetate (PRED FORTE) 1 % ophthalmic suspension Place 1 drop into the left eye 4 (four) times daily.    . repaglinide (PRANDIN) 0.5 MG tablet Take 1 tablet (0.5 mg total) by mouth 3 (three) times daily before meals. 90 tablet 11  . simvastatin (ZOCOR) 10 MG tablet Take 10 mg by mouth every evening.    [name] glucose blood (ONE TOUCH ULTRA TEST) test strip Check blood sugar 2 time per day. DX code E11.9 200 each 2     . amiodarone  100 mg Oral Daily  . antiseptic oral rinse  7 mL Mouth Rinse BID  . aspirin  325 mg Oral Daily  . [START ON 10/26/2014] calcitRIOL  1.25 mcg Oral Q M,W,F-HD  . calcium  acetate  2,001 mg Oral TID WC  . dorzolamide-timolol  1 drop Left Eye TID AC & HS  . enoxaparin (LOVENOX) injection  30 mg Subcutaneous Daily  . metoprolol  50 mg Oral Once per day on Sun Tue Thu Sat  . mometasone-formoterol  2 puff Inhalation BID  . prednisoLONE acetate  1 drop Left Eye TID AC & HS  . simvastatin  10 mg Oral QPM  . sodium chloride  3 mL Intravenous Q12H    Infusions: . sodium chloride      Allergies  Allergen Reactions  . Ace Inhibitors Cough  . Eggs Or Egg-Derived Products Nausea And Vomiting  . Enalapril Cough  . Lisinopril Cough  . Omnipaque [Iohexol] Hives    Social History   Social History  . Marital Status: Single    Spouse Name: N/A  . Number of Children: N/A  . Years of Education: N/A   Occupational History  . Not on file.   Social History Main Topics  . Smoking status: Never Smoker   . Smokeless tobacco: Never Used  . Alcohol Use: No  . Drug Use: No  . Sexual Activity: No   Other Topics Concern  . Not on file   Social History Narrative    Family History  Problem Relation Age of Onset  . Cancer Sister     colon  . Hypertension Other   . Hypertension Mother   . Coronary artery disease Mother   . Hypertension Father   . Diabetes Father     PHYSICAL EXAM: Filed Vitals:   10/23/14 2325  BP: 109/71  Pulse: 108  Temp: 98.5 F (36.9 C)  Resp: 19     Intake/Output Summary (Last 24 hours) at 10/24/14 0422 Last data filed at 10/24/14 0200  Gross per 24 hour  Intake      0 ml  Output   2500 ml  Net  -2500 ml    General:  Well appearing. No respiratory difficulty HEENT: normal Neck: supple. no JVD. Carotids 2+ bilat; no bruits. No lymphadenopathy or thryomegaly appreciated. Cor: PMI nondisplaced. Regular rate & rhythm. No rubs, gallops or murmurs. Lungs: minimal basal crackles Abdomen: soft, nontender, nondistended. No hepatosplenomegaly. No bruits or masses. Good bowel sounds. Extremities: no cyanosis/clubbing; +trace  edema; prior amputation of all R toes Neuro: alert & oriented x 3, cranial nerves grossly intact. moves all 4 extremities w/o difficulty. Affect pleasant.    Results for orders placed or performed during the hospital encounter of 10/23/14 (from the past 24 hour(s))  CBC with Differential     Status: Abnormal  Collection Time: 10/23/14  1:27 PM  Result Value Ref Range   WBC 11.2 (H) 4.0 - 10.5 K/uL   RBC 3.77 (L) 3.87 - 5.11 MIL/uL   Hemoglobin 10.7 (L) 12.0 - 15.0 g/dL   HCT 34.1 (L) 36.0 - 46.0 %   MCV 90.5 78.0 - 100.0 fL   MCH 28.4 26.0 - 34.0 pg   MCHC 31.4 30.0 - 36.0 g/dL   RDW 16.1 (H) 11.5 - 15.5 %   Platelets 165 150 - 400 K/uL   Neutrophils Relative % 81 %   Neutro Abs 9.1 (H) 1.7 - 7.7 K/uL   Lymphocytes Relative 12 %   Lymphs Abs 1.3 0.7 - 4.0 K/uL   Monocytes Relative 7 %   Monocytes Absolute 0.7 0.1 - 1.0 K/uL   Eosinophils Relative 0 %   Eosinophils Absolute 0.0 0.0 - 0.7 K/uL   Basophils Relative 0 %   Basophils Absolute 0.0 0.0 - 0.1 K/uL  Basic metabolic panel     Status: Abnormal   Collection Time: 10/23/14  1:27 PM  Result Value Ref Range   Sodium 138 135 - 145 mmol/L   Potassium 4.1 3.5 - 5.1 mmol/L   Chloride 98 (L) 101 - 111 mmol/L   CO2 24 22 - 32 mmol/L   Glucose, Bld 162 (H) 65 - 99 mg/dL   BUN 31 (H) 6 - 20 mg/dL   Creatinine, Ser 6.87 (H) 0.44 - 1.00 mg/dL   Calcium 8.6 (L) 8.9 - 10.3 mg/dL   GFR calc non Af Amer 6 (L) >60 mL/min   GFR calc Af Amer 6 (L) >60 mL/min   Anion gap 16 (H) 5 - 15  Brain natriuretic peptide     Status: Abnormal   Collection Time: 10/23/14  1:27 PM  Result Value Ref Range   B Natriuretic Peptide 1300.0 (H) 0.0 - 100.0 pg/mL  I-Stat Troponin, ED (not at [facility])     Status: Abnormal   Collection Time: 10/23/14  1:34 PM  Result Value Ref Range   Troponin i, poc 0.14 (HH) 0.00 - 0.08 ng/mL   Comment NOTIFIED PHYSICIAN    Comment 3          I-Stat venous blood gas, ED     Status: Abnormal   Collection Time: 10/23/14   1:36 PM  Result Value Ref Range   pH, Ven 7.451 (H) 7.250 - 7.300   pCO2, Ven 38.9 (L) 45.0 - 50.0 mmHg   pO2, Ven 81.0 (H) 30.0 - 45.0 mmHg   Bicarbonate 27.1 (H) 20.0 - 24.0 mEq/L   TCO2 28 0 - 100 mmol/L   O2 Saturation 96.0 %   Acid-Base Excess 3.0 (H) 0.0 - 2.0 mmol/L   Patient temperature HIDE    Sample type VENOUS   Glucose, capillary     Status: Abnormal   Collection Time: 10/23/14 11:23 PM  Result Value Ref Range   Glucose-Capillary 139 (H) 65 - 99 mg/dL  MRSA PCR Screening     Status: None   Collection Time: 10/23/14 11:43 PM  Result Value Ref Range   MRSA by PCR NEGATIVE NEGATIVE  Protime-INR     Status: Abnormal   Collection Time: 10/24/14  1:16 AM  Result Value Ref Range   Prothrombin Time 15.3 (H) 11.6 - 15.2 seconds   INR 1.19 0.00 - 1.49  TSH     Status: None   Collection Time: 10/24/14  1:16 AM  Result Value Ref Range   TSH 0.830  0.350 - 4.500 uIU/mL  Troponin I (q 6hr x 3)     Status: Abnormal   Collection Time: 10/24/14  1:16 AM  Result Value Ref Range   Troponin I 1.90 (HH) <0.031 ng/mL   Dg Chest 2 View  10/23/2014  CLINICAL DATA:  Shortness of Breath EXAM: CHEST  2 VIEW COMPARISON:  September 22, 2013 FINDINGS: There is mild generalized interstitial edema. There is cardiomegaly with pulmonary venous hypertension. No airspace consolidation. There is a minimal right pleural effusion. No adenopathy. No bone lesions. There is atherosclerotic change in the aorta. IMPRESSION: Findings consistent with a degree of congestive heart failure. No airspace consolidation. Electronically Signed   By: Lowella Grip III M.D.   On: 10/23/2014 12:55     ASSESSMENT:   1. Elevated Troponin - likely due to supply demand mismatch related to hypoxemia and pulmonary congestion/diastolic HF - Last echo suggested high trans mitral gradients indicative of severe stenosis with normal LVEF - She does have underlying CAD but presentation is not typical of epicardia coronary  plaque rupture/ACS - Not in shock; no unstable arrhythmia; SOB already resolved with dialysis - Already on ASA, Metoprolol (on non dialysis days), Statin (dose and intensity of statin is suboptimal although I am not aware of intolerance to high intensity statin in the past)     PLAN/DISCUSSION:  - Agree with echo and serial Trop - Consider high intensity statin like Atorvastatin 40-80 mg po qhs or Rosuvastatin 20-40 mg po qhs instead of low dose Simvastatin  Cardiology consult service will follow   Wandra Mannan, MD Cardiology

## 2014-10-24 NOTE — Progress Notes (Signed)
Patient Name: [name] Date of Encounter: 10/24/2014     Principal Problem:   Acute respiratory failure with hypoxia ([place]) Active Problems:   LBBB (left bundle branch block)   ESRD on dialysis ([place])   Shortness of breath   IDDM (insulin dependent diabetes mellitus) ([facility])   Elevated troponin   Congestive heart failure (CHF) ([place])   Acute hypoxemic respiratory failure ([place])    SUBJECTIVE  The patient denies any chest discomfort or shortness of breath today.  Her echocardiogram is pending.  Her second troponin is elevated at 3.07.  CURRENT MEDS . amiodarone  100 mg Oral Daily  . antiseptic oral rinse  7 mL Mouth Rinse BID  . aspirin  325 mg Oral Daily  . atorvastatin  40 mg Oral q1800  . [START ON 10/26/2014] calcitRIOL  1.25 mcg Oral Q M,W,F-HD  . calcium acetate  2,001 mg Oral TID WC  . dorzolamide-timolol  1 drop Left Eye TID AC & HS  . enoxaparin (LOVENOX) injection  30 mg Subcutaneous Daily  . metoprolol  50 mg Oral Once per day on Sun Tue Thu Sat  . mometasone-formoterol  2 puff Inhalation BID  . prednisoLONE acetate  1 drop Left Eye TID AC & HS  . sodium chloride  3 mL Intravenous Q12H    OBJECTIVE  Filed Vitals:   10/23/14 2325 10/24/14 0428 10/24/14 0818 10/24/14 1008  BP: 109/71 123/61  116/77  Pulse: 108 89    Temp: 98.5 F (36.9 C) 98 F (36.7 C)  97.5 F (36.4 C)  TempSrc: Oral Oral  Oral  Resp: 19 16    Height: 5' (1.524 m)     Weight: 199 lb 12.8 oz (90.629 kg)     SpO2: 98% 98% 95% 98%    Intake/Output Summary (Last 24 hours) at 10/24/14 1255 Last data filed at 10/24/14 0600  Gross per 24 hour  Intake      0 ml  Output   2500 ml  Net  -2500 ml   Filed Weights   10/23/14 2200 10/23/14 2325  Weight: 205 lb 0.4 oz (93 kg) 199 lb 12.8 oz (90.629 kg)    PHYSICAL EXAM  General: Pleasant, NAD. Neuro: Alert and oriented X 3. Moves all extremities spontaneously. Psych: Normal affect. HEENT:  Normal  Neck: Supple without bruits or  JVD. Lungs:  Resp regular and unlabored, CTA. Heart: Regular sinus rhythm.  Grade 2/6 systolic ejection murmur loudest at the base.  I do not hear any mitral rumble. Abdomen: Soft, non-tender, non-distended, BS + x 4.  Extremities: No edema   Accessory Clinical Findings  CBC  Recent Labs  10/23/14 1327 10/24/14 0519  WBC 11.2* 7.7  NEUTROABS 9.1*  --   HGB 10.7* 10.6*  HCT 34.1* 34.4*  MCV 90.5 90.8  PLT 165 517   Basic Metabolic Panel  Recent Labs  10/23/14 1327 10/24/14 0519  NA 138 140  K 4.1 3.3*  CL 98* 98*  CO2 24 29  GLUCOSE 162* 132*  BUN 31* 14  CREATININE 6.87* 4.10*  CALCIUM 8.6* 8.8*   Liver Function Tests No results for input(s): AST, ALT, ALKPHOS, BILITOT, PROT, ALBUMIN in the last 72 hours. No results for input(s): LIPASE, AMYLASE in the last 72 hours. Cardiac Enzymes  Recent Labs  10/24/14 0116 10/24/14 0519  TROPONINI 1.90* 3.07*   BNP Invalid input(s): POCBNP D-Dimer No results for input(s): DDIMER in the last 72 hours. Hemoglobin A1C No results for input(s):  HGBA1C in the last 72 hours. Fasting Lipid Panel No results for input(s): CHOL, HDL, LDLCALC, TRIG, CHOLHDL, LDLDIRECT in the last 72 hours. Thyroid Function Tests  Recent Labs  10/24/14 0116  TSH 0.830    TELE  Normal sinus rhythm  ECG Normal sinus rhythm with left bundle branch block   Radiology/Studies  Dg Chest 2 View  10/23/2014  CLINICAL DATA:  Shortness of Breath EXAM: CHEST  2 VIEW COMPARISON:  September 22, 2013 FINDINGS: There is mild generalized interstitial edema. There is cardiomegaly with pulmonary venous hypertension. No airspace consolidation. There is a minimal right pleural effusion. No adenopathy. No bone lesions. There is atherosclerotic change in the aorta. IMPRESSION: Findings consistent with a degree of congestive heart failure. No airspace consolidation. Electronically Signed   By: Lowella Grip III M.D.   On: 10/23/2014 12:55     ASSESSMENT AND PLAN 1. Elevated Troponin - likely due to supply demand mismatch related to hypoxemia and pulmonary congestion/diastolic HF - Last echo suggested high trans mitral gradients indicative of severe stenosis with normal LVEF.  Repeat echo pending - She does have underlying CAD but presentation is not typical of epicardia coronary plaque rupture/ACS  Plan: Continue current meds.  Consider high intensity statin if she can tolerate it.  Results of echocardiogram.  Signed, Darlin Coco MD

## 2014-10-24 NOTE — Progress Notes (Signed)
Patient arrived on unit from HD on stretcher with RN. Patient alert and oriented x4. Patient oriented to unit, room, and staff. No family at bedside. Patient placed on telemetry monitor, CCMD notified. Skin assessment completed with two RNs, check flow sheets. Patient denies having any pain. Safety Fall Prevention Plan was given, discussed and signed by patient. Orders have been reviewed and implemented. Call light has been placed within reach and bed alarm has been activated. RN will continue to monitor the patient.  Nena Polio BSN, RN  Phone Number: [phone]

## 2014-10-24 NOTE — Progress Notes (Signed)
Paged Dr. Hartford Poli at [phone]. He immediately called me back and made him aware of elevated troponin of 3.07, pt asymptomatic.

## 2014-10-24 NOTE — Progress Notes (Signed)
Subjective:  No cos, tolerated HD yest/ dialysis "fixed" her SOB  Objective Vital signs in last 24 hours: Filed Vitals:   10/23/14 2325 10/24/14 0428 10/24/14 0818 10/24/14 1008  BP: 109/71 123/61    Pulse: 108 89    Temp: 98.5 F (36.9 C) 98 F (36.7 C)  97.5 F (36.4 C)  TempSrc: Oral Oral  Oral  Resp: 19 16    Height: 5' (1.524 m)     Weight: 90.629 kg (199 lb 12.8 oz)     SpO2: 98% 98% 95% 98%   Weight change:   Physical Exam: General:  NAD, OX3 , appropriate  Lungs:  Slightly Decr .  BS throughout  . Breathing  unlabored Heart: RRR no mur / gallop/  Or rub  Abdomen: Obese Soft, non-tender, non-distended with ostomy . Extremities: without pedal  edema / right transmet well healed Dialysis Access:  L LA  AVF + bruit   Op Dialysis Orders: [facility] M[facility] 160 4 hr 400/A1.5 left upper AVF heparin 3000 profile 4 3 K 2.25 Ca EDW 91 (recently lowered from 92 and leaving at 90.9 and 90.5 with modest goals) Mircera 75 q 2 weeks - given 10/12 no Fe calcitriol 1.25 Recent labs: Hgb 11.4 10/12 27% sat in Sept iPTH 223 K 3.7 - 3.9 on 3 K bath Ca ok P +/- 5.5 on 4 phoslo ac  Problem/Plan: 1. SOB /mild volume excess on xray- 2500 l uf yest with hd sob  symptoms resolved/at op edw post hd/ BUT ?? Bed wt   appears to be losing weight as OP kid [facility] leaving below EDW- needs EDW down /  Also positive tropins  With Card eval in Process 2 d pending this am  2. ESRD - MWF - [facility]  HD  Monday - K 3.3  Am - use 4  K bath fu am labs  And pre hd Monday  K 3. Hypertension/volume - BP 123/61 this  am  lower than usual - outpt BPs run 150 - 160s lately/ on Metoprolol 50mg  Tues Thurs , Sat, Sundays ? Op compliance with low bp in hosp.  4. Anemia - Hgb 10.6 - had Mircera 75 10/12 - no ESA due until 10/26 5. Metabolic bone disease - Ca 8.8 - / calcitriol 1.25  On HD  on 4 phoslo ac tid 6. Nutrition - needs renal diet/vit 7. Diabetes - per primary 8. Atrial tachy cardia - on BB on non HD days and  amiodarone - followed by Dr. Felecia Shelling, PA-C [facility] [phone] 10/24/2014,10:16 AM  LOS: 1 day    Patient seen and examined, agree with above note with above modifications. Feels better after 2500 removed- will need lower EDW as OP- cards eval as well.  Plan for next HD on Monday  Corliss Parish, MD 10/24/2014     Labs: Basic Metabolic Panel:  Recent Labs Lab 10/23/14 1327 10/24/14 0519  NA 138 140  K 4.1 3.3*  CL 98* 98*  CO2 24 29  GLUCOSE 162* 132*  BUN 31* 14  CREATININE 6.87* 4.10*  CALCIUM 8.6* 8.8*   Liver Function Tests: No results for input(s): AST, ALT, ALKPHOS, BILITOT, PROT, ALBUMIN in the last 168 hours. No results for input(s): LIPASE, AMYLASE in the last 168 hours. No results for input(s): AMMONIA in the last 168 hours. CBC:  Recent Labs Lab 10/23/14 1327 10/24/14 0519  WBC 11.2* 7.7  NEUTROABS 9.1*  --   HGB 10.7*  10.6*  HCT 34.1* 34.4*  MCV 90.5 90.8  PLT 165 226   Cardiac Enzymes:  Recent Labs Lab 10/24/14 0116 10/24/14 0519  TROPONINI 1.90* 3.07*   CBG:  Recent Labs Lab 10/23/14 2323 10/24/14 0423  GLUCAP 139* 132*    Studies/Results: Dg Chest 2 View  10/23/2014  CLINICAL DATA:  Shortness of Breath EXAM: CHEST  2 VIEW COMPARISON:  September 22, 2013 FINDINGS: There is mild generalized interstitial edema. There is cardiomegaly with pulmonary venous hypertension. No airspace consolidation. There is a minimal right pleural effusion. No adenopathy. No bone lesions. There is atherosclerotic change in the aorta. IMPRESSION: Findings consistent with a degree of congestive heart failure. No airspace consolidation. Electronically Signed   By: Lowella Grip III M.D.   On: 10/23/2014 12:55   Medications: . sodium chloride     . amiodarone  100 mg Oral Daily  . antiseptic oral rinse  7 mL Mouth Rinse BID  . aspirin  325 mg Oral Daily  . atorvastatin  40 mg Oral q1800  . [START ON  10/26/2014] calcitRIOL  1.25 mcg Oral Q M,W,F-HD  . calcium acetate  2,001 mg Oral TID WC  . dorzolamide-timolol  1 drop Left Eye TID AC & HS  . enoxaparin (LOVENOX) injection  30 mg Subcutaneous Daily  . metoprolol  50 mg Oral Once per day on Sun Tue Thu Sat  . mometasone-formoterol  2 puff Inhalation BID  . prednisoLONE acetate  1 drop Left Eye TID AC & HS  . sodium chloride  3 mL Intravenous Q12H

## 2014-10-24 NOTE — Progress Notes (Signed)
[facility] PROGRESS NOTE   CIERAH CRADER SUG:[id number] DOB: 04/13/1945 DOA: 10/23/2014 PCP: Walker Kehr, MD  HPI/Subjective: Feels much better, denies any specific complaints. In particular denies any chest pain, denies shortness of breath. On 2 L of oxygen sats 98-100%.  Assessment/Plan: Principal Problem:   Acute respiratory failure with hypoxia (HCC) Active Problems:   LBBB (left bundle branch block)   ESRD on dialysis ([place])   Shortness of breath   IDDM (insulin dependent diabetes mellitus) (HCC)   Elevated troponin   Congestive heart failure (CHF) ([place])   Acute hypoxemic respiratory failure ([place])   Acute hypoxic respiratory failure Patient presented to the hospital with shortness of breath, CXR finding of mild pulmonary edema and O2 sats of 87% on RA. Findings are secondary to congestive heart failure, likely acute on chronic diastolic.[name] Placed initially on O2 via nasal cannula, still short of breath, placed on BiPAP. This is improved after dialysis.   Non-STEMI Initially troponin was slightly elevated at 0.1, patient denies any chest pain troponin increased to 3.07.  Patient is on aspirin and beta blockers, continue. 2-D echo to rule out regional WMA. Cardiology consulted, this is likely not ACS but the high troponin qualifies her for non-STEMI. Patient is on aspirin and beta blockers, continue. Recheck FLP in a.m. EKG showed first-degree AV block, left axis deviation and LBBB, cardiology to advise. I will let cardiology decide if patient needs heparin drip.  Congestive heart failure Patient has history of diastolic congestive heart failure, chest x-ray showed pulmonary edema. 2-D echo to be done, last echo was in January 2015. Showed LVEF of 55-60%  ESRD Dialysis days Monday, Wednesday and Friday, with evidence of pulmonary edema on x-ray. Nephrology already notified and so the patient, for dialysis later today.  Diabetes mellitus type 2 Last A1c  7.4 from January 2016, patient is been on Prandin, hold. Carbohydrate modified diet and insulin sliding scale, check A1c again.  LBBB Has old left bundle branch block on her EKG, no changes since last study.  Code Status: Full Code Family Communication: Plan discussed with the patient. Disposition Plan: Remains inpatient Diet: Diet renal/carb modified with fluid restriction Diet-HS Snack?: Nothing; Room service appropriate?: Yes; Fluid consistency:: Thin  Consultants:   cardiology.  Nephrology   Procedures:  Hemodialysis   Antibiotics:  None    Objective: Filed Vitals:   10/24/14 1008  BP: 116/77  Pulse:   Temp: 97.5 F (36.4 C)  Resp:     Intake/Output Summary (Last 24 hours) at 10/24/14 1135 Last data filed at 10/24/14 0600  Gross per 24 hour  Intake      0 ml  Output   2500 ml  Net  -2500 ml   Filed Weights   10/23/14 2200 10/23/14 2325  Weight: 93 kg (205 lb 0.4 oz) 90.629 kg (199 lb 12.8 oz)    Exam: General: Alert and awake, oriented x3, not in any acute distress. HEENT: anicteric sclera, pupils reactive to light and accommodation, EOMI CVS: S1-S2 clear, no murmur rubs or gallops Chest: clear to auscultation bilaterally, no wheezing, rales or rhonchi Abdomen: soft nontender, nondistended, normal bowel sounds, no organomegaly Extremities: no cyanosis, clubbing or edema noted bilaterally Neuro: Cranial nerves II-XII intact, no focal neurological deficits  Data Reviewed: Basic Metabolic Panel:  Recent Labs Lab 10/23/14 1327 10/24/14 0519  NA 138 140  K 4.1 3.3*  CL 98* 98*  CO2 24 29  GLUCOSE 162* 132*  BUN 31* 14  CREATININE 6.87* 4.10*  CALCIUM 8.6* 8.8*   Liver Function Tests: No results for input(s): AST, ALT, ALKPHOS, BILITOT, PROT, ALBUMIN in the last 168 hours. No results for input(s): LIPASE, AMYLASE in the last 168 hours. No results for input(s): AMMONIA in the last 168 hours. CBC:  Recent Labs Lab 10/23/14 1327  10/24/14 0519  WBC 11.2* 7.7  NEUTROABS 9.1*  --   HGB 10.7* 10.6*  HCT 34.1* 34.4*  MCV 90.5 90.8  PLT 165 226   Cardiac Enzymes:  Recent Labs Lab 10/24/14 0116 10/24/14 0519  TROPONINI 1.90* 3.07*   BNP (last 3 results)  Recent Labs  10/23/14 1327  BNP 1300.0*    ProBNP (last 3 results) No results for input(s): PROBNP in the last 8760 hours.  CBG:  Recent Labs Lab 10/23/14 2323 10/24/14 0423  GLUCAP 139* 132*    Micro Recent Results (from the past 240 hour(s))  MRSA PCR Screening     Status: None   Collection Time: 10/23/14 11:43 PM  Result Value Ref Range Status   MRSA by PCR NEGATIVE NEGATIVE Final    Comment:        The GeneXpert MRSA Assay (FDA approved for NASAL specimens only), is one component of a comprehensive MRSA colonization surveillance program. It is not intended to diagnose MRSA infection nor to guide or monitor treatment for MRSA infections.      Studies: Dg Chest 2 View  10/23/2014  CLINICAL DATA:  Shortness of Breath EXAM: CHEST  2 VIEW COMPARISON:  September 22, 2013 FINDINGS: There is mild generalized interstitial edema. There is cardiomegaly with pulmonary venous hypertension. No airspace consolidation. There is a minimal right pleural effusion. No adenopathy. No bone lesions. There is atherosclerotic change in the aorta. IMPRESSION: Findings consistent with a degree of congestive heart failure. No airspace consolidation. Electronically Signed   By: Lowella Grip III M.D.   On: 10/23/2014 12:55    Scheduled Meds: . amiodarone  100 mg Oral Daily  . antiseptic oral rinse  7 mL Mouth Rinse BID  . aspirin  325 mg Oral Daily  . atorvastatin  40 mg Oral q1800  . [START ON 10/26/2014] calcitRIOL  1.25 mcg Oral Q M,W,F-HD  . calcium acetate  2,001 mg Oral TID WC  . dorzolamide-timolol  1 drop Left Eye TID AC & HS  . enoxaparin (LOVENOX) injection  30 mg Subcutaneous Daily  . metoprolol  50 mg Oral Once per day on Sun Tue Thu  Sat  . mometasone-formoterol  2 puff Inhalation BID  . prednisoLONE acetate  1 drop Left Eye TID AC & HS  . sodium chloride  3 mL Intravenous Q12H   Continuous Infusions: . sodium chloride         Time spent: 35 minutes    Maryum Batterson A  [facility] Pager [phone] If 7PM-7AM, please contact night-coverage at [email or web address], password [facility] 10/24/2014, 11:35 AM  LOS: 1 day

## 2014-10-24 NOTE — Progress Notes (Signed)
Critical lab value Troponin: 1.90 MD notified. RN will continue to monitor patient.   Ermalinda Memos, RN

## 2014-10-25 ENCOUNTER — Inpatient Hospital Stay (HOSPITAL_COMMUNITY): Payer: Medicare Other

## 2014-10-25 DIAGNOSIS — I509 Heart failure, unspecified: Secondary | ICD-10-CM

## 2014-10-25 LAB — CBC
HCT: 30.2 % — ABNORMAL LOW (ref 36.0–46.0)
HEMOGLOBIN: 9.6 g/dL — AB (ref 12.0–15.0)
MCH: 28.4 pg (ref 26.0–34.0)
MCHC: 31.8 g/dL (ref 30.0–36.0)
MCV: 89.3 fL (ref 78.0–100.0)
Platelets: 255 10*3/uL (ref 150–400)
RBC: 3.38 MIL/uL — AB (ref 3.87–5.11)
RDW: 16.3 % — ABNORMAL HIGH (ref 11.5–15.5)
WBC: 8.7 10*3/uL (ref 4.0–10.5)

## 2014-10-25 LAB — BASIC METABOLIC PANEL
Anion gap: 14 (ref 5–15)
BUN: 34 mg/dL — ABNORMAL HIGH (ref 6–20)
CO2: 26 mmol/L (ref 22–32)
Calcium: 9 mg/dL (ref 8.9–10.3)
Chloride: 94 mmol/L — ABNORMAL LOW (ref 101–111)
Creatinine, Ser: 5.89 mg/dL — ABNORMAL HIGH (ref 0.44–1.00)
GFR calc Af Amer: 8 mL/min — ABNORMAL LOW (ref >60)
GFR calc non Af Amer: 7 mL/min — ABNORMAL LOW (ref >60)
Glucose, Bld: 134 mg/dL — ABNORMAL HIGH (ref 65–99)
Potassium: 5.1 mmol/L (ref 3.5–5.1)
Sodium: 134 mmol/L — ABNORMAL LOW (ref 135–145)

## 2014-10-25 LAB — LIPID PANEL
CHOL/HDL RATIO: 6.1 ratio
CHOLESTEROL: 166 mg/dL (ref 0–200)
HDL: 27 mg/dL — AB (ref >40)
LDL CALC: 98 mg/dL (ref 0–99)
TRIGLYCERIDES: 205 mg/dL — AB (ref <150)
VLDL: 41 mg/dL — AB (ref 0–40)

## 2014-10-25 NOTE — Plan of Care (Signed)
Problem: Phase I Progression Outcomes Goal: EF % per last Echo/documented,Core Reminder form on chart Outcome: Completed/Met Date Met:  10/25/14 EF 35-40%.

## 2014-10-25 NOTE — Progress Notes (Signed)
Patient Name: [name] Date of Encounter: 10/25/2014     Principal Problem:   Acute respiratory failure with hypoxia ([place]) Active Problems:   LBBB (left bundle branch block)   ESRD on dialysis ([place])   Shortness of breath   IDDM (insulin dependent diabetes mellitus) (HCC)   Elevated troponin   Congestive heart failure (CHF) ([place])   Acute hypoxemic respiratory failure ([place])    SUBJECTIVE  The patient feels well today.  She denies any dyspnea on room air.  She has had no chest pain during the hospitalization.  Her troponins are now trending down.  Her telemetry shows normal sinus rhythm with left bundle branch block.  Echocardiogram results are pending  CURRENT MEDS . amiodarone  100 mg Oral Daily  . antiseptic oral rinse  7 mL Mouth Rinse BID  . aspirin  325 mg Oral Daily  . atorvastatin  40 mg Oral q1800  . [START ON 10/26/2014] calcitRIOL  1.25 mcg Oral Q M,W,F-HD  . calcium acetate  2,001 mg Oral TID WC  . dorzolamide-timolol  1 drop Left Eye TID AC & HS  . enoxaparin (LOVENOX) injection  30 mg Subcutaneous Daily  . metoprolol  50 mg Oral Once per day on Sun Tue Thu Sat  . mometasone-formoterol  2 puff Inhalation BID  . prednisoLONE acetate  1 drop Left Eye TID AC & HS  . sodium chloride  3 mL Intravenous Q12H    OBJECTIVE  Filed Vitals:   10/25/14 0530 10/25/14 0812 10/25/14 0826 10/25/14 1000  BP: 141/53 131/62  126/74  Pulse: 83 82  80  Temp: 97.8 F (36.6 C) 97.5 F (36.4 C)    TempSrc: Oral Oral    Resp: 18 18    Height:      Weight:      SpO2: 99% 95% 96%     Intake/Output Summary (Last 24 hours) at 10/25/14 1156 Last data filed at 10/25/14 0916  Gross per 24 hour  Intake    480 ml  Output      1 ml  Net    479 ml   Filed Weights   10/23/14 2200 10/23/14 2325 10/24/14 2051  Weight: 205 lb 0.4 oz (93 kg) 199 lb 12.8 oz (90.629 kg) 200 lb (90.719 kg)    PHYSICAL EXAM  General: Pleasant, NAD. Neuro: Alert and oriented X 3. Moves all  extremities spontaneously. Psych: Normal affect. HEENT:  Normal  Neck: Supple without bruits or JVD. Lungs:  Resp regular and unlabored, CTA. Heart: RRR no s3, s4.  There is a grade 2/6 systolic ejection murmur at the aortic area. Abdomen: Soft, non-tender, non-distended.  Colostomy and urostomy sites are unremarkable.  Extremities: No peripheral edema  Accessory Clinical Findings  CBC  Recent Labs  10/23/14 1327 10/24/14 0519 10/25/14 0545  WBC 11.2* 7.7 8.7  NEUTROABS 9.1*  --   --   HGB 10.7* 10.6* 9.6*  HCT 34.1* 34.4* 30.2*  MCV 90.5 90.8 89.3  PLT 165 226 350   Basic Metabolic Panel  Recent Labs  10/24/14 0519 10/25/14 0545  NA 140 134*  K 3.3* 5.1  CL 98* 94*  CO2 29 26  GLUCOSE 132* 134*  BUN 14 34*  CREATININE 4.10* 5.89*  CALCIUM 8.8* 9.0   Liver Function Tests No results for input(s): AST, ALT, ALKPHOS, BILITOT, PROT, ALBUMIN in the last 72 hours. No results for input(s): LIPASE, AMYLASE in the last 72 hours. Cardiac Enzymes  Recent Labs  10/24/14 0116 10/24/14 0519 10/24/14 1200  TROPONINI 1.90* 3.07* 2.92*   BNP Invalid input(s): POCBNP D-Dimer No results for input(s): DDIMER in the last 72 hours. Hemoglobin A1C No results for input(s): HGBA1C in the last 72 hours. Fasting Lipid Panel  Recent Labs  10/25/14 0545  CHOL 166  HDL 27*  LDLCALC 98  TRIG 205*  CHOLHDL 6.1   Thyroid Function Tests  Recent Labs  10/24/14 0116  TSH 0.830    TELE  Normal sinus rhythm  ECG    Radiology/Studies  Dg Chest 2 View  10/23/2014  CLINICAL DATA:  Shortness of Breath EXAM: CHEST  2 VIEW COMPARISON:  September 22, 2013 FINDINGS: There is mild generalized interstitial edema. There is cardiomegaly with pulmonary venous hypertension. No airspace consolidation. There is a minimal right pleural effusion. No adenopathy. No bone lesions. There is atherosclerotic change in the aorta. IMPRESSION: Findings consistent with a degree of congestive  heart failure. No airspace consolidation. Electronically Signed   By: Lowella Grip III M.D.   On: 10/23/2014 12:55    ASSESSMENT AND PLAN 1. Elevated Troponin - likely due to supply demand mismatch related to hypoxemia and pulmonary congestion/diastolic HF - Last echo suggested high trans mitral gradients indicative of severe stenosis with normal LVEF. Repeat echo pending - She does have underlying CAD but presentation is not typical of epicardial coronary plaque rupture/ACS  Plan: Continue current meds.Await results of echo.  Signed, Darlin Coco MD

## 2014-10-25 NOTE — Progress Notes (Signed)
Utilization Review Completed.Demari Kropp T10/16/2016  

## 2014-10-25 NOTE — Progress Notes (Signed)
Subjective:  No c/os- did not notice yest her troponin up to 3,  Was 2.92 later- getting echo now- wants to go home Objective Vital signs in last 24 hours: Filed Vitals:   10/24/14 2051 10/25/14 0530 10/25/14 0812 10/25/14 0826  BP: 118/54 141/53 131/62   Pulse: 81 83 82   Temp: 97.6 F (36.4 C) 97.8 F (36.6 C) 97.5 F (36.4 C)   TempSrc: Oral Oral Oral   Resp: 18 18 18    Height:      Weight: 90.719 kg (200 lb)     SpO2: 94% 99% 95% 96%   Weight change: -2.28 kg (-5 lb 0.4 oz)  Physical Exam: General:  NAD, OX3 , appropriate  Lungs:  Slightly Decr .  BS throughout  . Breathing  unlabored Heart: RRR no mur / gallop/  Or rub  Abdomen: Obese Soft, non-tender, non-distended with ostomy . Extremities: without pedal  edema / right transmet well healed Dialysis Access:  L LA  AVF + bruit   Op Dialysis Orders: [facility] 160 4 hr 400/A1.5 left upper AVF heparin 3000 profile 4 3 K 2.25 Ca EDW 91 (recently lowered from 92 and leaving at 90.9 and 90.5 with modest goals) Mircera 75 q 2 weeks - given 10/12 no Fe calcitriol 1.25 Recent labs: Hgb 11.4 10/12 27% sat in Sept iPTH 223 K 3.7 - 3.9 on 3 K bath Ca ok P +/- 5.5 on 4 phoslo ac  Problem/Plan: 1. SOB /mild volume excess on xray- 2500  uf yest with hd sob  symptoms resolved- needs EDW down /  Also positive tropins  With Card eval in Process 2 d echo done- pending results 2. ESRD - MWF - Next  HD  Monday via AVF -  3. Hypertension/volume - BP 123/61 this  am  lower than usual - outpt BPs run 150 - 160s lately/ on Metoprolol 50mg  Tues Thurs , Sat, Sundays ? Op compliance with low bp in hosp.  4. Anemia - Hgb 10.6 ---> 9.6  had Mircera 75 10/12 - no ESA due until 10/26 5. Metabolic bone disease - Ca 8.8 - / calcitriol 1.25  On HD  on 4 phoslo ac tid 6. Nutrition - needs renal diet/vit 7. Diabetes - per primary 8. Atrial tachy cardia - on BB on non HD days and amiodarone - followed by Dr. Helene Kelp,  MD 10/25/2014     Labs: Basic Metabolic Panel:  Recent Labs Lab 10/23/14 1327 10/24/14 0519 10/25/14 0545  NA 138 140 134*  K 4.1 3.3* 5.1  CL 98* 98* 94*  CO2 24 29 26   GLUCOSE 162* 132* 134*  BUN 31* 14 34*  CREATININE 6.87* 4.10* 5.89*  CALCIUM 8.6* 8.8* 9.0   Liver Function Tests: No results for input(s): AST, ALT, ALKPHOS, BILITOT, PROT, ALBUMIN in the last 168 hours. No results for input(s): LIPASE, AMYLASE in the last 168 hours. No results for input(s): AMMONIA in the last 168 hours. CBC:  Recent Labs Lab 10/23/14 1327 10/24/14 0519 10/25/14 0545  WBC 11.2* 7.7 8.7  NEUTROABS 9.1*  --   --   HGB 10.7* 10.6* 9.6*  HCT 34.1* 34.4* 30.2*  MCV 90.5 90.8 89.3  PLT 165 226 255   Cardiac Enzymes:  Recent Labs Lab 10/24/14 0116 10/24/14 0519 10/24/14 1200  TROPONINI 1.90* 3.07* 2.92*   CBG:  Recent Labs Lab 10/23/14 2323 10/24/14 0423 10/24/14 1633  GLUCAP 139* 132* 163*    Studies/Results: Dg  Chest 2 View  10/23/2014  CLINICAL DATA:  Shortness of Breath EXAM: CHEST  2 VIEW COMPARISON:  September 22, 2013 FINDINGS: There is mild generalized interstitial edema. There is cardiomegaly with pulmonary venous hypertension. No airspace consolidation. There is a minimal right pleural effusion. No adenopathy. No bone lesions. There is atherosclerotic change in the aorta. IMPRESSION: Findings consistent with a degree of congestive heart failure. No airspace consolidation. Electronically Signed   By: Lowella Grip III M.D.   On: 10/23/2014 12:55   Medications: . sodium chloride     . amiodarone  100 mg Oral Daily  . antiseptic oral rinse  7 mL Mouth Rinse BID  . aspirin  325 mg Oral Daily  . atorvastatin  40 mg Oral q1800  . [START ON 10/26/2014] calcitRIOL  1.25 mcg Oral Q M,W,F-HD  . calcium acetate  2,001 mg Oral TID WC  . dorzolamide-timolol  1 drop Left Eye TID AC & HS  . enoxaparin (LOVENOX) injection  30 mg Subcutaneous Daily  . metoprolol   50 mg Oral Once per day on Sun Tue Thu Sat  . mometasone-formoterol  2 puff Inhalation BID  . prednisoLONE acetate  1 drop Left Eye TID AC & HS  . sodium chloride  3 mL Intravenous Q12H

## 2014-10-25 NOTE — Progress Notes (Signed)
[facility] PROGRESS NOTE   ZUHA DEJONGE DXA:[id number] DOB: 10-10-1945 DOA: 10/23/2014 PCP: Walker Kehr, MD  HPI/Subjective: Feeling much better, denies any SOB or chest pain. Currently on room air saturation is likely 98% Likely can be discharged in a.m. after dialysis if no plans to do further cardiac workup.  Assessment/Plan: Principal Problem:   Acute respiratory failure with hypoxia (HCC) Active Problems:   LBBB (left bundle branch block)   ESRD on dialysis ([place])   Shortness of breath   IDDM (insulin dependent diabetes mellitus) (HCC)   Elevated troponin   Congestive heart failure (CHF) ([place])   Acute hypoxemic respiratory failure ([place])   Acute hypoxic respiratory failure Patient presented to the hospital with shortness of breath, CXR finding of mild pulmonary edema and O2 sats of 87% on RA. Findings are secondary to congestive heart failure, likely acute on chronic diastolic.[name] Placed initially on O2 via nasal cannula, still short of breath, placed on BiPAP. This is improved after dialysis.   Non-STEMI Initially troponin was slightly elevated at 0.1, patient denies any chest pain troponin increased to 3.07.  Patient is on aspirin and beta blockers, continue. 2-D echo to rule out regional WMA. Cardiology consulted, this is likely not ACS but the high troponin qualifies her for non-STEMI. Patient is on aspirin and beta blockers, continue.  2-D echo still pending.  Dyslipidemia LDL is 98 and total cholesterol is 166.  Congestive heart failure Patient has history of diastolic congestive heart failure, chest x-ray showed pulmonary edema. 2-D echo to be done, last echo was in January 2015. Showed LVEF of 55-60%  ESRD Dialysis days Monday, Wednesday and Friday, with evidence of pulmonary edema on x-ray. Nephrology already notified and so the patient, for dialysis later today.  Diabetes mellitus type 2 Last A1c 7.4 from January 2016, patient is been on  Prandin, hold. Carbohydrate modified diet and insulin sliding scale, check A1c again.  LBBB Has old left bundle branch block on her EKG, no changes since last study.  Code Status: Full Code Family Communication: Plan discussed with the patient. Disposition Plan: Remains inpatient Diet: Diet renal/carb modified with fluid restriction Diet-HS Snack?: Nothing; Room service appropriate?: Yes; Fluid consistency:: Thin  Consultants:   cardiology.  Nephrology   Procedures:  Hemodialysis   Antibiotics:  None    Objective: Filed Vitals:   10/25/14 1000  BP: 126/74  Pulse: 80  Temp:   Resp:     Intake/Output Summary (Last 24 hours) at 10/25/14 1312 Last data filed at 10/25/14 0916  Gross per 24 hour  Intake    240 ml  Output      1 ml  Net    239 ml   Filed Weights   10/23/14 2200 10/23/14 2325 10/24/14 2051  Weight: 93 kg (205 lb 0.4 oz) 90.629 kg (199 lb 12.8 oz) 90.719 kg (200 lb)    Exam: General: Alert and awake, oriented x3, not in any acute distress. HEENT: anicteric sclera, pupils reactive to light and accommodation, EOMI CVS: S1-S2 clear, no murmur rubs or gallops Chest: clear to auscultation bilaterally, no wheezing, rales or rhonchi Abdomen: soft nontender, nondistended, normal bowel sounds, no organomegaly Extremities: no cyanosis, clubbing or edema noted bilaterally Neuro: Cranial nerves II-XII intact, no focal neurological deficits  Data Reviewed: Basic Metabolic Panel:  Recent Labs Lab 10/23/14 1327 10/24/14 0519 10/25/14 0545  NA 138 140 134*  K 4.1 3.3* 5.1  CL 98* 98* 94*  CO2 24 29 26   GLUCOSE 162*  132* 134*  BUN 31* 14 34*  CREATININE 6.87* 4.10* 5.89*  CALCIUM 8.6* 8.8* 9.0   Liver Function Tests: No results for input(s): AST, ALT, ALKPHOS, BILITOT, PROT, ALBUMIN in the last 168 hours. No results for input(s): LIPASE, AMYLASE in the last 168 hours. No results for input(s): AMMONIA in the last 168 hours. CBC:  Recent Labs Lab  10/23/14 1327 10/24/14 0519 10/25/14 0545  WBC 11.2* 7.7 8.7  NEUTROABS 9.1*  --   --   HGB 10.7* 10.6* 9.6*  HCT 34.1* 34.4* 30.2*  MCV 90.5 90.8 89.3  PLT 165 226 255   Cardiac Enzymes:  Recent Labs Lab 10/24/14 0116 10/24/14 0519 10/24/14 1200  TROPONINI 1.90* 3.07* 2.92*   BNP (last 3 results)  Recent Labs  10/23/14 1327  BNP 1300.0*    ProBNP (last 3 results) No results for input(s): PROBNP in the last 8760 hours.  CBG:  Recent Labs Lab 10/23/14 2323 10/24/14 0423 10/24/14 1633  GLUCAP 139* 132* 163*    Micro Recent Results (from the past 240 hour(s))  MRSA PCR Screening     Status: None   Collection Time: 10/23/14 11:43 PM  Result Value Ref Range Status   MRSA by PCR NEGATIVE NEGATIVE Final    Comment:        The GeneXpert MRSA Assay (FDA approved for NASAL specimens only), is one component of a comprehensive MRSA colonization surveillance program. It is not intended to diagnose MRSA infection nor to guide or monitor treatment for MRSA infections.      Studies: No results found.  Scheduled Meds: . amiodarone  100 mg Oral Daily  . antiseptic oral rinse  7 mL Mouth Rinse BID  . aspirin  325 mg Oral Daily  . atorvastatin  40 mg Oral q1800  . [START ON 10/26/2014] calcitRIOL  1.25 mcg Oral Q M,W,F-HD  . calcium acetate  2,001 mg Oral TID WC  . dorzolamide-timolol  1 drop Left Eye TID AC & HS  . enoxaparin (LOVENOX) injection  30 mg Subcutaneous Daily  . metoprolol  50 mg Oral Once per day on Sun Tue Thu Sat  . mometasone-formoterol  2 puff Inhalation BID  . prednisoLONE acetate  1 drop Left Eye TID AC & HS  . sodium chloride  3 mL Intravenous Q12H   Continuous Infusions: . sodium chloride         Time spent: 35 minutes    Mirriam Vadala A  [facility] Pager [phone] If 7PM-7AM, please contact night-coverage at [email or web address], password [facility] 10/25/2014, 1:12 PM  LOS: 2 days

## 2014-10-25 NOTE — Plan of Care (Deleted)
Problem: Phase I Progression Outcomes Goal: EF % per last Echo/documented,Core Reminder form on chart Outcome: Completed/Met Date Met:  10/25/14 EF 40%.

## 2014-10-25 NOTE — Progress Notes (Signed)
  Echocardiogram 2D Echocardiogram has been performed.  [name] 10/25/2014, 10:59 AM

## 2014-10-26 DIAGNOSIS — R7989 Other specified abnormal findings of blood chemistry: Secondary | ICD-10-CM

## 2014-10-26 DIAGNOSIS — I25111 Atherosclerotic heart disease of native coronary artery with angina pectoris with documented spasm: Secondary | ICD-10-CM

## 2014-10-26 LAB — BASIC METABOLIC PANEL
Anion gap: 15 (ref 5–15)
BUN: 53 mg/dL — AB (ref 6–20)
CALCIUM: 9.1 mg/dL (ref 8.9–10.3)
CHLORIDE: 94 mmol/L — AB (ref 101–111)
CO2: 25 mmol/L (ref 22–32)
CREATININE: 7.64 mg/dL — AB (ref 0.44–1.00)
GFR calc Af Amer: 6 mL/min — ABNORMAL LOW (ref >60)
GFR calc non Af Amer: 5 mL/min — ABNORMAL LOW (ref >60)
GLUCOSE: 163 mg/dL — AB (ref 65–99)
Potassium: 3.7 mmol/L (ref 3.5–5.1)
Sodium: 134 mmol/L — ABNORMAL LOW (ref 135–145)

## 2014-10-26 LAB — HEMOGLOBIN A1C
HEMOGLOBIN A1C: 6.5 % — AB (ref 4.8–5.6)
Mean Plasma Glucose: 140 mg/dL

## 2014-10-26 LAB — CBC
HCT: 33.2 % — ABNORMAL LOW (ref 36.0–46.0)
Hemoglobin: 10.4 g/dL — ABNORMAL LOW (ref 12.0–15.0)
MCH: 27.9 pg (ref 26.0–34.0)
MCHC: 31.3 g/dL (ref 30.0–36.0)
MCV: 89 fL (ref 78.0–100.0)
PLATELETS: 271 10*3/uL (ref 150–400)
RBC: 3.73 MIL/uL — ABNORMAL LOW (ref 3.87–5.11)
RDW: 15.9 % — ABNORMAL HIGH (ref 11.5–15.5)
WBC: 8.8 10*3/uL (ref 4.0–10.5)

## 2014-10-26 LAB — GLUCOSE, CAPILLARY: Glucose-Capillary: 148 mg/dL — ABNORMAL HIGH (ref 65–99)

## 2014-10-26 MED ORDER — ASPIRIN 81 MG PO CHEW
81.0000 mg | CHEWABLE_TABLET | ORAL | Status: AC
  Administered 2014-10-27: 81 mg via ORAL
  Filled 2014-10-26: qty 1

## 2014-10-26 MED ORDER — SODIUM CHLORIDE 0.9 % IV SOLN: INTRAVENOUS | Status: DC

## 2014-10-26 MED ORDER — SODIUM CHLORIDE 0.9 % IJ SOLN: 3.0000 mL | INTRAMUSCULAR | Status: DC | PRN

## 2014-10-26 MED ORDER — SODIUM CHLORIDE 0.9 % IJ SOLN
3.0000 mL | Freq: Two times a day (BID) | INTRAMUSCULAR | Status: DC
  Administered 2014-10-27: 3 mL via INTRAVENOUS

## 2014-10-26 MED ORDER — SODIUM CHLORIDE 0.9 % IV SOLN
250.0000 mL | INTRAVENOUS | Status: DC | PRN
Start: 2014-10-26 — End: 2014-10-27

## 2014-10-26 NOTE — Progress Notes (Signed)
Patient Name: [name] Date of Encounter: 10/26/2014     Principal Problem:   Acute respiratory failure with hypoxia ([place]) Active Problems:   LBBB (left bundle branch block)   ESRD on dialysis ([place])   Shortness of breath   IDDM (insulin dependent diabetes mellitus) (HCC)   Elevated troponin   Congestive heart failure (CHF) ([place])   Acute hypoxemic respiratory failure ([place])    SUBJECTIVE  The patient feels well today.   Was examined in HD. No CP , no dyspnea.    CURRENT MEDS . amiodarone  100 mg Oral Daily  . antiseptic oral rinse  7 mL Mouth Rinse BID  . aspirin  325 mg Oral Daily  . atorvastatin  40 mg Oral q1800  . calcitRIOL  1.25 mcg Oral Q M,W,F-HD  . calcium acetate  2,001 mg Oral TID WC  . dorzolamide-timolol  1 drop Left Eye TID AC & HS  . enoxaparin (LOVENOX) injection  30 mg Subcutaneous Daily  . metoprolol  50 mg Oral Once per day on Sun Tue Thu Sat  . mometasone-formoterol  2 puff Inhalation BID  . prednisoLONE acetate  1 drop Left Eye TID AC & HS  . sodium chloride  3 mL Intravenous Q12H    OBJECTIVE  Filed Vitals:   10/26/14 0835 10/26/14 0845 10/26/14 0900 10/26/14 0930  BP: 156/78 165/73 159/89 155/76  Pulse: 88 85 90 88  Temp: 97.6 F (36.4 C)     TempSrc: Oral     Resp: 18 17 15 25   Height:      Weight: 90.4 kg (199 lb 4.7 oz)     SpO2: 96%       Intake/Output Summary (Last 24 hours) at 10/26/14 1000 Last data filed at 10/26/14 0600  Gross per 24 hour  Intake    120 ml  Output      0 ml  Net    120 ml   Filed Weights   10/24/14 2051 10/25/14 2035 10/26/14 0835  Weight: 90.719 kg (200 lb) 92.08 kg (203 lb) 90.4 kg (199 lb 4.7 oz)    PHYSICAL EXAM  General: Pleasant, NAD. Neuro: Alert and oriented X 3. Moves all extremities spontaneously. Psych: Normal affect. HEENT:  Normal  Neck: Supple without bruits or JVD. Lungs:  Resp regular and unlabored, CTA. Heart: RRR no s3, s4.  There is a grade 2-4/0 systolic ejection  murmur at the aortic area. Abdomen: Soft, non-tender, non-distended.  Colostomy and urostomy sites are unremarkable.  Extremities: No peripheral edema  Accessory Clinical Findings  CBC  Recent Labs  10/23/14 1327  10/25/14 0545 10/26/14 0521  WBC 11.2*  < > 8.7 8.8  NEUTROABS 9.1*  --   --   --   HGB 10.7*  < > 9.6* 10.4*  HCT 34.1*  < > 30.2* 33.2*  MCV 90.5  < > 89.3 89.0  PLT 165  < > 255 271  < > = values in this interval not displayed. Basic Metabolic Panel  Recent Labs  10/25/14 0545 10/26/14 0521  NA 134* 134*  K 5.1 3.7  CL 94* 94*  CO2 26 25  GLUCOSE 134* 163*  BUN 34* 53*  CREATININE 5.89* 7.64*  CALCIUM 9.0 9.1   Liver Function Tests No results for input(s): AST, ALT, ALKPHOS, BILITOT, PROT, ALBUMIN in the last 72 hours. No results for input(s): LIPASE, AMYLASE in the last 72 hours. Cardiac Enzymes  Recent Labs  10/24/14 0116 10/24/14 0519 10/24/14  1200  TROPONINI 1.90* 3.07* 2.92*   BNP Invalid input(s): POCBNP D-Dimer No results for input(s): DDIMER in the last 72 hours. Hemoglobin A1C No results for input(s): HGBA1C in the last 72 hours. Fasting Lipid Panel  Recent Labs  10/25/14 0545  CHOL 166  HDL 27*  LDLCALC 98  TRIG 205*  CHOLHDL 6.1   Thyroid Function Tests  Recent Labs  10/24/14 0116  TSH 0.830    TELE  Normal sinus rhythm  ECG    Radiology/Studies  Dg Chest 2 View  10/23/2014  CLINICAL DATA:  Shortness of Breath EXAM: CHEST  2 VIEW COMPARISON:  September 22, 2013 FINDINGS: There is mild generalized interstitial edema. There is cardiomegaly with pulmonary venous hypertension. No airspace consolidation. There is a minimal right pleural effusion. No adenopathy. No bone lesions. There is atherosclerotic change in the aorta. IMPRESSION: Findings consistent with a degree of congestive heart failure. No airspace consolidation. Electronically Signed   By: Lowella Grip III M.D.   On: 10/23/2014 12:55     ASSESSMENT AND PLAN 1. Elevated Troponin Has known moderate CAD. Her presentation was c/w demand ischemia.  She is much better after dialysis . Her EF has dropped to 35%. I think she needs a cath . Will schedule for tomorrow with Dr. Ellyn Hack .   2. ESRD:  Per nephrololgy .  Discussed the cath with Dr.  Jonnie Finner.    Yolani Vo, Wonda Cheng, MD  10/26/2014 10:29 AM    [facility] [place],  [place], [place]  [address] Pager [phone] Phone: [phone]; Fax: [phone]   [facility]  [address] [place], [place]  [address] [phone]   Fax [phone]

## 2014-10-26 NOTE — Progress Notes (Signed)
Subjective:    Objective Vital signs in last 24 hours: Filed Vitals:   10/26/14 0510 10/26/14 0835 10/26/14 0845 10/26/14 0900  BP: 141/64 156/78 165/73 159/89  Pulse: 91 88 85 90  Temp: 98.2 F (36.8 C) 97.6 F (36.4 C)    TempSrc: Oral Oral    Resp: 18 18 17 15   Height:      Weight:  90.4 kg (199 lb 4.7 oz)    SpO2: 96% 96%     Weight change: 1.361 kg (3 lb)  Physical Exam: General:  NAD, OX3 , appropriate  Lungs:  Slightly Decr .  BS throughout  . Breathing  unlabored Heart: RRR no mur / gallop/  Or rub  Abdomen: Obese Soft, non-tender, non-distended with ostomy . Extremities: without pedal  edema / right transmet well healed Dialysis Access:  L LA  AVF + bruit  HD: [place] MWF  4h  F160   LUA AVF   Hep 3000  3/2.25 bath  91kg (leaving under dry wt) Mircera 75 q 2 weeks - given 10/12 Calcitriol 1.25 Labs: Hgb 11.4 10/12 27% sat in Sept iPTH 223 K 3.7 - 3.9 on 3 K bath Ca ok P +/- 5.5 on 4 phoslo ac  Assessment: 1. SOB / vol^ / pulm edema - lean body wt loss, lowering dry wt 2. Positive troponins - card eval in process 2 d echo done- pending results 3. ESRD HD mwf 4. Hypertension on MTP only  5. Anemia - Hgb 10.6 ---> 9.6  had Mircera 75 10/12 - no ESA due until 10/26 6. Metabolic bone disease - Ca 8.8 - / calcitriol 1.25  On HD  on 4 phoslo ac tid 7. Nutrition - needs renal diet/vit 8. Diabetes - per primary 9. Atrial tachy cardia - on BB on non HD days and amiodarone - followed by Dr. Caryl Comes 10. Dispo - ok for d/c after HD today from renal standpoint  Plan - HD again today, lowering volume and get to new dry wt  Kelly Splinter MD [place] pager [phone]    cell [phone] 10/26/2014, 9:23 AM         Labs: Basic Metabolic Panel:  Recent Labs Lab 10/24/14 0519 10/25/14 0545 10/26/14 0521  NA 140 134* 134*  K 3.3* 5.1 3.7  CL 98* 94* 94*  CO2 29 26 25   GLUCOSE 132* 134* 163*  BUN 14 34* 53*  CREATININE 4.10* 5.89* 7.64*   CALCIUM 8.8* 9.0 9.1   Liver Function Tests: No results for input(s): AST, ALT, ALKPHOS, BILITOT, PROT, ALBUMIN in the last 168 hours. No results for input(s): LIPASE, AMYLASE in the last 168 hours. No results for input(s): AMMONIA in the last 168 hours. CBC:  Recent Labs Lab 10/23/14 1327 10/24/14 0519 10/25/14 0545 10/26/14 0521  WBC 11.2* 7.7 8.7 8.8  NEUTROABS 9.1*  --   --   --   HGB 10.7* 10.6* 9.6* 10.4*  HCT 34.1* 34.4* 30.2* 33.2*  MCV 90.5 90.8 89.3 89.0  PLT 165 226 255 271   Cardiac Enzymes:  Recent Labs Lab 10/24/14 0116 10/24/14 0519 10/24/14 1200  TROPONINI 1.90* 3.07* 2.92*   CBG:  Recent Labs Lab 10/23/14 2323 10/24/14 0423 10/24/14 1633  GLUCAP 139* 132* 163*    Studies/Results: No results found. Medications: . sodium chloride     . amiodarone  100 mg Oral Daily  . antiseptic oral rinse  7 mL Mouth Rinse BID  . aspirin  325 mg Oral Daily  .  atorvastatin  40 mg Oral q1800  . calcitRIOL  1.25 mcg Oral Q M,W,F-HD  . calcium acetate  2,001 mg Oral TID WC  . dorzolamide-timolol  1 drop Left Eye TID AC & HS  . enoxaparin (LOVENOX) injection  30 mg Subcutaneous Daily  . metoprolol  50 mg Oral Once per day on Sun Tue Thu Sat  . mometasone-formoterol  2 puff Inhalation BID  . prednisoLONE acetate  1 drop Left Eye TID AC & HS  . sodium chloride  3 mL Intravenous Q12H

## 2014-10-26 NOTE — Care Management Important Message (Signed)
Important Message  Patient Details  Name: TAJE TONDREAU MRN: [id number] Date of Birth: 1945-01-28   Medicare Important Message Given:  Yes-second notification given    Delorse Lek 10/26/2014, 1:41 PM

## 2014-10-26 NOTE — Progress Notes (Signed)
[facility] PROGRESS NOTE   SYDELL PROWELL WJX:[id number] DOB: 02-07-1945 DOA: 10/23/2014 PCP: Walker Kehr, MD  HPI/Subjective: Seen by cardiology, recommended cardiac catheterization probably tomorrow. Denies any complaints for today.  Assessment/Plan: Principal Problem:   Acute respiratory failure with hypoxia (HCC) Active Problems:   LBBB (left bundle branch block)   ESRD on dialysis ([place])   Shortness of breath   IDDM (insulin dependent diabetes mellitus) (HCC)   Elevated troponin   Congestive heart failure (CHF) ([place])   Acute hypoxemic respiratory failure ([place])   Acute hypoxic respiratory failure Patient presented to the hospital with shortness of breath, CXR finding of mild pulmonary edema and O2 sats of 87% on RA. Findings are secondary to congestive heart failure, likely acute on chronic diastolic.[name] Placed initially on O2 via nasal cannula, still short of breath, placed on BiPAP for brief period of time. This is resolved.  Non-STEMI Initially troponin was slightly elevated at 0.1, patient denies any chest pain troponin increased to 3.07.  Patient is on aspirin and beta blockers, continue. 2-D echo to rule out regional WMA. Cardiology consulted, this is likely not ACS but the high troponin qualifies her for non-STEMI. Patient is on aspirin and beta blockers, continue.  Seen by cardiology, 2-D echo showed drop of LVEF from 55% to 35% cardiac catheterization in the morning.  Dyslipidemia LDL is 98 and total cholesterol is 166.  Congestive heart failure Patient has history of diastolic congestive heart failure, chest x-ray showed pulmonary edema. 2-D echo to be done, last echo was in January 2015. Showed LVEF of 55-60%  ESRD Dialysis days Monday, Wednesday and Friday, with evidence of pulmonary edema on x-ray. Nephrology already notified and so the patient, for dialysis later today.  Diabetes mellitus type 2 Last A1c 7.4 from January 2016, patient is  been on Prandin, hold. Carbohydrate modified diet and insulin sliding scale, check A1c again.  LBBB Has old left bundle branch block on her EKG, no changes since last study.  Code Status: Full Code Family Communication: Plan discussed with the patient. Disposition Plan: Remains inpatient Diet: Diet renal/carb modified with fluid restriction Diet-HS Snack?: Nothing; Room service appropriate?: Yes; Fluid consistency:: Thin  Consultants:   cardiology.  Nephrology   Procedures:  Hemodialysis   Antibiotics:  None    Objective: Filed Vitals:   10/26/14 1253  BP: 129/51  Pulse: 89  Temp: 98.1 F (36.7 C)  Resp: 19    Intake/Output Summary (Last 24 hours) at 10/26/14 1609 Last data filed at 10/26/14 1253  Gross per 24 hour  Intake      0 ml  Output   3000 ml  Net  -3000 ml   Filed Weights   10/24/14 2051 10/25/14 2035 10/26/14 0835  Weight: 90.719 kg (200 lb) 92.08 kg (203 lb) 90.4 kg (199 lb 4.7 oz)    Exam: General: Alert and awake, oriented x3, not in any acute distress. HEENT: anicteric sclera, pupils reactive to light and accommodation, EOMI CVS: S1-S2 clear, no murmur rubs or gallops Chest: clear to auscultation bilaterally, no wheezing, rales or rhonchi Abdomen: soft nontender, nondistended, normal bowel sounds, no organomegaly Extremities: no cyanosis, clubbing or edema noted bilaterally Neuro: Cranial nerves II-XII intact, no focal neurological deficits  Data Reviewed: Basic Metabolic Panel:  Recent Labs Lab 10/23/14 1327 10/24/14 0519 10/25/14 0545 10/26/14 0521  NA 138 140 134* 134*  K 4.1 3.3* 5.1 3.7  CL 98* 98* 94* 94*  CO2 24 29 26 25   GLUCOSE 162*  132* 134* 163*  BUN 31* 14 34* 53*  CREATININE 6.87* 4.10* 5.89* 7.64*  CALCIUM 8.6* 8.8* 9.0 9.1   Liver Function Tests: No results for input(s): AST, ALT, ALKPHOS, BILITOT, PROT, ALBUMIN in the last 168 hours. No results for input(s): LIPASE, AMYLASE in the last 168 hours. No results  for input(s): AMMONIA in the last 168 hours. CBC:  Recent Labs Lab 10/23/14 1327 10/24/14 0519 10/25/14 0545 10/26/14 0521  WBC 11.2* 7.7 8.7 8.8  NEUTROABS 9.1*  --   --   --   HGB 10.7* 10.6* 9.6* 10.4*  HCT 34.1* 34.4* 30.2* 33.2*  MCV 90.5 90.8 89.3 89.0  PLT 165 226 255 271   Cardiac Enzymes:  Recent Labs Lab 10/24/14 0116 10/24/14 0519 10/24/14 1200  TROPONINI 1.90* 3.07* 2.92*   BNP (last 3 results)  Recent Labs  10/23/14 1327  BNP 1300.0*    ProBNP (last 3 results) No results for input(s): PROBNP in the last 8760 hours.  CBG:  Recent Labs Lab 10/23/14 2323 10/24/14 0423 10/24/14 1633  GLUCAP 139* 132* 163*    Micro Recent Results (from the past 240 hour(s))  MRSA PCR Screening     Status: None   Collection Time: 10/23/14 11:43 PM  Result Value Ref Range Status   MRSA by PCR NEGATIVE NEGATIVE Final    Comment:        The GeneXpert MRSA Assay (FDA approved for NASAL specimens only), is one component of a comprehensive MRSA colonization surveillance program. It is not intended to diagnose MRSA infection nor to guide or monitor treatment for MRSA infections.      Studies: No results found.  Scheduled Meds: . amiodarone  100 mg Oral Daily  . antiseptic oral rinse  7 mL Mouth Rinse BID  . aspirin  325 mg Oral Daily  . atorvastatin  40 mg Oral q1800  . calcitRIOL  1.25 mcg Oral Q M,W,F-HD  . calcium acetate  2,001 mg Oral TID WC  . dorzolamide-timolol  1 drop Left Eye TID AC & HS  . enoxaparin (LOVENOX) injection  30 mg Subcutaneous Daily  . metoprolol  50 mg Oral Once per day on Sun Tue Thu Sat  . mometasone-formoterol  2 puff Inhalation BID  . prednisoLONE acetate  1 drop Left Eye TID AC & HS  . sodium chloride  3 mL Intravenous Q12H   Continuous Infusions: . sodium chloride         Time spent: 35 minutes    Marda Breidenbach A  [facility] Pager [phone] If 7PM-7AM, please contact night-coverage at [email or web address],  password [facility] 10/26/2014, 4:09 PM  LOS: 3 days

## 2014-10-27 ENCOUNTER — Encounter (HOSPITAL_COMMUNITY)
Admission: EM | Disposition: A | Discharge status: Home / Self Care | Payer: Self-pay | Source: Home / Self Care | Attending: Internal Medicine

## 2014-10-27 ENCOUNTER — Encounter (HOSPITAL_COMMUNITY): Payer: Self-pay | Admitting: Cardiology

## 2014-10-27 DIAGNOSIS — I214 Non-ST elevation (NSTEMI) myocardial infarction: Secondary | ICD-10-CM | POA: Diagnosis present

## 2014-10-27 DIAGNOSIS — I2510 Atherosclerotic heart disease of native coronary artery without angina pectoris: Secondary | ICD-10-CM

## 2014-10-27 DIAGNOSIS — I5022 Chronic systolic (congestive) heart failure: Secondary | ICD-10-CM

## 2014-10-27 HISTORY — PX: CARDIAC CATHETERIZATION: SHX172

## 2014-10-27 LAB — BASIC METABOLIC PANEL
Anion gap: 11 (ref 5–15)
BUN: 28 mg/dL — ABNORMAL HIGH (ref 6–20)
CHLORIDE: 99 mmol/L — AB (ref 101–111)
CO2: 27 mmol/L (ref 22–32)
Calcium: 8.8 mg/dL — ABNORMAL LOW (ref 8.9–10.3)
Creatinine, Ser: 5.55 mg/dL — ABNORMAL HIGH (ref 0.44–1.00)
GFR calc non Af Amer: 7 mL/min — ABNORMAL LOW (ref >60)
GFR, EST AFRICAN AMERICAN: 8 mL/min — AB (ref >60)
Glucose, Bld: 168 mg/dL — ABNORMAL HIGH (ref 65–99)
POTASSIUM: 4.2 mmol/L (ref 3.5–5.1)
SODIUM: 137 mmol/L (ref 135–145)

## 2014-10-27 LAB — CBC
HEMATOCRIT: 36.7 % (ref 36.0–46.0)
HEMOGLOBIN: 11.5 g/dL — AB (ref 12.0–15.0)
MCH: 28.5 pg (ref 26.0–34.0)
MCHC: 31.3 g/dL (ref 30.0–36.0)
MCV: 90.8 fL (ref 78.0–100.0)
Platelets: 269 10*3/uL (ref 150–400)
RBC: 4.04 MIL/uL (ref 3.87–5.11)
RDW: 16.1 % — ABNORMAL HIGH (ref 11.5–15.5)
WBC: 7.2 10*3/uL (ref 4.0–10.5)

## 2014-10-27 LAB — GLUCOSE, CAPILLARY
GLUCOSE-CAPILLARY: 281 mg/dL — AB (ref 65–99)
GLUCOSE-CAPILLARY: 257 mg/dL — AB (ref 65–99)
Glucose-Capillary: 146 mg/dL — ABNORMAL HIGH (ref 65–99)
Glucose-Capillary: 138 mg/dL — ABNORMAL HIGH (ref 65–99)
Glucose-Capillary: 192 mg/dL — ABNORMAL HIGH (ref 65–99)

## 2014-10-27 SURGERY — LEFT HEART CATH AND CORONARY ANGIOGRAPHY

## 2014-10-27 MED ORDER — SODIUM CHLORIDE 0.9 % IJ SOLN: 3.0000 mL | INTRAMUSCULAR | Status: DC | PRN

## 2014-10-27 MED ORDER — INSULIN ASPART 100 UNIT/ML ~~LOC~~ SOLN
0.0000 [IU] | Freq: Three times a day (TID) | SUBCUTANEOUS | Status: DC
  Administered 2014-10-27 – 2014-10-29 (×2): 8 [IU] via SUBCUTANEOUS

## 2014-10-27 MED ORDER — FAMOTIDINE IN NACL 20-0.9 MG/50ML-% IV SOLN
20.0000 mg | Freq: Once | INTRAVENOUS | Status: DC
  Filled 2014-10-27: qty 50

## 2014-10-27 MED ORDER — SODIUM CHLORIDE 0.9 % IV SOLN: 250.0000 mL | INTRAVENOUS | Status: DC | PRN

## 2014-10-27 MED ORDER — FENTANYL CITRATE (PF) 100 MCG/2ML IJ SOLN
INTRAMUSCULAR | Status: AC
  Filled 2014-10-27: qty 4

## 2014-10-27 MED ORDER — MIDAZOLAM HCL 2 MG/2ML IJ SOLN
INTRAMUSCULAR | Status: AC
  Filled 2014-10-27: qty 4

## 2014-10-27 MED ORDER — MIDAZOLAM HCL 2 MG/2ML IJ SOLN
INTRAMUSCULAR | Status: DC | PRN
  Administered 2014-10-27: 1 mg via INTRAVENOUS

## 2014-10-27 MED ORDER — LIDOCAINE HCL (PF) 1 % IJ SOLN
INTRAMUSCULAR | Status: AC
  Filled 2014-10-27: qty 30

## 2014-10-27 MED ORDER — NITROGLYCERIN 1 MG/10 ML FOR IR/CATH LAB
INTRA_ARTERIAL | Status: DC | PRN
  Administered 2014-10-27: 200 ug via INTRACORONARY

## 2014-10-27 MED ORDER — NITROGLYCERIN 1 MG/10 ML FOR IR/CATH LAB
INTRA_ARTERIAL | Status: AC
  Filled 2014-10-27: qty 10

## 2014-10-27 MED ORDER — HEPARIN (PORCINE) IN NACL 2-0.9 UNIT/ML-% IJ SOLN
INTRAMUSCULAR | Status: AC
  Filled 2014-10-27: qty 1000

## 2014-10-27 MED ORDER — SODIUM CHLORIDE 0.9 % IJ SOLN: 3.0000 mL | Freq: Two times a day (BID) | INTRAMUSCULAR | Status: DC

## 2014-10-27 MED ORDER — DIPHENHYDRAMINE HCL 50 MG/ML IJ SOLN
25.0000 mg | Freq: Once | INTRAMUSCULAR | Status: AC
  Administered 2014-10-27: 25 mg via INTRAVENOUS
  Filled 2014-10-27: qty 1

## 2014-10-27 MED ORDER — METHYLPREDNISOLONE SODIUM SUCC 125 MG IJ SOLR
125.0000 mg | Freq: Once | INTRAMUSCULAR | Status: AC
  Administered 2014-10-27: 125 mg via INTRAVENOUS
  Filled 2014-10-27: qty 2

## 2014-10-27 MED ORDER — IOHEXOL 350 MG/ML SOLN
INTRAVENOUS | Status: DC | PRN
  Administered 2014-10-27: 80 mL via INTRAVENOUS

## 2014-10-27 MED ORDER — NITROGLYCERIN 1 MG/10 ML FOR IR/CATH LAB
INTRA_ARTERIAL | Status: DC | PRN
  Administered 2014-10-27: 12:00:00

## 2014-10-27 MED ORDER — HEPARIN (PORCINE) IN NACL 100-0.45 UNIT/ML-% IJ SOLN
1050.0000 [IU]/h | INTRAMUSCULAR | Status: DC
  Administered 2014-10-27: 800 [IU]/h via INTRAVENOUS
  Filled 2014-10-27: qty 250

## 2014-10-27 MED ORDER — FENTANYL CITRATE (PF) 100 MCG/2ML IJ SOLN
INTRAMUSCULAR | Status: DC | PRN
  Administered 2014-10-27: 25 ug via INTRAVENOUS

## 2014-10-27 SURGICAL SUPPLY — 7 items
CATH INFINITI 5FR MULTPACK ANG (CATHETERS) ×3 IMPLANT
KIT HEART LEFT (KITS) ×3 IMPLANT
PACK CARDIAC CATHETERIZATION (CUSTOM PROCEDURE TRAY) ×3 IMPLANT
SHEATH PINNACLE 5F 10CM (SHEATH) ×3 IMPLANT
TRANSDUCER W/STOPCOCK (MISCELLANEOUS) ×3 IMPLANT
WIRE EMERALD 3MM-J .035X150CM (WIRE) ×3 IMPLANT
WIRE HI TORQ VERSACORE-J 145CM (WIRE) ×3 IMPLANT

## 2014-10-27 NOTE — Progress Notes (Signed)
Attempted to start a 2nd IV for cath this am, unsuccessful. IV team notified and they were unable to get 2nd site as well. Patient states she does not want to sign a consent until she speaks more with the doctor, and refused to watch video on caths, stated she has had a few in the past. Unable to locate surgical razor to clip groin, one was ordered and awaiting arrival to to right groin and rt radial clip. [facility] [employer]

## 2014-10-27 NOTE — Progress Notes (Signed)
[facility] PROGRESS NOTE   [name] YQM:[id number] DOB: December 04, 1945 DOA: 10/23/2014 PCP: Walker Kehr, MD  HPI/Subjective: No shortness of breath, no chest pain.  Brief hospital summary: 69 year old came in to the hospital complaining about shortness of breath, she has ESRD but never missed dialysis. Her SOB improved after HD, she was also found to have troponin up to 3.0, cardiology consulted and there is thinking this is likely demand ischemia. Her 2-D echo showed drop off the LVEF from 55% to 35%, cardiac catheterization to be done today. If patient okay might be discharged in a.m. after dialysis.  Assessment/Plan: Principal Problem:   Acute respiratory failure with hypoxia (HCC) Active Problems:   LBBB (left bundle branch block)   ESRD on dialysis ([place])   Shortness of breath   IDDM (insulin dependent diabetes mellitus) (HCC)   Elevated troponin   Congestive heart failure (CHF) ([place])   Acute hypoxemic respiratory failure ([place])   Acute hypoxic respiratory failure Patient presented to the hospital with shortness of breath, CXR finding of mild pulmonary edema and O2 sats of 87% on RA. Findings are secondary to congestive heart failure, likely acute on chronic diastolic.[name] Placed initially on O2 via nasal cannula, still short of breath, placed on BiPAP for brief period of time. This is resolved.  Non-STEMI Initially troponin was slightly elevated at 0.1, patient denies any chest pain troponin increased to 3.07.  Patient is on aspirin and beta blockers, continue. 2-D echo to rule out regional WMA. Cardiology consulted, this is likely not ACS but the high troponin qualifies her for non-STEMI. Patient is on aspirin and beta blockers, continue.  Seen by cardiology, 2-D echo showed drop of LVEF from 55% to 35% (also she has old LBBB), for cardiac catheterization today.  Dyslipidemia LDL is 98 and total cholesterol is 166.  Congestive heart failure Patient has  history of diastolic congestive heart failure, chest x-ray showed pulmonary edema. 2-D echo to be done, last echo was in January 2015. Showed LVEF of 55-60%  ESRD Dialysis days Monday, Wednesday and Friday, with evidence of pulmonary edema on x-ray. Nephrology already notified and so the patient, for dialysis later today. Wonder if the patient is under dialyzed, she never missed dialysis per report.  Diabetes mellitus type 2 Last A1c 7.4 from January 2016, patient is been on Prandin, hold. Carbohydrate modified diet and insulin sliding scale, A1c is 6.5 indicating good control of diabetes mellitus.  LBBB Has old left bundle branch block on her EKG, no changes since last study.  Code Status: Full Code Family Communication: Plan discussed with the patient. Disposition Plan: Remains inpatient Diet: Diet NPO time specified Except for: Sips with Meds  Consultants:   cardiology.  Nephrology   Procedures:  Hemodialysis   Antibiotics:  None    Objective: Filed Vitals:   10/27/14 0853  BP:   Pulse: 82  Temp:   Resp: 20    Intake/Output Summary (Last 24 hours) at 10/27/14 1139 Last data filed at 10/26/14 1253  Gross per 24 hour  Intake      0 ml  Output   3000 ml  Net  -3000 ml   Filed Weights   10/25/14 2035 10/26/14 0835 10/26/14 2137  Weight: 92.08 kg (203 lb) 90.4 kg (199 lb 4.7 oz) 87.544 kg (193 lb)    Exam: General: Alert and awake, oriented x3, not in any acute distress. HEENT: anicteric sclera, pupils reactive to light and accommodation, EOMI CVS: S1-S2 clear, no murmur rubs  or gallops Chest: clear to auscultation bilaterally, no wheezing, rales or rhonchi Abdomen: soft nontender, nondistended, normal bowel sounds, no organomegaly Extremities: no cyanosis, clubbing or edema noted bilaterally Neuro: Cranial nerves II-XII intact, no focal neurological deficits  Data Reviewed: Basic Metabolic Panel:  Recent Labs Lab 10/23/14 1327 10/24/14 0519  10/25/14 0545 10/26/14 0521 10/27/14 0351  NA 138 140 134* 134* 137  K 4.1 3.3* 5.1 3.7 4.2  CL 98* 98* 94* 94* 99*  CO2 24 29 26 25 27   GLUCOSE 162* 132* 134* 163* 168*  BUN 31* 14 34* 53* 28*  CREATININE 6.87* 4.10* 5.89* 7.64* 5.55*  CALCIUM 8.6* 8.8* 9.0 9.1 8.8*   Liver Function Tests: No results for input(s): AST, ALT, ALKPHOS, BILITOT, PROT, ALBUMIN in the last 168 hours. No results for input(s): LIPASE, AMYLASE in the last 168 hours. No results for input(s): AMMONIA in the last 168 hours. CBC:  Recent Labs Lab 10/23/14 1327 10/24/14 0519 10/25/14 0545 10/26/14 0521 10/27/14 0351  WBC 11.2* 7.7 8.7 8.8 7.2  NEUTROABS 9.1*  --   --   --   --   HGB 10.7* 10.6* 9.6* 10.4* 11.5*  HCT 34.1* 34.4* 30.2* 33.2* 36.7  MCV 90.5 90.8 89.3 89.0 90.8  PLT 165 226 255 271 269   Cardiac Enzymes:  Recent Labs Lab 10/24/14 0116 10/24/14 0519 10/24/14 1200  TROPONINI 1.90* 3.07* 2.92*   BNP (last 3 results)  Recent Labs  10/23/14 1327  BNP 1300.0*    ProBNP (last 3 results) No results for input(s): PROBNP in the last 8760 hours.  CBG:  Recent Labs Lab 10/23/14 2323 10/24/14 0423 10/24/14 1633 10/26/14 1717 10/27/14 0809  GLUCAP 139* 132* 163* 148* 146*    Micro Recent Results (from the past 240 hour(s))  MRSA PCR Screening     Status: None   Collection Time: 10/23/14 11:43 PM  Result Value Ref Range Status   MRSA by PCR NEGATIVE NEGATIVE Final    Comment:        The GeneXpert MRSA Assay (FDA approved for NASAL specimens only), is one component of a comprehensive MRSA colonization surveillance program. It is not intended to diagnose MRSA infection nor to guide or monitor treatment for MRSA infections.      Studies: No results found.  Scheduled Meds: . amiodarone  100 mg Oral Daily  . antiseptic oral rinse  7 mL Mouth Rinse BID  . aspirin  325 mg Oral Daily  . atorvastatin  40 mg Oral q1800  . calcitRIOL  1.25 mcg Oral Q M,W,F-HD  .  calcium acetate  2,001 mg Oral TID WC  . dorzolamide-timolol  1 drop Left Eye TID AC & HS  . enoxaparin (LOVENOX) injection  30 mg Subcutaneous Daily  . famotidine  20 mg Intravenous Once  . metoprolol  50 mg Oral Once per day on Sun Tue Thu Sat  . mometasone-formoterol  2 puff Inhalation BID  . prednisoLONE acetate  1 drop Left Eye TID AC & HS  . sodium chloride  3 mL Intravenous Q12H  . sodium chloride  3 mL Intravenous Q12H   Continuous Infusions: . sodium chloride    . sodium chloride         Time spent: 35 minutes    [facility] A  [facility] Pager [phone] If 7PM-7AM, please contact night-coverage at [email or web address], password [facility] 10/27/2014, 11:39 AM  LOS: 4 days

## 2014-10-27 NOTE — Progress Notes (Signed)
ANTICOAGULATION CONSULT NOTE - Initial Consult  Pharmacy Consult for heparin Indication: chest pain/ACS  Allergies  Allergen Reactions  . Ace Inhibitors Cough  . Eggs Or Egg-Derived Products Nausea And Vomiting  . Enalapril Cough  . Lisinopril Cough  . Omnipaque [Iohexol] Hives    Patient Measurements: Height: 5' (152.4 cm) Weight: 193 lb (87.544 kg) IBW/kg (Calculated) : 45.5 Heparin Dosing Weight: 65kg  Vital Signs: BP: 131/101 mmHg (10/18 1200) Pulse Rate: 70 (10/18 1200)  Labs:  Recent Labs  10/25/14 0545 10/26/14 0521 10/27/14 0351  HGB 9.6* 10.4* 11.5*  HCT 30.2* 33.2* 36.7  PLT 255 271 269  CREATININE 5.89* 7.64* 5.55*    Estimated Creatinine Clearance: 9.5 mL/min (by C-G formula based on Cr of 5.55).  Assessment: 67 YOF admitted with CP now s/p cath to start heparin 8h after sheath removal. Sheath out at 1144. Was on Lovenox 30mg  subq daily prior to cath. Hgb 11.5, plts 269- no overt bleeding noted during cath or at other times.  Goal of Therapy:  Heparin level 0.3-0.7 units/ml Monitor platelets by anticoagulation protocol: Yes   Plan:  -stop Lovenox -start heparin at 2000 tonight a rate of 800 units/hr -first HL with AM labs on 10/19 -daily HL and CBC  Rickey Farrier D. [place], PharmD, BCPS Clinical Pharmacist Pager: [phone] 10/27/2014 12:18 PM

## 2014-10-27 NOTE — Progress Notes (Signed)
Subjective:  Awoken from sleep in room sp cardiac cath no co  Objective Vital signs in last 24 hours: Filed Vitals:   10/27/14 1215 10/27/14 1220 10/27/14 1225 10/27/14 1230  BP: 136/96 149/56 121/79 135/71  Pulse: 70 70 70 70  Temp:      TempSrc:      Resp: 16 16 18 16   Height:      Weight:      SpO2: 94% 93% 96% 97%   Weight change: -1.68 kg (-3 lb 11.3 oz)  Physical Exam:  General:   NAD, OX3 , appropriate  Lungs: Slightly Decr . BS throughout . Breathing unlabored Heart: RRR no mur / gallop/ Or rub  Abdomen: Obese Soft, non-tender, non-distended with ostomy bag  in place . Extremities:  No pedal edema / right transmet well healed Dialysis Access: L LA AVF + bruit   OP HD: [facility] M[facility] 4h F160 LUA AVF Hep 3000 3/2.25 bath 91kg (leaving under dry wt) Mircera 75 q 2 weeks - given 10/12 Calcitriol 1.25 Labs: Hgb 11.4 10/12 27% sat in Sept iPTH 223 K 3.7 - 3.9 on 3 K bath Ca ok P +/- 5.5 on 4 phoslo ac  Problem/Plan: 1. SOB / vol^ / pulm edema - lean body wt loss, lowering dry wt/ ? Cardiac component with decr EF /  Card . cath results pending from AM 2. Positive troponins - card cath pending results/ card RX 3. ESRD HD mwf on schedule  4/2 k  4. Hypertension on MTP only  5. Anemia - Hgb 10.6 ---> 9.6 > 11.2 had Mircera 75 10/12 -  And due next week ,no esa in hosp  So far with stable hgb  6. Metabolic bone disease - Ca 8.8 - / calcitriol 1.25 On HD on 4 phoslo ac tid 7. Nutrition - needs renal diet/vit 8. Diabetes - per primary 9. Atrial tachy cardia - on BB on non HD days and amiodarone - followed by Dr. Felecia Shelling, PA-C [facility] [phone] 10/27/2014,2:17 PM  LOS: 4 days   Pt seen, examined and agree w A/P as above. Getting dry weight down.  Has sig 3V CAD and cardiology is discussing what best option will be.  HD Wed.  Kelly Splinter MD [facility] pager [phone]    cell [phone] 10/27/2014,  5:02 PM    Labs: Basic Metabolic Panel:  Recent Labs Lab 10/25/14 0545 10/26/14 0521 10/27/14 0351  NA 134* 134* 137  K 5.1 3.7 4.2  CL 94* 94* 99*  CO2 26 25 27   GLUCOSE 134* 163* 168*  BUN 34* 53* 28*  CREATININE 5.89* 7.64* 5.55*  CALCIUM 9.0 9.1 8.8*   CBC:  Recent Labs Lab 10/23/14 1327 10/24/14 0519 10/25/14 0545 10/26/14 0521 10/27/14 0351  WBC 11.2* 7.7 8.7 8.8 7.2  NEUTROABS 9.1*  --   --   --   --   HGB 10.7* 10.6* 9.6* 10.4* 11.5*  HCT 34.1* 34.4* 30.2* 33.2* 36.7  MCV 90.5 90.8 89.3 89.0 90.8  PLT 165 226 255 271 269   Cardiac Enzymes:  Recent Labs Lab 10/24/14 0116 10/24/14 0519 10/24/14 1200  TROPONINI 1.90* 3.07* 2.92*   CBG:  Recent Labs Lab 10/24/14 1633 10/26/14 1717 10/27/14 0809 10/27/14 1209 10/27/14 1331  GLUCAP 163* 148* 146* 138* 192*    Studies/Results: No results found. Medications: . sodium chloride    . heparin     . amiodarone  100 mg Oral Daily  .  antiseptic oral rinse  7 mL Mouth Rinse BID  . aspirin  325 mg Oral Daily  . atorvastatin  40 mg Oral q1800  . calcitRIOL  1.25 mcg Oral Q M,W,F-HD  . calcium acetate  2,001 mg Oral TID WC  . dorzolamide-timolol  1 drop Left Eye TID AC & HS  . metoprolol  50 mg Oral Once per day on Sun Tue Thu Sat  . mometasone-formoterol  2 puff Inhalation BID  . prednisoLONE acetate  1 drop Left Eye TID AC & HS  . sodium chloride  3 mL Intravenous Q12H  . sodium chloride  3 mL Intravenous Q12H  . sodium chloride  3 mL Intravenous Q12H

## 2014-10-27 NOTE — Progress Notes (Signed)
Patient Name: [name] Date of Encounter: 10/27/2014     Principal Problem:   Acute respiratory failure with hypoxia ([place]) Active Problems:   LBBB (left bundle branch block)   ESRD on dialysis ([place])   Shortness of breath   IDDM (insulin dependent diabetes mellitus) (HCC)   Elevated troponin   Congestive heart failure (CHF) ([place])   Acute hypoxemic respiratory failure ([place])    SUBJECTIVE  The patient feels well today.    No CP , no dyspnea.  Has had deteriation of her LV function .  Have scheduled cath Discussed risks, benefits, options of cardiac cath .  Pre-meds ordered for contrast allergy   CURRENT MEDS . amiodarone  100 mg Oral Daily  . antiseptic oral rinse  7 mL Mouth Rinse BID  . aspirin  325 mg Oral Daily  . atorvastatin  40 mg Oral q1800  . calcitRIOL  1.25 mcg Oral Q M,W,F-HD  . calcium acetate  2,001 mg Oral TID WC  . diphenhydrAMINE  25 mg Intravenous Once  . dorzolamide-timolol  1 drop Left Eye TID AC & HS  . enoxaparin (LOVENOX) injection  30 mg Subcutaneous Daily  . famotidine  20 mg Intravenous Once  . methylPREDNISolone sodium succinate  125 mg Intravenous Once  . metoprolol  50 mg Oral Once per day on Sun Tue Thu Sat  . mometasone-formoterol  2 puff Inhalation BID  . prednisoLONE acetate  1 drop Left Eye TID AC & HS  . sodium chloride  3 mL Intravenous Q12H  . sodium chloride  3 mL Intravenous Q12H    OBJECTIVE  Filed Vitals:   10/26/14 1817 10/26/14 1919 10/26/14 2137 10/27/14 0853  BP: 115/49  124/54   Pulse: 87  84 82  Temp: 98.5 F (36.9 C)  97.9 F (36.6 C)   TempSrc: Oral  Oral   Resp: 20  20 20   Height:   5' (1.524 m)   Weight:   87.544 kg (193 lb)   SpO2: 93% 94% 95% 95%    Intake/Output Summary (Last 24 hours) at 10/27/14 0907 Last data filed at 10/26/14 1253  Gross per 24 hour  Intake      0 ml  Output   3000 ml  Net  -3000 ml   Filed Weights   10/25/14 2035 10/26/14 0835 10/26/14 2137  Weight: 92.08 kg (203  lb) 90.4 kg (199 lb 4.7 oz) 87.544 kg (193 lb)    PHYSICAL EXAM  General: Pleasant, NAD. Neuro: Alert and oriented X 3. Moves all extremities spontaneously. Psych: Normal affect. HEENT:  Normal  Neck: Supple without bruits or JVD. Lungs:  Resp regular and unlabored, CTA. Heart: RRR no s3, s4.  There is a grade 4-3/1 systolic ejection murmur at the aortic area. Abdomen: Soft, non-tender, non-distended.  Colostomy and urostomy sites are unremarkable.  Extremities: No peripheral edema  Accessory Clinical Findings  CBC  Recent Labs  10/26/14 0521 10/27/14 0351  WBC 8.8 7.2  HGB 10.4* 11.5*  HCT 33.2* 36.7  MCV 89.0 90.8  PLT 271 540   Basic Metabolic Panel  Recent Labs  10/26/14 0521 10/27/14 0351  NA 134* 137  K 3.7 4.2  CL 94* 99*  CO2 25 27  GLUCOSE 163* 168*  BUN 53* 28*  CREATININE 7.64* 5.55*  CALCIUM 9.1 8.8*   Liver Function Tests No results for input(s): AST, ALT, ALKPHOS, BILITOT, PROT, ALBUMIN in the last 72 hours. No results for input(s): LIPASE, AMYLASE in  the last 72 hours. Cardiac Enzymes  Recent Labs  10/24/14 1200  TROPONINI 2.92*   BNP Invalid input(s): POCBNP D-Dimer No results for input(s): DDIMER in the last 72 hours. Hemoglobin A1C No results for input(s): HGBA1C in the last 72 hours. Fasting Lipid Panel  Recent Labs  10/25/14 0545  CHOL 166  HDL 27*  LDLCALC 98  TRIG 205*  CHOLHDL 6.1   Thyroid Function Tests No results for input(s): TSH, T4TOTAL, T3FREE, THYROIDAB in the last 72 hours.  Invalid input(s): FREET3  TELE  Normal sinus rhythm  ECG    Radiology/Studies  Dg Chest 2 View  10/23/2014  CLINICAL DATA:  Shortness of Breath EXAM: CHEST  2 VIEW COMPARISON:  September 22, 2013 FINDINGS: There is mild generalized interstitial edema. There is cardiomegaly with pulmonary venous hypertension. No airspace consolidation. There is a minimal right pleural effusion. No adenopathy. No bone lesions. There is  atherosclerotic change in the aorta. IMPRESSION: Findings consistent with a degree of congestive heart failure. No airspace consolidation. Electronically Signed   By: Lowella Grip III M.D.   On: 10/23/2014 12:55    ASSESSMENT AND PLAN 1. Elevated Troponin Has known moderate CAD. Her presentation was c/w demand ischemia.  She is much better after dialysis . Her EF has dropped to 35%. Cath this am with  Dr. Ellyn Hack .  I've discussed risks, benefits, options,.  She understands and agrees to proceed.   2. ESRD:  Per nephrololgy .  Discussed the cath with Dr.  Jonnie Finner.    Jode Lippe, Wonda Cheng, MD  10/27/2014 9:07 AM    [facility] [place],  [place], [place]  [address] Pager [phone] Phone: [phone]; Fax: [phone]   [facility]  [address] [place], [place]  [address] [phone]   Fax [phone]

## 2014-10-27 NOTE — H&P (View-Only) (Signed)
Patient Name: [name] Date of Encounter: 10/27/2014     Principal Problem:   Acute respiratory failure with hypoxia ([place]) Active Problems:   LBBB (left bundle branch block)   ESRD on dialysis ([place])   Shortness of breath   IDDM (insulin dependent diabetes mellitus) (HCC)   Elevated troponin   Congestive heart failure (CHF) ([place])   Acute hypoxemic respiratory failure ([place])    SUBJECTIVE  The patient feels well today.    No CP , no dyspnea.  Has had deteriation of her LV function .  Have scheduled cath Discussed risks, benefits, options of cardiac cath .  Pre-meds ordered for contrast allergy   CURRENT MEDS . amiodarone  100 mg Oral Daily  . antiseptic oral rinse  7 mL Mouth Rinse BID  . aspirin  325 mg Oral Daily  . atorvastatin  40 mg Oral q1800  . calcitRIOL  1.25 mcg Oral Q M,W,F-HD  . calcium acetate  2,001 mg Oral TID WC  . diphenhydrAMINE  25 mg Intravenous Once  . dorzolamide-timolol  1 drop Left Eye TID AC & HS  . enoxaparin (LOVENOX) injection  30 mg Subcutaneous Daily  . famotidine  20 mg Intravenous Once  . methylPREDNISolone sodium succinate  125 mg Intravenous Once  . metoprolol  50 mg Oral Once per day on Sun Tue Thu Sat  . mometasone-formoterol  2 puff Inhalation BID  . prednisoLONE acetate  1 drop Left Eye TID AC & HS  . sodium chloride  3 mL Intravenous Q12H  . sodium chloride  3 mL Intravenous Q12H    OBJECTIVE  Filed Vitals:   10/26/14 1817 10/26/14 1919 10/26/14 2137 10/27/14 0853  BP: 115/49  124/54   Pulse: 87  84 82  Temp: 98.5 F (36.9 C)  97.9 F (36.6 C)   TempSrc: Oral  Oral   Resp: 20  20 20   Height:   5' (1.524 m)   Weight:   87.544 kg (193 lb)   SpO2: 93% 94% 95% 95%    Intake/Output Summary (Last 24 hours) at 10/27/14 0907 Last data filed at 10/26/14 1253  Gross per 24 hour  Intake      0 ml  Output   3000 ml  Net  -3000 ml   Filed Weights   10/25/14 2035 10/26/14 0835 10/26/14 2137  Weight: 92.08 kg (203  lb) 90.4 kg (199 lb 4.7 oz) 87.544 kg (193 lb)    PHYSICAL EXAM  General: Pleasant, NAD. Neuro: Alert and oriented X 3. Moves all extremities spontaneously. Psych: Normal affect. HEENT:  Normal  Neck: Supple without bruits or JVD. Lungs:  Resp regular and unlabored, CTA. Heart: RRR no s3, s4.  There is a grade 9-5/6 systolic ejection murmur at the aortic area. Abdomen: Soft, non-tender, non-distended.  Colostomy and urostomy sites are unremarkable.  Extremities: No peripheral edema  Accessory Clinical Findings  CBC  Recent Labs  10/26/14 0521 10/27/14 0351  WBC 8.8 7.2  HGB 10.4* 11.5*  HCT 33.2* 36.7  MCV 89.0 90.8  PLT 271 387   Basic Metabolic Panel  Recent Labs  10/26/14 0521 10/27/14 0351  NA 134* 137  K 3.7 4.2  CL 94* 99*  CO2 25 27  GLUCOSE 163* 168*  BUN 53* 28*  CREATININE 7.64* 5.55*  CALCIUM 9.1 8.8*   Liver Function Tests No results for input(s): AST, ALT, ALKPHOS, BILITOT, PROT, ALBUMIN in the last 72 hours. No results for input(s): LIPASE, AMYLASE in  the last 72 hours. Cardiac Enzymes  Recent Labs  10/24/14 1200  TROPONINI 2.92*   BNP Invalid input(s): POCBNP D-Dimer No results for input(s): DDIMER in the last 72 hours. Hemoglobin A1C No results for input(s): HGBA1C in the last 72 hours. Fasting Lipid Panel  Recent Labs  10/25/14 0545  CHOL 166  HDL 27*  LDLCALC 98  TRIG 205*  CHOLHDL 6.1   Thyroid Function Tests No results for input(s): TSH, T4TOTAL, T3FREE, THYROIDAB in the last 72 hours.  Invalid input(s): FREET3  TELE  Normal sinus rhythm  ECG    Radiology/Studies  Dg Chest 2 View  10/23/2014  CLINICAL DATA:  Shortness of Breath EXAM: CHEST  2 VIEW COMPARISON:  September 22, 2013 FINDINGS: There is mild generalized interstitial edema. There is cardiomegaly with pulmonary venous hypertension. No airspace consolidation. There is a minimal right pleural effusion. No adenopathy. No bone lesions. There is  atherosclerotic change in the aorta. IMPRESSION: Findings consistent with a degree of congestive heart failure. No airspace consolidation. Electronically Signed   By: Lowella Grip III M.D.   On: 10/23/2014 12:55    ASSESSMENT AND PLAN 1. Elevated Troponin Has known moderate CAD. Her presentation was c/w demand ischemia.  She is much better after dialysis . Her EF has dropped to 35%. Cath this am with  Dr. Ellyn Hack .  I've discussed risks, benefits, options,.  She understands and agrees to proceed.   2. ESRD:  Per nephrololgy .  Discussed the cath with Dr.  Jonnie Finner.    Camdynn Maranto, Wonda Cheng, MD  10/27/2014 9:07 AM    [facility] [place],  [place], [place]  [address] Pager [phone] Phone: [phone]; Fax: [phone]   [facility]  [address] [place], [place]  [address] [phone]   Fax [phone]

## 2014-10-27 NOTE — Progress Notes (Signed)
Pt alert and oriented x 4.  Consent for heart cath signed and in chart.  Pt is allergic to contrast dye.

## 2014-10-27 NOTE — Interval H&P Note (Signed)
History and Physical Interval Note:  10/27/2014 11:02 AM  [name]  has presented today for surgery, with the diagnosis of NSTEMI.   The various methods of treatment have been discussed with the patient and family. After consideration of risks, benefits and other options for treatment, the patient has consented to  Procedure(s): Left Heart Cath and Coronary Angiography (N/A) as a surgical intervention .  The patient's history has been reviewed, patient examined, no change in status, stable for surgery.  I have reviewed the patient's chart and labs.  Questions were answered to the patient's satisfaction.     [place], [place]  Cath Lab Visit (complete for each Cath Lab visit)  Clinical Evaluation Leading to the Procedure:   ACS: Yes.    Non-ACS:    Anginal Classification: CCS IV- presented with acute respiratory failure  Anti-ischemic medical therapy: Minimal Therapy (1 class of medications)  Non-Invasive Test Results: No non-invasive testing performed  Prior CABG: No previous CABG    TIMI Score  Patient Information:  TIMI Score is 5  Revascularization of the presumed culprit artery  A (9)  Indication: 11; Score: 9 TIMI Score  Patient Information:  TIMI Score is 5  Revascularization of multiple coronary arteries when the culprit artery cannot clearly be determined  A (9)  Indication: 12; Score: 9

## 2014-10-27 NOTE — Progress Notes (Signed)
Site area: rt groin fa sheath Site Prior to Removal:  Level  0 Pressure Applied For:  20 minutes Manual:   yes Patient Status During Pull:  stable Post Pull Site:  Level  0 Post Pull Instructions Given:  yes Post Pull Pulses Present: yes Dressing Applied:  tegaderm Bedrest begins @  1443 Comments:  0

## 2014-10-28 ENCOUNTER — Encounter (HOSPITAL_COMMUNITY)
Admission: EM | Disposition: A | Discharge status: Home / Self Care | Payer: Self-pay | Source: Home / Self Care | Attending: Internal Medicine

## 2014-10-28 DIAGNOSIS — I5021 Acute systolic (congestive) heart failure: Secondary | ICD-10-CM

## 2014-10-28 DIAGNOSIS — N186 End stage renal disease: Secondary | ICD-10-CM

## 2014-10-28 DIAGNOSIS — I255 Ischemic cardiomyopathy: Secondary | ICD-10-CM

## 2014-10-28 DIAGNOSIS — Z992 Dependence on renal dialysis: Secondary | ICD-10-CM

## 2014-10-28 HISTORY — PX: CARDIAC CATHETERIZATION: SHX172

## 2014-10-28 LAB — BASIC METABOLIC PANEL
ANION GAP: 16 — AB (ref 5–15)
BUN: 54 mg/dL — AB (ref 6–20)
CHLORIDE: 93 mmol/L — AB (ref 101–111)
CO2: 26 mmol/L (ref 22–32)
CREATININE: 7.66 mg/dL — AB (ref 0.44–1.00)
Calcium: 9.1 mg/dL (ref 8.9–10.3)
GFR calc Af Amer: 6 mL/min — ABNORMAL LOW (ref >60)
GFR calc non Af Amer: 5 mL/min — ABNORMAL LOW (ref >60)
GLUCOSE: 341 mg/dL — AB (ref 65–99)
POTASSIUM: 5.2 mmol/L — AB (ref 3.5–5.1)
Sodium: 135 mmol/L (ref 135–145)

## 2014-10-28 LAB — GLUCOSE, CAPILLARY
GLUCOSE-CAPILLARY: 150 mg/dL — AB (ref 65–99)
GLUCOSE-CAPILLARY: 221 mg/dL — AB (ref 65–99)
Glucose-Capillary: 271 mg/dL — ABNORMAL HIGH (ref 65–99)

## 2014-10-28 LAB — HEPARIN LEVEL (UNFRACTIONATED): Heparin Unfractionated: <0.1 IU/mL — ABNORMAL LOW (ref 0.30–0.70)

## 2014-10-28 LAB — CBC
HEMATOCRIT: 37.4 % (ref 36.0–46.0)
Hemoglobin: 11.8 g/dL — ABNORMAL LOW (ref 12.0–15.0)
MCH: 28.2 pg (ref 26.0–34.0)
MCHC: 31.6 g/dL (ref 30.0–36.0)
MCV: 89.3 fL (ref 78.0–100.0)
Platelets: 276 10*3/uL (ref 150–400)
RBC: 4.19 MIL/uL (ref 3.87–5.11)
RDW: 16.1 % — AB (ref 11.5–15.5)
WBC: 8.9 10*3/uL (ref 4.0–10.5)

## 2014-10-28 LAB — POCT ACTIVATED CLOTTING TIME
ACTIVATED CLOTTING TIME: 208 s
ACTIVATED CLOTTING TIME: 128 s
ACTIVATED CLOTTING TIME: 417 s
ACTIVATED CLOTTING TIME: 208 s
Activated Clotting Time: 165 seconds
Activated Clotting Time: 233 seconds
Activated Clotting Time: 270 seconds
Activated Clotting Time: 220 seconds
Activated Clotting Time: 245 seconds
Activated Clotting Time: 189 seconds
Activated Clotting Time: 196 seconds

## 2014-10-28 SURGERY — CORONARY STENT INTERVENTION
Anesthesia: LOCAL

## 2014-10-28 MED ORDER — METHYLPREDNISOLONE SODIUM SUCC 125 MG IJ SOLR: 125.0000 mg | INTRAMUSCULAR | Status: DC

## 2014-10-28 MED ORDER — SODIUM CHLORIDE 0.9 % IJ SOLN: 3.0000 mL | INTRAMUSCULAR | Status: DC | PRN

## 2014-10-28 MED ORDER — BIVALIRUDIN 250 MG IV SOLR
250.0000 mg | INTRAVENOUS | Status: DC | PRN
  Administered 2014-10-28: 0.25 mg/kg/h via INTRAVENOUS

## 2014-10-28 MED ORDER — SODIUM CHLORIDE 0.9 % IJ SOLN: 3.0000 mL | Freq: Two times a day (BID) | INTRAMUSCULAR | Status: DC

## 2014-10-28 MED ORDER — HEPARIN SODIUM (PORCINE) 1000 UNIT/ML IJ SOLN
INTRAMUSCULAR | Status: DC | PRN
  Administered 2014-10-28: 2000 [IU] via INTRAVENOUS
  Administered 2014-10-28: 6000 [IU] via INTRAVENOUS
  Administered 2014-10-28 (×2): 4000 [IU] via INTRAVENOUS

## 2014-10-28 MED ORDER — FENTANYL CITRATE (PF) 100 MCG/2ML IJ SOLN
INTRAMUSCULAR | Status: AC
  Filled 2014-10-28: qty 4

## 2014-10-28 MED ORDER — ANGIOPLASTY BOOK
Freq: Once | Status: AC
  Administered 2014-10-29
  Filled 2014-10-28: qty 1

## 2014-10-28 MED ORDER — MIDAZOLAM HCL 2 MG/2ML IJ SOLN
INTRAMUSCULAR | Status: AC
Start: 2014-10-28 — End: 2014-10-28
  Filled 2014-10-28: qty 4

## 2014-10-28 MED ORDER — FAMOTIDINE IN NACL 20-0.9 MG/50ML-% IV SOLN
20.0000 mg | INTRAVENOUS | Status: DC
Start: 2014-10-28 — End: 2014-10-28
  Filled 2014-10-28: qty 50

## 2014-10-28 MED ORDER — ATROPINE SULFATE 0.1 MG/ML IJ SOLN
INTRAMUSCULAR | Status: AC
Start: 2014-10-28 — End: 2014-10-29
  Filled 2014-10-28: qty 10

## 2014-10-28 MED ORDER — ASPIRIN 81 MG PO CHEW
81.0000 mg | CHEWABLE_TABLET | ORAL | Status: AC
  Administered 2014-10-28: 81 mg via ORAL
  Filled 2014-10-28: qty 1

## 2014-10-28 MED ORDER — SODIUM CHLORIDE 0.9 % IV SOLN: 250.0000 mL | INTRAVENOUS | Status: DC | PRN

## 2014-10-28 MED ORDER — DIPHENHYDRAMINE HCL 50 MG/ML IJ SOLN
INTRAMUSCULAR | Status: DC | PRN
  Administered 2014-10-28: 25 mg via INTRAVENOUS

## 2014-10-28 MED ORDER — FAMOTIDINE IN NACL 20-0.9 MG/50ML-% IV SOLN
INTRAVENOUS | Status: DC | PRN
  Administered 2014-10-28: 20 mg via INTRAVENOUS

## 2014-10-28 MED ORDER — TICAGRELOR 90 MG PO TABS
ORAL_TABLET | ORAL | Status: DC | PRN
  Administered 2014-10-28: 180 mg via ORAL

## 2014-10-28 MED ORDER — KIDNEY FAILURE BOOK
Freq: Once | Status: DC
  Filled 2014-10-28: qty 1

## 2014-10-28 MED ORDER — NITROGLYCERIN 1 MG/10 ML FOR IR/CATH LAB
INTRA_ARTERIAL | Status: DC | PRN
  Administered 2014-10-28: 200 ug via INTRACORONARY

## 2014-10-28 MED ORDER — SODIUM CHLORIDE 0.9 % WEIGHT BASED INFUSION: 3.0000 mL/kg/h | INTRAVENOUS | Status: DC

## 2014-10-28 MED ORDER — FAMOTIDINE IN NACL 20-0.9 MG/50ML-% IV SOLN
INTRAVENOUS | Status: AC
  Filled 2014-10-28: qty 50

## 2014-10-28 MED ORDER — SODIUM CHLORIDE 0.9 % WEIGHT BASED INFUSION: 1.0000 mL/kg/h | INTRAVENOUS | Status: DC

## 2014-10-28 MED ORDER — TICAGRELOR 90 MG PO TABS
ORAL_TABLET | ORAL | Status: AC
  Filled 2014-10-28: qty 2

## 2014-10-28 MED ORDER — TICAGRELOR 90 MG PO TABS
90.0000 mg | ORAL_TABLET | Freq: Two times a day (BID) | ORAL | Status: DC
  Administered 2014-10-29 (×2): 90 mg via ORAL
  Filled 2014-10-28 (×2): qty 1

## 2014-10-28 MED ORDER — HEPARIN SODIUM (PORCINE) 1000 UNIT/ML IJ SOLN
INTRAMUSCULAR | Status: AC
  Filled 2014-10-28: qty 1

## 2014-10-28 MED ORDER — LIDOCAINE HCL (PF) 1 % IJ SOLN
INTRAMUSCULAR | Status: AC
  Filled 2014-10-28: qty 30

## 2014-10-28 MED ORDER — NITROGLYCERIN 1 MG/10 ML FOR IR/CATH LAB
INTRA_ARTERIAL | Status: AC
  Filled 2014-10-28: qty 10

## 2014-10-28 MED ORDER — DIPHENHYDRAMINE HCL 50 MG/ML IJ SOLN: 25.0000 mg | INTRAMUSCULAR | Status: DC

## 2014-10-28 MED ORDER — ASPIRIN 81 MG PO CHEW
81.0000 mg | CHEWABLE_TABLET | Freq: Every day | ORAL | Status: DC
  Administered 2014-10-29: 10:00:00 81 mg via ORAL
  Filled 2014-10-28: qty 1

## 2014-10-28 MED ORDER — METHYLPREDNISOLONE SODIUM SUCC 125 MG IJ SOLR
INTRAMUSCULAR | Status: AC
  Filled 2014-10-28: qty 2

## 2014-10-28 MED ORDER — BIVALIRUDIN BOLUS VIA INFUSION - CUPID
INTRAVENOUS | Status: DC | PRN
  Administered 2014-10-28: 66.15 mg via INTRAVENOUS

## 2014-10-28 MED ORDER — IOHEXOL 350 MG/ML SOLN
INTRAVENOUS | Status: DC | PRN
  Administered 2014-10-28: 225 mL via INTRAVENOUS

## 2014-10-28 MED ORDER — TICAGRELOR 90 MG PO TABS
180.0000 mg | ORAL_TABLET | Freq: Once | ORAL | Status: DC
  Filled 2014-10-28: qty 2

## 2014-10-28 MED ORDER — MIDAZOLAM HCL 2 MG/2ML IJ SOLN
INTRAMUSCULAR | Status: DC | PRN
  Administered 2014-10-28: 1 mg via INTRAVENOUS

## 2014-10-28 MED ORDER — ASPIRIN 325 MG PO TABS: 325.0000 mg | ORAL_TABLET | Freq: Every day | ORAL | Status: DC

## 2014-10-28 MED ORDER — HEPARIN (PORCINE) IN NACL 2-0.9 UNIT/ML-% IJ SOLN
INTRAMUSCULAR | Status: AC
  Filled 2014-10-28: qty 1500

## 2014-10-28 MED ORDER — METHYLPREDNISOLONE SODIUM SUCC 125 MG IJ SOLR
INTRAMUSCULAR | Status: DC | PRN
  Administered 2014-10-28: 125 mg via INTRAVENOUS

## 2014-10-28 MED ORDER — SODIUM CHLORIDE 0.9 % IV SOLN: INTRAVENOUS | Status: DC

## 2014-10-28 MED ORDER — BIVALIRUDIN 250 MG IV SOLR
INTRAVENOUS | Status: AC
  Filled 2014-10-28: qty 250

## 2014-10-28 MED ORDER — FENTANYL CITRATE (PF) 100 MCG/2ML IJ SOLN
INTRAMUSCULAR | Status: DC | PRN
  Administered 2014-10-28: 25 ug via INTRAVENOUS

## 2014-10-28 MED ORDER — DIPHENHYDRAMINE HCL 50 MG/ML IJ SOLN
INTRAMUSCULAR | Status: AC
  Filled 2014-10-28: qty 1

## 2014-10-28 MED ORDER — NITROGLYCERIN 1 MG/10 ML FOR IR/CATH LAB
INTRA_ARTERIAL | Status: DC | PRN
  Administered 2014-10-28: 14:00:00

## 2014-10-28 SURGICAL SUPPLY — 22 items
BALLN EMERGE MR 2.0X12 (BALLOONS) ×4
BALLN MINITREK RX 1.5X12 (BALLOONS) ×4
BALLN ~~LOC~~ EMERGE MR 2.25X12 (BALLOONS) ×2
BALLOON EMERGE MR 2.0X12 (BALLOONS) ×2 IMPLANT
BALLOON MINITREK RX 1.5X12 (BALLOONS) ×2 IMPLANT
BALLOON ~~LOC~~ EMERGE MR 2.25X12 (BALLOONS) ×1 IMPLANT
GUIDE CATH RUNWAY 6FR AL 75 (CATHETERS) ×2 IMPLANT
GUIDE CATH RUNWAY 6FR CLS3 (CATHETERS) ×2 IMPLANT
GUIDE CATH RUNWAY 6FR FR4 (CATHETERS) ×2 IMPLANT
GUIDELINER 6F (CATHETERS) ×2 IMPLANT
KIT ENCORE 26 ADVANTAGE (KITS) ×2 IMPLANT
KIT HEART LEFT (KITS) ×2 IMPLANT
PACK CARDIAC CATHETERIZATION (CUSTOM PROCEDURE TRAY) ×2 IMPLANT
SHEATH PINNACLE 4F 10CM (SHEATH) ×2 IMPLANT
SHEATH PINNACLE 6F 10CM (SHEATH) ×2 IMPLANT
STENT PROMUS PREM MR 2.25X12 (Permanent Stent) ×2 IMPLANT
TRANSDUCER W/STOPCOCK (MISCELLANEOUS) ×2 IMPLANT
TUBING CIL FLEX 10 FLL-RA (TUBING) ×2 IMPLANT
WIRE ASAHI FIELDER XT 190CM (WIRE) ×2 IMPLANT
WIRE ASAHI PROWATER 180CM (WIRE) ×2 IMPLANT
WIRE EMERALD 3MM-J .035X150CM (WIRE) ×2 IMPLANT
WIRE PT2 MS 185 (WIRE) ×2 IMPLANT

## 2014-10-28 NOTE — Progress Notes (Signed)
Patient Name: [name] Date of Encounter: 10/28/2014     Principal Problem:   Acute respiratory failure with hypoxia ([place]) Active Problems:   LBBB (left bundle branch block)   ESRD on dialysis ([place])   Shortness of breath   IDDM (insulin dependent diabetes mellitus) (HCC)   Elevated troponin   Congestive heart failure (CHF) (HCC)   Acute hypoxemic respiratory failure (HCC)   Non-ST elevated myocardial infarction (non-STEMI) ([place])    SUBJECTIVE  The patient feels well today.    No CP , no dyspnea.  Has had deteriation of her LV function .  Cath shows several lesions. 1. Prox RCA to Mid RCA lesion, 95% stenosed -- new. Post Atrio lesion, 80% stenosed. Ost RPDA lesion, 100% stenosed - previously described. 2. Mid Cx lesion, 75% stenosed. Ost 2nd Mrg lesion, 80% stenosed. 2nd Mrg lesion, 60% stenosed. 3. Mid Cx lesion, 75% stenosed. 4. Prox LAD to Mid LAD lesion, 30% stenosed. Dist LAD lesion, 90% stenosed. 5. 1st Diag lesion, 95% stenosed. 6. Ost 2nd Diag to 2nd Diag lesion, 50% stenosed. 2nd Diag lesion, 60% stenosed. Lat 2nd Diag lesion, 80% stenosed. 7. There is moderate left ventricular systolic dysfunction.  Scheduled for PCI of her D1 / Ramus intermediate branch today  Examined in HD   CURRENT MEDS . amiodarone  100 mg Oral Daily  . antiseptic oral rinse  7 mL Mouth Rinse BID  . [START ON 10/29/2014] aspirin  325 mg Oral Daily  . atorvastatin  40 mg Oral q1800  . calcitRIOL  1.25 mcg Oral Q M,W,F-HD  . calcium acetate  2,001 mg Oral TID WC  . diphenhydrAMINE  25 mg Intravenous Pre-Cath  . dorzolamide-timolol  1 drop Left Eye TID AC & HS  . famotidine (PEPCID) IV  20 mg Intravenous Pre-Cath  . insulin aspart  0-15 Units Subcutaneous TID WC  . kidney failure book   Does not apply Once  . methylPREDNISolone (SOLU-MEDROL) injection  125 mg Intravenous Pre-Cath  . metoprolol  50 mg Oral Once per day on Sun Tue Thu Sat  . mometasone-formoterol  2 puff  Inhalation BID  . prednisoLONE acetate  1 drop Left Eye TID AC & HS  . sodium chloride  3 mL Intravenous Q12H  . sodium chloride  3 mL Intravenous Q12H  . sodium chloride  3 mL Intravenous Q12H  . sodium chloride  3 mL Intravenous Q12H  . ticagrelor  180 mg Oral Once    OBJECTIVE  Filed Vitals:   10/28/14 0928 10/28/14 0946 10/28/14 1019 10/28/14 1045  BP: 133/48 125/58 127/43 133/59  Pulse: 90 89 87 89  Temp:      TempSrc:      Resp:      Height:      Weight:      SpO2:        Intake/Output Summary (Last 24 hours) at 10/28/14 1058 Last data filed at 10/28/14 0600  Gross per 24 hour  Intake 322.93 ml  Output      0 ml  Net 322.93 ml   Filed Weights   10/26/14 0835 10/26/14 2137 10/28/14 0654  Weight: 90.4 kg (199 lb 4.7 oz) 87.544 kg (193 lb) 90 kg (198 lb 6.6 oz)    PHYSICAL EXAM  General: Pleasant, NAD. Neuro: Alert and oriented X 3. Moves all extremities spontaneously. Psych: Normal affect. HEENT:  Normal  Neck: Supple without bruits or JVD. Lungs:  Resp regular and unlabored, CTA. Heart: RRR no  s3, s4.  There is a grade 8-9/3 systolic ejection murmur at the aortic area. Abdomen: Soft, non-tender, non-distended.  Colostomy and urostomy sites are unremarkable.  Extremities: No peripheral edema  Accessory Clinical Findings  CBC  Recent Labs  10/27/14 0351 10/28/14 0349  WBC 7.2 8.9  HGB 11.5* 11.8*  HCT 36.7 37.4  MCV 90.8 89.3  PLT 269 810   Basic Metabolic Panel  Recent Labs  10/27/14 0351 10/28/14 0349  NA 137 135  K 4.2 5.2*  CL 99* 93*  CO2 27 26  GLUCOSE 168* 341*  BUN 28* 54*  CREATININE 5.55* 7.66*  CALCIUM 8.8* 9.1   Liver Function Tests No results for input(s): AST, ALT, ALKPHOS, BILITOT, PROT, ALBUMIN in the last 72 hours. No results for input(s): LIPASE, AMYLASE in the last 72 hours. Cardiac Enzymes No results for input(s): CKTOTAL, CKMB, CKMBINDEX, TROPONINI in the last 72 hours. BNP Invalid input(s): POCBNP D-Dimer No  results for input(s): DDIMER in the last 72 hours. Hemoglobin A1C No results for input(s): HGBA1C in the last 72 hours. Fasting Lipid Panel No results for input(s): CHOL, HDL, LDLCALC, TRIG, CHOLHDL, LDLDIRECT in the last 72 hours. Thyroid Function Tests No results for input(s): TSH, T4TOTAL, T3FREE, THYROIDAB in the last 72 hours.  Invalid input(s): FREET3  TELE  Normal sinus rhythm  ECG    Radiology/Studies  Dg Chest 2 View  10/23/2014  CLINICAL DATA:  Shortness of Breath EXAM: CHEST  2 VIEW COMPARISON:  September 22, 2013 FINDINGS: There is mild generalized interstitial edema. There is cardiomegaly with pulmonary venous hypertension. No airspace consolidation. There is a minimal right pleural effusion. No adenopathy. No bone lesions. There is atherosclerotic change in the aorta. IMPRESSION: Findings consistent with a degree of congestive heart failure. No airspace consolidation. Electronically Signed   By: Lowella Grip III M.D.   On: 10/23/2014 12:55    ASSESSMENT AND PLAN 1. CAD:  As multiple lesions as described above. Scheduled for PCI of D1 today     2. ESRD:  Per nephrololgy .  Discussed the cath with Dr.  Jonnie Finner. Continue HD    Jyair Kiraly, Wonda Cheng, MD  10/28/2014 10:58 AM    [facility] [place],  [place], [place]  [address] Pager [phone] Phone: [phone]; Fax: [phone]   [facility]  [address] [place], [place]  [address] [phone]   Fax [phone]

## 2014-10-28 NOTE — Progress Notes (Signed)
Hemodialysis- Patient tolerated well without issue. Picked up for cardiac cath from hd department. Report called to rn on 6E. Patient has no complaints

## 2014-10-28 NOTE — H&P (View-Only) (Signed)
Patient Name: [name] Date of Encounter: 10/28/2014     Principal Problem:   Acute respiratory failure with hypoxia ([place]) Active Problems:   LBBB (left bundle branch block)   ESRD on dialysis ([place])   Shortness of breath   IDDM (insulin dependent diabetes mellitus) (HCC)   Elevated troponin   Congestive heart failure (CHF) (HCC)   Acute hypoxemic respiratory failure (HCC)   Non-ST elevated myocardial infarction (non-STEMI) ([place])    SUBJECTIVE  The patient feels well today.    No CP , no dyspnea.  Has had deteriation of her LV function .  Cath shows several lesions. 1. Prox RCA to Mid RCA lesion, 95% stenosed -- new. Post Atrio lesion, 80% stenosed. Ost RPDA lesion, 100% stenosed - previously described. 2. Mid Cx lesion, 75% stenosed. Ost 2nd Mrg lesion, 80% stenosed. 2nd Mrg lesion, 60% stenosed. 3. Mid Cx lesion, 75% stenosed. 4. Prox LAD to Mid LAD lesion, 30% stenosed. Dist LAD lesion, 90% stenosed. 5. 1st Diag lesion, 95% stenosed. 6. Ost 2nd Diag to 2nd Diag lesion, 50% stenosed. 2nd Diag lesion, 60% stenosed. Lat 2nd Diag lesion, 80% stenosed. 7. There is moderate left ventricular systolic dysfunction.  Scheduled for PCI of her D1 / Ramus intermediate branch today  Examined in HD   CURRENT MEDS . amiodarone  100 mg Oral Daily  . antiseptic oral rinse  7 mL Mouth Rinse BID  . [START ON 10/29/2014] aspirin  325 mg Oral Daily  . atorvastatin  40 mg Oral q1800  . calcitRIOL  1.25 mcg Oral Q M,W,F-HD  . calcium acetate  2,001 mg Oral TID WC  . diphenhydrAMINE  25 mg Intravenous Pre-Cath  . dorzolamide-timolol  1 drop Left Eye TID AC & HS  . famotidine (PEPCID) IV  20 mg Intravenous Pre-Cath  . insulin aspart  0-15 Units Subcutaneous TID WC  . kidney failure book   Does not apply Once  . methylPREDNISolone (SOLU-MEDROL) injection  125 mg Intravenous Pre-Cath  . metoprolol  50 mg Oral Once per day on Sun Tue Thu Sat  . mometasone-formoterol  2 puff  Inhalation BID  . prednisoLONE acetate  1 drop Left Eye TID AC & HS  . sodium chloride  3 mL Intravenous Q12H  . sodium chloride  3 mL Intravenous Q12H  . sodium chloride  3 mL Intravenous Q12H  . sodium chloride  3 mL Intravenous Q12H  . ticagrelor  180 mg Oral Once    OBJECTIVE  Filed Vitals:   10/28/14 0928 10/28/14 0946 10/28/14 1019 10/28/14 1045  BP: 133/48 125/58 127/43 133/59  Pulse: 90 89 87 89  Temp:      TempSrc:      Resp:      Height:      Weight:      SpO2:        Intake/Output Summary (Last 24 hours) at 10/28/14 1058 Last data filed at 10/28/14 0600  Gross per 24 hour  Intake 322.93 ml  Output      0 ml  Net 322.93 ml   Filed Weights   10/26/14 0835 10/26/14 2137 10/28/14 0654  Weight: 90.4 kg (199 lb 4.7 oz) 87.544 kg (193 lb) 90 kg (198 lb 6.6 oz)    PHYSICAL EXAM  General: Pleasant, NAD. Neuro: Alert and oriented X 3. Moves all extremities spontaneously. Psych: Normal affect. HEENT:  Normal  Neck: Supple without bruits or JVD. Lungs:  Resp regular and unlabored, CTA. Heart: RRR no  s3, s4.  There is a grade 6-3/8 systolic ejection murmur at the aortic area. Abdomen: Soft, non-tender, non-distended.  Colostomy and urostomy sites are unremarkable.  Extremities: No peripheral edema  Accessory Clinical Findings  CBC  Recent Labs  10/27/14 0351 10/28/14 0349  WBC 7.2 8.9  HGB 11.5* 11.8*  HCT 36.7 37.4  MCV 90.8 89.3  PLT 269 466   Basic Metabolic Panel  Recent Labs  10/27/14 0351 10/28/14 0349  NA 137 135  K 4.2 5.2*  CL 99* 93*  CO2 27 26  GLUCOSE 168* 341*  BUN 28* 54*  CREATININE 5.55* 7.66*  CALCIUM 8.8* 9.1   Liver Function Tests No results for input(s): AST, ALT, ALKPHOS, BILITOT, PROT, ALBUMIN in the last 72 hours. No results for input(s): LIPASE, AMYLASE in the last 72 hours. Cardiac Enzymes No results for input(s): CKTOTAL, CKMB, CKMBINDEX, TROPONINI in the last 72 hours. BNP Invalid input(s): POCBNP D-Dimer No  results for input(s): DDIMER in the last 72 hours. Hemoglobin A1C No results for input(s): HGBA1C in the last 72 hours. Fasting Lipid Panel No results for input(s): CHOL, HDL, LDLCALC, TRIG, CHOLHDL, LDLDIRECT in the last 72 hours. Thyroid Function Tests No results for input(s): TSH, T4TOTAL, T3FREE, THYROIDAB in the last 72 hours.  Invalid input(s): FREET3  TELE  Normal sinus rhythm  ECG    Radiology/Studies  Dg Chest 2 View  10/23/2014  CLINICAL DATA:  Shortness of Breath EXAM: CHEST  2 VIEW COMPARISON:  September 22, 2013 FINDINGS: There is mild generalized interstitial edema. There is cardiomegaly with pulmonary venous hypertension. No airspace consolidation. There is a minimal right pleural effusion. No adenopathy. No bone lesions. There is atherosclerotic change in the aorta. IMPRESSION: Findings consistent with a degree of congestive heart failure. No airspace consolidation. Electronically Signed   By: Lowella Grip III M.D.   On: 10/23/2014 12:55    ASSESSMENT AND PLAN 1. CAD:  As multiple lesions as described above. Scheduled for PCI of D1 today     2. ESRD:  Per nephrololgy .  Discussed the cath with Dr.  Jonnie Finner. Continue HD    Nahser, Wonda Cheng, MD  10/28/2014 10:58 AM    [place] [address],  [place], [place]  [address] Pager [phone] Phone: [phone]; Fax: [phone]   [facility]  [address] [place], [place]  [address] [phone]   Fax [phone]

## 2014-10-28 NOTE — Progress Notes (Signed)
[facility] PROGRESS NOTE  [name] ZJQ:[id number] DOB: [birth year] DOA: 10/23/2014 PCP: Walker Kehr, MD   Brief narrative 69 year old with ESRD on HD presented with shortness of breath with acute hypoxic resp failure. Her SOB improved after HD, but she was also found to have troponin up to 3.0, cardiology consulted .[name] Her 2-D echo showed drop off the LVEF from 55% to 35%. Cardiac cath done on 10/18 showed multivessel CAD mostly involving small vessels not suitable to PCI. She had repeat cardia cath done on 10/19 with stent placed over 95% stenosed 1st diagonal. Proximal RCA to mid RCA lesion was 95% stenosed but could not be stented due to inability to cross with a balloon.   Assessment/Plan:   Non-STEMI 2D echo with EF of 35% ( drop from 55%) with elevated troponin. Cardiac cath done with results outlined above.( DES to 1st diagonal) .  Needs dual antiplatelet ( ASA and brillinta) for at least 1 year. Stable post cath. Remains chest pain free. Monitor cath site( has some oozing from rt groin)  Acute hypoxic respiratory failure Secondary to ischemic  cardiomyopathy with NSTEMI. Symptoms rsolved with HD. Cardiac cath as above. continue scheduled HD.   Ischemic cardiomyopathy  secondary to NSTEMI. Currently euvolemic. Continue ASA, BB and lipitor  ESRD Dialysis days Monday, Wednesday and Friday, with evidence of pulmonary edema on x-ray. Low EF of 35% due to ischemic cardiomyopathy. Symptoms resolved with HD.   Diabetes mellitus type 2 A1C of 6.5. On prandin at home.  Monitor on SSI  Atrial tachycardia  stable on amiodarone    Code Status: Full Code  Family Communication: family at bedside  Disposition Plan: home on 10/20  Diet: Heart healthy  Consultants:  cardiology.  Nephrology  Procedures:  Hemodialysis  Antibiotics:  None  HPI/Subjective: Seen after cardiac cath. denies any pain over cath site , CP or SOB  Objective: Filed Vitals:    10/28/14 1539  BP: 94/77  Pulse: 93  Temp: 98.4 F (36.9 C)  Resp: 19    Intake/Output Summary (Last 24 hours) at 10/28/14 1738 Last data filed at 10/28/14 1112  Gross per 24 hour  Intake 202.93 ml  Output   1835 ml  Net -1632.07 ml   Filed Weights   10/26/14 2137 10/28/14 0654 10/28/14 1112  Weight: 87.544 kg (193 lb) 90 kg (198 lb 6.6 oz) 88.2 kg (194 lb 7.1 oz)    Exam:   General:  Elderly female in NAD  HEENT: no pallor, moist mucosa, supple neck   chest: clear b/l   CVS: N S1&S2, no muurmurs  Gl :soft, NT, ND, BS+   Musculoskeletal: warm, oozing over rt groin ( cath site)   Data Reviewed: Basic Metabolic Panel:  Recent Labs Lab 10/24/14 0519 10/25/14 0545 10/26/14 0521 10/27/14 0351 10/28/14 0349  NA 140 134* 134* 137 135  K 3.3* 5.1 3.7 4.2 5.2*  CL 98* 94* 94* 99* 93*  CO2 29 26 25 27 26   GLUCOSE 132* 134* 163* 168* 341*  BUN 14 34* 53* 28* 54*  CREATININE 4.10* 5.89* 7.64* 5.55* 7.66*  CALCIUM 8.8* 9.0 9.1 8.8* 9.1   Liver Function Tests: No results for input(s): AST, ALT, ALKPHOS, BILITOT, PROT, ALBUMIN in the last 168 hours. No results for input(s): LIPASE, AMYLASE in the last 168 hours. No results for input(s): AMMONIA in the last 168 hours. CBC:  Recent Labs Lab 10/23/14 1327 10/24/14 0519 10/25/14 0545 10/26/14 0521 10/27/14 0351 10/28/14 0349  WBC 11.2*  7.7 8.7 8.8 7.2 8.9  NEUTROABS 9.1*  --   --   --   --   --   HGB 10.7* 10.6* 9.6* 10.4* 11.5* 11.8*  HCT 34.1* 34.4* 30.2* 33.2* 36.7 37.4  MCV 90.5 90.8 89.3 89.0 90.8 89.3  PLT 165 226 255 271 269 276   Cardiac Enzymes:  Recent Labs Lab 10/24/14 0116 10/24/14 0519 10/24/14 1200  TROPONINI 1.90* 3.07* 2.92*   BNP (last 3 results)  Recent Labs  10/23/14 1327  BNP 1300.0*    ProBNP (last 3 results) No results for input(s): PROBNP in the last 8760 hours.  CBG:  Recent Labs Lab 10/27/14 1331 10/27/14 1714 10/27/14 2131 10/28/14 1449 10/28/14 1706   GLUCAP 192* 281* 257* 150* 221*    Recent Results (from the past 240 hour(s))  MRSA PCR Screening     Status: None   Collection Time: 10/23/14 11:43 PM  Result Value Ref Range Status   MRSA by PCR NEGATIVE NEGATIVE Final    Comment:        The GeneXpert MRSA Assay (FDA approved for NASAL specimens only), is one component of a comprehensive MRSA colonization surveillance program. It is not intended to diagnose MRSA infection nor to guide or monitor treatment for MRSA infections.      Studies: No results found.  Scheduled Meds: . amiodarone  100 mg Oral Daily  . antiseptic oral rinse  7 mL Mouth Rinse BID  . [START ON 10/29/2014] aspirin  81 mg Oral Daily  . atorvastatin  40 mg Oral q1800  . calcitRIOL  1.25 mcg Oral Q M,W,F-HD  . calcium acetate  2,001 mg Oral TID WC  . dorzolamide-timolol  1 drop Left Eye TID AC & HS  . insulin aspart  0-15 Units Subcutaneous TID WC  . kidney failure book   Does not apply Once  . metoprolol  50 mg Oral Once per day on Sun Tue Thu Sat  . mometasone-formoterol  2 puff Inhalation BID  . prednisoLONE acetate  1 drop Left Eye TID AC & HS  . sodium chloride  3 mL Intravenous Q12H  . sodium chloride  3 mL Intravenous Q12H  . sodium chloride  3 mL Intravenous Q12H  . ticagrelor  90 mg Oral BID   Continuous Infusions:     Time spent: 25 minutes    Kylar Leonhardt, [place]  [facility] Pager [phone] If 7PM-7AM, please contact night-coverage at [email or web address], password [facility] 10/28/2014, 5:38 PM  LOS: 5 days

## 2014-10-28 NOTE — Interval H&P Note (Signed)
History and Physical Interval Note:  10/28/2014 12:11 PM  [name]  has presented today for surgery, with the diagnosis of CAD  The various methods of treatment have been discussed with the patient and family. After consideration of risks, benefits and other options for treatment, the patient has consented to  Procedure(s): Coronary Stent Intervention (N/A) as a surgical intervention .  The patient's history has been reviewed, patient examined, no change in status, stable for surgery.  I have reviewed the patient's chart and labs.  Questions were answered to the patient's satisfaction.   Cath Lab Visit (complete for each Cath Lab visit)  Clinical Evaluation Leading to the Procedure:   ACS: Yes.    Non-ACS:    Anginal Classification: CCS IV  Anti-ischemic medical therapy: No Therapy  Non-Invasive Test Results: No non-invasive testing performed  Prior CABG: No previous CABG        [name] 10/28/2014 12:12 PM

## 2014-10-28 NOTE — Progress Notes (Signed)
Patient transferred to [facility] from [place] after a cath lab procedure. Patient states she had a large bottle of M9 deodorizer that she uses in her ostomy bag, but this bottle did not come to [facility] with her other belongings. She stated she purchased this at an outpatient pharmacy and it's $42 for the bottle. Patient's daughter at bedside stated she went through patient's belonging bags and was unable to locate this bottle. I also went through all patient's belongings and patient's previous room without success. Informed patient and patient's daughter the next step would be to file a complaint with security. [place] RN notified of this.  Joellen Jersey, RN.

## 2014-10-28 NOTE — Progress Notes (Signed)
[place] for heparin Indication: chest pain/ACS  Allergies  Allergen Reactions  . Ace Inhibitors Cough  . Eggs Or Egg-Derived Products Nausea And Vomiting  . Enalapril Cough  . Lisinopril Cough  . Omnipaque [Iohexol] Hives    Patient Measurements: Height: 5' (152.4 cm) Weight: 193 lb (87.544 kg) IBW/kg (Calculated) : 45.5 Heparin Dosing Weight: 65kg  Vital Signs: Temp: 97.4 F (36.3 C) (10/19 0438) Temp Source: Oral (10/19 0438) BP: 139/73 mmHg (10/19 0438) Pulse Rate: 80 (10/19 0438)  Labs:  Recent Labs  10/25/14 0545 10/26/14 0521 10/27/14 0351 10/28/14 0349  HGB 9.6* 10.4* 11.5* 11.8*  HCT 30.2* 33.2* 36.7 37.4  PLT 255 271 269 276  HEPARINUNFRC  --   --   --  <0.10*  CREATININE 5.89* 7.64* 5.55*  --     Estimated Creatinine Clearance: 9.5 mL/min (by C-G formula based on Cr of 5.55).  Assessment: 69 YO Female  admitted with chest pain, s/p cath, awaiting possible PCI, for heparin  Goal of Therapy:  Heparin level 0.3-0.7 units/ml Monitor platelets by anticoagulation protocol: Yes   Plan:  Increase Heparin 1050 units/hr Check heparin level in 8 hours.   Phillis Knack, PharmD, BCPS   10/28/2014 5:11 AM

## 2014-10-28 NOTE — Progress Notes (Signed)
Subjective:  Awoken from sleep in room sp cardiac cath no co  Objective Vital signs in last 24 hours: Filed Vitals:   10/28/14 0946 10/28/14 1019 10/28/14 1045 10/28/14 1112  BP: 125/58 127/43 133/59 125/83  Pulse: 89 87 89 89  Temp:    97.4 F (36.3 C)  TempSrc:    Oral  Resp:    20  Height:      Weight:    88.2 kg (194 lb 7.1 oz)  SpO2:    99%   Weight change: -0.4 kg (-14.1 oz)  Physical Exam:  General:   NAD, OX3 , appropriate  Lungs: Slightly Decr . BS throughout . Breathing unlabored Heart: RRR no mur / gallop/ Or rub  Abdomen: Obese Soft, non-tender, non-distended with ostomy bag  in place . Extremities:  No pedal edema / right transmet well healed Dialysis Access: L LA AVF + bruit   [facility] 4h F160 LUA AVF Hep 3000 3/2.25 bath 91kg (leaving under dry wt) Mircera 75 q 2 weeks - given 10/12 Calcitriol 1.25 Labs: Hgb 11.4 10/12 27% sat in Sept iPTH 223 K 3.7 - 3.9 on 3 K bath Ca ok P +/- 5.5 on 4 phoslo ac  Assessment: 1. SOB / vol^ / pulm edema - resolved, due lean body wt loss, new dry wt around 88kg.  2. +NSTEMI - has CAD by cath, plan is PCI to the 1st diagonal/ramus branch of the LAD by cardiology today 3. ESRD HD mwf 4. HTN on MTP only  5. 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 Nutrition - needs renal diet/vit 8. Diabetes - per primary 9. Atrial tachycardia - on BB on non HD days and amiodarone - followed by Dr. Caryl Comes  Plan - HD today  Kelly Splinter MD [employer] pager [phone]    cell [phone] 10/28/2014, 11:34 AM    Labs: Basic Metabolic Panel:  Recent Labs Lab 10/26/14 0521 10/27/14 0351 10/28/14 0349  NA 134* 137 135  K 3.7 4.2 5.2*  CL 94* 99* 93*  CO2 25 27 26   GLUCOSE 163* 168* 341*  BUN 53* 28* 54*  CREATININE 7.64* 5.55* 7.66*  CALCIUM 9.1  8.8* 9.1   CBC:  Recent Labs Lab 10/23/14 1327 10/24/14 0519 10/25/14 0545 10/26/14 0521 10/27/14 0351 10/28/14 0349  WBC 11.2* 7.7 8.7 8.8 7.2 8.9  NEUTROABS 9.1*  --   --   --   --   --   HGB 10.7* 10.6* 9.6* 10.4* 11.5* 11.8*  HCT 34.1* 34.4* 30.2* 33.2* 36.7 37.4  MCV 90.5 90.8 89.3 89.0 90.8 89.3  PLT 165 226 255 271 269 276   Cardiac Enzymes:  Recent Labs Lab 10/24/14 0116 10/24/14 0519 10/24/14 1200  TROPONINI 1.90* 3.07* 2.92*   CBG:  Recent Labs Lab 10/27/14 0809 10/27/14 1209 10/27/14 1331 10/27/14 1714 10/27/14 2131  GLUCAP 146* 138* 192* 281* 257*    Studies/Results: No results found. Medications: . sodium chloride 10 mL/hr at 10/28/14 0631  . heparin 1,050 Units/hr (10/28/14 0525)   . amiodarone  100 mg Oral Daily  . antiseptic oral rinse  7 mL Mouth Rinse BID  . [START ON 10/29/2014] aspirin  325 mg Oral Daily  . atorvastatin  40 mg Oral q1800  .  calcitRIOL  1.25 mcg Oral Q M,W,F-HD  . calcium acetate  2,001 mg Oral TID WC  . diphenhydrAMINE  25 mg Intravenous Pre-Cath  . dorzolamide-timolol  1 drop Left Eye TID AC & HS  . famotidine (PEPCID) IV  20 mg Intravenous Pre-Cath  . insulin aspart  0-15 Units Subcutaneous TID WC  . kidney failure book   Does not apply Once  . methylPREDNISolone (SOLU-MEDROL) injection  125 mg Intravenous Pre-Cath  . metoprolol  50 mg Oral Once per day on Sun Tue Thu Sat  . mometasone-formoterol  2 puff Inhalation BID  . prednisoLONE acetate  1 drop Left Eye TID AC & HS  . sodium chloride  3 mL Intravenous Q12H  . sodium chloride  3 mL Intravenous Q12H  . sodium chloride  3 mL Intravenous Q12H  . sodium chloride  3 mL Intravenous Q12H  . ticagrelor  180 mg Oral Once

## 2014-10-29 ENCOUNTER — Encounter (HOSPITAL_COMMUNITY): Payer: Self-pay | Admitting: Cardiology

## 2014-10-29 ENCOUNTER — Telehealth: Payer: Self-pay | Admitting: Internal Medicine

## 2014-10-29 DIAGNOSIS — E119 Type 2 diabetes mellitus without complications: Secondary | ICD-10-CM

## 2014-10-29 DIAGNOSIS — I5043 Acute on chronic combined systolic (congestive) and diastolic (congestive) heart failure: Secondary | ICD-10-CM

## 2014-10-29 DIAGNOSIS — Z955 Presence of coronary angioplasty implant and graft: Secondary | ICD-10-CM

## 2014-10-29 DIAGNOSIS — I214 Non-ST elevation (NSTEMI) myocardial infarction: Principal | ICD-10-CM

## 2014-10-29 DIAGNOSIS — Z794 Long term (current) use of insulin: Secondary | ICD-10-CM

## 2014-10-29 LAB — BASIC METABOLIC PANEL
Anion gap: 15 (ref 5–15)
BUN: 32 mg/dL — AB (ref 6–20)
CHLORIDE: 94 mmol/L — AB (ref 101–111)
CO2: 24 mmol/L (ref 22–32)
Calcium: 8.3 mg/dL — ABNORMAL LOW (ref 8.9–10.3)
Creatinine, Ser: 5.16 mg/dL — ABNORMAL HIGH (ref 0.44–1.00)
GFR calc Af Amer: 9 mL/min — ABNORMAL LOW (ref >60)
GFR calc non Af Amer: 8 mL/min — ABNORMAL LOW (ref >60)
GLUCOSE: 263 mg/dL — AB (ref 65–99)
POTASSIUM: 4.6 mmol/L (ref 3.5–5.1)
Sodium: 133 mmol/L — ABNORMAL LOW (ref 135–145)

## 2014-10-29 LAB — CBC
HEMATOCRIT: 33.9 % — AB (ref 36.0–46.0)
Hemoglobin: 10.7 g/dL — ABNORMAL LOW (ref 12.0–15.0)
MCH: 28 pg (ref 26.0–34.0)
MCHC: 31.6 g/dL (ref 30.0–36.0)
MCV: 88.7 fL (ref 78.0–100.0)
Platelets: 330 10*3/uL (ref 150–400)
RBC: 3.82 MIL/uL — ABNORMAL LOW (ref 3.87–5.11)
RDW: 16.3 % — AB (ref 11.5–15.5)
WBC: 14.1 10*3/uL — ABNORMAL HIGH (ref 4.0–10.5)

## 2014-10-29 LAB — GLUCOSE, CAPILLARY
Glucose-Capillary: 285 mg/dL — ABNORMAL HIGH (ref 65–99)
Glucose-Capillary: 309 mg/dL — ABNORMAL HIGH (ref 65–99)

## 2014-10-29 MED ORDER — INSULIN ASPART 100 UNIT/ML ~~LOC~~ SOLN
8.0000 [IU] | Freq: Once | SUBCUTANEOUS | Status: AC
  Administered 2014-10-29: 8 [IU] via SUBCUTANEOUS

## 2014-10-29 MED ORDER — SIMVASTATIN 10 MG PO TABS: 10.0000 mg | ORAL_TABLET | Freq: Every evening | ORAL | Status: DC

## 2014-10-29 MED ORDER — TICAGRELOR 90 MG PO TABS: 90.0000 mg | ORAL_TABLET | Freq: Two times a day (BID) | ORAL | Status: DC

## 2014-10-29 MED ORDER — NITROGLYCERIN 0.4 MG SL SUBL: 0.4000 mg | SUBLINGUAL_TABLET | SUBLINGUAL | Status: DC | PRN

## 2014-10-29 MED ORDER — AMIODARONE HCL 200 MG PO TABS: 100.0000 mg | ORAL_TABLET | Freq: Every day | ORAL | Status: DC

## 2014-10-29 MED ORDER — ASPIRIN 81 MG PO TABS: 81.0000 mg | ORAL_TABLET | Freq: Every day | ORAL | Status: AC

## 2014-10-29 MED ORDER — METOPROLOL TARTRATE 50 MG PO TABS: 50.0000 mg | ORAL_TABLET | ORAL | Status: DC

## 2014-10-29 NOTE — Progress Notes (Addendum)
CM spoke with pt regarding Brilinta and provided pt with booklet with 30 day free card enclosed. CM explained usage of card and pt verbally stated understanding of how to use card. Benefits check: Per rep at [facility]: $129.00 for 30 day retail / 356.00 for 90 day mail order / no auth required     Patient can use @ any retail pharmacy. CM made pt aware and provided pt with AZ&ME pt's assistance form for potential help with medication cost. [facility] called per CM and confirmed medication is in stock, CM shared information with pt. No other needs identified per CM @ present time. Whitman Hero RN,BSN,CM [phone]

## 2014-10-29 NOTE — Progress Notes (Signed)
Subjective:  No cos / tolerated HD yest and noted also yest stenting Prox. RCA lesion /"ready to go home"  Objective Vital signs in last 24 hours: Filed Vitals:   10/28/14 2000 10/29/14 0100 10/29/14 0200 10/29/14 0400  BP: 112/40 105/58 141/54 131/62  Pulse: 89   84  Temp: 97.7 F (36.5 C)   97.9 F (36.6 C)  TempSrc: Axillary   Oral  Resp: 20 17 14 14   Height:      Weight:    87.9 kg (193 lb 12.6 oz)  SpO2: 97%   100%   Weight change: -1.8 kg (-3 lb 15.5 oz)  Physical Exam: General:  NAD, OX3 , appropriate  Lungs: CTA Breathing unlabored Heart: RRR no mur / gallop/ Or rub  Abdomen: Obese Soft, non-tender, non-distended with ostomy bag in place . Extremities: No pedal edema / right transmet well healed Dialysis Access: L LA AVF + bruit  [facility] HD: [facility] M[facility] 4h F160 LUA AVF Hep 3000 3/2.25 bath 91kg (leaving under dry wt) Mircera 75 q 2 weeks - given 10/12 Calcitriol 1.25 Labs: Hgb 11.4 10/12 27% sat in Sept iPTH 223 K 3.7 - 3.9 on 3 K bath Ca ok P +/- 5.5 on 4 phoslo ac  Assessment: 1. SOB / vol^ / pulm edema / NSTEMI wirh CAD- resolved, due lean body wt loss, new dry wt around 88kg. 2. +NSTEMI - CAD by cath, repeat card cath 10/19 a new stent placed over 95% stenosed 1st diagonal w good results. Proximal RCA to mid RCA lesion was 95% stenosed but could not be stented. Stable post procedure 3. ESRD HD mwf lower edw to 88kg 4. HTN on MTP only  5. Anemia - Hgb 10.6 ---> 9.6 > [id number] had Mircera 75 10/12 - And due next week ,no esa in hosp So far with stable hgb  6. MBD - on  calcitriol 1.25 On HD on phoslo ac tid 7. Nutrition - needs renal diet/vit 8. Diabetes - per primary 9. Atrial tachycardia - on BB on non HD days and amiodarone - followed by Dr. Felecia Shelling, PA-C [facility] Beeper [phone] 10/29/2014,8:15 AM  LOS: 6 days    Pt seen, examined and agree w A/P as above.  Kelly Splinter MD [facility] pager [phone]    cell [phone] 10/29/2014, 1:48 PM     Labs: Basic Metabolic Panel:  Recent Labs Lab 10/27/14 0351 10/28/14 0349 10/29/14 0505  NA 137 135 133*  K 4.2 5.2* 4.6  CL 99* 93* 94*  CO2 27 26 24   GLUCOSE 168* 341* 263*  BUN 28* 54* 32*  CREATININE 5.55* 7.66* 5.16*  CALCIUM 8.8* 9.1 8.3*   Liver Function Tests: No results for input(s): AST, ALT, ALKPHOS, BILITOT, PROT, ALBUMIN in the last 168 hours. No results for input(s): LIPASE, AMYLASE in the last 168 hours. No results for input(s): AMMONIA in the last 168 hours. CBC:  Recent Labs Lab 10/23/14 1327  10/25/14 0545 10/26/14 0521 10/27/14 0351 10/28/14 0349 10/29/14 0505  WBC 11.2*  < > 8.7 8.8 7.2 8.9 14.1*  NEUTROABS 9.1*  --   --   --   --   --   --   HGB 10.7*  < > 9.6* 10.4* 11.5* 11.8* 10.7*  HCT 34.1*  < > 30.2* 33.2* 36.7 37.4 33.9*  MCV 90.5  < > 89.3 89.0 90.8 89.3 88.7  PLT 165  < > 255 271 269 276 330  < > =  values in this interval not displayed. Cardiac Enzymes:  Recent Labs Lab 10/24/14 0116 10/24/14 0519 10/24/14 1200  TROPONINI 1.90* 3.07* 2.92*   CBG:  Recent Labs Lab 10/28/14 1449 10/28/14 1706 10/28/14 2154 10/29/14 0006 10/29/14 0601  GLUCAP 150* 221* 271* 309* 285*    Studies/Results: No results found. Medications:   . amiodarone  100 mg Oral Daily  . antiseptic oral rinse  7 mL Mouth Rinse BID  . aspirin  81 mg Oral Daily  . atorvastatin  40 mg Oral q1800  . calcitRIOL  1.25 mcg Oral Q M,W,F-HD  . calcium acetate  2,001 mg Oral TID WC  . dorzolamide-timolol  1 drop Left Eye TID AC & HS  . insulin aspart  0-15 Units Subcutaneous TID WC  . kidney failure book   Does not apply Once  . metoprolol  50 mg Oral Once per day on Sun Tue Thu Sat  . mometasone-formoterol  2 puff Inhalation BID  . prednisoLONE acetate  1 drop Left Eye TID AC & HS  . sodium chloride  3 mL Intravenous Q12H  . sodium chloride  3 mL Intravenous Q12H  . sodium  chloride  3 mL Intravenous Q12H  . ticagrelor  90 mg Oral BID

## 2014-10-29 NOTE — Progress Notes (Signed)
Patient's ACT 196, Dr. Candyce Churn informed of pt's venous and article sheath site oozing and ACT result.  New order received from Dr. Candyce Churn to pull sheath at current ACT level of 196.

## 2014-10-29 NOTE — Telephone Encounter (Signed)
Pt is going to call [employer] office and change apt. She has dialysis on Mondays

## 2014-10-29 NOTE — Discharge Instructions (Addendum)
Heart Attack A heart attack (myocardial infarction, MI) causes damage to the heart that cannot be fixed. A heart attack often happens when a blood clot or other blockage cuts blood flow to the heart. When this happens, certain areas of the heart begin to die. This causes the pain you feel during a heart attack. HOME CARE  Take medicine as told by your doctor. You may need medicine to:  Keep your blood from clotting too easily.  Control your blood pressure.  Lower your cholesterol.  Control abnormal heart rhythms.  Change certain behaviors as told by your doctor. This may include:  Quitting smoking.  Being active.  Eating a heart-healthy diet. Ask your doctor for help with this diet.  Keeping a healthy weight.  Keeping your diabetes under control.  Lessening stress.  Limiting how much alcohol you drink. Do not take these medicines unless your doctor says that you can:  Nonsteroidal anti-inflammatory drugs (NSAIDs). These include:  Ibuprofen.  Naproxen.  Celecoxib.  Vitamin supplements that have vitamin A, vitamin E, or both.  Hormone therapy that contains estrogen with or without progestin. GET HELP RIGHT AWAY IF:  You have sudden chest discomfort.  You have sudden discomfort in your:  Arms.  Back.  Neck.  Jaw.  You have shortness of breath at any time.  You have sudden sweating or clammy skin.  You feel sick to your stomach (nauseous) or throw up (vomit).  You suddenly get light-headed or dizzy.  You feel your heart beating fast or skipping beats. These symptoms may be an emergency. Do not wait to see if the symptoms will go away. Get medical help right away. Call your local emergency services (911 in the [place] Do not drive yourself to the hospital.   This information is not intended to replace advice given to you by your health care provider. Make sure you discuss any questions you have with your health care provider.   Document Released:  06/27/2011 Document Revised: 05/12/2014 Document Reviewed: 02/28/2013 Elsevier Interactive Patient Education 2016 [place] at [id number] if any bleeding, swelling or drainage at cath site.  May shower, no tub baths for 48 hours for groin sticks. No lifting over 5 pounds for 5 days.  No Driving for 5 days

## 2014-10-29 NOTE — Progress Notes (Signed)
Patient Name: [name] Date of Encounter: 10/29/2014     Principal Problem:   Acute respiratory failure with hypoxia (HCC) Active Problems:   LBBB (left bundle branch block)   ESRD on dialysis [facility])   NSTEMI (non-ST elevated myocardial infarction) ([place])   Shortness of breath   IDDM (insulin dependent diabetes mellitus) (HCC)   Elevated troponin   Congestive heart failure (CHF) (HCC)   Acute hypoxemic respiratory failure (HCC)   Non-ST elevated myocardial infarction (non-STEMI) ([place])   Ischemic cardiomyopathy    SUBJECTIVE  The patient feels well today.    No CP , no dyspnea.  Has had deteriation of her LV function .  Cath shows several lesions. 1. Prox RCA to Mid RCA lesion, 95% stenosed -- new. Post Atrio lesion, 80% stenosed. Ost RPDA lesion, 100% stenosed - previously described. 2. Mid Cx lesion, 75% stenosed. Ost 2nd Mrg lesion, 80% stenosed. 2nd Mrg lesion, 60% stenosed. 3. Mid Cx lesion, 75% stenosed. 4. Prox LAD to Mid LAD lesion, 30% stenosed. Dist LAD lesion, 90% stenosed. 5. 1st Diag lesion, 95% stenosed. 6. Ost 2nd Diag to 2nd Diag lesion, 50% stenosed. 2nd Diag lesion, 60% stenosed. Lat 2nd Diag lesion, 80% stenosed. 7. There is moderate left ventricular systolic dysfunction.  Had  PCI of her D1 / Ramus intermediate branch   doing well. Ready for DC   CURRENT MEDS . amiodarone  100 mg Oral Daily  . antiseptic oral rinse  7 mL Mouth Rinse BID  . aspirin  81 mg Oral Daily  . atorvastatin  40 mg Oral q1800  . calcitRIOL  1.25 mcg Oral Q M,W,F-HD  . calcium acetate  2,001 mg Oral TID WC  . dorzolamide-timolol  1 drop Left Eye TID AC & HS  . insulin aspart  0-15 Units Subcutaneous TID WC  . kidney failure book   Does not apply Once  . metoprolol  50 mg Oral Once per day on Sun Tue Thu Sat  . mometasone-formoterol  2 puff Inhalation BID  . prednisoLONE acetate  1 drop Left Eye TID AC & HS  . sodium chloride  3 mL Intravenous Q12H  . sodium  chloride  3 mL Intravenous Q12H  . sodium chloride  3 mL Intravenous Q12H  . ticagrelor  90 mg Oral BID    OBJECTIVE  Filed Vitals:   10/29/14 0100 10/29/14 0200 10/29/14 0400 10/29/14 0817  BP: 105/58 141/54 131/62 147/64  Pulse:   84 91  Temp:   97.9 F (36.6 C) 97.7 F (36.5 C)  TempSrc:   Oral Oral  Resp: 17 14 14 16   Height:      Weight:   87.9 kg (193 lb 12.6 oz)   SpO2:   100% 100%    Intake/Output Summary (Last 24 hours) at 10/29/14 0859 Last data filed at 10/29/14 0000  Gross per 24 hour  Intake    240 ml  Output   1835 ml  Net  -1595 ml   Filed Weights   10/28/14 0654 10/28/14 1112 10/29/14 0400  Weight: 90 kg (198 lb 6.6 oz) 88.2 kg (194 lb 7.1 oz) 87.9 kg (193 lb 12.6 oz)    PHYSICAL EXAM  General: Pleasant, NAD. Neuro: Alert and oriented X 3. Moves all extremities spontaneously. Psych: Normal affect. HEENT:  Normal  Neck: Supple without bruits or JVD. Lungs:  Resp regular and unlabored, CTA. Heart: RRR no s3, s4.  There is a grade 3-2/4 systolic ejection murmur at  the aortic area. Abdomen: Soft, non-tender, non-distended.  Colostomy and urostomy sites are unremarkable.  Extremities: No peripheral edema  Accessory Clinical Findings  CBC  Recent Labs  10/28/14 0349 10/29/14 0505  WBC 8.9 14.1*  HGB 11.8* 10.7*  HCT 37.4 33.9*  MCV 89.3 88.7  PLT 276 597   Basic Metabolic Panel  Recent Labs  10/28/14 0349 10/29/14 0505  NA 135 133*  K 5.2* 4.6  CL 93* 94*  CO2 26 24  GLUCOSE 341* 263*  BUN 54* 32*  CREATININE 7.66* 5.16*  CALCIUM 9.1 8.3*   Liver Function Tests No results for input(s): AST, ALT, ALKPHOS, BILITOT, PROT, ALBUMIN in the last 72 hours. No results for input(s): LIPASE, AMYLASE in the last 72 hours. Cardiac Enzymes No results for input(s): CKTOTAL, CKMB, CKMBINDEX, TROPONINI in the last 72 hours. BNP Invalid input(s): POCBNP D-Dimer No results for input(s): DDIMER in the last 72 hours. Hemoglobin A1C No results  for input(s): HGBA1C in the last 72 hours. Fasting Lipid Panel No results for input(s): CHOL, HDL, LDLCALC, TRIG, CHOLHDL, LDLDIRECT in the last 72 hours. Thyroid Function Tests No results for input(s): TSH, T4TOTAL, T3FREE, THYROIDAB in the last 72 hours.  Invalid input(s): FREET3  TELE  Normal sinus rhythm  ECG  Radiology/Studies  Dg Chest 2 View  10/23/2014  CLINICAL DATA:  Shortness of Breath EXAM: CHEST  2 VIEW COMPARISON:  September 22, 2013 FINDINGS: There is mild generalized interstitial edema. There is cardiomegaly with pulmonary venous hypertension. No airspace consolidation. There is a minimal right pleural effusion. No adenopathy. No bone lesions. There is atherosclerotic change in the aorta. IMPRESSION: Findings consistent with a degree of congestive heart failure. No airspace consolidation. Electronically Signed   By: Lowella Grip III M.D.   On: 10/23/2014 12:55    ASSESSMENT AND PLAN 1. CAD:  As multiple lesions as described above. Doing well after PCI  Continue ASA 81 a day and Brilinta 90 mg BID   2. ESRD:  Per nephrololgy .  Discussed the cath with Dr.  Jonnie Finner. Continue HD   3. Atrial tachycardia Continue amiodarone. Will need a refill  4. Hyperlipidemia:  5. DM - further management per Int. Med.   6. Chronic diastolic CHF:  7.  LBBB  8. Essential HTN  9.    OK to go home today  Please send home on PRN SL NTG also   Lenell Lama, Wonda Cheng, MD  10/29/2014 8:59 AM    [address],  [place], [place]  [address] Pager [phone] Phone: [phone]; Fax: [phone]   [facility]  [address] [place], [place]  [address] [phone]   Fax [phone]

## 2014-10-29 NOTE — Progress Notes (Signed)
Site area: right groin  Site Prior to Removal:  Level 0  Pressure Applied For 30 MINUTES    Minutes Beginning at 23:30  Manual:   Yes.    Patient Status During Pull:  AxOx3  Post Pull Groin Site:  Level 0  Post Pull Instructions Given:  Yes.    Post Pull Pulses Present:  Yes.    Dressing Applied:  Yes.    Comments:  Pt tolerated sheath removal without complications, will continue to monitor patient and right groin site.

## 2014-10-29 NOTE — Discharge Summary (Signed)
Physician Discharge Summary  [name] BSJ:[id number] DOB: 06/01/1945 DOA: 10/23/2014  PCP: Walker Kehr, MD  Admit date: 10/23/2014 Discharge date: 10/29/2014  Time spent: 35 minutes  Recommendations for Outpatient Follow-up:  1. Discharge home with outpatient follow-up with cardiology and scheduled dialysis. 2. Patient had cardiac cath with DES placed this admission.  Discharge Diagnoses:  Principal Problem:   Non-ST elevated myocardial infarction (non-STEMI) (HCC)  Active Problems:    ESRD on dialysis ([place])   Shortness of breath   IDDM (insulin dependent diabetes mellitus) (HCC)   Elevated troponin   Congestive heart failure (CHF) (HCC)   Acute respiratory failure with hypoxia (HCC)   Ischemic cardiomyopathy   LBBB (left bundle branch block)   Coronary artery disease   Discharge Condition: Fair  Diet recommendation: Renal/heart healthy/diabetic  Filed Weights   10/28/14 0654 10/28/14 1112 10/29/14 0400  Weight: 90 kg (198 lb 6.6 oz) 88.2 kg (194 lb 7.1 oz) 87.9 kg (193 lb 12.6 oz)    History of present illness:  Reason for to admission H&P for details, in brief, 69 year old with ESRD on HD presented with shortness of breath with acute hypoxic resp failure. Her SOB improved after HD, but she was also found to have troponin up to 3.0, cardiology consulted .[name] Her 2-D echo showed drop off the LVEF from 55% to 35%. Cardiac cath done on 10/18 showed multivessel CAD mostly involving small vessels not suitable to PCI. She had repeat cardia cath done on 10/19 with stent placed over 95% stenosed 1st diagonal. Proximal RCA to mid RCA lesion was 95% stenosed but could not be stented due to inability to cross with a balloon.  Hospital Course:  Non-STEMI 2D echo with EF of 35% ( drop from 55%) with elevated troponin. Cardiac cath done with results outlined above.( DES to 1st diagonal) . Needs dual antiplatelet ( ASA and brillinta) for at least 1 year. Patient was on full  dose aspirin and has been switched to baby aspirin.  stable overnight post cath. Remains chest pain free.  -Right femoral groin (cath site) is clean without any bleeding. -Patient to continue metoprolol (takes on nondialysis days to prevent hypotension), and Lipitor. Also provided prescriptions for sublingual nitrate for prn use for chest pain symptoms. -Patient provided with instructions on monitoring her diet and medications, weight monitoring, and compliance with her diagnosis and outpatient follow-up. -Home health PT will be arranged.  Acute hypoxic respiratory failure Secondary to ischemic cardiomyopathy with NSTEMI. Symptoms rsolved with HD. Cardiac cath results as above. Received scheduled HD while in the hospital.   Ischemic cardiomyopathy secondary to NSTEMI. Currently euvolemic. Continue ASA (switch to baby aspirin), BB and lipitor. (Refills provided.)  ESRD Dialysis days Monday, Wednesday and Friday, with evidence of pulmonary edema on x-ray. Low EF of 35% due to ischemic cardiomyopathy. Symptoms resolved with HD. Follow up with scheduled dialysis as outpatient.   Diabetes mellitus type 2 A1C of 6.5. On prandin at home which is resumed.    Atrial tachycardia stable on amiodarone. Refills provided    Code Status: Full Code  Family Communication: none at bedside  Disposition Plan: home    Consultants:  cardiology.  Nephrology  Procedures:  Hemodialysis  Cardiac cath with  DES  Antibiotics:  None Discharge Exam: Filed Vitals:   10/29/14 0817  BP: 147/64  Pulse: 91  Temp: 97.7 F (36.5 C)  Resp: 16    General: Elderly female in no acute distress HEENT: No pallor, moist oral mucosa, supple  neck Chest: Clear to auscultation bilaterally, no added sounds CVS: Normal S1 and S2, no murmurs rub or gallop GI: Soft, nondistended, colostomy bag in place, bowel sounds present, nontender Musculoskeletal: Warm, right femoral groin (cath site) clean  without further bleeding or bruising, CNS: Alert and oriented   Discharge Instructions    Current Discharge Medication List    START taking these medications   Details  nitroGLYCERIN (NITROSTAT) 0.4 MG SL tablet Place 1 tablet (0.4 mg total) under the tongue every 5 (five) minutes as needed for chest pain. Qty: 30 tablet, Refills: 3    ticagrelor (BRILINTA) 90 MG TABS tablet Take 1 tablet (90 mg total) by mouth 2 (two) times daily. Qty: 60 tablet, Refills: 0      CONTINUE these medications which have CHANGED   Details  amiodarone (PACERONE) 200 MG tablet Take 0.5 tablets (100 mg total) by mouth daily. Qty: 45 tablet, Refills: 2    aspirin 81 MG tablet Take 1 tablet (81 mg total) by mouth daily. Qty: 30 tablet, Refills: 3    metoprolol (LOPRESSOR) 50 MG tablet Take 1 tablet (50 mg total) by mouth 4 (four) times a week. Take 50mg  once a day on Tuesday, Thursday, Saturday, and Sunday. Skipping Dialysis days. Qty: 60 tablet, Refills: 0    simvastatin (ZOCOR) 10 MG tablet Take 1 tablet (10 mg total) by mouth every evening. Qty: 30 tablet, Refills: 3      CONTINUE these medications which have NOT CHANGED   Details  acetaminophen (TYLENOL) 500 MG tablet Take 500 mg by mouth every 6 (six) hours as needed (pain).    calcium acetate (PHOSLO) 667 MG capsule Take 1,334-2,001 mg by mouth See admin instructions. Take 2001mg  three times daily with meals and Take 1334mg   with snacks.    dorzolamide-timolol (COSOPT) 22.3-6.8 MG/ML ophthalmic solution Place 1 drop into the left eye 4 (four) times daily. Refills: 12    Fluticasone-Salmeterol (ADVAIR) 250-50 MCG/DOSE AEPB Inhale 1 puff into the lungs daily as needed (shortness of breath).     prednisoLONE acetate (PRED FORTE) 1 % ophthalmic suspension Place 1 drop into the left eye 4 (four) times daily.    repaglinide (PRANDIN) 0.5 MG tablet Take 1 tablet (0.5 mg total) by mouth 3 (three) times daily before meals. Qty: 90 tablet, Refills:  11    glucose blood (ONE TOUCH ULTRA TEST) test strip Check blood sugar 2 time per day. DX code E11.9 Qty: 200 each, Refills: 2       Allergies  Allergen Reactions  . Ace Inhibitors Cough  . Eggs Or Egg-Derived Products Nausea And Vomiting  . Enalapril Cough  . Lisinopril Cough  . Omnipaque [Iohexol] Hives      The results of significant diagnostics from this hospitalization (including imaging, microbiology, ancillary and laboratory) are listed below for reference.    Significant Diagnostic Studies: Dg Chest 2 View  10/23/2014  CLINICAL DATA:  Shortness of Breath EXAM: CHEST  2 VIEW COMPARISON:  September 22, 2013 FINDINGS: There is mild generalized interstitial edema. There is cardiomegaly with pulmonary venous hypertension. No airspace consolidation. There is a minimal right pleural effusion. No adenopathy. No bone lesions. There is atherosclerotic change in the aorta. IMPRESSION: Findings consistent with a degree of congestive heart failure. No airspace consolidation. Electronically Signed   By: Lowella Grip III M.D.   On: 10/23/2014 12:55    Microbiology: Recent Results (from the past 240 hour(s))  MRSA PCR Screening     Status: None  Collection Time: 10/23/14 11:43 PM  Result Value Ref Range Status   MRSA by PCR NEGATIVE NEGATIVE Final    Comment:        The GeneXpert MRSA Assay (FDA approved for NASAL specimens only), is one component of a comprehensive MRSA colonization surveillance program. It is not intended to diagnose MRSA infection nor to guide or monitor treatment for MRSA infections.      Labs: Basic Metabolic Panel:  Recent Labs Lab 10/25/14 0545 10/26/14 0521 10/27/14 0351 10/28/14 0349 10/29/14 0505  NA 134* 134* 137 135 133*  K 5.1 3.7 4.2 5.2* 4.6  CL 94* 94* 99* 93* 94*  CO2 26 25 27 26 24   GLUCOSE 134* 163* 168* 341* 263*  BUN 34* 53* 28* 54* 32*  CREATININE 5.89* 7.64* 5.55* 7.66* 5.16*  CALCIUM 9.0 9.1 8.8* 9.1 8.3*   Liver  Function Tests: No results for input(s): AST, ALT, ALKPHOS, BILITOT, PROT, ALBUMIN in the last 168 hours. No results for input(s): LIPASE, AMYLASE in the last 168 hours. No results for input(s): AMMONIA in the last 168 hours. CBC:  Recent Labs Lab 10/23/14 1327  10/25/14 0545 10/26/14 0521 10/27/14 0351 10/28/14 0349 10/29/14 0505  WBC 11.2*  < > 8.7 8.8 7.2 8.9 14.1*  NEUTROABS 9.1*  --   --   --   --   --   --   HGB 10.7*  < > 9.6* 10.4* 11.5* 11.8* 10.7*  HCT 34.1*  < > 30.2* 33.2* 36.7 37.4 33.9*  MCV 90.5  < > 89.3 89.0 90.8 89.3 88.7  PLT 165  < > 255 271 269 276 330  < > = values in this interval not displayed. Cardiac Enzymes:  Recent Labs Lab 10/24/14 0116 10/24/14 0519 10/24/14 1200  TROPONINI 1.90* 3.07* 2.92*   BNP: BNP (last 3 results)  Recent Labs  10/23/14 1327  BNP 1300.0*    ProBNP (last 3 results) No results for input(s): PROBNP in the last 8760 hours.  CBG:  Recent Labs Lab 10/28/14 1449 10/28/14 1706 10/28/14 2154 10/29/14 0006 10/29/14 0601  GLUCAP 150* 221* 271* 309* 285*       Signed:  Klark Vanderhoef  [facility] 10/29/2014, 9:26 AM

## 2014-10-29 NOTE — Care Management Important Message (Signed)
Important Message  Patient Details  Name: [name] MRN: [id number] Date of Birth: 1945/09/12   Medicare Important Message Given:  Yes-third notification given    Delorse Lek 10/29/2014, 12:18 PM

## 2014-10-29 NOTE — Telephone Encounter (Signed)
New message    TOC appt on  10.31.2016 @ 8 am with Truitt Merle.

## 2014-10-29 NOTE — Progress Notes (Signed)
CARDIAC REHAB PHASE I   PRE:  Rate/Rhythm: 89 SR  BP:  Sitting: 147/64        SaO2: 98 RA  MODE:  Ambulation: 100 ft   POST:  Rate/Rhythm: 106 ST  BP:  Sitting: 119/42         SaO2: 98 RA  Pt ambulated 100 ft on RA, handheld assist, rolling walker, mostly steady gait, tolerated fair.  Pt c/o of fatigue, mild DOE, denies cp, dizziness, brief standing rest x4. Pt has a walker at home, would benefit from HHPT to maximize mobility/activity level. Completed MI/stent/CHF education.  Reviewed risk factors, anti-platelet therapy, stent card, activity restrictions, ntg, exercise, CHF booklet and zone tool, daily weights, sodium and fluid restrictions, and phase 2 cardiac rehab. Gave pt heart healthy diet and diabetes diet handouts, advised pt to discuss diet with dietician at dialysis for renal diet to identify areas for change/improvement. Pt verbalized understanding. Pt agrees to phase 2 cardiac rehab referral, will send to [facility], although pt states she may not be able to attend due to her dialysis schedule. Pt very receptive to education. Pt to recliner after walk, call bell within reach.   [phone] Lenna Sciara, RN, BSN 10/29/2014 9:32 AM

## 2014-10-30 ENCOUNTER — Telehealth: Payer: Self-pay | Admitting: *Deleted

## 2014-10-30 DIAGNOSIS — N2581 Secondary hyperparathyroidism of renal origin: Secondary | ICD-10-CM | POA: Diagnosis not present

## 2014-10-30 DIAGNOSIS — Z23 Encounter for immunization: Secondary | ICD-10-CM | POA: Diagnosis not present

## 2014-10-30 DIAGNOSIS — D631 Anemia in chronic kidney disease: Secondary | ICD-10-CM | POA: Diagnosis not present

## 2014-10-30 DIAGNOSIS — D509 Iron deficiency anemia, unspecified: Secondary | ICD-10-CM | POA: Diagnosis not present

## 2014-10-30 DIAGNOSIS — N186 End stage renal disease: Secondary | ICD-10-CM | POA: Diagnosis not present

## 2014-10-30 DIAGNOSIS — E119 Type 2 diabetes mellitus without complications: Secondary | ICD-10-CM | POA: Diagnosis not present

## 2014-10-30 NOTE — Telephone Encounter (Signed)
Pt was on tcm list D/C 10/29/14 had cardiac cath done on 10/27/14. Non-ST elevated myocardial infarction. Pt will be f/u with cardiologist Dr. Caryl Comes for hosp f/u...Johny Chess

## 2014-11-02 DIAGNOSIS — D509 Iron deficiency anemia, unspecified: Secondary | ICD-10-CM | POA: Diagnosis not present

## 2014-11-02 DIAGNOSIS — N2581 Secondary hyperparathyroidism of renal origin: Secondary | ICD-10-CM | POA: Diagnosis not present

## 2014-11-02 DIAGNOSIS — E119 Type 2 diabetes mellitus without complications: Secondary | ICD-10-CM | POA: Diagnosis not present

## 2014-11-02 DIAGNOSIS — Z23 Encounter for immunization: Secondary | ICD-10-CM | POA: Diagnosis not present

## 2014-11-02 DIAGNOSIS — N186 End stage renal disease: Secondary | ICD-10-CM | POA: Diagnosis not present

## 2014-11-02 DIAGNOSIS — D631 Anemia in chronic kidney disease: Secondary | ICD-10-CM | POA: Diagnosis not present

## 2014-11-03 DIAGNOSIS — D649 Anemia, unspecified: Secondary | ICD-10-CM | POA: Diagnosis not present

## 2014-11-03 DIAGNOSIS — E1122 Type 2 diabetes mellitus with diabetic chronic kidney disease: Secondary | ICD-10-CM | POA: Diagnosis not present

## 2014-11-03 DIAGNOSIS — Z936 Other artificial openings of urinary tract status: Secondary | ICD-10-CM | POA: Diagnosis not present

## 2014-11-03 DIAGNOSIS — Z48812 Encounter for surgical aftercare following surgery on the circulatory system: Secondary | ICD-10-CM | POA: Diagnosis not present

## 2014-11-03 DIAGNOSIS — I447 Left bundle-branch block, unspecified: Secondary | ICD-10-CM | POA: Diagnosis not present

## 2014-11-03 DIAGNOSIS — I5032 Chronic diastolic (congestive) heart failure: Secondary | ICD-10-CM | POA: Diagnosis not present

## 2014-11-03 DIAGNOSIS — I214 Non-ST elevation (NSTEMI) myocardial infarction: Secondary | ICD-10-CM | POA: Diagnosis not present

## 2014-11-03 DIAGNOSIS — N186 End stage renal disease: Secondary | ICD-10-CM | POA: Diagnosis not present

## 2014-11-03 DIAGNOSIS — Z992 Dependence on renal dialysis: Secondary | ICD-10-CM | POA: Diagnosis not present

## 2014-11-03 DIAGNOSIS — I132 Hypertensive heart and chronic kidney disease with heart failure and with stage 5 chronic kidney disease, or end stage renal disease: Secondary | ICD-10-CM | POA: Diagnosis not present

## 2014-11-03 DIAGNOSIS — Z85038 Personal history of other malignant neoplasm of large intestine: Secondary | ICD-10-CM | POA: Diagnosis not present

## 2014-11-03 DIAGNOSIS — M1990 Unspecified osteoarthritis, unspecified site: Secondary | ICD-10-CM | POA: Diagnosis not present

## 2014-11-03 DIAGNOSIS — I272 Other secondary pulmonary hypertension: Secondary | ICD-10-CM | POA: Diagnosis not present

## 2014-11-03 DIAGNOSIS — I471 Supraventricular tachycardia: Secondary | ICD-10-CM | POA: Diagnosis not present

## 2014-11-03 DIAGNOSIS — E785 Hyperlipidemia, unspecified: Secondary | ICD-10-CM | POA: Diagnosis not present

## 2014-11-03 DIAGNOSIS — K219 Gastro-esophageal reflux disease without esophagitis: Secondary | ICD-10-CM | POA: Diagnosis not present

## 2014-11-03 DIAGNOSIS — I255 Ischemic cardiomyopathy: Secondary | ICD-10-CM | POA: Diagnosis not present

## 2014-11-03 DIAGNOSIS — Z933 Colostomy status: Secondary | ICD-10-CM | POA: Diagnosis not present

## 2014-11-03 DIAGNOSIS — Z7982 Long term (current) use of aspirin: Secondary | ICD-10-CM | POA: Diagnosis not present

## 2014-11-03 DIAGNOSIS — H409 Unspecified glaucoma: Secondary | ICD-10-CM | POA: Diagnosis not present

## 2014-11-03 DIAGNOSIS — Z8673 Personal history of transient ischemic attack (TIA), and cerebral infarction without residual deficits: Secondary | ICD-10-CM | POA: Diagnosis not present

## 2014-11-03 DIAGNOSIS — I080 Rheumatic disorders of both mitral and aortic valves: Secondary | ICD-10-CM | POA: Diagnosis not present

## 2014-11-03 DIAGNOSIS — E669 Obesity, unspecified: Secondary | ICD-10-CM | POA: Diagnosis not present

## 2014-11-03 DIAGNOSIS — R2689 Other abnormalities of gait and mobility: Secondary | ICD-10-CM | POA: Diagnosis not present

## 2014-11-04 DIAGNOSIS — Z23 Encounter for immunization: Secondary | ICD-10-CM | POA: Diagnosis not present

## 2014-11-04 DIAGNOSIS — D509 Iron deficiency anemia, unspecified: Secondary | ICD-10-CM | POA: Diagnosis not present

## 2014-11-04 DIAGNOSIS — N186 End stage renal disease: Secondary | ICD-10-CM | POA: Diagnosis not present

## 2014-11-04 DIAGNOSIS — E119 Type 2 diabetes mellitus without complications: Secondary | ICD-10-CM | POA: Diagnosis not present

## 2014-11-04 DIAGNOSIS — E1129 Type 2 diabetes mellitus with other diabetic kidney complication: Secondary | ICD-10-CM | POA: Diagnosis not present

## 2014-11-04 DIAGNOSIS — D631 Anemia in chronic kidney disease: Secondary | ICD-10-CM | POA: Diagnosis not present

## 2014-11-04 DIAGNOSIS — N2581 Secondary hyperparathyroidism of renal origin: Secondary | ICD-10-CM | POA: Diagnosis not present

## 2014-11-06 DIAGNOSIS — D631 Anemia in chronic kidney disease: Secondary | ICD-10-CM | POA: Diagnosis not present

## 2014-11-06 DIAGNOSIS — N2581 Secondary hyperparathyroidism of renal origin: Secondary | ICD-10-CM | POA: Diagnosis not present

## 2014-11-06 DIAGNOSIS — D509 Iron deficiency anemia, unspecified: Secondary | ICD-10-CM | POA: Diagnosis not present

## 2014-11-06 DIAGNOSIS — E119 Type 2 diabetes mellitus without complications: Secondary | ICD-10-CM | POA: Diagnosis not present

## 2014-11-06 DIAGNOSIS — N186 End stage renal disease: Secondary | ICD-10-CM | POA: Diagnosis not present

## 2014-11-06 DIAGNOSIS — Z23 Encounter for immunization: Secondary | ICD-10-CM | POA: Diagnosis not present

## 2014-11-09 ENCOUNTER — Telehealth: Payer: Self-pay

## 2014-11-09 ENCOUNTER — Encounter: Payer: Medicare Other | Admitting: Nurse Practitioner

## 2014-11-09 DIAGNOSIS — N2581 Secondary hyperparathyroidism of renal origin: Secondary | ICD-10-CM | POA: Diagnosis not present

## 2014-11-09 DIAGNOSIS — N186 End stage renal disease: Secondary | ICD-10-CM | POA: Diagnosis not present

## 2014-11-09 DIAGNOSIS — E119 Type 2 diabetes mellitus without complications: Secondary | ICD-10-CM | POA: Diagnosis not present

## 2014-11-09 DIAGNOSIS — Z992 Dependence on renal dialysis: Secondary | ICD-10-CM | POA: Diagnosis not present

## 2014-11-09 DIAGNOSIS — Z23 Encounter for immunization: Secondary | ICD-10-CM | POA: Diagnosis not present

## 2014-11-09 DIAGNOSIS — D631 Anemia in chronic kidney disease: Secondary | ICD-10-CM | POA: Diagnosis not present

## 2014-11-09 DIAGNOSIS — D509 Iron deficiency anemia, unspecified: Secondary | ICD-10-CM | POA: Diagnosis not present

## 2014-11-09 DIAGNOSIS — E1129 Type 2 diabetes mellitus with other diabetic kidney complication: Secondary | ICD-10-CM | POA: Diagnosis not present

## 2014-11-09 NOTE — Progress Notes (Signed)
Cardiology Office Note   Date:  11/10/2014   ID:  Cynthia, Walls 07-27-1945, MRN [id number]  PCP:  Walker Kehr, MD  Cardiologist:  Dr. Virl Axe     Chief Complaint  Patient presents with  . Hospitalization Follow-up    s/p NSTEMI  . Coronary Artery Disease  . Congestive Heart Failure  . Cardiomyopathy     History of Present Illness: [name] is a 69 y.o. female with a hx of HTN, DM, ESRD on hemodialysis, EAT treated with Amiodarone, CAD s/p NSTEMI in 9767, systolic HF (EF improved), LBBB, severe mitral stenosis, colon CA.  Echo in 1/15 demonstrated EF 55-60%.  Admitted 10/14-10/20 with acute hypoxic respiratory failure in the setting of non-STEMI. She required urgent dialysis. Follow-up echocardiogram demonstrated worsening LV function with an EF of 35-40%. Cardiac catheterization was recommended. This demonstrated severe multivessel disease with diffuse calcified LAD and distal LAD disease in conjunction with new findings in the proximal D1 and mid RCA. Cardiac catheterization findings were discussed with interventional colleagues and the patient was brought back to the cardiac catheterization lab 10/28/14 and underwent PCI with a DES to the D1. Unsuccessful attempt was made at PCI of the proximal RCA. If the patient continued to have refractory angina, rotational atherectomy and stenting of the RCA could be considered.  Returns for follow-up. Since discharge, she has been doing fairly well. She denies any chest pain. She does note dyspnea with exertion. She notes it with just walking. She denies it at rest. Denies orthopnea or PND. Denies edema. She has been achieving her dry weight at dialysis. Denies syncope.   Studies/Reports Reviewed Today:  LHC 10/28/14 LAD: Heavily calcified throughout, proximal 30%, distal 90%, D1 95%, D2 50%, 60%, lateral D2 80% LCx: Mid 75%, OM1 80% ostial, OM 260% RCA: Proximal 95%, RPDA ostial occluded, posterior Atrio ventricular  branch 80% PCI: 2.25 x 12 mm Promus Premier DES to the D1; unsuccessful PCI of the proximal RCA 1. Successful stenting of the first diagonal with a DES 2. Unsuccessful stenting of the mid RCA due to inability to cross with a balloon. This despite good wire, guide, and Guideliner support. Plan: continue DAPT for one year. Optimize antianginal therapy. If she continues to have significant angina I would consider rotational atherectomy and stenting of the RCA.        LHC 10/27/14 1. Prox RCA to Mid RCA lesion, 95% stenosed -- new. Post Atrio lesion, 80% stenosed. Ost RPDA lesion, 100% stenosed - previously described. 2. Mid Cx lesion, 75% stenosed. Ost 2nd Mrg lesion, 80% stenosed. 2nd Mrg lesion, 60% stenosed. 3. Mid Cx lesion, 75% stenosed. 4. Prox LAD to Mid LAD lesion, 30% stenosed. Dist LAD lesion, 90% stenosed. 5. 1st Diag lesion, 95% stenosed. 6. Ost 2nd Diag to 2nd Diag lesion, 50% stenosed. 2nd Diag lesion, 60% stenosed. Lat 2nd Diag lesion, 80% stenosed. 7. There is moderate left ventricular systolic dysfunction. Severe multivessel disease with diffuse calcified LAD and distal LAD disease in conjunction with new findings of severe lesions in the proximal D1/ramus as well as mid RCA. There is stable existing disease in the AV groove circumflex-OM 2 bifurcation Potential culprit lesions include D1, mid RCA and distal RCA. As there is no clear-cut culprit lesion, the decision was made to stop with diagnostic imaging and discuss with colleagues for the best plan of action.  The distal LAD would likely require atherectomy, and the mid RCA is barely large enough for a stent  with known occlusion of the PDA branch. The largest first diagonal/ramus branch appears to be PCI amenable, however it is unclear if this is thrombus versus calcification. Second diagonal branch is smaller, with diffuse disease, but covers a large distribution.    Echo 10/25/14 Moderate LVH, EF 35-40%, mild AI, MAC,  moderate MR, mild LAE, PASP 56 mmHg   Past Medical History  Diagnosis Date  . Hyperlipidemia   . Mitral valve insufficiency and aortic valve insufficiency   . Cardiomyopathy- mixed   . 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 Chronic diastolic heart failure ([place])   . Anemia   . Coronary artery disease     Previously decreased EF; echo 113 normal LV function  . Hernia, incisional     abd  . History of colon cancer     history of colon cancer.  1986  . Arthritis   . LBBB (left bundle branch block)   . Atrial tachycardia ([place])     on amiod  . Hypertensive heart disease     sees Dr. Alain Marion  . Stroke [facility])     2011/12  . Obesity   . Hypertension   . Glaucoma   . Renal failure     ESRD, Dr Dunham/Dr. Lyda Kalata.  M, W, Fr  . GERD (gastroesophageal reflux disease)   . Cancer [facility])     colon cancer  . Colostomy in place [facility])     Past Surgical History  Procedure Laterality Date  . Colostomy  1986  . Revision urostomy cutaneous    . Esophagogastroduodenoscopy  03-18-04  . Electrocardiogram  04-27-06  . Placement of new left forearm arteriovenous graft  03-11-08  . Left heart catheterization and right heart catheterization  12-10    R. heart cath showed elevated left and right heart filling pressures w/ pulmonary artery pressure elevated mildly out of proportion to the wedge. The left heart cath showed diffuse distal vessel disease as well as a 75% stenosis in the mid circumflex w/ a 90% stenosis of the ostial first obtuse marginal. These lesions were in close proximity. there was a 60-70%mild RCA stenosis.   . Arteriovenous graft placement  2010  . 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 Cardiac catheterization    . Eye surgery      Cataract Left  . Pars plana vitrectomy  11/29/2011    Procedure: PARS PLANA VITRECTOMY WITH 23 GAUGE;  Surgeon: Adonis Brook, MD;  Location: [place];  Service:  Ophthalmology;  Laterality: Right;  Right Eye 23 ga vitrectomy with membrane peel  . Pars plana vitrectomy Left 02/28/2012    Procedure: PARS PLANA VITRECTOMY WITH 23 GAUGE;  Surgeon: Adonis Brook, MD;  Location: [place];  Service: Ophthalmology;  Laterality: Left;  . Esophagogastroduodenoscopy N/A 02/02/2013    Procedure: ESOPHAGOGASTRODUODENOSCOPY (EGD);  Surgeon: Irene Shipper, MD;  Location: [facility] ENDOSCOPY;  Service: Endoscopy;  Laterality: N/A;  . Vaginal hysterectomy    . Colonoscopy    . Insertion of ahmed valve Right 08/20/2013    Procedure: INSERTION OF AHMED VALVE WITH Mitomycin C application;  Surgeon: Marylynn Pearson, MD;  Location: [place];  Service: Ophthalmology;  Laterality: Right;  . Insertion of ahmed valve Left 07/22/2014    Procedure: INSERTION OF AHMED VALVE WITH [place];  Surgeon: Marylynn Pearson, MD;  Location: [place];  Service: Ophthalmology;  Laterality: Left;  [name] Mitomycin c application  Left 07/22/2014    Procedure: MITOMYCIN C APPLICATION;  Surgeon: Marylynn Pearson, MD;  Location: [place];  Service: Ophthalmology;  Laterality: Left;  . Cardiac catheterization N/A 10/27/2014    Procedure: Left Heart Cath and Coronary Angiography;  Surgeon: Leonie Man, MD;  Location: [place] CV LAB;  Service: Cardiovascular;  Laterality: N/A;  . Cardiac catheterization N/A 10/28/2014    Procedure: Coronary Stent Intervention;  Surgeon: Peter M Martinique, MD;  Location: [place] CV LAB;  Service: Cardiovascular;  Laterality: N/A;     Current Outpatient Prescriptions  Medication Sig Dispense Refill  . acetaminophen (TYLENOL) 500 MG tablet Take 500 mg by mouth every 6 (six) hours as needed (pain).    [name] amiodarone (PACERONE) 200 MG tablet Take 0.5 tablets (100 mg total) by mouth daily. 45 tablet 2  . aspirin 81 MG tablet Take 1 tablet (81 mg total) by mouth daily. 30 tablet 3  . calcium acetate (PHOSLO) 667 MG capsule Take 1,334-2,001 mg by mouth See admin instructions. Take 2001mg  three times  daily with meals and Take 1334mg   with snacks.    . dorzolamide-timolol (COSOPT) 22.3-6.8 MG/ML ophthalmic solution Place 1 drop into the left eye 4 (four) times daily.  12  . Fluticasone-Salmeterol (ADVAIR) 250-50 MCG/DOSE AEPB Inhale 1 puff into the lungs daily as needed (shortness of breath).     [name] glucose blood (ONE TOUCH ULTRA TEST) test strip Check blood sugar 2 time per day. DX code E11.9 200 each 2  . metoprolol (LOPRESSOR) 50 MG tablet Take 1 tablet (50 mg total) by mouth 4 (four) times a week. Take 50mg  once a day on Tuesday, Thursday, Saturday, and Sunday. Skipping Dialysis days. 60 tablet 0  . nitroGLYCERIN (NITROSTAT) 0.4 MG SL tablet Place 1 tablet (0.4 mg total) under the tongue every 5 (five) minutes as needed for chest pain. 30 tablet 3  . prednisoLONE acetate (PRED FORTE) 1 % ophthalmic suspension Place 1 drop into the left eye 4 (four) times daily.    . repaglinide (PRANDIN) 0.5 MG tablet Take 1 tablet (0.5 mg total) by mouth 3 (three) times daily before meals. 90 tablet 11  . simvastatin (ZOCOR) 10 MG tablet Take 1 tablet (10 mg total) by mouth every evening. 90 tablet 3  . ticagrelor (BRILINTA) 90 MG TABS tablet Take 1 tablet (90 mg total) by mouth 2 (two) times daily. 60 tablet 0   No current facility-administered medications for this visit.   Facility-Administered Medications Ordered in Other Visits  Medication Dose Route Frequency Provider Last Rate Last Dose  . mitoMYcin (MUTAMYCIN) Injection Use in OR only (0.4 mg/ml)  0.5 mL Left Eye Once Marylynn Pearson, MD        Allergies:   Ace inhibitors; Eggs or egg-derived products; Enalapril; Lisinopril; and Omnipaque    Social History:   Social History   Social History  . Marital Status: Single    Spouse Name: N/A  . Number of Children: N/A  . Years of Education: N/A   Social History Main Topics  . Smoking status: Never Smoker   . Smokeless tobacco: Never Used  . Alcohol Use: No  . Drug Use: No  . Sexual Activity:  No   Other Topics Concern  . None   Social History Narrative    Family History:   Family History  Problem Relation Age of Onset  . Cancer Sister     colon  . Hypertension Other   . Hypertension Mother   .  Coronary artery disease Mother   . Hypertension Father   . Diabetes Father      ROS:   Please see the history of present illness.   Review of Systems  All other systems reviewed and are negative.     PHYSICAL EXAM: VS:  BP 110/50 mmHg  Pulse 100  Ht 5' (1.524 m)  Wt 193 lb (87.544 kg)  BMI 37.69 kg/m2    Wt Readings from Last 3 Encounters:  11/10/14 193 lb (87.544 kg)  10/29/14 193 lb 12.6 oz (87.9 kg)  07/22/14 205 lb 0.4 oz (93 kg)   GEN: Well nourished, well developed, in no acute distress HEENT: normal Neck: no JVD,   no masses Cardiac:  Normal K9/X8, RRR; 2/6 systolic murmur LSB,  no rubs or gallops, no edema; R groin without hematoma or bruit  Respiratory:  clear to auscultation bilaterally, no wheezing, rhonchi or rales. GI: soft, nontender, nondistended, + BS MS: no deformity or atrophy Skin: warm and dry  Neuro:  CNs II-XII intact, Strength and sensation are intact Psych: Normal affect   EKG:  EKG is ordered today.  It demonstrates:   NSR, HR 100, LBBB   Recent Labs: 10/23/2014: B Natriuretic Peptide 1300.0* 10/24/2014: TSH 0.830 10/29/2014: BUN 32*; Creatinine, Ser 5.16*; Hemoglobin 10.7*; Platelets 330; Potassium 4.6; Sodium 133*    Lipid Panel    Component Value Date/Time   CHOL 166 10/25/2014 0545   TRIG 205* 10/25/2014 0545   HDL 27* 10/25/2014 0545   CHOLHDL 6.1 10/25/2014 0545   VLDL 41* 10/25/2014 0545   LDLCALC 98 10/25/2014 0545      ASSESSMENT AND PLAN:  1. CAD:  Recent admission with NSTEMI. LHC demonstrated diffuse CAD and she underwent PCI of the D2 with a DES.  She had unsuccessful PCI of the RCA. She could undergo rotational atherectomy of the RCA if she has refractory angina. She never had chest pain. She has noted  DOE since DC.  This seems to be getting better.  This may be related to the [place].  Of note, I reviewed with our PharmD.  Brilinta is not dialyzable. So, she is ok to continue taking this bid every day.  Given increased risk of bleeding in this patient on dialysis, it may be worthwhile to change her Brilinta to Plavix at some point (? 90 days post PCI) >> will leave this up to Dr. Virl Axe.  Continue ASA, statin, Brilinta, beta-blocker.    2. Ischemic CM:  EF 35-40% by Echo post MI.  Volume management per dialysis.  Will repeat Echo in 90 days post PCI.  BP too soft to start angiotensin receptor blocker.  She can only take beta-blocker on non-dialysis days due to low BP.  3. Hyperlipidemia: continue statin.  4. Mitral Regurgitation:  Mod by recent echo.  5. ESRD:  She goes to dialysis MWF.  6. Atrial Tachycardia:  She remains on Amiodarone.     Medication Changes: Current medicines are reviewed at length with the patient today.  Concerns regarding medicines are as outlined above.  The following changes have been made:   Discontinued Medications   No medications on file   Modified Medications   Modified Medication Previous Medication   SIMVASTATIN (ZOCOR) 10 MG TABLET simvastatin (ZOCOR) 10 MG tablet      Take 1 tablet (10 mg total) by mouth every evening.    Take 1 tablet (10 mg total) by mouth every evening.   New Prescriptions   No  medications on file   Labs/ tests ordered today include:   Orders Placed This Encounter  Procedures  . EKG 12-Lead  . Echocardiogram     Disposition:    FU with Dr. Virl Axe 12/01/14 as planned.     Signed, Versie Starks, MHS 11/10/2014 1:41 PM    [facility] [place], [place], [place]  [address] Phone: [phone]; Fax: [phone]

## 2014-11-09 NOTE — Telephone Encounter (Signed)
This patient is failing BB guidelines and is currently taking Metoprolol 50mg  4 times a week, T-Th- Sat - Sunday  Please advise if one of the guideline BBs is not appropriate at this time (carvedilol, carvedilol CR, Bisprolol or Metoprolol ER) Apt for 11/1.   Thank you [place]; Velora Heckler [facility]

## 2014-11-10 ENCOUNTER — Ambulatory Visit (INDEPENDENT_AMBULATORY_CARE_PROVIDER_SITE_OTHER): Payer: Medicare Other | Admitting: Physician Assistant

## 2014-11-10 ENCOUNTER — Encounter: Payer: Self-pay | Admitting: Physician Assistant

## 2014-11-10 VITALS — BP 110/50 | HR 100 | Ht 60.0 in | Wt 193.0 lb

## 2014-11-10 DIAGNOSIS — E1122 Type 2 diabetes mellitus with diabetic chronic kidney disease: Secondary | ICD-10-CM | POA: Diagnosis not present

## 2014-11-10 DIAGNOSIS — I252 Old myocardial infarction: Secondary | ICD-10-CM

## 2014-11-10 DIAGNOSIS — Z992 Dependence on renal dialysis: Secondary | ICD-10-CM

## 2014-11-10 DIAGNOSIS — I255 Ischemic cardiomyopathy: Secondary | ICD-10-CM

## 2014-11-10 DIAGNOSIS — I132 Hypertensive heart and chronic kidney disease with heart failure and with stage 5 chronic kidney disease, or end stage renal disease: Secondary | ICD-10-CM | POA: Diagnosis not present

## 2014-11-10 DIAGNOSIS — E785 Hyperlipidemia, unspecified: Secondary | ICD-10-CM

## 2014-11-10 DIAGNOSIS — Z48812 Encounter for surgical aftercare following surgery on the circulatory system: Secondary | ICD-10-CM | POA: Diagnosis not present

## 2014-11-10 DIAGNOSIS — R2689 Other abnormalities of gait and mobility: Secondary | ICD-10-CM | POA: Diagnosis not present

## 2014-11-10 DIAGNOSIS — I2510 Atherosclerotic heart disease of native coronary artery without angina pectoris: Secondary | ICD-10-CM

## 2014-11-10 DIAGNOSIS — I340 Nonrheumatic mitral (valve) insufficiency: Secondary | ICD-10-CM

## 2014-11-10 DIAGNOSIS — I471 Supraventricular tachycardia: Secondary | ICD-10-CM

## 2014-11-10 DIAGNOSIS — I5032 Chronic diastolic (congestive) heart failure: Secondary | ICD-10-CM | POA: Diagnosis not present

## 2014-11-10 DIAGNOSIS — N186 End stage renal disease: Secondary | ICD-10-CM

## 2014-11-10 DIAGNOSIS — I214 Non-ST elevation (NSTEMI) myocardial infarction: Secondary | ICD-10-CM | POA: Diagnosis not present

## 2014-11-10 MED ORDER — METOPROLOL TARTRATE 50 MG PO TABS: 50.0000 mg | ORAL_TABLET | Freq: Two times a day (BID) | ORAL | Status: DC

## 2014-11-10 MED ORDER — SIMVASTATIN 10 MG PO TABS: 10.0000 mg | ORAL_TABLET | Freq: Every evening | ORAL | Status: DC

## 2014-11-10 NOTE — Addendum Note (Signed)
Addended byKathlen Mody, Nicki Reaper T on: 11/10/2014 04:52 PM   Modules accepted: Orders

## 2014-11-10 NOTE — Telephone Encounter (Signed)
She cannot tolerate beta-blocker therapy on dialysis days (Mon, Wed, Fri) due to significant hypotension during dialysis. Therefore, she can only take Metoprolol on non-dialysis days. Thank you, Richardson Dopp, PA-C   11/10/2014 4:49 PM

## 2014-11-10 NOTE — Patient Instructions (Signed)
Medication Instructions:  A REFILL FOR SIMVASTATIN HAS BEEN SENT IN TODAY  Labwork: NONE  Testing/Procedures: Your physician has requested that you have an echocardiogram THIS IS TO BE DONE AFTER 01/28/15. Echocardiography is a painless test that uses sound waves to create images of your heart. It provides your doctor with information about the size and shape of your heart and how well your heart's chambers and valves are working. This procedure takes approximately one hour. There are no restrictions for this procedure.   Follow-Up: KEEP YOUR APPT WITH DR. Caryl Comes 12/01/14  Any Other Special Instructions Will Be Listed Below (If Applicable).     If you need a refill on your cardiac medications before your next appointment, please call your pharmacy.

## 2014-11-11 DIAGNOSIS — N186 End stage renal disease: Secondary | ICD-10-CM | POA: Diagnosis not present

## 2014-11-11 DIAGNOSIS — D631 Anemia in chronic kidney disease: Secondary | ICD-10-CM | POA: Diagnosis not present

## 2014-11-11 DIAGNOSIS — N2581 Secondary hyperparathyroidism of renal origin: Secondary | ICD-10-CM | POA: Diagnosis not present

## 2014-11-11 DIAGNOSIS — E119 Type 2 diabetes mellitus without complications: Secondary | ICD-10-CM | POA: Diagnosis not present

## 2014-11-11 DIAGNOSIS — D509 Iron deficiency anemia, unspecified: Secondary | ICD-10-CM | POA: Diagnosis not present

## 2014-11-12 DIAGNOSIS — R2689 Other abnormalities of gait and mobility: Secondary | ICD-10-CM | POA: Diagnosis not present

## 2014-11-12 DIAGNOSIS — Z48812 Encounter for surgical aftercare following surgery on the circulatory system: Secondary | ICD-10-CM | POA: Diagnosis not present

## 2014-11-12 DIAGNOSIS — I5032 Chronic diastolic (congestive) heart failure: Secondary | ICD-10-CM | POA: Diagnosis not present

## 2014-11-12 DIAGNOSIS — E1122 Type 2 diabetes mellitus with diabetic chronic kidney disease: Secondary | ICD-10-CM | POA: Diagnosis not present

## 2014-11-12 DIAGNOSIS — I214 Non-ST elevation (NSTEMI) myocardial infarction: Secondary | ICD-10-CM | POA: Diagnosis not present

## 2014-11-12 DIAGNOSIS — I132 Hypertensive heart and chronic kidney disease with heart failure and with stage 5 chronic kidney disease, or end stage renal disease: Secondary | ICD-10-CM | POA: Diagnosis not present

## 2014-11-13 DIAGNOSIS — D509 Iron deficiency anemia, unspecified: Secondary | ICD-10-CM | POA: Diagnosis not present

## 2014-11-13 DIAGNOSIS — E119 Type 2 diabetes mellitus without complications: Secondary | ICD-10-CM | POA: Diagnosis not present

## 2014-11-13 DIAGNOSIS — D631 Anemia in chronic kidney disease: Secondary | ICD-10-CM | POA: Diagnosis not present

## 2014-11-13 DIAGNOSIS — N186 End stage renal disease: Secondary | ICD-10-CM | POA: Diagnosis not present

## 2014-11-13 DIAGNOSIS — N2581 Secondary hyperparathyroidism of renal origin: Secondary | ICD-10-CM | POA: Diagnosis not present

## 2014-11-16 DIAGNOSIS — D631 Anemia in chronic kidney disease: Secondary | ICD-10-CM | POA: Diagnosis not present

## 2014-11-16 DIAGNOSIS — E119 Type 2 diabetes mellitus without complications: Secondary | ICD-10-CM | POA: Diagnosis not present

## 2014-11-16 DIAGNOSIS — D509 Iron deficiency anemia, unspecified: Secondary | ICD-10-CM | POA: Diagnosis not present

## 2014-11-16 DIAGNOSIS — N186 End stage renal disease: Secondary | ICD-10-CM | POA: Diagnosis not present

## 2014-11-16 DIAGNOSIS — N2581 Secondary hyperparathyroidism of renal origin: Secondary | ICD-10-CM | POA: Diagnosis not present

## 2014-11-17 DIAGNOSIS — Z48812 Encounter for surgical aftercare following surgery on the circulatory system: Secondary | ICD-10-CM | POA: Diagnosis not present

## 2014-11-17 DIAGNOSIS — E1122 Type 2 diabetes mellitus with diabetic chronic kidney disease: Secondary | ICD-10-CM | POA: Diagnosis not present

## 2014-11-17 DIAGNOSIS — R2689 Other abnormalities of gait and mobility: Secondary | ICD-10-CM | POA: Diagnosis not present

## 2014-11-17 DIAGNOSIS — I214 Non-ST elevation (NSTEMI) myocardial infarction: Secondary | ICD-10-CM | POA: Diagnosis not present

## 2014-11-17 DIAGNOSIS — I132 Hypertensive heart and chronic kidney disease with heart failure and with stage 5 chronic kidney disease, or end stage renal disease: Secondary | ICD-10-CM | POA: Diagnosis not present

## 2014-11-17 DIAGNOSIS — I5032 Chronic diastolic (congestive) heart failure: Secondary | ICD-10-CM | POA: Diagnosis not present

## 2014-11-18 DIAGNOSIS — N186 End stage renal disease: Secondary | ICD-10-CM | POA: Diagnosis not present

## 2014-11-18 DIAGNOSIS — D631 Anemia in chronic kidney disease: Secondary | ICD-10-CM | POA: Diagnosis not present

## 2014-11-18 DIAGNOSIS — N2581 Secondary hyperparathyroidism of renal origin: Secondary | ICD-10-CM | POA: Diagnosis not present

## 2014-11-18 DIAGNOSIS — E119 Type 2 diabetes mellitus without complications: Secondary | ICD-10-CM | POA: Diagnosis not present

## 2014-11-18 DIAGNOSIS — D509 Iron deficiency anemia, unspecified: Secondary | ICD-10-CM | POA: Diagnosis not present

## 2014-11-19 DIAGNOSIS — I132 Hypertensive heart and chronic kidney disease with heart failure and with stage 5 chronic kidney disease, or end stage renal disease: Secondary | ICD-10-CM | POA: Diagnosis not present

## 2014-11-19 DIAGNOSIS — Z48812 Encounter for surgical aftercare following surgery on the circulatory system: Secondary | ICD-10-CM | POA: Diagnosis not present

## 2014-11-19 DIAGNOSIS — R2689 Other abnormalities of gait and mobility: Secondary | ICD-10-CM | POA: Diagnosis not present

## 2014-11-19 DIAGNOSIS — I5032 Chronic diastolic (congestive) heart failure: Secondary | ICD-10-CM | POA: Diagnosis not present

## 2014-11-19 DIAGNOSIS — E1122 Type 2 diabetes mellitus with diabetic chronic kidney disease: Secondary | ICD-10-CM | POA: Diagnosis not present

## 2014-11-19 DIAGNOSIS — I214 Non-ST elevation (NSTEMI) myocardial infarction: Secondary | ICD-10-CM | POA: Diagnosis not present

## 2014-11-20 DIAGNOSIS — D631 Anemia in chronic kidney disease: Secondary | ICD-10-CM | POA: Diagnosis not present

## 2014-11-20 DIAGNOSIS — N2581 Secondary hyperparathyroidism of renal origin: Secondary | ICD-10-CM | POA: Diagnosis not present

## 2014-11-20 DIAGNOSIS — N186 End stage renal disease: Secondary | ICD-10-CM | POA: Diagnosis not present

## 2014-11-20 DIAGNOSIS — D509 Iron deficiency anemia, unspecified: Secondary | ICD-10-CM | POA: Diagnosis not present

## 2014-11-20 DIAGNOSIS — E119 Type 2 diabetes mellitus without complications: Secondary | ICD-10-CM | POA: Diagnosis not present

## 2014-11-23 DIAGNOSIS — D509 Iron deficiency anemia, unspecified: Secondary | ICD-10-CM | POA: Diagnosis not present

## 2014-11-23 DIAGNOSIS — D631 Anemia in chronic kidney disease: Secondary | ICD-10-CM | POA: Diagnosis not present

## 2014-11-23 DIAGNOSIS — E119 Type 2 diabetes mellitus without complications: Secondary | ICD-10-CM | POA: Diagnosis not present

## 2014-11-23 DIAGNOSIS — N186 End stage renal disease: Secondary | ICD-10-CM | POA: Diagnosis not present

## 2014-11-23 DIAGNOSIS — N2581 Secondary hyperparathyroidism of renal origin: Secondary | ICD-10-CM | POA: Diagnosis not present

## 2014-11-24 DIAGNOSIS — Z48812 Encounter for surgical aftercare following surgery on the circulatory system: Secondary | ICD-10-CM | POA: Diagnosis not present

## 2014-11-24 DIAGNOSIS — R2689 Other abnormalities of gait and mobility: Secondary | ICD-10-CM | POA: Diagnosis not present

## 2014-11-24 DIAGNOSIS — E1122 Type 2 diabetes mellitus with diabetic chronic kidney disease: Secondary | ICD-10-CM | POA: Diagnosis not present

## 2014-11-24 DIAGNOSIS — I5032 Chronic diastolic (congestive) heart failure: Secondary | ICD-10-CM | POA: Diagnosis not present

## 2014-11-24 DIAGNOSIS — I132 Hypertensive heart and chronic kidney disease with heart failure and with stage 5 chronic kidney disease, or end stage renal disease: Secondary | ICD-10-CM | POA: Diagnosis not present

## 2014-11-24 DIAGNOSIS — I214 Non-ST elevation (NSTEMI) myocardial infarction: Secondary | ICD-10-CM | POA: Diagnosis not present

## 2014-11-25 DIAGNOSIS — D631 Anemia in chronic kidney disease: Secondary | ICD-10-CM | POA: Diagnosis not present

## 2014-11-25 DIAGNOSIS — E119 Type 2 diabetes mellitus without complications: Secondary | ICD-10-CM | POA: Diagnosis not present

## 2014-11-25 DIAGNOSIS — D509 Iron deficiency anemia, unspecified: Secondary | ICD-10-CM | POA: Diagnosis not present

## 2014-11-25 DIAGNOSIS — N2581 Secondary hyperparathyroidism of renal origin: Secondary | ICD-10-CM | POA: Diagnosis not present

## 2014-11-25 DIAGNOSIS — N186 End stage renal disease: Secondary | ICD-10-CM | POA: Diagnosis not present

## 2014-11-26 DIAGNOSIS — Z48812 Encounter for surgical aftercare following surgery on the circulatory system: Secondary | ICD-10-CM | POA: Diagnosis not present

## 2014-11-26 DIAGNOSIS — I132 Hypertensive heart and chronic kidney disease with heart failure and with stage 5 chronic kidney disease, or end stage renal disease: Secondary | ICD-10-CM | POA: Diagnosis not present

## 2014-11-26 DIAGNOSIS — E1122 Type 2 diabetes mellitus with diabetic chronic kidney disease: Secondary | ICD-10-CM | POA: Diagnosis not present

## 2014-11-26 DIAGNOSIS — I5032 Chronic diastolic (congestive) heart failure: Secondary | ICD-10-CM | POA: Diagnosis not present

## 2014-11-26 DIAGNOSIS — I214 Non-ST elevation (NSTEMI) myocardial infarction: Secondary | ICD-10-CM | POA: Diagnosis not present

## 2014-11-26 DIAGNOSIS — R2689 Other abnormalities of gait and mobility: Secondary | ICD-10-CM | POA: Diagnosis not present

## 2014-11-27 DIAGNOSIS — N186 End stage renal disease: Secondary | ICD-10-CM | POA: Diagnosis not present

## 2014-11-27 DIAGNOSIS — N2581 Secondary hyperparathyroidism of renal origin: Secondary | ICD-10-CM | POA: Diagnosis not present

## 2014-11-27 DIAGNOSIS — E119 Type 2 diabetes mellitus without complications: Secondary | ICD-10-CM | POA: Diagnosis not present

## 2014-11-27 DIAGNOSIS — D631 Anemia in chronic kidney disease: Secondary | ICD-10-CM | POA: Diagnosis not present

## 2014-11-27 DIAGNOSIS — D509 Iron deficiency anemia, unspecified: Secondary | ICD-10-CM | POA: Diagnosis not present

## 2014-11-30 DIAGNOSIS — D631 Anemia in chronic kidney disease: Secondary | ICD-10-CM | POA: Diagnosis not present

## 2014-11-30 DIAGNOSIS — E119 Type 2 diabetes mellitus without complications: Secondary | ICD-10-CM | POA: Diagnosis not present

## 2014-11-30 DIAGNOSIS — N186 End stage renal disease: Secondary | ICD-10-CM | POA: Diagnosis not present

## 2014-11-30 DIAGNOSIS — D509 Iron deficiency anemia, unspecified: Secondary | ICD-10-CM | POA: Diagnosis not present

## 2014-11-30 DIAGNOSIS — N2581 Secondary hyperparathyroidism of renal origin: Secondary | ICD-10-CM | POA: Diagnosis not present

## 2014-12-01 ENCOUNTER — Encounter: Payer: Self-pay | Admitting: Internal Medicine

## 2014-12-01 ENCOUNTER — Ambulatory Visit (INDEPENDENT_AMBULATORY_CARE_PROVIDER_SITE_OTHER): Payer: Medicare Other | Admitting: Internal Medicine

## 2014-12-01 VITALS — BP 164/82 | HR 92 | Ht 60.0 in | Wt 189.6 lb

## 2014-12-01 DIAGNOSIS — I255 Ischemic cardiomyopathy: Secondary | ICD-10-CM | POA: Diagnosis not present

## 2014-12-01 DIAGNOSIS — I471 Supraventricular tachycardia: Secondary | ICD-10-CM

## 2014-12-01 NOTE — Patient Instructions (Signed)
Medication Instructions: - no changes  Labwork: - none  Procedures/Testing: - none  Follow-Up: - Your physician wants you to follow-up in: 6 months with Chanetta Marshall, NP for Dr. Caryl Comes & 1 year with Dr. Caryl Comes. You will receive a reminder letter in the mail two months in advance. If you don't receive a letter, please call our office to schedule the follow-up appointment.   Any Additional Special Instructions Will Be Listed Below (If Applicable). - none

## 2014-12-01 NOTE — Progress Notes (Signed)
.      Patient Care Team: Cassandria Anger, MD as PCP - General Corliss Parish, MD (Nephrology) Deboraha Sprang, MD (Cardiology) Renato Shin, MD as Attending Physician (Internal Medicine) Gatha Mayer, MD as Consulting Physician (Gastroenterology)   HPI  [name] is a 69 y.o. female Seen in followup for atrial tachycardia treated and responsive to amiodarone.  She has end-stage renal disease and is on dialysis.  Remotely she had an echo that demonstrated severe left ventricular dysfunction at about 25%. She developed recurrent tachycardia in December 2010 with a non-STEMI.  Ejection fraction by echocardiogram 1/15 was 55-60%Ejection fraction by echocardiogram 1/15 was 55-60%  She had a non-STEMI again 10/16 and underwent catheterization demonstrating severe three-vessel disease. She was noted to have had a change in her ejection fraction from 55--30%   She underwent incomplete revascularization with PCI  She has chronic left bundle branch block  She has no complaints of chest pain   She her breathing is back at baseline   TSH was normal 10/16     Past Medical History  Diagnosis Date  . Hyperlipidemia   . Mitral valve insufficiency and aortic valve insufficiency   . Cardiomyopathy- mixed   . 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 Chronic diastolic heart failure ([place])   . Anemia   . Coronary artery disease     Previously decreased EF; echo 113 normal LV function  . Hernia, incisional     abd  . History of colon cancer     history of colon cancer.  1986  . Arthritis   . LBBB (left bundle branch block)   . Atrial tachycardia ([place])     on amiod  . Hypertensive heart disease     sees Dr. Alain Marion  . Stroke [facility])     2011/12  . Obesity   . Hypertension   . Glaucoma   . Renal failure     ESRD, Dr Dunham/Dr. Lyda Kalata.  M, W, Fr  . GERD (gastroesophageal reflux disease)   . Cancer [facility])     colon  cancer  . Colostomy in place [facility])     Past Surgical History  Procedure Laterality Date  . Colostomy  1986  . Revision urostomy cutaneous    . Esophagogastroduodenoscopy  03-18-04  . Electrocardiogram  04-27-06  . Placement of new left forearm arteriovenous graft  03-11-08  . Left heart catheterization and right heart catheterization  12-10    R. heart cath showed elevated left and right heart filling pressures w/ pulmonary artery pressure elevated mildly out of proportion to the wedge. The left heart cath showed diffuse distal vessel disease as well as a 75% stenosis in the mid circumflex w/ a 90% stenosis of the ostial first obtuse marginal. These lesions were in close proximity. there was a 60-70%mild RCA stenosis.   . Arteriovenous graft placement  2010  . 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 Cardiac catheterization    . Eye surgery      Cataract Left  . Pars plana vitrectomy  11/29/2011    Procedure: PARS PLANA VITRECTOMY WITH 23 GAUGE;  Surgeon: Adonis Brook, MD;  Location: [place];  Service: Ophthalmology;  Laterality: Right;  Right Eye 23 ga vitrectomy with membrane peel  . Pars plana vitrectomy Left 02/28/2012    Procedure: PARS PLANA VITRECTOMY WITH 23 GAUGE;  Surgeon: Adonis Brook,  MD;  Location: [place];  Service: Ophthalmology;  Laterality: Left;  . Esophagogastroduodenoscopy N/A 02/02/2013    Procedure: ESOPHAGOGASTRODUODENOSCOPY (EGD);  Surgeon: Irene Shipper, MD;  Location: [facility] ENDOSCOPY;  Service: Endoscopy;  Laterality: N/A;  . Vaginal hysterectomy    . Colonoscopy    . Insertion of ahmed valve Right 08/20/2013    Procedure: INSERTION OF AHMED VALVE WITH Mitomycin C application;  Surgeon: Marylynn Pearson, MD;  Location: [place];  Service: Ophthalmology;  Laterality: Right;  . Insertion of ahmed valve Left 07/22/2014    Procedure: INSERTION OF AHMED VALVE WITH [place];  Surgeon: Marylynn Pearson, MD;  Location: [place];  Service: Ophthalmology;   Laterality: Left;  [name] Mitomycin c application Left [id number]    Procedure: MITOMYCIN C APPLICATION;  Surgeon: Marylynn Pearson, MD;  Location: [place];  Service: Ophthalmology;  Laterality: Left;  . Cardiac catheterization N/A 10/27/2014    Procedure: Left Heart Cath and Coronary Angiography;  Surgeon: Leonie Man, MD;  Location: [place] CV LAB;  Service: Cardiovascular;  Laterality: N/A;  . Cardiac catheterization N/A 10/28/2014    Procedure: Coronary Stent Intervention;  Surgeon: Peter M Martinique, MD;  Location: [place] CV LAB;  Service: Cardiovascular;  Laterality: N/A;    Current Outpatient Prescriptions  Medication Sig Dispense Refill  . acetaminophen (TYLENOL) 500 MG tablet Take 500 mg by mouth every 6 (six) hours as needed (pain).    [name] amiodarone (PACERONE) 200 MG tablet Take 0.5 tablets (100 mg total) by mouth daily. 45 tablet 2  . aspirin 81 MG tablet Take 1 tablet (81 mg total) by mouth daily. 30 tablet 3  . calcium acetate (PHOSLO) 667 MG capsule Take 1,334-2,001 mg by mouth See admin instructions. Take 2001mg  three times daily with meals and Take 1334mg   with snacks.    . dorzolamide-timolol (COSOPT) 22.3-6.8 MG/ML ophthalmic solution Place 1 drop into the left eye 4 (four) times daily.  12  . Fluticasone-Salmeterol (ADVAIR) 250-50 MCG/DOSE AEPB Inhale 1 puff into the lungs daily as needed (shortness of breath).     [name] glucose blood (ONE TOUCH ULTRA TEST) test strip Check blood sugar 2 time per day. DX code E11.9 200 each 2  . metoprolol (LOPRESSOR) 50 MG tablet Take 1 tablet (50 mg total) by mouth 2 (two) times daily. Do not take this medication on dialysis days (Monday, Wednesday, Friday). 60 tablet 0  . nitroGLYCERIN (NITROSTAT) 0.4 MG SL tablet Place 1 tablet (0.4 mg total) under the tongue every 5 (five) minutes as needed for chest pain. 30 tablet 3  . prednisoLONE acetate (PRED FORTE) 1 % ophthalmic suspension Place 1 drop into the left eye 4 (four) times daily.    .  repaglinide (PRANDIN) 0.5 MG tablet Take 1 tablet (0.5 mg total) by mouth 3 (three) times daily before meals. 90 tablet 11  . simvastatin (ZOCOR) 10 MG tablet Take 1 tablet (10 mg total) by mouth every evening. 90 tablet 3  . ticagrelor (BRILINTA) 90 MG TABS tablet Take 1 tablet (90 mg total) by mouth 2 (two) times daily. 60 tablet 0   No current facility-administered medications for this visit.   Facility-Administered Medications Ordered in Other Visits  Medication Dose Route Frequency Provider Last Rate Last Dose  . mitoMYcin (MUTAMYCIN) Injection Use in OR only (0.4 mg/ml)  0.5 mL Left Eye Once Marylynn Pearson, MD        Allergies  Allergen Reactions  . Ace Inhibitors Cough  . Eggs Or Egg-Derived  Products Nausea And Vomiting  . Enalapril Cough  . Lisinopril Cough  . Omnipaque [Iohexol] Hives    Review of Systems negative except from HPI and PMH  Physical Exam BP 164/82 mmHg  Pulse 92  Ht 5' (1.524 m)  Wt 189 lb 9.6 oz (86.002 kg)  BMI 37.03 kg/m2 Well developed and well nourished in no acute distress HENT normal E scleral and icterus clear Neck Supple JVP flat; carotids brisk and full Clear to ausculation  Regular rate and rhythm,  2/6 systolic murmur with a split S2 Soft with active bowel sounds No clubbing cyanosis  Edema Alert and oriented, grossly normal motor and sensory function Skin Warm and Dry   ECG demonstrates sinus rhythm at 90 Intervals 30/16/44   Assessment and  Plan  Atrial tachycardia   Amiodarone therapy-chronic   First-degree AV block-progressive  Aortic stenosis-mild   Mitral stenosis??   CAD 3V    The patient will be maintained on dual antiplatelet therapy for a year.  We will see her again in 6 months time for amiodarone surveillance.    BP is followed by nephrology

## 2014-12-02 DIAGNOSIS — E119 Type 2 diabetes mellitus without complications: Secondary | ICD-10-CM | POA: Diagnosis not present

## 2014-12-02 DIAGNOSIS — D509 Iron deficiency anemia, unspecified: Secondary | ICD-10-CM | POA: Diagnosis not present

## 2014-12-02 DIAGNOSIS — N2581 Secondary hyperparathyroidism of renal origin: Secondary | ICD-10-CM | POA: Diagnosis not present

## 2014-12-02 DIAGNOSIS — D631 Anemia in chronic kidney disease: Secondary | ICD-10-CM | POA: Diagnosis not present

## 2014-12-02 DIAGNOSIS — N186 End stage renal disease: Secondary | ICD-10-CM | POA: Diagnosis not present

## 2014-12-05 DIAGNOSIS — E119 Type 2 diabetes mellitus without complications: Secondary | ICD-10-CM | POA: Diagnosis not present

## 2014-12-05 DIAGNOSIS — D509 Iron deficiency anemia, unspecified: Secondary | ICD-10-CM | POA: Diagnosis not present

## 2014-12-05 DIAGNOSIS — D631 Anemia in chronic kidney disease: Secondary | ICD-10-CM | POA: Diagnosis not present

## 2014-12-05 DIAGNOSIS — N186 End stage renal disease: Secondary | ICD-10-CM | POA: Diagnosis not present

## 2014-12-05 DIAGNOSIS — N2581 Secondary hyperparathyroidism of renal origin: Secondary | ICD-10-CM | POA: Diagnosis not present

## 2014-12-07 DIAGNOSIS — D509 Iron deficiency anemia, unspecified: Secondary | ICD-10-CM | POA: Diagnosis not present

## 2014-12-07 DIAGNOSIS — E119 Type 2 diabetes mellitus without complications: Secondary | ICD-10-CM | POA: Diagnosis not present

## 2014-12-07 DIAGNOSIS — N2581 Secondary hyperparathyroidism of renal origin: Secondary | ICD-10-CM | POA: Diagnosis not present

## 2014-12-07 DIAGNOSIS — D631 Anemia in chronic kidney disease: Secondary | ICD-10-CM | POA: Diagnosis not present

## 2014-12-07 DIAGNOSIS — N186 End stage renal disease: Secondary | ICD-10-CM | POA: Diagnosis not present

## 2014-12-09 DIAGNOSIS — E119 Type 2 diabetes mellitus without complications: Secondary | ICD-10-CM | POA: Diagnosis not present

## 2014-12-09 DIAGNOSIS — N2581 Secondary hyperparathyroidism of renal origin: Secondary | ICD-10-CM | POA: Diagnosis not present

## 2014-12-09 DIAGNOSIS — Z992 Dependence on renal dialysis: Secondary | ICD-10-CM | POA: Diagnosis not present

## 2014-12-09 DIAGNOSIS — E1129 Type 2 diabetes mellitus with other diabetic kidney complication: Secondary | ICD-10-CM | POA: Diagnosis not present

## 2014-12-09 DIAGNOSIS — N186 End stage renal disease: Secondary | ICD-10-CM | POA: Diagnosis not present

## 2014-12-09 DIAGNOSIS — D509 Iron deficiency anemia, unspecified: Secondary | ICD-10-CM | POA: Diagnosis not present

## 2014-12-09 DIAGNOSIS — D631 Anemia in chronic kidney disease: Secondary | ICD-10-CM | POA: Diagnosis not present

## 2014-12-11 DIAGNOSIS — D631 Anemia in chronic kidney disease: Secondary | ICD-10-CM | POA: Diagnosis not present

## 2014-12-11 DIAGNOSIS — N186 End stage renal disease: Secondary | ICD-10-CM | POA: Diagnosis not present

## 2014-12-11 DIAGNOSIS — N2581 Secondary hyperparathyroidism of renal origin: Secondary | ICD-10-CM | POA: Diagnosis not present

## 2014-12-11 DIAGNOSIS — E119 Type 2 diabetes mellitus without complications: Secondary | ICD-10-CM | POA: Diagnosis not present

## 2014-12-11 DIAGNOSIS — D509 Iron deficiency anemia, unspecified: Secondary | ICD-10-CM | POA: Diagnosis not present

## 2014-12-14 DIAGNOSIS — N2581 Secondary hyperparathyroidism of renal origin: Secondary | ICD-10-CM | POA: Diagnosis not present

## 2014-12-14 DIAGNOSIS — D509 Iron deficiency anemia, unspecified: Secondary | ICD-10-CM | POA: Diagnosis not present

## 2014-12-14 DIAGNOSIS — E119 Type 2 diabetes mellitus without complications: Secondary | ICD-10-CM | POA: Diagnosis not present

## 2014-12-14 DIAGNOSIS — D631 Anemia in chronic kidney disease: Secondary | ICD-10-CM | POA: Diagnosis not present

## 2014-12-14 DIAGNOSIS — N186 End stage renal disease: Secondary | ICD-10-CM | POA: Diagnosis not present

## 2014-12-16 DIAGNOSIS — D509 Iron deficiency anemia, unspecified: Secondary | ICD-10-CM | POA: Diagnosis not present

## 2014-12-16 DIAGNOSIS — D631 Anemia in chronic kidney disease: Secondary | ICD-10-CM | POA: Diagnosis not present

## 2014-12-16 DIAGNOSIS — N2581 Secondary hyperparathyroidism of renal origin: Secondary | ICD-10-CM | POA: Diagnosis not present

## 2014-12-16 DIAGNOSIS — E119 Type 2 diabetes mellitus without complications: Secondary | ICD-10-CM | POA: Diagnosis not present

## 2014-12-16 DIAGNOSIS — N186 End stage renal disease: Secondary | ICD-10-CM | POA: Diagnosis not present

## 2014-12-17 DIAGNOSIS — I214 Non-ST elevation (NSTEMI) myocardial infarction: Secondary | ICD-10-CM | POA: Diagnosis not present

## 2014-12-17 DIAGNOSIS — E1122 Type 2 diabetes mellitus with diabetic chronic kidney disease: Secondary | ICD-10-CM | POA: Diagnosis not present

## 2014-12-17 DIAGNOSIS — Z48812 Encounter for surgical aftercare following surgery on the circulatory system: Secondary | ICD-10-CM | POA: Diagnosis not present

## 2014-12-17 DIAGNOSIS — R2689 Other abnormalities of gait and mobility: Secondary | ICD-10-CM | POA: Diagnosis not present

## 2014-12-19 DIAGNOSIS — D631 Anemia in chronic kidney disease: Secondary | ICD-10-CM | POA: Diagnosis not present

## 2014-12-19 DIAGNOSIS — D509 Iron deficiency anemia, unspecified: Secondary | ICD-10-CM | POA: Diagnosis not present

## 2014-12-19 DIAGNOSIS — E119 Type 2 diabetes mellitus without complications: Secondary | ICD-10-CM | POA: Diagnosis not present

## 2014-12-19 DIAGNOSIS — N186 End stage renal disease: Secondary | ICD-10-CM | POA: Diagnosis not present

## 2014-12-19 DIAGNOSIS — N2581 Secondary hyperparathyroidism of renal origin: Secondary | ICD-10-CM | POA: Diagnosis not present

## 2014-12-21 DIAGNOSIS — N2581 Secondary hyperparathyroidism of renal origin: Secondary | ICD-10-CM | POA: Diagnosis not present

## 2014-12-21 DIAGNOSIS — E119 Type 2 diabetes mellitus without complications: Secondary | ICD-10-CM | POA: Diagnosis not present

## 2014-12-21 DIAGNOSIS — N186 End stage renal disease: Secondary | ICD-10-CM | POA: Diagnosis not present

## 2014-12-21 DIAGNOSIS — D509 Iron deficiency anemia, unspecified: Secondary | ICD-10-CM | POA: Diagnosis not present

## 2014-12-21 DIAGNOSIS — D631 Anemia in chronic kidney disease: Secondary | ICD-10-CM | POA: Diagnosis not present

## 2014-12-23 DIAGNOSIS — D509 Iron deficiency anemia, unspecified: Secondary | ICD-10-CM | POA: Diagnosis not present

## 2014-12-23 DIAGNOSIS — N186 End stage renal disease: Secondary | ICD-10-CM | POA: Diagnosis not present

## 2014-12-23 DIAGNOSIS — N2581 Secondary hyperparathyroidism of renal origin: Secondary | ICD-10-CM | POA: Diagnosis not present

## 2014-12-23 DIAGNOSIS — E119 Type 2 diabetes mellitus without complications: Secondary | ICD-10-CM | POA: Diagnosis not present

## 2014-12-23 DIAGNOSIS — D631 Anemia in chronic kidney disease: Secondary | ICD-10-CM | POA: Diagnosis not present

## 2014-12-24 DIAGNOSIS — H401133 Primary open-angle glaucoma, bilateral, severe stage: Secondary | ICD-10-CM | POA: Diagnosis not present

## 2014-12-25 DIAGNOSIS — D631 Anemia in chronic kidney disease: Secondary | ICD-10-CM | POA: Diagnosis not present

## 2014-12-25 DIAGNOSIS — D509 Iron deficiency anemia, unspecified: Secondary | ICD-10-CM | POA: Diagnosis not present

## 2014-12-25 DIAGNOSIS — N186 End stage renal disease: Secondary | ICD-10-CM | POA: Diagnosis not present

## 2014-12-25 DIAGNOSIS — N2581 Secondary hyperparathyroidism of renal origin: Secondary | ICD-10-CM | POA: Diagnosis not present

## 2014-12-25 DIAGNOSIS — E119 Type 2 diabetes mellitus without complications: Secondary | ICD-10-CM | POA: Diagnosis not present

## 2014-12-28 DIAGNOSIS — E119 Type 2 diabetes mellitus without complications: Secondary | ICD-10-CM | POA: Diagnosis not present

## 2014-12-28 DIAGNOSIS — N186 End stage renal disease: Secondary | ICD-10-CM | POA: Diagnosis not present

## 2014-12-28 DIAGNOSIS — N2581 Secondary hyperparathyroidism of renal origin: Secondary | ICD-10-CM | POA: Diagnosis not present

## 2014-12-28 DIAGNOSIS — D509 Iron deficiency anemia, unspecified: Secondary | ICD-10-CM | POA: Diagnosis not present

## 2014-12-28 DIAGNOSIS — D631 Anemia in chronic kidney disease: Secondary | ICD-10-CM | POA: Diagnosis not present

## 2014-12-30 DIAGNOSIS — E119 Type 2 diabetes mellitus without complications: Secondary | ICD-10-CM | POA: Diagnosis not present

## 2014-12-30 DIAGNOSIS — D509 Iron deficiency anemia, unspecified: Secondary | ICD-10-CM | POA: Diagnosis not present

## 2014-12-30 DIAGNOSIS — N186 End stage renal disease: Secondary | ICD-10-CM | POA: Diagnosis not present

## 2014-12-30 DIAGNOSIS — N2581 Secondary hyperparathyroidism of renal origin: Secondary | ICD-10-CM | POA: Diagnosis not present

## 2014-12-30 DIAGNOSIS — D631 Anemia in chronic kidney disease: Secondary | ICD-10-CM | POA: Diagnosis not present

## 2015-01-01 DIAGNOSIS — N2581 Secondary hyperparathyroidism of renal origin: Secondary | ICD-10-CM | POA: Diagnosis not present

## 2015-01-01 DIAGNOSIS — N186 End stage renal disease: Secondary | ICD-10-CM | POA: Diagnosis not present

## 2015-01-01 DIAGNOSIS — E119 Type 2 diabetes mellitus without complications: Secondary | ICD-10-CM | POA: Diagnosis not present

## 2015-01-01 DIAGNOSIS — D631 Anemia in chronic kidney disease: Secondary | ICD-10-CM | POA: Diagnosis not present

## 2015-01-01 DIAGNOSIS — D509 Iron deficiency anemia, unspecified: Secondary | ICD-10-CM | POA: Diagnosis not present

## 2015-01-04 DIAGNOSIS — D509 Iron deficiency anemia, unspecified: Secondary | ICD-10-CM | POA: Diagnosis not present

## 2015-01-04 DIAGNOSIS — N2581 Secondary hyperparathyroidism of renal origin: Secondary | ICD-10-CM | POA: Diagnosis not present

## 2015-01-04 DIAGNOSIS — D631 Anemia in chronic kidney disease: Secondary | ICD-10-CM | POA: Diagnosis not present

## 2015-01-04 DIAGNOSIS — E119 Type 2 diabetes mellitus without complications: Secondary | ICD-10-CM | POA: Diagnosis not present

## 2015-01-04 DIAGNOSIS — N186 End stage renal disease: Secondary | ICD-10-CM | POA: Diagnosis not present

## 2015-01-06 ENCOUNTER — Encounter: Payer: Self-pay | Admitting: *Deleted

## 2015-01-06 DIAGNOSIS — D509 Iron deficiency anemia, unspecified: Secondary | ICD-10-CM | POA: Diagnosis not present

## 2015-01-06 DIAGNOSIS — D631 Anemia in chronic kidney disease: Secondary | ICD-10-CM | POA: Diagnosis not present

## 2015-01-06 DIAGNOSIS — E119 Type 2 diabetes mellitus without complications: Secondary | ICD-10-CM | POA: Diagnosis not present

## 2015-01-06 DIAGNOSIS — N2581 Secondary hyperparathyroidism of renal origin: Secondary | ICD-10-CM | POA: Diagnosis not present

## 2015-01-06 DIAGNOSIS — N186 End stage renal disease: Secondary | ICD-10-CM | POA: Diagnosis not present

## 2015-01-08 DIAGNOSIS — E119 Type 2 diabetes mellitus without complications: Secondary | ICD-10-CM | POA: Diagnosis not present

## 2015-01-08 DIAGNOSIS — D509 Iron deficiency anemia, unspecified: Secondary | ICD-10-CM | POA: Diagnosis not present

## 2015-01-08 DIAGNOSIS — N186 End stage renal disease: Secondary | ICD-10-CM | POA: Diagnosis not present

## 2015-01-08 DIAGNOSIS — N2581 Secondary hyperparathyroidism of renal origin: Secondary | ICD-10-CM | POA: Diagnosis not present

## 2015-01-08 DIAGNOSIS — D631 Anemia in chronic kidney disease: Secondary | ICD-10-CM | POA: Diagnosis not present

## 2015-01-09 DIAGNOSIS — E1129 Type 2 diabetes mellitus with other diabetic kidney complication: Secondary | ICD-10-CM | POA: Diagnosis not present

## 2015-01-09 DIAGNOSIS — Z992 Dependence on renal dialysis: Secondary | ICD-10-CM | POA: Diagnosis not present

## 2015-01-09 DIAGNOSIS — N186 End stage renal disease: Secondary | ICD-10-CM | POA: Diagnosis not present

## 2015-01-11 DIAGNOSIS — N186 End stage renal disease: Secondary | ICD-10-CM | POA: Diagnosis not present

## 2015-01-11 DIAGNOSIS — D509 Iron deficiency anemia, unspecified: Secondary | ICD-10-CM | POA: Diagnosis not present

## 2015-01-11 DIAGNOSIS — E119 Type 2 diabetes mellitus without complications: Secondary | ICD-10-CM | POA: Diagnosis not present

## 2015-01-11 DIAGNOSIS — N2581 Secondary hyperparathyroidism of renal origin: Secondary | ICD-10-CM | POA: Diagnosis not present

## 2015-01-11 DIAGNOSIS — E1129 Type 2 diabetes mellitus with other diabetic kidney complication: Secondary | ICD-10-CM | POA: Diagnosis not present

## 2015-01-11 DIAGNOSIS — D631 Anemia in chronic kidney disease: Secondary | ICD-10-CM | POA: Diagnosis not present

## 2015-01-13 DIAGNOSIS — D509 Iron deficiency anemia, unspecified: Secondary | ICD-10-CM | POA: Diagnosis not present

## 2015-01-13 DIAGNOSIS — D631 Anemia in chronic kidney disease: Secondary | ICD-10-CM | POA: Diagnosis not present

## 2015-01-13 DIAGNOSIS — E1129 Type 2 diabetes mellitus with other diabetic kidney complication: Secondary | ICD-10-CM | POA: Diagnosis not present

## 2015-01-13 DIAGNOSIS — E119 Type 2 diabetes mellitus without complications: Secondary | ICD-10-CM | POA: Diagnosis not present

## 2015-01-13 DIAGNOSIS — N2581 Secondary hyperparathyroidism of renal origin: Secondary | ICD-10-CM | POA: Diagnosis not present

## 2015-01-13 DIAGNOSIS — N186 End stage renal disease: Secondary | ICD-10-CM | POA: Diagnosis not present

## 2015-01-15 DIAGNOSIS — D509 Iron deficiency anemia, unspecified: Secondary | ICD-10-CM | POA: Diagnosis not present

## 2015-01-15 DIAGNOSIS — D631 Anemia in chronic kidney disease: Secondary | ICD-10-CM | POA: Diagnosis not present

## 2015-01-15 DIAGNOSIS — N2581 Secondary hyperparathyroidism of renal origin: Secondary | ICD-10-CM | POA: Diagnosis not present

## 2015-01-15 DIAGNOSIS — N186 End stage renal disease: Secondary | ICD-10-CM | POA: Diagnosis not present

## 2015-01-15 DIAGNOSIS — E1129 Type 2 diabetes mellitus with other diabetic kidney complication: Secondary | ICD-10-CM | POA: Diagnosis not present

## 2015-01-15 DIAGNOSIS — E119 Type 2 diabetes mellitus without complications: Secondary | ICD-10-CM | POA: Diagnosis not present

## 2015-01-18 DIAGNOSIS — N186 End stage renal disease: Secondary | ICD-10-CM | POA: Diagnosis not present

## 2015-01-18 DIAGNOSIS — D631 Anemia in chronic kidney disease: Secondary | ICD-10-CM | POA: Diagnosis not present

## 2015-01-18 DIAGNOSIS — E1129 Type 2 diabetes mellitus with other diabetic kidney complication: Secondary | ICD-10-CM | POA: Diagnosis not present

## 2015-01-18 DIAGNOSIS — N2581 Secondary hyperparathyroidism of renal origin: Secondary | ICD-10-CM | POA: Diagnosis not present

## 2015-01-18 DIAGNOSIS — D509 Iron deficiency anemia, unspecified: Secondary | ICD-10-CM | POA: Diagnosis not present

## 2015-01-18 DIAGNOSIS — E119 Type 2 diabetes mellitus without complications: Secondary | ICD-10-CM | POA: Diagnosis not present

## 2015-01-20 DIAGNOSIS — E119 Type 2 diabetes mellitus without complications: Secondary | ICD-10-CM | POA: Diagnosis not present

## 2015-01-20 DIAGNOSIS — D631 Anemia in chronic kidney disease: Secondary | ICD-10-CM | POA: Diagnosis not present

## 2015-01-20 DIAGNOSIS — E1129 Type 2 diabetes mellitus with other diabetic kidney complication: Secondary | ICD-10-CM | POA: Diagnosis not present

## 2015-01-20 DIAGNOSIS — N186 End stage renal disease: Secondary | ICD-10-CM | POA: Diagnosis not present

## 2015-01-20 DIAGNOSIS — N2581 Secondary hyperparathyroidism of renal origin: Secondary | ICD-10-CM | POA: Diagnosis not present

## 2015-01-20 DIAGNOSIS — D509 Iron deficiency anemia, unspecified: Secondary | ICD-10-CM | POA: Diagnosis not present

## 2015-01-22 DIAGNOSIS — D631 Anemia in chronic kidney disease: Secondary | ICD-10-CM | POA: Diagnosis not present

## 2015-01-22 DIAGNOSIS — N186 End stage renal disease: Secondary | ICD-10-CM | POA: Diagnosis not present

## 2015-01-22 DIAGNOSIS — E119 Type 2 diabetes mellitus without complications: Secondary | ICD-10-CM | POA: Diagnosis not present

## 2015-01-22 DIAGNOSIS — D509 Iron deficiency anemia, unspecified: Secondary | ICD-10-CM | POA: Diagnosis not present

## 2015-01-22 DIAGNOSIS — N2581 Secondary hyperparathyroidism of renal origin: Secondary | ICD-10-CM | POA: Diagnosis not present

## 2015-01-22 DIAGNOSIS — E1129 Type 2 diabetes mellitus with other diabetic kidney complication: Secondary | ICD-10-CM | POA: Diagnosis not present

## 2015-01-25 DIAGNOSIS — E1129 Type 2 diabetes mellitus with other diabetic kidney complication: Secondary | ICD-10-CM | POA: Diagnosis not present

## 2015-01-25 DIAGNOSIS — E119 Type 2 diabetes mellitus without complications: Secondary | ICD-10-CM | POA: Diagnosis not present

## 2015-01-25 DIAGNOSIS — N2581 Secondary hyperparathyroidism of renal origin: Secondary | ICD-10-CM | POA: Diagnosis not present

## 2015-01-25 DIAGNOSIS — N186 End stage renal disease: Secondary | ICD-10-CM | POA: Diagnosis not present

## 2015-01-25 DIAGNOSIS — D509 Iron deficiency anemia, unspecified: Secondary | ICD-10-CM | POA: Diagnosis not present

## 2015-01-25 DIAGNOSIS — D631 Anemia in chronic kidney disease: Secondary | ICD-10-CM | POA: Diagnosis not present

## 2015-01-26 DIAGNOSIS — H401133 Primary open-angle glaucoma, bilateral, severe stage: Secondary | ICD-10-CM | POA: Diagnosis not present

## 2015-01-27 DIAGNOSIS — N186 End stage renal disease: Secondary | ICD-10-CM | POA: Diagnosis not present

## 2015-01-27 DIAGNOSIS — D631 Anemia in chronic kidney disease: Secondary | ICD-10-CM | POA: Diagnosis not present

## 2015-01-27 DIAGNOSIS — E119 Type 2 diabetes mellitus without complications: Secondary | ICD-10-CM | POA: Diagnosis not present

## 2015-01-27 DIAGNOSIS — D509 Iron deficiency anemia, unspecified: Secondary | ICD-10-CM | POA: Diagnosis not present

## 2015-01-27 DIAGNOSIS — E1129 Type 2 diabetes mellitus with other diabetic kidney complication: Secondary | ICD-10-CM | POA: Diagnosis not present

## 2015-01-27 DIAGNOSIS — N2581 Secondary hyperparathyroidism of renal origin: Secondary | ICD-10-CM | POA: Diagnosis not present

## 2015-01-29 DIAGNOSIS — N186 End stage renal disease: Secondary | ICD-10-CM | POA: Diagnosis not present

## 2015-01-29 DIAGNOSIS — N2581 Secondary hyperparathyroidism of renal origin: Secondary | ICD-10-CM | POA: Diagnosis not present

## 2015-01-29 DIAGNOSIS — E119 Type 2 diabetes mellitus without complications: Secondary | ICD-10-CM | POA: Diagnosis not present

## 2015-01-29 DIAGNOSIS — E1129 Type 2 diabetes mellitus with other diabetic kidney complication: Secondary | ICD-10-CM | POA: Diagnosis not present

## 2015-01-29 DIAGNOSIS — D631 Anemia in chronic kidney disease: Secondary | ICD-10-CM | POA: Diagnosis not present

## 2015-01-29 DIAGNOSIS — D509 Iron deficiency anemia, unspecified: Secondary | ICD-10-CM | POA: Diagnosis not present

## 2015-02-01 DIAGNOSIS — E1129 Type 2 diabetes mellitus with other diabetic kidney complication: Secondary | ICD-10-CM | POA: Diagnosis not present

## 2015-02-01 DIAGNOSIS — D509 Iron deficiency anemia, unspecified: Secondary | ICD-10-CM | POA: Diagnosis not present

## 2015-02-01 DIAGNOSIS — E119 Type 2 diabetes mellitus without complications: Secondary | ICD-10-CM | POA: Diagnosis not present

## 2015-02-01 DIAGNOSIS — D631 Anemia in chronic kidney disease: Secondary | ICD-10-CM | POA: Diagnosis not present

## 2015-02-01 DIAGNOSIS — N2581 Secondary hyperparathyroidism of renal origin: Secondary | ICD-10-CM | POA: Diagnosis not present

## 2015-02-01 DIAGNOSIS — N186 End stage renal disease: Secondary | ICD-10-CM | POA: Diagnosis not present

## 2015-02-02 ENCOUNTER — Other Ambulatory Visit (HOSPITAL_COMMUNITY): Payer: Medicare Other

## 2015-02-03 DIAGNOSIS — E1129 Type 2 diabetes mellitus with other diabetic kidney complication: Secondary | ICD-10-CM | POA: Diagnosis not present

## 2015-02-03 DIAGNOSIS — N186 End stage renal disease: Secondary | ICD-10-CM | POA: Diagnosis not present

## 2015-02-03 DIAGNOSIS — E119 Type 2 diabetes mellitus without complications: Secondary | ICD-10-CM | POA: Diagnosis not present

## 2015-02-03 DIAGNOSIS — D631 Anemia in chronic kidney disease: Secondary | ICD-10-CM | POA: Diagnosis not present

## 2015-02-03 DIAGNOSIS — D509 Iron deficiency anemia, unspecified: Secondary | ICD-10-CM | POA: Diagnosis not present

## 2015-02-03 DIAGNOSIS — N2581 Secondary hyperparathyroidism of renal origin: Secondary | ICD-10-CM | POA: Diagnosis not present

## 2015-02-05 DIAGNOSIS — E1129 Type 2 diabetes mellitus with other diabetic kidney complication: Secondary | ICD-10-CM | POA: Diagnosis not present

## 2015-02-05 DIAGNOSIS — N186 End stage renal disease: Secondary | ICD-10-CM | POA: Diagnosis not present

## 2015-02-05 DIAGNOSIS — N2581 Secondary hyperparathyroidism of renal origin: Secondary | ICD-10-CM | POA: Diagnosis not present

## 2015-02-05 DIAGNOSIS — D509 Iron deficiency anemia, unspecified: Secondary | ICD-10-CM | POA: Diagnosis not present

## 2015-02-05 DIAGNOSIS — D631 Anemia in chronic kidney disease: Secondary | ICD-10-CM | POA: Diagnosis not present

## 2015-02-05 DIAGNOSIS — E119 Type 2 diabetes mellitus without complications: Secondary | ICD-10-CM | POA: Diagnosis not present

## 2015-02-08 DIAGNOSIS — N2581 Secondary hyperparathyroidism of renal origin: Secondary | ICD-10-CM | POA: Diagnosis not present

## 2015-02-08 DIAGNOSIS — D509 Iron deficiency anemia, unspecified: Secondary | ICD-10-CM | POA: Diagnosis not present

## 2015-02-08 DIAGNOSIS — D631 Anemia in chronic kidney disease: Secondary | ICD-10-CM | POA: Diagnosis not present

## 2015-02-08 DIAGNOSIS — N186 End stage renal disease: Secondary | ICD-10-CM | POA: Diagnosis not present

## 2015-02-08 DIAGNOSIS — E119 Type 2 diabetes mellitus without complications: Secondary | ICD-10-CM | POA: Diagnosis not present

## 2015-02-08 DIAGNOSIS — E1129 Type 2 diabetes mellitus with other diabetic kidney complication: Secondary | ICD-10-CM | POA: Diagnosis not present

## 2015-02-09 ENCOUNTER — Encounter: Payer: Self-pay | Admitting: Internal Medicine

## 2015-02-09 ENCOUNTER — Ambulatory Visit (INDEPENDENT_AMBULATORY_CARE_PROVIDER_SITE_OTHER): Payer: Medicare Other | Admitting: Internal Medicine

## 2015-02-09 VITALS — BP 122/70 | HR 96 | Temp 97.6°F | Wt 194.0 lb

## 2015-02-09 DIAGNOSIS — I63412 Cerebral infarction due to embolism of left middle cerebral artery: Secondary | ICD-10-CM | POA: Diagnosis not present

## 2015-02-09 DIAGNOSIS — I110 Hypertensive heart disease with heart failure: Secondary | ICD-10-CM | POA: Diagnosis not present

## 2015-02-09 DIAGNOSIS — Z992 Dependence on renal dialysis: Secondary | ICD-10-CM

## 2015-02-09 DIAGNOSIS — E1129 Type 2 diabetes mellitus with other diabetic kidney complication: Secondary | ICD-10-CM | POA: Diagnosis not present

## 2015-02-09 DIAGNOSIS — J069 Acute upper respiratory infection, unspecified: Secondary | ICD-10-CM

## 2015-02-09 DIAGNOSIS — N186 End stage renal disease: Secondary | ICD-10-CM

## 2015-02-09 MED ORDER — CEFUROXIME AXETIL 250 MG PO TABS
250.0000 mg | ORAL_TABLET | Freq: Two times a day (BID) | ORAL | Status: DC
Start: 2015-02-09 — End: 2015-05-13

## 2015-02-09 NOTE — Progress Notes (Signed)
Pre visit review using our clinic review tool, if applicable. No additional management support is needed unless otherwise documented below in the visit note. 

## 2015-02-09 NOTE — Progress Notes (Signed)
Subjective:  Patient ID: [name], female    DOB: 1945/10/16  Age: 70 y.o. MRN: [id number]  CC: Cough   HPI [name] presents for cold sx's x 3 weeks. F/u ESRD on HD since 2011. F/u HTN, LBP, CVA  Outpatient Prescriptions Prior to Visit  Medication Sig Dispense Refill  . acetaminophen (TYLENOL) 500 MG tablet Take 500 mg by mouth every 6 (six) hours as needed (pain).    [name] amiodarone (PACERONE) 200 MG tablet Take 0.5 tablets (100 mg total) by mouth daily. 45 tablet 2  . aspirin 81 MG tablet Take 1 tablet (81 mg total) by mouth daily. 30 tablet 3  . calcium acetate (PHOSLO) 667 MG capsule Take 1,334-2,001 mg by mouth See admin instructions. Take 2001mg  three times daily with meals and Take 1334mg   with snacks.    . dorzolamide-timolol (COSOPT) 22.3-6.8 MG/ML ophthalmic solution Place 1 drop into the left eye 4 (four) times daily.  12  . Fluticasone-Salmeterol (ADVAIR) 250-50 MCG/DOSE AEPB Inhale 1 puff into the lungs daily as needed (shortness of breath).     [name] glucose blood (ONE TOUCH ULTRA TEST) test strip Check blood sugar 2 time per day. DX code E11.9 200 each 2  . metoprolol (LOPRESSOR) 50 MG tablet Take 1 tablet (50 mg total) by mouth 2 (two) times daily. Do not take this medication on dialysis days (Monday, Wednesday, Friday). 60 tablet 0  . nitroGLYCERIN (NITROSTAT) 0.4 MG SL tablet Place 1 tablet (0.4 mg total) under the tongue every 5 (five) minutes as needed for chest pain. 30 tablet 3  . prednisoLONE acetate (PRED FORTE) 1 % ophthalmic suspension Place 1 drop into the left eye 4 (four) times daily.    . repaglinide (PRANDIN) 0.5 MG tablet Take 1 tablet (0.5 mg total) by mouth 3 (three) times daily before meals. 90 tablet 11  . simvastatin (ZOCOR) 10 MG tablet Take 1 tablet (10 mg total) by mouth every evening. 90 tablet 3  . ticagrelor (BRILINTA) 90 MG TABS tablet Take 1 tablet (90 mg total) by mouth 2 (two) times daily. 60 tablet 0   Facility-Administered  Medications Prior to Visit  Medication Dose Route Frequency Provider Last Rate Last Dose  . mitoMYcin (MUTAMYCIN) Injection Use in OR only (0.4 mg/ml)  0.5 mL Left Eye Once Marylynn Pearson, MD        ROS Review of Systems  Constitutional: Negative for chills, activity change, appetite change, fatigue and unexpected weight change.  HENT: Positive for postnasal drip, rhinorrhea and sinus pressure. Negative for congestion and mouth sores.   Eyes: Negative for visual disturbance.  Respiratory: Positive for cough. Negative for chest tightness and shortness of breath.   Cardiovascular: Negative for leg swelling.  Gastrointestinal: Negative for nausea and abdominal pain.  Genitourinary: Negative for frequency, difficulty urinating and vaginal pain.  Musculoskeletal: Positive for back pain, arthralgias and gait problem.  Skin: Negative for pallor and rash.  Neurological: Positive for weakness. Negative for dizziness, tremors, syncope, numbness and headaches.  Psychiatric/Behavioral: Negative for confusion and sleep disturbance. The patient is not nervous/anxious.     Objective:  BP 122/70 mmHg  Pulse 96  Temp(Src) 97.6 F (36.4 C) (Oral)  Wt 194 lb (87.998 kg)  SpO2 94%  BP Readings from Last 3 Encounters:  02/09/15 122/70  12/01/14 164/82  11/10/14 110/50    Wt Readings from Last 3 Encounters:  02/09/15 194 lb (87.998 kg)  12/01/14 189 lb 9.6 oz (86.002 kg)  11/10/14  193 lb (87.544 kg)    Physical Exam  Constitutional: She appears well-developed. No distress.  HENT:  Head: Normocephalic.  Right Ear: External ear normal.  Left Ear: External ear normal.  Nose: Nose normal.  Mouth/Throat: Oropharynx is clear and moist.  Eyes: Conjunctivae are normal. Pupils are equal, round, and reactive to light. Right eye exhibits no discharge. Left eye exhibits no discharge.  Neck: Normal range of motion. Neck supple. No JVD present. No tracheal deviation present. No thyromegaly present.    Cardiovascular: Normal rate, regular rhythm and normal heart sounds.   Pulmonary/Chest: No stridor. No respiratory distress. She has no wheezes.  Abdominal: Soft. Bowel sounds are normal. She exhibits no distension and no mass. There is no tenderness. There is no rebound and no guarding.  Musculoskeletal: She exhibits no edema or tenderness.  Lymphadenopathy:    She has no cervical adenopathy.  Neurological: She displays normal reflexes. No cranial nerve deficit. She exhibits normal muscle tone. Coordination abnormal.  Skin: No rash noted. No erythema.  Psychiatric: She has a normal mood and affect. Her behavior is normal. Judgment and thought content normal.  Obese Ataxic - using a walker eryth throat L arm AV shunt  Lab Results  Component Value Date   WBC 14.1* 10/29/2014   HGB 10.7* 10/29/2014   HCT 33.9* 10/29/2014   PLT 330 10/29/2014   GLUCOSE 263* 10/29/2014   CHOL 166 10/25/2014   TRIG 205* 10/25/2014   HDL 27* 10/25/2014   LDLCALC 98 10/25/2014   ALT 8 09/22/2013   AST 14 09/22/2013   NA 133* 10/29/2014   K 4.6 10/29/2014   CL 94* 10/29/2014   CREATININE 5.16* 10/29/2014   BUN 32* 10/29/2014   CO2 24 10/29/2014   TSH 0.830 10/24/2014   INR 1.19 10/24/2014   HGBA1C 6.5* 10/24/2014    Dg Chest 2 View  10/23/2014  CLINICAL DATA:  Shortness of Breath EXAM: CHEST  2 VIEW COMPARISON:  September 22, 2013 FINDINGS: There is mild generalized interstitial edema. There is cardiomegaly with pulmonary venous hypertension. No airspace consolidation. There is a minimal right pleural effusion. No adenopathy. No bone lesions. There is atherosclerotic change in the aorta. IMPRESSION: Findings consistent with a degree of congestive heart failure. No airspace consolidation. Electronically Signed   By: Lowella Grip III M.D.   On: 10/23/2014 12:55    Assessment & Plan:   There are no diagnoses linked to this encounter. I am having Ms. Brain maintain her calcium acetate,  Fluticasone-Salmeterol, repaglinide, glucose blood, prednisoLONE acetate, dorzolamide-timolol, acetaminophen, amiodarone, ticagrelor, aspirin, nitroGLYCERIN, simvastatin, and metoprolol.  No orders of the defined types were placed in this encounter.     Follow-up: No Follow-up on file.  Walker Kehr, MD

## 2015-02-09 NOTE — Assessment & Plan Note (Signed)
1/17   x3 wks Start Ceftin for sinusitis

## 2015-02-09 NOTE — Assessment & Plan Note (Signed)
Using a walker Brilinta, ASA, Zocor, Toprol, Norvasc

## 2015-02-09 NOTE — Assessment & Plan Note (Signed)
  Brilinta, ASA, Zocor, Toprol, Norvasc

## 2015-02-09 NOTE — Assessment & Plan Note (Signed)
F/u ESRD on HD since 2011

## 2015-02-10 DIAGNOSIS — E1129 Type 2 diabetes mellitus with other diabetic kidney complication: Secondary | ICD-10-CM | POA: Diagnosis not present

## 2015-02-10 DIAGNOSIS — E119 Type 2 diabetes mellitus without complications: Secondary | ICD-10-CM | POA: Diagnosis not present

## 2015-02-10 DIAGNOSIS — N2581 Secondary hyperparathyroidism of renal origin: Secondary | ICD-10-CM | POA: Diagnosis not present

## 2015-02-10 DIAGNOSIS — N186 End stage renal disease: Secondary | ICD-10-CM | POA: Diagnosis not present

## 2015-02-10 DIAGNOSIS — D509 Iron deficiency anemia, unspecified: Secondary | ICD-10-CM | POA: Diagnosis not present

## 2015-02-10 DIAGNOSIS — D631 Anemia in chronic kidney disease: Secondary | ICD-10-CM | POA: Diagnosis not present

## 2015-02-11 DIAGNOSIS — L603 Nail dystrophy: Secondary | ICD-10-CM | POA: Diagnosis not present

## 2015-02-11 DIAGNOSIS — I739 Peripheral vascular disease, unspecified: Secondary | ICD-10-CM | POA: Diagnosis not present

## 2015-02-12 DIAGNOSIS — D631 Anemia in chronic kidney disease: Secondary | ICD-10-CM | POA: Diagnosis not present

## 2015-02-12 DIAGNOSIS — N186 End stage renal disease: Secondary | ICD-10-CM | POA: Diagnosis not present

## 2015-02-12 DIAGNOSIS — E1129 Type 2 diabetes mellitus with other diabetic kidney complication: Secondary | ICD-10-CM | POA: Diagnosis not present

## 2015-02-12 DIAGNOSIS — D509 Iron deficiency anemia, unspecified: Secondary | ICD-10-CM | POA: Diagnosis not present

## 2015-02-12 DIAGNOSIS — E119 Type 2 diabetes mellitus without complications: Secondary | ICD-10-CM | POA: Diagnosis not present

## 2015-02-12 DIAGNOSIS — N2581 Secondary hyperparathyroidism of renal origin: Secondary | ICD-10-CM | POA: Diagnosis not present

## 2015-02-15 DIAGNOSIS — E1129 Type 2 diabetes mellitus with other diabetic kidney complication: Secondary | ICD-10-CM | POA: Diagnosis not present

## 2015-02-15 DIAGNOSIS — D631 Anemia in chronic kidney disease: Secondary | ICD-10-CM | POA: Diagnosis not present

## 2015-02-15 DIAGNOSIS — D509 Iron deficiency anemia, unspecified: Secondary | ICD-10-CM | POA: Diagnosis not present

## 2015-02-15 DIAGNOSIS — E119 Type 2 diabetes mellitus without complications: Secondary | ICD-10-CM | POA: Diagnosis not present

## 2015-02-15 DIAGNOSIS — N186 End stage renal disease: Secondary | ICD-10-CM | POA: Diagnosis not present

## 2015-02-15 DIAGNOSIS — N2581 Secondary hyperparathyroidism of renal origin: Secondary | ICD-10-CM | POA: Diagnosis not present

## 2015-02-17 DIAGNOSIS — D631 Anemia in chronic kidney disease: Secondary | ICD-10-CM | POA: Diagnosis not present

## 2015-02-17 DIAGNOSIS — N186 End stage renal disease: Secondary | ICD-10-CM | POA: Diagnosis not present

## 2015-02-17 DIAGNOSIS — E119 Type 2 diabetes mellitus without complications: Secondary | ICD-10-CM | POA: Diagnosis not present

## 2015-02-17 DIAGNOSIS — N2581 Secondary hyperparathyroidism of renal origin: Secondary | ICD-10-CM | POA: Diagnosis not present

## 2015-02-17 DIAGNOSIS — E1129 Type 2 diabetes mellitus with other diabetic kidney complication: Secondary | ICD-10-CM | POA: Diagnosis not present

## 2015-02-17 DIAGNOSIS — D509 Iron deficiency anemia, unspecified: Secondary | ICD-10-CM | POA: Diagnosis not present

## 2015-02-18 ENCOUNTER — Other Ambulatory Visit: Payer: Self-pay | Admitting: Internal Medicine

## 2015-02-19 DIAGNOSIS — E119 Type 2 diabetes mellitus without complications: Secondary | ICD-10-CM | POA: Diagnosis not present

## 2015-02-19 DIAGNOSIS — D631 Anemia in chronic kidney disease: Secondary | ICD-10-CM | POA: Diagnosis not present

## 2015-02-19 DIAGNOSIS — N186 End stage renal disease: Secondary | ICD-10-CM | POA: Diagnosis not present

## 2015-02-19 DIAGNOSIS — E1129 Type 2 diabetes mellitus with other diabetic kidney complication: Secondary | ICD-10-CM | POA: Diagnosis not present

## 2015-02-19 DIAGNOSIS — D509 Iron deficiency anemia, unspecified: Secondary | ICD-10-CM | POA: Diagnosis not present

## 2015-02-19 DIAGNOSIS — N2581 Secondary hyperparathyroidism of renal origin: Secondary | ICD-10-CM | POA: Diagnosis not present

## 2015-02-22 DIAGNOSIS — E119 Type 2 diabetes mellitus without complications: Secondary | ICD-10-CM | POA: Diagnosis not present

## 2015-02-22 DIAGNOSIS — D509 Iron deficiency anemia, unspecified: Secondary | ICD-10-CM | POA: Diagnosis not present

## 2015-02-22 DIAGNOSIS — N2581 Secondary hyperparathyroidism of renal origin: Secondary | ICD-10-CM | POA: Diagnosis not present

## 2015-02-22 DIAGNOSIS — E1129 Type 2 diabetes mellitus with other diabetic kidney complication: Secondary | ICD-10-CM | POA: Diagnosis not present

## 2015-02-22 DIAGNOSIS — D631 Anemia in chronic kidney disease: Secondary | ICD-10-CM | POA: Diagnosis not present

## 2015-02-22 DIAGNOSIS — N186 End stage renal disease: Secondary | ICD-10-CM | POA: Diagnosis not present

## 2015-02-24 DIAGNOSIS — D631 Anemia in chronic kidney disease: Secondary | ICD-10-CM | POA: Diagnosis not present

## 2015-02-24 DIAGNOSIS — N2581 Secondary hyperparathyroidism of renal origin: Secondary | ICD-10-CM | POA: Diagnosis not present

## 2015-02-24 DIAGNOSIS — D509 Iron deficiency anemia, unspecified: Secondary | ICD-10-CM | POA: Diagnosis not present

## 2015-02-24 DIAGNOSIS — N186 End stage renal disease: Secondary | ICD-10-CM | POA: Diagnosis not present

## 2015-02-24 DIAGNOSIS — E119 Type 2 diabetes mellitus without complications: Secondary | ICD-10-CM | POA: Diagnosis not present

## 2015-02-24 DIAGNOSIS — E1129 Type 2 diabetes mellitus with other diabetic kidney complication: Secondary | ICD-10-CM | POA: Diagnosis not present

## 2015-02-26 DIAGNOSIS — D509 Iron deficiency anemia, unspecified: Secondary | ICD-10-CM | POA: Diagnosis not present

## 2015-02-26 DIAGNOSIS — N2581 Secondary hyperparathyroidism of renal origin: Secondary | ICD-10-CM | POA: Diagnosis not present

## 2015-02-26 DIAGNOSIS — D631 Anemia in chronic kidney disease: Secondary | ICD-10-CM | POA: Diagnosis not present

## 2015-02-26 DIAGNOSIS — E119 Type 2 diabetes mellitus without complications: Secondary | ICD-10-CM | POA: Diagnosis not present

## 2015-02-26 DIAGNOSIS — E1129 Type 2 diabetes mellitus with other diabetic kidney complication: Secondary | ICD-10-CM | POA: Diagnosis not present

## 2015-02-26 DIAGNOSIS — N186 End stage renal disease: Secondary | ICD-10-CM | POA: Diagnosis not present

## 2015-03-01 DIAGNOSIS — E1129 Type 2 diabetes mellitus with other diabetic kidney complication: Secondary | ICD-10-CM | POA: Diagnosis not present

## 2015-03-01 DIAGNOSIS — E119 Type 2 diabetes mellitus without complications: Secondary | ICD-10-CM | POA: Diagnosis not present

## 2015-03-01 DIAGNOSIS — D509 Iron deficiency anemia, unspecified: Secondary | ICD-10-CM | POA: Diagnosis not present

## 2015-03-01 DIAGNOSIS — N2581 Secondary hyperparathyroidism of renal origin: Secondary | ICD-10-CM | POA: Diagnosis not present

## 2015-03-01 DIAGNOSIS — D631 Anemia in chronic kidney disease: Secondary | ICD-10-CM | POA: Diagnosis not present

## 2015-03-01 DIAGNOSIS — N186 End stage renal disease: Secondary | ICD-10-CM | POA: Diagnosis not present

## 2015-03-03 DIAGNOSIS — D631 Anemia in chronic kidney disease: Secondary | ICD-10-CM | POA: Diagnosis not present

## 2015-03-03 DIAGNOSIS — D509 Iron deficiency anemia, unspecified: Secondary | ICD-10-CM | POA: Diagnosis not present

## 2015-03-03 DIAGNOSIS — E1129 Type 2 diabetes mellitus with other diabetic kidney complication: Secondary | ICD-10-CM | POA: Diagnosis not present

## 2015-03-03 DIAGNOSIS — E119 Type 2 diabetes mellitus without complications: Secondary | ICD-10-CM | POA: Diagnosis not present

## 2015-03-03 DIAGNOSIS — N2581 Secondary hyperparathyroidism of renal origin: Secondary | ICD-10-CM | POA: Diagnosis not present

## 2015-03-03 DIAGNOSIS — N186 End stage renal disease: Secondary | ICD-10-CM | POA: Diagnosis not present

## 2015-03-05 DIAGNOSIS — D509 Iron deficiency anemia, unspecified: Secondary | ICD-10-CM | POA: Diagnosis not present

## 2015-03-05 DIAGNOSIS — D631 Anemia in chronic kidney disease: Secondary | ICD-10-CM | POA: Diagnosis not present

## 2015-03-05 DIAGNOSIS — N186 End stage renal disease: Secondary | ICD-10-CM | POA: Diagnosis not present

## 2015-03-05 DIAGNOSIS — E1129 Type 2 diabetes mellitus with other diabetic kidney complication: Secondary | ICD-10-CM | POA: Diagnosis not present

## 2015-03-05 DIAGNOSIS — E119 Type 2 diabetes mellitus without complications: Secondary | ICD-10-CM | POA: Diagnosis not present

## 2015-03-05 DIAGNOSIS — N2581 Secondary hyperparathyroidism of renal origin: Secondary | ICD-10-CM | POA: Diagnosis not present

## 2015-03-08 DIAGNOSIS — E1129 Type 2 diabetes mellitus with other diabetic kidney complication: Secondary | ICD-10-CM | POA: Diagnosis not present

## 2015-03-08 DIAGNOSIS — D631 Anemia in chronic kidney disease: Secondary | ICD-10-CM | POA: Diagnosis not present

## 2015-03-08 DIAGNOSIS — N186 End stage renal disease: Secondary | ICD-10-CM | POA: Diagnosis not present

## 2015-03-08 DIAGNOSIS — E119 Type 2 diabetes mellitus without complications: Secondary | ICD-10-CM | POA: Diagnosis not present

## 2015-03-08 DIAGNOSIS — D509 Iron deficiency anemia, unspecified: Secondary | ICD-10-CM | POA: Diagnosis not present

## 2015-03-08 DIAGNOSIS — N2581 Secondary hyperparathyroidism of renal origin: Secondary | ICD-10-CM | POA: Diagnosis not present

## 2015-03-09 DIAGNOSIS — E1129 Type 2 diabetes mellitus with other diabetic kidney complication: Secondary | ICD-10-CM | POA: Diagnosis not present

## 2015-03-09 DIAGNOSIS — N186 End stage renal disease: Secondary | ICD-10-CM | POA: Diagnosis not present

## 2015-03-09 DIAGNOSIS — Z992 Dependence on renal dialysis: Secondary | ICD-10-CM | POA: Diagnosis not present

## 2015-03-10 DIAGNOSIS — E119 Type 2 diabetes mellitus without complications: Secondary | ICD-10-CM | POA: Diagnosis not present

## 2015-03-10 DIAGNOSIS — D509 Iron deficiency anemia, unspecified: Secondary | ICD-10-CM | POA: Diagnosis not present

## 2015-03-10 DIAGNOSIS — N186 End stage renal disease: Secondary | ICD-10-CM | POA: Diagnosis not present

## 2015-03-10 DIAGNOSIS — N2581 Secondary hyperparathyroidism of renal origin: Secondary | ICD-10-CM | POA: Diagnosis not present

## 2015-03-10 DIAGNOSIS — D631 Anemia in chronic kidney disease: Secondary | ICD-10-CM | POA: Diagnosis not present

## 2015-03-12 DIAGNOSIS — N186 End stage renal disease: Secondary | ICD-10-CM | POA: Diagnosis not present

## 2015-03-12 DIAGNOSIS — N2581 Secondary hyperparathyroidism of renal origin: Secondary | ICD-10-CM | POA: Diagnosis not present

## 2015-03-12 DIAGNOSIS — D509 Iron deficiency anemia, unspecified: Secondary | ICD-10-CM | POA: Diagnosis not present

## 2015-03-12 DIAGNOSIS — E119 Type 2 diabetes mellitus without complications: Secondary | ICD-10-CM | POA: Diagnosis not present

## 2015-03-12 DIAGNOSIS — D631 Anemia in chronic kidney disease: Secondary | ICD-10-CM | POA: Diagnosis not present

## 2015-03-15 DIAGNOSIS — D509 Iron deficiency anemia, unspecified: Secondary | ICD-10-CM | POA: Diagnosis not present

## 2015-03-15 DIAGNOSIS — N2581 Secondary hyperparathyroidism of renal origin: Secondary | ICD-10-CM | POA: Diagnosis not present

## 2015-03-15 DIAGNOSIS — E119 Type 2 diabetes mellitus without complications: Secondary | ICD-10-CM | POA: Diagnosis not present

## 2015-03-15 DIAGNOSIS — D631 Anemia in chronic kidney disease: Secondary | ICD-10-CM | POA: Diagnosis not present

## 2015-03-15 DIAGNOSIS — N186 End stage renal disease: Secondary | ICD-10-CM | POA: Diagnosis not present

## 2015-03-17 DIAGNOSIS — D631 Anemia in chronic kidney disease: Secondary | ICD-10-CM | POA: Diagnosis not present

## 2015-03-17 DIAGNOSIS — N186 End stage renal disease: Secondary | ICD-10-CM | POA: Diagnosis not present

## 2015-03-17 DIAGNOSIS — N2581 Secondary hyperparathyroidism of renal origin: Secondary | ICD-10-CM | POA: Diagnosis not present

## 2015-03-17 DIAGNOSIS — D509 Iron deficiency anemia, unspecified: Secondary | ICD-10-CM | POA: Diagnosis not present

## 2015-03-17 DIAGNOSIS — E119 Type 2 diabetes mellitus without complications: Secondary | ICD-10-CM | POA: Diagnosis not present

## 2015-03-19 DIAGNOSIS — N2581 Secondary hyperparathyroidism of renal origin: Secondary | ICD-10-CM | POA: Diagnosis not present

## 2015-03-19 DIAGNOSIS — D631 Anemia in chronic kidney disease: Secondary | ICD-10-CM | POA: Diagnosis not present

## 2015-03-19 DIAGNOSIS — E119 Type 2 diabetes mellitus without complications: Secondary | ICD-10-CM | POA: Diagnosis not present

## 2015-03-19 DIAGNOSIS — D509 Iron deficiency anemia, unspecified: Secondary | ICD-10-CM | POA: Diagnosis not present

## 2015-03-19 DIAGNOSIS — N186 End stage renal disease: Secondary | ICD-10-CM | POA: Diagnosis not present

## 2015-03-22 DIAGNOSIS — E119 Type 2 diabetes mellitus without complications: Secondary | ICD-10-CM | POA: Diagnosis not present

## 2015-03-22 DIAGNOSIS — N186 End stage renal disease: Secondary | ICD-10-CM | POA: Diagnosis not present

## 2015-03-22 DIAGNOSIS — N2581 Secondary hyperparathyroidism of renal origin: Secondary | ICD-10-CM | POA: Diagnosis not present

## 2015-03-22 DIAGNOSIS — D509 Iron deficiency anemia, unspecified: Secondary | ICD-10-CM | POA: Diagnosis not present

## 2015-03-22 DIAGNOSIS — D631 Anemia in chronic kidney disease: Secondary | ICD-10-CM | POA: Diagnosis not present

## 2015-03-24 DIAGNOSIS — D631 Anemia in chronic kidney disease: Secondary | ICD-10-CM | POA: Diagnosis not present

## 2015-03-24 DIAGNOSIS — D509 Iron deficiency anemia, unspecified: Secondary | ICD-10-CM | POA: Diagnosis not present

## 2015-03-24 DIAGNOSIS — E119 Type 2 diabetes mellitus without complications: Secondary | ICD-10-CM | POA: Diagnosis not present

## 2015-03-24 DIAGNOSIS — N2581 Secondary hyperparathyroidism of renal origin: Secondary | ICD-10-CM | POA: Diagnosis not present

## 2015-03-24 DIAGNOSIS — N186 End stage renal disease: Secondary | ICD-10-CM | POA: Diagnosis not present

## 2015-03-26 ENCOUNTER — Emergency Department (HOSPITAL_COMMUNITY)
Admission: EM | Admit: 2015-03-26 | Discharge: 2015-03-26 | Disposition: A | Discharge status: Home / Self Care | Payer: Medicare Other | Attending: Emergency Medicine | Admitting: Emergency Medicine

## 2015-03-26 ENCOUNTER — Encounter (HOSPITAL_COMMUNITY): Payer: Self-pay | Admitting: Nurse Practitioner

## 2015-03-26 ENCOUNTER — Emergency Department (HOSPITAL_COMMUNITY): Payer: Medicare Other

## 2015-03-26 DIAGNOSIS — Z7984 Long term (current) use of oral hypoglycemic drugs: Secondary | ICD-10-CM | POA: Diagnosis not present

## 2015-03-26 DIAGNOSIS — I132 Hypertensive heart and chronic kidney disease with heart failure and with stage 5 chronic kidney disease, or end stage renal disease: Secondary | ICD-10-CM | POA: Insufficient documentation

## 2015-03-26 DIAGNOSIS — S0990XA Unspecified injury of head, initial encounter: Secondary | ICD-10-CM | POA: Diagnosis present

## 2015-03-26 DIAGNOSIS — Z792 Long term (current) use of antibiotics: Secondary | ICD-10-CM | POA: Diagnosis not present

## 2015-03-26 DIAGNOSIS — Z7982 Long term (current) use of aspirin: Secondary | ICD-10-CM | POA: Diagnosis not present

## 2015-03-26 DIAGNOSIS — Z8673 Personal history of transient ischemic attack (TIA), and cerebral infarction without residual deficits: Secondary | ICD-10-CM | POA: Insufficient documentation

## 2015-03-26 DIAGNOSIS — Z7901 Long term (current) use of anticoagulants: Secondary | ICD-10-CM | POA: Diagnosis not present

## 2015-03-26 DIAGNOSIS — E785 Hyperlipidemia, unspecified: Secondary | ICD-10-CM | POA: Insufficient documentation

## 2015-03-26 DIAGNOSIS — E119 Type 2 diabetes mellitus without complications: Secondary | ICD-10-CM | POA: Diagnosis not present

## 2015-03-26 DIAGNOSIS — Z79899 Other long term (current) drug therapy: Secondary | ICD-10-CM | POA: Diagnosis not present

## 2015-03-26 DIAGNOSIS — Z8719 Personal history of other diseases of the digestive system: Secondary | ICD-10-CM | POA: Diagnosis not present

## 2015-03-26 DIAGNOSIS — Y998 Other external cause status: Secondary | ICD-10-CM | POA: Diagnosis not present

## 2015-03-26 DIAGNOSIS — N186 End stage renal disease: Secondary | ICD-10-CM | POA: Insufficient documentation

## 2015-03-26 DIAGNOSIS — S0093XA Contusion of unspecified part of head, initial encounter: Secondary | ICD-10-CM

## 2015-03-26 DIAGNOSIS — I2510 Atherosclerotic heart disease of native coronary artery without angina pectoris: Secondary | ICD-10-CM | POA: Diagnosis not present

## 2015-03-26 DIAGNOSIS — Z862 Personal history of diseases of the blood and blood-forming organs and certain disorders involving the immune mechanism: Secondary | ICD-10-CM | POA: Insufficient documentation

## 2015-03-26 DIAGNOSIS — S0003XA Contusion of scalp, initial encounter: Secondary | ICD-10-CM | POA: Insufficient documentation

## 2015-03-26 DIAGNOSIS — D509 Iron deficiency anemia, unspecified: Secondary | ICD-10-CM | POA: Diagnosis not present

## 2015-03-26 DIAGNOSIS — H409 Unspecified glaucoma: Secondary | ICD-10-CM | POA: Insufficient documentation

## 2015-03-26 DIAGNOSIS — I5032 Chronic diastolic (congestive) heart failure: Secondary | ICD-10-CM | POA: Insufficient documentation

## 2015-03-26 DIAGNOSIS — Y9301 Activity, walking, marching and hiking: Secondary | ICD-10-CM | POA: Insufficient documentation

## 2015-03-26 DIAGNOSIS — Y9289 Other specified places as the place of occurrence of the external cause: Secondary | ICD-10-CM | POA: Diagnosis not present

## 2015-03-26 DIAGNOSIS — Z7952 Long term (current) use of systemic steroids: Secondary | ICD-10-CM | POA: Diagnosis not present

## 2015-03-26 DIAGNOSIS — M1990 Unspecified osteoarthritis, unspecified site: Secondary | ICD-10-CM | POA: Diagnosis not present

## 2015-03-26 DIAGNOSIS — Z992 Dependence on renal dialysis: Secondary | ICD-10-CM | POA: Insufficient documentation

## 2015-03-26 DIAGNOSIS — Z85038 Personal history of other malignant neoplasm of large intestine: Secondary | ICD-10-CM | POA: Insufficient documentation

## 2015-03-26 DIAGNOSIS — I471 Supraventricular tachycardia: Secondary | ICD-10-CM | POA: Insufficient documentation

## 2015-03-26 DIAGNOSIS — N2581 Secondary hyperparathyroidism of renal origin: Secondary | ICD-10-CM | POA: Diagnosis not present

## 2015-03-26 DIAGNOSIS — W108XXA Fall (on) (from) other stairs and steps, initial encounter: Secondary | ICD-10-CM | POA: Diagnosis not present

## 2015-03-26 DIAGNOSIS — E669 Obesity, unspecified: Secondary | ICD-10-CM | POA: Diagnosis not present

## 2015-03-26 DIAGNOSIS — Z9889 Other specified postprocedural states: Secondary | ICD-10-CM | POA: Insufficient documentation

## 2015-03-26 DIAGNOSIS — D631 Anemia in chronic kidney disease: Secondary | ICD-10-CM | POA: Diagnosis not present

## 2015-03-26 DIAGNOSIS — R51 Headache: Secondary | ICD-10-CM | POA: Diagnosis not present

## 2015-03-26 NOTE — ED Notes (Signed)
Pt returned from ct

## 2015-03-26 NOTE — ED Notes (Signed)
Pt to xray

## 2015-03-26 NOTE — Discharge Instructions (Signed)

## 2015-03-26 NOTE — ED Notes (Addendum)
Pt missed a step and fell onto back of head this morning. She denies any pain or LOC. She states she went to dialysis and told them about the fall after they gave her heparin and they wanted her to come to ED for evaluation of head trauma. She is A&Ox4, breathing easily, in NAD. Non-tender hematoma noted to posterior head

## 2015-03-26 NOTE — ED Provider Notes (Signed)
CSN: [id number]     Arrival date & time 03/26/15  1532 History  By signing my name below, I, Rayna Sexton, attest that this documentation has been prepared under the direction and in the presence of Caryl Ada, [place]. Electronically Signed: Rayna Sexton, ED Scribe. 03/26/2015. 4:56 PM.    Chief Complaint  Patient presents with  . Fall   The history is provided by the patient. No language interpreter was used.   HPI Comments: SHAUNTI BATTENFIELD is a 70 y.o. female with a PMHx including DM, CAD and stroke who presents to the Emergency Department complaining of a fall that occurred this morning. She states that when walking down a set of stairs she accidentally missed a step resulting in a backwards fall with an associated, mild, point of swelling to her posterior scalp. Pt is a MWF dialysis pt and during her appointment discussed the fall with her physician after being given heparin and recommended she come to the ED for further evaluation. Pt notes she did not complete her dialysis today and had 2.5 hours left. She denies HA, LOC, numbness, tingling or any other associated symptoms at this time.   Past Medical History  Diagnosis Date  . Hyperlipidemia   . Mitral valve insufficiency and aortic valve insufficiency   . Cardiomyopathy- mixed   . 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 Chronic diastolic heart failure ([place])   . Anemia   . Coronary artery disease     Previously decreased EF; echo 113 normal LV function  . Hernia, incisional     abd  . History of colon cancer     history of colon cancer.  1986  . Arthritis   . LBBB (left bundle branch block)   . Atrial tachycardia ([place])     on amiod  . Hypertensive heart disease     sees Dr. Alain Marion  . Stroke [facility])     2011/12  . Obesity   . Hypertension   . Glaucoma   . Renal failure     ESRD, Dr Dunham/Dr. Lyda Kalata.  M, W, Fr  . GERD (gastroesophageal reflux disease)   .  Cancer [facility])     colon cancer  . Colostomy in place [facility])    Past Surgical History  Procedure Laterality Date  . Colostomy  1986  . Revision urostomy cutaneous    . Esophagogastroduodenoscopy  03-18-04  . Electrocardiogram  04-27-06  . Placement of new left forearm arteriovenous graft  03-11-08  . Left heart catheterization and right heart catheterization  12-10    R. heart cath showed elevated left and right heart filling pressures w/ pulmonary artery pressure elevated mildly out of proportion to the wedge. The left heart cath showed diffuse distal vessel disease as well as a 75% stenosis in the mid circumflex w/ a 90% stenosis of the ostial first obtuse marginal. These lesions were in close proximity. there was a 60-70%mild RCA stenosis.   . Arteriovenous graft placement  2010  . 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 Cardiac catheterization    . Eye surgery      Cataract Left  . Pars plana vitrectomy  11/29/2011    Procedure: PARS PLANA VITRECTOMY WITH 23 GAUGE;  Surgeon: Adonis Brook, MD;  Location: [place];  Service: Ophthalmology;  Laterality: Right;  Right Eye 23 ga vitrectomy with membrane peel  . Pars plana vitrectomy Left  02/28/2012    Procedure: PARS PLANA VITRECTOMY WITH 23 GAUGE;  Surgeon: Adonis Brook, MD;  Location: [place];  Service: Ophthalmology;  Laterality: Left;  . Esophagogastroduodenoscopy N/A 02/02/2013    Procedure: ESOPHAGOGASTRODUODENOSCOPY (EGD);  Surgeon: Irene Shipper, MD;  Location: [facility] ENDOSCOPY;  Service: Endoscopy;  Laterality: N/A;  . Vaginal hysterectomy    . Colonoscopy    . Insertion of ahmed valve Right 08/20/2013    Procedure: INSERTION OF AHMED VALVE WITH Mitomycin C application;  Surgeon: Marylynn Pearson, MD;  Location: [place];  Service: Ophthalmology;  Laterality: Right;  . Insertion of ahmed valve Left 07/22/2014    Procedure: INSERTION OF AHMED VALVE WITH [place];  Surgeon: Marylynn Pearson, MD;  Location: [place];   Service: Ophthalmology;  Laterality: Left;  [name] Mitomycin c application Left [id number]    Procedure: MITOMYCIN C APPLICATION;  Surgeon: Marylynn Pearson, MD;  Location: [place];  Service: Ophthalmology;  Laterality: Left;  . Cardiac catheterization N/A 10/27/2014    Procedure: Left Heart Cath and Coronary Angiography;  Surgeon: Leonie Man, MD;  Location: [place] CV LAB;  Service: Cardiovascular;  Laterality: N/A;  . Cardiac catheterization N/A 10/28/2014    Procedure: Coronary Stent Intervention;  Surgeon: Peter M Martinique, MD;  Location: [place] CV LAB;  Service: Cardiovascular;  Laterality: N/A;   Family History  Problem Relation Age of Onset  . Cancer Sister     colon  . Hypertension Other   . Hypertension Mother   . Coronary artery disease Mother   . Hypertension Father   . Diabetes Father    Social History  Substance Use Topics  . Smoking status: Never Smoker   . Smokeless tobacco: Never Used  . Alcohol Use: No   OB History    No data available     Review of Systems A complete 10 system review of systems was obtained and all systems are negative except as noted in the HPI and PMH.   Allergies  Ace inhibitors; Eggs or egg-derived products; Enalapril; Lisinopril; and Omnipaque  Home Medications   Prior to Admission medications   Medication Sig Start Date End Date Taking? Authorizing Provider  acetaminophen (TYLENOL) 500 MG tablet Take 500 mg by mouth every 6 (six) hours as needed (pain).    Historical Provider, MD  amiodarone (PACERONE) 200 MG tablet Take 0.5 tablets (100 mg total) by mouth daily. 10/29/14   Nishant Dhungel, MD  aspirin 81 MG tablet Take 1 tablet (81 mg total) by mouth daily. 10/29/14   Nishant Dhungel, MD  BRILINTA 90 MG TABS tablet TAKE 1 TABLET BY MOUTH TWICE DAILY 02/19/15   Deboraha Sprang, MD  calcium acetate (PHOSLO) 667 MG capsule Take 1,334-2,001 mg by mouth See admin instructions. Take 2001mg  three times daily with meals and Take 1334mg   with  snacks.    Historical Provider, MD  cefUROXime (CEFTIN) 250 MG tablet Take 1 tablet (250 mg total) by mouth 2 (two) times daily. 02/09/15   Aleksei Plotnikov V, MD  dorzolamide-timolol (COSOPT) 22.3-6.8 MG/ML ophthalmic solution Place 1 drop into the left eye 4 (four) times daily. 09/17/14   Historical Provider, MD  Fluticasone-Salmeterol (ADVAIR) 250-50 MCG/DOSE AEPB Inhale 1 puff into the lungs daily as needed (shortness of breath).  05/28/12   Aleksei Plotnikov V, MD  glucose blood (ONE TOUCH ULTRA TEST) test strip Check blood sugar 2 time per day. DX code E11.9 06/02/14   Renato Shin, MD  metoprolol (LOPRESSOR) 50 MG tablet Take  1 tablet (50 mg total) by mouth 2 (two) times daily. Do not take this medication on dialysis days (Monday, Wednesday, Friday). 11/10/14   Liliane Shi, PA-C  nitroGLYCERIN (NITROSTAT) 0.4 MG SL tablet Place 1 tablet (0.4 mg total) under the tongue every 5 (five) minutes as needed for chest pain. 10/29/14   Nishant Dhungel, MD  prednisoLONE acetate (PRED FORTE) 1 % ophthalmic suspension Place 1 drop into the left eye 4 (four) times daily. 10/22/14   Historical Provider, MD  repaglinide (PRANDIN) 0.5 MG tablet Take 1 tablet (0.5 mg total) by mouth 3 (three) times daily before meals. 04/16/14   Renato Shin, MD  simvastatin (ZOCOR) 10 MG tablet Take 1 tablet (10 mg total) by mouth every evening. 11/10/14   Scott T Weaver, PA-C   BP 131/57 mmHg  Pulse 103  Temp(Src) 98 F (36.7 C) (Oral)  Resp 17  SpO2 99%    Physical Exam  Constitutional: She is oriented to person, place, and time. She appears well-developed and well-nourished.  HENT:  Head: Normocephalic and atraumatic.  2 cm hematoma to occipital scalp; slightly TTP   Eyes: EOM are normal.  Neck: Normal range of motion.  Cardiovascular: Normal rate.   Pulmonary/Chest: Effort normal. No respiratory distress.  Abdominal: Soft.  Musculoskeletal: Normal range of motion.  Neurological: She is alert and oriented to  person, place, and time.  Skin: Skin is warm and dry.  Psychiatric: She has a normal mood and affect.  Nursing note and vitals reviewed.   ED Course  Procedures  DIAGNOSTIC STUDIES: Oxygen Saturation is 99% on RA, normal by my interpretation.    COORDINATION OF CARE: 4:54 PM Discussed next steps with pt. She verbalized understanding and is agreeable with the plan.   Labs Review Labs Reviewed - No data to display  Imaging Review Ct Head Wo Contrast  03/26/2015  CLINICAL DATA:  Pain following fall.  Chronic renal failure EXAM: CT HEAD WITHOUT CONTRAST TECHNIQUE: Contiguous axial images were obtained from the base of the skull through the vertex without intravenous contrast. COMPARISON:  February 03, 2013 FINDINGS: There is mild diffuse atrophy. There is no intracranial mass, hemorrhage, extra-axial fluid collection, or midline shift. There is patchy small vessel disease in the centra semiovale bilaterally. There is a prior lacunar infarct in the medial right thalamus, stable. There is no new gray-white compartment lesion. No acute infarct evident. The bony calvarium appears intact. The mastoid air cells are clear. No intraorbital lesions are apparent. Patient appears to have had scleral banding bilaterally. IMPRESSION: Atrophy with patchy periventricular small vessel disease. Prior small infarct medial right thalamus, stable. No acute infarct evident. No hemorrhage, mass, or extra-axial fluid collection. Electronically Signed   By: Lowella Grip III M.D.   On: 03/26/2015 17:52   I have personally reviewed and evaluated these images as part of my medical decision-making.   EKG Interpretation None      MDM   Final diagnoses:  Contusion of head, initial encounter   An After Visit Summary was printed and given to the patient.   I personally performed the services in this documentation, which was scribed in my presence.  The recorded information has been reviewed and considered.    Ronnald Collum.  Fransico Meadow, PA-C 03/26/15 [place], PA-C 03/26/15 1911  Julianne Rice, MD 03/31/15 [phone]

## 2015-03-26 NOTE — ED Notes (Signed)
Pt was seen b y the pa prior to myself see the triage rn and the pas notes.  Alert no distress sitting with family or friend

## 2015-03-29 DIAGNOSIS — D631 Anemia in chronic kidney disease: Secondary | ICD-10-CM | POA: Diagnosis not present

## 2015-03-29 DIAGNOSIS — D509 Iron deficiency anemia, unspecified: Secondary | ICD-10-CM | POA: Diagnosis not present

## 2015-03-29 DIAGNOSIS — N2581 Secondary hyperparathyroidism of renal origin: Secondary | ICD-10-CM | POA: Diagnosis not present

## 2015-03-29 DIAGNOSIS — N186 End stage renal disease: Secondary | ICD-10-CM | POA: Diagnosis not present

## 2015-03-29 DIAGNOSIS — E119 Type 2 diabetes mellitus without complications: Secondary | ICD-10-CM | POA: Diagnosis not present

## 2015-03-31 DIAGNOSIS — N186 End stage renal disease: Secondary | ICD-10-CM | POA: Diagnosis not present

## 2015-03-31 DIAGNOSIS — E119 Type 2 diabetes mellitus without complications: Secondary | ICD-10-CM | POA: Diagnosis not present

## 2015-03-31 DIAGNOSIS — N2581 Secondary hyperparathyroidism of renal origin: Secondary | ICD-10-CM | POA: Diagnosis not present

## 2015-03-31 DIAGNOSIS — D631 Anemia in chronic kidney disease: Secondary | ICD-10-CM | POA: Diagnosis not present

## 2015-03-31 DIAGNOSIS — D509 Iron deficiency anemia, unspecified: Secondary | ICD-10-CM | POA: Diagnosis not present

## 2015-04-02 DIAGNOSIS — E119 Type 2 diabetes mellitus without complications: Secondary | ICD-10-CM | POA: Diagnosis not present

## 2015-04-02 DIAGNOSIS — D631 Anemia in chronic kidney disease: Secondary | ICD-10-CM | POA: Diagnosis not present

## 2015-04-02 DIAGNOSIS — N186 End stage renal disease: Secondary | ICD-10-CM | POA: Diagnosis not present

## 2015-04-02 DIAGNOSIS — N2581 Secondary hyperparathyroidism of renal origin: Secondary | ICD-10-CM | POA: Diagnosis not present

## 2015-04-02 DIAGNOSIS — D509 Iron deficiency anemia, unspecified: Secondary | ICD-10-CM | POA: Diagnosis not present

## 2015-04-05 DIAGNOSIS — E119 Type 2 diabetes mellitus without complications: Secondary | ICD-10-CM | POA: Diagnosis not present

## 2015-04-05 DIAGNOSIS — N186 End stage renal disease: Secondary | ICD-10-CM | POA: Diagnosis not present

## 2015-04-05 DIAGNOSIS — D509 Iron deficiency anemia, unspecified: Secondary | ICD-10-CM | POA: Diagnosis not present

## 2015-04-05 DIAGNOSIS — N2581 Secondary hyperparathyroidism of renal origin: Secondary | ICD-10-CM | POA: Diagnosis not present

## 2015-04-05 DIAGNOSIS — D631 Anemia in chronic kidney disease: Secondary | ICD-10-CM | POA: Diagnosis not present

## 2015-04-06 DIAGNOSIS — H401133 Primary open-angle glaucoma, bilateral, severe stage: Secondary | ICD-10-CM | POA: Diagnosis not present

## 2015-04-07 DIAGNOSIS — E119 Type 2 diabetes mellitus without complications: Secondary | ICD-10-CM | POA: Diagnosis not present

## 2015-04-07 DIAGNOSIS — D509 Iron deficiency anemia, unspecified: Secondary | ICD-10-CM | POA: Diagnosis not present

## 2015-04-07 DIAGNOSIS — N186 End stage renal disease: Secondary | ICD-10-CM | POA: Diagnosis not present

## 2015-04-07 DIAGNOSIS — D631 Anemia in chronic kidney disease: Secondary | ICD-10-CM | POA: Diagnosis not present

## 2015-04-07 DIAGNOSIS — N2581 Secondary hyperparathyroidism of renal origin: Secondary | ICD-10-CM | POA: Diagnosis not present

## 2015-04-09 DIAGNOSIS — Z992 Dependence on renal dialysis: Secondary | ICD-10-CM | POA: Diagnosis not present

## 2015-04-09 DIAGNOSIS — E119 Type 2 diabetes mellitus without complications: Secondary | ICD-10-CM | POA: Diagnosis not present

## 2015-04-09 DIAGNOSIS — N186 End stage renal disease: Secondary | ICD-10-CM | POA: Diagnosis not present

## 2015-04-09 DIAGNOSIS — D509 Iron deficiency anemia, unspecified: Secondary | ICD-10-CM | POA: Diagnosis not present

## 2015-04-09 DIAGNOSIS — E1129 Type 2 diabetes mellitus with other diabetic kidney complication: Secondary | ICD-10-CM | POA: Diagnosis not present

## 2015-04-09 DIAGNOSIS — N2581 Secondary hyperparathyroidism of renal origin: Secondary | ICD-10-CM | POA: Diagnosis not present

## 2015-04-09 DIAGNOSIS — D631 Anemia in chronic kidney disease: Secondary | ICD-10-CM | POA: Diagnosis not present

## 2015-04-12 DIAGNOSIS — D631 Anemia in chronic kidney disease: Secondary | ICD-10-CM | POA: Diagnosis not present

## 2015-04-12 DIAGNOSIS — E119 Type 2 diabetes mellitus without complications: Secondary | ICD-10-CM | POA: Diagnosis not present

## 2015-04-12 DIAGNOSIS — D509 Iron deficiency anemia, unspecified: Secondary | ICD-10-CM | POA: Diagnosis not present

## 2015-04-12 DIAGNOSIS — N186 End stage renal disease: Secondary | ICD-10-CM | POA: Diagnosis not present

## 2015-04-12 DIAGNOSIS — N2581 Secondary hyperparathyroidism of renal origin: Secondary | ICD-10-CM | POA: Diagnosis not present

## 2015-04-14 DIAGNOSIS — E119 Type 2 diabetes mellitus without complications: Secondary | ICD-10-CM | POA: Diagnosis not present

## 2015-04-14 DIAGNOSIS — D509 Iron deficiency anemia, unspecified: Secondary | ICD-10-CM | POA: Diagnosis not present

## 2015-04-14 DIAGNOSIS — N186 End stage renal disease: Secondary | ICD-10-CM | POA: Diagnosis not present

## 2015-04-14 DIAGNOSIS — D631 Anemia in chronic kidney disease: Secondary | ICD-10-CM | POA: Diagnosis not present

## 2015-04-14 DIAGNOSIS — N2581 Secondary hyperparathyroidism of renal origin: Secondary | ICD-10-CM | POA: Diagnosis not present

## 2015-04-15 ENCOUNTER — Ambulatory Visit: Payer: Medicare Other | Admitting: Endocrinology

## 2015-04-16 DIAGNOSIS — N186 End stage renal disease: Secondary | ICD-10-CM | POA: Diagnosis not present

## 2015-04-16 DIAGNOSIS — D509 Iron deficiency anemia, unspecified: Secondary | ICD-10-CM | POA: Diagnosis not present

## 2015-04-16 DIAGNOSIS — D631 Anemia in chronic kidney disease: Secondary | ICD-10-CM | POA: Diagnosis not present

## 2015-04-16 DIAGNOSIS — N2581 Secondary hyperparathyroidism of renal origin: Secondary | ICD-10-CM | POA: Diagnosis not present

## 2015-04-16 DIAGNOSIS — E119 Type 2 diabetes mellitus without complications: Secondary | ICD-10-CM | POA: Diagnosis not present

## 2015-04-19 DIAGNOSIS — D509 Iron deficiency anemia, unspecified: Secondary | ICD-10-CM | POA: Diagnosis not present

## 2015-04-19 DIAGNOSIS — N186 End stage renal disease: Secondary | ICD-10-CM | POA: Diagnosis not present

## 2015-04-19 DIAGNOSIS — N2581 Secondary hyperparathyroidism of renal origin: Secondary | ICD-10-CM | POA: Diagnosis not present

## 2015-04-19 DIAGNOSIS — E119 Type 2 diabetes mellitus without complications: Secondary | ICD-10-CM | POA: Diagnosis not present

## 2015-04-19 DIAGNOSIS — D631 Anemia in chronic kidney disease: Secondary | ICD-10-CM | POA: Diagnosis not present

## 2015-04-21 DIAGNOSIS — N186 End stage renal disease: Secondary | ICD-10-CM | POA: Diagnosis not present

## 2015-04-21 DIAGNOSIS — E119 Type 2 diabetes mellitus without complications: Secondary | ICD-10-CM | POA: Diagnosis not present

## 2015-04-21 DIAGNOSIS — D631 Anemia in chronic kidney disease: Secondary | ICD-10-CM | POA: Diagnosis not present

## 2015-04-21 DIAGNOSIS — N2581 Secondary hyperparathyroidism of renal origin: Secondary | ICD-10-CM | POA: Diagnosis not present

## 2015-04-21 DIAGNOSIS — D509 Iron deficiency anemia, unspecified: Secondary | ICD-10-CM | POA: Diagnosis not present

## 2015-04-23 DIAGNOSIS — N186 End stage renal disease: Secondary | ICD-10-CM | POA: Diagnosis not present

## 2015-04-23 DIAGNOSIS — N2581 Secondary hyperparathyroidism of renal origin: Secondary | ICD-10-CM | POA: Diagnosis not present

## 2015-04-23 DIAGNOSIS — E119 Type 2 diabetes mellitus without complications: Secondary | ICD-10-CM | POA: Diagnosis not present

## 2015-04-23 DIAGNOSIS — D631 Anemia in chronic kidney disease: Secondary | ICD-10-CM | POA: Diagnosis not present

## 2015-04-23 DIAGNOSIS — D509 Iron deficiency anemia, unspecified: Secondary | ICD-10-CM | POA: Diagnosis not present

## 2015-04-26 DIAGNOSIS — D631 Anemia in chronic kidney disease: Secondary | ICD-10-CM | POA: Diagnosis not present

## 2015-04-26 DIAGNOSIS — N186 End stage renal disease: Secondary | ICD-10-CM | POA: Diagnosis not present

## 2015-04-26 DIAGNOSIS — D509 Iron deficiency anemia, unspecified: Secondary | ICD-10-CM | POA: Diagnosis not present

## 2015-04-26 DIAGNOSIS — N2581 Secondary hyperparathyroidism of renal origin: Secondary | ICD-10-CM | POA: Diagnosis not present

## 2015-04-26 DIAGNOSIS — E119 Type 2 diabetes mellitus without complications: Secondary | ICD-10-CM | POA: Diagnosis not present

## 2015-04-28 DIAGNOSIS — N2581 Secondary hyperparathyroidism of renal origin: Secondary | ICD-10-CM | POA: Diagnosis not present

## 2015-04-28 DIAGNOSIS — N186 End stage renal disease: Secondary | ICD-10-CM | POA: Diagnosis not present

## 2015-04-28 DIAGNOSIS — E119 Type 2 diabetes mellitus without complications: Secondary | ICD-10-CM | POA: Diagnosis not present

## 2015-04-28 DIAGNOSIS — D509 Iron deficiency anemia, unspecified: Secondary | ICD-10-CM | POA: Diagnosis not present

## 2015-04-28 DIAGNOSIS — D631 Anemia in chronic kidney disease: Secondary | ICD-10-CM | POA: Diagnosis not present

## 2015-04-29 ENCOUNTER — Other Ambulatory Visit: Payer: Self-pay | Admitting: *Deleted

## 2015-04-29 MED ORDER — AMIODARONE HCL 200 MG PO TABS: 100.0000 mg | ORAL_TABLET | Freq: Every day | ORAL | Status: DC

## 2015-04-30 DIAGNOSIS — N2581 Secondary hyperparathyroidism of renal origin: Secondary | ICD-10-CM | POA: Diagnosis not present

## 2015-04-30 DIAGNOSIS — E119 Type 2 diabetes mellitus without complications: Secondary | ICD-10-CM | POA: Diagnosis not present

## 2015-04-30 DIAGNOSIS — N186 End stage renal disease: Secondary | ICD-10-CM | POA: Diagnosis not present

## 2015-04-30 DIAGNOSIS — D509 Iron deficiency anemia, unspecified: Secondary | ICD-10-CM | POA: Diagnosis not present

## 2015-04-30 DIAGNOSIS — D631 Anemia in chronic kidney disease: Secondary | ICD-10-CM | POA: Diagnosis not present

## 2015-05-03 DIAGNOSIS — E119 Type 2 diabetes mellitus without complications: Secondary | ICD-10-CM | POA: Diagnosis not present

## 2015-05-03 DIAGNOSIS — D631 Anemia in chronic kidney disease: Secondary | ICD-10-CM | POA: Diagnosis not present

## 2015-05-03 DIAGNOSIS — N2581 Secondary hyperparathyroidism of renal origin: Secondary | ICD-10-CM | POA: Diagnosis not present

## 2015-05-03 DIAGNOSIS — D509 Iron deficiency anemia, unspecified: Secondary | ICD-10-CM | POA: Diagnosis not present

## 2015-05-03 DIAGNOSIS — N186 End stage renal disease: Secondary | ICD-10-CM | POA: Diagnosis not present

## 2015-05-05 DIAGNOSIS — E1129 Type 2 diabetes mellitus with other diabetic kidney complication: Secondary | ICD-10-CM | POA: Diagnosis not present

## 2015-05-05 DIAGNOSIS — N186 End stage renal disease: Secondary | ICD-10-CM | POA: Diagnosis not present

## 2015-05-05 DIAGNOSIS — D631 Anemia in chronic kidney disease: Secondary | ICD-10-CM | POA: Diagnosis not present

## 2015-05-05 DIAGNOSIS — N2581 Secondary hyperparathyroidism of renal origin: Secondary | ICD-10-CM | POA: Diagnosis not present

## 2015-05-05 DIAGNOSIS — D509 Iron deficiency anemia, unspecified: Secondary | ICD-10-CM | POA: Diagnosis not present

## 2015-05-05 DIAGNOSIS — E119 Type 2 diabetes mellitus without complications: Secondary | ICD-10-CM | POA: Diagnosis not present

## 2015-05-07 DIAGNOSIS — E119 Type 2 diabetes mellitus without complications: Secondary | ICD-10-CM | POA: Diagnosis not present

## 2015-05-07 DIAGNOSIS — N2581 Secondary hyperparathyroidism of renal origin: Secondary | ICD-10-CM | POA: Diagnosis not present

## 2015-05-07 DIAGNOSIS — D631 Anemia in chronic kidney disease: Secondary | ICD-10-CM | POA: Diagnosis not present

## 2015-05-07 DIAGNOSIS — D509 Iron deficiency anemia, unspecified: Secondary | ICD-10-CM | POA: Diagnosis not present

## 2015-05-07 DIAGNOSIS — N186 End stage renal disease: Secondary | ICD-10-CM | POA: Diagnosis not present

## 2015-05-09 DIAGNOSIS — N186 End stage renal disease: Secondary | ICD-10-CM | POA: Diagnosis not present

## 2015-05-09 DIAGNOSIS — E1129 Type 2 diabetes mellitus with other diabetic kidney complication: Secondary | ICD-10-CM | POA: Diagnosis not present

## 2015-05-09 DIAGNOSIS — Z992 Dependence on renal dialysis: Secondary | ICD-10-CM | POA: Diagnosis not present

## 2015-05-10 DIAGNOSIS — N186 End stage renal disease: Secondary | ICD-10-CM | POA: Diagnosis not present

## 2015-05-10 DIAGNOSIS — N2581 Secondary hyperparathyroidism of renal origin: Secondary | ICD-10-CM | POA: Diagnosis not present

## 2015-05-10 DIAGNOSIS — D631 Anemia in chronic kidney disease: Secondary | ICD-10-CM | POA: Diagnosis not present

## 2015-05-10 DIAGNOSIS — D509 Iron deficiency anemia, unspecified: Secondary | ICD-10-CM | POA: Diagnosis not present

## 2015-05-10 DIAGNOSIS — E119 Type 2 diabetes mellitus without complications: Secondary | ICD-10-CM | POA: Diagnosis not present

## 2015-05-12 DIAGNOSIS — E119 Type 2 diabetes mellitus without complications: Secondary | ICD-10-CM | POA: Diagnosis not present

## 2015-05-12 DIAGNOSIS — D631 Anemia in chronic kidney disease: Secondary | ICD-10-CM | POA: Diagnosis not present

## 2015-05-12 DIAGNOSIS — D509 Iron deficiency anemia, unspecified: Secondary | ICD-10-CM | POA: Diagnosis not present

## 2015-05-12 DIAGNOSIS — N186 End stage renal disease: Secondary | ICD-10-CM | POA: Diagnosis not present

## 2015-05-12 DIAGNOSIS — N2581 Secondary hyperparathyroidism of renal origin: Secondary | ICD-10-CM | POA: Diagnosis not present

## 2015-05-13 ENCOUNTER — Encounter: Payer: Self-pay | Admitting: Endocrinology

## 2015-05-13 ENCOUNTER — Ambulatory Visit (INDEPENDENT_AMBULATORY_CARE_PROVIDER_SITE_OTHER): Payer: Medicare Other | Admitting: Endocrinology

## 2015-05-13 VITALS — BP 134/64 | HR 102 | Temp 97.5°F | Wt 195.1 lb

## 2015-05-13 DIAGNOSIS — Z992 Dependence on renal dialysis: Secondary | ICD-10-CM

## 2015-05-13 DIAGNOSIS — E1122 Type 2 diabetes mellitus with diabetic chronic kidney disease: Secondary | ICD-10-CM

## 2015-05-13 DIAGNOSIS — N186 End stage renal disease: Secondary | ICD-10-CM | POA: Diagnosis not present

## 2015-05-13 DIAGNOSIS — I63412 Cerebral infarction due to embolism of left middle cerebral artery: Secondary | ICD-10-CM | POA: Diagnosis not present

## 2015-05-13 DIAGNOSIS — E119 Type 2 diabetes mellitus without complications: Secondary | ICD-10-CM | POA: Insufficient documentation

## 2015-05-13 LAB — POCT GLYCOSYLATED HEMOGLOBIN (HGB A1C): Hemoglobin A1C: 6.8

## 2015-05-13 NOTE — Patient Instructions (Addendum)
Please continue the same medication Please come back for a follow-up appointment in 4 months.   check your blood sugar once a day.  vary the time of day when you check, between before the 3 meals, and at bedtime.  also check if you have symptoms of your blood sugar being too high or too low.  please keep a record of the readings and bring it to your next appointment here.  please call us sooner if you are having low blood sugar episodes, or if it stays over 200.

## 2015-05-13 NOTE — Progress Notes (Signed)
Pre visit review using our clinic review tool, if applicable. No additional management support is needed unless otherwise documented below in the visit note. 

## 2015-05-13 NOTE — Progress Notes (Signed)
Subjective:    Patient ID: [name], female    DOB: 17-Nov-1945, 70 y.o.   MRN: [id number]  HPI Pt returns for f/u of diabetes mellitus: DM type: Insulin-requiring type 2 Dx'ed: [id number] Complications: polyneuropathy, renal failure (dialysis), PAD, CVA, CHF, and right foot transmetatarsal amputation.   Therapy: repaglinide. GDM: never DKA: never Severe hypoglycemia: last episode was 2013 Pancreatitis: never Other: she took insulin from 2007-2017 Interval history: no cbg record, but states cbg's are in the low-100's.  pt states she feels well in general.   Past Medical History  Diagnosis Date  . Hyperlipidemia   . Mitral valve insufficiency and aortic valve insufficiency   . Cardiomyopathy- mixed   . 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 Chronic diastolic heart failure ([place])   . Anemia   . Coronary artery disease     Previously decreased EF; echo 113 normal LV function  . Hernia, incisional     abd  . History of colon cancer     history of colon cancer.  1986  . Arthritis   . LBBB (left bundle branch block)   . Atrial tachycardia ([place])     on amiod  . Hypertensive heart disease     sees Dr. Alain Marion  . Stroke [facility])     2011/12  . Obesity   . Hypertension   . Glaucoma   . Renal failure     ESRD, Dr Dunham/Dr. Lyda Kalata.  M, W, Fr  . GERD (gastroesophageal reflux disease)   . Cancer [facility])     colon cancer  . Colostomy in place [facility])     Past Surgical History  Procedure Laterality Date  . Colostomy  1986  . Revision urostomy cutaneous    . Esophagogastroduodenoscopy  03-18-04  . Electrocardiogram  04-27-06  . Placement of new left forearm arteriovenous graft  03-11-08  . Left heart catheterization and right heart catheterization  12-10    R. heart cath showed elevated left and right heart filling pressures w/ pulmonary artery pressure elevated mildly out of proportion to the wedge. The left heart cath showed  diffuse distal vessel disease as well as a 75% stenosis in the mid circumflex w/ a 90% stenosis of the ostial first obtuse marginal. These lesions were in close proximity. there was a 60-70%mild RCA stenosis.   . Arteriovenous graft placement  2010  . 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 Cardiac catheterization    . Eye surgery      Cataract Left  . Pars plana vitrectomy  11/29/2011    Procedure: PARS PLANA VITRECTOMY WITH 23 GAUGE;  Surgeon: Adonis Brook, MD;  Location: [place];  Service: Ophthalmology;  Laterality: Right;  Right Eye 23 ga vitrectomy with membrane peel  . Pars plana vitrectomy Left 02/28/2012    Procedure: PARS PLANA VITRECTOMY WITH 23 GAUGE;  Surgeon: Adonis Brook, MD;  Location: [place];  Service: Ophthalmology;  Laterality: Left;  . Esophagogastroduodenoscopy N/A 02/02/2013    Procedure: ESOPHAGOGASTRODUODENOSCOPY (EGD);  Surgeon: Irene Shipper, MD;  Location: [facility] ENDOSCOPY;  Service: Endoscopy;  Laterality: N/A;  . Vaginal hysterectomy    . Colonoscopy    . Insertion of ahmed valve Right 08/20/2013    Procedure: INSERTION OF AHMED VALVE WITH Mitomycin C application;  Surgeon: Marylynn Pearson, MD;  Location: [place];  Service: Ophthalmology;  Laterality: Right;  . Insertion of ahmed valve  Left 07/22/2014    Procedure: INSERTION OF AHMED VALVE WITH [place];  Surgeon: Marylynn Pearson, MD;  Location: [place];  Service: Ophthalmology;  Laterality: Left;  [name] Mitomycin c application Left [id number]    Procedure: MITOMYCIN C APPLICATION;  Surgeon: Marylynn Pearson, MD;  Location: [place];  Service: Ophthalmology;  Laterality: Left;  . Cardiac catheterization N/A 10/27/2014    Procedure: Left Heart Cath and Coronary Angiography;  Surgeon: Leonie Man, MD;  Location: [place] CV LAB;  Service: Cardiovascular;  Laterality: N/A;  . Cardiac catheterization N/A 10/28/2014    Procedure: Coronary Stent Intervention;  Surgeon: Peter M Martinique, MD;  Location: [place] CV LAB;  Service: Cardiovascular;  Laterality: N/A;    Social History   Social History  . Marital Status: Single    Spouse Name: N/A  . Number of Children: N/A  . Years of Education: N/A   Occupational History  . Not on file.   Social History Main Topics  . Smoking status: Never Smoker   . Smokeless tobacco: Never Used  . Alcohol Use: No  . Drug Use: No  . Sexual Activity: No   Other Topics Concern  . Not on file   Social History Narrative    Current Outpatient Prescriptions on File Prior to Visit  Medication Sig Dispense Refill  . acetaminophen (TYLENOL) 500 MG tablet Take 500 mg by mouth every 6 (six) hours as needed (pain).    [name] amiodarone (PACERONE) 200 MG tablet Take 0.5 tablets (100 mg total) by mouth daily. 45 tablet 1  . aspirin 81 MG tablet Take 1 tablet (81 mg total) by mouth daily. 30 tablet 3  . BRILINTA 90 MG TABS tablet TAKE 1 TABLET BY MOUTH TWICE DAILY 60 tablet 9  . calcium acetate (PHOSLO) 667 MG capsule Take 1,334-2,001 mg by mouth See admin instructions. Take 2001mg  three times daily with meals and Take 1334mg   with snacks.    . dorzolamide-timolol (COSOPT) 22.3-6.8 MG/ML ophthalmic solution Place 1 drop into the left eye 4 (four) times daily.  12  . Fluticasone-Salmeterol (ADVAIR) 250-50 MCG/DOSE AEPB Inhale 1 puff into the lungs daily as needed (shortness of breath).     [name] glucose blood (ONE TOUCH ULTRA TEST) test strip Check blood sugar 2 time per day. DX code E11.9 200 each 2  . metoprolol (LOPRESSOR) 50 MG tablet Take 1 tablet (50 mg total) by mouth 2 (two) times daily. Do not take this medication on dialysis days (Monday, Wednesday, Friday). 60 tablet 0  . nitroGLYCERIN (NITROSTAT) 0.4 MG SL tablet Place 1 tablet (0.4 mg total) under the tongue every 5 (five) minutes as needed for chest pain. 30 tablet 3  . prednisoLONE acetate (PRED FORTE) 1 % ophthalmic suspension Place 1 drop into the left eye 4 (four) times daily.    . repaglinide  (PRANDIN) 0.5 MG tablet Take 1 tablet (0.5 mg total) by mouth 3 (three) times daily before meals. 90 tablet 11  . simvastatin (ZOCOR) 10 MG tablet Take 1 tablet (10 mg total) by mouth every evening. 90 tablet 3   Current Facility-Administered Medications on File Prior to Visit  Medication Dose Route Frequency Provider Last Rate Last Dose  . mitoMYcin (MUTAMYCIN) Injection Use in OR only (0.4 mg/ml)  0.5 mL Left Eye Once Marylynn Pearson, MD        Allergies  Allergen Reactions  . Ace Inhibitors Cough  . Eggs Or Egg-Derived Products Nausea And Vomiting  . Enalapril Cough  .  Lisinopril Cough  . Omnipaque [Iohexol] Hives    Family History  Problem Relation Age of Onset  . Cancer Sister     colon  . Hypertension Other   . Hypertension Mother   . Coronary artery disease Mother   . Hypertension Father   . Diabetes Father     BP 134/64 mmHg  Pulse 102  Temp(Src) 97.5 F (36.4 C) (Oral)  Wt 195 lb 2 oz (88.508 kg)  SpO2 95%  Review of Systems She denies hypoglycemia.      Objective:   Physical Exam VITAL SIGNS:  See vs page GENERAL: no distress.  Pulses: dorsalis pedis absent bilat.  Feet: no edema. There is a healed surgical scar (transmetatarsal amputation) of the right foot. There is severe onychomycosis of the left toenails.  Skin: no ulcer on the feet. feet are of normal color and temp.   Neuro: sensation is intact to touch on the feet, but decreased from normal.    A1c=6.8%    Assessment & Plan:  DM: well-controlled.  Patient is advised the following: Patient Instructions  Please continue the same medication Please come back for a follow-up appointment in 4 months.   check your blood sugar once a day.  vary the time of day when you check, between before the 3 meals, and at bedtime.  also check if you have symptoms of your blood sugar being too high or too low.  please keep a record of the readings and bring it to your next appointment here.  please call us sooner  if you are having low blood sugar episodes, or if it stays over 200.

## 2015-05-14 DIAGNOSIS — D509 Iron deficiency anemia, unspecified: Secondary | ICD-10-CM | POA: Diagnosis not present

## 2015-05-14 DIAGNOSIS — E119 Type 2 diabetes mellitus without complications: Secondary | ICD-10-CM | POA: Diagnosis not present

## 2015-05-14 DIAGNOSIS — N186 End stage renal disease: Secondary | ICD-10-CM | POA: Diagnosis not present

## 2015-05-14 DIAGNOSIS — N2581 Secondary hyperparathyroidism of renal origin: Secondary | ICD-10-CM | POA: Diagnosis not present

## 2015-05-14 DIAGNOSIS — D631 Anemia in chronic kidney disease: Secondary | ICD-10-CM | POA: Diagnosis not present

## 2015-05-17 DIAGNOSIS — E119 Type 2 diabetes mellitus without complications: Secondary | ICD-10-CM | POA: Diagnosis not present

## 2015-05-17 DIAGNOSIS — D631 Anemia in chronic kidney disease: Secondary | ICD-10-CM | POA: Diagnosis not present

## 2015-05-17 DIAGNOSIS — N2581 Secondary hyperparathyroidism of renal origin: Secondary | ICD-10-CM | POA: Diagnosis not present

## 2015-05-17 DIAGNOSIS — N186 End stage renal disease: Secondary | ICD-10-CM | POA: Diagnosis not present

## 2015-05-17 DIAGNOSIS — D509 Iron deficiency anemia, unspecified: Secondary | ICD-10-CM | POA: Diagnosis not present

## 2015-05-19 DIAGNOSIS — N2581 Secondary hyperparathyroidism of renal origin: Secondary | ICD-10-CM | POA: Diagnosis not present

## 2015-05-19 DIAGNOSIS — N186 End stage renal disease: Secondary | ICD-10-CM | POA: Diagnosis not present

## 2015-05-19 DIAGNOSIS — D631 Anemia in chronic kidney disease: Secondary | ICD-10-CM | POA: Diagnosis not present

## 2015-05-19 DIAGNOSIS — E119 Type 2 diabetes mellitus without complications: Secondary | ICD-10-CM | POA: Diagnosis not present

## 2015-05-19 DIAGNOSIS — D509 Iron deficiency anemia, unspecified: Secondary | ICD-10-CM | POA: Diagnosis not present

## 2015-05-21 DIAGNOSIS — D509 Iron deficiency anemia, unspecified: Secondary | ICD-10-CM | POA: Diagnosis not present

## 2015-05-21 DIAGNOSIS — N2581 Secondary hyperparathyroidism of renal origin: Secondary | ICD-10-CM | POA: Diagnosis not present

## 2015-05-21 DIAGNOSIS — N186 End stage renal disease: Secondary | ICD-10-CM | POA: Diagnosis not present

## 2015-05-21 DIAGNOSIS — D631 Anemia in chronic kidney disease: Secondary | ICD-10-CM | POA: Diagnosis not present

## 2015-05-21 DIAGNOSIS — E119 Type 2 diabetes mellitus without complications: Secondary | ICD-10-CM | POA: Diagnosis not present

## 2015-05-24 DIAGNOSIS — D509 Iron deficiency anemia, unspecified: Secondary | ICD-10-CM | POA: Diagnosis not present

## 2015-05-24 DIAGNOSIS — D631 Anemia in chronic kidney disease: Secondary | ICD-10-CM | POA: Diagnosis not present

## 2015-05-24 DIAGNOSIS — N2581 Secondary hyperparathyroidism of renal origin: Secondary | ICD-10-CM | POA: Diagnosis not present

## 2015-05-24 DIAGNOSIS — E119 Type 2 diabetes mellitus without complications: Secondary | ICD-10-CM | POA: Diagnosis not present

## 2015-05-24 DIAGNOSIS — N186 End stage renal disease: Secondary | ICD-10-CM | POA: Diagnosis not present

## 2015-05-26 ENCOUNTER — Telehealth: Payer: Self-pay | Admitting: Internal Medicine

## 2015-05-26 DIAGNOSIS — D631 Anemia in chronic kidney disease: Secondary | ICD-10-CM | POA: Diagnosis not present

## 2015-05-26 DIAGNOSIS — N2581 Secondary hyperparathyroidism of renal origin: Secondary | ICD-10-CM | POA: Diagnosis not present

## 2015-05-26 DIAGNOSIS — E119 Type 2 diabetes mellitus without complications: Secondary | ICD-10-CM | POA: Diagnosis not present

## 2015-05-26 DIAGNOSIS — N186 End stage renal disease: Secondary | ICD-10-CM | POA: Diagnosis not present

## 2015-05-26 DIAGNOSIS — D509 Iron deficiency anemia, unspecified: Secondary | ICD-10-CM | POA: Diagnosis not present

## 2015-05-26 NOTE — Telephone Encounter (Signed)
Patient was schedule for echo ordered by Nicki Reaper on 02-02-15 and no showed for the appointment.   Charlena Cross called the patient on 02/19/15 and 03/11/05 unable to reach do to mailbox was full.  Neoma Laming called the patient on 04/21/15 and left message to call and schedule and Sonya call again on 04/27/2015.  Patient has not called back to schedule.

## 2015-05-28 DIAGNOSIS — D509 Iron deficiency anemia, unspecified: Secondary | ICD-10-CM | POA: Diagnosis not present

## 2015-05-28 DIAGNOSIS — D631 Anemia in chronic kidney disease: Secondary | ICD-10-CM | POA: Diagnosis not present

## 2015-05-28 DIAGNOSIS — N186 End stage renal disease: Secondary | ICD-10-CM | POA: Diagnosis not present

## 2015-05-28 DIAGNOSIS — E119 Type 2 diabetes mellitus without complications: Secondary | ICD-10-CM | POA: Diagnosis not present

## 2015-05-28 DIAGNOSIS — N2581 Secondary hyperparathyroidism of renal origin: Secondary | ICD-10-CM | POA: Diagnosis not present

## 2015-05-31 DIAGNOSIS — E119 Type 2 diabetes mellitus without complications: Secondary | ICD-10-CM | POA: Diagnosis not present

## 2015-05-31 DIAGNOSIS — D631 Anemia in chronic kidney disease: Secondary | ICD-10-CM | POA: Diagnosis not present

## 2015-05-31 DIAGNOSIS — N2581 Secondary hyperparathyroidism of renal origin: Secondary | ICD-10-CM | POA: Diagnosis not present

## 2015-05-31 DIAGNOSIS — D509 Iron deficiency anemia, unspecified: Secondary | ICD-10-CM | POA: Diagnosis not present

## 2015-05-31 DIAGNOSIS — N186 End stage renal disease: Secondary | ICD-10-CM | POA: Diagnosis not present

## 2015-06-01 ENCOUNTER — Encounter: Payer: Self-pay | Admitting: Nurse Practitioner

## 2015-06-01 ENCOUNTER — Ambulatory Visit (INDEPENDENT_AMBULATORY_CARE_PROVIDER_SITE_OTHER): Payer: Medicare Other | Admitting: Nurse Practitioner

## 2015-06-01 VITALS — BP 120/68 | HR 100 | Ht 60.0 in | Wt 194.0 lb

## 2015-06-01 DIAGNOSIS — I255 Ischemic cardiomyopathy: Secondary | ICD-10-CM | POA: Diagnosis not present

## 2015-06-01 DIAGNOSIS — I471 Supraventricular tachycardia: Secondary | ICD-10-CM

## 2015-06-01 DIAGNOSIS — I5022 Chronic systolic (congestive) heart failure: Secondary | ICD-10-CM | POA: Diagnosis not present

## 2015-06-01 DIAGNOSIS — I10 Essential (primary) hypertension: Secondary | ICD-10-CM

## 2015-06-01 MED ORDER — SIMVASTATIN 10 MG PO TABS: 10.0000 mg | ORAL_TABLET | Freq: Every evening | ORAL | Status: DC

## 2015-06-01 NOTE — Patient Instructions (Signed)
Medication Instructions:   Your physician recommends that you continue on your current medications as directed. Please refer to the Current Medication list given to you today.   If you need a refill on your cardiac medications before your next appointment, please call your pharmacy.  Labwork:  LAB REQUEST ARE BEING SENT HOME ON  A PRESCIRPTION  FORM WITH YOU TO BE DRAWN AT DIALYSIS    Testing/Procedures:Your physician has requested that you have an echocardiogram.AT [place]   Echocardiography is a painless test that uses sound waves to create images of your heart. It provides your doctor with information about the size and shape of your heart and how well your heart's chambers and valves are working. This procedure takes approximately one hour. There are no restrictions for this procedure.    Follow-Up:  IN October WITH DR Caryl Comes    Any Other Special Instructions Will Be Listed Below (If Applicable).

## 2015-06-01 NOTE — Progress Notes (Signed)
Electrophysiology Office Note Date: 06/01/2015  ID:  Cynthia, Walls 05-05-1945, MRN [id number]  PCP: Walker Kehr, MD Electrophysiologist: Caryl Comes  CC: follow up for atrial tachycardia  [name] is a 70 y.o. female seen today for Dr Caryl Comes.  She presents today for routine electrophysiology followup.  Since last being seen in our clinic, the patient reports doing reasonably well.  She denies chest pain, palpitations, dyspnea, PND, orthopnea, nausea, vomiting, dizziness, syncope, edema, weight gain, or early satiety. She has not had bleeding issues.   Echo 10/2014 demonstrated EF 35-40%, moderate LVH, mild AR, PA pressure 56  Past Medical History  Diagnosis Date  . Hyperlipidemia   . Mitral valve insufficiency and aortic valve insufficiency   . Cardiomyopathy- mixed   . 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 Chronic diastolic heart failure ([place])   . Anemia   . Coronary artery disease     Previously decreased EF; echo 113 normal LV function  . Hernia, incisional     abd  . History of colon cancer     history of colon cancer.  1986  . Arthritis   . LBBB (left bundle branch block)   . Atrial tachycardia ([place])     on amiod  . Hypertensive heart disease     sees Dr. Alain Marion  . Stroke [facility])     2011/12  . Obesity   . Hypertension   . Glaucoma   . Renal failure     ESRD, Dr Dunham/Dr. Lyda Kalata.  M, W, Fr  . GERD (gastroesophageal reflux disease)   . Cancer [facility])     colon cancer  . Colostomy in place [facility])    Past Surgical History  Procedure Laterality Date  . Colostomy  1986  . Revision urostomy cutaneous    . Esophagogastroduodenoscopy  03-18-04  . Electrocardiogram  04-27-06  . Placement of new left forearm arteriovenous graft  03-11-08  . Left heart catheterization and right heart catheterization  12-10    R. heart cath showed elevated left and right heart filling pressures w/ pulmonary artery  pressure elevated mildly out of proportion to the wedge. The left heart cath showed diffuse distal vessel disease as well as a 75% stenosis in the mid circumflex w/ a 90% stenosis of the ostial first obtuse marginal. These lesions were in close proximity. there was a 60-70%mild RCA stenosis.   . Arteriovenous graft placement  2010  . 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 Cardiac catheterization    . Eye surgery      Cataract Left  . Pars plana vitrectomy  11/29/2011    Procedure: PARS PLANA VITRECTOMY WITH 23 GAUGE;  Surgeon: Adonis Brook, MD;  Location: [place];  Service: Ophthalmology;  Laterality: Right;  Right Eye 23 ga vitrectomy with membrane peel  . Pars plana vitrectomy Left 02/28/2012    Procedure: PARS PLANA VITRECTOMY WITH 23 GAUGE;  Surgeon: Adonis Brook, MD;  Location: [place];  Service: Ophthalmology;  Laterality: Left;  . Esophagogastroduodenoscopy N/A 02/02/2013    Procedure: ESOPHAGOGASTRODUODENOSCOPY (EGD);  Surgeon: Irene Shipper, MD;  Location: [facility] ENDOSCOPY;  Service: Endoscopy;  Laterality: N/A;  . Vaginal hysterectomy    . Colonoscopy    . Insertion of ahmed valve Right 08/20/2013    Procedure: INSERTION OF AHMED VALVE WITH Mitomycin C application;  Surgeon: Marylynn Pearson, MD;  Location: [place];  Service: Ophthalmology;  Laterality: Right;  . Insertion of ahmed valve Left 07/22/2014    Procedure: INSERTION OF AHMED VALVE WITH [place];  Surgeon: Marylynn Pearson, MD;  Location: [place];  Service: Ophthalmology;  Laterality: Left;  [name] Mitomycin c application Left [id number]    Procedure: MITOMYCIN C APPLICATION;  Surgeon: Marylynn Pearson, MD;  Location: [place];  Service: Ophthalmology;  Laterality: Left;  . Cardiac catheterization N/A 10/27/2014    Procedure: Left Heart Cath and Coronary Angiography;  Surgeon: Leonie Man, MD;  Location: [place] CV LAB;  Service: Cardiovascular;  Laterality: N/A;  . Cardiac catheterization N/A 10/28/2014     Procedure: Coronary Stent Intervention;  Surgeon: Peter M Martinique, MD;  Location: [place] CV LAB;  Service: Cardiovascular;  Laterality: N/A;    Current Outpatient Prescriptions  Medication Sig Dispense Refill  . acetaminophen (TYLENOL) 500 MG tablet Take 500 mg by mouth every 6 (six) hours as needed (pain).    [name] amiodarone (PACERONE) 200 MG tablet Take 0.5 tablets (100 mg total) by mouth daily. 45 tablet 1  . aspirin 81 MG tablet Take 1 tablet (81 mg total) by mouth daily. 30 tablet 3  . BRILINTA 90 MG TABS tablet TAKE 1 TABLET BY MOUTH TWICE DAILY 60 tablet 9  . calcium acetate (PHOSLO) 667 MG capsule Take 1,334-2,001 mg by mouth See admin instructions. Take 2001mg  three times daily with meals and Take 1334mg   with snacks.    . dorzolamide-timolol (COSOPT) 22.3-6.8 MG/ML ophthalmic solution Place 1 drop into the left eye 4 (four) times daily.  12  . Fluticasone-Salmeterol (ADVAIR) 250-50 MCG/DOSE AEPB Inhale 1 puff into the lungs daily as needed (shortness of breath).     [name] glucose blood (ONE TOUCH ULTRA TEST) test strip Check blood sugar 2 time per day. DX code E11.9 200 each 2  . metoprolol (LOPRESSOR) 50 MG tablet Take 1 tablet (50 mg total) by mouth 2 (two) times daily. Do not take this medication on dialysis days (Monday, Wednesday, Friday). 60 tablet 0  . nitroGLYCERIN (NITROSTAT) 0.4 MG SL tablet Place 1 tablet (0.4 mg total) under the tongue every 5 (five) minutes as needed for chest pain. 30 tablet 3  . prednisoLONE acetate (PRED FORTE) 1 % ophthalmic suspension Place 1 drop into the left eye 4 (four) times daily.    . repaglinide (PRANDIN) 0.5 MG tablet Take 1 tablet (0.5 mg total) by mouth 3 (three) times daily before meals. 90 tablet 11  . simvastatin (ZOCOR) 10 MG tablet Take 1 tablet (10 mg total) by mouth every evening. 90 tablet 3   No current facility-administered medications for this visit.   Facility-Administered Medications Ordered in Other Visits  Medication Dose Route  Frequency Provider Last Rate Last Dose  . mitoMYcin (MUTAMYCIN) Injection Use in OR only (0.4 mg/ml)  0.5 mL Left Eye Once Marylynn Pearson, MD        Allergies:   Ace inhibitors; Eggs or egg-derived products; Enalapril; Lisinopril; and Omnipaque   Social History: Social History   Social History  . Marital Status: Single    Spouse Name: N/A  . Number of Children: N/A  . Years of Education: N/A   Occupational History  . Not on file.   Social History Main Topics  . Smoking status: Never Smoker   . Smokeless tobacco: Never Used  . Alcohol Use: No  . Drug Use: No  . Sexual Activity: No   Other Topics Concern  . Not on file  Social History Narrative    Family History: Family History  Problem Relation Age of Onset  . Cancer Sister     colon  . Hypertension Other   . Hypertension Mother   . Coronary artery disease Mother   . Hypertension Father   . Diabetes Father     Review of Systems: All other systems reviewed and are otherwise negative except as noted above.   Physical Exam: VS:  BP 120/68 mmHg  Pulse 100  Ht 5' (1.524 m)  Wt 194 lb (87.998 kg)  BMI 37.89 kg/m2 , BMI Body mass index is 37.89 kg/(m^2). Wt Readings from Last 3 Encounters:  06/01/15 194 lb (87.998 kg)  05/13/15 195 lb 2 oz (88.508 kg)  02/09/15 194 lb (87.998 kg)    GEN- The patient is elderly and obese appearing, alert and oriented x 3 today.   HEENT: normocephalic, atraumatic; sclera clear, conjunctiva pink; hearing intact; oropharynx clear; neck supple  Lungs- Clear to ausculation bilaterally, normal work of breathing.  No wheezes, rales, rhonchi Heart- Regular rate and rhythm, no murmurs, rubs or gallops  GI- soft, non-tender, non-distended, bowel sounds present  Extremities- no clubbing, cyanosis, or edema; DP/PT/radial pulses 1+ bilaterally, AV fistula LUE +thrill MS- no significant deformity or atrophy Skin- warm and dry, no rash or lesion  Psych- euthymic mood, full affect Neuro-  strength and sensation are intact   EKG:  EKG is ordered today. EKG today shows sinus rhythm with 1st degree AV block, LBBB  Recent Labs: 10/23/2014: B Natriuretic Peptide 1300.0* 10/24/2014: TSH 0.830 10/29/2014: BUN 32*; Creatinine, Ser 5.16*; Hemoglobin 10.7*; Platelets 330; Potassium 4.6; Sodium 133*    Other studies Reviewed: Additional studies/ records that were reviewed today include: Dr Olin Pia office notes  Assessment and Plan: 1.  Atrial tachycardia Maintaining SR on amiodarone TSH, LFT's with HD tomorrow.  Annual eye exams recommended EKG stable  2.  CAD s/p DES to RCA 10/2014 Plan DAPT for 1 year per cath note No bleeding issues CBC tomorrow at HD  3.  ICM/Chronic systolic heart failure Euvolemic on exam Continue medical therapy Update echo before next visit with Dr Caryl Comes in October   4.  HTN Stable No change required today   Current medicines are reviewed at length with the patient today.   The patient does not have concerns regarding her medicines.  The following changes were made today:  none  Labs/ tests ordered today include: CBC, TSH, LFT's   Disposition:   Follow up with Dr Caryl Comes in 6 months     Signed, Chanetta Marshall, NP 06/01/2015 10:34 AM   [place] [address] [phone] (office) [phone] (fax)

## 2015-06-02 DIAGNOSIS — N2581 Secondary hyperparathyroidism of renal origin: Secondary | ICD-10-CM | POA: Diagnosis not present

## 2015-06-02 DIAGNOSIS — D631 Anemia in chronic kidney disease: Secondary | ICD-10-CM | POA: Diagnosis not present

## 2015-06-02 DIAGNOSIS — N186 End stage renal disease: Secondary | ICD-10-CM | POA: Diagnosis not present

## 2015-06-02 DIAGNOSIS — E119 Type 2 diabetes mellitus without complications: Secondary | ICD-10-CM | POA: Diagnosis not present

## 2015-06-02 DIAGNOSIS — D509 Iron deficiency anemia, unspecified: Secondary | ICD-10-CM | POA: Diagnosis not present

## 2015-06-04 DIAGNOSIS — E119 Type 2 diabetes mellitus without complications: Secondary | ICD-10-CM | POA: Diagnosis not present

## 2015-06-04 DIAGNOSIS — N186 End stage renal disease: Secondary | ICD-10-CM | POA: Diagnosis not present

## 2015-06-04 DIAGNOSIS — D631 Anemia in chronic kidney disease: Secondary | ICD-10-CM | POA: Diagnosis not present

## 2015-06-04 DIAGNOSIS — D509 Iron deficiency anemia, unspecified: Secondary | ICD-10-CM | POA: Diagnosis not present

## 2015-06-04 DIAGNOSIS — N2581 Secondary hyperparathyroidism of renal origin: Secondary | ICD-10-CM | POA: Diagnosis not present

## 2015-06-07 DIAGNOSIS — N2581 Secondary hyperparathyroidism of renal origin: Secondary | ICD-10-CM | POA: Diagnosis not present

## 2015-06-07 DIAGNOSIS — E119 Type 2 diabetes mellitus without complications: Secondary | ICD-10-CM | POA: Diagnosis not present

## 2015-06-07 DIAGNOSIS — N186 End stage renal disease: Secondary | ICD-10-CM | POA: Diagnosis not present

## 2015-06-07 DIAGNOSIS — D631 Anemia in chronic kidney disease: Secondary | ICD-10-CM | POA: Diagnosis not present

## 2015-06-07 DIAGNOSIS — D509 Iron deficiency anemia, unspecified: Secondary | ICD-10-CM | POA: Diagnosis not present

## 2015-06-08 ENCOUNTER — Encounter: Payer: Self-pay | Admitting: Internal Medicine

## 2015-06-09 DIAGNOSIS — E119 Type 2 diabetes mellitus without complications: Secondary | ICD-10-CM | POA: Diagnosis not present

## 2015-06-09 DIAGNOSIS — D631 Anemia in chronic kidney disease: Secondary | ICD-10-CM | POA: Diagnosis not present

## 2015-06-09 DIAGNOSIS — N2581 Secondary hyperparathyroidism of renal origin: Secondary | ICD-10-CM | POA: Diagnosis not present

## 2015-06-09 DIAGNOSIS — Z992 Dependence on renal dialysis: Secondary | ICD-10-CM | POA: Diagnosis not present

## 2015-06-09 DIAGNOSIS — E1129 Type 2 diabetes mellitus with other diabetic kidney complication: Secondary | ICD-10-CM | POA: Diagnosis not present

## 2015-06-09 DIAGNOSIS — N186 End stage renal disease: Secondary | ICD-10-CM | POA: Diagnosis not present

## 2015-06-09 DIAGNOSIS — D509 Iron deficiency anemia, unspecified: Secondary | ICD-10-CM | POA: Diagnosis not present

## 2015-06-11 DIAGNOSIS — N2581 Secondary hyperparathyroidism of renal origin: Secondary | ICD-10-CM | POA: Diagnosis not present

## 2015-06-11 DIAGNOSIS — Z87440 Personal history of urinary (tract) infections: Secondary | ICD-10-CM | POA: Diagnosis not present

## 2015-06-11 DIAGNOSIS — D631 Anemia in chronic kidney disease: Secondary | ICD-10-CM | POA: Diagnosis not present

## 2015-06-11 DIAGNOSIS — E119 Type 2 diabetes mellitus without complications: Secondary | ICD-10-CM | POA: Diagnosis not present

## 2015-06-11 DIAGNOSIS — N390 Urinary tract infection, site not specified: Secondary | ICD-10-CM | POA: Diagnosis not present

## 2015-06-11 DIAGNOSIS — N186 End stage renal disease: Secondary | ICD-10-CM | POA: Diagnosis not present

## 2015-06-11 DIAGNOSIS — D509 Iron deficiency anemia, unspecified: Secondary | ICD-10-CM | POA: Diagnosis not present

## 2015-06-14 DIAGNOSIS — D509 Iron deficiency anemia, unspecified: Secondary | ICD-10-CM | POA: Diagnosis not present

## 2015-06-14 DIAGNOSIS — N186 End stage renal disease: Secondary | ICD-10-CM | POA: Diagnosis not present

## 2015-06-14 DIAGNOSIS — N390 Urinary tract infection, site not specified: Secondary | ICD-10-CM | POA: Diagnosis not present

## 2015-06-14 DIAGNOSIS — E119 Type 2 diabetes mellitus without complications: Secondary | ICD-10-CM | POA: Diagnosis not present

## 2015-06-14 DIAGNOSIS — N2581 Secondary hyperparathyroidism of renal origin: Secondary | ICD-10-CM | POA: Diagnosis not present

## 2015-06-14 DIAGNOSIS — D631 Anemia in chronic kidney disease: Secondary | ICD-10-CM | POA: Diagnosis not present

## 2015-06-16 DIAGNOSIS — D631 Anemia in chronic kidney disease: Secondary | ICD-10-CM | POA: Diagnosis not present

## 2015-06-16 DIAGNOSIS — N186 End stage renal disease: Secondary | ICD-10-CM | POA: Diagnosis not present

## 2015-06-16 DIAGNOSIS — N390 Urinary tract infection, site not specified: Secondary | ICD-10-CM | POA: Diagnosis not present

## 2015-06-16 DIAGNOSIS — D509 Iron deficiency anemia, unspecified: Secondary | ICD-10-CM | POA: Diagnosis not present

## 2015-06-16 DIAGNOSIS — N2581 Secondary hyperparathyroidism of renal origin: Secondary | ICD-10-CM | POA: Diagnosis not present

## 2015-06-16 DIAGNOSIS — E119 Type 2 diabetes mellitus without complications: Secondary | ICD-10-CM | POA: Diagnosis not present

## 2015-06-18 DIAGNOSIS — N2581 Secondary hyperparathyroidism of renal origin: Secondary | ICD-10-CM | POA: Diagnosis not present

## 2015-06-18 DIAGNOSIS — D509 Iron deficiency anemia, unspecified: Secondary | ICD-10-CM | POA: Diagnosis not present

## 2015-06-18 DIAGNOSIS — N390 Urinary tract infection, site not specified: Secondary | ICD-10-CM | POA: Diagnosis not present

## 2015-06-18 DIAGNOSIS — D631 Anemia in chronic kidney disease: Secondary | ICD-10-CM | POA: Diagnosis not present

## 2015-06-18 DIAGNOSIS — N186 End stage renal disease: Secondary | ICD-10-CM | POA: Diagnosis not present

## 2015-06-18 DIAGNOSIS — E119 Type 2 diabetes mellitus without complications: Secondary | ICD-10-CM | POA: Diagnosis not present

## 2015-06-21 DIAGNOSIS — D509 Iron deficiency anemia, unspecified: Secondary | ICD-10-CM | POA: Diagnosis not present

## 2015-06-21 DIAGNOSIS — N2581 Secondary hyperparathyroidism of renal origin: Secondary | ICD-10-CM | POA: Diagnosis not present

## 2015-06-21 DIAGNOSIS — N390 Urinary tract infection, site not specified: Secondary | ICD-10-CM | POA: Diagnosis not present

## 2015-06-21 DIAGNOSIS — N186 End stage renal disease: Secondary | ICD-10-CM | POA: Diagnosis not present

## 2015-06-21 DIAGNOSIS — E119 Type 2 diabetes mellitus without complications: Secondary | ICD-10-CM | POA: Diagnosis not present

## 2015-06-21 DIAGNOSIS — D631 Anemia in chronic kidney disease: Secondary | ICD-10-CM | POA: Diagnosis not present

## 2015-06-22 ENCOUNTER — Other Ambulatory Visit (INDEPENDENT_AMBULATORY_CARE_PROVIDER_SITE_OTHER): Payer: Medicare Other | Admitting: Endocrinology

## 2015-06-22 DIAGNOSIS — N186 End stage renal disease: Secondary | ICD-10-CM | POA: Diagnosis not present

## 2015-06-22 DIAGNOSIS — Z992 Dependence on renal dialysis: Secondary | ICD-10-CM

## 2015-06-22 DIAGNOSIS — E1122 Type 2 diabetes mellitus with diabetic chronic kidney disease: Secondary | ICD-10-CM | POA: Diagnosis not present

## 2015-06-23 DIAGNOSIS — N186 End stage renal disease: Secondary | ICD-10-CM | POA: Diagnosis not present

## 2015-06-23 DIAGNOSIS — D631 Anemia in chronic kidney disease: Secondary | ICD-10-CM | POA: Diagnosis not present

## 2015-06-23 DIAGNOSIS — N390 Urinary tract infection, site not specified: Secondary | ICD-10-CM | POA: Diagnosis not present

## 2015-06-23 DIAGNOSIS — E119 Type 2 diabetes mellitus without complications: Secondary | ICD-10-CM | POA: Diagnosis not present

## 2015-06-23 DIAGNOSIS — D509 Iron deficiency anemia, unspecified: Secondary | ICD-10-CM | POA: Diagnosis not present

## 2015-06-23 DIAGNOSIS — N2581 Secondary hyperparathyroidism of renal origin: Secondary | ICD-10-CM | POA: Diagnosis not present

## 2015-06-25 DIAGNOSIS — D631 Anemia in chronic kidney disease: Secondary | ICD-10-CM | POA: Diagnosis not present

## 2015-06-25 DIAGNOSIS — E119 Type 2 diabetes mellitus without complications: Secondary | ICD-10-CM | POA: Diagnosis not present

## 2015-06-25 DIAGNOSIS — N2581 Secondary hyperparathyroidism of renal origin: Secondary | ICD-10-CM | POA: Diagnosis not present

## 2015-06-25 DIAGNOSIS — Z87440 Personal history of urinary (tract) infections: Secondary | ICD-10-CM | POA: Diagnosis not present

## 2015-06-25 DIAGNOSIS — N390 Urinary tract infection, site not specified: Secondary | ICD-10-CM | POA: Diagnosis not present

## 2015-06-25 DIAGNOSIS — N186 End stage renal disease: Secondary | ICD-10-CM | POA: Diagnosis not present

## 2015-06-25 DIAGNOSIS — D509 Iron deficiency anemia, unspecified: Secondary | ICD-10-CM | POA: Diagnosis not present

## 2015-06-28 DIAGNOSIS — D509 Iron deficiency anemia, unspecified: Secondary | ICD-10-CM | POA: Diagnosis not present

## 2015-06-28 DIAGNOSIS — D631 Anemia in chronic kidney disease: Secondary | ICD-10-CM | POA: Diagnosis not present

## 2015-06-28 DIAGNOSIS — E119 Type 2 diabetes mellitus without complications: Secondary | ICD-10-CM | POA: Diagnosis not present

## 2015-06-28 DIAGNOSIS — N2581 Secondary hyperparathyroidism of renal origin: Secondary | ICD-10-CM | POA: Diagnosis not present

## 2015-06-28 DIAGNOSIS — N390 Urinary tract infection, site not specified: Secondary | ICD-10-CM | POA: Diagnosis not present

## 2015-06-28 DIAGNOSIS — N186 End stage renal disease: Secondary | ICD-10-CM | POA: Diagnosis not present

## 2015-06-30 DIAGNOSIS — D509 Iron deficiency anemia, unspecified: Secondary | ICD-10-CM | POA: Diagnosis not present

## 2015-06-30 DIAGNOSIS — E119 Type 2 diabetes mellitus without complications: Secondary | ICD-10-CM | POA: Diagnosis not present

## 2015-06-30 DIAGNOSIS — N390 Urinary tract infection, site not specified: Secondary | ICD-10-CM | POA: Diagnosis not present

## 2015-06-30 DIAGNOSIS — N186 End stage renal disease: Secondary | ICD-10-CM | POA: Diagnosis not present

## 2015-06-30 DIAGNOSIS — D631 Anemia in chronic kidney disease: Secondary | ICD-10-CM | POA: Diagnosis not present

## 2015-06-30 DIAGNOSIS — N2581 Secondary hyperparathyroidism of renal origin: Secondary | ICD-10-CM | POA: Diagnosis not present

## 2015-07-02 DIAGNOSIS — N390 Urinary tract infection, site not specified: Secondary | ICD-10-CM | POA: Diagnosis not present

## 2015-07-02 DIAGNOSIS — N2581 Secondary hyperparathyroidism of renal origin: Secondary | ICD-10-CM | POA: Diagnosis not present

## 2015-07-02 DIAGNOSIS — N186 End stage renal disease: Secondary | ICD-10-CM | POA: Diagnosis not present

## 2015-07-02 DIAGNOSIS — D509 Iron deficiency anemia, unspecified: Secondary | ICD-10-CM | POA: Diagnosis not present

## 2015-07-02 DIAGNOSIS — D631 Anemia in chronic kidney disease: Secondary | ICD-10-CM | POA: Diagnosis not present

## 2015-07-02 DIAGNOSIS — E119 Type 2 diabetes mellitus without complications: Secondary | ICD-10-CM | POA: Diagnosis not present

## 2015-07-05 DIAGNOSIS — D509 Iron deficiency anemia, unspecified: Secondary | ICD-10-CM | POA: Diagnosis not present

## 2015-07-05 DIAGNOSIS — N2581 Secondary hyperparathyroidism of renal origin: Secondary | ICD-10-CM | POA: Diagnosis not present

## 2015-07-05 DIAGNOSIS — E119 Type 2 diabetes mellitus without complications: Secondary | ICD-10-CM | POA: Diagnosis not present

## 2015-07-05 DIAGNOSIS — D631 Anemia in chronic kidney disease: Secondary | ICD-10-CM | POA: Diagnosis not present

## 2015-07-05 DIAGNOSIS — N186 End stage renal disease: Secondary | ICD-10-CM | POA: Diagnosis not present

## 2015-07-05 DIAGNOSIS — N390 Urinary tract infection, site not specified: Secondary | ICD-10-CM | POA: Diagnosis not present

## 2015-07-07 DIAGNOSIS — N2581 Secondary hyperparathyroidism of renal origin: Secondary | ICD-10-CM | POA: Diagnosis not present

## 2015-07-07 DIAGNOSIS — E119 Type 2 diabetes mellitus without complications: Secondary | ICD-10-CM | POA: Diagnosis not present

## 2015-07-07 DIAGNOSIS — N390 Urinary tract infection, site not specified: Secondary | ICD-10-CM | POA: Diagnosis not present

## 2015-07-07 DIAGNOSIS — N186 End stage renal disease: Secondary | ICD-10-CM | POA: Diagnosis not present

## 2015-07-07 DIAGNOSIS — D631 Anemia in chronic kidney disease: Secondary | ICD-10-CM | POA: Diagnosis not present

## 2015-07-07 DIAGNOSIS — D509 Iron deficiency anemia, unspecified: Secondary | ICD-10-CM | POA: Diagnosis not present

## 2015-07-09 DIAGNOSIS — D509 Iron deficiency anemia, unspecified: Secondary | ICD-10-CM | POA: Diagnosis not present

## 2015-07-09 DIAGNOSIS — Z992 Dependence on renal dialysis: Secondary | ICD-10-CM | POA: Diagnosis not present

## 2015-07-09 DIAGNOSIS — E1129 Type 2 diabetes mellitus with other diabetic kidney complication: Secondary | ICD-10-CM | POA: Diagnosis not present

## 2015-07-09 DIAGNOSIS — N186 End stage renal disease: Secondary | ICD-10-CM | POA: Diagnosis not present

## 2015-07-09 DIAGNOSIS — E119 Type 2 diabetes mellitus without complications: Secondary | ICD-10-CM | POA: Diagnosis not present

## 2015-07-09 DIAGNOSIS — N390 Urinary tract infection, site not specified: Secondary | ICD-10-CM | POA: Diagnosis not present

## 2015-07-09 DIAGNOSIS — D631 Anemia in chronic kidney disease: Secondary | ICD-10-CM | POA: Diagnosis not present

## 2015-07-09 DIAGNOSIS — N2581 Secondary hyperparathyroidism of renal origin: Secondary | ICD-10-CM | POA: Diagnosis not present

## 2015-07-12 DIAGNOSIS — E119 Type 2 diabetes mellitus without complications: Secondary | ICD-10-CM | POA: Diagnosis not present

## 2015-07-12 DIAGNOSIS — D509 Iron deficiency anemia, unspecified: Secondary | ICD-10-CM | POA: Diagnosis not present

## 2015-07-12 DIAGNOSIS — N2581 Secondary hyperparathyroidism of renal origin: Secondary | ICD-10-CM | POA: Diagnosis not present

## 2015-07-12 DIAGNOSIS — N186 End stage renal disease: Secondary | ICD-10-CM | POA: Diagnosis not present

## 2015-07-12 DIAGNOSIS — D631 Anemia in chronic kidney disease: Secondary | ICD-10-CM | POA: Diagnosis not present

## 2015-07-14 DIAGNOSIS — D631 Anemia in chronic kidney disease: Secondary | ICD-10-CM | POA: Diagnosis not present

## 2015-07-14 DIAGNOSIS — D509 Iron deficiency anemia, unspecified: Secondary | ICD-10-CM | POA: Diagnosis not present

## 2015-07-14 DIAGNOSIS — N186 End stage renal disease: Secondary | ICD-10-CM | POA: Diagnosis not present

## 2015-07-14 DIAGNOSIS — E119 Type 2 diabetes mellitus without complications: Secondary | ICD-10-CM | POA: Diagnosis not present

## 2015-07-14 DIAGNOSIS — N2581 Secondary hyperparathyroidism of renal origin: Secondary | ICD-10-CM | POA: Diagnosis not present

## 2015-07-15 ENCOUNTER — Telehealth: Payer: Self-pay | Admitting: Internal Medicine

## 2015-07-15 DIAGNOSIS — R0602 Shortness of breath: Secondary | ICD-10-CM

## 2015-07-15 NOTE — Telephone Encounter (Signed)
I called and spoke with the patient.  She is reporting SOB with exertion (primarily on HD mornings) for the last 2 weeks. She states that she exerts herself getting ready for HD and then getting there, some with walking not related to HD days. She has had to start wearing oxygen for a while when she gets to dialysis.  This is done on M/W/F- the patient reports that yesterday, then renal MD, saw her on the O2 and they extended her HD by 15 minutes. Dry weight in the AM is now 85 kg instead of 85.5 kg. She feels as though she is managing her water weight very well. She is scheduled for a repeat echo in September. Last echo was 10/16- EF- 35-40%. I advised her I will review with Dr. Caryl Comes and call her back. She is agreeable.

## 2015-07-15 NOTE — Telephone Encounter (Signed)
Lets do echo and then arrange follwoup with Richardson Dopp / Me to review

## 2015-07-15 NOTE — Telephone Encounter (Signed)
New message    Pt c/o Shortness Of Breath: STAT if SOB developed within the last 24 hours or pt is noticeably SOB on the phone  1. Are you currently SOB (can you hear that pt is SOB on the phone)? no 2. How long have you been experiencing SOB? 2 weeks  3. Are you SOB when sitting or when up moving around? Up moving around  4. Are you currently experiencing any other symptoms? no

## 2015-07-16 DIAGNOSIS — D509 Iron deficiency anemia, unspecified: Secondary | ICD-10-CM | POA: Diagnosis not present

## 2015-07-16 DIAGNOSIS — D631 Anemia in chronic kidney disease: Secondary | ICD-10-CM | POA: Diagnosis not present

## 2015-07-16 DIAGNOSIS — E119 Type 2 diabetes mellitus without complications: Secondary | ICD-10-CM | POA: Diagnosis not present

## 2015-07-16 DIAGNOSIS — N186 End stage renal disease: Secondary | ICD-10-CM | POA: Diagnosis not present

## 2015-07-16 DIAGNOSIS — N2581 Secondary hyperparathyroidism of renal origin: Secondary | ICD-10-CM | POA: Diagnosis not present

## 2015-07-16 NOTE — Telephone Encounter (Signed)
F/u Message  Pt returning Rn call. Pt ask if RN could return call after 8:30am. Please call back to discuss

## 2015-07-16 NOTE — Telephone Encounter (Signed)
Called patient, voicemail was full, so unable to leave message. Will try again later.

## 2015-07-16 NOTE — Telephone Encounter (Signed)
Moved patient's echo appointment from September to July (08/03/15). Patient will follow-up with Richardson Dopp PA after echo appointment on 08/10/15. With patient's schedule and limited appointments, we agreed on these dates. Patient verbalized understanding.

## 2015-07-19 DIAGNOSIS — N186 End stage renal disease: Secondary | ICD-10-CM | POA: Diagnosis not present

## 2015-07-19 DIAGNOSIS — N2581 Secondary hyperparathyroidism of renal origin: Secondary | ICD-10-CM | POA: Diagnosis not present

## 2015-07-19 DIAGNOSIS — E119 Type 2 diabetes mellitus without complications: Secondary | ICD-10-CM | POA: Diagnosis not present

## 2015-07-19 DIAGNOSIS — D631 Anemia in chronic kidney disease: Secondary | ICD-10-CM | POA: Diagnosis not present

## 2015-07-19 DIAGNOSIS — D509 Iron deficiency anemia, unspecified: Secondary | ICD-10-CM | POA: Diagnosis not present

## 2015-07-21 DIAGNOSIS — E119 Type 2 diabetes mellitus without complications: Secondary | ICD-10-CM | POA: Diagnosis not present

## 2015-07-21 DIAGNOSIS — N2581 Secondary hyperparathyroidism of renal origin: Secondary | ICD-10-CM | POA: Diagnosis not present

## 2015-07-21 DIAGNOSIS — D509 Iron deficiency anemia, unspecified: Secondary | ICD-10-CM | POA: Diagnosis not present

## 2015-07-21 DIAGNOSIS — D631 Anemia in chronic kidney disease: Secondary | ICD-10-CM | POA: Diagnosis not present

## 2015-07-21 DIAGNOSIS — N186 End stage renal disease: Secondary | ICD-10-CM | POA: Diagnosis not present

## 2015-07-23 DIAGNOSIS — N2581 Secondary hyperparathyroidism of renal origin: Secondary | ICD-10-CM | POA: Diagnosis not present

## 2015-07-23 DIAGNOSIS — E119 Type 2 diabetes mellitus without complications: Secondary | ICD-10-CM | POA: Diagnosis not present

## 2015-07-23 DIAGNOSIS — D509 Iron deficiency anemia, unspecified: Secondary | ICD-10-CM | POA: Diagnosis not present

## 2015-07-23 DIAGNOSIS — N186 End stage renal disease: Secondary | ICD-10-CM | POA: Diagnosis not present

## 2015-07-23 DIAGNOSIS — D631 Anemia in chronic kidney disease: Secondary | ICD-10-CM | POA: Diagnosis not present

## 2015-07-26 DIAGNOSIS — D509 Iron deficiency anemia, unspecified: Secondary | ICD-10-CM | POA: Diagnosis not present

## 2015-07-26 DIAGNOSIS — N186 End stage renal disease: Secondary | ICD-10-CM | POA: Diagnosis not present

## 2015-07-26 DIAGNOSIS — N2581 Secondary hyperparathyroidism of renal origin: Secondary | ICD-10-CM | POA: Diagnosis not present

## 2015-07-26 DIAGNOSIS — D631 Anemia in chronic kidney disease: Secondary | ICD-10-CM | POA: Diagnosis not present

## 2015-07-26 DIAGNOSIS — E119 Type 2 diabetes mellitus without complications: Secondary | ICD-10-CM | POA: Diagnosis not present

## 2015-07-28 DIAGNOSIS — N2581 Secondary hyperparathyroidism of renal origin: Secondary | ICD-10-CM | POA: Diagnosis not present

## 2015-07-28 DIAGNOSIS — E119 Type 2 diabetes mellitus without complications: Secondary | ICD-10-CM | POA: Diagnosis not present

## 2015-07-28 DIAGNOSIS — D509 Iron deficiency anemia, unspecified: Secondary | ICD-10-CM | POA: Diagnosis not present

## 2015-07-28 DIAGNOSIS — D631 Anemia in chronic kidney disease: Secondary | ICD-10-CM | POA: Diagnosis not present

## 2015-07-28 DIAGNOSIS — N186 End stage renal disease: Secondary | ICD-10-CM | POA: Diagnosis not present

## 2015-07-30 DIAGNOSIS — N2581 Secondary hyperparathyroidism of renal origin: Secondary | ICD-10-CM | POA: Diagnosis not present

## 2015-07-30 DIAGNOSIS — N186 End stage renal disease: Secondary | ICD-10-CM | POA: Diagnosis not present

## 2015-07-30 DIAGNOSIS — E119 Type 2 diabetes mellitus without complications: Secondary | ICD-10-CM | POA: Diagnosis not present

## 2015-07-30 DIAGNOSIS — D631 Anemia in chronic kidney disease: Secondary | ICD-10-CM | POA: Diagnosis not present

## 2015-07-30 DIAGNOSIS — D509 Iron deficiency anemia, unspecified: Secondary | ICD-10-CM | POA: Diagnosis not present

## 2015-08-02 DIAGNOSIS — N2581 Secondary hyperparathyroidism of renal origin: Secondary | ICD-10-CM | POA: Diagnosis not present

## 2015-08-02 DIAGNOSIS — E119 Type 2 diabetes mellitus without complications: Secondary | ICD-10-CM | POA: Diagnosis not present

## 2015-08-02 DIAGNOSIS — D509 Iron deficiency anemia, unspecified: Secondary | ICD-10-CM | POA: Diagnosis not present

## 2015-08-02 DIAGNOSIS — D631 Anemia in chronic kidney disease: Secondary | ICD-10-CM | POA: Diagnosis not present

## 2015-08-02 DIAGNOSIS — N186 End stage renal disease: Secondary | ICD-10-CM | POA: Diagnosis not present

## 2015-08-03 ENCOUNTER — Ambulatory Visit (HOSPITAL_COMMUNITY): Payer: Medicare Other | Attending: Cardiology

## 2015-08-03 ENCOUNTER — Encounter (INDEPENDENT_AMBULATORY_CARE_PROVIDER_SITE_OTHER): Payer: Self-pay

## 2015-08-03 ENCOUNTER — Other Ambulatory Visit (HOSPITAL_COMMUNITY): Payer: Self-pay

## 2015-08-03 DIAGNOSIS — Z6837 Body mass index (BMI) 37.0-37.9, adult: Secondary | ICD-10-CM | POA: Insufficient documentation

## 2015-08-03 DIAGNOSIS — I340 Nonrheumatic mitral (valve) insufficiency: Secondary | ICD-10-CM | POA: Diagnosis not present

## 2015-08-03 DIAGNOSIS — I110 Hypertensive heart disease with heart failure: Secondary | ICD-10-CM | POA: Insufficient documentation

## 2015-08-03 DIAGNOSIS — I447 Left bundle-branch block, unspecified: Secondary | ICD-10-CM | POA: Insufficient documentation

## 2015-08-03 DIAGNOSIS — E119 Type 2 diabetes mellitus without complications: Secondary | ICD-10-CM | POA: Insufficient documentation

## 2015-08-03 DIAGNOSIS — E785 Hyperlipidemia, unspecified: Secondary | ICD-10-CM | POA: Insufficient documentation

## 2015-08-03 DIAGNOSIS — I429 Cardiomyopathy, unspecified: Secondary | ICD-10-CM | POA: Diagnosis present

## 2015-08-03 DIAGNOSIS — I2510 Atherosclerotic heart disease of native coronary artery without angina pectoris: Secondary | ICD-10-CM | POA: Insufficient documentation

## 2015-08-03 DIAGNOSIS — I5032 Chronic diastolic (congestive) heart failure: Secondary | ICD-10-CM | POA: Insufficient documentation

## 2015-08-03 DIAGNOSIS — I255 Ischemic cardiomyopathy: Secondary | ICD-10-CM | POA: Diagnosis not present

## 2015-08-03 DIAGNOSIS — I071 Rheumatic tricuspid insufficiency: Secondary | ICD-10-CM | POA: Insufficient documentation

## 2015-08-03 DIAGNOSIS — E669 Obesity, unspecified: Secondary | ICD-10-CM | POA: Insufficient documentation

## 2015-08-03 DIAGNOSIS — I351 Nonrheumatic aortic (valve) insufficiency: Secondary | ICD-10-CM | POA: Diagnosis not present

## 2015-08-04 DIAGNOSIS — D509 Iron deficiency anemia, unspecified: Secondary | ICD-10-CM | POA: Diagnosis not present

## 2015-08-04 DIAGNOSIS — E119 Type 2 diabetes mellitus without complications: Secondary | ICD-10-CM | POA: Diagnosis not present

## 2015-08-04 DIAGNOSIS — D631 Anemia in chronic kidney disease: Secondary | ICD-10-CM | POA: Diagnosis not present

## 2015-08-04 DIAGNOSIS — E1129 Type 2 diabetes mellitus with other diabetic kidney complication: Secondary | ICD-10-CM | POA: Diagnosis not present

## 2015-08-04 DIAGNOSIS — N2581 Secondary hyperparathyroidism of renal origin: Secondary | ICD-10-CM | POA: Diagnosis not present

## 2015-08-04 DIAGNOSIS — N186 End stage renal disease: Secondary | ICD-10-CM | POA: Diagnosis not present

## 2015-08-06 DIAGNOSIS — E119 Type 2 diabetes mellitus without complications: Secondary | ICD-10-CM | POA: Diagnosis not present

## 2015-08-06 DIAGNOSIS — N186 End stage renal disease: Secondary | ICD-10-CM | POA: Diagnosis not present

## 2015-08-06 DIAGNOSIS — N2581 Secondary hyperparathyroidism of renal origin: Secondary | ICD-10-CM | POA: Diagnosis not present

## 2015-08-06 DIAGNOSIS — D631 Anemia in chronic kidney disease: Secondary | ICD-10-CM | POA: Diagnosis not present

## 2015-08-06 DIAGNOSIS — D509 Iron deficiency anemia, unspecified: Secondary | ICD-10-CM | POA: Diagnosis not present

## 2015-08-09 ENCOUNTER — Telehealth: Payer: Self-pay | Admitting: Endocrinology

## 2015-08-09 DIAGNOSIS — E1129 Type 2 diabetes mellitus with other diabetic kidney complication: Secondary | ICD-10-CM | POA: Diagnosis not present

## 2015-08-09 DIAGNOSIS — N186 End stage renal disease: Secondary | ICD-10-CM | POA: Diagnosis not present

## 2015-08-09 DIAGNOSIS — D509 Iron deficiency anemia, unspecified: Secondary | ICD-10-CM | POA: Diagnosis not present

## 2015-08-09 DIAGNOSIS — E119 Type 2 diabetes mellitus without complications: Secondary | ICD-10-CM | POA: Diagnosis not present

## 2015-08-09 DIAGNOSIS — Z992 Dependence on renal dialysis: Secondary | ICD-10-CM | POA: Diagnosis not present

## 2015-08-09 DIAGNOSIS — D631 Anemia in chronic kidney disease: Secondary | ICD-10-CM | POA: Diagnosis not present

## 2015-08-09 DIAGNOSIS — N2581 Secondary hyperparathyroidism of renal origin: Secondary | ICD-10-CM | POA: Diagnosis not present

## 2015-08-09 NOTE — Progress Notes (Signed)
Cardiology Office Note:    Date:  08/10/2015   ID:  [name], DOB [birth year], MRN [id number]  PCP:  Walker Kehr, MD  Cardiologist:  Dr. Virl Axe   Electrophysiologist:  Dr. Virl Axe   Referring MD: Alain Marion Evie Lacks, MD   Chief Complaint  Patient presents with  . Shortness of Breath    History of Present Illness:    [name] is a 70 y.o. female with a hx of HTN, DM, ESRD on hemodialysis, LBBB, EAT treated with Amiodarone, CAD s/p NSTEMI in [id number], systolic HF (EF improved), LBBB, severe mitral stenosis, colon CA.  Echo in 1/15 demonstrated EF 55-60%.  Admitted 10/16 with acute hypoxic respiratory failure in the setting of non-STEMI. She required urgent dialysis. Follow-up echocardiogram demonstrated worsening LV function with an EF of 35-40%. Cardiac catheterization demonstrated severe multivessel disease with diffuse calcified LAD and distal LAD disease in conjunction with new findings in the proximal D1 and mid RCA. She underwent PCI with a DES to the D1. Unsuccessful attempt was made at PCI of the proximal RCA. If the patient continued to have refractory angina, rotational atherectomy and stenting of the RCA could be considered.  Last seen in clinic by Chanetta Marshall, NP 5/17.  She was maintaining sinus rhythm on amiodarone at that point.  She called in recently with complaints of shortness of breath. Echocardiogram was arranged and follow-up today. Echo 08/03/15 demonstrated EF 25-30%.  Here today with her daughter. Over the past several weeks, she's noted dyspnea exertion. She describes NYHA 2b-3 symptoms. This typically worsens prior to dialysis. It improves after dialysis. She notes improved symptoms the day after dialysis. She denies orthopnea, PND. She denies chest pain. She denies significant pedal edema. She denies syncope. She denies any bleeding issues.  Prior CV studies that were reviewed today include:    Echo 08/03/15 Moderate LVH, EF 25-30%,  diffuse HK, mild AI, severe MAC, moderate MR, moderate LAE, mild TR  LHC 10/28/14 LAD: Heavily calcified throughout, proximal 30%, distal 90%, D1 95%, D2 50%, 60%, lateral D2 80% LCx: Mid 75%, OM1 80% ostial, OM 260% RCA: Proximal 95%, RPDA ostial occluded, posterior Atrio ventricular branch 80% PCI: 2.25 x 12 mm Promus Premier DES to the D1; unsuccessful PCI of the proximal RCA 1. Successful stenting of the first diagonal with a DES 2. Unsuccessful stenting of the mid RCA due to inability to cross with a balloon. This despite good wire, guide, and Guideliner support. Plan: continue DAPT for one year. Optimize antianginal therapy. If she continues to have significant angina I would consider rotational atherectomy and stenting of the RCA.   Echo 10/25/14  Moderate LVH, EF 35-40%, mild AI, MAC, moderate MR, mild LAE, PASP 56 mmHg  Past Medical History:  Diagnosis Date  . Anemia   . Arthritis   . Atrial tachycardia ([place])    on amiod  . Cancer [facility])    colon cancer  . Cardiomyopathy- mixed   . Chronic diastolic heart failure ([place])   . Colostomy in place [facility])   . Coronary artery disease    Previously decreased EF; echo 113 normal LV function  . GERD (gastroesophageal reflux disease)   . Glaucoma   . Hernia, incisional    abd  . History of colon cancer    history of colon cancer.  1986  . Hyperlipidemia   . Hypertension   . Hypertensive heart disease    sees Dr. Alain Marion  . LBBB (left bundle branch  block)   . Mitral valve insufficiency and aortic valve insufficiency   . Obesity   . Renal failure    ESRD, Dr Dunham/Dr. Lyda Kalata.  M, W, Fr  . Stroke ([place])    2011/12  . Type II   diabetes mellitus without mention of complication, not stated as uncontrolled     Past Surgical History:  Procedure Laterality Date  . ARTERIOVENOUS GRAFT PLACEMENT  2010  . CARDIAC CATHETERIZATION    . CARDIAC CATHETERIZATION N/A 10/27/2014   Procedure: Left Heart Cath and Coronary Angiography;   Surgeon: Leonie Man, MD;  Location: [place] [facility];  Service: Cardiovascular;  Laterality: N/A;  . CARDIAC CATHETERIZATION N/A 10/28/2014   Procedure: Coronary Stent Intervention;  Surgeon: Peter M Martinique, MD;  Location: [place] [facility];  Service: Cardiovascular;  Laterality: N/A;  . COLONOSCOPY    . COLOSTOMY  1986  . ELECTROCARDIOGRAM  04-27-06  . ESOPHAGOGASTRODUODENOSCOPY  03-18-04  . ESOPHAGOGASTRODUODENOSCOPY N/A 02/02/2013   Procedure: ESOPHAGOGASTRODUODENOSCOPY (EGD);  Surgeon: Irene Shipper, MD;  Location: [facility] ENDOSCOPY;  Service: Endoscopy;  Laterality: N/A;  . EYE SURGERY     Cataract Left  . FOOT AMPUTATION THROUGH METATARSAL  10-07-10   Right foot transmetatarsal  . INSERTION OF AHMED VALVE Right 08/20/2013   Procedure: INSERTION OF AHMED VALVE WITH Mitomycin C application;  Surgeon: Marylynn Pearson, MD;  Location: [place];  Service: Ophthalmology;  Laterality: Right;  . INSERTION OF AHMED VALVE Left 07/22/2014   Procedure: INSERTION OF AHMED VALVE WITH Gage;  Surgeon: Marylynn Pearson, MD;  Location: [place];  Service: Ophthalmology;  Laterality: Left;  . left heart catheterization and right heart catheterization  12-10   R. heart cath showed elevated left and right heart filling pressures w/ pulmonary artery pressure elevated mildly out of proportion to the wedge. The left heart cath showed diffuse distal vessel disease as well as a 75% stenosis in the mid circumflex w/ a 90% stenosis of the ostial first obtuse marginal. These lesions were in close proximity. there was a 60-70%mild RCA stenosis.   [name] MITOMYCIN C APPLICATION Left [id number]   Procedure: MITOMYCIN C APPLICATION;  Surgeon: Marylynn Pearson, MD;  Location: [place];  Service: Ophthalmology;  Laterality: Left;  . PARS PLANA VITRECTOMY  11/29/2011   Procedure: PARS PLANA VITRECTOMY WITH 23 GAUGE;  Surgeon: Adonis Brook, MD;  Location: [place];  Service: Ophthalmology;  Laterality: Right;  Right Eye 23 ga vitrectomy with  membrane peel  . PARS PLANA VITRECTOMY Left 02/28/2012   Procedure: PARS PLANA VITRECTOMY WITH 23 GAUGE;  Surgeon: Adonis Brook, MD;  Location: [place];  Service: Ophthalmology;  Laterality: Left;  . placement of new left forearm arteriovenous graft  03-11-08  . REVISION UROSTOMY CUTANEOUS    . VAGINAL HYSTERECTOMY      Current Medications: Outpatient Medications Prior to Visit  Medication Sig Dispense Refill  . acetaminophen (TYLENOL) 500 MG tablet Take 500 mg by mouth every 6 (six) hours as needed (pain).    [name] aspirin 81 MG tablet Take 1 tablet (81 mg total) by mouth daily. 30 tablet 3  . BRILINTA 90 MG TABS tablet TAKE 1 TABLET BY MOUTH TWICE DAILY 60 tablet 9  . calcium acetate (PHOSLO) 667 MG capsule Take 1,334-2,001 mg by mouth See admin instructions. Take 2001mg  three times daily with meals and Take 1334mg   with snacks.    . dorzolamide-timolol (COSOPT) 22.3-6.8 MG/ML ophthalmic solution Place 1 drop into the left eye 4 (four)  times daily.  12  . Fluticasone-Salmeterol (ADVAIR) 250-50 MCG/DOSE AEPB Inhale 1 puff into the lungs daily as needed (shortness of breath).     [name] glucose blood (ONE TOUCH ULTRA TEST) test strip Check blood sugar 2 time per day. DX code E11.9 200 each 2  . metoprolol (LOPRESSOR) 50 MG tablet Take 1 tablet (50 mg total) by mouth 2 (two) times daily. Do not take this medication on dialysis days (Monday, Wednesday, Friday). 60 tablet 0  . nitroGLYCERIN (NITROSTAT) 0.4 MG SL tablet Place 1 tablet (0.4 mg total) under the tongue every 5 (five) minutes as needed for chest pain. 30 tablet 3  . prednisoLONE acetate (PRED FORTE) 1 % ophthalmic suspension Place 1 drop into the left eye 4 (four) times daily.    . repaglinide (PRANDIN) 0.5 MG tablet Take 1 tablet (0.5 mg total) by mouth 3 (three) times daily before meals. 90 tablet 11  . simvastatin (ZOCOR) 10 MG tablet Take 1 tablet (10 mg total) by mouth every evening. 90 tablet 3  . amiodarone (PACERONE) 200 MG tablet Take 0.5  tablets (100 mg total) by mouth daily. 45 tablet 1   Facility-Administered Medications Prior to Visit  Medication Dose Route Frequency Provider Last Rate Last Dose  . mitoMYcin (MUTAMYCIN) Injection Use in OR only (0.4 mg/ml)  0.5 mL Left Eye Once Marylynn Pearson, MD          Allergies:   Ace inhibitors; Eggs or egg-derived products; Enalapril; Lisinopril; and Omnipaque [iohexol]   Social History   Social History  . Marital status: Single    Spouse name: N/A  . Number of children: N/A  . Years of education: N/A   Social History Main Topics  . Smoking status: Never Smoker  . Smokeless tobacco: Never Used  . Alcohol use No  . Drug use: No  . Sexual activity: No   Other Topics Concern  . None   Social History Narrative  . None     Family History:  The patient's family history includes Cancer in her sister; Coronary artery disease in her mother; Diabetes in her father; Hypertension in her father, mother, and other.   ROS:   Please see the history of present illness.    Review of Systems  Constitution: Positive for decreased appetite.  Eyes: Positive for visual disturbance.  Cardiovascular: Positive for dyspnea on exertion.  Respiratory: Positive for cough.    All other systems reviewed and are negative.   EKGs/Labs/Other Test Reviewed:    EKG:  EKG is  ordered today.  The ekg ordered today demonstrates sinus tach vs atrial tach, HR 121, LBBB  Recent Labs: 10/23/2014: B Natriuretic Peptide 1,300.0 10/29/2014: BUN 32; Creatinine, Ser 5.16; Hemoglobin 10.7; Platelets 330; Potassium 4.6; Sodium 133 08/10/2015: TSH 1.06   Recent Lipid Panel    Component Value Date/Time   CHOL 166 10/25/2014 0545   TRIG 205 (H) 10/25/2014 0545   HDL 27 (L) 10/25/2014 0545   CHOLHDL 6.1 10/25/2014 0545   VLDL 41 (H) 10/25/2014 0545   LDLCALC 98 10/25/2014 0545     Physical Exam:    VS:  BP (!) 104/58   Pulse (!) 120   Ht 5' (1.524 m)   Wt 190 lb 12.8 oz (86.5 kg)   BMI 37.26  kg/m     Wt Readings from Last 3 Encounters:  08/10/15 190 lb 12.8 oz (86.5 kg)  06/01/15 194 lb (88 kg)  05/13/15 195 lb 2 oz (88.5 kg)  Physical Exam  Constitutional: She is oriented to person, place, and time. She appears well-developed and well-nourished. No distress.  HENT:  Head: Normocephalic and atraumatic.  Neck:  JVP 5-6 cm at 90 degrees  Cardiovascular: Regular rhythm.  Tachycardia present.   No murmur heard. Pulmonary/Chest: Effort normal. She has no wheezes. She has no rales.  Abdominal: Soft. There is no tenderness.  Musculoskeletal:  1+ bilat LE edema  Neurological: She is alert and oriented to person, place, and time.  Skin: Skin is warm and dry.  Psychiatric: She has a normal mood and affect.     ASSESSMENT:    1. Atrial tachycardia ([place])   2. SOB (shortness of breath) on exertion   3. DCM (dilated cardiomyopathy) ([place])   4. Chronic systolic heart failure ([place])   5. Coronary artery disease involving native coronary artery of native heart without angina pectoris   6. On amiodarone therapy   7. ESRD (end stage renal disease) on dialysis [facility])    PLAN:    In order of problems listed above:  1. ATach - I reviewed her EKG today with Dr. Irish Lack (DOD).  She has worsening LV function with diffuse hypokinesis on recent echocardiogram. Question if this is all tachycardia mediated. Question if her dyspnea is all related to her elevated heart rate. Her blood pressure will not tolerate further adjustments in her beta blocker. At this point, I am not certain that she would tolerate changing metoprolol to carvedilol.  -  Increase amiodarone to 200 mg twice a day 2 weeks, then 200 mg daily  -  Reviewed ECG with Dr. Caryl Comes >> he agreed this is ATach vs sinus tach  -  Close follow-up in 2 weeks  2. Dyspnea - Possibly related to tachycardia and worsening LV function from tachycardia mediation. However, she does have residual CAD. If dyspnea remains despite improved  heart rate with adjustments in amiodarone, consider cardiac catheterization with an eye towards PCI of the RCA.  3. Cardiomyopathy - As noted, cardiomyopathy may be worsened related to tachycardia. Blood pressure is too low to allow further titration of beta blocker or add ACE inhibitor.  We could consider Ivabridine if HR remains high.   4. Chronic systolic CHF - On exam today, volume appears stable. Volume management per dialysis. She is NYHA 2b-3.  5. CAD:  s/p NSTEMI in 10/16. LHC demonstrated diffuse CAD and she underwent PCI of the D2 with a DES.  She had unsuccessful PCI of the RCA. She could undergo rotational atherectomy of the RCA if she has refractory angina. She never had chest pain. Continue ASA, statin, Brilinta, beta-blocker.   As noted, if dyspnea does not improve with improved heart rate, consider cardiac catheterization with an eye towards PCI of the RCA.  6. Amiodarone Rx - Obtain TSH, LFTs today.  7. ESRD - MWF dialysis.   Medication Adjustments/Labs and Tests Ordered: Current medicines are reviewed at length with the patient today.  Concerns regarding medicines are outlined above.  Medication changes, Labs and Tests ordered today are outlined in the Patient Instructions noted below. Patient Instructions  Medication Instructions:  1. INCREASE AMIODARONE TO 200 MG TWICE DAILY FOR 2 WEEKS; AFTER 2 WEEKS GO TO 200 MG DAILY Labwork: TODAY TSH, LFT Testing/Procedures: NONE Follow-Up: DR. Caryl Comes OR Cynthia Standard, PA OR Cynthia SILER, NP Any Other Special Instructions Will Be Listed Below (If Applicable). If you need a refill on your cardiac medications before your next appointment, please call your pharmacy.  Signed, Richardson Dopp, PA-C  08/10/2015 5:58 PM    [facility] [address], [place], [place]  [address] Phone: [phone]; Fax: [phone]

## 2015-08-09 NOTE — Telephone Encounter (Signed)
PT stated [employer] needs forms from Dr. Loanne Drilling for her to get her diabetic shoes, they said they have already sent [place] the information.

## 2015-08-10 ENCOUNTER — Ambulatory Visit (INDEPENDENT_AMBULATORY_CARE_PROVIDER_SITE_OTHER): Payer: Medicare Other | Admitting: Physician Assistant

## 2015-08-10 ENCOUNTER — Encounter: Payer: Self-pay | Admitting: Physician Assistant

## 2015-08-10 ENCOUNTER — Encounter (INDEPENDENT_AMBULATORY_CARE_PROVIDER_SITE_OTHER): Payer: Self-pay

## 2015-08-10 ENCOUNTER — Other Ambulatory Visit: Payer: Self-pay | Admitting: Physician Assistant

## 2015-08-10 VITALS — BP 104/58 | HR 120 | Ht 60.0 in | Wt 190.8 lb

## 2015-08-10 DIAGNOSIS — I2510 Atherosclerotic heart disease of native coronary artery without angina pectoris: Secondary | ICD-10-CM

## 2015-08-10 DIAGNOSIS — I420 Dilated cardiomyopathy: Secondary | ICD-10-CM | POA: Diagnosis not present

## 2015-08-10 DIAGNOSIS — I5022 Chronic systolic (congestive) heart failure: Secondary | ICD-10-CM | POA: Diagnosis not present

## 2015-08-10 DIAGNOSIS — I471 Supraventricular tachycardia: Secondary | ICD-10-CM | POA: Diagnosis not present

## 2015-08-10 DIAGNOSIS — R0602 Shortness of breath: Secondary | ICD-10-CM | POA: Diagnosis not present

## 2015-08-10 DIAGNOSIS — Z992 Dependence on renal dialysis: Secondary | ICD-10-CM

## 2015-08-10 DIAGNOSIS — N186 End stage renal disease: Secondary | ICD-10-CM

## 2015-08-10 DIAGNOSIS — Z79899 Other long term (current) drug therapy: Secondary | ICD-10-CM | POA: Diagnosis not present

## 2015-08-10 DIAGNOSIS — I255 Ischemic cardiomyopathy: Secondary | ICD-10-CM

## 2015-08-10 LAB — TSH: TSH: 1.06 mIU/L

## 2015-08-10 MED ORDER — AMIODARONE HCL 200 MG PO TABS: 200.0000 mg | ORAL_TABLET | Freq: Every day | ORAL | 3 refills | Status: DC

## 2015-08-10 NOTE — Patient Instructions (Addendum)
Medication Instructions:  1. INCREASE AMIODARONE TO 200 MG TWICE DAILY FOR 2 WEEKS; AFTER 2 WEEKS GO TO 200 MG DAILY Labwork: TODAY TSH, LFT Testing/Procedures: NONE Follow-Up: DR. Caryl Comes OR Tommye Standard, PA OR AMBER SILER, NP Any Other Special Instructions Will Be Listed Below (If Applicable). If you need a refill on your cardiac medications before your next appointment, please call your pharmacy.

## 2015-08-10 NOTE — Telephone Encounter (Signed)
Patient is calling checking on the status of her form.

## 2015-08-11 ENCOUNTER — Telehealth: Payer: Self-pay | Admitting: *Deleted

## 2015-08-11 DIAGNOSIS — D509 Iron deficiency anemia, unspecified: Secondary | ICD-10-CM | POA: Diagnosis not present

## 2015-08-11 DIAGNOSIS — E119 Type 2 diabetes mellitus without complications: Secondary | ICD-10-CM | POA: Diagnosis not present

## 2015-08-11 DIAGNOSIS — D631 Anemia in chronic kidney disease: Secondary | ICD-10-CM | POA: Diagnosis not present

## 2015-08-11 DIAGNOSIS — N186 End stage renal disease: Secondary | ICD-10-CM | POA: Diagnosis not present

## 2015-08-11 DIAGNOSIS — N2581 Secondary hyperparathyroidism of renal origin: Secondary | ICD-10-CM | POA: Diagnosis not present

## 2015-08-11 NOTE — Telephone Encounter (Signed)
S/w rep at [facility] lab who will add on LFT from yesterday that was not done in our office as planned.

## 2015-08-11 NOTE — Telephone Encounter (Signed)
Attempted to reach the pt. Pt was unavailable and vm was full. Will try again at a later time.

## 2015-08-12 ENCOUNTER — Telehealth: Payer: Self-pay | Admitting: *Deleted

## 2015-08-12 DIAGNOSIS — I110 Hypertensive heart disease with heart failure: Secondary | ICD-10-CM

## 2015-08-12 LAB — HEPATIC FUNCTION PANEL

## 2015-08-12 NOTE — Telephone Encounter (Signed)
Pt advised we have not received her forms. Pt advised to call [employer] and have them re-fax the forms and Dr. Loanne Drilling will review once he returns from vacation.

## 2015-08-12 NOTE — Telephone Encounter (Signed)
Patient stated that [facility] faxed the form the 24th because they don't trust the fax. And will fax it back out today.

## 2015-08-12 NOTE — Telephone Encounter (Signed)
Fax received and placed on MD's desk to review once he returns.

## 2015-08-12 NOTE — Telephone Encounter (Signed)
Recording came on, states not enough room to lmom. Pt has appt 8/22 appt with Tommye Standard, PA. On 8/1 pt had lab work which should had been TSH, LFT. Our lab only drew TSH and not the LFT. I s/w [facility] yesterday 8/2 to have LFT added on. Today [facility] advised in LFT results that there was not enough blood from the TSH on 8/1 so LFT's cannot be resulted. Pt has appt 8/22 we will get LFT at that time per Brynda Rim. PA. Order and appt placed today .

## 2015-08-13 ENCOUNTER — Telehealth: Payer: Self-pay | Admitting: Internal Medicine

## 2015-08-13 DIAGNOSIS — D631 Anemia in chronic kidney disease: Secondary | ICD-10-CM | POA: Diagnosis not present

## 2015-08-13 DIAGNOSIS — E119 Type 2 diabetes mellitus without complications: Secondary | ICD-10-CM | POA: Diagnosis not present

## 2015-08-13 DIAGNOSIS — D509 Iron deficiency anemia, unspecified: Secondary | ICD-10-CM | POA: Diagnosis not present

## 2015-08-13 DIAGNOSIS — N186 End stage renal disease: Secondary | ICD-10-CM | POA: Diagnosis not present

## 2015-08-13 DIAGNOSIS — N2581 Secondary hyperparathyroidism of renal origin: Secondary | ICD-10-CM | POA: Diagnosis not present

## 2015-08-13 NOTE — Telephone Encounter (Signed)
Phone note dated 08/12/15 states LFT's would be done at appt on 8/22 with Tommye Standard, PA . Correct call back number for [facility] lab is [phone]. I spoke with [facility] and told them we had received message regarding specimen and arrangements have been made for it to be redrawn.

## 2015-08-13 NOTE — Telephone Encounter (Signed)
New message        The lab doesn't have of the specimen to add on liver panel.

## 2015-08-16 ENCOUNTER — Telehealth: Payer: Self-pay | Admitting: *Deleted

## 2015-08-16 DIAGNOSIS — N186 End stage renal disease: Secondary | ICD-10-CM | POA: Diagnosis not present

## 2015-08-16 DIAGNOSIS — E119 Type 2 diabetes mellitus without complications: Secondary | ICD-10-CM | POA: Diagnosis not present

## 2015-08-16 DIAGNOSIS — D631 Anemia in chronic kidney disease: Secondary | ICD-10-CM | POA: Diagnosis not present

## 2015-08-16 DIAGNOSIS — D509 Iron deficiency anemia, unspecified: Secondary | ICD-10-CM | POA: Diagnosis not present

## 2015-08-16 DIAGNOSIS — N2581 Secondary hyperparathyroidism of renal origin: Secondary | ICD-10-CM | POA: Diagnosis not present

## 2015-08-16 NOTE — Telephone Encounter (Signed)
Pt notified of TSH results, LFT's were not done and will be done on 8/22

## 2015-08-17 ENCOUNTER — Encounter: Payer: Self-pay | Admitting: Physician Assistant

## 2015-08-17 NOTE — Telephone Encounter (Signed)
Patient calling to see if you recieved the from [employer] about her diabetic shoes, please advise

## 2015-08-17 NOTE — Telephone Encounter (Signed)
I contacted the pt and left a vm advised we have received the diabetic shoe form from [employer]. Pt advised Dr. Loanne Drilling is out of the office and will return on 08/23/2015 and once he returns he will review the forms. Pt advised to call back if she has any questions.

## 2015-08-18 DIAGNOSIS — D631 Anemia in chronic kidney disease: Secondary | ICD-10-CM | POA: Diagnosis not present

## 2015-08-18 DIAGNOSIS — N186 End stage renal disease: Secondary | ICD-10-CM | POA: Diagnosis not present

## 2015-08-18 DIAGNOSIS — E119 Type 2 diabetes mellitus without complications: Secondary | ICD-10-CM | POA: Diagnosis not present

## 2015-08-18 DIAGNOSIS — D509 Iron deficiency anemia, unspecified: Secondary | ICD-10-CM | POA: Diagnosis not present

## 2015-08-18 DIAGNOSIS — N2581 Secondary hyperparathyroidism of renal origin: Secondary | ICD-10-CM | POA: Diagnosis not present

## 2015-08-20 DIAGNOSIS — D509 Iron deficiency anemia, unspecified: Secondary | ICD-10-CM | POA: Diagnosis not present

## 2015-08-20 DIAGNOSIS — N2581 Secondary hyperparathyroidism of renal origin: Secondary | ICD-10-CM | POA: Diagnosis not present

## 2015-08-20 DIAGNOSIS — N186 End stage renal disease: Secondary | ICD-10-CM | POA: Diagnosis not present

## 2015-08-20 DIAGNOSIS — E119 Type 2 diabetes mellitus without complications: Secondary | ICD-10-CM | POA: Diagnosis not present

## 2015-08-20 DIAGNOSIS — D631 Anemia in chronic kidney disease: Secondary | ICD-10-CM | POA: Diagnosis not present

## 2015-08-23 DIAGNOSIS — N2581 Secondary hyperparathyroidism of renal origin: Secondary | ICD-10-CM | POA: Diagnosis not present

## 2015-08-23 DIAGNOSIS — D509 Iron deficiency anemia, unspecified: Secondary | ICD-10-CM | POA: Diagnosis not present

## 2015-08-23 DIAGNOSIS — N186 End stage renal disease: Secondary | ICD-10-CM | POA: Diagnosis not present

## 2015-08-23 DIAGNOSIS — E119 Type 2 diabetes mellitus without complications: Secondary | ICD-10-CM | POA: Diagnosis not present

## 2015-08-23 DIAGNOSIS — D631 Anemia in chronic kidney disease: Secondary | ICD-10-CM | POA: Diagnosis not present

## 2015-08-25 DIAGNOSIS — N2581 Secondary hyperparathyroidism of renal origin: Secondary | ICD-10-CM | POA: Diagnosis not present

## 2015-08-25 DIAGNOSIS — D631 Anemia in chronic kidney disease: Secondary | ICD-10-CM | POA: Diagnosis not present

## 2015-08-25 DIAGNOSIS — N186 End stage renal disease: Secondary | ICD-10-CM | POA: Diagnosis not present

## 2015-08-25 DIAGNOSIS — E119 Type 2 diabetes mellitus without complications: Secondary | ICD-10-CM | POA: Diagnosis not present

## 2015-08-25 DIAGNOSIS — D509 Iron deficiency anemia, unspecified: Secondary | ICD-10-CM | POA: Diagnosis not present

## 2015-08-27 DIAGNOSIS — E119 Type 2 diabetes mellitus without complications: Secondary | ICD-10-CM | POA: Diagnosis not present

## 2015-08-27 DIAGNOSIS — D631 Anemia in chronic kidney disease: Secondary | ICD-10-CM | POA: Diagnosis not present

## 2015-08-27 DIAGNOSIS — N2581 Secondary hyperparathyroidism of renal origin: Secondary | ICD-10-CM | POA: Diagnosis not present

## 2015-08-27 DIAGNOSIS — D509 Iron deficiency anemia, unspecified: Secondary | ICD-10-CM | POA: Diagnosis not present

## 2015-08-27 DIAGNOSIS — N186 End stage renal disease: Secondary | ICD-10-CM | POA: Diagnosis not present

## 2015-08-30 DIAGNOSIS — E119 Type 2 diabetes mellitus without complications: Secondary | ICD-10-CM | POA: Diagnosis not present

## 2015-08-30 DIAGNOSIS — N186 End stage renal disease: Secondary | ICD-10-CM | POA: Diagnosis not present

## 2015-08-30 DIAGNOSIS — N2581 Secondary hyperparathyroidism of renal origin: Secondary | ICD-10-CM | POA: Diagnosis not present

## 2015-08-30 DIAGNOSIS — D509 Iron deficiency anemia, unspecified: Secondary | ICD-10-CM | POA: Diagnosis not present

## 2015-08-30 DIAGNOSIS — D631 Anemia in chronic kidney disease: Secondary | ICD-10-CM | POA: Diagnosis not present

## 2015-08-31 ENCOUNTER — Other Ambulatory Visit: Payer: Medicare Other | Admitting: Physician Assistant

## 2015-08-31 ENCOUNTER — Ambulatory Visit (INDEPENDENT_AMBULATORY_CARE_PROVIDER_SITE_OTHER): Payer: Medicare Other | Admitting: Physician Assistant

## 2015-08-31 VITALS — BP 108/64 | HR 84 | Ht 60.0 in | Wt 188.0 lb

## 2015-08-31 DIAGNOSIS — I255 Ischemic cardiomyopathy: Secondary | ICD-10-CM

## 2015-08-31 DIAGNOSIS — R000 Tachycardia, unspecified: Secondary | ICD-10-CM

## 2015-08-31 DIAGNOSIS — I420 Dilated cardiomyopathy: Secondary | ICD-10-CM

## 2015-08-31 DIAGNOSIS — I110 Hypertensive heart disease with heart failure: Secondary | ICD-10-CM | POA: Diagnosis not present

## 2015-08-31 DIAGNOSIS — I2510 Atherosclerotic heart disease of native coronary artery without angina pectoris: Secondary | ICD-10-CM | POA: Diagnosis not present

## 2015-08-31 LAB — BASIC METABOLIC PANEL
BUN: 27 mg/dL — AB (ref 7–25)
CHLORIDE: 96 mmol/L — AB (ref 98–110)
CO2: 30 mmol/L (ref 20–31)
Calcium: 8.7 mg/dL (ref 8.6–10.4)
Creat: 4.78 mg/dL — ABNORMAL HIGH (ref 0.50–0.99)
GLUCOSE: 112 mg/dL — AB (ref 65–99)
POTASSIUM: 4.8 mmol/L (ref 3.5–5.3)
Sodium: 141 mmol/L (ref 135–146)

## 2015-08-31 LAB — HEPATIC FUNCTION PANEL
ALT: 10 U/L (ref 6–29)
AST: 12 U/L (ref 10–35)
Albumin: 3.6 g/dL (ref 3.6–5.1)
Alkaline Phosphatase: 86 U/L (ref 33–130)
BILIRUBIN DIRECT: 0.1 mg/dL (ref <=0.2)
BILIRUBIN INDIRECT: 0.4 mg/dL (ref 0.2–1.2)
TOTAL PROTEIN: 6.9 g/dL (ref 6.1–8.1)
Total Bilirubin: 0.5 mg/dL (ref 0.2–1.2)

## 2015-08-31 NOTE — Patient Instructions (Addendum)
Medication Instructions:   Your physician recommends that you continue on your current medications as directed. Please refer to the Current Medication list given to you today.   If you need a refill on your cardiac medications before your next appointment, please call your pharmacy.  Labwork: LIVER PANEL TODAY    Testing/Procedures: Your physician has recommended that you wear a 24 HOUR holter monitor. Holter monitors are medical devices that record the heart's electrical activity. Doctors most often use these monitors to diagnose arrhythmias. Arrhythmias are problems with the speed or rhythm of the heartbeat. The monitor is a small, portable device. You can wear one while you do your normal daily activities. This is usually used to diagnose what is causing palpitations/syncope (passing out).     Follow-Up: IN 2 WEEKS WITH KLEIN OR WITH RENEE URSUY ON A DAY DR Caryl Comes IN OFFICE ( NEEDS TO BE SEEN SOONER THEN ALREADY SCHEDULED APPT)   Any Other Special Instructions Will Be Listed Below (If Applicable).

## 2015-08-31 NOTE — Progress Notes (Signed)
[employer]  

## 2015-08-31 NOTE — Progress Notes (Signed)
Cardiology Office Note Date:  08/31/2015  Patient ID:  [name], [name] [birth year], MRN [id number] PCP:  Walker Kehr, MD  Cardiologist:  Dr. Caryl Comes    Chief Complaint:  Follow up  History of Present Illness: [name] is a 70 y.o. female with history of ESRF on HD, HTN, DM, LBBB, PAT on amiodarone, CAD, admitted 10/16 with acute hypoxic respiratory failure in the setting of non-STEMI. She required urgent dialysis. Follow-up echocardiogram demonstrated worsening LV function with an EF of 35-40%. Cardiac catheterization demonstrated severe multivessel disease with diffuse calcified LAD and distal LAD disease in conjunction with new findings in the proximal D1 and mid RCA. She underwent PCI with a DES to the D1. Unsuccessful attempt was made at PCI of the proximal RCA. If the patient continued to have refractory angina, rotational atherectomy and stenting of the RCA could be considered.  She was seen by Richardson Dopp, PA 08/10/15 with c/o SOB, noted to be in ST vs ATach, his note mentions: I reviewed her EKG today with Dr. Irish Lack (DOD).  She has worsening LV function with diffuse hypokinesis on recent echocardiogram. Question if this is all tachycardia mediated. Question if her dyspnea is all related to her elevated heart rate. Her blood pressure will not tolerate further adjustments in her beta blocker. At this point, I am not certain that she would tolerate changing metoprolol to carvedilol.                       -  Increase amiodarone to 200 mg twice a day 2 weeks, then 200 mg daily                       -  Reviewed ECG with Dr. Caryl Comes >> he agreed this is ATach vs sinus tach                       -  Close follow-up in 2 weeks  2. Dyspnea - Possibly related to tachycardia and worsening LV function from tachycardia mediation. However, she does have residual CAD. If dyspnea remains despite improved heart rate with adjustments in amiodarone, consider cardiac catheterization with an eye  towards PCI of the RCA.   The patient comes in today to be seen for Dr. Caryl Comes, she is feeling improved, her DOE is less, though stioll gets a little winded.  She is accompanied by her daughter who lives with her and she agrees, she is better.  The patient denies any kind of CP, she does not perceive her heart beat fast or having any sensation of palpitations, no dizziness, near syncope or syncope.  She denies symptoms of PND or orthopnea.  She ambulates with a walker.    I note that she does not take her lopressor on HD days, they confirm that  Her BP tends to dip low with HD.  She is down to 200mg  daily of the amiodarone, after a 2 week increase of 200mg  BID dosing regime at her last visit  Past Medical History:  Diagnosis Date  . Anemia   . Arthritis   . Atrial tachycardia ([place])    on amiod  . Cancer [facility])    colon cancer  . Cardiomyopathy- mixed   . Chronic diastolic heart failure ([place])   . Colostomy in place [facility])   . Coronary artery disease    Previously decreased EF; echo 113 normal LV function  . GERD (  gastroesophageal reflux disease)   . Glaucoma   . Hernia, incisional    abd  . History of colon cancer    history of colon cancer.  1986  . Hyperlipidemia   . Hypertension   . Hypertensive heart disease    sees Dr. Alain Marion  . LBBB (left bundle branch block)   . Mitral valve insufficiency and aortic valve insufficiency   . Obesity   . Renal failure    ESRD, Dr Dunham/Dr. Lyda Kalata.  M, W, Fr  . Stroke ([place])    2011/12  . Type II   diabetes mellitus without mention of complication, not stated as uncontrolled     Past Surgical History:  Procedure Laterality Date  . ARTERIOVENOUS GRAFT PLACEMENT  2010  . CARDIAC CATHETERIZATION    . CARDIAC CATHETERIZATION N/A 10/27/2014   Procedure: Left Heart Cath and Coronary Angiography;  Surgeon: Leonie Man, MD;  Location: [place] [facility];  Service: Cardiovascular;  Laterality: N/A;  . CARDIAC CATHETERIZATION N/A  10/28/2014   Procedure: Coronary Stent Intervention;  Surgeon: Peter M Martinique, MD;  Location: [place] [facility];  Service: Cardiovascular;  Laterality: N/A;  . COLONOSCOPY    . COLOSTOMY  1986  . ELECTROCARDIOGRAM  04-27-06  . ESOPHAGOGASTRODUODENOSCOPY  03-18-04  . ESOPHAGOGASTRODUODENOSCOPY N/A 02/02/2013   Procedure: ESOPHAGOGASTRODUODENOSCOPY (EGD);  Surgeon: Irene Shipper, MD;  Location[facility] ENDOSCOPY;  Service: Endoscopy;  Laterality: N/A;  . EYE SURGERY     Cataract Left  . FOOT AMPUTATION THROUGH METATARSAL  10-07-10   Right foot transmetatarsal  . INSERTION OF AHMED VALVE Right 08/20/2013   Procedure: INSERTION OF AHMED VALVE WITH Mitomycin C application;  Surgeon: Marylynn Pearson, MD;  Location: [place];  Service: Ophthalmology;  Laterality: Right;  . INSERTION OF AHMED VALVE Left 07/22/2014   Procedure: INSERTION OF AHMED VALVE WITH [place];  Surgeon: Marylynn Pearson, MD;  Location: [place];  Service: Ophthalmology;  Laterality: Left;  . left heart catheterization and right heart catheterization  12-10   R. heart cath showed elevated left and right heart filling pressures w/ pulmonary artery pressure elevated mildly out of proportion to the wedge. The left heart cath showed diffuse distal vessel disease as well as a 75% stenosis in the mid circumflex w/ a 90% stenosis of the ostial first obtuse marginal. These lesions were in close proximity. there was a 60-70%mild RCA stenosis.   [name] MITOMYCIN C APPLICATION Left [id number]   Procedure: MITOMYCIN C APPLICATION;  Surgeon: Marylynn Pearson, MD;  Location: [place];  Service: Ophthalmology;  Laterality: Left;  . PARS PLANA VITRECTOMY  11/29/2011   Procedure: PARS PLANA VITRECTOMY WITH 23 GAUGE;  Surgeon: Adonis Brook, MD;  Location: [place];  Service: Ophthalmology;  Laterality: Right;  Right Eye 23 ga vitrectomy with membrane peel  . PARS PLANA VITRECTOMY Left 02/28/2012   Procedure: PARS PLANA VITRECTOMY WITH 23 GAUGE;  Surgeon: Adonis Brook, MD;   Location: [place];  Service: Ophthalmology;  Laterality: Left;  . placement of new left forearm arteriovenous graft  03-11-08  . REVISION UROSTOMY CUTANEOUS    . VAGINAL HYSTERECTOMY      Current Outpatient Prescriptions  Medication Sig Dispense Refill  . acetaminophen (TYLENOL) 500 MG tablet Take 500 mg by mouth every 6 (six) hours as needed (pain).    [name] amiodarone (PACERONE) 200 MG tablet Take 1 tablet (200 mg total) by mouth daily. 90 tablet 3  . aspirin 81 MG tablet Take 1 tablet (81 mg total)  by mouth daily. 30 tablet 3  . BRILINTA 90 MG TABS tablet TAKE 1 TABLET BY MOUTH TWICE DAILY 60 tablet 9  . calcium acetate (PHOSLO) 667 MG capsule Take 1,334-2,001 mg by mouth See admin instructions. Take 2001mg  three times daily with meals and Take 1334mg   with snacks.    . dorzolamide-timolol (COSOPT) 22.3-6.8 MG/ML ophthalmic solution Place 1 drop into the left eye 4 (four) times daily.  12  . Fluticasone-Salmeterol (ADVAIR) 250-50 MCG/DOSE AEPB Inhale 1 puff into the lungs daily as needed (shortness of breath).     [name] glucose blood (ONE TOUCH ULTRA TEST) test strip Check blood sugar 2 time per day. DX code E11.9 200 each 2  . metoprolol (LOPRESSOR) 50 MG tablet Take 1 tablet (50 mg total) by mouth 2 (two) times daily. Do not take this medication on dialysis days (Monday, Wednesday, Friday). 60 tablet 0  . nitroGLYCERIN (NITROSTAT) 0.4 MG SL tablet Place 1 tablet (0.4 mg total) under the tongue every 5 (five) minutes as needed for chest pain. 30 tablet 3  . prednisoLONE acetate (PRED FORTE) 1 % ophthalmic suspension Place 1 drop into the left eye 4 (four) times daily.    . repaglinide (PRANDIN) 0.5 MG tablet Take 1 tablet (0.5 mg total) by mouth 3 (three) times daily before meals. 90 tablet 11  . simvastatin (ZOCOR) 10 MG tablet Take 1 tablet (10 mg total) by mouth every evening. 90 tablet 3   No current facility-administered medications for this visit.    Facility-Administered Medications Ordered in  Other Visits  Medication Dose Route Frequency Provider Last Rate Last Dose  . mitoMYcin (MUTAMYCIN) Injection Use in OR only (0.4 mg/ml)  0.5 mL Left Eye Once Marylynn Pearson, MD        Allergies:   Ace inhibitors; Eggs or egg-derived products; Enalapril; Lisinopril; and Omnipaque [iohexol]   Social History:  The patient  reports that she has never smoked. She has never used smokeless tobacco. She reports that she does not drink alcohol or use drugs.   Family History:  The patient's family history includes Cancer in her sister; Coronary artery disease in her mother; Diabetes in her father; Hypertension in her father, mother, and other.  ROS:  Please see the history of present illness.    All other systems are reviewed and otherwise negative.   PHYSICAL EXAM:  VS:  BP 108/64   Pulse 84   Ht 5' (1.524 m)   Wt 188 lb (85.3 kg)   BMI 36.72 kg/m  BMI: Body mass index is 36.72 kg/m. Well nourished, well developed, in no acute distress, appears older then her age 85: normocephalic, atraumatic  Neck: no JVD, carotid bruits or masses Cardiac:  RRR; tachycardic, no significant murmurs, no rubs, or gallops Lungs:  clear to auscultation bilaterally, no wheezing, rhonchi or rales  Abd: soft, nontender MS: no deformity or atrophy Ext: trace LE edema  Skin: warm and dry, no rash Neuro:  No gross deficits appreciated Psych: euthymic mood, full affect   EKG:  Done today and reviewed by myself shows LAD, IVCD, tcahycardia, appears similar to prior EKGs, a little slower today, ? P waves able to be seen  Echo 08/03/15 Moderate LVH, EF 25-30%, diffuse HK, mild AI, severe MAC, moderate MR, moderate LAE, mild TR  LHC 10/28/14 LAD: Heavily calcified throughout, proximal 30%, distal 90%, D1 95%, D2 50%, 60%, lateral D2 80% LCx: Mid 75%, OM1 80% ostial, OM 260% RCA: Proximal 95%, RPDA ostial occluded,  posterior Atrio ventricular branch 80% PCI: 2.25 x 12 mm Promus Premier DES to the D1;  unsuccessful PCI of the proximal RCA 1. Successful stenting of the first diagonal with a DES 2. Unsuccessful stenting of the mid RCA due to inability to cross with a balloon. This despite good wire, guide, and Guideliner support. Plan: continue DAPT for one year. Optimize antianginal therapy. If she continues to have significant angina I would consider rotational atherectomy and stenting of the RCA.   Echo 10/25/14  Moderate LVH, EF 35-40%, mild AI, MAC, moderate MR, mild LAE, PASP 56 mmHg   Recent Labs: 10/23/2014: B Natriuretic Peptide 1,300.0 10/29/2014: BUN 32; Creatinine, Ser 5.16; Hemoglobin 10.7; Platelets 330; Potassium 4.6; Sodium 133 08/10/2015: ALT CANCELED; TSH 1.06  10/25/2014: Cholesterol 166; HDL 27; LDL Cholesterol 98; Total CHOL/HDL Ratio 6.1; Triglycerides 205; VLDL 41   CrCl cannot be calculated (Patient's most recent lab result is older than the maximum 21 days allowed.).   Wt Readings from Last 3 Encounters:  08/31/15 188 lb (85.3 kg)  08/10/15 190 lb 12.8 oz (86.5 kg)  06/01/15 194 lb (88 kg)     Other studies reviewed: Additional studies/records reviewed today include: summarized above  ASSESSMENT AND PLAN:  1. ATach?     appears in the same rhythm, though a bit slower, ?sinus  2. DOE     Improved, no rest SOB, no c/o PND or orthopnea    fluid management with HD  3. CAD      no CP     On ASA, brilinta, BB, statin  4. CM, EF 25-30%     On BB (limited with BP)     Is new, ? Etiology tachycardia mediated  Disposition: Discussed with Dr. Caryl Comes, we will have her wear a 24hour monitor to evaluate her HR/rhythm better, drew for LFTs today, will see her back in 2 weeks, sooner if needed  Current medicines are reviewed at length with the patient today.  The patient did not have any concerns regarding medicines.  Haywood Lasso, PA-C 08/31/2015 3:16 PM     [facility] [address] [place] [address] [phone]  (office)  [phone] (fax)

## 2015-09-01 ENCOUNTER — Telehealth: Payer: Self-pay | Admitting: *Deleted

## 2015-09-01 DIAGNOSIS — E119 Type 2 diabetes mellitus without complications: Secondary | ICD-10-CM | POA: Diagnosis not present

## 2015-09-01 DIAGNOSIS — N2581 Secondary hyperparathyroidism of renal origin: Secondary | ICD-10-CM | POA: Diagnosis not present

## 2015-09-01 DIAGNOSIS — D631 Anemia in chronic kidney disease: Secondary | ICD-10-CM | POA: Diagnosis not present

## 2015-09-01 DIAGNOSIS — D509 Iron deficiency anemia, unspecified: Secondary | ICD-10-CM | POA: Diagnosis not present

## 2015-09-01 DIAGNOSIS — N186 End stage renal disease: Secondary | ICD-10-CM | POA: Diagnosis not present

## 2015-09-01 NOTE — Telephone Encounter (Signed)
Recording stating no enough room to lmom . Tried to call to go over lab results.

## 2015-09-01 NOTE — Telephone Encounter (Signed)
-----   Message from [facility], [place] sent at 08/31/2015  5:12 PM EDT ----- Please let the patient know her lab (liver enzymes) are ok.  No changes  Thanks [employer]

## 2015-09-01 NOTE — Telephone Encounter (Signed)
LMOVM OF OFFICE TO CALL BACK FOR RESULTS

## 2015-09-01 NOTE — Telephone Encounter (Signed)
Pt has been notified of lab results ordered by Brynda Rim. PA BMET. Pt also notified per Cammie Mcgee, CMA of LFT ordered by Tommye Standard, PA. Pt verbalized understanding to all results.

## 2015-09-02 ENCOUNTER — Ambulatory Visit (INDEPENDENT_AMBULATORY_CARE_PROVIDER_SITE_OTHER): Payer: Medicare Other

## 2015-09-02 DIAGNOSIS — R000 Tachycardia, unspecified: Secondary | ICD-10-CM | POA: Diagnosis not present

## 2015-09-03 DIAGNOSIS — D631 Anemia in chronic kidney disease: Secondary | ICD-10-CM | POA: Diagnosis not present

## 2015-09-03 DIAGNOSIS — N186 End stage renal disease: Secondary | ICD-10-CM | POA: Diagnosis not present

## 2015-09-03 DIAGNOSIS — D509 Iron deficiency anemia, unspecified: Secondary | ICD-10-CM | POA: Diagnosis not present

## 2015-09-03 DIAGNOSIS — N2581 Secondary hyperparathyroidism of renal origin: Secondary | ICD-10-CM | POA: Diagnosis not present

## 2015-09-03 DIAGNOSIS — E119 Type 2 diabetes mellitus without complications: Secondary | ICD-10-CM | POA: Diagnosis not present

## 2015-09-06 DIAGNOSIS — N186 End stage renal disease: Secondary | ICD-10-CM | POA: Diagnosis not present

## 2015-09-06 DIAGNOSIS — D509 Iron deficiency anemia, unspecified: Secondary | ICD-10-CM | POA: Diagnosis not present

## 2015-09-06 DIAGNOSIS — N2581 Secondary hyperparathyroidism of renal origin: Secondary | ICD-10-CM | POA: Diagnosis not present

## 2015-09-06 DIAGNOSIS — D631 Anemia in chronic kidney disease: Secondary | ICD-10-CM | POA: Diagnosis not present

## 2015-09-06 DIAGNOSIS — E119 Type 2 diabetes mellitus without complications: Secondary | ICD-10-CM | POA: Diagnosis not present

## 2015-09-08 DIAGNOSIS — E119 Type 2 diabetes mellitus without complications: Secondary | ICD-10-CM | POA: Diagnosis not present

## 2015-09-08 DIAGNOSIS — N2581 Secondary hyperparathyroidism of renal origin: Secondary | ICD-10-CM | POA: Diagnosis not present

## 2015-09-08 DIAGNOSIS — D631 Anemia in chronic kidney disease: Secondary | ICD-10-CM | POA: Diagnosis not present

## 2015-09-08 DIAGNOSIS — D509 Iron deficiency anemia, unspecified: Secondary | ICD-10-CM | POA: Diagnosis not present

## 2015-09-08 DIAGNOSIS — N186 End stage renal disease: Secondary | ICD-10-CM | POA: Diagnosis not present

## 2015-09-09 DIAGNOSIS — E1129 Type 2 diabetes mellitus with other diabetic kidney complication: Secondary | ICD-10-CM | POA: Diagnosis not present

## 2015-09-09 DIAGNOSIS — N186 End stage renal disease: Secondary | ICD-10-CM | POA: Diagnosis not present

## 2015-09-09 DIAGNOSIS — Z992 Dependence on renal dialysis: Secondary | ICD-10-CM | POA: Diagnosis not present

## 2015-09-10 DIAGNOSIS — D509 Iron deficiency anemia, unspecified: Secondary | ICD-10-CM | POA: Diagnosis not present

## 2015-09-10 DIAGNOSIS — Z23 Encounter for immunization: Secondary | ICD-10-CM | POA: Diagnosis not present

## 2015-09-10 DIAGNOSIS — N2581 Secondary hyperparathyroidism of renal origin: Secondary | ICD-10-CM | POA: Diagnosis not present

## 2015-09-10 DIAGNOSIS — N186 End stage renal disease: Secondary | ICD-10-CM | POA: Diagnosis not present

## 2015-09-10 DIAGNOSIS — E119 Type 2 diabetes mellitus without complications: Secondary | ICD-10-CM | POA: Diagnosis not present

## 2015-09-10 DIAGNOSIS — D631 Anemia in chronic kidney disease: Secondary | ICD-10-CM | POA: Diagnosis not present

## 2015-09-13 DIAGNOSIS — D631 Anemia in chronic kidney disease: Secondary | ICD-10-CM | POA: Diagnosis not present

## 2015-09-13 DIAGNOSIS — D509 Iron deficiency anemia, unspecified: Secondary | ICD-10-CM | POA: Diagnosis not present

## 2015-09-13 DIAGNOSIS — N2581 Secondary hyperparathyroidism of renal origin: Secondary | ICD-10-CM | POA: Diagnosis not present

## 2015-09-13 DIAGNOSIS — Z23 Encounter for immunization: Secondary | ICD-10-CM | POA: Diagnosis not present

## 2015-09-13 DIAGNOSIS — E119 Type 2 diabetes mellitus without complications: Secondary | ICD-10-CM | POA: Diagnosis not present

## 2015-09-13 DIAGNOSIS — N186 End stage renal disease: Secondary | ICD-10-CM | POA: Diagnosis not present

## 2015-09-14 ENCOUNTER — Ambulatory Visit: Payer: Medicare Other | Admitting: Endocrinology

## 2015-09-14 DIAGNOSIS — Z0289 Encounter for other administrative examinations: Secondary | ICD-10-CM

## 2015-09-15 ENCOUNTER — Telehealth: Payer: Self-pay | Admitting: Endocrinology

## 2015-09-15 DIAGNOSIS — E119 Type 2 diabetes mellitus without complications: Secondary | ICD-10-CM | POA: Diagnosis not present

## 2015-09-15 DIAGNOSIS — N2581 Secondary hyperparathyroidism of renal origin: Secondary | ICD-10-CM | POA: Diagnosis not present

## 2015-09-15 DIAGNOSIS — N186 End stage renal disease: Secondary | ICD-10-CM | POA: Diagnosis not present

## 2015-09-15 DIAGNOSIS — Z23 Encounter for immunization: Secondary | ICD-10-CM | POA: Diagnosis not present

## 2015-09-15 DIAGNOSIS — D631 Anemia in chronic kidney disease: Secondary | ICD-10-CM | POA: Diagnosis not present

## 2015-09-15 DIAGNOSIS — D509 Iron deficiency anemia, unspecified: Secondary | ICD-10-CM | POA: Diagnosis not present

## 2015-09-15 NOTE — Telephone Encounter (Signed)
Patient no showed today's appt. Please advise on how to follow up. °A. No follow up necessary. °B. Follow up urgent. Contact patient immediately. °C. Follow up necessary. Contact patient and schedule visit in ___ days. °D. Follow up advised. Contact patient and schedule visit in ____weeks. ° °

## 2015-09-15 NOTE — Telephone Encounter (Signed)
Please come back for a follow-up appointment in 1 month.  

## 2015-09-16 ENCOUNTER — Encounter (INDEPENDENT_AMBULATORY_CARE_PROVIDER_SITE_OTHER): Payer: Self-pay

## 2015-09-16 ENCOUNTER — Ambulatory Visit (INDEPENDENT_AMBULATORY_CARE_PROVIDER_SITE_OTHER): Payer: Medicare Other | Admitting: Physician Assistant

## 2015-09-16 VITALS — BP 110/70 | HR 121 | Ht 60.0 in | Wt 186.0 lb

## 2015-09-16 DIAGNOSIS — I420 Dilated cardiomyopathy: Secondary | ICD-10-CM

## 2015-09-16 DIAGNOSIS — I2510 Atherosclerotic heart disease of native coronary artery without angina pectoris: Secondary | ICD-10-CM

## 2015-09-16 DIAGNOSIS — I4719 Other supraventricular tachycardia: Secondary | ICD-10-CM

## 2015-09-16 DIAGNOSIS — I255 Ischemic cardiomyopathy: Secondary | ICD-10-CM

## 2015-09-16 DIAGNOSIS — I471 Supraventricular tachycardia: Secondary | ICD-10-CM | POA: Diagnosis not present

## 2015-09-16 NOTE — Telephone Encounter (Signed)
Caitlin, Could you please contact this patient to reschedule? Thanks!

## 2015-09-16 NOTE — Patient Instructions (Addendum)
Medication Instructions:   Your physician recommends that you continue on your current medications as directed. Please refer to the Current Medication list given to you today.   If you need a refill on your cardiac medications before your next appointment, please call your pharmacy.  Labwork: NONE ORDER TODAY    Testing/Procedures: NONE ORDER TODAY    Follow-Up: NEW CONSULT IN 2 WEEKS WITH DR CAMNITZ FOR EVALUATION FOR ABLATION   Any Other Special Instructions Will Be Listed Below (If Applicable).

## 2015-09-16 NOTE — Progress Notes (Signed)
Cardiology Office Note Date:  09/16/2015  Patient ID:  [name], [name] 1945/01/12, MRN [id number] PCP:  Walker Kehr, MD  Cardiologist:  Dr. Caryl Comes    Chief Complaint:  Follow up on holter findings, HR  History of Present Illness: [name] is a 70 y.o. female with history of ESRF on HD, HTN, DM, LBBB, PAT on amiodarone, CAD, admitted 10/16 with acute hypoxic respiratory failure in the setting of non-STEMI. She required urgent dialysis. Follow-up echocardiogram demonstrated worsening LV function with an EF of 35-40%. Cardiac catheterization demonstrated severe multivessel disease with diffuse calcified LAD and distal LAD disease in conjunction with new findings in the proximal D1 and mid RCA. She underwent PCI with a DES to the D1. Unsuccessful attempt was made at PCI of the proximal RCA. If the patient continued to have refractory angina, rotational atherectomy and stenting of the RCA could be considered.  She was seen by Richardson Dopp, PA 08/10/15 with c/o SOB, noted to be in ST vs ATach, his note mentions: I reviewed her EKG today with Dr. Irish Lack (DOD).  She has worsening LV function with diffuse hypokinesis on recent echocardiogram. Question if this is all tachycardia mediated. Question if her dyspnea is all related to her elevated heart rate. Her blood pressure will not tolerate further adjustments in her beta blocker. At this point, I am not certain that she would tolerate changing metoprolol to carvedilol.                       -  Increase amiodarone to 200 mg twice a day 2 weeks, then 200 mg daily                       -  Reviewed ECG with Dr. Caryl Comes >> he agreed this is ATach vs sinus tach                       -  Close follow-up in 2 weeks  2. Dyspnea - Possibly related to tachycardia and worsening LV function from tachycardia mediation. However, she does have residual CAD. If dyspnea remains despite improved heart rate with adjustments in amiodarone, consider cardiac  catheterization with an eye towards PCI of the RCA.   She was seen by myself 08/31/15 at that time feeling improved, her DOE was less, though still gets a little winded.  She is accompanied by her daughter who lives with her and she agreed at that time, she wass better.    I note that she does not take her lopressor on HD days, they confirm that  Her BP tends to dip low with HD.  She is down to 200mg  daily of the amiodarone, after a 2 week increase of 200mg  BID dosing regime at her last visit  She comes back today to be seen for Dr. Caryl Comes, she feels about the same, no worse, no rest or nocturnal SOB, no orthopnea, no CP or sensation or perception of a racing heart beat.  She has DOE that is about the same as her last visit.  Past Medical History:  Diagnosis Date  . Anemia   . Arthritis   . Atrial tachycardia ([place])    on amiod  . Cancer [facility])    colon cancer  . Cardiomyopathy- mixed   . Chronic diastolic heart failure ([place])   . Colostomy in place [facility])   . Coronary artery disease    Previously  decreased EF; echo 113 normal LV function  . GERD (gastroesophageal reflux disease)   . Glaucoma   . Hernia, incisional    abd  . History of colon cancer    history of colon cancer.  1986  . Hyperlipidemia   . Hypertension   . Hypertensive heart disease    sees Dr. Alain Marion  . LBBB (left bundle branch block)   . Mitral valve insufficiency and aortic valve insufficiency   . Obesity   . Renal failure    ESRD, Dr Dunham/Dr. Lyda Kalata.  M, W, Fr  . Stroke ([place])    2011/12  . Type II   diabetes mellitus without mention of complication, not stated as uncontrolled     Past Surgical History:  Procedure Laterality Date  . ARTERIOVENOUS GRAFT PLACEMENT  2010  . CARDIAC CATHETERIZATION    . CARDIAC CATHETERIZATION N/A 10/27/2014   Procedure: Left Heart Cath and Coronary Angiography;  Surgeon: Leonie Man, MD;  Location: [place] CV LAB;  Service: Cardiovascular;  Laterality: N/A;  .  CARDIAC CATHETERIZATION N/A 10/28/2014   Procedure: Coronary Stent Intervention;  Surgeon: Peter M Martinique, MD;  Location: [place] CV LAB;  Service: Cardiovascular;  Laterality: N/A;  . COLONOSCOPY    . COLOSTOMY  1986  . ELECTROCARDIOGRAM  04-27-06  . ESOPHAGOGASTRODUODENOSCOPY  03-18-04  . ESOPHAGOGASTRODUODENOSCOPY N/A 02/02/2013   Procedure: ESOPHAGOGASTRODUODENOSCOPY (EGD);  Surgeon: Irene Shipper, MD;  Location: [facility] ENDOSCOPY;  Service: Endoscopy;  Laterality: N/A;  . EYE SURGERY     Cataract Left  . FOOT AMPUTATION THROUGH METATARSAL  10-07-10   Right foot transmetatarsal  . INSERTION OF AHMED VALVE Right 08/20/2013   Procedure: INSERTION OF AHMED VALVE WITH Mitomycin C application;  Surgeon: Marylynn Pearson, MD;  Location: [place];  Service: Ophthalmology;  Laterality: Right;  . INSERTION OF AHMED VALVE Left 07/22/2014   Procedure: INSERTION OF AHMED VALVE WITH [place];  Surgeon: Marylynn Pearson, MD;  Location: [place];  Service: Ophthalmology;  Laterality: Left;  . left heart catheterization and right heart catheterization  12-10   R. heart cath showed elevated left and right heart filling pressures w/ pulmonary artery pressure elevated mildly out of proportion to the wedge. The left heart cath showed diffuse distal vessel disease as well as a 75% stenosis in the mid circumflex w/ a 90% stenosis of the ostial first obtuse marginal. These lesions were in close proximity. there was a 60-70%mild RCA stenosis.   [name] MITOMYCIN C APPLICATION Left [id number]   Procedure: MITOMYCIN C APPLICATION;  Surgeon: Marylynn Pearson, MD;  Location: [place];  Service: Ophthalmology;  Laterality: Left;  . PARS PLANA VITRECTOMY  11/29/2011   Procedure: PARS PLANA VITRECTOMY WITH 23 GAUGE;  Surgeon: Adonis Brook, MD;  Location: [place];  Service: Ophthalmology;  Laterality: Right;  Right Eye 23 ga vitrectomy with membrane peel  . PARS PLANA VITRECTOMY Left 02/28/2012   Procedure: PARS PLANA VITRECTOMY WITH 23 GAUGE;  Surgeon:  Adonis Brook, MD;  Location: [place];  Service: Ophthalmology;  Laterality: Left;  . placement of new left forearm arteriovenous graft  03-11-08  . REVISION UROSTOMY CUTANEOUS    . VAGINAL HYSTERECTOMY      Current Outpatient Prescriptions  Medication Sig Dispense Refill  . acetaminophen (TYLENOL) 500 MG tablet Take 500 mg by mouth every 6 (six) hours as needed (pain).    [name] amiodarone (PACERONE) 200 MG tablet Take 1 tablet (200 mg total) by mouth daily. 90 tablet 3  .  aspirin 81 MG tablet Take 1 tablet (81 mg total) by mouth daily. 30 tablet 3  . BRILINTA 90 MG TABS tablet TAKE 1 TABLET BY MOUTH TWICE DAILY 60 tablet 9  . calcium acetate (PHOSLO) 667 MG capsule Take 1,334-2,001 mg by mouth See admin instructions. Take 2001mg  three times daily with meals and Take 1334mg   with snacks.    . dorzolamide-timolol (COSOPT) 22.3-6.8 MG/ML ophthalmic solution Place 1 drop into the left eye 4 (four) times daily.  12  . Fluticasone-Salmeterol (ADVAIR) 250-50 MCG/DOSE AEPB Inhale 1 puff into the lungs daily as needed (shortness of breath).     [name] glucose blood (ONE TOUCH ULTRA TEST) test strip Check blood sugar 2 time per day. DX code E11.9 200 each 2  . metoprolol (LOPRESSOR) 50 MG tablet Take 1 tablet (50 mg total) by mouth 2 (two) times daily. Do not take this medication on dialysis days (Monday, Wednesday, Friday). 60 tablet 0  . nitroGLYCERIN (NITROSTAT) 0.4 MG SL tablet Place 1 tablet (0.4 mg total) under the tongue every 5 (five) minutes as needed for chest pain. 30 tablet 3  . prednisoLONE acetate (PRED FORTE) 1 % ophthalmic suspension Place 1 drop into the left eye 4 (four) times daily.    . repaglinide (PRANDIN) 0.5 MG tablet Take 1 tablet (0.5 mg total) by mouth 3 (three) times daily before meals. 90 tablet 11  . simvastatin (ZOCOR) 10 MG tablet Take 1 tablet (10 mg total) by mouth every evening. 90 tablet 3   No current facility-administered medications for this visit.    Facility-Administered  Medications Ordered in Other Visits  Medication Dose Route Frequency Provider Last Rate Last Dose  . mitoMYcin (MUTAMYCIN) Injection Use in OR only (0.4 mg/ml)  0.5 mL Left Eye Once Marylynn Pearson, MD        Allergies:   Ace inhibitors; Eggs or egg-derived products; Enalapril; Lisinopril; and Omnipaque [iohexol]   Social History:  The patient  reports that she has never smoked. She has never used smokeless tobacco. She reports that she does not drink alcohol or use drugs.   Family History:  The patient's family history includes Cancer in her sister; Coronary artery disease in her mother; Diabetes in her father; Hypertension in her father, mother, and other.  ROS:  Please see the history of present illness.    All other systems are reviewed and otherwise negative.   PHYSICAL EXAM:  VS:  BP 110/70   Pulse (!) 121   Ht 5' (1.524 m)   Wt 186 lb (84.4 kg)   BMI 36.33 kg/m  BMI: Body mass index is 36.33 kg/m. Well nourished, well developed, in no acute distress, appears older then her age 14: normocephalic, atraumatic  Neck: no JVD, carotid bruits or masses Cardiac:   RRR; tachycardic, no significant murmurs, no rubs, or gallops Lungs:  clear to auscultation bilaterally, no wheezing, rhonchi or rales  Abd: soft, nontender MS: no deformity or atrophy Ext: trace LE edema  Skin: warm and dry, no rash Neuro:  No gross deficits appreciated Psych: euthymic mood, full affect   EKG:  Done today and reviewed by myself and Dr. Caryl Comes is tachycardic,  rightward axis, , IVCD, 121 bpm   09/02/15: Holter monitor Avg HR101, min 79, max 133 Reviewed by myself with Dr. Caryl Comes She has times of clear SR, 70's, morphology of the tachycardia appears the same, we see that the tachycardia is stopped on 2 occassions (by the available strips to review) by  PVC's each time avg HR 101, min 79, max133   Echo 08/03/15 Moderate LVH, EF 25-30%, diffuse HK, mild AI, severe MAC, moderate MR, moderate LAE, mild  TR  LHC 10/28/14 LAD: Heavily calcified throughout, proximal 30%, distal 90%, D1 95%, D2 50%, 60%, lateral D2 80% LCx: Mid 75%, OM1 80% ostial, OM 260% RCA: Proximal 95%, RPDA ostial occluded, posterior Atrio ventricular branch 80% PCI: 2.25 x 12 mm Promus Premier DES to the D1; unsuccessful PCI of the proximal RCA 1. Successful stenting of the first diagonal with a DES 2. Unsuccessful stenting of the mid RCA due to inability to cross with a balloon. This despite good wire, guide, and Guideliner support. Plan: continue DAPT for one year. Optimize antianginal therapy. If she continues to have significant angina I would consider rotational atherectomy and stenting of the RCA.   Echo 10/25/14  Moderate LVH, EF 35-40%, mild AI, MAC, moderate MR, mild LAE, PASP 56 mmHg   Recent Labs: 10/23/2014: B Natriuretic Peptide 1,300.0 10/29/2014: Hemoglobin 10.7; Platelets 330 08/10/2015: TSH 1.06 08/31/2015: ALT 10; BUN 27; Creat 4.78; Potassium 4.8; Sodium 141  10/25/2014: Cholesterol 166; HDL 27; LDL Cholesterol 98; Total CHOL/HDL Ratio 6.1; Triglycerides 205; VLDL 41   Estimated Creatinine Clearance: 10.7 mL/min (by C-G formula based on SCr of 4.78 mg/dL).   Wt Readings from Last 3 Encounters:  09/16/15 186 lb (84.4 kg)  08/31/15 188 lb (85.3 kg)  08/10/15 190 lb 12.8 oz (86.5 kg)     Other studies reviewed: Additional studies/records reviewed today include: summarized above  ASSESSMENT AND PLAN:  1. ATach     Her holter monitor revealed that the tachycardia is stopped by PVCs     BP stable     Appears asymptomatic     ?etiology of her DOE/cardiomyopathy Discussed with the patient and her daughter possibility of EP/ablation and recommend she be evaluated by Dr. Curt Bears to see if she would be a candidate, they are agreeable to see him to discuss further  2. DOE     no rest SOB, no c/o PND or orthopnea, c/w same DOE    fluid management with HD  3. CAD      no CP     On ASA,  brilinta, BB, statin  4. CM, EF 25-30%     On BB (limited with BP)     Is new, ? Etiology tachycardia mediated    Disposition: Referral to Dr. Curt Bears to discuss possible ablation for her atrial tachycardia.    Current medicines are reviewed at length with the patient today.  The patient did not have any concerns regarding medicines.  Haywood Lasso, PA-C 09/16/2015 3:18 PM     [address] [place] [address] [phone] (office)  [phone] (fax)

## 2015-09-16 NOTE — Telephone Encounter (Signed)
Called PT, mailbox is full.

## 2015-09-17 DIAGNOSIS — N186 End stage renal disease: Secondary | ICD-10-CM | POA: Diagnosis not present

## 2015-09-17 DIAGNOSIS — D509 Iron deficiency anemia, unspecified: Secondary | ICD-10-CM | POA: Diagnosis not present

## 2015-09-17 DIAGNOSIS — D631 Anemia in chronic kidney disease: Secondary | ICD-10-CM | POA: Diagnosis not present

## 2015-09-17 DIAGNOSIS — E119 Type 2 diabetes mellitus without complications: Secondary | ICD-10-CM | POA: Diagnosis not present

## 2015-09-17 DIAGNOSIS — Z23 Encounter for immunization: Secondary | ICD-10-CM | POA: Diagnosis not present

## 2015-09-17 DIAGNOSIS — N2581 Secondary hyperparathyroidism of renal origin: Secondary | ICD-10-CM | POA: Diagnosis not present

## 2015-09-20 DIAGNOSIS — D631 Anemia in chronic kidney disease: Secondary | ICD-10-CM | POA: Diagnosis not present

## 2015-09-20 DIAGNOSIS — N2581 Secondary hyperparathyroidism of renal origin: Secondary | ICD-10-CM | POA: Diagnosis not present

## 2015-09-20 DIAGNOSIS — Z23 Encounter for immunization: Secondary | ICD-10-CM | POA: Diagnosis not present

## 2015-09-20 DIAGNOSIS — D509 Iron deficiency anemia, unspecified: Secondary | ICD-10-CM | POA: Diagnosis not present

## 2015-09-20 DIAGNOSIS — N186 End stage renal disease: Secondary | ICD-10-CM | POA: Diagnosis not present

## 2015-09-20 DIAGNOSIS — E119 Type 2 diabetes mellitus without complications: Secondary | ICD-10-CM | POA: Diagnosis not present

## 2015-09-21 ENCOUNTER — Other Ambulatory Visit: Payer: Self-pay | Admitting: Endocrinology

## 2015-09-21 ENCOUNTER — Other Ambulatory Visit (HOSPITAL_COMMUNITY): Payer: Medicare Other

## 2015-09-22 DIAGNOSIS — Z23 Encounter for immunization: Secondary | ICD-10-CM | POA: Diagnosis not present

## 2015-09-22 DIAGNOSIS — N186 End stage renal disease: Secondary | ICD-10-CM | POA: Diagnosis not present

## 2015-09-22 DIAGNOSIS — E119 Type 2 diabetes mellitus without complications: Secondary | ICD-10-CM | POA: Diagnosis not present

## 2015-09-22 DIAGNOSIS — N2581 Secondary hyperparathyroidism of renal origin: Secondary | ICD-10-CM | POA: Diagnosis not present

## 2015-09-22 DIAGNOSIS — D631 Anemia in chronic kidney disease: Secondary | ICD-10-CM | POA: Diagnosis not present

## 2015-09-22 DIAGNOSIS — D509 Iron deficiency anemia, unspecified: Secondary | ICD-10-CM | POA: Diagnosis not present

## 2015-09-24 DIAGNOSIS — N2581 Secondary hyperparathyroidism of renal origin: Secondary | ICD-10-CM | POA: Diagnosis not present

## 2015-09-24 DIAGNOSIS — N186 End stage renal disease: Secondary | ICD-10-CM | POA: Diagnosis not present

## 2015-09-24 DIAGNOSIS — D631 Anemia in chronic kidney disease: Secondary | ICD-10-CM | POA: Diagnosis not present

## 2015-09-24 DIAGNOSIS — D509 Iron deficiency anemia, unspecified: Secondary | ICD-10-CM | POA: Diagnosis not present

## 2015-09-24 DIAGNOSIS — E119 Type 2 diabetes mellitus without complications: Secondary | ICD-10-CM | POA: Diagnosis not present

## 2015-09-24 DIAGNOSIS — Z23 Encounter for immunization: Secondary | ICD-10-CM | POA: Diagnosis not present

## 2015-09-27 DIAGNOSIS — N2581 Secondary hyperparathyroidism of renal origin: Secondary | ICD-10-CM | POA: Diagnosis not present

## 2015-09-27 DIAGNOSIS — N186 End stage renal disease: Secondary | ICD-10-CM | POA: Diagnosis not present

## 2015-09-27 DIAGNOSIS — E119 Type 2 diabetes mellitus without complications: Secondary | ICD-10-CM | POA: Diagnosis not present

## 2015-09-27 DIAGNOSIS — D509 Iron deficiency anemia, unspecified: Secondary | ICD-10-CM | POA: Diagnosis not present

## 2015-09-27 DIAGNOSIS — D631 Anemia in chronic kidney disease: Secondary | ICD-10-CM | POA: Diagnosis not present

## 2015-09-27 DIAGNOSIS — Z23 Encounter for immunization: Secondary | ICD-10-CM | POA: Diagnosis not present

## 2015-09-28 ENCOUNTER — Encounter (INDEPENDENT_AMBULATORY_CARE_PROVIDER_SITE_OTHER): Payer: Self-pay

## 2015-09-28 ENCOUNTER — Ambulatory Visit (INDEPENDENT_AMBULATORY_CARE_PROVIDER_SITE_OTHER): Payer: Medicare Other | Admitting: Cardiology

## 2015-09-28 ENCOUNTER — Encounter: Payer: Self-pay | Admitting: Cardiology

## 2015-09-28 VITALS — BP 126/76 | HR 115 | Ht 60.0 in | Wt 185.0 lb

## 2015-09-28 DIAGNOSIS — I255 Ischemic cardiomyopathy: Secondary | ICD-10-CM | POA: Diagnosis not present

## 2015-09-28 DIAGNOSIS — I471 Supraventricular tachycardia: Secondary | ICD-10-CM | POA: Diagnosis not present

## 2015-09-28 NOTE — Progress Notes (Signed)
Electrophysiology Office Note   Date:  09/28/2015   ID:  [name], [name] September 12, 1945, MRN [id number]  PCP:  Walker Kehr, MD  Primary Electrophysiologist: Caryl Comes    Chief Complaint  Patient presents with  . New Patient (Initial Visit)    Eval for ablation  . Shortness of Breath     History of Present Illness: [name] is a 70 y.o. female who presents today for electrophysiology evaluation.   history of ESRF on HD, HTN, DM, LBBB, PAT on amiodarone, CAD, admitted 10/16with acute hypoxic respiratory failure in the setting of non-STEMI. She required urgent dialysis. Follow-up echocardiogram demonstrated worsening LV function with an EF of 35-40%. Cardiac catheterization demonstrated severe multivessel disease with diffuse calcified LAD and distal LAD disease in conjunction with new findings in the proximal D1 and mid RCA. Sheunderwent PCI with a DES to the D1. Unsuccessful attempt was made at PCI of the proximal RCA.  Was seen in clinic previously for dyspnea and found to be in AT vs ST. Amiodarone was increased to 200 mg BID.   Today, she denies symptoms of palpitations, chest pain, orthopnea, PND, lower extremity edema, claudication, dizziness, presyncope, syncope, bleeding, or neurologic sequela. The patient is tolerating medications without difficulties.  Her main complaint is of continued shortness of breath. She says that she does not have issues lying flat or waking up short of breath, but does have significant shortness of breath when she is up and walking around. She does not have associated chest pain.   Past Medical History:  Diagnosis Date  . Anemia   . Arthritis   . Atrial tachycardia ([place])    on amiod  . Cancer [facility])    colon cancer  . Cardiomyopathy- mixed   . Chronic diastolic heart failure ([place])   . Colostomy in place [facility])   . Coronary artery disease    Previously decreased EF; echo 113 normal LV function  . GERD (gastroesophageal reflux disease)     . Glaucoma   . Hernia, incisional    abd  . History of colon cancer    history of colon cancer.  1986  . Hyperlipidemia   . Hypertension   . Hypertensive heart disease    sees Dr. Alain Marion  . LBBB (left bundle branch block)   . Mitral valve insufficiency and aortic valve insufficiency   . Obesity   . Renal failure    ESRD, Dr Dunham/Dr. Lyda Kalata.  M, W, Fr  . Stroke ([place])    2011/12  . Type II   diabetes mellitus without mention of complication, not stated as uncontrolled    Past Surgical History:  Procedure Laterality Date  . ARTERIOVENOUS GRAFT PLACEMENT  2010  . CARDIAC CATHETERIZATION    . CARDIAC CATHETERIZATION N/A 10/27/2014   Procedure: Left Heart Cath and Coronary Angiography;  Surgeon: Leonie Man, MD;  Location: [place] CV LAB;  Service: Cardiovascular;  Laterality: N/A;  . CARDIAC CATHETERIZATION N/A 10/28/2014   Procedure: Coronary Stent Intervention;  Surgeon: Peter M Martinique, MD;  Location: [place] CV LAB;  Service: Cardiovascular;  Laterality: N/A;  . COLONOSCOPY    . COLOSTOMY  1986  . ELECTROCARDIOGRAM  04-27-06  . ESOPHAGOGASTRODUODENOSCOPY  03-18-04  . ESOPHAGOGASTRODUODENOSCOPY N/A 02/02/2013   Procedure: ESOPHAGOGASTRODUODENOSCOPY (EGD);  Surgeon: Irene Shipper, MD;  Location: [facility] ENDOSCOPY;  Service: Endoscopy;  Laterality: N/A;  . EYE SURGERY     Cataract Left  . FOOT AMPUTATION THROUGH METATARSAL  10-07-10  Right foot transmetatarsal  . INSERTION OF AHMED VALVE Right 08/20/2013   Procedure: INSERTION OF AHMED VALVE WITH Mitomycin C application;  Surgeon: Marylynn Pearson, MD;  Location: [place];  Service: Ophthalmology;  Laterality: Right;  . INSERTION OF AHMED VALVE Left 07/22/2014   Procedure: INSERTION OF AHMED VALVE WITH [place];  Surgeon: Marylynn Pearson, MD;  Location: [place];  Service: Ophthalmology;  Laterality: Left;  . left heart catheterization and right heart catheterization  12-10   R. heart cath showed elevated left and right heart  filling pressures w/ pulmonary artery pressure elevated mildly out of proportion to the wedge. The left heart cath showed diffuse distal vessel disease as well as a 75% stenosis in the mid circumflex w/ a 90% stenosis of the ostial first obtuse marginal. These lesions were in close proximity. there was a 60-70%mild RCA stenosis.   [name] MITOMYCIN C APPLICATION Left [id number]   Procedure: MITOMYCIN C APPLICATION;  Surgeon: Marylynn Pearson, MD;  Location: [place];  Service: Ophthalmology;  Laterality: Left;  . PARS PLANA VITRECTOMY  11/29/2011   Procedure: PARS PLANA VITRECTOMY WITH 23 GAUGE;  Surgeon: Adonis Brook, MD;  Location: [place];  Service: Ophthalmology;  Laterality: Right;  Right Eye 23 ga vitrectomy with membrane peel  . PARS PLANA VITRECTOMY Left 02/28/2012   Procedure: PARS PLANA VITRECTOMY WITH 23 GAUGE;  Surgeon: Adonis Brook, MD;  Location: [place];  Service: Ophthalmology;  Laterality: Left;  . placement of new left forearm arteriovenous graft  03-11-08  . REVISION UROSTOMY CUTANEOUS    . VAGINAL HYSTERECTOMY       Current Outpatient Prescriptions  Medication Sig Dispense Refill  . acetaminophen (TYLENOL) 500 MG tablet Take 500 mg by mouth every 6 (six) hours as needed (pain).    [name] amiodarone (PACERONE) 200 MG tablet Take 1 tablet (200 mg total) by mouth daily. 90 tablet 3  . aspirin 81 MG tablet Take 1 tablet (81 mg total) by mouth daily. 30 tablet 3  . BRILINTA 90 MG TABS tablet TAKE 1 TABLET BY MOUTH TWICE DAILY 60 tablet 9  . calcium acetate (PHOSLO) 667 MG capsule Take 1,334-2,001 mg by mouth See admin instructions. Take 2001mg  three times daily with meals and Take 1334mg   with snacks.    . Fluticasone-Salmeterol (ADVAIR) 250-50 MCG/DOSE AEPB Inhale 1 puff into the lungs daily as needed (shortness of breath).     . metoprolol (LOPRESSOR) 50 MG tablet Take 1 tablet (50 mg total) by mouth 2 (two) times daily. Do not take this medication on dialysis days (Monday, Wednesday, Friday). 60 tablet  0  . nitroGLYCERIN (NITROSTAT) 0.4 MG SL tablet Place 1 tablet (0.4 mg total) under the tongue every 5 (five) minutes as needed for chest pain. 30 tablet 3  . ONE TOUCH ULTRA TEST test strip USE TO CHECK BLOOD SUGAR TWICE DAILY 180 each 3  . prednisoLONE acetate (PRED FORTE) 1 % ophthalmic suspension Place 1 drop into the left eye 4 (four) times daily.    . repaglinide (PRANDIN) 0.5 MG tablet Take 1 tablet (0.5 mg total) by mouth 3 (three) times daily before meals. 90 tablet 11  . simvastatin (ZOCOR) 10 MG tablet Take 1 tablet (10 mg total) by mouth every evening. 90 tablet 3   No current facility-administered medications for this visit.    Facility-Administered Medications Ordered in Other Visits  Medication Dose Route Frequency Provider Last Rate Last Dose  . mitoMYcin (MUTAMYCIN) Injection Use in OR only (0.4 mg/ml)  0.5 mL Left Eye Once Marylynn Pearson, MD        Allergies:   Ace inhibitors; Eggs or egg-derived products; Enalapril; Lisinopril; and Omnipaque [iohexol]   Social History:  The patient  reports that she has never smoked. She has never used smokeless tobacco. She reports that she does not drink alcohol or use drugs.   Family History:  The patient's family history includes Cancer in her sister; Coronary artery disease in her mother; Diabetes in her father; Hypertension in her father, mother, and other.    ROS:  Please see the history of present illness.   Otherwise, review of systems is positive for appetite change, visual disturbance.   All other systems are reviewed and negative.    PHYSICAL EXAM: VS:  BP 126/76   Pulse (!) 115   Ht 5' (1.524 m)   Wt 185 lb (83.9 kg)   BMI 36.13 kg/m  , BMI Body mass index is 36.13 kg/m. GEN: Well nourished, well developed, in no acute distress  HEENT: normal  Neck: no JVD, carotid bruits, or masses Cardiac: regular, tachycardic; no murmurs, rubs, or gallops,no edema  Respiratory:  clear to auscultation bilaterally, normal work of  breathing GI: soft, nontender, nondistended, + BS MS: no deformity or atrophy  Skin: warm and dry Neuro:  Strength and sensation are intact Psych: euthymic mood, full affect  EKG:  EKG is ordered today. Personal review of the ekg ordered shows atrial tachycardia, atypical LBBB, 1 degree AV block  Recent Labs: 10/23/2014: B Natriuretic Peptide 1,300.0 10/29/2014: Hemoglobin 10.7; Platelets 330 08/10/2015: TSH 1.06 08/31/2015: ALT 10; BUN 27; Creat 4.78; Potassium 4.8; Sodium 141    Lipid Panel     Component Value Date/Time   CHOL 166 10/25/2014 0545   TRIG 205 (H) 10/25/2014 0545   HDL 27 (L) 10/25/2014 0545   CHOLHDL 6.1 10/25/2014 0545   VLDL 41 (H) 10/25/2014 0545   LDLCALC 98 10/25/2014 0545     Wt Readings from Last 3 Encounters:  09/28/15 185 lb (83.9 kg)  09/16/15 186 lb (84.4 kg)  08/31/15 188 lb (85.3 kg)      Other studies Reviewed: Additional studies/ records that were reviewed today include: TTE 08/03/15 Review of the above records today demonstrates:  - Left ventricle: The cavity size was normal. Wall thickness was   increased in a pattern of moderate LVH. Systolic function was   severely reduced. The estimated ejection fraction was in the   range of 25% to 30%. Diffuse hypokinesis. - Ventricular septum: Septal motion showed abnormal function and   dyssynergy. - Aortic valve: There was mild regurgitation. - Mitral valve: Severely calcified annulus. Mildly thickened   leaflets . There was moderate regurgitation directed posteriorly. - Left atrium: The atrium was moderately dilated.   Anterior-posterior dimension: 51 mm. - Tricuspid valve: There was mild regurgitation.  Personal review of the Holter monitor: Dominant Rhythm  Sinus     Mean HR 101 Range HR 79-133   Total beats 148K PVC 19 PAC -  Tachycardia yes   Pauses none    ASSESSMENT AND PLAN:  1.  Atrial tachycardia: possibly causing cardiomyopathy has has been tachycardic for an  extended period of time.  Discussed the possibility of ablation with the patient.  Risks and benefits discussed.  Risks include but are not limited to bleeding, tamponade, heart block and stroke. I have told the patient that I Rainn Zupko discuss this case with her primary EP and get back to her  about ablation.  2. DOE: It is likely that her dyspnea on exertion is multifactorial. She does have a history of heart failure with a low EF, but also has an atrial tachycardia with rates consistently in the 1 teens. It is difficult to say whether her heart failure is a main contributor of her symptoms. She does not appear volume overloaded on exam, and does not have any PND or orthopnea.   3. CAD: No current chest pain   4. Cardiomyopathy: Currently on beta blockers. Not on ACE inhibitor due to renal function    Current medicines are reviewed at length with the patient today.   The patient does not have concerns regarding her medicines.  The following changes were made today:  none  Labs/ tests ordered today include:  No orders of the defined types were placed in this encounter.    Disposition:   FU with Elvie Maines 3 months    Signed, Gari Hartsell Meredith Leeds, MD  09/28/2015 2:07 PM     [place] [address] [phone] (office) [phone] (fax)

## 2015-09-28 NOTE — Patient Instructions (Signed)
Medication Instructions:  Your physician recommends that you continue on your current medications as directed. Please refer to the Current Medication list given to you today.  Labwork: None ordered  Testing/Procedures: None ordered  Follow-Up: To be determined.   Dr. Curt Bears will discuss case with colleagues and we will be in touch with you.  If you need a refill on your cardiac medications before your next appointment, please call your pharmacy.  Thank you for choosing [facility]!!   Trinidad Curet, RN [phone]  Any Other Special Instructions Will Be Listed Below (If Applicable). Cardiac Ablation Cardiac ablation is a procedure to disable a small amount of heart tissue in very specific places. The heart has many electrical connections. Sometimes these connections are abnormal and can cause the heart to beat very fast or irregularly. By disabling some of the problem areas, heart rhythm can be improved or made normal. Ablation is done for people who:   Have Wolff-Parkinson-White syndrome.   Have other fast heart rhythms (tachycardia).   Have taken medicines for an abnormal heart rhythm (arrhythmia) that resulted in:   No success.   Side effects.   May have a high-risk heartbeat that could result in death.  LET [facility] CARE PROVIDER KNOW ABOUT:   Any allergies you have or any previous reactions you have had to X-ray dye, food (such as seafood), medicine, or tape.   All medicines you are taking, including vitamins, herbs, eye drops, creams, and over-the-counter medicines.   Previous problems you or members of your family have had with the use of anesthetics.   Any blood disorders you have.   Previous surgeries or procedures (such as a kidney transplant) you have had.   Medical conditions you have (such as kidney failure).  RISKS AND COMPLICATIONS Generally, cardiac ablation is a safe procedure. However, problems can occur and include:    Increased risk of cancer. Depending on how long it takes to do the ablation, the dose of radiation can be high.  Bruising and bleeding where a thin, flexible tube (catheter) was inserted during the procedure.   Bleeding into the chest, especially into the sac that surrounds the heart (serious).  Need for a permanent pacemaker if the normal electrical system is damaged.   The procedure may not be fully effective, and this may not be recognized for months. Repeat ablation procedures are sometimes required. BEFORE THE PROCEDURE   Follow any instructions from your health care provider regarding eating and drinking before the procedure.   Take your medicines as directed at regular times with water, unless instructed otherwise by your health care provider. If you are taking diabetes medicine, including insulin, ask how you are to take it and if there are any special instructions you should follow. It is common to adjust insulin dosing the day of the ablation.  PROCEDURE  An ablation is usually performed in a catheterization laboratory with the guidance of fluoroscopy. Fluoroscopy is a type of X-ray that helps your health care provider see images of your heart during the procedure.   An ablation is a minimally invasive procedure. This means a small cut (incision) is made in either your neck or groin. Your health care provider will decide where to make the incision based on your medical history and physical exam.  An IV tube will be started before the procedure begins. You will be given an anesthetic or medicine to help you relax (sedative).  The skin on your neck or groin will be  numbed. A needle will be inserted into a large vein in your neck or groin and catheters will be threaded to your heart.  A special dye that shows up on fluoroscopy pictures may be injected through the catheter. The dye helps your health care provider see the area of the heart that needs treatment.  The catheter  has electrodes on the tip. When the area of heart tissue that is causing the arrhythmia is found, the catheter tip will send an electrical current to the area and "scar" the tissue. Three types of energy can be used to ablate the heart tissue:   Heat (radiofrequency energy).   Laser energy.   Extreme cold (cryoablation).   When the area of the heart has been ablated, the catheter will be taken out. Pressure will be held on the insertion site. This will help the insertion site clot and keep it from bleeding. A bandage will be placed on the insertion site.  AFTER THE PROCEDURE   After the procedure, you will be taken to a recovery area where your vital signs (blood pressure, heart rate, and breathing) will be monitored. The insertion site will also be monitored for bleeding.   You will need to lie still for 4-6 hours. This is to ensure you do not bleed from the catheter insertion site.    This information is not intended to replace advice given to you by your health care provider. Make sure you discuss any questions you have with your health care provider.   Document Released: 05/14/2008 Document Revised: 01/16/2014 Document Reviewed: 05/20/2012 Elsevier Interactive Patient Education [employer].

## 2015-09-29 DIAGNOSIS — Z23 Encounter for immunization: Secondary | ICD-10-CM | POA: Diagnosis not present

## 2015-09-29 DIAGNOSIS — E119 Type 2 diabetes mellitus without complications: Secondary | ICD-10-CM | POA: Diagnosis not present

## 2015-09-29 DIAGNOSIS — N2581 Secondary hyperparathyroidism of renal origin: Secondary | ICD-10-CM | POA: Diagnosis not present

## 2015-09-29 DIAGNOSIS — D631 Anemia in chronic kidney disease: Secondary | ICD-10-CM | POA: Diagnosis not present

## 2015-09-29 DIAGNOSIS — D509 Iron deficiency anemia, unspecified: Secondary | ICD-10-CM | POA: Diagnosis not present

## 2015-09-29 DIAGNOSIS — N186 End stage renal disease: Secondary | ICD-10-CM | POA: Diagnosis not present

## 2015-10-01 DIAGNOSIS — N186 End stage renal disease: Secondary | ICD-10-CM | POA: Diagnosis not present

## 2015-10-01 DIAGNOSIS — N2581 Secondary hyperparathyroidism of renal origin: Secondary | ICD-10-CM | POA: Diagnosis not present

## 2015-10-01 DIAGNOSIS — Z23 Encounter for immunization: Secondary | ICD-10-CM | POA: Diagnosis not present

## 2015-10-01 DIAGNOSIS — D509 Iron deficiency anemia, unspecified: Secondary | ICD-10-CM | POA: Diagnosis not present

## 2015-10-01 DIAGNOSIS — E119 Type 2 diabetes mellitus without complications: Secondary | ICD-10-CM | POA: Diagnosis not present

## 2015-10-01 DIAGNOSIS — D631 Anemia in chronic kidney disease: Secondary | ICD-10-CM | POA: Diagnosis not present

## 2015-10-04 DIAGNOSIS — E119 Type 2 diabetes mellitus without complications: Secondary | ICD-10-CM | POA: Diagnosis not present

## 2015-10-04 DIAGNOSIS — N186 End stage renal disease: Secondary | ICD-10-CM | POA: Diagnosis not present

## 2015-10-04 DIAGNOSIS — N2581 Secondary hyperparathyroidism of renal origin: Secondary | ICD-10-CM | POA: Diagnosis not present

## 2015-10-04 DIAGNOSIS — Z23 Encounter for immunization: Secondary | ICD-10-CM | POA: Diagnosis not present

## 2015-10-04 DIAGNOSIS — D631 Anemia in chronic kidney disease: Secondary | ICD-10-CM | POA: Diagnosis not present

## 2015-10-04 DIAGNOSIS — D509 Iron deficiency anemia, unspecified: Secondary | ICD-10-CM | POA: Diagnosis not present

## 2015-10-05 ENCOUNTER — Telehealth: Payer: Self-pay | Admitting: Cardiology

## 2015-10-05 NOTE — Telephone Encounter (Signed)
New message    Pt called stating that 2 week ago when she left the doc's office she was told that someone would call and set up an appt for her procedure. Please calll.

## 2015-10-05 NOTE — Telephone Encounter (Signed)
Advised patient ok to schedule ablation.  Offered patient several different Monday dates.  She states she cannot do because she has HD on Mondays.  Informed her that I would discuss with Camnitz to confirm ok to arrange ablation and then have HD in hospital afterwards.  Patient is agreeable and knows I will call her once reviewed with physician.

## 2015-10-06 DIAGNOSIS — E119 Type 2 diabetes mellitus without complications: Secondary | ICD-10-CM | POA: Diagnosis not present

## 2015-10-06 DIAGNOSIS — D509 Iron deficiency anemia, unspecified: Secondary | ICD-10-CM | POA: Diagnosis not present

## 2015-10-06 DIAGNOSIS — N186 End stage renal disease: Secondary | ICD-10-CM | POA: Diagnosis not present

## 2015-10-06 DIAGNOSIS — Z23 Encounter for immunization: Secondary | ICD-10-CM | POA: Diagnosis not present

## 2015-10-06 DIAGNOSIS — N2581 Secondary hyperparathyroidism of renal origin: Secondary | ICD-10-CM | POA: Diagnosis not present

## 2015-10-06 DIAGNOSIS — D631 Anemia in chronic kidney disease: Secondary | ICD-10-CM | POA: Diagnosis not present

## 2015-10-08 DIAGNOSIS — D509 Iron deficiency anemia, unspecified: Secondary | ICD-10-CM | POA: Diagnosis not present

## 2015-10-08 DIAGNOSIS — N2581 Secondary hyperparathyroidism of renal origin: Secondary | ICD-10-CM | POA: Diagnosis not present

## 2015-10-08 DIAGNOSIS — Z23 Encounter for immunization: Secondary | ICD-10-CM | POA: Diagnosis not present

## 2015-10-08 DIAGNOSIS — N186 End stage renal disease: Secondary | ICD-10-CM | POA: Diagnosis not present

## 2015-10-08 DIAGNOSIS — E119 Type 2 diabetes mellitus without complications: Secondary | ICD-10-CM | POA: Diagnosis not present

## 2015-10-08 DIAGNOSIS — D631 Anemia in chronic kidney disease: Secondary | ICD-10-CM | POA: Diagnosis not present

## 2015-10-09 DIAGNOSIS — Z992 Dependence on renal dialysis: Secondary | ICD-10-CM | POA: Diagnosis not present

## 2015-10-09 DIAGNOSIS — N186 End stage renal disease: Secondary | ICD-10-CM | POA: Diagnosis not present

## 2015-10-09 DIAGNOSIS — E1129 Type 2 diabetes mellitus with other diabetic kidney complication: Secondary | ICD-10-CM | POA: Diagnosis not present

## 2015-10-11 DIAGNOSIS — D509 Iron deficiency anemia, unspecified: Secondary | ICD-10-CM | POA: Diagnosis not present

## 2015-10-11 DIAGNOSIS — E1129 Type 2 diabetes mellitus with other diabetic kidney complication: Secondary | ICD-10-CM | POA: Diagnosis not present

## 2015-10-11 DIAGNOSIS — N2581 Secondary hyperparathyroidism of renal origin: Secondary | ICD-10-CM | POA: Diagnosis not present

## 2015-10-11 DIAGNOSIS — N186 End stage renal disease: Secondary | ICD-10-CM | POA: Diagnosis not present

## 2015-10-11 DIAGNOSIS — D631 Anemia in chronic kidney disease: Secondary | ICD-10-CM | POA: Diagnosis not present

## 2015-10-12 DIAGNOSIS — N3020 Other chronic cystitis without hematuria: Secondary | ICD-10-CM | POA: Diagnosis not present

## 2015-10-13 DIAGNOSIS — D509 Iron deficiency anemia, unspecified: Secondary | ICD-10-CM | POA: Diagnosis not present

## 2015-10-13 DIAGNOSIS — D631 Anemia in chronic kidney disease: Secondary | ICD-10-CM | POA: Diagnosis not present

## 2015-10-13 DIAGNOSIS — N186 End stage renal disease: Secondary | ICD-10-CM | POA: Diagnosis not present

## 2015-10-13 DIAGNOSIS — E1129 Type 2 diabetes mellitus with other diabetic kidney complication: Secondary | ICD-10-CM | POA: Diagnosis not present

## 2015-10-13 DIAGNOSIS — N2581 Secondary hyperparathyroidism of renal origin: Secondary | ICD-10-CM | POA: Diagnosis not present

## 2015-10-15 DIAGNOSIS — D631 Anemia in chronic kidney disease: Secondary | ICD-10-CM | POA: Diagnosis not present

## 2015-10-15 DIAGNOSIS — N186 End stage renal disease: Secondary | ICD-10-CM | POA: Diagnosis not present

## 2015-10-15 DIAGNOSIS — N2581 Secondary hyperparathyroidism of renal origin: Secondary | ICD-10-CM | POA: Diagnosis not present

## 2015-10-15 DIAGNOSIS — D509 Iron deficiency anemia, unspecified: Secondary | ICD-10-CM | POA: Diagnosis not present

## 2015-10-15 DIAGNOSIS — E1129 Type 2 diabetes mellitus with other diabetic kidney complication: Secondary | ICD-10-CM | POA: Diagnosis not present

## 2015-10-18 DIAGNOSIS — E1129 Type 2 diabetes mellitus with other diabetic kidney complication: Secondary | ICD-10-CM | POA: Diagnosis not present

## 2015-10-18 DIAGNOSIS — N2581 Secondary hyperparathyroidism of renal origin: Secondary | ICD-10-CM | POA: Diagnosis not present

## 2015-10-18 DIAGNOSIS — N186 End stage renal disease: Secondary | ICD-10-CM | POA: Diagnosis not present

## 2015-10-18 DIAGNOSIS — D509 Iron deficiency anemia, unspecified: Secondary | ICD-10-CM | POA: Diagnosis not present

## 2015-10-18 DIAGNOSIS — D631 Anemia in chronic kidney disease: Secondary | ICD-10-CM | POA: Diagnosis not present

## 2015-10-20 DIAGNOSIS — D631 Anemia in chronic kidney disease: Secondary | ICD-10-CM | POA: Diagnosis not present

## 2015-10-20 DIAGNOSIS — E1129 Type 2 diabetes mellitus with other diabetic kidney complication: Secondary | ICD-10-CM | POA: Diagnosis not present

## 2015-10-20 DIAGNOSIS — N2581 Secondary hyperparathyroidism of renal origin: Secondary | ICD-10-CM | POA: Diagnosis not present

## 2015-10-20 DIAGNOSIS — N186 End stage renal disease: Secondary | ICD-10-CM | POA: Diagnosis not present

## 2015-10-20 DIAGNOSIS — D509 Iron deficiency anemia, unspecified: Secondary | ICD-10-CM | POA: Diagnosis not present

## 2015-10-22 ENCOUNTER — Encounter: Payer: Self-pay | Admitting: Internal Medicine

## 2015-10-22 DIAGNOSIS — D631 Anemia in chronic kidney disease: Secondary | ICD-10-CM | POA: Diagnosis not present

## 2015-10-22 DIAGNOSIS — D509 Iron deficiency anemia, unspecified: Secondary | ICD-10-CM | POA: Diagnosis not present

## 2015-10-22 DIAGNOSIS — N186 End stage renal disease: Secondary | ICD-10-CM | POA: Diagnosis not present

## 2015-10-22 DIAGNOSIS — N2581 Secondary hyperparathyroidism of renal origin: Secondary | ICD-10-CM | POA: Diagnosis not present

## 2015-10-22 DIAGNOSIS — E1129 Type 2 diabetes mellitus with other diabetic kidney complication: Secondary | ICD-10-CM | POA: Diagnosis not present

## 2015-10-25 DIAGNOSIS — N2581 Secondary hyperparathyroidism of renal origin: Secondary | ICD-10-CM | POA: Diagnosis not present

## 2015-10-25 DIAGNOSIS — D509 Iron deficiency anemia, unspecified: Secondary | ICD-10-CM | POA: Diagnosis not present

## 2015-10-25 DIAGNOSIS — E1129 Type 2 diabetes mellitus with other diabetic kidney complication: Secondary | ICD-10-CM | POA: Diagnosis not present

## 2015-10-25 DIAGNOSIS — D631 Anemia in chronic kidney disease: Secondary | ICD-10-CM | POA: Diagnosis not present

## 2015-10-25 DIAGNOSIS — N186 End stage renal disease: Secondary | ICD-10-CM | POA: Diagnosis not present

## 2015-10-27 ENCOUNTER — Telehealth: Payer: Self-pay | Admitting: Emergency Medicine

## 2015-10-27 DIAGNOSIS — D631 Anemia in chronic kidney disease: Secondary | ICD-10-CM | POA: Diagnosis not present

## 2015-10-27 DIAGNOSIS — D509 Iron deficiency anemia, unspecified: Secondary | ICD-10-CM | POA: Diagnosis not present

## 2015-10-27 DIAGNOSIS — N2581 Secondary hyperparathyroidism of renal origin: Secondary | ICD-10-CM | POA: Diagnosis not present

## 2015-10-27 DIAGNOSIS — N186 End stage renal disease: Secondary | ICD-10-CM | POA: Diagnosis not present

## 2015-10-27 DIAGNOSIS — E1129 Type 2 diabetes mellitus with other diabetic kidney complication: Secondary | ICD-10-CM | POA: Diagnosis not present

## 2015-10-27 NOTE — Telephone Encounter (Signed)
Eyrin called and needs a verbal order for the patients colostomy supplies. Reference # is [id number]. Informed him PCP was out of the office. Thanks.

## 2015-10-29 DIAGNOSIS — D631 Anemia in chronic kidney disease: Secondary | ICD-10-CM | POA: Diagnosis not present

## 2015-10-29 DIAGNOSIS — N186 End stage renal disease: Secondary | ICD-10-CM | POA: Diagnosis not present

## 2015-10-29 DIAGNOSIS — D509 Iron deficiency anemia, unspecified: Secondary | ICD-10-CM | POA: Diagnosis not present

## 2015-10-29 DIAGNOSIS — N2581 Secondary hyperparathyroidism of renal origin: Secondary | ICD-10-CM | POA: Diagnosis not present

## 2015-10-29 DIAGNOSIS — E1129 Type 2 diabetes mellitus with other diabetic kidney complication: Secondary | ICD-10-CM | POA: Diagnosis not present

## 2015-10-29 NOTE — Telephone Encounter (Signed)
Spoke with pt's dtr (ok per pt). She would like to schedule ablation 11/8. She understands I will arrange and call next Tuesday to review instructions. Patient/dtr verbalized understanding and agreeable to plan.

## 2015-11-01 DIAGNOSIS — D631 Anemia in chronic kidney disease: Secondary | ICD-10-CM | POA: Diagnosis not present

## 2015-11-01 DIAGNOSIS — N2581 Secondary hyperparathyroidism of renal origin: Secondary | ICD-10-CM | POA: Diagnosis not present

## 2015-11-01 DIAGNOSIS — N186 End stage renal disease: Secondary | ICD-10-CM | POA: Diagnosis not present

## 2015-11-01 DIAGNOSIS — E1129 Type 2 diabetes mellitus with other diabetic kidney complication: Secondary | ICD-10-CM | POA: Diagnosis not present

## 2015-11-01 DIAGNOSIS — D509 Iron deficiency anemia, unspecified: Secondary | ICD-10-CM | POA: Diagnosis not present

## 2015-11-02 ENCOUNTER — Ambulatory Visit: Payer: Medicare Other | Admitting: Internal Medicine

## 2015-11-02 NOTE — Telephone Encounter (Signed)
Called number below. No answer. Will try again later.   

## 2015-11-03 DIAGNOSIS — N2581 Secondary hyperparathyroidism of renal origin: Secondary | ICD-10-CM | POA: Diagnosis not present

## 2015-11-03 DIAGNOSIS — N186 End stage renal disease: Secondary | ICD-10-CM | POA: Diagnosis not present

## 2015-11-03 DIAGNOSIS — D631 Anemia in chronic kidney disease: Secondary | ICD-10-CM | POA: Diagnosis not present

## 2015-11-03 DIAGNOSIS — E1129 Type 2 diabetes mellitus with other diabetic kidney complication: Secondary | ICD-10-CM | POA: Diagnosis not present

## 2015-11-03 DIAGNOSIS — D509 Iron deficiency anemia, unspecified: Secondary | ICD-10-CM | POA: Diagnosis not present

## 2015-11-03 NOTE — Telephone Encounter (Signed)
rec fax from [facility] for below. PCP signed and I faxed it back to them.

## 2015-11-05 DIAGNOSIS — E1129 Type 2 diabetes mellitus with other diabetic kidney complication: Secondary | ICD-10-CM | POA: Diagnosis not present

## 2015-11-05 DIAGNOSIS — N186 End stage renal disease: Secondary | ICD-10-CM | POA: Diagnosis not present

## 2015-11-05 DIAGNOSIS — D509 Iron deficiency anemia, unspecified: Secondary | ICD-10-CM | POA: Diagnosis not present

## 2015-11-05 DIAGNOSIS — N2581 Secondary hyperparathyroidism of renal origin: Secondary | ICD-10-CM | POA: Diagnosis not present

## 2015-11-05 DIAGNOSIS — D631 Anemia in chronic kidney disease: Secondary | ICD-10-CM | POA: Diagnosis not present

## 2015-11-05 NOTE — Telephone Encounter (Signed)
lmtcb to rv instructions and schedule lab work and f/u appts

## 2015-11-08 DIAGNOSIS — D509 Iron deficiency anemia, unspecified: Secondary | ICD-10-CM | POA: Diagnosis not present

## 2015-11-08 DIAGNOSIS — E1129 Type 2 diabetes mellitus with other diabetic kidney complication: Secondary | ICD-10-CM | POA: Diagnosis not present

## 2015-11-08 DIAGNOSIS — D631 Anemia in chronic kidney disease: Secondary | ICD-10-CM | POA: Diagnosis not present

## 2015-11-08 DIAGNOSIS — N186 End stage renal disease: Secondary | ICD-10-CM | POA: Diagnosis not present

## 2015-11-08 DIAGNOSIS — N2581 Secondary hyperparathyroidism of renal origin: Secondary | ICD-10-CM | POA: Diagnosis not present

## 2015-11-09 DIAGNOSIS — N186 End stage renal disease: Secondary | ICD-10-CM | POA: Diagnosis not present

## 2015-11-09 DIAGNOSIS — E1129 Type 2 diabetes mellitus with other diabetic kidney complication: Secondary | ICD-10-CM | POA: Diagnosis not present

## 2015-11-09 DIAGNOSIS — Z992 Dependence on renal dialysis: Secondary | ICD-10-CM | POA: Diagnosis not present

## 2015-11-09 DIAGNOSIS — H401133 Primary open-angle glaucoma, bilateral, severe stage: Secondary | ICD-10-CM | POA: Diagnosis not present

## 2015-11-10 DIAGNOSIS — N186 End stage renal disease: Secondary | ICD-10-CM | POA: Diagnosis not present

## 2015-11-10 DIAGNOSIS — D509 Iron deficiency anemia, unspecified: Secondary | ICD-10-CM | POA: Diagnosis not present

## 2015-11-10 DIAGNOSIS — D631 Anemia in chronic kidney disease: Secondary | ICD-10-CM | POA: Diagnosis not present

## 2015-11-10 DIAGNOSIS — N2581 Secondary hyperparathyroidism of renal origin: Secondary | ICD-10-CM | POA: Diagnosis not present

## 2015-11-10 DIAGNOSIS — E119 Type 2 diabetes mellitus without complications: Secondary | ICD-10-CM | POA: Diagnosis not present

## 2015-11-10 DIAGNOSIS — Z23 Encounter for immunization: Secondary | ICD-10-CM | POA: Diagnosis not present

## 2015-11-11 NOTE — Telephone Encounter (Signed)
Reviewed procedure instructions w/ pt's dtr. Pre procedure labs to be drawn in the hospital the morning of her procedure. 4 week follow up appt made. I will check with Dr. Curt Bears about holding Brilinta and call her next week with recommendation. Patient's dtr verbalized understanding and agreeable to plan.

## 2015-11-12 DIAGNOSIS — N2581 Secondary hyperparathyroidism of renal origin: Secondary | ICD-10-CM | POA: Diagnosis not present

## 2015-11-12 DIAGNOSIS — Z23 Encounter for immunization: Secondary | ICD-10-CM | POA: Diagnosis not present

## 2015-11-12 DIAGNOSIS — D509 Iron deficiency anemia, unspecified: Secondary | ICD-10-CM | POA: Diagnosis not present

## 2015-11-12 DIAGNOSIS — E119 Type 2 diabetes mellitus without complications: Secondary | ICD-10-CM | POA: Diagnosis not present

## 2015-11-12 DIAGNOSIS — N186 End stage renal disease: Secondary | ICD-10-CM | POA: Diagnosis not present

## 2015-11-12 DIAGNOSIS — D631 Anemia in chronic kidney disease: Secondary | ICD-10-CM | POA: Diagnosis not present

## 2015-11-15 DIAGNOSIS — D631 Anemia in chronic kidney disease: Secondary | ICD-10-CM | POA: Diagnosis not present

## 2015-11-15 DIAGNOSIS — N2581 Secondary hyperparathyroidism of renal origin: Secondary | ICD-10-CM | POA: Diagnosis not present

## 2015-11-15 DIAGNOSIS — D509 Iron deficiency anemia, unspecified: Secondary | ICD-10-CM | POA: Diagnosis not present

## 2015-11-15 DIAGNOSIS — E119 Type 2 diabetes mellitus without complications: Secondary | ICD-10-CM | POA: Diagnosis not present

## 2015-11-15 DIAGNOSIS — Z23 Encounter for immunization: Secondary | ICD-10-CM | POA: Diagnosis not present

## 2015-11-15 DIAGNOSIS — N186 End stage renal disease: Secondary | ICD-10-CM | POA: Diagnosis not present

## 2015-11-16 NOTE — Telephone Encounter (Signed)
Attempted to reach patient without success.  Called pt's dtr (whom I organized ablation procedure with) to inform her to have pt hold Brilinta for procedure (per Dr. Curt Bears).  Dtr verbalized understanding and will make sure pt gets message.

## 2015-11-17 ENCOUNTER — Encounter (HOSPITAL_COMMUNITY)
Admission: RE | Disposition: A | Discharge status: Home / Self Care | Payer: Self-pay | Source: Ambulatory Visit | Attending: Cardiology

## 2015-11-17 ENCOUNTER — Encounter (HOSPITAL_COMMUNITY): Payer: Self-pay | Admitting: General Practice

## 2015-11-17 ENCOUNTER — Ambulatory Visit (HOSPITAL_COMMUNITY)
Admission: RE | Admit: 2015-11-17 | Discharge: 2015-11-18 | Disposition: A | Discharge status: Home / Self Care | Payer: Medicare Other | Source: Ambulatory Visit | Attending: Cardiology | Admitting: Cardiology

## 2015-11-17 DIAGNOSIS — Z888 Allergy status to other drugs, medicaments and biological substances status: Secondary | ICD-10-CM | POA: Diagnosis not present

## 2015-11-17 DIAGNOSIS — I447 Left bundle-branch block, unspecified: Secondary | ICD-10-CM | POA: Insufficient documentation

## 2015-11-17 DIAGNOSIS — Z7951 Long term (current) use of inhaled steroids: Secondary | ICD-10-CM | POA: Diagnosis not present

## 2015-11-17 DIAGNOSIS — N186 End stage renal disease: Secondary | ICD-10-CM | POA: Diagnosis not present

## 2015-11-17 DIAGNOSIS — E1122 Type 2 diabetes mellitus with diabetic chronic kidney disease: Secondary | ICD-10-CM | POA: Diagnosis not present

## 2015-11-17 DIAGNOSIS — I471 Supraventricular tachycardia: Secondary | ICD-10-CM | POA: Diagnosis not present

## 2015-11-17 DIAGNOSIS — I120 Hypertensive chronic kidney disease with stage 5 chronic kidney disease or end stage renal disease: Secondary | ICD-10-CM | POA: Diagnosis not present

## 2015-11-17 DIAGNOSIS — Z91012 Allergy to eggs: Secondary | ICD-10-CM | POA: Diagnosis not present

## 2015-11-17 DIAGNOSIS — Z91041 Radiographic dye allergy status: Secondary | ICD-10-CM | POA: Insufficient documentation

## 2015-11-17 DIAGNOSIS — Z79899 Other long term (current) drug therapy: Secondary | ICD-10-CM | POA: Diagnosis not present

## 2015-11-17 DIAGNOSIS — Z992 Dependence on renal dialysis: Secondary | ICD-10-CM | POA: Diagnosis not present

## 2015-11-17 DIAGNOSIS — Z7982 Long term (current) use of aspirin: Secondary | ICD-10-CM | POA: Diagnosis not present

## 2015-11-17 HISTORY — PX: SUPRAVENTRICULAR TACHYCARDIA ABLATION: SHX6106

## 2015-11-17 HISTORY — DX: End stage renal disease: N18.6

## 2015-11-17 HISTORY — DX: Type 2 diabetes mellitus without complications: E11.9

## 2015-11-17 HISTORY — DX: Malignant neoplasm of colon, unspecified: C18.9

## 2015-11-17 HISTORY — PX: ELECTROPHYSIOLOGIC STUDY: SHX172A

## 2015-11-17 HISTORY — DX: Dependence on renal dialysis: Z99.2

## 2015-11-17 LAB — BASIC METABOLIC PANEL
ANION GAP: 17 — AB (ref 5–15)
BUN: 45 mg/dL — ABNORMAL HIGH (ref 6–20)
CHLORIDE: 96 mmol/L — AB (ref 101–111)
CO2: 29 mmol/L (ref 22–32)
Calcium: 8.4 mg/dL — ABNORMAL LOW (ref 8.9–10.3)
Creatinine, Ser: 6.39 mg/dL — ABNORMAL HIGH (ref 0.44–1.00)
GFR calc non Af Amer: 6 mL/min — ABNORMAL LOW (ref >60)
GFR, EST AFRICAN AMERICAN: 7 mL/min — AB (ref >60)
Glucose, Bld: 91 mg/dL (ref 65–99)
POTASSIUM: 3.3 mmol/L — AB (ref 3.5–5.1)
SODIUM: 142 mmol/L (ref 135–145)

## 2015-11-17 LAB — CBC
HEMATOCRIT: 33.9 % — AB (ref 36.0–46.0)
HEMOGLOBIN: 10.4 g/dL — AB (ref 12.0–15.0)
MCH: 25.8 pg — ABNORMAL LOW (ref 26.0–34.0)
MCHC: 30.7 g/dL (ref 30.0–36.0)
MCV: 84.1 fL (ref 78.0–100.0)
Platelets: 224 10*3/uL (ref 150–400)
RBC: 4.03 MIL/uL (ref 3.87–5.11)
RDW: 18.9 % — ABNORMAL HIGH (ref 11.5–15.5)
WBC: 6.6 10*3/uL (ref 4.0–10.5)

## 2015-11-17 LAB — GLUCOSE, CAPILLARY
GLUCOSE-CAPILLARY: 104 mg/dL — AB (ref 65–99)
Glucose-Capillary: 74 mg/dL (ref 65–99)
Glucose-Capillary: 115 mg/dL — ABNORMAL HIGH (ref 65–99)

## 2015-11-17 SURGERY — A-FLUTTER/A-TACH/SVT ABLATION

## 2015-11-17 MED ORDER — DORZOLAMIDE HCL-TIMOLOL MAL 2-0.5 % OP SOLN
1.0000 [drp] | Freq: Two times a day (BID) | OPHTHALMIC | Status: DC
  Administered 2015-11-17 – 2015-11-18 (×2): 1 [drp] via OPHTHALMIC
  Filled 2015-11-17: qty 10

## 2015-11-17 MED ORDER — MIDAZOLAM HCL 5 MG/5ML IJ SOLN
INTRAMUSCULAR | Status: AC
Start: 2015-11-17 — End: 2015-11-17
  Filled 2015-11-17: qty 5

## 2015-11-17 MED ORDER — MOMETASONE FURO-FORMOTEROL FUM 200-5 MCG/ACT IN AERO
2.0000 | INHALATION_SPRAY | Freq: Two times a day (BID) | RESPIRATORY_TRACT | Status: DC
  Administered 2015-11-18: 2 via RESPIRATORY_TRACT
  Filled 2015-11-17: qty 8.8

## 2015-11-17 MED ORDER — ACETAMINOPHEN 500 MG PO TABS: 500.0000 mg | ORAL_TABLET | Freq: Four times a day (QID) | ORAL | Status: DC | PRN

## 2015-11-17 MED ORDER — BUPIVACAINE HCL (PF) 0.25 % IJ SOLN
INTRAMUSCULAR | Status: DC | PRN
  Administered 2015-11-17: 50 mL

## 2015-11-17 MED ORDER — ASPIRIN EC 81 MG PO TBEC
81.0000 mg | DELAYED_RELEASE_TABLET | Freq: Every day | ORAL | Status: DC
  Administered 2015-11-17 – 2015-11-18 (×2): 81 mg via ORAL
  Filled 2015-11-17 (×2): qty 1

## 2015-11-17 MED ORDER — NITROGLYCERIN 0.4 MG SL SUBL: 0.4000 mg | SUBLINGUAL_TABLET | SUBLINGUAL | Status: DC | PRN

## 2015-11-17 MED ORDER — TICAGRELOR 90 MG PO TABS
90.0000 mg | ORAL_TABLET | Freq: Two times a day (BID) | ORAL | Status: DC
  Administered 2015-11-17 – 2015-11-18 (×2): 90 mg via ORAL
  Filled 2015-11-17 (×2): qty 1

## 2015-11-17 MED ORDER — MIDAZOLAM HCL 5 MG/5ML IJ SOLN
INTRAMUSCULAR | Status: DC | PRN
  Administered 2015-11-17: 1 mg via INTRAVENOUS

## 2015-11-17 MED ORDER — SIMVASTATIN 20 MG PO TABS
10.0000 mg | ORAL_TABLET | Freq: Every evening | ORAL | Status: DC
  Administered 2015-11-17: 21:00:00 10 mg via ORAL
  Filled 2015-11-17: qty 1

## 2015-11-17 MED ORDER — HEPARIN (PORCINE) IN NACL 2-0.9 UNIT/ML-% IJ SOLN
INTRAMUSCULAR | Status: AC
  Filled 2015-11-17: qty 1000

## 2015-11-17 MED ORDER — SODIUM CHLORIDE 0.9% FLUSH
3.0000 mL | Freq: Two times a day (BID) | INTRAVENOUS | Status: DC
  Administered 2015-11-17: 21:00:00 3 mL via INTRAVENOUS

## 2015-11-17 MED ORDER — SODIUM CHLORIDE 0.9 % IV SOLN: 250.0000 mL | INTRAVENOUS | Status: DC | PRN

## 2015-11-17 MED ORDER — HEPARIN (PORCINE) IN NACL 2-0.9 UNIT/ML-% IJ SOLN
INTRAMUSCULAR | Status: DC | PRN
  Administered 2015-11-17: 500 mL

## 2015-11-17 MED ORDER — BUPIVACAINE HCL (PF) 0.25 % IJ SOLN
INTRAMUSCULAR | Status: AC
  Filled 2015-11-17: qty 60

## 2015-11-17 MED ORDER — AMIODARONE HCL 200 MG PO TABS
200.0000 mg | ORAL_TABLET | Freq: Every day | ORAL | Status: DC
  Administered 2015-11-17 – 2015-11-18 (×2): 200 mg via ORAL
  Filled 2015-11-17 (×2): qty 1

## 2015-11-17 MED ORDER — CALCIUM ACETATE (PHOS BINDER) 667 MG PO CAPS
2001.0000 mg | ORAL_CAPSULE | Freq: Three times a day (TID) | ORAL | Status: DC
  Administered 2015-11-18 (×2): 2001 mg via ORAL
  Filled 2015-11-17: qty 3

## 2015-11-17 MED ORDER — SODIUM CHLORIDE 0.9 % IV SOLN
INTRAVENOUS | Status: DC | PRN
  Administered 2015-11-17: 2 ug/min via INTRAVENOUS

## 2015-11-17 MED ORDER — ISOPROTERENOL HCL 0.2 MG/ML IJ SOLN
INTRAMUSCULAR | Status: AC
  Filled 2015-11-17: qty 5

## 2015-11-17 MED ORDER — CALCIUM ACETATE (PHOS BINDER) 667 MG PO CAPS: 1334.0000 mg | ORAL_CAPSULE | Freq: Three times a day (TID) | ORAL | Status: DC | PRN

## 2015-11-17 MED ORDER — FENTANYL CITRATE (PF) 100 MCG/2ML IJ SOLN
INTRAMUSCULAR | Status: AC
  Filled 2015-11-17: qty 2

## 2015-11-17 MED ORDER — FENTANYL CITRATE (PF) 100 MCG/2ML IJ SOLN
INTRAMUSCULAR | Status: DC | PRN
  Administered 2015-11-17 (×2): 25 ug via INTRAVENOUS

## 2015-11-17 MED ORDER — ONDANSETRON HCL 4 MG/2ML IJ SOLN: 4.0000 mg | Freq: Four times a day (QID) | INTRAMUSCULAR | Status: DC | PRN

## 2015-11-17 MED ORDER — SODIUM CHLORIDE 0.9% FLUSH: 3.0000 mL | INTRAVENOUS | Status: DC | PRN

## 2015-11-17 MED ORDER — ACETAMINOPHEN 325 MG PO TABS
650.0000 mg | ORAL_TABLET | ORAL | Status: DC | PRN
  Administered 2015-11-18: 01:00:00 650 mg via ORAL
  Filled 2015-11-17: qty 2

## 2015-11-17 MED ORDER — REPAGLINIDE 0.5 MG PO TABS
0.5000 mg | ORAL_TABLET | Freq: Three times a day (TID) | ORAL | Status: DC
  Administered 2015-11-18 (×2): 0.5 mg via ORAL
  Filled 2015-11-17 (×3): qty 1

## 2015-11-17 MED ORDER — POTASSIUM CHLORIDE 10 MEQ/50ML IV SOLN
10.0000 meq | INTRAVENOUS | Status: AC
  Administered 2015-11-17: 10 meq via INTRAVENOUS
  Filled 2015-11-17 (×3): qty 50

## 2015-11-17 MED ORDER — METOPROLOL TARTRATE 25 MG PO TABS
50.0000 mg | ORAL_TABLET | ORAL | Status: DC
  Administered 2015-11-18: 50 mg via ORAL
  Filled 2015-11-17: qty 2

## 2015-11-17 SURGICAL SUPPLY — 13 items
BAG SNAP BAND KOVER 36X36 (MISCELLANEOUS) ×3 IMPLANT
CATH DUODECA/ISMUS 7FR REPROC (CATHETERS) ×3 IMPLANT
CATH EZ STEER NAV 4MM D-F CUR (ABLATOR) ×3 IMPLANT
CATH JOSEPH QUAD ALLRED 6F REP (CATHETERS) ×6 IMPLANT
CATH WEBSTER BI DIR CS D-F CRV (CATHETERS) ×3 IMPLANT
PACK EP LATEX FREE (CUSTOM PROCEDURE TRAY) ×3
PACK EP LF (CUSTOM PROCEDURE TRAY) ×1 IMPLANT
PAD DEFIB LIFELINK (PAD) ×3 IMPLANT
PATCH CARTO3 (PAD) ×3 IMPLANT
SHEATH PINNACLE 6F 10CM (SHEATH) ×6 IMPLANT
SHEATH PINNACLE 7F 10CM (SHEATH) ×3 IMPLANT
SHEATH PINNACLE 8F 10CM (SHEATH) ×3 IMPLANT
SHIELD RADPAD SCOOP 12X17 (MISCELLANEOUS) ×3 IMPLANT

## 2015-11-17 NOTE — Progress Notes (Signed)
Site area: left groin sheaths pulled and pressure held by [employer] Prior to Removal:  Level 0 Pressure Applied For:  20 minutes Manual:   yes Patient Status During Pull:  stable Post Pull Site:  Level  0 Post Pull Instructions Given:  yes Post Pull Pulses Present: yes Dressing Applied:  Small tegaderm Bedrest begins @ 1715 Comments:  IV saline locked

## 2015-11-17 NOTE — Progress Notes (Signed)
Unable to obtain IV access after 2 unsuccessful attempts by Jeannene Patella, RN. IV team consult order placed. Dr. Curt Bears in to see pt and aware of no access at present.

## 2015-11-17 NOTE — H&P (Signed)
[name] is a 70 y.o. female with a history of atrial tachycardia.  Presents today for ablation.  On exam, tachycardic, regular, no murmurs, lungs clear. Risks and benefits explained to the patient.  Risks include but not limited to bleeding, infection, tamponade, heart block.  Patient understands the risks of the procedure and has agreed to the procedure.    Will Curt Bears, MD 11/17/2015 11:35 AM

## 2015-11-17 NOTE — Progress Notes (Addendum)
Site area: rt groin Site Prior to Removal:  Level 0 Pressure Applied For:  20 minutes Manual:   yes Patient Status During Pull:  stable Post Pull Site:  Level  0 Post Pull Instructions Given:  yes Post Pull Pulses Present: yes Dressing Applied:  Small tegaderm Bedrest begins @ [id number] Comments:

## 2015-11-18 ENCOUNTER — Encounter (HOSPITAL_COMMUNITY): Payer: Self-pay | Admitting: Cardiology

## 2015-11-18 ENCOUNTER — Telehealth: Payer: Self-pay | Admitting: *Deleted

## 2015-11-18 DIAGNOSIS — I447 Left bundle-branch block, unspecified: Secondary | ICD-10-CM | POA: Diagnosis not present

## 2015-11-18 DIAGNOSIS — Z91012 Allergy to eggs: Secondary | ICD-10-CM | POA: Diagnosis not present

## 2015-11-18 DIAGNOSIS — I120 Hypertensive chronic kidney disease with stage 5 chronic kidney disease or end stage renal disease: Secondary | ICD-10-CM | POA: Diagnosis not present

## 2015-11-18 DIAGNOSIS — Z992 Dependence on renal dialysis: Secondary | ICD-10-CM | POA: Diagnosis not present

## 2015-11-18 DIAGNOSIS — E1122 Type 2 diabetes mellitus with diabetic chronic kidney disease: Secondary | ICD-10-CM | POA: Diagnosis not present

## 2015-11-18 DIAGNOSIS — Z79899 Other long term (current) drug therapy: Secondary | ICD-10-CM | POA: Diagnosis not present

## 2015-11-18 DIAGNOSIS — Z91041 Radiographic dye allergy status: Secondary | ICD-10-CM | POA: Diagnosis not present

## 2015-11-18 DIAGNOSIS — Z7951 Long term (current) use of inhaled steroids: Secondary | ICD-10-CM | POA: Diagnosis not present

## 2015-11-18 DIAGNOSIS — I471 Supraventricular tachycardia: Secondary | ICD-10-CM | POA: Diagnosis not present

## 2015-11-18 DIAGNOSIS — Z7982 Long term (current) use of aspirin: Secondary | ICD-10-CM | POA: Diagnosis not present

## 2015-11-18 DIAGNOSIS — Z888 Allergy status to other drugs, medicaments and biological substances status: Secondary | ICD-10-CM | POA: Diagnosis not present

## 2015-11-18 DIAGNOSIS — N186 End stage renal disease: Secondary | ICD-10-CM | POA: Diagnosis not present

## 2015-11-18 LAB — GLUCOSE, CAPILLARY
GLUCOSE-CAPILLARY: 106 mg/dL — AB (ref 65–99)
Glucose-Capillary: 71 mg/dL (ref 65–99)
Glucose-Capillary: 80 mg/dL (ref 65–99)

## 2015-11-18 LAB — MRSA PCR SCREENING: MRSA BY PCR: POSITIVE — AB

## 2015-11-18 MED ORDER — OFF THE BEAT BOOK
Freq: Once | Status: AC
  Administered 2015-11-18: 08:00:00
  Filled 2015-11-18: qty 1

## 2015-11-18 MED ORDER — CHLORHEXIDINE GLUCONATE CLOTH 2 % EX PADS: 6.0000 | MEDICATED_PAD | Freq: Every day | CUTANEOUS | Status: DC

## 2015-11-18 MED ORDER — MUPIROCIN 2 % EX OINT: 1.0000 "application " | TOPICAL_OINTMENT | Freq: Two times a day (BID) | CUTANEOUS | 0 refills | Status: AC

## 2015-11-18 MED ORDER — MUPIROCIN 2 % EX OINT
1.0000 "application " | TOPICAL_OINTMENT | Freq: Two times a day (BID) | CUTANEOUS | Status: DC
  Administered 2015-11-18: 1 via NASAL
  Filled 2015-11-18 (×2): qty 22

## 2015-11-18 NOTE — Care Management Note (Signed)
Case Management Note  Patient Details  Name: [name] MRN: [id number] Date of Birth: 02-15-1945  Subjective/Objective:  S/p ablation, NCM will cont to follow for dc needs. Patient for dc today, no needs.                 Action/Plan:   Expected Discharge Date:                  Expected Discharge Plan:  Home/Self Care  In-House Referral:     Discharge planning Services  CM Consult  Post Acute Care Choice:    Choice offered to:     DME Arranged:    DME Agency:     HH Arranged:    HH Agency:     Status of Service:  Completed, signed off  If discussed at [employer] of Stay Meetings, dates discussed:    Additional Comments:  Zenon Mayo, RN 11/18/2015, 8:43 AM

## 2015-11-18 NOTE — Progress Notes (Signed)
Pt c/o shortness of breath; restless. Denies pain.  Alert and oriented x 4.  Hynd[place] 15.sats 91% on RA Given Dulera (per pt request), and repositioned, placed on 2l [place]. . 100% on 2l [place]. Will continue to monitor and document any worsening symptoms.

## 2015-11-18 NOTE — Telephone Encounter (Signed)
-----   Message from Will Meredith Leeds, MD sent at 11/16/2015  4:03 PM EST ----- Regarding: RE: Brilinita Just asked Ellyn Hack, awaiting a reply. ----- Message ----- From: Stanton Kidney, RN Sent: 11/16/2015   8:21 AM To: Constance Haw, MD Subject: Brilinita                                      Please confirm if pt is ok to [address]. I told them to hold for now.  Please instruct pt tomorrow, at procedure, about Brilinta going forward.  thx :)

## 2015-11-18 NOTE — Discharge Summary (Signed)
[name]     ELECTROPHYSIOLOGY PROCEDURE DISCHARGE SUMMARY    Patient ID: [name],  MRN: [id number], DOB/AGE: July 14, 1945 70 y.o.  Admit date: 11/17/2015 Discharge date: 11/18/2015  Primary Care Physician: [name] is a 70 y.o. female  Primary Cardiologist: Dr. Ellyn Hack Electrophysiologist: Dr. Curt Bears  Primary Discharge Diagnosis:  1. Atrial tachycardia  Secondary Discharge Diagnosis:  1. ESRF on HD 2. HTN 3. DM 4. LBBB  Allergies  Allergen Reactions  . Ace Inhibitors Cough  . Eggs Or Egg-Derived Products Nausea And Vomiting  . Enalapril Cough  . Lisinopril Cough  . Omnipaque [Iohexol] Hives     Procedures This Admission: 1.  Electrophysiology study and radiofrequency catheter ablation on 11/17/15 by Dr Curt Bears.  This study demonstrated CONCLUSIONS:  1. Sinus rhythm upon presentation.  2. The patient had atrial tachycardia 3. Successful radiofrequency modification of the atrial tachycardia focus x2. 4. No inducible arrhythmias following ablation.  5. No early apparent complications.   Brief HPI: [name] is a 70 y.o. female with a past medical history as outlined above. Risks, benefits, and alternatives to EPS/ ablation were reviewed with the patient who wished to proceed.    Hospital Course:  The patient was admitted and underwent EPS/RFCA with details as outlined above. She was monitored on telemetry overnight which demonstrated SR.  Groin sites were without complication.  The patient inquired about getting HD today because she missed yesterday given her procedure, mentioning a little SOB last PM, Dr. Curt Bears felt she would be OK after examining her to wait until her usual HD session tomorrow, though Nephrology was consulted to have HD done today, in d/w them they were happy to see the patient, though dialysis likely would be late in the day.  The patient given that, felt she preferred to discharge and go ahead with her usual dialysis tomorrow morning  seeing as she does well over the weekends going 2 days without.  She denies any significant SOB, was asurred she could stay and her HD would be done but given the time would be later today she preferred to leave, stating she felt fine.  The patient was examined by Dr. Curt Bears and considered stable for discharge to home.  He was made aware that ultimately the patient decided to go ahead and discharge, and he cleared her for this.  She [name] be continued on her Brilinta to f/u with Dr. Ellyn Hack (as per their instructions) to decide going forward wether she would need this long-term. I have staff messaged the office to have this follow up arranged for her.  Continue amiodarone until her f/u with Dr. Curt Bears.   Follow up [name] be arranged in 4 weeks with him.  Wound care and restrictions were reviewed with the patient prior to discharge.   Physical Exam: Vitals:   11/17/15 1710 11/17/15 1729 11/17/15 2002 11/18/15 0537  BP: (!) 123/50 (!) 133/49 (!) 127/55 (!) 131/58  Pulse: 83 83 81 79  Resp: (!) 8 18 (!) 7 (!) 23  Temp:  97.4 F (36.3 C) 97.8 F (36.6 C) 97.6 F (36.4 C)  TempSrc:  Oral Oral Oral  SpO2: 100% 98% 100% 99%  Weight:    180 lb 5.4 oz (81.8 kg)  Height:        GEN- The patient is well appearing, in NAD, alert and oriented x 3 today.   HEENT: normocephalic, atraumatic; sclera clear, conjunctiva pink; hearing intact; oropharynx clear; neck supple, no JVP Lymph- no cervical lymphadenopathy  Lungs- slightly diminished L base, normal work of breathing.  No wheezes, rales, rhonchi Heart- Regular rate and rhythm, no murmurs, rubs or gallops, PMI not laterally displaced GI- soft, non-tender, non-distended, bowel sounds present, no hepatosplenomegaly Extremities- no clubbing, cyanosis, trace if any edema, groin b/l without hematoma/bruit MS- no significant deformity or atrophy Skin- warm and dry, no rash or lesion Psych- euthymic mood, full affect Neuro- strength and sensation are  intact   Discharge Vitals: 131/58, afebrile, 99%-100% RA, 80bpm  Labs:   Lab Results  Component Value Date   WBC 6.6 11/17/2015   HGB 10.4 (L) 11/17/2015   HCT 33.9 (L) 11/17/2015   MCV 84.1 11/17/2015   PLT 224 11/17/2015     Recent Labs Lab 11/17/15 1224  NA 142  K 3.3*  CL 96*  CO2 29  BUN 45*  CREATININE 6.39*  CALCIUM 8.4*  GLUCOSE 91    Discharge Medications:    Medication List    TAKE these medications   acetaminophen 500 MG tablet Commonly known as:  TYLENOL Take 500 mg by mouth every 6 (six) hours as needed (pain).   amiodarone 200 MG tablet Commonly known as:  PACERONE Take 1 tablet (200 mg total) by mouth daily.   aspirin 81 MG tablet Take 1 tablet (81 mg total) by mouth daily.   BRILINTA 90 MG Tabs tablet Generic drug:  ticagrelor TAKE 1 TABLET BY MOUTH TWICE DAILY   calcium acetate 667 MG capsule Commonly known as:  PHOSLO Take 1,334-2,001 mg by mouth See admin instructions. Take 2001mg  three times daily with meals and Take 1334mg   with snacks.   dorzolamide-timolol 22.3-6.8 MG/ML ophthalmic solution Commonly known as:  COSOPT Place 1 drop into the left eye 2 (two) times daily.   Fluticasone-Salmeterol 250-50 MCG/DOSE Aepb Commonly known as:  ADVAIR Inhale 1 puff into the lungs daily as needed (shortness of breath).   metoprolol 50 MG tablet Commonly known as:  LOPRESSOR Take 1 tablet (50 mg total) by mouth 2 (two) times daily. Do not take this medication on dialysis days (Monday, Wednesday, Friday).   mupirocin ointment 2 % Commonly known as:  BACTROBAN Place 1 application into the nose 2 (two) times daily.   nitroGLYCERIN 0.4 MG SL tablet Commonly known as:  NITROSTAT Place 1 tablet (0.4 mg total) under the tongue every 5 (five) minutes as needed for chest pain.   repaglinide 0.5 MG tablet Commonly known as:  PRANDIN Take 1 tablet (0.5 mg total) by mouth 3 (three) times daily before meals.   simvastatin 10 MG tablet Commonly  known as:  ZOCOR Take 1 tablet (10 mg total) by mouth every evening.       Disposition:  Home Discharge Instructions    Diet - low sodium heart healthy    Complete by:  As directed    Increase activity slowly    Complete by:  As directed      Follow-up Information    Kazuo Durnil Meredith Leeds, MD Follow up on 12/28/2015.   Specialty:  Cardiology Why:  9:45AM Contact information: [address] [place] [address] [phone]        Glenetta Hew, MD Follow up.   Specialty:  Cardiology Why:  Please call Dr. Allison Quarry nurse/office to see when he would like to see you next for an appointment. Contact information: [place] [address] [phone]           Duration of Discharge Encounter: Greater  than 30 minutes including physician time.  SignedTommye Standard, PA-C 11/18/2015 11:43 AM   I have seen and examined this patient with Tommye Standard.  Agree with above, note added to reflect my findings.  On exam, regular rhythm, no murmurs, lungs clear. Presented for ablation of atrial tachycardia. Had 2 foci of atrial tachycardia with successful ablation. Heart rate in the 70s today. We'll plan for discharge with follow-up in clinic.    Christine Schiefelbein M. Kathalina Ostermann MD 11/18/2015 2:53 PM

## 2015-11-18 NOTE — Discharge Instructions (Signed)
No driving for 1 week. No lifting over 5 lbs for 1 week. No vigorous or sexual activity for 1 week. You may return to work on 11/24/15. Keep procedure site clean & dry. If you notice increased pain, swelling, bleeding or pus, call/return!  You may shower, but no soaking baths/hot tubs/pools for 1 week.

## 2015-11-18 NOTE — Telephone Encounter (Signed)
Need to inform pt to restart Brilinta -- per Dr. Curt Bears

## 2015-11-19 DIAGNOSIS — Z23 Encounter for immunization: Secondary | ICD-10-CM | POA: Diagnosis not present

## 2015-11-19 DIAGNOSIS — E119 Type 2 diabetes mellitus without complications: Secondary | ICD-10-CM | POA: Diagnosis not present

## 2015-11-19 DIAGNOSIS — D631 Anemia in chronic kidney disease: Secondary | ICD-10-CM | POA: Diagnosis not present

## 2015-11-19 DIAGNOSIS — N2581 Secondary hyperparathyroidism of renal origin: Secondary | ICD-10-CM | POA: Diagnosis not present

## 2015-11-19 DIAGNOSIS — N186 End stage renal disease: Secondary | ICD-10-CM | POA: Diagnosis not present

## 2015-11-19 DIAGNOSIS — D509 Iron deficiency anemia, unspecified: Secondary | ICD-10-CM | POA: Diagnosis not present

## 2015-11-19 MED FILL — Heparin Sodium (Porcine) 2 Unit/ML in Sodium Chloride 0.9%: INTRAMUSCULAR | Qty: 500 | Status: AC

## 2015-11-22 DIAGNOSIS — D509 Iron deficiency anemia, unspecified: Secondary | ICD-10-CM | POA: Diagnosis not present

## 2015-11-22 DIAGNOSIS — E119 Type 2 diabetes mellitus without complications: Secondary | ICD-10-CM | POA: Diagnosis not present

## 2015-11-22 DIAGNOSIS — D631 Anemia in chronic kidney disease: Secondary | ICD-10-CM | POA: Diagnosis not present

## 2015-11-22 DIAGNOSIS — N2581 Secondary hyperparathyroidism of renal origin: Secondary | ICD-10-CM | POA: Diagnosis not present

## 2015-11-22 DIAGNOSIS — Z23 Encounter for immunization: Secondary | ICD-10-CM | POA: Diagnosis not present

## 2015-11-22 DIAGNOSIS — N186 End stage renal disease: Secondary | ICD-10-CM | POA: Diagnosis not present

## 2015-11-24 DIAGNOSIS — Z23 Encounter for immunization: Secondary | ICD-10-CM | POA: Diagnosis not present

## 2015-11-24 DIAGNOSIS — E119 Type 2 diabetes mellitus without complications: Secondary | ICD-10-CM | POA: Diagnosis not present

## 2015-11-24 DIAGNOSIS — N186 End stage renal disease: Secondary | ICD-10-CM | POA: Diagnosis not present

## 2015-11-24 DIAGNOSIS — D509 Iron deficiency anemia, unspecified: Secondary | ICD-10-CM | POA: Diagnosis not present

## 2015-11-24 DIAGNOSIS — D631 Anemia in chronic kidney disease: Secondary | ICD-10-CM | POA: Diagnosis not present

## 2015-11-24 DIAGNOSIS — N2581 Secondary hyperparathyroidism of renal origin: Secondary | ICD-10-CM | POA: Diagnosis not present

## 2015-11-26 DIAGNOSIS — E119 Type 2 diabetes mellitus without complications: Secondary | ICD-10-CM | POA: Diagnosis not present

## 2015-11-26 DIAGNOSIS — N186 End stage renal disease: Secondary | ICD-10-CM | POA: Diagnosis not present

## 2015-11-26 DIAGNOSIS — N2581 Secondary hyperparathyroidism of renal origin: Secondary | ICD-10-CM | POA: Diagnosis not present

## 2015-11-26 DIAGNOSIS — Z23 Encounter for immunization: Secondary | ICD-10-CM | POA: Diagnosis not present

## 2015-11-26 DIAGNOSIS — D509 Iron deficiency anemia, unspecified: Secondary | ICD-10-CM | POA: Diagnosis not present

## 2015-11-26 DIAGNOSIS — D631 Anemia in chronic kidney disease: Secondary | ICD-10-CM | POA: Diagnosis not present

## 2015-11-28 DIAGNOSIS — N2581 Secondary hyperparathyroidism of renal origin: Secondary | ICD-10-CM | POA: Diagnosis not present

## 2015-11-28 DIAGNOSIS — D509 Iron deficiency anemia, unspecified: Secondary | ICD-10-CM | POA: Diagnosis not present

## 2015-11-28 DIAGNOSIS — D631 Anemia in chronic kidney disease: Secondary | ICD-10-CM | POA: Diagnosis not present

## 2015-11-28 DIAGNOSIS — N186 End stage renal disease: Secondary | ICD-10-CM | POA: Diagnosis not present

## 2015-11-28 DIAGNOSIS — Z23 Encounter for immunization: Secondary | ICD-10-CM | POA: Diagnosis not present

## 2015-11-28 DIAGNOSIS — E119 Type 2 diabetes mellitus without complications: Secondary | ICD-10-CM | POA: Diagnosis not present

## 2015-11-30 DIAGNOSIS — D631 Anemia in chronic kidney disease: Secondary | ICD-10-CM | POA: Diagnosis not present

## 2015-11-30 DIAGNOSIS — N2581 Secondary hyperparathyroidism of renal origin: Secondary | ICD-10-CM | POA: Diagnosis not present

## 2015-11-30 DIAGNOSIS — I739 Peripheral vascular disease, unspecified: Secondary | ICD-10-CM | POA: Diagnosis not present

## 2015-11-30 DIAGNOSIS — L603 Nail dystrophy: Secondary | ICD-10-CM | POA: Diagnosis not present

## 2015-11-30 DIAGNOSIS — N186 End stage renal disease: Secondary | ICD-10-CM | POA: Diagnosis not present

## 2015-11-30 DIAGNOSIS — L84 Corns and callosities: Secondary | ICD-10-CM | POA: Diagnosis not present

## 2015-11-30 DIAGNOSIS — E119 Type 2 diabetes mellitus without complications: Secondary | ICD-10-CM | POA: Diagnosis not present

## 2015-11-30 DIAGNOSIS — D509 Iron deficiency anemia, unspecified: Secondary | ICD-10-CM | POA: Diagnosis not present

## 2015-11-30 DIAGNOSIS — Z23 Encounter for immunization: Secondary | ICD-10-CM | POA: Diagnosis not present

## 2015-12-03 DIAGNOSIS — D631 Anemia in chronic kidney disease: Secondary | ICD-10-CM | POA: Diagnosis not present

## 2015-12-03 DIAGNOSIS — E119 Type 2 diabetes mellitus without complications: Secondary | ICD-10-CM | POA: Diagnosis not present

## 2015-12-03 DIAGNOSIS — N186 End stage renal disease: Secondary | ICD-10-CM | POA: Diagnosis not present

## 2015-12-03 DIAGNOSIS — N2581 Secondary hyperparathyroidism of renal origin: Secondary | ICD-10-CM | POA: Diagnosis not present

## 2015-12-03 DIAGNOSIS — Z23 Encounter for immunization: Secondary | ICD-10-CM | POA: Diagnosis not present

## 2015-12-03 DIAGNOSIS — D509 Iron deficiency anemia, unspecified: Secondary | ICD-10-CM | POA: Diagnosis not present

## 2015-12-06 DIAGNOSIS — Z23 Encounter for immunization: Secondary | ICD-10-CM | POA: Diagnosis not present

## 2015-12-06 DIAGNOSIS — N2581 Secondary hyperparathyroidism of renal origin: Secondary | ICD-10-CM | POA: Diagnosis not present

## 2015-12-06 DIAGNOSIS — N186 End stage renal disease: Secondary | ICD-10-CM | POA: Diagnosis not present

## 2015-12-06 DIAGNOSIS — D509 Iron deficiency anemia, unspecified: Secondary | ICD-10-CM | POA: Diagnosis not present

## 2015-12-06 DIAGNOSIS — E119 Type 2 diabetes mellitus without complications: Secondary | ICD-10-CM | POA: Diagnosis not present

## 2015-12-06 DIAGNOSIS — D631 Anemia in chronic kidney disease: Secondary | ICD-10-CM | POA: Diagnosis not present

## 2015-12-06 NOTE — Telephone Encounter (Signed)
Spoke with dtr (Pt at dialysis, ok per DPR/pt). Informed that I was out of the office since 11/9 due to family emergency and back following up on things. Called to see if anyone instructed her to restart Brilinita. Dtr tells me that pt did restart Brilinta.

## 2015-12-08 DIAGNOSIS — D631 Anemia in chronic kidney disease: Secondary | ICD-10-CM | POA: Diagnosis not present

## 2015-12-08 DIAGNOSIS — E119 Type 2 diabetes mellitus without complications: Secondary | ICD-10-CM | POA: Diagnosis not present

## 2015-12-08 DIAGNOSIS — Z23 Encounter for immunization: Secondary | ICD-10-CM | POA: Diagnosis not present

## 2015-12-08 DIAGNOSIS — N2581 Secondary hyperparathyroidism of renal origin: Secondary | ICD-10-CM | POA: Diagnosis not present

## 2015-12-08 DIAGNOSIS — D509 Iron deficiency anemia, unspecified: Secondary | ICD-10-CM | POA: Diagnosis not present

## 2015-12-08 DIAGNOSIS — N186 End stage renal disease: Secondary | ICD-10-CM | POA: Diagnosis not present

## 2015-12-09 DIAGNOSIS — Z992 Dependence on renal dialysis: Secondary | ICD-10-CM | POA: Diagnosis not present

## 2015-12-09 DIAGNOSIS — N186 End stage renal disease: Secondary | ICD-10-CM | POA: Diagnosis not present

## 2015-12-09 DIAGNOSIS — E1129 Type 2 diabetes mellitus with other diabetic kidney complication: Secondary | ICD-10-CM | POA: Diagnosis not present

## 2015-12-10 DIAGNOSIS — N186 End stage renal disease: Secondary | ICD-10-CM | POA: Diagnosis not present

## 2015-12-10 DIAGNOSIS — E1129 Type 2 diabetes mellitus with other diabetic kidney complication: Secondary | ICD-10-CM | POA: Diagnosis not present

## 2015-12-10 DIAGNOSIS — E119 Type 2 diabetes mellitus without complications: Secondary | ICD-10-CM | POA: Diagnosis not present

## 2015-12-10 DIAGNOSIS — D631 Anemia in chronic kidney disease: Secondary | ICD-10-CM | POA: Diagnosis not present

## 2015-12-10 DIAGNOSIS — D509 Iron deficiency anemia, unspecified: Secondary | ICD-10-CM | POA: Diagnosis not present

## 2015-12-10 DIAGNOSIS — N2581 Secondary hyperparathyroidism of renal origin: Secondary | ICD-10-CM | POA: Diagnosis not present

## 2015-12-13 DIAGNOSIS — D631 Anemia in chronic kidney disease: Secondary | ICD-10-CM | POA: Diagnosis not present

## 2015-12-13 DIAGNOSIS — D509 Iron deficiency anemia, unspecified: Secondary | ICD-10-CM | POA: Diagnosis not present

## 2015-12-13 DIAGNOSIS — E119 Type 2 diabetes mellitus without complications: Secondary | ICD-10-CM | POA: Diagnosis not present

## 2015-12-13 DIAGNOSIS — N186 End stage renal disease: Secondary | ICD-10-CM | POA: Diagnosis not present

## 2015-12-13 DIAGNOSIS — N2581 Secondary hyperparathyroidism of renal origin: Secondary | ICD-10-CM | POA: Diagnosis not present

## 2015-12-13 DIAGNOSIS — E1129 Type 2 diabetes mellitus with other diabetic kidney complication: Secondary | ICD-10-CM | POA: Diagnosis not present

## 2015-12-15 DIAGNOSIS — N186 End stage renal disease: Secondary | ICD-10-CM | POA: Diagnosis not present

## 2015-12-15 DIAGNOSIS — D509 Iron deficiency anemia, unspecified: Secondary | ICD-10-CM | POA: Diagnosis not present

## 2015-12-15 DIAGNOSIS — E119 Type 2 diabetes mellitus without complications: Secondary | ICD-10-CM | POA: Diagnosis not present

## 2015-12-15 DIAGNOSIS — N2581 Secondary hyperparathyroidism of renal origin: Secondary | ICD-10-CM | POA: Diagnosis not present

## 2015-12-15 DIAGNOSIS — D631 Anemia in chronic kidney disease: Secondary | ICD-10-CM | POA: Diagnosis not present

## 2015-12-15 DIAGNOSIS — E1129 Type 2 diabetes mellitus with other diabetic kidney complication: Secondary | ICD-10-CM | POA: Diagnosis not present

## 2015-12-17 DIAGNOSIS — N186 End stage renal disease: Secondary | ICD-10-CM | POA: Diagnosis not present

## 2015-12-17 DIAGNOSIS — N2581 Secondary hyperparathyroidism of renal origin: Secondary | ICD-10-CM | POA: Diagnosis not present

## 2015-12-17 DIAGNOSIS — D509 Iron deficiency anemia, unspecified: Secondary | ICD-10-CM | POA: Diagnosis not present

## 2015-12-17 DIAGNOSIS — E1129 Type 2 diabetes mellitus with other diabetic kidney complication: Secondary | ICD-10-CM | POA: Diagnosis not present

## 2015-12-17 DIAGNOSIS — E119 Type 2 diabetes mellitus without complications: Secondary | ICD-10-CM | POA: Diagnosis not present

## 2015-12-17 DIAGNOSIS — D631 Anemia in chronic kidney disease: Secondary | ICD-10-CM | POA: Diagnosis not present

## 2015-12-20 DIAGNOSIS — E119 Type 2 diabetes mellitus without complications: Secondary | ICD-10-CM | POA: Diagnosis not present

## 2015-12-20 DIAGNOSIS — N186 End stage renal disease: Secondary | ICD-10-CM | POA: Diagnosis not present

## 2015-12-20 DIAGNOSIS — N2581 Secondary hyperparathyroidism of renal origin: Secondary | ICD-10-CM | POA: Diagnosis not present

## 2015-12-20 DIAGNOSIS — D509 Iron deficiency anemia, unspecified: Secondary | ICD-10-CM | POA: Diagnosis not present

## 2015-12-20 DIAGNOSIS — D631 Anemia in chronic kidney disease: Secondary | ICD-10-CM | POA: Diagnosis not present

## 2015-12-20 DIAGNOSIS — E1129 Type 2 diabetes mellitus with other diabetic kidney complication: Secondary | ICD-10-CM | POA: Diagnosis not present

## 2015-12-22 ENCOUNTER — Encounter: Payer: Self-pay | Admitting: Cardiology

## 2015-12-22 DIAGNOSIS — E1129 Type 2 diabetes mellitus with other diabetic kidney complication: Secondary | ICD-10-CM | POA: Diagnosis not present

## 2015-12-22 DIAGNOSIS — E119 Type 2 diabetes mellitus without complications: Secondary | ICD-10-CM | POA: Diagnosis not present

## 2015-12-22 DIAGNOSIS — D631 Anemia in chronic kidney disease: Secondary | ICD-10-CM | POA: Diagnosis not present

## 2015-12-22 DIAGNOSIS — N2581 Secondary hyperparathyroidism of renal origin: Secondary | ICD-10-CM | POA: Diagnosis not present

## 2015-12-22 DIAGNOSIS — D509 Iron deficiency anemia, unspecified: Secondary | ICD-10-CM | POA: Diagnosis not present

## 2015-12-22 DIAGNOSIS — N186 End stage renal disease: Secondary | ICD-10-CM | POA: Diagnosis not present

## 2015-12-24 DIAGNOSIS — D631 Anemia in chronic kidney disease: Secondary | ICD-10-CM | POA: Diagnosis not present

## 2015-12-24 DIAGNOSIS — N186 End stage renal disease: Secondary | ICD-10-CM | POA: Diagnosis not present

## 2015-12-24 DIAGNOSIS — E1129 Type 2 diabetes mellitus with other diabetic kidney complication: Secondary | ICD-10-CM | POA: Diagnosis not present

## 2015-12-24 DIAGNOSIS — E119 Type 2 diabetes mellitus without complications: Secondary | ICD-10-CM | POA: Diagnosis not present

## 2015-12-24 DIAGNOSIS — D509 Iron deficiency anemia, unspecified: Secondary | ICD-10-CM | POA: Diagnosis not present

## 2015-12-24 DIAGNOSIS — N2581 Secondary hyperparathyroidism of renal origin: Secondary | ICD-10-CM | POA: Diagnosis not present

## 2015-12-27 DIAGNOSIS — N186 End stage renal disease: Secondary | ICD-10-CM | POA: Diagnosis not present

## 2015-12-27 DIAGNOSIS — E119 Type 2 diabetes mellitus without complications: Secondary | ICD-10-CM | POA: Diagnosis not present

## 2015-12-27 DIAGNOSIS — D631 Anemia in chronic kidney disease: Secondary | ICD-10-CM | POA: Diagnosis not present

## 2015-12-27 DIAGNOSIS — E1129 Type 2 diabetes mellitus with other diabetic kidney complication: Secondary | ICD-10-CM | POA: Diagnosis not present

## 2015-12-27 DIAGNOSIS — D509 Iron deficiency anemia, unspecified: Secondary | ICD-10-CM | POA: Diagnosis not present

## 2015-12-27 DIAGNOSIS — N2581 Secondary hyperparathyroidism of renal origin: Secondary | ICD-10-CM | POA: Diagnosis not present

## 2015-12-28 ENCOUNTER — Ambulatory Visit: Payer: Medicare Other | Admitting: Cardiology

## 2015-12-29 DIAGNOSIS — D631 Anemia in chronic kidney disease: Secondary | ICD-10-CM | POA: Diagnosis not present

## 2015-12-29 DIAGNOSIS — E119 Type 2 diabetes mellitus without complications: Secondary | ICD-10-CM | POA: Diagnosis not present

## 2015-12-29 DIAGNOSIS — N2581 Secondary hyperparathyroidism of renal origin: Secondary | ICD-10-CM | POA: Diagnosis not present

## 2015-12-29 DIAGNOSIS — N186 End stage renal disease: Secondary | ICD-10-CM | POA: Diagnosis not present

## 2015-12-29 DIAGNOSIS — D509 Iron deficiency anemia, unspecified: Secondary | ICD-10-CM | POA: Diagnosis not present

## 2015-12-29 DIAGNOSIS — E1129 Type 2 diabetes mellitus with other diabetic kidney complication: Secondary | ICD-10-CM | POA: Diagnosis not present

## 2015-12-30 ENCOUNTER — Encounter: Payer: Self-pay | Admitting: Cardiology

## 2015-12-31 DIAGNOSIS — N186 End stage renal disease: Secondary | ICD-10-CM | POA: Diagnosis not present

## 2015-12-31 DIAGNOSIS — E119 Type 2 diabetes mellitus without complications: Secondary | ICD-10-CM | POA: Diagnosis not present

## 2015-12-31 DIAGNOSIS — E1129 Type 2 diabetes mellitus with other diabetic kidney complication: Secondary | ICD-10-CM | POA: Diagnosis not present

## 2015-12-31 DIAGNOSIS — D509 Iron deficiency anemia, unspecified: Secondary | ICD-10-CM | POA: Diagnosis not present

## 2015-12-31 DIAGNOSIS — D631 Anemia in chronic kidney disease: Secondary | ICD-10-CM | POA: Diagnosis not present

## 2015-12-31 DIAGNOSIS — N2581 Secondary hyperparathyroidism of renal origin: Secondary | ICD-10-CM | POA: Diagnosis not present

## 2016-01-02 DIAGNOSIS — E119 Type 2 diabetes mellitus without complications: Secondary | ICD-10-CM | POA: Diagnosis not present

## 2016-01-02 DIAGNOSIS — E1129 Type 2 diabetes mellitus with other diabetic kidney complication: Secondary | ICD-10-CM | POA: Diagnosis not present

## 2016-01-02 DIAGNOSIS — N186 End stage renal disease: Secondary | ICD-10-CM | POA: Diagnosis not present

## 2016-01-02 DIAGNOSIS — N2581 Secondary hyperparathyroidism of renal origin: Secondary | ICD-10-CM | POA: Diagnosis not present

## 2016-01-02 DIAGNOSIS — D509 Iron deficiency anemia, unspecified: Secondary | ICD-10-CM | POA: Diagnosis not present

## 2016-01-02 DIAGNOSIS — D631 Anemia in chronic kidney disease: Secondary | ICD-10-CM | POA: Diagnosis not present

## 2016-01-04 ENCOUNTER — Encounter: Payer: Self-pay | Admitting: Cardiology

## 2016-01-05 DIAGNOSIS — D509 Iron deficiency anemia, unspecified: Secondary | ICD-10-CM | POA: Diagnosis not present

## 2016-01-05 DIAGNOSIS — N2581 Secondary hyperparathyroidism of renal origin: Secondary | ICD-10-CM | POA: Diagnosis not present

## 2016-01-05 DIAGNOSIS — N186 End stage renal disease: Secondary | ICD-10-CM | POA: Diagnosis not present

## 2016-01-05 DIAGNOSIS — E1129 Type 2 diabetes mellitus with other diabetic kidney complication: Secondary | ICD-10-CM | POA: Diagnosis not present

## 2016-01-05 DIAGNOSIS — D631 Anemia in chronic kidney disease: Secondary | ICD-10-CM | POA: Diagnosis not present

## 2016-01-05 DIAGNOSIS — E119 Type 2 diabetes mellitus without complications: Secondary | ICD-10-CM | POA: Diagnosis not present

## 2016-01-07 DIAGNOSIS — D509 Iron deficiency anemia, unspecified: Secondary | ICD-10-CM | POA: Diagnosis not present

## 2016-01-07 DIAGNOSIS — E119 Type 2 diabetes mellitus without complications: Secondary | ICD-10-CM | POA: Diagnosis not present

## 2016-01-07 DIAGNOSIS — N2581 Secondary hyperparathyroidism of renal origin: Secondary | ICD-10-CM | POA: Diagnosis not present

## 2016-01-07 DIAGNOSIS — D631 Anemia in chronic kidney disease: Secondary | ICD-10-CM | POA: Diagnosis not present

## 2016-01-07 DIAGNOSIS — E1129 Type 2 diabetes mellitus with other diabetic kidney complication: Secondary | ICD-10-CM | POA: Diagnosis not present

## 2016-01-07 DIAGNOSIS — N186 End stage renal disease: Secondary | ICD-10-CM | POA: Diagnosis not present

## 2016-01-09 DIAGNOSIS — N186 End stage renal disease: Secondary | ICD-10-CM | POA: Diagnosis not present

## 2016-01-09 DIAGNOSIS — D509 Iron deficiency anemia, unspecified: Secondary | ICD-10-CM | POA: Diagnosis not present

## 2016-01-09 DIAGNOSIS — Z992 Dependence on renal dialysis: Secondary | ICD-10-CM | POA: Diagnosis not present

## 2016-01-09 DIAGNOSIS — N2581 Secondary hyperparathyroidism of renal origin: Secondary | ICD-10-CM | POA: Diagnosis not present

## 2016-01-09 DIAGNOSIS — E119 Type 2 diabetes mellitus without complications: Secondary | ICD-10-CM | POA: Diagnosis not present

## 2016-01-09 DIAGNOSIS — D631 Anemia in chronic kidney disease: Secondary | ICD-10-CM | POA: Diagnosis not present

## 2016-01-09 DIAGNOSIS — E1129 Type 2 diabetes mellitus with other diabetic kidney complication: Secondary | ICD-10-CM | POA: Diagnosis not present

## 2016-01-12 DIAGNOSIS — N2581 Secondary hyperparathyroidism of renal origin: Secondary | ICD-10-CM | POA: Diagnosis not present

## 2016-01-12 DIAGNOSIS — D509 Iron deficiency anemia, unspecified: Secondary | ICD-10-CM | POA: Diagnosis not present

## 2016-01-12 DIAGNOSIS — E119 Type 2 diabetes mellitus without complications: Secondary | ICD-10-CM | POA: Diagnosis not present

## 2016-01-12 DIAGNOSIS — Z0389 Encounter for observation for other suspected diseases and conditions ruled out: Secondary | ICD-10-CM | POA: Diagnosis not present

## 2016-01-12 DIAGNOSIS — N186 End stage renal disease: Secondary | ICD-10-CM | POA: Diagnosis not present

## 2016-01-12 DIAGNOSIS — D631 Anemia in chronic kidney disease: Secondary | ICD-10-CM | POA: Diagnosis not present

## 2016-01-14 DIAGNOSIS — D509 Iron deficiency anemia, unspecified: Secondary | ICD-10-CM | POA: Diagnosis not present

## 2016-01-14 DIAGNOSIS — N2581 Secondary hyperparathyroidism of renal origin: Secondary | ICD-10-CM | POA: Diagnosis not present

## 2016-01-14 DIAGNOSIS — N186 End stage renal disease: Secondary | ICD-10-CM | POA: Diagnosis not present

## 2016-01-14 DIAGNOSIS — Z0389 Encounter for observation for other suspected diseases and conditions ruled out: Secondary | ICD-10-CM | POA: Diagnosis not present

## 2016-01-14 DIAGNOSIS — D631 Anemia in chronic kidney disease: Secondary | ICD-10-CM | POA: Diagnosis not present

## 2016-01-14 DIAGNOSIS — E119 Type 2 diabetes mellitus without complications: Secondary | ICD-10-CM | POA: Diagnosis not present

## 2016-01-17 DIAGNOSIS — Z0389 Encounter for observation for other suspected diseases and conditions ruled out: Secondary | ICD-10-CM | POA: Diagnosis not present

## 2016-01-17 DIAGNOSIS — D509 Iron deficiency anemia, unspecified: Secondary | ICD-10-CM | POA: Diagnosis not present

## 2016-01-17 DIAGNOSIS — E119 Type 2 diabetes mellitus without complications: Secondary | ICD-10-CM | POA: Diagnosis not present

## 2016-01-17 DIAGNOSIS — D631 Anemia in chronic kidney disease: Secondary | ICD-10-CM | POA: Diagnosis not present

## 2016-01-17 DIAGNOSIS — N186 End stage renal disease: Secondary | ICD-10-CM | POA: Diagnosis not present

## 2016-01-17 DIAGNOSIS — N2581 Secondary hyperparathyroidism of renal origin: Secondary | ICD-10-CM | POA: Diagnosis not present

## 2016-01-18 ENCOUNTER — Ambulatory Visit (INDEPENDENT_AMBULATORY_CARE_PROVIDER_SITE_OTHER): Payer: Medicare Other | Admitting: Internal Medicine

## 2016-01-18 ENCOUNTER — Encounter: Payer: Self-pay | Admitting: Internal Medicine

## 2016-01-18 ENCOUNTER — Other Ambulatory Visit (INDEPENDENT_AMBULATORY_CARE_PROVIDER_SITE_OTHER): Payer: Medicare Other

## 2016-01-18 VITALS — BP 120/78 | HR 90 | Wt 182.0 lb

## 2016-01-18 DIAGNOSIS — Z992 Dependence on renal dialysis: Secondary | ICD-10-CM

## 2016-01-18 DIAGNOSIS — E1122 Type 2 diabetes mellitus with diabetic chronic kidney disease: Secondary | ICD-10-CM | POA: Diagnosis not present

## 2016-01-18 DIAGNOSIS — R634 Abnormal weight loss: Secondary | ICD-10-CM | POA: Diagnosis not present

## 2016-01-18 DIAGNOSIS — N186 End stage renal disease: Secondary | ICD-10-CM

## 2016-01-18 DIAGNOSIS — I447 Left bundle-branch block, unspecified: Secondary | ICD-10-CM | POA: Diagnosis not present

## 2016-01-18 DIAGNOSIS — I255 Ischemic cardiomyopathy: Secondary | ICD-10-CM

## 2016-01-18 DIAGNOSIS — R202 Paresthesia of skin: Secondary | ICD-10-CM | POA: Diagnosis not present

## 2016-01-18 DIAGNOSIS — I5043 Acute on chronic combined systolic (congestive) and diastolic (congestive) heart failure: Secondary | ICD-10-CM

## 2016-01-18 LAB — CBC WITH DIFFERENTIAL/PLATELET
BASOS ABS: 0 10*3/uL (ref 0.0–0.1)
Basophils Relative: 0.4 % (ref 0.0–3.0)
Eosinophils Absolute: 0.1 10*3/uL (ref 0.0–0.7)
Eosinophils Relative: 1.5 % (ref 0.0–5.0)
HEMATOCRIT: 35.7 % — AB (ref 36.0–46.0)
HEMOGLOBIN: 11.5 g/dL — AB (ref 12.0–15.0)
LYMPHS PCT: 16.3 % (ref 12.0–46.0)
Lymphs Abs: 0.8 10*3/uL (ref 0.7–4.0)
MCHC: 32.2 g/dL (ref 30.0–36.0)
MCV: 84 fl (ref 78.0–100.0)
MONOS PCT: 9.9 % (ref 3.0–12.0)
Monocytes Absolute: 0.5 10*3/uL (ref 0.1–1.0)
NEUTROS ABS: 3.4 10*3/uL (ref 1.4–7.7)
Neutrophils Relative %: 71.9 % (ref 43.0–77.0)
Platelets: 183 10*3/uL (ref 150.0–400.0)
RBC: 4.26 Mil/uL (ref 3.87–5.11)
RDW: 19 % — ABNORMAL HIGH (ref 11.5–15.5)
WBC: 4.7 10*3/uL (ref 4.0–10.5)

## 2016-01-18 LAB — TSH: TSH: 1.44 u[IU]/mL (ref 0.35–4.50)

## 2016-01-18 LAB — COMPREHENSIVE METABOLIC PANEL
ALK PHOS: 85 U/L (ref 39–117)
ALT: 7 U/L (ref 0–35)
AST: 10 U/L (ref 0–37)
Albumin: 3.7 g/dL (ref 3.5–5.2)
BUN: 27 mg/dL — ABNORMAL HIGH (ref 6–23)
CO2: 32 meq/L (ref 19–32)
Calcium: 8.8 mg/dL (ref 8.4–10.5)
Chloride: 98 mEq/L (ref 96–112)
Creatinine, Ser: 4.15 mg/dL — ABNORMAL HIGH (ref 0.40–1.20)
GFR: 13.64 mL/min — CL (ref >60.00)
GLUCOSE: 120 mg/dL — AB (ref 70–99)
POTASSIUM: 4.1 meq/L (ref 3.5–5.1)
Sodium: 143 mEq/L (ref 135–145)
Total Bilirubin: 0.7 mg/dL (ref 0.2–1.2)
Total Protein: 7.1 g/dL (ref 6.0–8.3)

## 2016-01-18 LAB — VITAMIN B12: Vitamin B-12: 438 pg/mL (ref 211–911)

## 2016-01-18 MED ORDER — METOPROLOL TARTRATE 50 MG PO TABS: 50.0000 mg | ORAL_TABLET | Freq: Two times a day (BID) | ORAL | 11 refills | Status: DC

## 2016-01-18 NOTE — Assessment & Plan Note (Signed)
HD MWF °

## 2016-01-18 NOTE — Progress Notes (Signed)
Subjective:  Patient ID: [name], female    DOB: [birth year]  Age: 71 y.o. MRN: [id number]  CC: No chief complaint on file.   HPI CORLENE WISECARVER presents for ESRD, HTN, wt loss of 227-182=45 lbs. C/o poor appetite or less appetite. HD: M-W-F. Not depressed  Outpatient Medications Prior to Visit  Medication Sig Dispense Refill  . acetaminophen (TYLENOL) 500 MG tablet Take 500 mg by mouth every 6 (six) hours as needed (pain).    [name] amiodarone (PACERONE) 200 MG tablet Take 1 tablet (200 mg total) by mouth daily. 90 tablet 3  . aspirin 81 MG tablet Take 1 tablet (81 mg total) by mouth daily. 30 tablet 3  . BRILINTA 90 MG TABS tablet TAKE 1 TABLET BY MOUTH TWICE DAILY 60 tablet 9  . calcium acetate (PHOSLO) 667 MG capsule Take 1,334-2,001 mg by mouth See admin instructions. Take 2001mg  three times daily with meals and Take 1334mg   with snacks.    . dorzolamide-timolol (COSOPT) 22.3-6.8 MG/ML ophthalmic solution Place 1 drop into the left eye 2 (two) times daily.  12  . Fluticasone-Salmeterol (ADVAIR) 250-50 MCG/DOSE AEPB Inhale 1 puff into the lungs daily as needed (shortness of breath).     . metoprolol (LOPRESSOR) 50 MG tablet Take 1 tablet (50 mg total) by mouth 2 (two) times daily. Do not take this medication on dialysis days (Monday, Wednesday, Friday). 60 tablet 0  . nitroGLYCERIN (NITROSTAT) 0.4 MG SL tablet Place 1 tablet (0.4 mg total) under the tongue every 5 (five) minutes as needed for chest pain. 30 tablet 3  . repaglinide (PRANDIN) 0.5 MG tablet Take 1 tablet (0.5 mg total) by mouth 3 (three) times daily before meals. 90 tablet 11  . simvastatin (ZOCOR) 10 MG tablet Take 1 tablet (10 mg total) by mouth every evening. 90 tablet 3   Facility-Administered Medications Prior to Visit  Medication Dose Route Frequency Provider Last Rate Last Dose  . mitoMYcin (MUTAMYCIN) Injection Use in OR only (0.4 mg/ml)  0.5 mL Left Eye Once Marylynn Pearson, MD        ROS Review of Systems    Constitutional: Positive for fatigue and unexpected weight change. Negative for activity change, appetite change and chills.  HENT: Negative for congestion, mouth sores and sinus pressure.   Eyes: Negative for visual disturbance.  Respiratory: Negative for cough and chest tightness.   Gastrointestinal: Negative for abdominal pain and nausea.  Genitourinary: Negative for difficulty urinating, frequency and vaginal pain.  Musculoskeletal: Positive for arthralgias, back pain and gait problem.  Skin: Negative for pallor and rash.  Neurological: Negative for dizziness, tremors, weakness, numbness and headaches.  Psychiatric/Behavioral: Positive for decreased concentration. Negative for confusion, sleep disturbance and suicidal ideas.    Objective:  BP 120/78   Pulse 90   Wt 182 lb (82.6 kg)   SpO2 98%   BMI 35.54 kg/m   BP Readings from Last 3 Encounters:  01/18/16 120/78  11/18/15 (!) 131/58  09/28/15 126/76    Wt Readings from Last 3 Encounters:  01/18/16 182 lb (82.6 kg)  11/18/15 180 lb 5.4 oz (81.8 kg)  09/28/15 185 lb (83.9 kg)    Physical Exam  Constitutional: She appears well-developed. No distress.  HENT:  Head: Normocephalic.  Right Ear: External ear normal.  Left Ear: External ear normal.  Nose: Nose normal.  Mouth/Throat: Oropharynx is clear and moist.  Eyes: Conjunctivae are normal. Pupils are equal, round, and reactive to light. Right eye exhibits  no discharge. Left eye exhibits no discharge.  Neck: Normal range of motion. Neck supple. No JVD present. No tracheal deviation present. No thyromegaly present.  Cardiovascular: Normal rate, regular rhythm and normal heart sounds.   Pulmonary/Chest: No stridor. No respiratory distress. She has no wheezes.  Abdominal: Soft. Bowel sounds are normal. She exhibits no distension and no mass. There is no tenderness. There is no rebound and no guarding.  Musculoskeletal: She exhibits tenderness. She exhibits no edema.   Lymphadenopathy:    She has no cervical adenopathy.  Neurological: She displays normal reflexes. No cranial nerve deficit. She exhibits normal muscle tone. Coordination abnormal.  Skin: No rash noted. No erythema.  Psychiatric: She has a normal mood and affect. Her behavior is normal. Judgment and thought content normal.  in a w/c L arm shunt  Lab Results  Component Value Date   WBC 6.6 11/17/2015   HGB 10.4 (L) 11/17/2015   HCT 33.9 (L) 11/17/2015   PLT 224 11/17/2015   GLUCOSE 91 11/17/2015   CHOL 166 10/25/2014   TRIG 205 (H) 10/25/2014   HDL 27 (L) 10/25/2014   LDLCALC 98 10/25/2014   ALT 10 08/31/2015   AST 12 08/31/2015   NA 142 11/17/2015   K 3.3 (L) 11/17/2015   CL 96 (L) 11/17/2015   CREATININE 6.39 (H) 11/17/2015   BUN 45 (H) 11/17/2015   CO2 29 11/17/2015   TSH 1.06 08/10/2015   INR 1.19 10/24/2014   HGBA1C 6.8 05/13/2015    No results found.  Assessment & Plan:   There are no diagnoses linked to this encounter. I am having Ms. Clowney maintain her calcium acetate, Fluticasone-Salmeterol, repaglinide, acetaminophen, aspirin, nitroGLYCERIN, metoprolol, BRILINTA, simvastatin, amiodarone, and dorzolamide-timolol.  No orders of the defined types were placed in this encounter.    Follow-up: No Follow-up on file.  Walker Kehr, MD

## 2016-01-18 NOTE — Assessment & Plan Note (Signed)
Cont w/ Amiodarone, Berlinta, Toprol

## 2016-01-18 NOTE — Assessment & Plan Note (Signed)
On Prandin Dr Loanne Drilling

## 2016-01-18 NOTE — Assessment & Plan Note (Signed)
Could be multifactorial Labs

## 2016-01-18 NOTE — Assessment & Plan Note (Signed)
On Amiodarone, Berlinta, Toprol, Zocor

## 2016-01-18 NOTE — Progress Notes (Signed)
Pre visit review using our clinic review tool, if applicable. No additional management support is needed unless otherwise documented below in the visit note. 

## 2016-01-19 DIAGNOSIS — D509 Iron deficiency anemia, unspecified: Secondary | ICD-10-CM | POA: Diagnosis not present

## 2016-01-19 DIAGNOSIS — E119 Type 2 diabetes mellitus without complications: Secondary | ICD-10-CM | POA: Diagnosis not present

## 2016-01-19 DIAGNOSIS — N186 End stage renal disease: Secondary | ICD-10-CM | POA: Diagnosis not present

## 2016-01-19 DIAGNOSIS — N2581 Secondary hyperparathyroidism of renal origin: Secondary | ICD-10-CM | POA: Diagnosis not present

## 2016-01-19 DIAGNOSIS — D631 Anemia in chronic kidney disease: Secondary | ICD-10-CM | POA: Diagnosis not present

## 2016-01-19 DIAGNOSIS — Z0389 Encounter for observation for other suspected diseases and conditions ruled out: Secondary | ICD-10-CM | POA: Diagnosis not present

## 2016-01-21 DIAGNOSIS — N2581 Secondary hyperparathyroidism of renal origin: Secondary | ICD-10-CM | POA: Diagnosis not present

## 2016-01-21 DIAGNOSIS — D509 Iron deficiency anemia, unspecified: Secondary | ICD-10-CM | POA: Diagnosis not present

## 2016-01-21 DIAGNOSIS — N186 End stage renal disease: Secondary | ICD-10-CM | POA: Diagnosis not present

## 2016-01-21 DIAGNOSIS — Z0389 Encounter for observation for other suspected diseases and conditions ruled out: Secondary | ICD-10-CM | POA: Diagnosis not present

## 2016-01-21 DIAGNOSIS — E119 Type 2 diabetes mellitus without complications: Secondary | ICD-10-CM | POA: Diagnosis not present

## 2016-01-21 DIAGNOSIS — D631 Anemia in chronic kidney disease: Secondary | ICD-10-CM | POA: Diagnosis not present

## 2016-01-24 DIAGNOSIS — D509 Iron deficiency anemia, unspecified: Secondary | ICD-10-CM | POA: Diagnosis not present

## 2016-01-24 DIAGNOSIS — N186 End stage renal disease: Secondary | ICD-10-CM | POA: Diagnosis not present

## 2016-01-24 DIAGNOSIS — D631 Anemia in chronic kidney disease: Secondary | ICD-10-CM | POA: Diagnosis not present

## 2016-01-24 DIAGNOSIS — N2581 Secondary hyperparathyroidism of renal origin: Secondary | ICD-10-CM | POA: Diagnosis not present

## 2016-01-24 DIAGNOSIS — E119 Type 2 diabetes mellitus without complications: Secondary | ICD-10-CM | POA: Diagnosis not present

## 2016-01-24 DIAGNOSIS — Z0389 Encounter for observation for other suspected diseases and conditions ruled out: Secondary | ICD-10-CM | POA: Diagnosis not present

## 2016-01-26 DIAGNOSIS — N2581 Secondary hyperparathyroidism of renal origin: Secondary | ICD-10-CM | POA: Diagnosis not present

## 2016-01-26 DIAGNOSIS — D509 Iron deficiency anemia, unspecified: Secondary | ICD-10-CM | POA: Diagnosis not present

## 2016-01-26 DIAGNOSIS — N186 End stage renal disease: Secondary | ICD-10-CM | POA: Diagnosis not present

## 2016-01-26 DIAGNOSIS — D631 Anemia in chronic kidney disease: Secondary | ICD-10-CM | POA: Diagnosis not present

## 2016-01-26 DIAGNOSIS — E119 Type 2 diabetes mellitus without complications: Secondary | ICD-10-CM | POA: Diagnosis not present

## 2016-01-26 DIAGNOSIS — Z0389 Encounter for observation for other suspected diseases and conditions ruled out: Secondary | ICD-10-CM | POA: Diagnosis not present

## 2016-01-26 DIAGNOSIS — Z114 Encounter for screening for human immunodeficiency virus [HIV]: Secondary | ICD-10-CM | POA: Diagnosis not present

## 2016-01-28 DIAGNOSIS — N2581 Secondary hyperparathyroidism of renal origin: Secondary | ICD-10-CM | POA: Diagnosis not present

## 2016-01-28 DIAGNOSIS — Z0389 Encounter for observation for other suspected diseases and conditions ruled out: Secondary | ICD-10-CM | POA: Diagnosis not present

## 2016-01-28 DIAGNOSIS — E119 Type 2 diabetes mellitus without complications: Secondary | ICD-10-CM | POA: Diagnosis not present

## 2016-01-28 DIAGNOSIS — D631 Anemia in chronic kidney disease: Secondary | ICD-10-CM | POA: Diagnosis not present

## 2016-01-28 DIAGNOSIS — D509 Iron deficiency anemia, unspecified: Secondary | ICD-10-CM | POA: Diagnosis not present

## 2016-01-28 DIAGNOSIS — N186 End stage renal disease: Secondary | ICD-10-CM | POA: Diagnosis not present

## 2016-01-31 DIAGNOSIS — N186 End stage renal disease: Secondary | ICD-10-CM | POA: Diagnosis not present

## 2016-01-31 DIAGNOSIS — N2581 Secondary hyperparathyroidism of renal origin: Secondary | ICD-10-CM | POA: Diagnosis not present

## 2016-01-31 DIAGNOSIS — E119 Type 2 diabetes mellitus without complications: Secondary | ICD-10-CM | POA: Diagnosis not present

## 2016-01-31 DIAGNOSIS — D631 Anemia in chronic kidney disease: Secondary | ICD-10-CM | POA: Diagnosis not present

## 2016-01-31 DIAGNOSIS — Z0389 Encounter for observation for other suspected diseases and conditions ruled out: Secondary | ICD-10-CM | POA: Diagnosis not present

## 2016-01-31 DIAGNOSIS — Z114 Encounter for screening for human immunodeficiency virus [HIV]: Secondary | ICD-10-CM | POA: Diagnosis not present

## 2016-01-31 DIAGNOSIS — D509 Iron deficiency anemia, unspecified: Secondary | ICD-10-CM | POA: Diagnosis not present

## 2016-02-02 DIAGNOSIS — D509 Iron deficiency anemia, unspecified: Secondary | ICD-10-CM | POA: Diagnosis not present

## 2016-02-02 DIAGNOSIS — Z0389 Encounter for observation for other suspected diseases and conditions ruled out: Secondary | ICD-10-CM | POA: Diagnosis not present

## 2016-02-02 DIAGNOSIS — E1129 Type 2 diabetes mellitus with other diabetic kidney complication: Secondary | ICD-10-CM | POA: Diagnosis not present

## 2016-02-02 DIAGNOSIS — N186 End stage renal disease: Secondary | ICD-10-CM | POA: Diagnosis not present

## 2016-02-02 DIAGNOSIS — N2581 Secondary hyperparathyroidism of renal origin: Secondary | ICD-10-CM | POA: Diagnosis not present

## 2016-02-02 DIAGNOSIS — E119 Type 2 diabetes mellitus without complications: Secondary | ICD-10-CM | POA: Diagnosis not present

## 2016-02-02 DIAGNOSIS — D631 Anemia in chronic kidney disease: Secondary | ICD-10-CM | POA: Diagnosis not present

## 2016-02-04 DIAGNOSIS — D509 Iron deficiency anemia, unspecified: Secondary | ICD-10-CM | POA: Diagnosis not present

## 2016-02-04 DIAGNOSIS — E119 Type 2 diabetes mellitus without complications: Secondary | ICD-10-CM | POA: Diagnosis not present

## 2016-02-04 DIAGNOSIS — Z0389 Encounter for observation for other suspected diseases and conditions ruled out: Secondary | ICD-10-CM | POA: Diagnosis not present

## 2016-02-04 DIAGNOSIS — N2581 Secondary hyperparathyroidism of renal origin: Secondary | ICD-10-CM | POA: Diagnosis not present

## 2016-02-04 DIAGNOSIS — N186 End stage renal disease: Secondary | ICD-10-CM | POA: Diagnosis not present

## 2016-02-04 DIAGNOSIS — D631 Anemia in chronic kidney disease: Secondary | ICD-10-CM | POA: Diagnosis not present

## 2016-02-07 DIAGNOSIS — N2581 Secondary hyperparathyroidism of renal origin: Secondary | ICD-10-CM | POA: Diagnosis not present

## 2016-02-07 DIAGNOSIS — Z0389 Encounter for observation for other suspected diseases and conditions ruled out: Secondary | ICD-10-CM | POA: Diagnosis not present

## 2016-02-07 DIAGNOSIS — D631 Anemia in chronic kidney disease: Secondary | ICD-10-CM | POA: Diagnosis not present

## 2016-02-07 DIAGNOSIS — E119 Type 2 diabetes mellitus without complications: Secondary | ICD-10-CM | POA: Diagnosis not present

## 2016-02-07 DIAGNOSIS — D509 Iron deficiency anemia, unspecified: Secondary | ICD-10-CM | POA: Diagnosis not present

## 2016-02-07 DIAGNOSIS — N186 End stage renal disease: Secondary | ICD-10-CM | POA: Diagnosis not present

## 2016-02-08 ENCOUNTER — Ambulatory Visit (INDEPENDENT_AMBULATORY_CARE_PROVIDER_SITE_OTHER): Payer: Medicare Other | Admitting: Internal Medicine

## 2016-02-08 ENCOUNTER — Encounter: Payer: Self-pay | Admitting: Internal Medicine

## 2016-02-08 DIAGNOSIS — N184 Chronic kidney disease, stage 4 (severe): Secondary | ICD-10-CM | POA: Diagnosis not present

## 2016-02-08 DIAGNOSIS — N186 End stage renal disease: Secondary | ICD-10-CM | POA: Diagnosis not present

## 2016-02-08 DIAGNOSIS — I255 Ischemic cardiomyopathy: Secondary | ICD-10-CM

## 2016-02-08 DIAGNOSIS — Z992 Dependence on renal dialysis: Secondary | ICD-10-CM

## 2016-02-08 DIAGNOSIS — I2510 Atherosclerotic heart disease of native coronary artery without angina pectoris: Secondary | ICD-10-CM | POA: Diagnosis not present

## 2016-02-08 DIAGNOSIS — I5043 Acute on chronic combined systolic (congestive) and diastolic (congestive) heart failure: Secondary | ICD-10-CM

## 2016-02-08 DIAGNOSIS — D631 Anemia in chronic kidney disease: Secondary | ICD-10-CM

## 2016-02-08 DIAGNOSIS — K219 Gastro-esophageal reflux disease without esophagitis: Secondary | ICD-10-CM

## 2016-02-08 DIAGNOSIS — R110 Nausea: Secondary | ICD-10-CM

## 2016-02-08 MED ORDER — FAMOTIDINE 20 MG PO TABS: 20.0000 mg | ORAL_TABLET | Freq: Two times a day (BID) | ORAL | 11 refills | Status: DC

## 2016-02-08 NOTE — Assessment & Plan Note (Signed)
Due to ESRD

## 2016-02-08 NOTE — Assessment & Plan Note (Signed)
Pepcid prn.

## 2016-02-08 NOTE — Patient Instructions (Signed)
Come back sooner please if problems

## 2016-02-08 NOTE — Assessment & Plan Note (Signed)
One episode - resolved  Hold simvastatin x 2 d Pepcid po

## 2016-02-08 NOTE — Assessment & Plan Note (Signed)
ASA 81, Brilinta, Amiodarone, Toprol, NTG prn, Norvasc, Zocor

## 2016-02-08 NOTE — Progress Notes (Signed)
Subjective:  Patient ID: [name], female    DOB: 1945-11-25  Age: 71 y.o. MRN: [id number]  CC: No chief complaint on file.   HPI [facility] presents for ESRD, HTN f/u C/o GERD - OTC meds help.   Outpatient Medications Prior to Visit  Medication Sig Dispense Refill  . acetaminophen (TYLENOL) 500 MG tablet Take 500 mg by mouth every 6 (six) hours as needed (pain).    [name] amiodarone (PACERONE) 200 MG tablet Take 1 tablet (200 mg total) by mouth daily. 90 tablet 3  . aspirin 81 MG tablet Take 1 tablet (81 mg total) by mouth daily. 30 tablet 3  . BRILINTA 90 MG TABS tablet TAKE 1 TABLET BY MOUTH TWICE DAILY 60 tablet 9  . calcium acetate (PHOSLO) 667 MG capsule Take 1,334-2,001 mg by mouth See admin instructions. Take 2001mg  three times daily with meals and Take 1334mg   with snacks.    . dorzolamide-timolol (COSOPT) 22.3-6.8 MG/ML ophthalmic solution Place 1 drop into the left eye 2 (two) times daily.  12  . Fluticasone-Salmeterol (ADVAIR) 250-50 MCG/DOSE AEPB Inhale 1 puff into the lungs daily as needed (shortness of breath).     . metoprolol (LOPRESSOR) 50 MG tablet Take 1 tablet (50 mg total) by mouth 2 (two) times daily. Do not take this medication on dialysis days (Monday, Wednesday, Friday). 60 tablet 11  . nitroGLYCERIN (NITROSTAT) 0.4 MG SL tablet Place 1 tablet (0.4 mg total) under the tongue every 5 (five) minutes as needed for chest pain. 30 tablet 3  . repaglinide (PRANDIN) 0.5 MG tablet Take 1 tablet (0.5 mg total) by mouth 3 (three) times daily before meals. 90 tablet 11  . simvastatin (ZOCOR) 10 MG tablet Take 1 tablet (10 mg total) by mouth every evening. 90 tablet 3   Facility-Administered Medications Prior to Visit  Medication Dose Route Frequency Provider Last Rate Last Dose  . mitoMYcin (MUTAMYCIN) Injection Use in OR only (0.4 mg/ml)  0.5 mL Left Eye Once Marylynn Pearson, MD        ROS Review of Systems  Constitutional: Positive for fatigue. Negative for  activity change, appetite change, chills and unexpected weight change.  HENT: Negative for congestion, mouth sores and sinus pressure.   Eyes: Negative for visual disturbance.  Respiratory: Negative for cough and chest tightness.   Gastrointestinal: Positive for nausea. Negative for abdominal pain and constipation.  Genitourinary: Negative for difficulty urinating, frequency and vaginal pain.  Musculoskeletal: Positive for gait problem. Negative for back pain.  Skin: Negative for pallor and rash.  Neurological: Negative for dizziness, tremors, weakness, numbness and headaches.  Psychiatric/Behavioral: Negative for confusion, sleep disturbance and suicidal ideas.    Objective:  BP 106/62   Pulse (!) 106   Temp 98.1 F (36.7 C) (Oral)   Resp 12   Ht 5' (1.524 m)   Wt 186 lb 6.4 oz (84.6 kg)   SpO2 97%   BMI 36.40 kg/m   BP Readings from Last 3 Encounters:  02/08/16 106/62  01/18/16 120/78  11/18/15 (!) 131/58    Wt Readings from Last 3 Encounters:  02/08/16 186 lb 6.4 oz (84.6 kg)  01/18/16 182 lb (82.6 kg)  11/18/15 180 lb 5.4 oz (81.8 kg)    Physical Exam  Constitutional: She appears well-developed. No distress.  HENT:  Head: Normocephalic.  Right Ear: External ear normal.  Left Ear: External ear normal.  Nose: Nose normal.  Mouth/Throat: Oropharynx is clear and moist.  Eyes: Conjunctivae  are normal. Pupils are equal, round, and reactive to light. Right eye exhibits no discharge. Left eye exhibits no discharge.  Neck: Normal range of motion. Neck supple. No JVD present. No tracheal deviation present. No thyromegaly present.  Cardiovascular: Normal rate, regular rhythm and normal heart sounds.   Pulmonary/Chest: No stridor. No respiratory distress. She has no wheezes.  Abdominal: Soft. Bowel sounds are normal. She exhibits no distension and no mass. There is no tenderness. There is no rebound and no guarding.  Musculoskeletal: She exhibits no edema or tenderness.    Lymphadenopathy:    She has no cervical adenopathy.  Neurological: She displays normal reflexes. No cranial nerve deficit. She exhibits normal muscle tone. Coordination normal.  Skin: No rash noted. No erythema.  Psychiatric: She has a normal mood and affect. Her behavior is normal. Judgment and thought content normal.    Lab Results  Component Value Date   WBC 4.7 01/18/2016   HGB 11.5 (L) 01/18/2016   HCT 35.7 (L) 01/18/2016   PLT 183.0 01/18/2016   GLUCOSE 120 (H) 01/18/2016   CHOL 166 10/25/2014   TRIG 205 (H) 10/25/2014   HDL 27 (L) 10/25/2014   LDLCALC 98 10/25/2014   ALT 7 01/18/2016   AST 10 01/18/2016   NA 143 01/18/2016   K 4.1 01/18/2016   CL 98 01/18/2016   CREATININE 4.15 (H) 01/18/2016   BUN 27 (H) 01/18/2016   CO2 32 01/18/2016   TSH 1.44 01/18/2016   INR 1.19 10/24/2014   HGBA1C 6.8 05/13/2015    No results found.  Assessment & Plan:   There are no diagnoses linked to this encounter. I am having [name] maintain her calcium acetate, Fluticasone-Salmeterol, repaglinide, acetaminophen, aspirin, nitroGLYCERIN, BRILINTA, simvastatin, amiodarone, dorzolamide-timolol, and metoprolol.  No orders of the defined types were placed in this encounter.    Follow-up: No Follow-up on file.  Walker Kehr, MD

## 2016-02-08 NOTE — Assessment & Plan Note (Signed)
On HD MWF Cont w/ Amiodarone, Berlinta, Toprol

## 2016-02-08 NOTE — Progress Notes (Signed)
Pre visit review using our clinic review tool, if applicable. No additional management support is needed unless otherwise documented below in the visit note. 

## 2016-02-08 NOTE — Assessment & Plan Note (Signed)
On HD MWF

## 2016-02-09 DIAGNOSIS — E1129 Type 2 diabetes mellitus with other diabetic kidney complication: Secondary | ICD-10-CM | POA: Diagnosis not present

## 2016-02-09 DIAGNOSIS — E119 Type 2 diabetes mellitus without complications: Secondary | ICD-10-CM | POA: Diagnosis not present

## 2016-02-09 DIAGNOSIS — Z0389 Encounter for observation for other suspected diseases and conditions ruled out: Secondary | ICD-10-CM | POA: Diagnosis not present

## 2016-02-09 DIAGNOSIS — D509 Iron deficiency anemia, unspecified: Secondary | ICD-10-CM | POA: Diagnosis not present

## 2016-02-09 DIAGNOSIS — D631 Anemia in chronic kidney disease: Secondary | ICD-10-CM | POA: Diagnosis not present

## 2016-02-09 DIAGNOSIS — Z992 Dependence on renal dialysis: Secondary | ICD-10-CM | POA: Diagnosis not present

## 2016-02-09 DIAGNOSIS — N186 End stage renal disease: Secondary | ICD-10-CM | POA: Diagnosis not present

## 2016-02-09 DIAGNOSIS — N2581 Secondary hyperparathyroidism of renal origin: Secondary | ICD-10-CM | POA: Diagnosis not present

## 2016-02-11 DIAGNOSIS — D631 Anemia in chronic kidney disease: Secondary | ICD-10-CM | POA: Diagnosis not present

## 2016-02-11 DIAGNOSIS — D509 Iron deficiency anemia, unspecified: Secondary | ICD-10-CM | POA: Diagnosis not present

## 2016-02-11 DIAGNOSIS — E119 Type 2 diabetes mellitus without complications: Secondary | ICD-10-CM | POA: Diagnosis not present

## 2016-02-11 DIAGNOSIS — N2581 Secondary hyperparathyroidism of renal origin: Secondary | ICD-10-CM | POA: Diagnosis not present

## 2016-02-11 DIAGNOSIS — N186 End stage renal disease: Secondary | ICD-10-CM | POA: Diagnosis not present

## 2016-02-14 DIAGNOSIS — D631 Anemia in chronic kidney disease: Secondary | ICD-10-CM | POA: Diagnosis not present

## 2016-02-14 DIAGNOSIS — D509 Iron deficiency anemia, unspecified: Secondary | ICD-10-CM | POA: Diagnosis not present

## 2016-02-14 DIAGNOSIS — N2581 Secondary hyperparathyroidism of renal origin: Secondary | ICD-10-CM | POA: Diagnosis not present

## 2016-02-14 DIAGNOSIS — E119 Type 2 diabetes mellitus without complications: Secondary | ICD-10-CM | POA: Diagnosis not present

## 2016-02-14 DIAGNOSIS — N186 End stage renal disease: Secondary | ICD-10-CM | POA: Diagnosis not present

## 2016-02-16 DIAGNOSIS — D631 Anemia in chronic kidney disease: Secondary | ICD-10-CM | POA: Diagnosis not present

## 2016-02-16 DIAGNOSIS — E119 Type 2 diabetes mellitus without complications: Secondary | ICD-10-CM | POA: Diagnosis not present

## 2016-02-16 DIAGNOSIS — D509 Iron deficiency anemia, unspecified: Secondary | ICD-10-CM | POA: Diagnosis not present

## 2016-02-16 DIAGNOSIS — N186 End stage renal disease: Secondary | ICD-10-CM | POA: Diagnosis not present

## 2016-02-16 DIAGNOSIS — N2581 Secondary hyperparathyroidism of renal origin: Secondary | ICD-10-CM | POA: Diagnosis not present

## 2016-02-18 DIAGNOSIS — N2581 Secondary hyperparathyroidism of renal origin: Secondary | ICD-10-CM | POA: Diagnosis not present

## 2016-02-18 DIAGNOSIS — D509 Iron deficiency anemia, unspecified: Secondary | ICD-10-CM | POA: Diagnosis not present

## 2016-02-18 DIAGNOSIS — N186 End stage renal disease: Secondary | ICD-10-CM | POA: Diagnosis not present

## 2016-02-18 DIAGNOSIS — D631 Anemia in chronic kidney disease: Secondary | ICD-10-CM | POA: Diagnosis not present

## 2016-02-18 DIAGNOSIS — E119 Type 2 diabetes mellitus without complications: Secondary | ICD-10-CM | POA: Diagnosis not present

## 2016-02-20 ENCOUNTER — Other Ambulatory Visit: Payer: Self-pay | Admitting: Endocrinology

## 2016-02-21 DIAGNOSIS — N2581 Secondary hyperparathyroidism of renal origin: Secondary | ICD-10-CM | POA: Diagnosis not present

## 2016-02-21 DIAGNOSIS — N186 End stage renal disease: Secondary | ICD-10-CM | POA: Diagnosis not present

## 2016-02-21 DIAGNOSIS — D509 Iron deficiency anemia, unspecified: Secondary | ICD-10-CM | POA: Diagnosis not present

## 2016-02-21 DIAGNOSIS — E119 Type 2 diabetes mellitus without complications: Secondary | ICD-10-CM | POA: Diagnosis not present

## 2016-02-21 DIAGNOSIS — D631 Anemia in chronic kidney disease: Secondary | ICD-10-CM | POA: Diagnosis not present

## 2016-02-23 DIAGNOSIS — N2581 Secondary hyperparathyroidism of renal origin: Secondary | ICD-10-CM | POA: Diagnosis not present

## 2016-02-23 DIAGNOSIS — D631 Anemia in chronic kidney disease: Secondary | ICD-10-CM | POA: Diagnosis not present

## 2016-02-23 DIAGNOSIS — N186 End stage renal disease: Secondary | ICD-10-CM | POA: Diagnosis not present

## 2016-02-23 DIAGNOSIS — D509 Iron deficiency anemia, unspecified: Secondary | ICD-10-CM | POA: Diagnosis not present

## 2016-02-23 DIAGNOSIS — E119 Type 2 diabetes mellitus without complications: Secondary | ICD-10-CM | POA: Diagnosis not present

## 2016-02-25 DIAGNOSIS — N2581 Secondary hyperparathyroidism of renal origin: Secondary | ICD-10-CM | POA: Diagnosis not present

## 2016-02-25 DIAGNOSIS — D509 Iron deficiency anemia, unspecified: Secondary | ICD-10-CM | POA: Diagnosis not present

## 2016-02-25 DIAGNOSIS — E119 Type 2 diabetes mellitus without complications: Secondary | ICD-10-CM | POA: Diagnosis not present

## 2016-02-25 DIAGNOSIS — D631 Anemia in chronic kidney disease: Secondary | ICD-10-CM | POA: Diagnosis not present

## 2016-02-25 DIAGNOSIS — N186 End stage renal disease: Secondary | ICD-10-CM | POA: Diagnosis not present

## 2016-02-28 DIAGNOSIS — N186 End stage renal disease: Secondary | ICD-10-CM | POA: Diagnosis not present

## 2016-02-28 DIAGNOSIS — D509 Iron deficiency anemia, unspecified: Secondary | ICD-10-CM | POA: Diagnosis not present

## 2016-02-28 DIAGNOSIS — N2581 Secondary hyperparathyroidism of renal origin: Secondary | ICD-10-CM | POA: Diagnosis not present

## 2016-02-28 DIAGNOSIS — E119 Type 2 diabetes mellitus without complications: Secondary | ICD-10-CM | POA: Diagnosis not present

## 2016-02-28 DIAGNOSIS — D631 Anemia in chronic kidney disease: Secondary | ICD-10-CM | POA: Diagnosis not present

## 2016-02-29 DIAGNOSIS — I739 Peripheral vascular disease, unspecified: Secondary | ICD-10-CM | POA: Diagnosis not present

## 2016-02-29 DIAGNOSIS — E1151 Type 2 diabetes mellitus with diabetic peripheral angiopathy without gangrene: Secondary | ICD-10-CM | POA: Diagnosis not present

## 2016-02-29 DIAGNOSIS — L603 Nail dystrophy: Secondary | ICD-10-CM | POA: Diagnosis not present

## 2016-03-01 DIAGNOSIS — N186 End stage renal disease: Secondary | ICD-10-CM | POA: Diagnosis not present

## 2016-03-01 DIAGNOSIS — D631 Anemia in chronic kidney disease: Secondary | ICD-10-CM | POA: Diagnosis not present

## 2016-03-01 DIAGNOSIS — E119 Type 2 diabetes mellitus without complications: Secondary | ICD-10-CM | POA: Diagnosis not present

## 2016-03-01 DIAGNOSIS — N2581 Secondary hyperparathyroidism of renal origin: Secondary | ICD-10-CM | POA: Diagnosis not present

## 2016-03-01 DIAGNOSIS — D509 Iron deficiency anemia, unspecified: Secondary | ICD-10-CM | POA: Diagnosis not present

## 2016-03-03 DIAGNOSIS — D631 Anemia in chronic kidney disease: Secondary | ICD-10-CM | POA: Diagnosis not present

## 2016-03-03 DIAGNOSIS — N186 End stage renal disease: Secondary | ICD-10-CM | POA: Diagnosis not present

## 2016-03-03 DIAGNOSIS — N2581 Secondary hyperparathyroidism of renal origin: Secondary | ICD-10-CM | POA: Diagnosis not present

## 2016-03-03 DIAGNOSIS — E119 Type 2 diabetes mellitus without complications: Secondary | ICD-10-CM | POA: Diagnosis not present

## 2016-03-03 DIAGNOSIS — D509 Iron deficiency anemia, unspecified: Secondary | ICD-10-CM | POA: Diagnosis not present

## 2016-03-06 DIAGNOSIS — D509 Iron deficiency anemia, unspecified: Secondary | ICD-10-CM | POA: Diagnosis not present

## 2016-03-06 DIAGNOSIS — E119 Type 2 diabetes mellitus without complications: Secondary | ICD-10-CM | POA: Diagnosis not present

## 2016-03-06 DIAGNOSIS — N186 End stage renal disease: Secondary | ICD-10-CM | POA: Diagnosis not present

## 2016-03-06 DIAGNOSIS — N2581 Secondary hyperparathyroidism of renal origin: Secondary | ICD-10-CM | POA: Diagnosis not present

## 2016-03-06 DIAGNOSIS — D631 Anemia in chronic kidney disease: Secondary | ICD-10-CM | POA: Diagnosis not present

## 2016-03-08 DIAGNOSIS — E1129 Type 2 diabetes mellitus with other diabetic kidney complication: Secondary | ICD-10-CM | POA: Diagnosis not present

## 2016-03-08 DIAGNOSIS — N2581 Secondary hyperparathyroidism of renal origin: Secondary | ICD-10-CM | POA: Diagnosis not present

## 2016-03-08 DIAGNOSIS — D509 Iron deficiency anemia, unspecified: Secondary | ICD-10-CM | POA: Diagnosis not present

## 2016-03-08 DIAGNOSIS — N186 End stage renal disease: Secondary | ICD-10-CM | POA: Diagnosis not present

## 2016-03-08 DIAGNOSIS — E119 Type 2 diabetes mellitus without complications: Secondary | ICD-10-CM | POA: Diagnosis not present

## 2016-03-08 DIAGNOSIS — D631 Anemia in chronic kidney disease: Secondary | ICD-10-CM | POA: Diagnosis not present

## 2016-03-08 DIAGNOSIS — Z992 Dependence on renal dialysis: Secondary | ICD-10-CM | POA: Diagnosis not present

## 2016-03-10 DIAGNOSIS — E119 Type 2 diabetes mellitus without complications: Secondary | ICD-10-CM | POA: Diagnosis not present

## 2016-03-10 DIAGNOSIS — N2581 Secondary hyperparathyroidism of renal origin: Secondary | ICD-10-CM | POA: Diagnosis not present

## 2016-03-10 DIAGNOSIS — D509 Iron deficiency anemia, unspecified: Secondary | ICD-10-CM | POA: Diagnosis not present

## 2016-03-10 DIAGNOSIS — E1129 Type 2 diabetes mellitus with other diabetic kidney complication: Secondary | ICD-10-CM | POA: Diagnosis not present

## 2016-03-10 DIAGNOSIS — D631 Anemia in chronic kidney disease: Secondary | ICD-10-CM | POA: Diagnosis not present

## 2016-03-10 DIAGNOSIS — N186 End stage renal disease: Secondary | ICD-10-CM | POA: Diagnosis not present

## 2016-03-13 DIAGNOSIS — E1129 Type 2 diabetes mellitus with other diabetic kidney complication: Secondary | ICD-10-CM | POA: Diagnosis not present

## 2016-03-13 DIAGNOSIS — D631 Anemia in chronic kidney disease: Secondary | ICD-10-CM | POA: Diagnosis not present

## 2016-03-13 DIAGNOSIS — D509 Iron deficiency anemia, unspecified: Secondary | ICD-10-CM | POA: Diagnosis not present

## 2016-03-13 DIAGNOSIS — E119 Type 2 diabetes mellitus without complications: Secondary | ICD-10-CM | POA: Diagnosis not present

## 2016-03-13 DIAGNOSIS — N186 End stage renal disease: Secondary | ICD-10-CM | POA: Diagnosis not present

## 2016-03-13 DIAGNOSIS — N2581 Secondary hyperparathyroidism of renal origin: Secondary | ICD-10-CM | POA: Diagnosis not present

## 2016-03-14 DIAGNOSIS — I871 Compression of vein: Secondary | ICD-10-CM | POA: Diagnosis not present

## 2016-03-14 DIAGNOSIS — T82858A Stenosis of vascular prosthetic devices, implants and grafts, initial encounter: Secondary | ICD-10-CM | POA: Diagnosis not present

## 2016-03-14 DIAGNOSIS — Z992 Dependence on renal dialysis: Secondary | ICD-10-CM | POA: Diagnosis not present

## 2016-03-14 DIAGNOSIS — N186 End stage renal disease: Secondary | ICD-10-CM | POA: Diagnosis not present

## 2016-03-15 DIAGNOSIS — E119 Type 2 diabetes mellitus without complications: Secondary | ICD-10-CM | POA: Diagnosis not present

## 2016-03-15 DIAGNOSIS — E1129 Type 2 diabetes mellitus with other diabetic kidney complication: Secondary | ICD-10-CM | POA: Diagnosis not present

## 2016-03-15 DIAGNOSIS — N2581 Secondary hyperparathyroidism of renal origin: Secondary | ICD-10-CM | POA: Diagnosis not present

## 2016-03-15 DIAGNOSIS — D509 Iron deficiency anemia, unspecified: Secondary | ICD-10-CM | POA: Diagnosis not present

## 2016-03-15 DIAGNOSIS — D631 Anemia in chronic kidney disease: Secondary | ICD-10-CM | POA: Diagnosis not present

## 2016-03-15 DIAGNOSIS — N186 End stage renal disease: Secondary | ICD-10-CM | POA: Diagnosis not present

## 2016-03-16 ENCOUNTER — Encounter: Payer: Self-pay | Admitting: Endocrinology

## 2016-03-16 ENCOUNTER — Ambulatory Visit (INDEPENDENT_AMBULATORY_CARE_PROVIDER_SITE_OTHER): Payer: Medicare Other | Admitting: Endocrinology

## 2016-03-16 VITALS — BP 124/80 | HR 88 | Ht 60.0 in | Wt 181.0 lb

## 2016-03-16 DIAGNOSIS — N186 End stage renal disease: Secondary | ICD-10-CM

## 2016-03-16 DIAGNOSIS — Z992 Dependence on renal dialysis: Secondary | ICD-10-CM | POA: Diagnosis not present

## 2016-03-16 DIAGNOSIS — E1122 Type 2 diabetes mellitus with diabetic chronic kidney disease: Secondary | ICD-10-CM

## 2016-03-16 DIAGNOSIS — I255 Ischemic cardiomyopathy: Secondary | ICD-10-CM | POA: Diagnosis not present

## 2016-03-16 LAB — POCT GLYCOSYLATED HEMOGLOBIN (HGB A1C): HEMOGLOBIN A1C: 6.6

## 2016-03-16 MED ORDER — REPAGLINIDE 0.5 MG PO TABS: ORAL_TABLET | ORAL | 3 refills | Status: DC

## 2016-03-16 MED ORDER — GLUCOSE BLOOD VI STRP: 1.0000 | ORAL_STRIP | Freq: Every day | 3 refills | Status: AC

## 2016-03-16 NOTE — Progress Notes (Signed)
Subjective:    Patient ID: [name], female    DOB: 1945/03/24, 71 y.o.   MRN: [id number]  HPI Pt returns for f/u of diabetes mellitus: DM type: Insulin-requiring type 2 Dx'ed: 2637 Complications: polyneuropathy, renal failure (dialysis), PAD, CVA, CHF, and right foot transmetatarsal amputation.   Therapy: repaglinide. GDM: never DKA: never Severe hypoglycemia: last episode was 2013 Pancreatitis: never Other: she took insulin from 2007-2017 Interval history: no cbg record, but states cbg's are well-controlled.  pt states she feels well in general.   Past Medical History:  Diagnosis Date  . Anemia   . Arthritis    "hands" (11/17/2015)  . Atrial tachycardia ([place])    on amiod  . Cardiomyopathy- mixed   . CHF (congestive heart failure) ([place])   . Chronic diastolic heart failure ([place])   . Colon cancer ([place]) 1986  . Colostomy in place [facility])   . Coronary artery disease    Previously decreased EF; echo 113 normal LV function  . ESRD (end stage renal disease) on dialysis [facility])    Dr Dunham/Dr. Lyda Kalata.  M, W, Fr; [place] (11/17/2015)  . GERD (gastroesophageal reflux disease)   . Glaucoma   . Heart murmur   . Hernia, incisional    abd  . Hyperlipidemia   . Hypertension   . Hypertensive heart disease    sees Dr. Alain Marion  . LBBB (left bundle branch block)   . Mitral valve insufficiency and aortic valve insufficiency   . Obesity   . Stroke [facility])    2011/12  . Type II diabetes mellitus ([place])     Past Surgical History:  Procedure Laterality Date  . ARTERIOVENOUS GRAFT PLACEMENT Left 2010   "forearm"  . CARDIAC CATHETERIZATION    . CARDIAC CATHETERIZATION N/A 10/27/2014   Procedure: Left Heart Cath and Coronary Angiography;  Surgeon: Leonie Man, MD;  Location: [place] CV LAB;  Service: Cardiovascular;  Laterality: N/A;  . CARDIAC CATHETERIZATION N/A 10/28/2014   Procedure: Coronary Stent Intervention;  Surgeon: Peter M Martinique, MD;  Location: [place] CV  LAB;  Service: Cardiovascular;  Laterality: N/A;  . CARDIAC CATHETERIZATION Bilateral 12/2008   R. heart cath showed elevated left and right heart filling pressures w/ pulmonary artery pressure elevated mildly out of proportion to the wedge. The left heart cath showed diffuse distal vessel disease as well as a 75% stenosis in the mid circumflex w/ a 90% stenosis of the ostial first obtuse marginal. These lesions were in close proximity. there was a 60-70%mild RCA stenosis.   [name] CATARACT EXTRACTION W/ INTRAOCULAR LENS  IMPLANT, BILATERAL Bilateral   . COLON SURGERY    . COLONOSCOPY    . COLOSTOMY  1986  . ELECTROCARDIOGRAM  04-27-06  . ELECTROPHYSIOLOGIC STUDY N/A 11/17/2015   Procedure: A-Tach Ablation;  Surgeon: Will Meredith Leeds, MD;  Location: [place] CV LAB;  Service: Cardiovascular;  Laterality: N/A;  . ESOPHAGOGASTRODUODENOSCOPY  03-18-04  . ESOPHAGOGASTRODUODENOSCOPY N/A 02/02/2013   Procedure: ESOPHAGOGASTRODUODENOSCOPY (EGD);  Surgeon: Irene Shipper, MD;  Location[facility] END[facility]COPY;  Service: Endoscopy;  Laterality: N/A;  . EYE SURGERY    . FOOT AMPUTATION THROUGH METATARSAL  10-07-10   Right foot transmetatarsal  . INSERTION OF AHMED VALVE Right 08/20/2013   Procedure: INSERTION OF AHMED VALVE WITH Mitomycin C application;  Surgeon: Marylynn Pearson, MD;  Location: [place];  Service: Ophthalmology;  Laterality: Right;  . INSERTION OF AHMED VALVE Left 07/22/2014   Procedure: INSERTION OF AHMED VALVE WITH  [facility] MITOMYCIN;  Surgeon: Marylynn Pearson, MD;  Location: [place];  Service: Ophthalmology;  Laterality: Left;  . MITOMYCIN C APPLICATION Left 9/82/6415   Procedure: MITOMYCIN C APPLICATION;  Surgeon: Marylynn Pearson, MD;  Location: [place];  Service: Ophthalmology;  Laterality: Left;  . PARS PLANA VITRECTOMY  11/29/2011   Procedure: PARS PLANA VITRECTOMY WITH 23 GAUGE;  Surgeon: Adonis Brook, MD;  Location: [place];  Service: Ophthalmology;  Laterality: Right;  Right Eye 23 ga vitrectomy with membrane peel    . PARS PLANA VITRECTOMY Left 02/28/2012   Procedure: PARS PLANA VITRECTOMY WITH 23 GAUGE;  Surgeon: Adonis Brook, MD;  Location: [place];  Service: Ophthalmology;  Laterality: Left;  . REVISION UROSTOMY CUTANEOUS    . SUPRAVENTRICULAR TACHYCARDIA ABLATION  11/17/2015  . VAGINAL HYSTERECTOMY      Social History   Social History  . Marital status: Widowed    Spouse name: N/A  . Number of children: N/A  . Years of education: N/A   Occupational History  . Not on file.   Social History Main Topics  . Smoking status: Never Smoker  . Smokeless tobacco: Never Used  . Alcohol use No  . Drug use: No  . Sexual activity: No   Other Topics Concern  . Not on file   Social History Narrative  . No narrative on file    Current Outpatient Prescriptions on File Prior to Visit  Medication Sig Dispense Refill  . acetaminophen (TYLENOL) 500 MG tablet Take 500 mg by mouth every 6 (six) hours as needed (pain).    [name] amiodarone (PACERONE) 200 MG tablet Take 1 tablet (200 mg total) by mouth daily. 90 tablet 3  . aspirin 81 MG tablet Take 1 tablet (81 mg total) by mouth daily. 30 tablet 3  . BRILINTA 90 MG TABS tablet TAKE 1 TABLET BY MOUTH TWICE DAILY 60 tablet 9  . calcium acetate (PHOSLO) 667 MG capsule Take 1,334-2,001 mg by mouth See admin instructions. Take 2001mg  three times daily with meals and Take 1334mg   with snacks.    . dorzolamide-timolol (COSOPT) 22.3-6.8 MG/ML ophthalmic solution Place 1 drop into the left eye 2 (two) times daily.  12  . famotidine (PEPCID) 20 MG tablet Take 1 tablet (20 mg total) by mouth 2 (two) times daily. 30 tablet 11  . Fluticasone-Salmeterol (ADVAIR) 250-50 MCG/DOSE AEPB Inhale 1 puff into the lungs daily as needed (shortness of breath).     . metoprolol (LOPRESSOR) 50 MG tablet Take 1 tablet (50 mg total) by mouth 2 (two) times daily. Do not take this medication on dialysis days (Monday, Wednesday, Friday). 60 tablet 11  . nitroGLYCERIN (NITROSTAT) 0.4 MG SL  tablet Place 1 tablet (0.4 mg total) under the tongue every 5 (five) minutes as needed for chest pain. 30 tablet 3  . simvastatin (ZOCOR) 10 MG tablet Take 1 tablet (10 mg total) by mouth every evening. 90 tablet 3   Current Facility-Administered Medications on File Prior to Visit  Medication Dose Route Frequency Provider Last Rate Last Dose  . mitoMYcin (MUTAMYCIN) Injection Use in OR only (0.4 mg/ml)  0.5 mL Left Eye Once Marylynn Pearson, MD        Allergies  Allergen Reactions  . Ace Inhibitors Cough  . Eggs Or Egg-Derived Products Nausea And Vomiting  . Enalapril Cough  . Lisinopril Cough  . Omnipaque [Iohexol] Hives    Family History  Problem Relation Age of Onset  . Hypertension Mother   . Coronary  artery disease Mother   . Hypertension Father   . Diabetes Father   . Cancer Sister     colon  . Hypertension Other     BP 124/80   Pulse 88   Ht 5' (1.524 m)   Wt 181 lb (82.1 kg)   BMI 35.35 kg/m    Review of Systems She denies hypoglycemia.     Objective:   Physical Exam VITAL SIGNS:  See vs page GENERAL: no distress.  CV: no leg edema. Pulses: dorsalis pedis absent bilat.  Feet: no edema. There is a healed surgical scar (transmetatarsal amputation) of the right foot. There is onychomycosis of the left toenails.  Skin: no ulcer on the feet. feet are of normal color and temp.   Neuro: sensation is intact to touch on the feet, but severely decreased from normal.   Lab Results  Component Value Date   CREATININE 4.15 (H) 01/18/2016   BUN 27 (H) 01/18/2016   NA 143 01/18/2016   K 4.1 01/18/2016   CL 98 01/18/2016   CO2 32 01/18/2016   a1c=6.6%    Assessment & Plan:  Type 2 DM, with SEG:[id number] well-controlled Renal failure: we'll check fructosamine, in case a1c is overestimating glycemic control.   CHF: in this context, she needs to avoid hypoglycemia  Patient is advised the following: Patient Instructions  Please continue the same medication.  A  diabetes blood test is requested for you today.  We'll let you know about the results. Please come back for a follow-up appointment in 6 months.   check your blood sugar once a day.  vary the time of day when you check, between before the 3 meals, and at bedtime.  also check if you have symptoms of your blood sugar being too high or too low.  please keep a record of the readings and bring it to your next appointment here.  please call us sooner if you are having low blood sugar episodes, or if it stays over 200.

## 2016-03-16 NOTE — Patient Instructions (Addendum)
Please continue the same medication.  A diabetes blood test is requested for you today.  We'll let you know about the results. Please come back for a follow-up appointment in 6 months.   check your blood sugar once a day.  vary the time of day when you check, between before the 3 meals, and at bedtime.  also check if you have symptoms of your blood sugar being too high or too low.  please keep a record of the readings and bring it to your next appointment here.  please call us sooner if you are having low blood sugar episodes, or if it stays over 200.

## 2016-03-17 DIAGNOSIS — D509 Iron deficiency anemia, unspecified: Secondary | ICD-10-CM | POA: Diagnosis not present

## 2016-03-17 DIAGNOSIS — E1129 Type 2 diabetes mellitus with other diabetic kidney complication: Secondary | ICD-10-CM | POA: Diagnosis not present

## 2016-03-17 DIAGNOSIS — D631 Anemia in chronic kidney disease: Secondary | ICD-10-CM | POA: Diagnosis not present

## 2016-03-17 DIAGNOSIS — N186 End stage renal disease: Secondary | ICD-10-CM | POA: Diagnosis not present

## 2016-03-17 DIAGNOSIS — E119 Type 2 diabetes mellitus without complications: Secondary | ICD-10-CM | POA: Diagnosis not present

## 2016-03-17 DIAGNOSIS — N2581 Secondary hyperparathyroidism of renal origin: Secondary | ICD-10-CM | POA: Diagnosis not present

## 2016-03-20 DIAGNOSIS — N2581 Secondary hyperparathyroidism of renal origin: Secondary | ICD-10-CM | POA: Diagnosis not present

## 2016-03-20 DIAGNOSIS — D631 Anemia in chronic kidney disease: Secondary | ICD-10-CM | POA: Diagnosis not present

## 2016-03-20 DIAGNOSIS — E119 Type 2 diabetes mellitus without complications: Secondary | ICD-10-CM | POA: Diagnosis not present

## 2016-03-20 DIAGNOSIS — D509 Iron deficiency anemia, unspecified: Secondary | ICD-10-CM | POA: Diagnosis not present

## 2016-03-20 DIAGNOSIS — E1129 Type 2 diabetes mellitus with other diabetic kidney complication: Secondary | ICD-10-CM | POA: Diagnosis not present

## 2016-03-20 DIAGNOSIS — N186 End stage renal disease: Secondary | ICD-10-CM | POA: Diagnosis not present

## 2016-03-20 LAB — FRUCTOSAMINE: Fructosamine: 370 umol/L — ABNORMAL HIGH (ref 190–270)

## 2016-03-22 DIAGNOSIS — N186 End stage renal disease: Secondary | ICD-10-CM | POA: Diagnosis not present

## 2016-03-22 DIAGNOSIS — D631 Anemia in chronic kidney disease: Secondary | ICD-10-CM | POA: Diagnosis not present

## 2016-03-22 DIAGNOSIS — E119 Type 2 diabetes mellitus without complications: Secondary | ICD-10-CM | POA: Diagnosis not present

## 2016-03-22 DIAGNOSIS — E1129 Type 2 diabetes mellitus with other diabetic kidney complication: Secondary | ICD-10-CM | POA: Diagnosis not present

## 2016-03-22 DIAGNOSIS — N2581 Secondary hyperparathyroidism of renal origin: Secondary | ICD-10-CM | POA: Diagnosis not present

## 2016-03-22 DIAGNOSIS — D509 Iron deficiency anemia, unspecified: Secondary | ICD-10-CM | POA: Diagnosis not present

## 2016-03-24 DIAGNOSIS — N186 End stage renal disease: Secondary | ICD-10-CM | POA: Diagnosis not present

## 2016-03-24 DIAGNOSIS — E1129 Type 2 diabetes mellitus with other diabetic kidney complication: Secondary | ICD-10-CM | POA: Diagnosis not present

## 2016-03-24 DIAGNOSIS — N2581 Secondary hyperparathyroidism of renal origin: Secondary | ICD-10-CM | POA: Diagnosis not present

## 2016-03-24 DIAGNOSIS — D631 Anemia in chronic kidney disease: Secondary | ICD-10-CM | POA: Diagnosis not present

## 2016-03-24 DIAGNOSIS — E119 Type 2 diabetes mellitus without complications: Secondary | ICD-10-CM | POA: Diagnosis not present

## 2016-03-24 DIAGNOSIS — D509 Iron deficiency anemia, unspecified: Secondary | ICD-10-CM | POA: Diagnosis not present

## 2016-03-27 DIAGNOSIS — E119 Type 2 diabetes mellitus without complications: Secondary | ICD-10-CM | POA: Diagnosis not present

## 2016-03-27 DIAGNOSIS — N186 End stage renal disease: Secondary | ICD-10-CM | POA: Diagnosis not present

## 2016-03-27 DIAGNOSIS — D509 Iron deficiency anemia, unspecified: Secondary | ICD-10-CM | POA: Diagnosis not present

## 2016-03-27 DIAGNOSIS — E1129 Type 2 diabetes mellitus with other diabetic kidney complication: Secondary | ICD-10-CM | POA: Diagnosis not present

## 2016-03-27 DIAGNOSIS — D631 Anemia in chronic kidney disease: Secondary | ICD-10-CM | POA: Diagnosis not present

## 2016-03-27 DIAGNOSIS — N2581 Secondary hyperparathyroidism of renal origin: Secondary | ICD-10-CM | POA: Diagnosis not present

## 2016-03-29 DIAGNOSIS — E119 Type 2 diabetes mellitus without complications: Secondary | ICD-10-CM | POA: Diagnosis not present

## 2016-03-29 DIAGNOSIS — E1129 Type 2 diabetes mellitus with other diabetic kidney complication: Secondary | ICD-10-CM | POA: Diagnosis not present

## 2016-03-29 DIAGNOSIS — N186 End stage renal disease: Secondary | ICD-10-CM | POA: Diagnosis not present

## 2016-03-29 DIAGNOSIS — N2581 Secondary hyperparathyroidism of renal origin: Secondary | ICD-10-CM | POA: Diagnosis not present

## 2016-03-29 DIAGNOSIS — D631 Anemia in chronic kidney disease: Secondary | ICD-10-CM | POA: Diagnosis not present

## 2016-03-29 DIAGNOSIS — D509 Iron deficiency anemia, unspecified: Secondary | ICD-10-CM | POA: Diagnosis not present

## 2016-03-31 DIAGNOSIS — N186 End stage renal disease: Secondary | ICD-10-CM | POA: Diagnosis not present

## 2016-03-31 DIAGNOSIS — E119 Type 2 diabetes mellitus without complications: Secondary | ICD-10-CM | POA: Diagnosis not present

## 2016-03-31 DIAGNOSIS — E1129 Type 2 diabetes mellitus with other diabetic kidney complication: Secondary | ICD-10-CM | POA: Diagnosis not present

## 2016-03-31 DIAGNOSIS — N2581 Secondary hyperparathyroidism of renal origin: Secondary | ICD-10-CM | POA: Diagnosis not present

## 2016-03-31 DIAGNOSIS — D509 Iron deficiency anemia, unspecified: Secondary | ICD-10-CM | POA: Diagnosis not present

## 2016-03-31 DIAGNOSIS — D631 Anemia in chronic kidney disease: Secondary | ICD-10-CM | POA: Diagnosis not present

## 2016-04-03 DIAGNOSIS — D631 Anemia in chronic kidney disease: Secondary | ICD-10-CM | POA: Diagnosis not present

## 2016-04-03 DIAGNOSIS — E1129 Type 2 diabetes mellitus with other diabetic kidney complication: Secondary | ICD-10-CM | POA: Diagnosis not present

## 2016-04-03 DIAGNOSIS — N186 End stage renal disease: Secondary | ICD-10-CM | POA: Diagnosis not present

## 2016-04-03 DIAGNOSIS — N2581 Secondary hyperparathyroidism of renal origin: Secondary | ICD-10-CM | POA: Diagnosis not present

## 2016-04-03 DIAGNOSIS — E119 Type 2 diabetes mellitus without complications: Secondary | ICD-10-CM | POA: Diagnosis not present

## 2016-04-03 DIAGNOSIS — D509 Iron deficiency anemia, unspecified: Secondary | ICD-10-CM | POA: Diagnosis not present

## 2016-04-04 ENCOUNTER — Ambulatory Visit: Payer: Medicare Other | Admitting: Internal Medicine

## 2016-04-04 DIAGNOSIS — Z0289 Encounter for other administrative examinations: Secondary | ICD-10-CM

## 2016-04-05 DIAGNOSIS — N2581 Secondary hyperparathyroidism of renal origin: Secondary | ICD-10-CM | POA: Diagnosis not present

## 2016-04-05 DIAGNOSIS — N186 End stage renal disease: Secondary | ICD-10-CM | POA: Diagnosis not present

## 2016-04-05 DIAGNOSIS — D509 Iron deficiency anemia, unspecified: Secondary | ICD-10-CM | POA: Diagnosis not present

## 2016-04-05 DIAGNOSIS — E119 Type 2 diabetes mellitus without complications: Secondary | ICD-10-CM | POA: Diagnosis not present

## 2016-04-05 DIAGNOSIS — D631 Anemia in chronic kidney disease: Secondary | ICD-10-CM | POA: Diagnosis not present

## 2016-04-05 DIAGNOSIS — E1129 Type 2 diabetes mellitus with other diabetic kidney complication: Secondary | ICD-10-CM | POA: Diagnosis not present

## 2016-04-07 DIAGNOSIS — D509 Iron deficiency anemia, unspecified: Secondary | ICD-10-CM | POA: Diagnosis not present

## 2016-04-07 DIAGNOSIS — D631 Anemia in chronic kidney disease: Secondary | ICD-10-CM | POA: Diagnosis not present

## 2016-04-07 DIAGNOSIS — N2581 Secondary hyperparathyroidism of renal origin: Secondary | ICD-10-CM | POA: Diagnosis not present

## 2016-04-07 DIAGNOSIS — E1129 Type 2 diabetes mellitus with other diabetic kidney complication: Secondary | ICD-10-CM | POA: Diagnosis not present

## 2016-04-07 DIAGNOSIS — E119 Type 2 diabetes mellitus without complications: Secondary | ICD-10-CM | POA: Diagnosis not present

## 2016-04-07 DIAGNOSIS — N186 End stage renal disease: Secondary | ICD-10-CM | POA: Diagnosis not present

## 2016-04-08 DIAGNOSIS — N186 End stage renal disease: Secondary | ICD-10-CM | POA: Diagnosis not present

## 2016-04-08 DIAGNOSIS — Z992 Dependence on renal dialysis: Secondary | ICD-10-CM | POA: Diagnosis not present

## 2016-04-08 DIAGNOSIS — E1129 Type 2 diabetes mellitus with other diabetic kidney complication: Secondary | ICD-10-CM | POA: Diagnosis not present

## 2016-04-10 DIAGNOSIS — D631 Anemia in chronic kidney disease: Secondary | ICD-10-CM | POA: Diagnosis not present

## 2016-04-10 DIAGNOSIS — N2581 Secondary hyperparathyroidism of renal origin: Secondary | ICD-10-CM | POA: Diagnosis not present

## 2016-04-10 DIAGNOSIS — D509 Iron deficiency anemia, unspecified: Secondary | ICD-10-CM | POA: Diagnosis not present

## 2016-04-10 DIAGNOSIS — N186 End stage renal disease: Secondary | ICD-10-CM | POA: Diagnosis not present

## 2016-04-10 DIAGNOSIS — E119 Type 2 diabetes mellitus without complications: Secondary | ICD-10-CM | POA: Diagnosis not present

## 2016-04-12 DIAGNOSIS — D509 Iron deficiency anemia, unspecified: Secondary | ICD-10-CM | POA: Diagnosis not present

## 2016-04-12 DIAGNOSIS — N2581 Secondary hyperparathyroidism of renal origin: Secondary | ICD-10-CM | POA: Diagnosis not present

## 2016-04-12 DIAGNOSIS — D631 Anemia in chronic kidney disease: Secondary | ICD-10-CM | POA: Diagnosis not present

## 2016-04-12 DIAGNOSIS — N186 End stage renal disease: Secondary | ICD-10-CM | POA: Diagnosis not present

## 2016-04-12 DIAGNOSIS — E119 Type 2 diabetes mellitus without complications: Secondary | ICD-10-CM | POA: Diagnosis not present

## 2016-04-14 DIAGNOSIS — D509 Iron deficiency anemia, unspecified: Secondary | ICD-10-CM | POA: Diagnosis not present

## 2016-04-14 DIAGNOSIS — N186 End stage renal disease: Secondary | ICD-10-CM | POA: Diagnosis not present

## 2016-04-14 DIAGNOSIS — E119 Type 2 diabetes mellitus without complications: Secondary | ICD-10-CM | POA: Diagnosis not present

## 2016-04-14 DIAGNOSIS — D631 Anemia in chronic kidney disease: Secondary | ICD-10-CM | POA: Diagnosis not present

## 2016-04-14 DIAGNOSIS — N2581 Secondary hyperparathyroidism of renal origin: Secondary | ICD-10-CM | POA: Diagnosis not present

## 2016-04-17 DIAGNOSIS — D631 Anemia in chronic kidney disease: Secondary | ICD-10-CM | POA: Diagnosis not present

## 2016-04-17 DIAGNOSIS — N186 End stage renal disease: Secondary | ICD-10-CM | POA: Diagnosis not present

## 2016-04-17 DIAGNOSIS — E119 Type 2 diabetes mellitus without complications: Secondary | ICD-10-CM | POA: Diagnosis not present

## 2016-04-17 DIAGNOSIS — N2581 Secondary hyperparathyroidism of renal origin: Secondary | ICD-10-CM | POA: Diagnosis not present

## 2016-04-17 DIAGNOSIS — D509 Iron deficiency anemia, unspecified: Secondary | ICD-10-CM | POA: Diagnosis not present

## 2016-04-19 DIAGNOSIS — E119 Type 2 diabetes mellitus without complications: Secondary | ICD-10-CM | POA: Diagnosis not present

## 2016-04-19 DIAGNOSIS — N186 End stage renal disease: Secondary | ICD-10-CM | POA: Diagnosis not present

## 2016-04-19 DIAGNOSIS — D631 Anemia in chronic kidney disease: Secondary | ICD-10-CM | POA: Diagnosis not present

## 2016-04-19 DIAGNOSIS — N2581 Secondary hyperparathyroidism of renal origin: Secondary | ICD-10-CM | POA: Diagnosis not present

## 2016-04-19 DIAGNOSIS — D509 Iron deficiency anemia, unspecified: Secondary | ICD-10-CM | POA: Diagnosis not present

## 2016-04-21 DIAGNOSIS — D509 Iron deficiency anemia, unspecified: Secondary | ICD-10-CM | POA: Diagnosis not present

## 2016-04-21 DIAGNOSIS — D631 Anemia in chronic kidney disease: Secondary | ICD-10-CM | POA: Diagnosis not present

## 2016-04-21 DIAGNOSIS — E119 Type 2 diabetes mellitus without complications: Secondary | ICD-10-CM | POA: Diagnosis not present

## 2016-04-21 DIAGNOSIS — N186 End stage renal disease: Secondary | ICD-10-CM | POA: Diagnosis not present

## 2016-04-21 DIAGNOSIS — N2581 Secondary hyperparathyroidism of renal origin: Secondary | ICD-10-CM | POA: Diagnosis not present

## 2016-04-24 DIAGNOSIS — N2581 Secondary hyperparathyroidism of renal origin: Secondary | ICD-10-CM | POA: Diagnosis not present

## 2016-04-24 DIAGNOSIS — E119 Type 2 diabetes mellitus without complications: Secondary | ICD-10-CM | POA: Diagnosis not present

## 2016-04-24 DIAGNOSIS — N186 End stage renal disease: Secondary | ICD-10-CM | POA: Diagnosis not present

## 2016-04-24 DIAGNOSIS — D509 Iron deficiency anemia, unspecified: Secondary | ICD-10-CM | POA: Diagnosis not present

## 2016-04-24 DIAGNOSIS — D631 Anemia in chronic kidney disease: Secondary | ICD-10-CM | POA: Diagnosis not present

## 2016-04-25 ENCOUNTER — Ambulatory Visit (INDEPENDENT_AMBULATORY_CARE_PROVIDER_SITE_OTHER): Payer: Medicare Other | Admitting: Internal Medicine

## 2016-04-25 ENCOUNTER — Encounter: Payer: Self-pay | Admitting: Internal Medicine

## 2016-04-25 DIAGNOSIS — K219 Gastro-esophageal reflux disease without esophagitis: Secondary | ICD-10-CM

## 2016-04-25 DIAGNOSIS — N186 End stage renal disease: Secondary | ICD-10-CM | POA: Diagnosis not present

## 2016-04-25 DIAGNOSIS — H9190 Unspecified hearing loss, unspecified ear: Secondary | ICD-10-CM | POA: Insufficient documentation

## 2016-04-25 DIAGNOSIS — Z992 Dependence on renal dialysis: Secondary | ICD-10-CM

## 2016-04-25 DIAGNOSIS — H9193 Unspecified hearing loss, bilateral: Secondary | ICD-10-CM

## 2016-04-25 DIAGNOSIS — E1122 Type 2 diabetes mellitus with diabetic chronic kidney disease: Secondary | ICD-10-CM | POA: Diagnosis not present

## 2016-04-25 DIAGNOSIS — I2510 Atherosclerotic heart disease of native coronary artery without angina pectoris: Secondary | ICD-10-CM | POA: Diagnosis not present

## 2016-04-25 DIAGNOSIS — I255 Ischemic cardiomyopathy: Secondary | ICD-10-CM | POA: Diagnosis not present

## 2016-04-25 MED ORDER — FAMOTIDINE 20 MG PO TABS: 20.0000 mg | ORAL_TABLET | Freq: Two times a day (BID) | ORAL | 11 refills | Status: DC

## 2016-04-25 NOTE — Patient Instructions (Signed)
MC well w/Jill 

## 2016-04-25 NOTE — Progress Notes (Signed)
Pre visit review using our clinic review tool, if applicable. No additional management support is needed unless otherwise documented below in the visit note. 

## 2016-04-25 NOTE — Assessment & Plan Note (Signed)
-- 

## 2016-04-25 NOTE — Assessment & Plan Note (Signed)
No CP 

## 2016-04-25 NOTE — Assessment & Plan Note (Signed)
On Repaglinide

## 2016-04-25 NOTE — Assessment & Plan Note (Signed)
Worse Re-start Pepcid

## 2016-04-25 NOTE — Progress Notes (Signed)
Subjective:  Patient ID: [name], female    DOB: [birth year]  Age: 71 y.o. MRN: [id number]  CC: No chief complaint on file.   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 acetaminophen (TYLENOL) 500 MG tablet Take 500 mg by mouth every 6 (six) hours as needed (pain).    [name] amiodarone (PACERONE) 200 MG tablet Take 1 tablet (200 mg total) by mouth daily. 90 tablet 3  . aspirin 81 MG tablet Take 1 tablet (81 mg total) by mouth daily. 30 tablet 3  . BRILINTA 90 MG TABS tablet TAKE 1 TABLET BY MOUTH TWICE DAILY 60 tablet 9  . calcium acetate (PHOSLO) 667 MG capsule Take 1,334-2,001 mg by mouth See admin instructions. Take 2001mg  three times daily with meals and Take 1334mg   with snacks.    . dorzolamide-timolol (COSOPT) 22.3-6.8 MG/ML ophthalmic solution Place 1 drop into the left eye 2 (two) times daily.  12  . famotidine (PEPCID) 20 MG tablet Take 1 tablet (20 mg total) by mouth 2 (two) times daily. 30 tablet 11  . Fluticasone-Salmeterol (ADVAIR) 250-50 MCG/DOSE AEPB Inhale 1 puff into the lungs daily as needed (shortness of breath).     [name] glucose blood (ONE TOUCH ULTRA TEST) test strip 1 each by Other route daily. And lancets 1/day 100 each 3  . metoprolol (LOPRESSOR) 50 MG tablet Take 1 tablet (50 mg total) by mouth 2 (two) times daily. Do not take this medication on dialysis days (Monday, Wednesday, Friday). 60 tablet 11  . nitroGLYCERIN (NITROSTAT) 0.4 MG SL tablet Place 1 tablet (0.4 mg total) under the tongue every 5 (five) minutes as needed for chest pain. 30 tablet 3  . repaglinide (PRANDIN) 0.5 MG tablet TAKE 1 TABLET BY MOUTH THREE TIMES DAILY BEFORE MEALS 90 tablet 3  . simvastatin (ZOCOR) 10 MG tablet Take 1 tablet (10 mg total) by mouth every evening. 90 tablet 3   Facility-Administered Medications Prior to Visit  Medication Dose Route Frequency Provider Last  Rate Last Dose  . mitoMYcin (MUTAMYCIN) Injection Use in OR only (0.4 mg/ml)  0.5 mL Left Eye Once Marylynn Pearson, MD        ROS Review of Systems  Constitutional: Positive for fatigue. Negative for activity change, appetite change, chills and unexpected weight change.  HENT: Positive for hearing loss. Negative for congestion, mouth sores and sinus pressure.   Eyes: Negative for visual disturbance.  Respiratory: Negative for cough and chest tightness.   Gastrointestinal: Negative for abdominal pain and nausea.  Genitourinary: Negative for difficulty urinating, frequency and vaginal pain.  Musculoskeletal: Positive for back pain and gait problem.  Skin: Negative for pallor and rash.  Neurological: Negative for dizziness, tremors, weakness, numbness and headaches.  Psychiatric/Behavioral: Negative for confusion and sleep disturbance.    Objective:  BP 126/88 (BP Location: Right Arm, Patient Position: Sitting, Cuff Size: Normal)   Pulse (!) 106   Temp 98.3 F (36.8 C) (Oral)   Ht 5' (1.524 m)   Wt 181 lb 1.9 oz (82.2 kg)   SpO2 95%   BMI 35.37 kg/m   BP Readings from Last 3 Encounters:  04/25/16 126/88  03/16/16 124/80  02/08/16 106/62    Wt Readings from Last 3 Encounters:  04/25/16 181 lb 1.9 oz (82.2 kg)  03/16/16 181 lb (82.1 kg)  02/08/16 186 lb 6.4 oz (84.6 kg)  Physical Exam  Constitutional: She appears well-developed. No distress.  HENT:  Head: Normocephalic.  Right Ear: External ear normal.  Left Ear: External ear normal.  Nose: Nose normal.  Mouth/Throat: Oropharynx is clear and moist.  Eyes: Conjunctivae are normal. Pupils are equal, round, and reactive to light. Right eye exhibits no discharge. Left eye exhibits no discharge.  Neck: Normal range of motion. Neck supple. No JVD present. No tracheal deviation present. No thyromegaly present.  Cardiovascular: Normal rate, regular rhythm and normal heart sounds.   Pulmonary/Chest: No stridor. No respiratory  distress. She has no wheezes.  Abdominal: Soft. Bowel sounds are normal. She exhibits no distension and no mass. There is no tenderness. There is no rebound and no guarding.  Musculoskeletal: She exhibits no edema or tenderness.  Lymphadenopathy:    She has no cervical adenopathy.  Neurological: She displays normal reflexes. No cranial nerve deficit. She exhibits normal muscle tone. Coordination abnormal.  Skin: No rash noted. No erythema.  Psychiatric: She has a normal mood and affect. Her behavior is normal. Judgment and thought content normal.  Wax L ear - removed Walker Obese  Lab Results  Component Value Date   WBC 4.7 01/18/2016   HGB 11.5 (L) 01/18/2016   HCT 35.7 (L) 01/18/2016   PLT 183.0 01/18/2016   GLUCOSE 120 (H) 01/18/2016   CHOL 166 10/25/2014   TRIG 205 (H) 10/25/2014   HDL 27 (L) 10/25/2014   LDLCALC 98 10/25/2014   ALT 7 01/18/2016   AST 10 01/18/2016   NA 143 01/18/2016   K 4.1 01/18/2016   CL 98 01/18/2016   CREATININE 4.15 (H) 01/18/2016   BUN 27 (H) 01/18/2016   CO2 32 01/18/2016   TSH 1.44 01/18/2016   INR 1.19 10/24/2014   HGBA1C 6.6 03/16/2016    No results found.  Assessment & Plan:   There are no diagnoses linked to this encounter. I am having Ms. Olvey maintain her calcium acetate, Fluticasone-Salmeterol, acetaminophen, aspirin, nitroGLYCERIN, BRILINTA, simvastatin, amiodarone, dorzolamide-timolol, metoprolol, famotidine, repaglinide, and glucose blood.  No orders of the defined types were placed in this encounter.    Follow-up: No Follow-up on file.  Walker Kehr, MD

## 2016-04-25 NOTE — Assessment & Plan Note (Signed)
R ear wax removed Audiol ref offered

## 2016-04-26 DIAGNOSIS — D631 Anemia in chronic kidney disease: Secondary | ICD-10-CM | POA: Diagnosis not present

## 2016-04-26 DIAGNOSIS — N2581 Secondary hyperparathyroidism of renal origin: Secondary | ICD-10-CM | POA: Diagnosis not present

## 2016-04-26 DIAGNOSIS — N186 End stage renal disease: Secondary | ICD-10-CM | POA: Diagnosis not present

## 2016-04-26 DIAGNOSIS — D509 Iron deficiency anemia, unspecified: Secondary | ICD-10-CM | POA: Diagnosis not present

## 2016-04-26 DIAGNOSIS — E119 Type 2 diabetes mellitus without complications: Secondary | ICD-10-CM | POA: Diagnosis not present

## 2016-04-28 DIAGNOSIS — E119 Type 2 diabetes mellitus without complications: Secondary | ICD-10-CM | POA: Diagnosis not present

## 2016-04-28 DIAGNOSIS — N2581 Secondary hyperparathyroidism of renal origin: Secondary | ICD-10-CM | POA: Diagnosis not present

## 2016-04-28 DIAGNOSIS — D509 Iron deficiency anemia, unspecified: Secondary | ICD-10-CM | POA: Diagnosis not present

## 2016-04-28 DIAGNOSIS — D631 Anemia in chronic kidney disease: Secondary | ICD-10-CM | POA: Diagnosis not present

## 2016-04-28 DIAGNOSIS — N186 End stage renal disease: Secondary | ICD-10-CM | POA: Diagnosis not present

## 2016-05-01 DIAGNOSIS — D631 Anemia in chronic kidney disease: Secondary | ICD-10-CM | POA: Diagnosis not present

## 2016-05-01 DIAGNOSIS — D509 Iron deficiency anemia, unspecified: Secondary | ICD-10-CM | POA: Diagnosis not present

## 2016-05-01 DIAGNOSIS — N186 End stage renal disease: Secondary | ICD-10-CM | POA: Diagnosis not present

## 2016-05-01 DIAGNOSIS — E119 Type 2 diabetes mellitus without complications: Secondary | ICD-10-CM | POA: Diagnosis not present

## 2016-05-01 DIAGNOSIS — N2581 Secondary hyperparathyroidism of renal origin: Secondary | ICD-10-CM | POA: Diagnosis not present

## 2016-05-03 ENCOUNTER — Telehealth: Payer: Self-pay | Admitting: Internal Medicine

## 2016-05-03 DIAGNOSIS — Z933 Colostomy status: Secondary | ICD-10-CM

## 2016-05-03 DIAGNOSIS — E119 Type 2 diabetes mellitus without complications: Secondary | ICD-10-CM | POA: Diagnosis not present

## 2016-05-03 DIAGNOSIS — N2581 Secondary hyperparathyroidism of renal origin: Secondary | ICD-10-CM | POA: Diagnosis not present

## 2016-05-03 DIAGNOSIS — D509 Iron deficiency anemia, unspecified: Secondary | ICD-10-CM | POA: Diagnosis not present

## 2016-05-03 DIAGNOSIS — N186 End stage renal disease: Secondary | ICD-10-CM | POA: Diagnosis not present

## 2016-05-03 DIAGNOSIS — E1129 Type 2 diabetes mellitus with other diabetic kidney complication: Secondary | ICD-10-CM | POA: Diagnosis not present

## 2016-05-03 DIAGNOSIS — D631 Anemia in chronic kidney disease: Secondary | ICD-10-CM | POA: Diagnosis not present

## 2016-05-03 NOTE — Telephone Encounter (Signed)
OK. Thx

## 2016-05-03 NOTE — Telephone Encounter (Signed)
Pt daughter called she would like to get home health aid for 3x per week in morning to help with colostomy bag.

## 2016-05-03 NOTE — Telephone Encounter (Signed)
referral done.

## 2016-05-03 NOTE — Telephone Encounter (Signed)
Okay to do? Please advise

## 2016-05-05 DIAGNOSIS — D631 Anemia in chronic kidney disease: Secondary | ICD-10-CM | POA: Diagnosis not present

## 2016-05-05 DIAGNOSIS — E119 Type 2 diabetes mellitus without complications: Secondary | ICD-10-CM | POA: Diagnosis not present

## 2016-05-05 DIAGNOSIS — D509 Iron deficiency anemia, unspecified: Secondary | ICD-10-CM | POA: Diagnosis not present

## 2016-05-05 DIAGNOSIS — N186 End stage renal disease: Secondary | ICD-10-CM | POA: Diagnosis not present

## 2016-05-05 DIAGNOSIS — N2581 Secondary hyperparathyroidism of renal origin: Secondary | ICD-10-CM | POA: Diagnosis not present

## 2016-05-08 ENCOUNTER — Ambulatory Visit: Payer: Medicare Other | Admitting: Cardiology

## 2016-05-08 DIAGNOSIS — D509 Iron deficiency anemia, unspecified: Secondary | ICD-10-CM | POA: Diagnosis not present

## 2016-05-08 DIAGNOSIS — N186 End stage renal disease: Secondary | ICD-10-CM | POA: Diagnosis not present

## 2016-05-08 DIAGNOSIS — Z992 Dependence on renal dialysis: Secondary | ICD-10-CM | POA: Diagnosis not present

## 2016-05-08 DIAGNOSIS — E119 Type 2 diabetes mellitus without complications: Secondary | ICD-10-CM | POA: Diagnosis not present

## 2016-05-08 DIAGNOSIS — N2581 Secondary hyperparathyroidism of renal origin: Secondary | ICD-10-CM | POA: Diagnosis not present

## 2016-05-08 DIAGNOSIS — D631 Anemia in chronic kidney disease: Secondary | ICD-10-CM | POA: Diagnosis not present

## 2016-05-08 DIAGNOSIS — E1129 Type 2 diabetes mellitus with other diabetic kidney complication: Secondary | ICD-10-CM | POA: Diagnosis not present

## 2016-05-09 ENCOUNTER — Ambulatory Visit: Payer: Medicare Other | Admitting: Internal Medicine

## 2016-05-10 ENCOUNTER — Ambulatory Visit: Payer: Medicare Other | Admitting: Cardiology

## 2016-05-10 DIAGNOSIS — N186 End stage renal disease: Secondary | ICD-10-CM | POA: Diagnosis not present

## 2016-05-10 DIAGNOSIS — N2581 Secondary hyperparathyroidism of renal origin: Secondary | ICD-10-CM | POA: Diagnosis not present

## 2016-05-10 DIAGNOSIS — D509 Iron deficiency anemia, unspecified: Secondary | ICD-10-CM | POA: Diagnosis not present

## 2016-05-10 DIAGNOSIS — D631 Anemia in chronic kidney disease: Secondary | ICD-10-CM | POA: Diagnosis not present

## 2016-05-10 DIAGNOSIS — E119 Type 2 diabetes mellitus without complications: Secondary | ICD-10-CM | POA: Diagnosis not present

## 2016-05-12 DIAGNOSIS — N186 End stage renal disease: Secondary | ICD-10-CM | POA: Diagnosis not present

## 2016-05-12 DIAGNOSIS — D631 Anemia in chronic kidney disease: Secondary | ICD-10-CM | POA: Diagnosis not present

## 2016-05-12 DIAGNOSIS — N2581 Secondary hyperparathyroidism of renal origin: Secondary | ICD-10-CM | POA: Diagnosis not present

## 2016-05-12 DIAGNOSIS — D509 Iron deficiency anemia, unspecified: Secondary | ICD-10-CM | POA: Diagnosis not present

## 2016-05-12 DIAGNOSIS — E119 Type 2 diabetes mellitus without complications: Secondary | ICD-10-CM | POA: Diagnosis not present

## 2016-05-15 DIAGNOSIS — E119 Type 2 diabetes mellitus without complications: Secondary | ICD-10-CM | POA: Diagnosis not present

## 2016-05-15 DIAGNOSIS — N186 End stage renal disease: Secondary | ICD-10-CM | POA: Diagnosis not present

## 2016-05-15 DIAGNOSIS — D631 Anemia in chronic kidney disease: Secondary | ICD-10-CM | POA: Diagnosis not present

## 2016-05-15 DIAGNOSIS — N2581 Secondary hyperparathyroidism of renal origin: Secondary | ICD-10-CM | POA: Diagnosis not present

## 2016-05-15 DIAGNOSIS — D509 Iron deficiency anemia, unspecified: Secondary | ICD-10-CM | POA: Diagnosis not present

## 2016-05-16 ENCOUNTER — Emergency Department (HOSPITAL_COMMUNITY)
Admission: EM | Admit: 2016-05-16 | Discharge: 2016-05-16 | Disposition: A | Discharge status: Home / Self Care | Payer: Medicare Other | Attending: Emergency Medicine | Admitting: Emergency Medicine

## 2016-05-16 ENCOUNTER — Emergency Department (HOSPITAL_COMMUNITY): Payer: Medicare Other

## 2016-05-16 ENCOUNTER — Encounter (HOSPITAL_COMMUNITY): Payer: Self-pay

## 2016-05-16 ENCOUNTER — Ambulatory Visit (HOSPITAL_COMMUNITY)
Admission: EM | Admit: 2016-05-16 | Discharge: 2016-05-16 | Disposition: A | Discharge status: Other Acute Inpatient Hospital | Payer: Medicare Other

## 2016-05-16 DIAGNOSIS — I132 Hypertensive heart and chronic kidney disease with heart failure and with stage 5 chronic kidney disease, or end stage renal disease: Secondary | ICD-10-CM | POA: Insufficient documentation

## 2016-05-16 DIAGNOSIS — Z8673 Personal history of transient ischemic attack (TIA), and cerebral infarction without residual deficits: Secondary | ICD-10-CM | POA: Insufficient documentation

## 2016-05-16 DIAGNOSIS — Z992 Dependence on renal dialysis: Secondary | ICD-10-CM | POA: Diagnosis not present

## 2016-05-16 DIAGNOSIS — N186 End stage renal disease: Secondary | ICD-10-CM | POA: Insufficient documentation

## 2016-05-16 DIAGNOSIS — I252 Old myocardial infarction: Secondary | ICD-10-CM | POA: Diagnosis not present

## 2016-05-16 DIAGNOSIS — Z79899 Other long term (current) drug therapy: Secondary | ICD-10-CM | POA: Diagnosis not present

## 2016-05-16 DIAGNOSIS — Z85038 Personal history of other malignant neoplasm of large intestine: Secondary | ICD-10-CM | POA: Diagnosis not present

## 2016-05-16 DIAGNOSIS — R05 Cough: Secondary | ICD-10-CM | POA: Diagnosis not present

## 2016-05-16 DIAGNOSIS — Z7982 Long term (current) use of aspirin: Secondary | ICD-10-CM | POA: Diagnosis not present

## 2016-05-16 DIAGNOSIS — E1122 Type 2 diabetes mellitus with diabetic chronic kidney disease: Secondary | ICD-10-CM | POA: Diagnosis not present

## 2016-05-16 DIAGNOSIS — R059 Cough, unspecified: Secondary | ICD-10-CM

## 2016-05-16 DIAGNOSIS — I5032 Chronic diastolic (congestive) heart failure: Secondary | ICD-10-CM | POA: Insufficient documentation

## 2016-05-16 DIAGNOSIS — I2510 Atherosclerotic heart disease of native coronary artery without angina pectoris: Secondary | ICD-10-CM | POA: Insufficient documentation

## 2016-05-16 DIAGNOSIS — R0602 Shortness of breath: Secondary | ICD-10-CM | POA: Diagnosis not present

## 2016-05-16 LAB — I-STAT TROPONIN, ED: Troponin i, poc: 0.02 ng/mL (ref 0.00–0.08)

## 2016-05-16 LAB — CBC WITH DIFFERENTIAL/PLATELET
BASOS ABS: 0 10*3/uL (ref 0.0–0.1)
Basophils Relative: 0 %
EOS PCT: 1 %
Eosinophils Absolute: 0.1 10*3/uL (ref 0.0–0.7)
HEMATOCRIT: 42 % (ref 36.0–46.0)
Hemoglobin: 13.1 g/dL (ref 12.0–15.0)
LYMPHS PCT: 15 %
Lymphs Abs: 0.9 10*3/uL (ref 0.7–4.0)
MCH: 27.7 pg (ref 26.0–34.0)
MCHC: 31.2 g/dL (ref 30.0–36.0)
MCV: 88.8 fL (ref 78.0–100.0)
Monocytes Absolute: 0.6 10*3/uL (ref 0.1–1.0)
Monocytes Relative: 11 %
NEUTROS ABS: 4.4 10*3/uL (ref 1.7–7.7)
NEUTROS PCT: 73 %
PLATELETS: 290 10*3/uL (ref 150–400)
RBC: 4.73 MIL/uL (ref 3.87–5.11)
RDW: 17 % — ABNORMAL HIGH (ref 11.5–15.5)
WBC: 6 10*3/uL (ref 4.0–10.5)

## 2016-05-16 LAB — BASIC METABOLIC PANEL
ANION GAP: 15 (ref 5–15)
BUN: 24 mg/dL — ABNORMAL HIGH (ref 6–20)
CALCIUM: 8.9 mg/dL (ref 8.9–10.3)
CO2: 30 mmol/L (ref 22–32)
Chloride: 97 mmol/L — ABNORMAL LOW (ref 101–111)
Creatinine, Ser: 4.22 mg/dL — ABNORMAL HIGH (ref 0.44–1.00)
GFR calc Af Amer: 11 mL/min — ABNORMAL LOW (ref >60)
GFR calc non Af Amer: 10 mL/min — ABNORMAL LOW (ref >60)
GLUCOSE: 110 mg/dL — AB (ref 65–99)
POTASSIUM: 4.1 mmol/L (ref 3.5–5.1)
Sodium: 142 mmol/L (ref 135–145)

## 2016-05-16 MED ORDER — ALBUTEROL SULFATE HFA 108 (90 BASE) MCG/ACT IN AERS
2.0000 | INHALATION_SPRAY | RESPIRATORY_TRACT | Status: DC | PRN
  Administered 2016-05-16: 2 via RESPIRATORY_TRACT
  Filled 2016-05-16: qty 6.7

## 2016-05-16 NOTE — Discharge Instructions (Signed)
Can use the inhaler as needed.  Can use an allergy medicine like we discussed if you feel you need one (zyrtec, claritin, allegra, etc.). Follow-up with your primary care doctor. Return to the ED for new or worsening symptoms.

## 2016-05-16 NOTE — ED Provider Notes (Signed)
[facility] DEPT Provider Note   CSN: [id number] Arrival date & time: 05/16/16  1016     History   Chief Complaint Chief Complaint  Patient presents with  . Shortness of Breath    HPI [name] is a 71 y.o. female.  The history is provided by the patient and medical records.  Shortness of Breath  Associated symptoms include cough.    71 year old female with history of anemia, arthritis, cardiomyopathy, congestive heart failure, coronary artery disease, end-stage renal disease on hemodialysis, hyperlipidemia, hypertension, diabetes, presenting to the ED for shortness of breath. Patient reports this is been ongoing for several weeks now. Reports symptoms occur mostly with exertional activity. States she has no shortness of breath at rest. She's not had any chest pain, palpitations, dizziness, weakness. No fever or chills. States she has noticed some coughing recently, reports this is been worse when outside or around freshly mowed lawns. States she's been living in [place] for a while now, but she has had a lot of trouble with her allergies since moving here from [place].  States she does not really want to take allergy medicine. She does have an inhaler at home that she uses when needed, however it expired so she has been unable to use it recently.  She has no hx of DVT or PE.  Patient does receive HD-- M, W, F.  Had full treatment yesterday.  Past Medical History:  Diagnosis Date  . Anemia   . Arthritis    "hands" (11/17/2015)  . Atrial tachycardia ([place])    on amiod  . Cardiomyopathy- mixed   . CHF (congestive heart failure) ([place])   . Chronic diastolic heart failure ([place])   . Colon cancer ([place]) 1986  . Colostomy in place [facility])   . Coronary artery disease    Previously decreased EF; echo 113 normal LV function  . ESRD (end stage renal disease) on dialysis [facility])    Dr Dunham/Dr. Lyda Kalata.  M, W, Fr; [place] (11/17/2015)  . GERD (gastroesophageal reflux disease)     . Glaucoma   . Heart murmur   . Hernia, incisional    abd  . Hyperlipidemia   . Hypertension   . Hypertensive heart disease    sees Dr. Alain Marion  . LBBB (left bundle branch block)   . Mitral valve insufficiency and aortic valve insufficiency   . Obesity   . Stroke [facility])    2011/12  . Type II diabetes mellitus [facility])     Patient Active Problem List   Diagnosis Date Noted  . Hard of hearing 04/25/2016  . GERD (gastroesophageal reflux disease) 02/08/2016  . Loss of weight 01/18/2016  . Atrial tachycardia ([place]) 11/17/2015  . Diabetes ([place]) 05/13/2015  . Acute upper respiratory infection 02/09/2015  . Ischemic cardiomyopathy   . Non-ST elevated myocardial infarction (non-STEMI) ([place])   . Shortness of breath 10/23/2014  . Elevated troponin 10/23/2014  . Congestive heart failure (CHF) ([place]) 10/23/2014  . Acute respiratory failure with hypoxia ([place]) 10/23/2014  . Acute encephalopathy 02/02/2013  . GI bleed 02/02/2013  . Chest pain 02/01/2013  . 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 History of colon cancer   . Obesity   . ESRD on dialysis ([place]) 05/28/2012  . Nausea 05/21/2011  . Anemia 05/21/2011  . Coronary artery disease   . CVA (cerebral infarction) 06/01/2010  . Atrial tachycardia    . LBBB (left bundle branch block) 04/10/2008  . Hyperlipidemia   .  Hypertensive heart disease     Past Surgical History:  Procedure Laterality Date  . ARTERIOVENOUS GRAFT PLACEMENT Left 2010   "forearm"  . CARDIAC CATHETERIZATION    . CARDIAC CATHETERIZATION N/A 10/27/2014   Procedure: Left Heart Cath and Coronary Angiography;  Surgeon: Leonie Man, MD;  Location: [facility] LAB;  Service: Cardiovascular;  Laterality: N/A;  . CARDIAC CATHETERIZATION N/A 10/28/2014   Procedure: Coronary Stent Intervention;  Surgeon: Peter M Martinique, MD;  Location: [facility];  Service: Cardiovascular;  Laterality: N/A;  . CARDIAC CATHETERIZATION Bilateral 12/2008    R. heart cath showed elevated left and right heart filling pressures w/ pulmonary artery pressure elevated mildly out of proportion to the wedge. The left heart cath showed diffuse distal vessel disease as well as a 75% stenosis in the mid circumflex w/ a 90% stenosis of the ostial first obtuse marginal. These lesions were in close proximity. there was a 60-70%mild RCA stenosis.   [name] CATARACT EXTRACTION W/ INTRAOCULAR LENS  IMPLANT, BILATERAL Bilateral   . COLON SURGERY    . COLONOSCOPY    . COLOSTOMY  1986  . ELECTROCARDIOGRAM  04-27-06  . ELECTROPHYSIOLOGIC STUDY N/A 11/17/2015   Procedure: A-Tach Ablation;  Surgeon: Will Meredith Leeds, MD;  Location: [facility];  Service: Cardiovascular;  Laterality: N/A;  . ESOPHAGOGASTRODUODENOSCOPY  03-18-04  . ESOPHAGOGASTRODUODENOSCOPY N/A 02/02/2013   Procedure: ESOPHAGOGASTRODUODENOSCOPY (EGD);  Surgeon: Irene Shipper, MD;  Location: [facility] ENDOSCOPY;  Service: Endoscopy;  Laterality: N/A;  . EYE SURGERY    . FOOT AMPUTATION THROUGH METATARSAL  10-07-10   Right foot transmetatarsal  . INSERTION OF AHMED VALVE Right 08/20/2013   Procedure: INSERTION OF AHMED VALVE WITH Mitomycin C application;  Surgeon: Marylynn Pearson, MD;  Location: [facility];  Service: Ophthalmology;  Laterality: Right;  . INSERTION OF AHMED VALVE Left 07/22/2014   Procedure: INSERTION OF AHMED VALVE WITH [place];  Surgeon: Marylynn Pearson, MD;  Location: [facility];  Service: Ophthalmology;  Laterality: Left;  . MITOMYCIN C APPLICATION Left 8/58/8502   Procedure: MITOMYCIN C APPLICATION;  Surgeon: Marylynn Pearson, MD;  Location: [facility];  Service: Ophthalmology;  Laterality: Left;  . PARS PLANA VITRECTOMY  11/29/2011   Procedure: PARS PLANA VITRECTOMY WITH 23 GAUGE;  Surgeon: Adonis Brook, MD;  Location: [facility];  Service: Ophthalmology;  Laterality: Right;  Right Eye 23 ga vitrectomy with membrane peel  . PARS PLANA VITRECTOMY Left 02/28/2012   Procedure: PARS PLANA VITRECTOMY WITH 23 GAUGE;   Surgeon: Adonis Brook, MD;  Location: [facility];  Service: Ophthalmology;  Laterality: Left;  . REVISION UROSTOMY CUTANEOUS    . SUPRAVENTRICULAR TACHYCARDIA ABLATION  11/17/2015  . VAGINAL HYSTERECTOMY      OB History    No data available       Home Medications    Prior to Admission medications   Medication Sig Start Date End Date Taking? Authorizing Provider  acetaminophen (TYLENOL) 500 MG tablet Take 500 mg by mouth every 6 (six) hours as needed (pain).   Yes [provider]  amiodarone (PACERONE) 200 MG tablet Take 1 tablet (200 mg total) by mouth daily. 08/10/15  Yes Richardson Dopp T, PA-C  aspirin 81 MG tablet Take 1 tablet (81 mg total) by mouth daily. 10/29/14  Yes Dhungel, Nishant, MD  BRILINTA 90 MG TABS tablet TAKE 1 TABLET BY MOUTH TWICE DAILY 02/19/15  Yes Deboraha Sprang, MD  calcium acetate (PHOSLO) 667 MG capsule Take 1,334-2,001 mg by  mouth See admin instructions. Take 2001mg  three times daily with meals and Take 1334mg   with snacks.   Yes [provider]  dorzolamide-timolol (COSOPT) 22.3-6.8 MG/ML ophthalmic solution Place 1 drop into the left eye 2 (two) times daily. 11/09/15  Yes [provider]  famotidine (PEPCID) 20 MG tablet Take 1 tablet (20 mg total) by mouth 2 (two) times daily. 04/25/16  Yes Plotnikov, Evie Lacks, MD  Fluticasone-Salmeterol (ADVAIR) 250-50 MCG/DOSE AEPB Inhale 1 puff into the lungs daily as needed (shortness of breath).  05/28/12  Yes Plotnikov, Evie Lacks, MD  glucose blood (ONE TOUCH ULTRA TEST) test strip 1 each by Other route daily. And lancets 1/day 03/16/16  Yes Renato Shin, MD  metoprolol (LOPRESSOR) 50 MG tablet Take 1 tablet (50 mg total) by mouth 2 (two) times daily. Do not take this medication on dialysis days (Monday, Wednesday, Friday). 01/18/16  Yes Plotnikov, Evie Lacks, MD  repaglinide (PRANDIN) 0.5 MG tablet TAKE 1 TABLET BY MOUTH THREE TIMES DAILY BEFORE MEALS 03/16/16  Yes Renato Shin, MD  simvastatin (ZOCOR) 10  MG tablet Take 1 tablet (10 mg total) by mouth every evening. 06/01/15  Yes Seiler, [employer] K, NP  nitroGLYCERIN (NITROSTAT) 0.4 MG SL tablet Place 1 tablet (0.4 mg total) under the tongue every 5 (five) minutes as needed for chest pain. 10/29/14   Louellen Molder, MD    Family History Family History  Problem Relation Age of Onset  . Hypertension Mother   . Coronary artery disease Mother   . Hypertension Father   . Diabetes Father   . Cancer Sister     colon  . Hypertension Other     Social History Social History  Substance Use Topics  . Smoking status: Never Smoker  . Smokeless tobacco: Never Used  . Alcohol use No     Allergies   Ace inhibitors; Eggs or egg-derived products; Enalapril; Lisinopril; and Omnipaque [iohexol]   Review of Systems Review of Systems  Respiratory: Positive for cough and shortness of breath.   All other systems reviewed and are negative.    Physical Exam Updated Vital Signs BP 118/74   Pulse 100   Temp 97.7 F (36.5 C) (Axillary)   Resp (!) 23   Ht 5' (1.524 m)   Wt 82.1 kg   SpO2 100%   BMI 35.35 kg/m   Physical Exam  Constitutional: She is oriented to person, place, and time. She appears well-developed and well-nourished.  HENT:  Head: Normocephalic and atraumatic.  Mouth/Throat: Oropharynx is clear and moist.  Eyes: Conjunctivae and EOM are normal. Pupils are equal, round, and reactive to light.  Neck: Normal range of motion.  Cardiovascular: Normal rate, regular rhythm and normal heart sounds.   Pulmonary/Chest: Effort normal and breath sounds normal. No respiratory distress. She has no wheezes.  Lungs clear, no distress, work of breathing is normal, able to speak in full sentences without issue  Abdominal: Soft. Bowel sounds are normal. There is no tenderness. There is no rebound.  Musculoskeletal: Normal range of motion.  Dialysis fistula LUE, thrill noted  Neurological: She is alert and oriented to person, place, and time.    Skin: Skin is warm and dry.  Psychiatric: She has a normal mood and affect.  Nursing note and vitals reviewed.    ED Treatments / Results  Labs (all labs ordered are listed, but only abnormal results are displayed) Labs Reviewed  CBC WITH DIFFERENTIAL/PLATELET - Abnormal; Notable for the following:  Result Value   RDW 17.0 (*)    All other components within normal limits  BASIC METABOLIC PANEL - Abnormal; Notable for the following:    Chloride 97 (*)    Glucose, Bld 110 (*)    BUN 24 (*)    Creatinine, Ser 4.22 (*)    GFR calc non Af Amer 10 (*)    GFR calc Af Amer 11 (*)    All other components within normal limits  I-STAT TROPOININ, ED    EKG  EKG Interpretation None       Radiology Dg Chest 2 View  Result Date: 05/16/2016 CLINICAL DATA:  Short of breath for 2 days EXAM: CHEST  2 VIEW COMPARISON:  10/23/2014 FINDINGS: The heart is moderately enlarged. Clear lungs. Normal vascularity. No pneumothorax or pleural effusion. IMPRESSION: Cardiomegaly without decompensation. Electronically Signed   By: Marybelle Killings M.D.   On: 05/16/2016 13:09    Procedures Procedures (including critical care time)  Medications Ordered in ED Medications  albuterol (PROVENTIL HFA;VENTOLIN HFA) 108 (90 Base) MCG/ACT inhaler 2 puff (2 puffs Inhalation Given 05/16/16 1444)     Initial Impression / Assessment and Plan / ED Course  I have reviewed the triage vital signs and the nursing notes.  Pertinent labs & imaging results that were available during my care of the patient were reviewed by me and considered in my medical decision making (see chart for details).  71 y.o. F here with shortness of breath. Has been ongoing for 3 weeks now. She reports associated, concern for maybe an allergic component. Reports history same since moving to [place] from [place]. She is afebrile and nontoxic.  Her lungs are clear without wheezes or rhonchi. She is in no acute distress. Her vitals are stable on room air.  Screening lab work is overall reassuring-- renal function appears baseline given her HD status. Chest x-ray is clear.  Patient has remained stable here, continues to feel well.  At this time, low suspicion for acute ACS, PE, dissection, or other acute cardiac event.  Had full HD treatment yesterday and does not appear fluid overloaded.  Does report her inhaler recently expired so has been unable to use-- have given new one from ED.  Have also recommended possible an allergy medicine if she feels it is needed.  Will have her follow-up closely with her primary care doctor.  Discussed plan with patient, he/she acknowledged understanding and agreed with plan of care.  Return precautions given for new or worsening symptoms.  Patient seen and evaluated with attending physician, Dr. Vanita Panda.  Final Clinical Impressions(s) / ED Diagnoses   Final diagnoses:  Shortness of breath  Cough    New Prescriptions New Prescriptions   No medications on file     [name], PA-C 05/16/16 1451    Carmin Muskrat, MD 05/21/16 [phone]

## 2016-05-16 NOTE — ED Notes (Signed)
Patient transported to X-ray 

## 2016-05-16 NOTE — ED Triage Notes (Signed)
Pt. Reports having sob progressivle becoming worse in the last 3 weeks.  She denies any cold symptoms or cough. She denies any n/v/d  Skin is warm and dry.  Pt. Denies any chest pain or dizziness.  Pt. Has no swelling noted.  Pt. Does dialysis Mon. Wed. And Friday.  She had a complete session Monday.

## 2016-05-16 NOTE — ED Notes (Signed)
Warm blankets given.

## 2016-05-17 DIAGNOSIS — N2581 Secondary hyperparathyroidism of renal origin: Secondary | ICD-10-CM | POA: Diagnosis not present

## 2016-05-17 DIAGNOSIS — D509 Iron deficiency anemia, unspecified: Secondary | ICD-10-CM | POA: Diagnosis not present

## 2016-05-17 DIAGNOSIS — E119 Type 2 diabetes mellitus without complications: Secondary | ICD-10-CM | POA: Diagnosis not present

## 2016-05-17 DIAGNOSIS — N186 End stage renal disease: Secondary | ICD-10-CM | POA: Diagnosis not present

## 2016-05-17 DIAGNOSIS — D631 Anemia in chronic kidney disease: Secondary | ICD-10-CM | POA: Diagnosis not present

## 2016-05-19 DIAGNOSIS — D631 Anemia in chronic kidney disease: Secondary | ICD-10-CM | POA: Diagnosis not present

## 2016-05-19 DIAGNOSIS — E119 Type 2 diabetes mellitus without complications: Secondary | ICD-10-CM | POA: Diagnosis not present

## 2016-05-19 DIAGNOSIS — N186 End stage renal disease: Secondary | ICD-10-CM | POA: Diagnosis not present

## 2016-05-19 DIAGNOSIS — N2581 Secondary hyperparathyroidism of renal origin: Secondary | ICD-10-CM | POA: Diagnosis not present

## 2016-05-19 DIAGNOSIS — D509 Iron deficiency anemia, unspecified: Secondary | ICD-10-CM | POA: Diagnosis not present

## 2016-05-22 DIAGNOSIS — E119 Type 2 diabetes mellitus without complications: Secondary | ICD-10-CM | POA: Diagnosis not present

## 2016-05-22 DIAGNOSIS — D631 Anemia in chronic kidney disease: Secondary | ICD-10-CM | POA: Diagnosis not present

## 2016-05-22 DIAGNOSIS — N186 End stage renal disease: Secondary | ICD-10-CM | POA: Diagnosis not present

## 2016-05-22 DIAGNOSIS — N2581 Secondary hyperparathyroidism of renal origin: Secondary | ICD-10-CM | POA: Diagnosis not present

## 2016-05-22 DIAGNOSIS — D509 Iron deficiency anemia, unspecified: Secondary | ICD-10-CM | POA: Diagnosis not present

## 2016-05-23 ENCOUNTER — Encounter: Payer: Self-pay | Admitting: Cardiology

## 2016-05-23 ENCOUNTER — Ambulatory Visit (INDEPENDENT_AMBULATORY_CARE_PROVIDER_SITE_OTHER): Payer: Medicare Other | Admitting: Cardiology

## 2016-05-23 VITALS — BP 100/62 | HR 73 | Ht 60.0 in | Wt 176.0 lb

## 2016-05-23 DIAGNOSIS — Z79899 Other long term (current) drug therapy: Secondary | ICD-10-CM

## 2016-05-23 DIAGNOSIS — I255 Ischemic cardiomyopathy: Secondary | ICD-10-CM

## 2016-05-23 DIAGNOSIS — I471 Supraventricular tachycardia: Secondary | ICD-10-CM

## 2016-05-23 LAB — HEPATIC FUNCTION PANEL
ALT: 16 IU/L (ref 0–32)
AST: 17 IU/L (ref 0–40)
Albumin: 3.2 g/dL — ABNORMAL LOW (ref 3.5–4.8)
Alkaline Phosphatase: 119 IU/L — ABNORMAL HIGH (ref 39–117)
Bilirubin Total: 0.6 mg/dL (ref 0.0–1.2)
Bilirubin, Direct: 0.35 mg/dL (ref 0.00–0.40)
Total Protein: 6.4 g/dL (ref 6.0–8.5)

## 2016-05-23 LAB — TSH: TSH: 1.01 u[IU]/mL (ref 0.450–4.500)

## 2016-05-23 MED ORDER — SIMVASTATIN 10 MG PO TABS: 10.0000 mg | ORAL_TABLET | Freq: Every evening | ORAL | 3 refills | Status: AC

## 2016-05-23 NOTE — Progress Notes (Signed)
Electrophysiology Office Note   Date:  05/23/2016   ID:  Alexandr, Oehler Apr 19, 1945, MRN [id number]  PCP:  Cassandria Anger, MD  Primary Electrophysiologist: Caryl Comes    Chief Complaint  Patient presents with  . Follow-up    Atrial tachycardia     History of Present Illness: CLARE FENNIMORE is a 71 y.o. female who presents today for electrophysiology evaluation.   history of ESRF on HD, HTN, DM, LBBB, PAT on amiodarone, CAD, admitted 10/16with acute hypoxic respiratory failure in the setting of non-STEMI. She required urgent dialysis. Follow-up echocardiogram demonstrated worsening LV function with an EF of 35-40%. Cardiac catheterization demonstrated severe multivessel disease with diffuse calcified LAD and distal LAD disease in conjunction with new findings in the proximal D1 and mid RCA. Sheunderwent PCI with a DES to the D1. Unsuccessful attempt was made at PCI of the proximal RCA.  Was seen in clinic previously for dyspnea and found to be in AT vs ST. Amiodarone was increased to 200 mg BID. Had atrial tachycardia ablation 11/17/15 which was located in the low lateral RA.  She was in the emergency room last week with shortness of breath. Was found to be nontoxic and was discharged. She was in sinus tachycardia with rates of 100-120. She returns to clinic today feeling well. She does not have any chest pain, PND, orthopnea, or palpitations. She complains of shortness of breath when she exerts herself, but this is improved since her emergency room visit. She has not noted any palpitations since her ablation. Her EKG in the emergency room was consistent with sinus tachycardia.   Past Medical History:  Diagnosis Date  . Anemia   . Arthritis    "hands" (11/17/2015)  . Atrial tachycardia ([place])    on amiod  . Cardiomyopathy- mixed   . CHF (congestive heart failure) ([place])   . Chronic diastolic heart failure ([place])   . Colon cancer ([place]) 1986  . Colostomy in place [facility])   .  Coronary artery disease    Previously decreased EF; echo 113 normal LV function  . ESRD (end stage renal disease) on dialysis [facility])    Dr Dunham/Dr. Lyda Kalata.  M, W, Fr; [place] (11/17/2015)  . GERD (gastroesophageal reflux disease)   . Glaucoma   . Heart murmur   . Hernia, incisional    abd  . Hyperlipidemia   . Hypertension   . Hypertensive heart disease    sees Dr. Alain Marion  . LBBB (left bundle branch block)   . Mitral valve insufficiency and aortic valve insufficiency   . Obesity   . Stroke [facility])    2011/12  . Type II diabetes mellitus ([place])    Past Surgical History:  Procedure Laterality Date  . ARTERIOVENOUS GRAFT PLACEMENT Left 2010   "forearm"  . CARDIAC CATHETERIZATION    . CARDIAC CATHETERIZATION N/A 10/27/2014   Procedure: Left Heart Cath and Coronary Angiography;  Surgeon: Leonie Man, MD;  Location: [place] CV LAB;  Service: Cardiovascular;  Laterality: N/A;  . CARDIAC CATHETERIZATION N/A 10/28/2014   Procedure: Coronary Stent Intervention;  Surgeon: Peter M Martinique, MD;  Location: [place] CV LAB;  Service: Cardiovascular;  Laterality: N/A;  . CARDIAC CATHETERIZATION Bilateral 12/2008   R. heart cath showed elevated left and right heart filling pressures w/ pulmonary artery pressure elevated mildly out of proportion to the wedge. The left heart cath showed diffuse distal vessel disease as well as a 75% stenosis in the mid circumflex  w/ a 90% stenosis of the ostial first obtuse marginal. These lesions were in close proximity. there was a 60-70%mild RCA stenosis.   [name] CATARACT EXTRACTION W/ INTRAOCULAR LENS  IMPLANT, BILATERAL Bilateral   . COLON SURGERY    . COLONOSCOPY    . COLOSTOMY  1986  . ELECTROCARDIOGRAM  04-27-06  . ELECTROPHYSIOLOGIC STUDY N/A 11/17/2015   Procedure: A-Tach Ablation;  Surgeon: Will Meredith Leeds, MD;  Location: [place] CV LAB;  Service: Cardiovascular;  Laterality: N/A;  . ESOPHAGOGASTRODUODENOSCOPY  03-18-04  .  ESOPHAGOGASTRODUODENOSCOPY N/A 02/02/2013   Procedure: ESOPHAGOGASTRODUODENOSCOPY (EGD);  Surgeon: Irene Shipper, MD;  Location: [facility] ENDOSCOPY;  Service: Endoscopy;  Laterality: N/A;  . EYE SURGERY    . FOOT AMPUTATION THROUGH METATARSAL  10-07-10   Right foot transmetatarsal  . INSERTION OF AHMED VALVE Right 08/20/2013   Procedure: INSERTION OF AHMED VALVE WITH Mitomycin C application;  Surgeon: Marylynn Pearson, MD;  Location: [place];  Service: Ophthalmology;  Laterality: Right;  . INSERTION OF AHMED VALVE Left 07/22/2014   Procedure: INSERTION OF AHMED VALVE WITH [place];  Surgeon: Marylynn Pearson, MD;  Location: [place];  Service: Ophthalmology;  Laterality: Left;  . MITOMYCIN C APPLICATION Left 1/54/0086   Procedure: MITOMYCIN C APPLICATION;  Surgeon: Marylynn Pearson, MD;  Location: [place];  Service: Ophthalmology;  Laterality: Left;  . PARS PLANA VITRECTOMY  11/29/2011   Procedure: PARS PLANA VITRECTOMY WITH 23 GAUGE;  Surgeon: Adonis Brook, MD;  Location: [place];  Service: Ophthalmology;  Laterality: Right;  Right Eye 23 ga vitrectomy with membrane peel  . PARS PLANA VITRECTOMY Left 02/28/2012   Procedure: PARS PLANA VITRECTOMY WITH 23 GAUGE;  Surgeon: Adonis Brook, MD;  Location: [place];  Service: Ophthalmology;  Laterality: Left;  . REVISION UROSTOMY CUTANEOUS    . SUPRAVENTRICULAR TACHYCARDIA ABLATION  11/17/2015  . VAGINAL HYSTERECTOMY       Current Outpatient Prescriptions  Medication Sig Dispense Refill  . acetaminophen (TYLENOL) 500 MG tablet Take 500 mg by mouth every 6 (six) hours as needed (pain).    [name] amiodarone (PACERONE) 200 MG tablet Take 1 tablet (200 mg total) by mouth daily. 90 tablet 3  . aspirin 81 MG tablet Take 1 tablet (81 mg total) by mouth daily. 30 tablet 3  . BRILINTA 90 MG TABS tablet TAKE 1 TABLET BY MOUTH TWICE DAILY 60 tablet 9  . calcium acetate (PHOSLO) 667 MG capsule Take 1,334-2,001 mg by mouth See admin instructions. Take 2001mg  three times daily with meals and  Take 1334mg   with snacks.    . dorzolamide-timolol (COSOPT) 22.3-6.8 MG/ML ophthalmic solution Place 1 drop into the left eye 2 (two) times daily.  12  . famotidine (PEPCID) 20 MG tablet Take 1 tablet (20 mg total) by mouth 2 (two) times daily. 60 tablet 11  . Fluticasone-Salmeterol (ADVAIR) 250-50 MCG/DOSE AEPB Inhale 1 puff into the lungs daily as needed (shortness of breath).     [name] glucose blood (ONE TOUCH ULTRA TEST) test strip 1 each by Other route daily. And lancets 1/day 100 each 3  . metoprolol (LOPRESSOR) 50 MG tablet Take 1 tablet (50 mg total) by mouth 2 (two) times daily. Do not take this medication on dialysis days (Monday, Wednesday, Friday). 60 tablet 11  . nitroGLYCERIN (NITROSTAT) 0.4 MG SL tablet Place 1 tablet (0.4 mg total) under the tongue every 5 (five) minutes as needed for chest pain. 30 tablet 3  . repaglinide (PRANDIN) 0.5 MG tablet TAKE 1 TABLET  BY MOUTH THREE TIMES DAILY BEFORE MEALS 90 tablet 3  . simvastatin (ZOCOR) 10 MG tablet Take 1 tablet (10 mg total) by mouth every evening. 90 tablet 3   No current facility-administered medications for this visit.    Facility-Administered Medications Ordered in Other Visits  Medication Dose Route Frequency Provider Last Rate Last Dose  . mitoMYcin (MUTAMYCIN) Injection Use in OR only (0.4 mg/ml)  0.5 mL Left Eye Once Marylynn Pearson, MD        Allergies:   Ace inhibitors; Eggs or egg-derived products; Enalapril; Lisinopril; and Omnipaque [iohexol]   Social History:  The patient  reports that she has never smoked. She has never used smokeless tobacco. She reports that she does not drink alcohol or use drugs.   Family History:  The patient's family history includes Cancer in her sister; Coronary artery disease in her mother; Diabetes in her father; Hypertension in her father, mother, and other.    ROS:  Please see the history of present illness.   Otherwise, review of systems is positive for Weight change, fatigue, visual  disturbance, balance problems.   All other systems are reviewed and negative.    PHYSICAL EXAM: VS:  BP 100/62   Pulse 73   Ht 5' (1.524 m)   Wt 176 lb (79.8 kg)   SpO2 99%   BMI 34.37 kg/m  , BMI Body mass index is 34.37 kg/m. GEN: Well nourished, well developed, in no acute distress  HEENT: normal  Neck: no JVD, carotid bruits, or masses Cardiac: RRR; no murmurs, rubs, or gallops,no edema  Respiratory:  clear to auscultation bilaterally, normal work of breathing GI: soft, nontender, nondistended, + BS MS: no deformity or atrophy  Skin: warm and dry Neuro:  Strength and sensation are intact Psych: euthymic mood, full affect  EKG:  EKG is not ordered today. Personal review of the ekg ordered 05/16/16 shows nonspecific interventricular conduction delay, sinus tachycardia, rate 102, PVCs, left atrial enlargement  Recent Labs: 01/18/2016: ALT 7; TSH 1.44 05/16/2016: BUN 24; Creatinine, Ser 4.22; Hemoglobin 13.1; Platelets 290; Potassium 4.1; Sodium 142    Lipid Panel     Component Value Date/Time   CHOL 166 10/25/2014 0545   TRIG 205 (H) 10/25/2014 0545   HDL 27 (L) 10/25/2014 0545   CHOLHDL 6.1 10/25/2014 0545   VLDL 41 (H) 10/25/2014 0545   LDLCALC 98 10/25/2014 0545     Wt Readings from Last 3 Encounters:  05/23/16 176 lb (79.8 kg)  05/16/16 181 lb (82.1 kg)  04/25/16 181 lb 1.9 oz (82.2 kg)      Other studies Reviewed: Additional studies/ records that were reviewed today include: TTE 08/03/15 Review of the above records today demonstrates:  - Left ventricle: The cavity size was normal. Wall thickness was   increased in a pattern of moderate LVH. Systolic function was   severely reduced. The estimated ejection fraction was in the   range of 25% to 30%. Diffuse hypokinesis. - Ventricular septum: Septal motion showed abnormal function and   dyssynergy. - Aortic valve: There was mild regurgitation. - Mitral valve: Severely calcified annulus. Mildly thickened    leaflets . There was moderate regurgitation directed posteriorly. - Left atrium: The atrium was moderately dilated.   Anterior-posterior dimension: 51 mm. - Tricuspid valve: There was mild regurgitation.  Personal review of the Holter monitor: Dominant Rhythm  Sinus     Mean HR 101 Range HR 79-133   Total beats 148K PVC 19 PAC -  Tachycardia yes   Pauses none    ASSESSMENT AND PLAN:  1.  Atrial tachycardia: ablation 11/17/15 in the low lateral RA.Maintaining sinus rhythm. Continue current management.  2. DOE: Dyspnea on exertion is likely multifactorial. Possibly due to the comminution of her heart failure as well as deconditioning and end-stage renal disease. No changes made today.  3. CAD: Currently no chest pain. Continue [place] today. Refilled Zocor.  4. Cardiomyopathy: Currently on beta blockers. Hypotension with dialysis limits further therapy.    Current medicines are reviewed at length with the patient today.   The patient does not have concerns regarding her medicines.  The following changes were made today:  none  Labs/ tests ordered today include:  Orders Placed This Encounter  Procedures  . TSH  . Hepatic function panel     Disposition:   FU with Will Camnitz 6 months    Signed, Will Meredith Leeds, MD  05/23/2016 10:22 AM     [facility] [address] [place] [address] [phone] (office) [phone] (fax)

## 2016-05-23 NOTE — Patient Instructions (Addendum)
Medication Instructions:    Your physician recommends that you continue on your current medications as directed. Please refer to the Current Medication list given to you today.  Labwork:  Today, Amiodarone medication surveillance blood work: TSH & LFTs  Testing/Procedures:  None ordered  Follow-Up:  Your physician wants you to follow-up in: 6 months with Dr. Curt Bears.  You will receive a reminder letter in the mail two months in advance. If you don't receive a letter, please call our office to schedule the follow-up appointment.  - If you need a refill on your cardiac medications before your next appointment, please call your pharmacy.   Thank you for choosing [facility]!!   Trinidad Curet, RN [phone]

## 2016-05-24 ENCOUNTER — Telehealth: Payer: Self-pay | Admitting: Internal Medicine

## 2016-05-24 DIAGNOSIS — D631 Anemia in chronic kidney disease: Secondary | ICD-10-CM | POA: Diagnosis not present

## 2016-05-24 DIAGNOSIS — N2581 Secondary hyperparathyroidism of renal origin: Secondary | ICD-10-CM | POA: Diagnosis not present

## 2016-05-24 DIAGNOSIS — E119 Type 2 diabetes mellitus without complications: Secondary | ICD-10-CM | POA: Diagnosis not present

## 2016-05-24 DIAGNOSIS — R112 Nausea with vomiting, unspecified: Secondary | ICD-10-CM

## 2016-05-24 DIAGNOSIS — D509 Iron deficiency anemia, unspecified: Secondary | ICD-10-CM | POA: Diagnosis not present

## 2016-05-24 DIAGNOSIS — N186 End stage renal disease: Secondary | ICD-10-CM | POA: Diagnosis not present

## 2016-05-24 NOTE — Telephone Encounter (Signed)
Pt would like a referral to GI, pt cant eat and they would like to see someone in GI asap    Daughters number cell number

## 2016-05-25 NOTE — Telephone Encounter (Signed)
Please advise about referral. Or should patient come into office?

## 2016-05-25 NOTE — Telephone Encounter (Signed)
What is the reason? Thx

## 2016-05-26 DIAGNOSIS — N2581 Secondary hyperparathyroidism of renal origin: Secondary | ICD-10-CM | POA: Diagnosis not present

## 2016-05-26 DIAGNOSIS — E119 Type 2 diabetes mellitus without complications: Secondary | ICD-10-CM | POA: Diagnosis not present

## 2016-05-26 DIAGNOSIS — N186 End stage renal disease: Secondary | ICD-10-CM | POA: Diagnosis not present

## 2016-05-26 DIAGNOSIS — D631 Anemia in chronic kidney disease: Secondary | ICD-10-CM | POA: Diagnosis not present

## 2016-05-26 DIAGNOSIS — D509 Iron deficiency anemia, unspecified: Secondary | ICD-10-CM | POA: Diagnosis not present

## 2016-05-26 NOTE — Telephone Encounter (Signed)
Unable to reach daughter to find out more information will try later.

## 2016-05-26 NOTE — Telephone Encounter (Signed)
Pts daughter states she she can not keep food down, goes through a box of alka-seltzer in a 5 day period, and has no appetite.

## 2016-05-29 DIAGNOSIS — D509 Iron deficiency anemia, unspecified: Secondary | ICD-10-CM | POA: Diagnosis not present

## 2016-05-29 DIAGNOSIS — N186 End stage renal disease: Secondary | ICD-10-CM | POA: Diagnosis not present

## 2016-05-29 DIAGNOSIS — D631 Anemia in chronic kidney disease: Secondary | ICD-10-CM | POA: Diagnosis not present

## 2016-05-29 DIAGNOSIS — E119 Type 2 diabetes mellitus without complications: Secondary | ICD-10-CM | POA: Diagnosis not present

## 2016-05-29 DIAGNOSIS — N2581 Secondary hyperparathyroidism of renal origin: Secondary | ICD-10-CM | POA: Diagnosis not present

## 2016-05-31 DIAGNOSIS — E119 Type 2 diabetes mellitus without complications: Secondary | ICD-10-CM | POA: Diagnosis not present

## 2016-05-31 DIAGNOSIS — N186 End stage renal disease: Secondary | ICD-10-CM | POA: Diagnosis not present

## 2016-05-31 DIAGNOSIS — N2581 Secondary hyperparathyroidism of renal origin: Secondary | ICD-10-CM | POA: Diagnosis not present

## 2016-05-31 DIAGNOSIS — D631 Anemia in chronic kidney disease: Secondary | ICD-10-CM | POA: Diagnosis not present

## 2016-05-31 DIAGNOSIS — D509 Iron deficiency anemia, unspecified: Secondary | ICD-10-CM | POA: Diagnosis not present

## 2016-05-31 NOTE — Telephone Encounter (Signed)
Please advise about GI referral 

## 2016-05-31 NOTE — Telephone Encounter (Signed)
Pt daughter called wanting to know if a referral for gastro will be put in for her mothers stomach, would like a call back.    [name] [phone]

## 2016-05-31 NOTE — Telephone Encounter (Signed)
Done. Thx.

## 2016-06-01 NOTE — Telephone Encounter (Signed)
LM notifying daughter 

## 2016-06-02 DIAGNOSIS — E119 Type 2 diabetes mellitus without complications: Secondary | ICD-10-CM | POA: Diagnosis not present

## 2016-06-02 DIAGNOSIS — D631 Anemia in chronic kidney disease: Secondary | ICD-10-CM | POA: Diagnosis not present

## 2016-06-02 DIAGNOSIS — N186 End stage renal disease: Secondary | ICD-10-CM | POA: Diagnosis not present

## 2016-06-02 DIAGNOSIS — D509 Iron deficiency anemia, unspecified: Secondary | ICD-10-CM | POA: Diagnosis not present

## 2016-06-02 DIAGNOSIS — N2581 Secondary hyperparathyroidism of renal origin: Secondary | ICD-10-CM | POA: Diagnosis not present

## 2016-06-05 DIAGNOSIS — D509 Iron deficiency anemia, unspecified: Secondary | ICD-10-CM | POA: Diagnosis not present

## 2016-06-05 DIAGNOSIS — D631 Anemia in chronic kidney disease: Secondary | ICD-10-CM | POA: Diagnosis not present

## 2016-06-05 DIAGNOSIS — N186 End stage renal disease: Secondary | ICD-10-CM | POA: Diagnosis not present

## 2016-06-05 DIAGNOSIS — E119 Type 2 diabetes mellitus without complications: Secondary | ICD-10-CM | POA: Diagnosis not present

## 2016-06-05 DIAGNOSIS — N2581 Secondary hyperparathyroidism of renal origin: Secondary | ICD-10-CM | POA: Diagnosis not present

## 2016-06-07 DIAGNOSIS — N2581 Secondary hyperparathyroidism of renal origin: Secondary | ICD-10-CM | POA: Diagnosis not present

## 2016-06-07 DIAGNOSIS — D631 Anemia in chronic kidney disease: Secondary | ICD-10-CM | POA: Diagnosis not present

## 2016-06-07 DIAGNOSIS — D509 Iron deficiency anemia, unspecified: Secondary | ICD-10-CM | POA: Diagnosis not present

## 2016-06-07 DIAGNOSIS — E119 Type 2 diabetes mellitus without complications: Secondary | ICD-10-CM | POA: Diagnosis not present

## 2016-06-07 DIAGNOSIS — N186 End stage renal disease: Secondary | ICD-10-CM | POA: Diagnosis not present

## 2016-06-08 DIAGNOSIS — N186 End stage renal disease: Secondary | ICD-10-CM | POA: Diagnosis not present

## 2016-06-08 DIAGNOSIS — Z992 Dependence on renal dialysis: Secondary | ICD-10-CM | POA: Diagnosis not present

## 2016-06-08 DIAGNOSIS — E1129 Type 2 diabetes mellitus with other diabetic kidney complication: Secondary | ICD-10-CM | POA: Diagnosis not present

## 2016-06-09 DIAGNOSIS — D509 Iron deficiency anemia, unspecified: Secondary | ICD-10-CM | POA: Diagnosis not present

## 2016-06-09 DIAGNOSIS — E119 Type 2 diabetes mellitus without complications: Secondary | ICD-10-CM | POA: Diagnosis not present

## 2016-06-09 DIAGNOSIS — N186 End stage renal disease: Secondary | ICD-10-CM | POA: Diagnosis not present

## 2016-06-09 DIAGNOSIS — D631 Anemia in chronic kidney disease: Secondary | ICD-10-CM | POA: Diagnosis not present

## 2016-06-09 DIAGNOSIS — N2581 Secondary hyperparathyroidism of renal origin: Secondary | ICD-10-CM | POA: Diagnosis not present

## 2016-06-12 DIAGNOSIS — D509 Iron deficiency anemia, unspecified: Secondary | ICD-10-CM | POA: Diagnosis not present

## 2016-06-12 DIAGNOSIS — E119 Type 2 diabetes mellitus without complications: Secondary | ICD-10-CM | POA: Diagnosis not present

## 2016-06-12 DIAGNOSIS — N2581 Secondary hyperparathyroidism of renal origin: Secondary | ICD-10-CM | POA: Diagnosis not present

## 2016-06-12 DIAGNOSIS — N186 End stage renal disease: Secondary | ICD-10-CM | POA: Diagnosis not present

## 2016-06-12 DIAGNOSIS — D631 Anemia in chronic kidney disease: Secondary | ICD-10-CM | POA: Diagnosis not present

## 2016-06-13 ENCOUNTER — Ambulatory Visit: Payer: Medicare Other | Admitting: Internal Medicine

## 2016-06-14 DIAGNOSIS — D509 Iron deficiency anemia, unspecified: Secondary | ICD-10-CM | POA: Diagnosis not present

## 2016-06-14 DIAGNOSIS — E119 Type 2 diabetes mellitus without complications: Secondary | ICD-10-CM | POA: Diagnosis not present

## 2016-06-14 DIAGNOSIS — D631 Anemia in chronic kidney disease: Secondary | ICD-10-CM | POA: Diagnosis not present

## 2016-06-14 DIAGNOSIS — N186 End stage renal disease: Secondary | ICD-10-CM | POA: Diagnosis not present

## 2016-06-14 DIAGNOSIS — N2581 Secondary hyperparathyroidism of renal origin: Secondary | ICD-10-CM | POA: Diagnosis not present

## 2016-06-15 ENCOUNTER — Ambulatory Visit: Payer: Medicare Other | Admitting: Endocrinology

## 2016-06-15 ENCOUNTER — Encounter: Payer: Self-pay | Admitting: Internal Medicine

## 2016-06-16 DIAGNOSIS — D631 Anemia in chronic kidney disease: Secondary | ICD-10-CM | POA: Diagnosis not present

## 2016-06-16 DIAGNOSIS — D509 Iron deficiency anemia, unspecified: Secondary | ICD-10-CM | POA: Diagnosis not present

## 2016-06-16 DIAGNOSIS — E119 Type 2 diabetes mellitus without complications: Secondary | ICD-10-CM | POA: Diagnosis not present

## 2016-06-16 DIAGNOSIS — N2581 Secondary hyperparathyroidism of renal origin: Secondary | ICD-10-CM | POA: Diagnosis not present

## 2016-06-16 DIAGNOSIS — N186 End stage renal disease: Secondary | ICD-10-CM | POA: Diagnosis not present

## 2016-06-19 DIAGNOSIS — D509 Iron deficiency anemia, unspecified: Secondary | ICD-10-CM | POA: Diagnosis not present

## 2016-06-19 DIAGNOSIS — D631 Anemia in chronic kidney disease: Secondary | ICD-10-CM | POA: Diagnosis not present

## 2016-06-19 DIAGNOSIS — E119 Type 2 diabetes mellitus without complications: Secondary | ICD-10-CM | POA: Diagnosis not present

## 2016-06-19 DIAGNOSIS — N2581 Secondary hyperparathyroidism of renal origin: Secondary | ICD-10-CM | POA: Diagnosis not present

## 2016-06-19 DIAGNOSIS — N186 End stage renal disease: Secondary | ICD-10-CM | POA: Diagnosis not present

## 2016-06-21 DIAGNOSIS — N186 End stage renal disease: Secondary | ICD-10-CM | POA: Diagnosis not present

## 2016-06-21 DIAGNOSIS — D509 Iron deficiency anemia, unspecified: Secondary | ICD-10-CM | POA: Diagnosis not present

## 2016-06-21 DIAGNOSIS — D631 Anemia in chronic kidney disease: Secondary | ICD-10-CM | POA: Diagnosis not present

## 2016-06-21 DIAGNOSIS — E119 Type 2 diabetes mellitus without complications: Secondary | ICD-10-CM | POA: Diagnosis not present

## 2016-06-21 DIAGNOSIS — N2581 Secondary hyperparathyroidism of renal origin: Secondary | ICD-10-CM | POA: Diagnosis not present

## 2016-06-23 DIAGNOSIS — E119 Type 2 diabetes mellitus without complications: Secondary | ICD-10-CM | POA: Diagnosis not present

## 2016-06-23 DIAGNOSIS — D631 Anemia in chronic kidney disease: Secondary | ICD-10-CM | POA: Diagnosis not present

## 2016-06-23 DIAGNOSIS — D509 Iron deficiency anemia, unspecified: Secondary | ICD-10-CM | POA: Diagnosis not present

## 2016-06-23 DIAGNOSIS — N186 End stage renal disease: Secondary | ICD-10-CM | POA: Diagnosis not present

## 2016-06-23 DIAGNOSIS — N2581 Secondary hyperparathyroidism of renal origin: Secondary | ICD-10-CM | POA: Diagnosis not present

## 2016-06-26 DIAGNOSIS — N2581 Secondary hyperparathyroidism of renal origin: Secondary | ICD-10-CM | POA: Diagnosis not present

## 2016-06-26 DIAGNOSIS — D509 Iron deficiency anemia, unspecified: Secondary | ICD-10-CM | POA: Diagnosis not present

## 2016-06-26 DIAGNOSIS — E119 Type 2 diabetes mellitus without complications: Secondary | ICD-10-CM | POA: Diagnosis not present

## 2016-06-26 DIAGNOSIS — D631 Anemia in chronic kidney disease: Secondary | ICD-10-CM | POA: Diagnosis not present

## 2016-06-26 DIAGNOSIS — N186 End stage renal disease: Secondary | ICD-10-CM | POA: Diagnosis not present

## 2016-06-28 DIAGNOSIS — N186 End stage renal disease: Secondary | ICD-10-CM | POA: Diagnosis not present

## 2016-06-28 DIAGNOSIS — N2581 Secondary hyperparathyroidism of renal origin: Secondary | ICD-10-CM | POA: Diagnosis not present

## 2016-06-28 DIAGNOSIS — E119 Type 2 diabetes mellitus without complications: Secondary | ICD-10-CM | POA: Diagnosis not present

## 2016-06-28 DIAGNOSIS — D509 Iron deficiency anemia, unspecified: Secondary | ICD-10-CM | POA: Diagnosis not present

## 2016-06-28 DIAGNOSIS — D631 Anemia in chronic kidney disease: Secondary | ICD-10-CM | POA: Diagnosis not present

## 2016-06-30 DIAGNOSIS — N186 End stage renal disease: Secondary | ICD-10-CM | POA: Diagnosis not present

## 2016-06-30 DIAGNOSIS — N2581 Secondary hyperparathyroidism of renal origin: Secondary | ICD-10-CM | POA: Diagnosis not present

## 2016-06-30 DIAGNOSIS — D509 Iron deficiency anemia, unspecified: Secondary | ICD-10-CM | POA: Diagnosis not present

## 2016-06-30 DIAGNOSIS — D631 Anemia in chronic kidney disease: Secondary | ICD-10-CM | POA: Diagnosis not present

## 2016-06-30 DIAGNOSIS — E119 Type 2 diabetes mellitus without complications: Secondary | ICD-10-CM | POA: Diagnosis not present

## 2016-07-03 DIAGNOSIS — N186 End stage renal disease: Secondary | ICD-10-CM | POA: Diagnosis not present

## 2016-07-03 DIAGNOSIS — N2581 Secondary hyperparathyroidism of renal origin: Secondary | ICD-10-CM | POA: Diagnosis not present

## 2016-07-03 DIAGNOSIS — D631 Anemia in chronic kidney disease: Secondary | ICD-10-CM | POA: Diagnosis not present

## 2016-07-03 DIAGNOSIS — E119 Type 2 diabetes mellitus without complications: Secondary | ICD-10-CM | POA: Diagnosis not present

## 2016-07-03 DIAGNOSIS — D509 Iron deficiency anemia, unspecified: Secondary | ICD-10-CM | POA: Diagnosis not present

## 2016-07-04 DIAGNOSIS — H401133 Primary open-angle glaucoma, bilateral, severe stage: Secondary | ICD-10-CM | POA: Diagnosis not present

## 2016-07-04 DIAGNOSIS — H4089 Other specified glaucoma: Secondary | ICD-10-CM | POA: Diagnosis not present

## 2016-07-05 DIAGNOSIS — E119 Type 2 diabetes mellitus without complications: Secondary | ICD-10-CM | POA: Diagnosis not present

## 2016-07-05 DIAGNOSIS — D509 Iron deficiency anemia, unspecified: Secondary | ICD-10-CM | POA: Diagnosis not present

## 2016-07-05 DIAGNOSIS — N2581 Secondary hyperparathyroidism of renal origin: Secondary | ICD-10-CM | POA: Diagnosis not present

## 2016-07-05 DIAGNOSIS — D631 Anemia in chronic kidney disease: Secondary | ICD-10-CM | POA: Diagnosis not present

## 2016-07-05 DIAGNOSIS — N186 End stage renal disease: Secondary | ICD-10-CM | POA: Diagnosis not present

## 2016-07-07 DIAGNOSIS — N186 End stage renal disease: Secondary | ICD-10-CM | POA: Diagnosis not present

## 2016-07-07 DIAGNOSIS — D509 Iron deficiency anemia, unspecified: Secondary | ICD-10-CM | POA: Diagnosis not present

## 2016-07-07 DIAGNOSIS — E119 Type 2 diabetes mellitus without complications: Secondary | ICD-10-CM | POA: Diagnosis not present

## 2016-07-07 DIAGNOSIS — N2581 Secondary hyperparathyroidism of renal origin: Secondary | ICD-10-CM | POA: Diagnosis not present

## 2016-07-07 DIAGNOSIS — D631 Anemia in chronic kidney disease: Secondary | ICD-10-CM | POA: Diagnosis not present

## 2016-07-08 DIAGNOSIS — E1129 Type 2 diabetes mellitus with other diabetic kidney complication: Secondary | ICD-10-CM | POA: Diagnosis not present

## 2016-07-08 DIAGNOSIS — N186 End stage renal disease: Secondary | ICD-10-CM | POA: Diagnosis not present

## 2016-07-08 DIAGNOSIS — Z992 Dependence on renal dialysis: Secondary | ICD-10-CM | POA: Diagnosis not present

## 2016-07-10 DIAGNOSIS — N186 End stage renal disease: Secondary | ICD-10-CM | POA: Diagnosis not present

## 2016-07-10 DIAGNOSIS — D631 Anemia in chronic kidney disease: Secondary | ICD-10-CM | POA: Diagnosis not present

## 2016-07-10 DIAGNOSIS — N2581 Secondary hyperparathyroidism of renal origin: Secondary | ICD-10-CM | POA: Diagnosis not present

## 2016-07-10 DIAGNOSIS — D509 Iron deficiency anemia, unspecified: Secondary | ICD-10-CM | POA: Diagnosis not present

## 2016-07-10 DIAGNOSIS — E119 Type 2 diabetes mellitus without complications: Secondary | ICD-10-CM | POA: Diagnosis not present

## 2016-07-12 DIAGNOSIS — N186 End stage renal disease: Secondary | ICD-10-CM | POA: Diagnosis not present

## 2016-07-12 DIAGNOSIS — E119 Type 2 diabetes mellitus without complications: Secondary | ICD-10-CM | POA: Diagnosis not present

## 2016-07-12 DIAGNOSIS — D509 Iron deficiency anemia, unspecified: Secondary | ICD-10-CM | POA: Diagnosis not present

## 2016-07-12 DIAGNOSIS — N2581 Secondary hyperparathyroidism of renal origin: Secondary | ICD-10-CM | POA: Diagnosis not present

## 2016-07-12 DIAGNOSIS — D631 Anemia in chronic kidney disease: Secondary | ICD-10-CM | POA: Diagnosis not present

## 2016-07-13 DIAGNOSIS — N186 End stage renal disease: Secondary | ICD-10-CM | POA: Diagnosis not present

## 2016-07-13 DIAGNOSIS — I871 Compression of vein: Secondary | ICD-10-CM | POA: Diagnosis not present

## 2016-07-13 DIAGNOSIS — T82858A Stenosis of vascular prosthetic devices, implants and grafts, initial encounter: Secondary | ICD-10-CM | POA: Diagnosis not present

## 2016-07-13 DIAGNOSIS — Z992 Dependence on renal dialysis: Secondary | ICD-10-CM | POA: Diagnosis not present

## 2016-07-14 ENCOUNTER — Other Ambulatory Visit: Payer: Self-pay | Admitting: *Deleted

## 2016-07-14 DIAGNOSIS — E119 Type 2 diabetes mellitus without complications: Secondary | ICD-10-CM | POA: Diagnosis not present

## 2016-07-14 DIAGNOSIS — N186 End stage renal disease: Secondary | ICD-10-CM | POA: Diagnosis not present

## 2016-07-14 DIAGNOSIS — D631 Anemia in chronic kidney disease: Secondary | ICD-10-CM | POA: Diagnosis not present

## 2016-07-14 DIAGNOSIS — N2581 Secondary hyperparathyroidism of renal origin: Secondary | ICD-10-CM | POA: Diagnosis not present

## 2016-07-14 DIAGNOSIS — D509 Iron deficiency anemia, unspecified: Secondary | ICD-10-CM | POA: Diagnosis not present

## 2016-07-14 DIAGNOSIS — R748 Abnormal levels of other serum enzymes: Secondary | ICD-10-CM

## 2016-07-17 DIAGNOSIS — D509 Iron deficiency anemia, unspecified: Secondary | ICD-10-CM | POA: Diagnosis not present

## 2016-07-17 DIAGNOSIS — E119 Type 2 diabetes mellitus without complications: Secondary | ICD-10-CM | POA: Diagnosis not present

## 2016-07-17 DIAGNOSIS — N186 End stage renal disease: Secondary | ICD-10-CM | POA: Diagnosis not present

## 2016-07-17 DIAGNOSIS — D631 Anemia in chronic kidney disease: Secondary | ICD-10-CM | POA: Diagnosis not present

## 2016-07-17 DIAGNOSIS — N2581 Secondary hyperparathyroidism of renal origin: Secondary | ICD-10-CM | POA: Diagnosis not present

## 2016-07-18 ENCOUNTER — Other Ambulatory Visit: Payer: Medicare Other

## 2016-07-18 DIAGNOSIS — R748 Abnormal levels of other serum enzymes: Secondary | ICD-10-CM

## 2016-07-18 LAB — HEPATIC FUNCTION PANEL
ALK PHOS: 110 IU/L (ref 39–117)
ALT: 9 IU/L (ref 0–32)
AST: 11 IU/L (ref 0–40)
Albumin: 3.6 g/dL (ref 3.5–4.8)
Bilirubin Total: 0.7 mg/dL (ref 0.0–1.2)
Bilirubin, Direct: 0.24 mg/dL (ref 0.00–0.40)
Total Protein: 6 g/dL (ref 6.0–8.5)

## 2016-07-19 DIAGNOSIS — D631 Anemia in chronic kidney disease: Secondary | ICD-10-CM | POA: Diagnosis not present

## 2016-07-19 DIAGNOSIS — N186 End stage renal disease: Secondary | ICD-10-CM | POA: Diagnosis not present

## 2016-07-19 DIAGNOSIS — N2581 Secondary hyperparathyroidism of renal origin: Secondary | ICD-10-CM | POA: Diagnosis not present

## 2016-07-19 DIAGNOSIS — E119 Type 2 diabetes mellitus without complications: Secondary | ICD-10-CM | POA: Diagnosis not present

## 2016-07-19 DIAGNOSIS — D509 Iron deficiency anemia, unspecified: Secondary | ICD-10-CM | POA: Diagnosis not present

## 2016-07-21 DIAGNOSIS — E119 Type 2 diabetes mellitus without complications: Secondary | ICD-10-CM | POA: Diagnosis not present

## 2016-07-21 DIAGNOSIS — N186 End stage renal disease: Secondary | ICD-10-CM | POA: Diagnosis not present

## 2016-07-21 DIAGNOSIS — D509 Iron deficiency anemia, unspecified: Secondary | ICD-10-CM | POA: Diagnosis not present

## 2016-07-21 DIAGNOSIS — D631 Anemia in chronic kidney disease: Secondary | ICD-10-CM | POA: Diagnosis not present

## 2016-07-21 DIAGNOSIS — N2581 Secondary hyperparathyroidism of renal origin: Secondary | ICD-10-CM | POA: Diagnosis not present

## 2016-07-23 ENCOUNTER — Emergency Department (HOSPITAL_COMMUNITY)
Admission: EM | Admit: 2016-07-23 | Discharge: 2016-07-23 | Disposition: A | Discharge status: Home / Self Care | Payer: Medicare Other | Attending: Emergency Medicine | Admitting: Emergency Medicine

## 2016-07-23 ENCOUNTER — Encounter (HOSPITAL_COMMUNITY): Payer: Self-pay | Admitting: Emergency Medicine

## 2016-07-23 ENCOUNTER — Emergency Department (HOSPITAL_COMMUNITY): Payer: Medicare Other

## 2016-07-23 DIAGNOSIS — E119 Type 2 diabetes mellitus without complications: Secondary | ICD-10-CM | POA: Diagnosis not present

## 2016-07-23 DIAGNOSIS — I2510 Atherosclerotic heart disease of native coronary artery without angina pectoris: Secondary | ICD-10-CM | POA: Insufficient documentation

## 2016-07-23 DIAGNOSIS — R110 Nausea: Secondary | ICD-10-CM | POA: Insufficient documentation

## 2016-07-23 DIAGNOSIS — Z79899 Other long term (current) drug therapy: Secondary | ICD-10-CM | POA: Insufficient documentation

## 2016-07-23 DIAGNOSIS — R609 Edema, unspecified: Secondary | ICD-10-CM | POA: Diagnosis not present

## 2016-07-23 DIAGNOSIS — Z7982 Long term (current) use of aspirin: Secondary | ICD-10-CM | POA: Insufficient documentation

## 2016-07-23 DIAGNOSIS — I132 Hypertensive heart and chronic kidney disease with heart failure and with stage 5 chronic kidney disease, or end stage renal disease: Secondary | ICD-10-CM | POA: Insufficient documentation

## 2016-07-23 DIAGNOSIS — R1114 Bilious vomiting: Secondary | ICD-10-CM | POA: Insufficient documentation

## 2016-07-23 DIAGNOSIS — I5032 Chronic diastolic (congestive) heart failure: Secondary | ICD-10-CM | POA: Insufficient documentation

## 2016-07-23 DIAGNOSIS — R2240 Localized swelling, mass and lump, unspecified lower limb: Secondary | ICD-10-CM | POA: Diagnosis present

## 2016-07-23 DIAGNOSIS — E8779 Other fluid overload: Secondary | ICD-10-CM | POA: Insufficient documentation

## 2016-07-23 DIAGNOSIS — N186 End stage renal disease: Secondary | ICD-10-CM | POA: Insufficient documentation

## 2016-07-23 DIAGNOSIS — E8770 Fluid overload, unspecified: Secondary | ICD-10-CM | POA: Diagnosis not present

## 2016-07-23 DIAGNOSIS — Z992 Dependence on renal dialysis: Secondary | ICD-10-CM | POA: Insufficient documentation

## 2016-07-23 DIAGNOSIS — Z91012 Allergy to eggs: Secondary | ICD-10-CM | POA: Insufficient documentation

## 2016-07-23 DIAGNOSIS — R112 Nausea with vomiting, unspecified: Secondary | ICD-10-CM | POA: Diagnosis not present

## 2016-07-23 DIAGNOSIS — R0989 Other specified symptoms and signs involving the circulatory and respiratory systems: Secondary | ICD-10-CM | POA: Diagnosis not present

## 2016-07-23 LAB — CBC WITH DIFFERENTIAL/PLATELET
Basophils Absolute: 0 10*3/uL (ref 0.0–0.1)
Basophils Relative: 0 %
EOS ABS: 0.1 10*3/uL (ref 0.0–0.7)
EOS PCT: 1 %
HCT: 39 % (ref 36.0–46.0)
HEMOGLOBIN: 12.3 g/dL (ref 12.0–15.0)
LYMPHS ABS: 0.6 10*3/uL — AB (ref 0.7–4.0)
Lymphocytes Relative: 8 %
MCH: 27.8 pg (ref 26.0–34.0)
MCHC: 31.5 g/dL (ref 30.0–36.0)
MCV: 88 fL (ref 78.0–100.0)
MONO ABS: 0.5 10*3/uL (ref 0.1–1.0)
MONOS PCT: 7 %
Neutro Abs: 6.3 10*3/uL (ref 1.7–7.7)
Neutrophils Relative %: 84 %
PLATELETS: 192 10*3/uL (ref 150–400)
RBC: 4.43 MIL/uL (ref 3.87–5.11)
RDW: 17 % — AB (ref 11.5–15.5)
WBC: 7.4 10*3/uL (ref 4.0–10.5)

## 2016-07-23 LAB — COMPREHENSIVE METABOLIC PANEL
ALBUMIN: 3.1 g/dL — AB (ref 3.5–5.0)
ALK PHOS: 114 U/L (ref 38–126)
ALT: 11 U/L — ABNORMAL LOW (ref 14–54)
AST: 17 U/L (ref 15–41)
Anion gap: 14 (ref 5–15)
BILIRUBIN TOTAL: 1.4 mg/dL — AB (ref 0.3–1.2)
BUN: 35 mg/dL — ABNORMAL HIGH (ref 6–20)
CALCIUM: 8.6 mg/dL — AB (ref 8.9–10.3)
CO2: 29 mmol/L (ref 22–32)
CREATININE: 4.71 mg/dL — AB (ref 0.44–1.00)
Chloride: 96 mmol/L — ABNORMAL LOW (ref 101–111)
GFR calc Af Amer: 10 mL/min — ABNORMAL LOW (ref >60)
GFR calc non Af Amer: 9 mL/min — ABNORMAL LOW (ref >60)
GLUCOSE: 182 mg/dL — AB (ref 65–99)
Potassium: 4.1 mmol/L (ref 3.5–5.1)
SODIUM: 139 mmol/L (ref 135–145)
TOTAL PROTEIN: 6.8 g/dL (ref 6.5–8.1)

## 2016-07-23 LAB — BRAIN NATRIURETIC PEPTIDE: B Natriuretic Peptide: >4500 pg/mL — ABNORMAL HIGH (ref 0.0–100.0)

## 2016-07-23 MED ORDER — GI COCKTAIL ~~LOC~~
30.0000 mL | Freq: Once | ORAL | Status: AC
  Administered 2016-07-23: 30 mL via ORAL
  Filled 2016-07-23: qty 30

## 2016-07-23 MED ORDER — FAMOTIDINE IN NACL 20-0.9 MG/50ML-% IV SOLN
20.0000 mg | Freq: Once | INTRAVENOUS | Status: AC
  Administered 2016-07-23: 20 mg via INTRAVENOUS
  Filled 2016-07-23: qty 50

## 2016-07-23 NOTE — ED Notes (Signed)
E-signature not available, pt and family verbalized understanding of DC instructions.

## 2016-07-23 NOTE — Discharge Instructions (Signed)
As discussed, it is important that you attend your dialysis session tomorrow. Please be sure to follow-up with your primary care physician as well. Return here for concerning changes in your condition.

## 2016-07-23 NOTE — ED Triage Notes (Addendum)
Patient arrived to ED from home via [facility]. EMS reports: Daughter requested EMS stating that patient experiencing pain in R lower leg/foot. Also, bilateral pitting edema noted. When EMS arrived, patient denied pain, and only complaint is nausea. No vomiting - just spit. Patient has GI appt on Tu.  Hx DM, CHF, Dialysis M, W, F. Had dialysis on Friday.  BP 122/70, Pulse 90, Resp 22, 95-98% on room air. CBG 157.

## 2016-07-23 NOTE — ED Notes (Signed)
Patient transported to X-ray 

## 2016-07-23 NOTE — ED Provider Notes (Signed)
[facility] DEPT Provider Note   CSN: [id number] Arrival date & time: 07/23/16  1436     History   Chief Complaint Chief Complaint  Patient presents with  . Leg Swelling  . Nausea    HPI [name] is a 71 y.o. female.  HPI  Patient presents concerned nausea, vomiting, possible hematemesis. Patient acknowledges multiple medical issues including CHF, end-stage renal disease. Last dialysis was 2 days ago, full session, no complication. Patient notes that for possibly 2 weeks she has had ongoing episodic nausea. Today, nausea was possibly more persistent, and she had several episodes of vomiting, including possible hematemesis. No sustained vomiting, no sustained nausea, no concurrent change in bowel movements.  No fever, no chills, no dyspnea. There is associated weakness.  No clear alleviating or exacerbating factors.  Past Medical History:  Diagnosis Date  . Anemia   . Arthritis    "hands" (11/17/2015)  . Atrial tachycardia ([place])    on amiod  . Cardiomyopathy- mixed   . CHF (congestive heart failure) ([place])   . Chronic diastolic heart failure ([place])   . Colon cancer ([place]) 1986  . Colostomy in place [facility])   . Coronary artery disease    Previously decreased EF; echo 113 normal LV function  . ESRD (end stage renal disease) on dialysis [facility])    Dr Dunham/Dr. Lyda Kalata.  M, W, Fr; [place] (11/17/2015)  . GERD (gastroesophageal reflux disease)   . Glaucoma   . Heart murmur   . Hernia, incisional    abd  . Hyperlipidemia   . Hypertension   . Hypertensive heart disease    sees Dr. Alain Marion  . LBBB (left bundle branch block)   . Mitral valve insufficiency and aortic valve insufficiency   . Obesity   . Stroke [facility])    2011/12  . Type II diabetes mellitus [facility])     Patient Active Problem List   Diagnosis Date Noted  . Hard of hearing 04/25/2016  . GERD (gastroesophageal reflux disease) 02/08/2016  . Loss of weight 01/18/2016  . Atrial tachycardia ([place])  11/17/2015  . Diabetes ([place]) 05/13/2015  . Acute upper respiratory infection 02/09/2015  . Ischemic cardiomyopathy   . Non-ST elevated myocardial infarction (non-STEMI) ([place])   . Shortness of breath 10/23/2014  . Elevated troponin 10/23/2014  . Congestive heart failure (CHF) ([place]) 10/23/2014  . Acute respiratory failure with hypoxia ([place]) 10/23/2014  . Acute encephalopathy 02/02/2013  . GI bleed 02/02/2013  . Chest pain 02/01/2013  . 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 History of colon cancer   . Obesity   . ESRD on dialysis ([place]) 05/28/2012  . Nausea 05/21/2011  . Anemia 05/21/2011  . Coronary artery disease   . CVA (cerebral infarction) 06/01/2010  . Atrial tachycardia    . LBBB (left bundle branch block) 04/10/2008  . Hyperlipidemia   . Hypertensive heart disease     Past Surgical History:  Procedure Laterality Date  . ARTERIOVENOUS GRAFT PLACEMENT Left 2010   "forearm"  . CARDIAC CATHETERIZATION    . CARDIAC CATHETERIZATION N/A 10/27/2014   Procedure: Left Heart Cath and Coronary Angiography;  Surgeon: Leonie Man, MD;  Location: [facility] [place] CV LAB;  Service: Cardiovascular;  Laterality: N/A;  . CARDIAC CATHETERIZATION N/A 10/28/2014   Procedure: Coronary Stent Intervention;  Surgeon: Peter M Martinique, MD;  Location: [place] CV LAB;  Service: Cardiovascular;  Laterality: N/A;  . CARDIAC CATHETERIZATION Bilateral 12/2008   R. heart cath showed  elevated left and right heart filling pressures w/ pulmonary artery pressure elevated mildly out of proportion to the wedge. The left heart cath showed diffuse distal vessel disease as well as a 75% stenosis in the mid circumflex w/ a 90% stenosis of the ostial first obtuse marginal. These lesions were in close proximity. there was a 60-70%mild RCA stenosis.   [name] CATARACT EXTRACTION W/ INTRAOCULAR LENS  IMPLANT, BILATERAL Bilateral   . COLON SURGERY    . COLONOSCOPY    . COLOSTOMY  1986  .  ELECTROCARDIOGRAM  04-27-06  . ELECTROPHYSIOLOGIC STUDY N/A 11/17/2015   Procedure: A-Tach Ablation;  Surgeon: Will Meredith Leeds, MD;  Location: [place] CV LAB;  Service: Cardiovascular;  Laterality: N/A;  . ESOPHAGOGASTRODUODENOSCOPY  03-18-04  . ESOPHAGOGASTRODUODENOSCOPY N/A 02/02/2013   Procedure: ESOPHAGOGASTRODUODENOSCOPY (EGD);  Surgeon: Irene Shipper, MD;  [facility] ENDOSCOPY;  Service: Endoscopy;  Laterality: N/A;  . EYE SURGERY    . FOOT AMPUTATION THROUGH METATARSAL  10-07-10   Right foot transmetatarsal  . INSERTION OF AHMED VALVE Right 08/20/2013   Procedure: INSERTION OF AHMED VALVE WITH Mitomycin C application;  Surgeon: Marylynn Pearson, MD;  Location: [place];  Service: Ophthalmology;  Laterality: Right;  . INSERTION OF AHMED VALVE Left 07/22/2014   Procedure: INSERTION OF AHMED VALVE WITH [place];  Surgeon: Marylynn Pearson, MD;  Location: [place];  Service: Ophthalmology;  Laterality: Left;  . MITOMYCIN C APPLICATION Left 3/61/4431   Procedure: MITOMYCIN C APPLICATION;  Surgeon: Marylynn Pearson, MD;  Location: [place];  Service: Ophthalmology;  Laterality: Left;  . PARS PLANA VITRECTOMY  11/29/2011   Procedure: PARS PLANA VITRECTOMY WITH 23 GAUGE;  Surgeon: Adonis Brook, MD;  Location: [facility];  Service: Ophthalmology;  Laterality: Right;  Right Eye 23 ga vitrectomy with membrane peel  . PARS PLANA VITRECTOMY Left 02/28/2012   Procedure: PARS PLANA VITRECTOMY WITH 23 GAUGE;  Surgeon: Adonis Brook, MD;  Location: [place];  Service: Ophthalmology;  Laterality: Left;  . REVISION UROSTOMY CUTANEOUS    . SUPRAVENTRICULAR TACHYCARDIA ABLATION  11/17/2015  . VAGINAL HYSTERECTOMY      OB History    No data available       Home Medications    Prior to Admission medications   Medication Sig Start Date End Date Taking? Authorizing Provider  acetaminophen (TYLENOL) 500 MG tablet Take 500 mg by mouth every 6 (six) hours as needed (pain).   Yes [provider]  amiodarone  (PACERONE) 200 MG tablet Take 1 tablet (200 mg total) by mouth daily. 08/10/15  Yes Richardson Dopp T, PA-C  aspirin 81 MG tablet Take 1 tablet (81 mg total) by mouth daily. 10/29/14  Yes Dhungel, Nishant, MD  BRILINTA 90 MG TABS tablet TAKE 1 TABLET BY MOUTH TWICE DAILY 02/19/15  Yes Deboraha Sprang, MD  calcium acetate (PHOSLO) 667 MG capsule Take 1,334-2,001 mg by mouth See admin instructions. Take 2001mg  three times daily with meals and Take 1334mg   with snacks.   Yes [provider]  dorzolamide-timolol (COSOPT) 22.3-6.8 MG/ML ophthalmic solution Place 1 drop into the left eye 2 (two) times daily. 11/09/15  Yes [provider]  Fluticasone-Salmeterol (ADVAIR) 250-50 MCG/DOSE AEPB Inhale 1 puff into the lungs daily as needed (shortness of breath).  05/28/12  Yes Plotnikov, Evie Lacks, MD  metoprolol (LOPRESSOR) 50 MG tablet Take 1 tablet (50 mg total) by mouth 2 (two) times daily. Do not take this medication on dialysis days (Monday, Wednesday, Friday). Patient taking differently:  Take 50 mg by mouth daily. Do not take this medication on dialysis days (Monday, Wednesday, Friday). 01/18/16  Yes Plotnikov, Evie Lacks, MD  nitroGLYCERIN (NITROSTAT) 0.4 MG SL tablet Place 1 tablet (0.4 mg total) under the tongue every 5 (five) minutes as needed for chest pain. 10/29/14  Yes Dhungel, Nishant, MD  repaglinide (PRANDIN) 0.5 MG tablet TAKE 1 TABLET BY MOUTH THREE TIMES DAILY BEFORE MEALS 03/16/16  Yes Renato Shin, MD  simvastatin (ZOCOR) 10 MG tablet Take 1 tablet (10 mg total) by mouth every evening. 05/23/16  Yes Camnitz, Will Hassell Done, MD  famotidine (PEPCID) 20 MG tablet Take 1 tablet (20 mg total) by mouth 2 (two) times daily. Patient not taking: Reported on 07/23/2016 04/25/16   Plotnikov, Evie Lacks, MD  glucose blood (ONE TOUCH ULTRA TEST) test strip 1 each by Other route daily. And lancets 1/day 03/16/16   Renato Shin, MD    Family History Family History  Problem Relation Age of Onset  .  Hypertension Mother   . Coronary artery disease Mother   . Hypertension Father   . Diabetes Father   . Cancer Sister        colon  . Hypertension Other     Social History Social History  Substance Use Topics  . Smoking status: Never Smoker  . Smokeless tobacco: Never Used  . Alcohol use No     Allergies   Ace inhibitors; Eggs or egg-derived products; Enalapril; Lisinopril; and Omnipaque [iohexol]   Review of Systems Review of Systems  Constitutional:       Per HPI, otherwise negative  HENT:       Per HPI, otherwise negative  Respiratory:       Per HPI, otherwise negative  Cardiovascular:       Per HPI, otherwise negative  Gastrointestinal: Positive for nausea and vomiting. Negative for abdominal pain.  Endocrine:       Negative aside from HPI  Genitourinary:       Neg aside from HPI   Musculoskeletal:       Colostomy bag unremarkable Ileostomy bag, unremarkable patient notes no changes in output of either of these in a long time.  Skin: Negative.   Allergic/Immunologic: Positive for immunocompromised state.  Neurological: Negative for syncope.     Physical Exam Updated Vital Signs BP 106/80   Pulse 89   Temp (!) 97.5 F (36.4 C) (Oral)   Resp 14   Ht 5' (1.524 m)   Wt 81.2 kg (179 lb)   SpO2 93%   BMI 34.96 kg/m   Physical Exam  Constitutional: She is oriented to person, place, and time. She has a sickly appearance. No distress.  HENT:  Head: Normocephalic and atraumatic.  Eyes: Conjunctivae and EOM are normal.  Cardiovascular: Normal rate and regular rhythm.   Pulmonary/Chest: Effort normal and breath sounds normal. No stridor. No respiratory distress.  Abdominal: She exhibits no distension.    Musculoskeletal: She exhibits no edema.       Feet:  Neurological: She is alert and oriented to person, place, and time. No cranial nerve deficit.  Skin: Skin is warm and dry.  Psychiatric: She has a normal mood and affect.  Nursing note and vitals  reviewed.    ED Treatments / Results  Labs (all labs ordered are listed, but only abnormal results are displayed) Labs Reviewed  COMPREHENSIVE METABOLIC PANEL - Abnormal; Notable for the following:       Result Value   Chloride 96 (*)  Glucose, Bld 182 (*)    BUN 35 (*)    Creatinine, Ser 4.71 (*)    Calcium 8.6 (*)    Albumin 3.1 (*)    ALT 11 (*)    Total Bilirubin 1.4 (*)    GFR calc non Af Amer 9 (*)    GFR calc Af Amer 10 (*)    All other components within normal limits  BRAIN NATRIURETIC PEPTIDE - Abnormal; Notable for the following:    B Natriuretic Peptide >4,500.0 (*)    All other components within normal limits  CBC WITH DIFFERENTIAL/PLATELET - Abnormal; Notable for the following:    RDW 17.0 (*)    Lymphs Abs 0.6 (*)    All other components within normal limits    EKG  EKG Interpretation  Date/Time:  Sunday July 23 2016 15:52:44 EDT Ventricular Rate:  92 PR Interval:    QRS Duration: 167 QT Interval:  424 QTC Calculation: 525 R Axis:   159 Text Interpretation:  Right and left arm electrode reversal, interpretation assumes no reversal Sinus rhythm Ventricular premature complex Borderline prolonged PR interval ST-t wave abnormality Non-specific intra-ventricular conduction delay No significant change since last tracing Abnormal ekg Confirmed by Carmin Muskrat [id number]) on 07/23/2016 4:02:33 PM       Radiology Dg Chest 2 View  Result Date: 07/23/2016 CLINICAL DATA:  Decreased breath sounds and nausea and vomiting EXAM: CHEST  2 VIEW COMPARISON:  05/16/2016 FINDINGS: Cardiac shadow remains enlarged. Aortic calcifications are again seen. The lungs are well aerated bilaterally. No focal infiltrate or sizable effusion is seen. No acute bony abnormality is noted. IMPRESSION: Stable appearance of the chest.  No acute abnormality is noted. Electronically Signed   By: Inez Catalina M.D.   On: 07/23/2016 16:23    Procedures Procedures (including critical care  time)  Medications Ordered in ED Medications  famotidine (PEPCID) IVPB 20 mg premix (0 mg Intravenous Stopped 07/23/16 1730)  gi cocktail (Maalox,Lidocaine,Donnatal) (30 mLs Oral Given 07/23/16 1651)   Chart review notable for multiple medical issues including congestive heart failure. Last echocardiogram was one year ago, results below  Left ventricle:  The cavity size was normal. Wall thickness was increased in a pattern of moderate LVH. Systolic function was severely reduced. The estimated ejection fraction was in the range of 25% to 30%. Diffuse hypokinesis.   Initial Impression / Assessment and Plan / ED Course  I have reviewed the triage vital signs and the nursing notes.  Pertinent labs & imaging results that were available during my care of the patient were reviewed by me and considered in my medical decision making (see chart for details).  Patient with multiple medical issues presents with concern for ongoing nausea, occasional vomiting, fatigue. Here she is awake, alert, afebrile, in no distress, with soft, non-peritoneal abdomen. Patient has both a colostomy and ileostomy, with continued, and given the absence of abdominal pain, there is low suspicion for obstruction, no evidence for peritonitis. Exam was consistent with fluid overload status. Patient scheduled for dialysis tomorrow, was discharged in stable condition to proceed to the appointment.  Final Clinical Impressions(s) / ED Diagnoses   Final diagnoses:  Bilious vomiting with nausea  Other hypervolemia     Carmin Muskrat, MD 07/23/16 [phone]

## 2016-07-24 DIAGNOSIS — D509 Iron deficiency anemia, unspecified: Secondary | ICD-10-CM | POA: Diagnosis not present

## 2016-07-24 DIAGNOSIS — N186 End stage renal disease: Secondary | ICD-10-CM | POA: Diagnosis not present

## 2016-07-24 DIAGNOSIS — N2581 Secondary hyperparathyroidism of renal origin: Secondary | ICD-10-CM | POA: Diagnosis not present

## 2016-07-24 DIAGNOSIS — D631 Anemia in chronic kidney disease: Secondary | ICD-10-CM | POA: Diagnosis not present

## 2016-07-24 DIAGNOSIS — E119 Type 2 diabetes mellitus without complications: Secondary | ICD-10-CM | POA: Diagnosis not present

## 2016-07-25 ENCOUNTER — Encounter: Payer: Self-pay | Admitting: Internal Medicine

## 2016-07-25 ENCOUNTER — Other Ambulatory Visit: Payer: Medicare Other

## 2016-07-25 ENCOUNTER — Ambulatory Visit (INDEPENDENT_AMBULATORY_CARE_PROVIDER_SITE_OTHER): Payer: Medicare Other | Admitting: Internal Medicine

## 2016-07-25 ENCOUNTER — Telehealth: Payer: Self-pay

## 2016-07-25 VITALS — BP 100/54 | HR 64 | Ht 60.0 in | Wt 182.0 lb

## 2016-07-25 DIAGNOSIS — K920 Hematemesis: Secondary | ICD-10-CM

## 2016-07-25 DIAGNOSIS — Z7902 Long term (current) use of antithrombotics/antiplatelets: Secondary | ICD-10-CM | POA: Diagnosis not present

## 2016-07-25 DIAGNOSIS — I2510 Atherosclerotic heart disease of native coronary artery without angina pectoris: Secondary | ICD-10-CM | POA: Diagnosis not present

## 2016-07-25 DIAGNOSIS — I255 Ischemic cardiomyopathy: Secondary | ICD-10-CM

## 2016-07-25 DIAGNOSIS — Z9861 Coronary angioplasty status: Secondary | ICD-10-CM | POA: Diagnosis not present

## 2016-07-25 DIAGNOSIS — R1310 Dysphagia, unspecified: Secondary | ICD-10-CM

## 2016-07-25 DIAGNOSIS — R1319 Other dysphagia: Secondary | ICD-10-CM

## 2016-07-25 MED ORDER — ONDANSETRON HCL 4 MG PO TABS: 4.0000 mg | ORAL_TABLET | Freq: Three times a day (TID) | ORAL | 0 refills | Status: DC | PRN

## 2016-07-25 MED ORDER — PANTOPRAZOLE SODIUM 40 MG PO TBEC: 40.0000 mg | DELAYED_RELEASE_TABLET | Freq: Every day | ORAL | 11 refills | Status: DC

## 2016-07-25 NOTE — Patient Instructions (Addendum)
  We have sent the following medications to your pharmacy for you to pick up at your convenience: Pantoprazole and zofran   Stop your alka seltzer per Dr Carlean Purl.   You have been scheduled for an endoscopy. Please follow written instructions given to you at your visit today. If you use inhalers (even only as needed), please bring them with you on the day of your procedure.   Hold your Prandin the AM of your procedure.   We will contact you about holding your Brilinta.   I appreciate the opportunity to care for you. [name], MD , [facility]

## 2016-07-25 NOTE — Telephone Encounter (Deleted)
[facility] [address] [place] [address]  MRN: [id number]   Dear Virl Axe,    We have scheduled the above patient for an endoscopic procedure. Our records show that she is on anticoagulation therapy.   Please advise as to how long the patient may come off her therapy of Brilinta prior to the procedure, which is scheduled for EGD.  Please fax back/ or route the completed form to Pranit Owensby [place], [place] at [phone].   Sincerely,    [name], MD, [facility]

## 2016-07-25 NOTE — Progress Notes (Signed)
[name] 71 y.o. 13-Sep-1945 [id number]  Assessment & Plan:   Encounter Diagnoses  Name Primary?  . Hematemesis with nausea Yes  . Esophageal dysphagia   . Encounter for long-term use of antiplatelets/antithrombotics - ticagrelor   . CAD S/P percutaneous coronary angioplasty     Scheduled endoscopy 08/17/2016 to be performed at [facility].  Necessary to visualize possible source of bleeding and possibly perform dilation (Baloon vs Venia Minks) to treat dysphagia. We will communicate with cardiology for confirmation as to when to d/c and restart Brilinta.  Prescriptions sent to begin Pantoprazole for acid control and indigestion symptoms.  Zofran prescription sent for nausea and vomiting.  D/C use of alka-seltzer  It is possible this increase in vomiting is 2ndary to lack of PPI therapy with a history of gastritis and esophagitis. We will review with cardiology to manage  to ensure proper medication management around the time of the endoscopy.  I have personally seen the patient, reviewed and repeated key elements of the history and physical and participated in formation of the assessment and plan the student has documented.  The risks and benefits as well as alternatives of endoscopic procedure(s) have been discussed and reviewed. All questions answered. The patient agrees to proceed.  Gatha Mayer, MD, [facility]   Subjective:   Chief Complaint: Intermittent vomiting with some blood  HPI  [name] is a pleasant elderly female accompanied by her daughter today for evaluation of vomiting with a recent episode of having some hematemesis .  She has PMH of DM, ESRD, Atrial Tachycardia, and CHF.  She is on HD 3 days/ week.  She admits to having mild dysphagia with solid foods but liquids are ok.  She has mild indigestion and has been taking Alka-Seltzer to no relief.  Her last Upper Endoscopy was in 2015 with esophagitis and gastritis diagnosed.  Currently she is not  taking a PPI to control acid. She denies chest pain, palpitations, diarrhea, constipation hematochezia .  Allergies  Allergen Reactions  . Ace Inhibitors Cough  . Eggs Or Egg-Derived Products Nausea And Vomiting  . Enalapril Cough  . Lisinopril Cough  . Omnipaque [Iohexol] Hives   Current Meds  Medication Sig  . acetaminophen (TYLENOL) 500 MG tablet Take 500 mg by mouth every 6 (six) hours as needed (pain).  [name] amiodarone (PACERONE) 200 MG tablet Take 1 tablet (200 mg total) by mouth daily.  [name] aspirin 81 MG tablet Take 1 tablet (81 mg total) by mouth daily.  [name] BRILINTA 90 MG TABS tablet TAKE 1 TABLET BY MOUTH TWICE DAILY  . calcium acetate (PHOSLO) 667 MG capsule Take 1,334-2,001 mg by mouth See admin instructions. Take 2001mg  three times daily with meals and Take 1334mg   with snacks.  . dorzolamide-timolol (COSOPT) 22.3-6.8 MG/ML ophthalmic solution Place 1 drop into the left eye 2 (two) times daily.  . Fluticasone-Salmeterol (ADVAIR) 250-50 MCG/DOSE AEPB Inhale 1 puff into the lungs daily as needed (shortness of breath).   [name] glucose blood (ONE TOUCH ULTRA TEST) test strip 1 each by Other route daily. And lancets 1/day  . metoprolol (LOPRESSOR) 50 MG tablet Take 1 tablet (50 mg total) by mouth 2 (two) times daily. Do not take this medication on dialysis days (Monday, Wednesday, Friday). (Patient taking differently: Take 50 mg by mouth daily. Do not take this medication on dialysis days (Monday, Wednesday, Friday).)  . nitroGLYCERIN (NITROSTAT) 0.4 MG SL tablet Place 1 tablet (0.4 mg total) under the tongue every  5 (five) minutes as needed for chest pain.  . repaglinide (PRANDIN) 0.5 MG tablet TAKE 1 TABLET BY MOUTH THREE TIMES DAILY BEFORE MEALS  . simvastatin (ZOCOR) 10 MG tablet Take 1 tablet (10 mg total) by mouth every evening.   Past Medical History:  Diagnosis Date  . Acute respiratory failure with hypoxia ([place]) 10/23/2014  . Anemia   . Arthritis    "hands" (11/17/2015)  . Atrial  tachycardia ([place])    on amiod  . Cardiomyopathy- mixed   . CHF (congestive heart failure) ([place])   . Chronic diastolic heart failure ([place])   . Colon cancer ([place]) 1986  . Colostomy in place [facility])   . Coronary artery disease    Previously decreased EF; echo 113 normal LV function  . ESRD (end stage renal disease) on dialysis [facility])    Dr Dunham/Dr. Lyda Kalata.  M, W, Fr; [place] (11/17/2015)  . GERD (gastroesophageal reflux disease)   . Glaucoma   . Heart murmur   . Hernia, incisional    abd  . Hyperlipidemia   . Hypertension   . Hypertensive heart disease    sees Dr. Alain Marion  . LBBB (left bundle branch block)   . Mitral valve insufficiency and aortic valve insufficiency   . Obesity   . Stroke [facility])    2011/12  . Type II diabetes mellitus ([place])    Past Surgical History:  Procedure Laterality Date  . ARTERIOVENOUS GRAFT PLACEMENT Left 2010   "forearm"  . CARDIAC CATHETERIZATION    . CARDIAC CATHETERIZATION N/A 10/27/2014   Procedure: Left Heart Cath and Coronary Angiography;  Surgeon: Leonie Man, MD;  Location: [place] CV LAB;  Service: Cardiovascular;  Laterality: N/A;  . CARDIAC CATHETERIZATION N/A 10/28/2014   Procedure: Coronary Stent Intervention;  Surgeon: Peter M Martinique, MD;  Location: [place] CV LAB;  Service: Cardiovascular;  Laterality: N/A;  . CARDIAC CATHETERIZATION Bilateral 12/2008   R. heart cath showed elevated left and right heart filling pressures w/ pulmonary artery pressure elevated mildly out of proportion to the wedge. The left heart cath showed diffuse distal vessel disease as well as a 75% stenosis in the mid circumflex w/ a 90% stenosis of the ostial first obtuse marginal. These lesions were in close proximity. there was a 60-70%mild RCA stenosis.   [name] CATARACT EXTRACTION W/ INTRAOCULAR LENS  IMPLANT, BILATERAL Bilateral   . COLON SURGERY    . COLONOSCOPY    . COLOSTOMY  1986  . ELECTROCARDIOGRAM  04-27-06  . ELECTROPHYSIOLOGIC STUDY N/A  11/17/2015   Procedure: A-Tach Ablation;  Surgeon: Will Meredith Leeds, MD;  Location: [place] CV LAB;  Service: Cardiovascular;  Laterality: N/A;  . ESOPHAGOGASTRODUODENOSCOPY  03-18-04  . ESOPHAGOGASTRODUODENOSCOPY N/A 02/02/2013   Procedure: ESOPHAGOGASTRODUODENOSCOPY (EGD);  Surgeon: Irene Shipper, MD;  Location: [facility] ENDOSCOPY;  Service: Endoscopy;  Laterality: N/A;  . EYE SURGERY    . FOOT AMPUTATION THROUGH METATARSAL  10-07-10   Right foot transmetatarsal  . INSERTION OF AHMED VALVE Right 08/20/2013   Procedure: INSERTION OF AHMED VALVE WITH Mitomycin C application;  Surgeon: Marylynn Pearson, MD;  Location: [place];  Service: Ophthalmology;  Laterality: Right;  . INSERTION OF AHMED VALVE Left 07/22/2014   Procedure: INSERTION OF AHMED VALVE WITH [place];  Surgeon: Marylynn Pearson, MD;  Location: [place];  Service: Ophthalmology;  Laterality: Left;  . MITOMYCIN C APPLICATION Left 2/70/3500   Procedure: MITOMYCIN C APPLICATION;  Surgeon: Marylynn Pearson, MD;  Location: [place];  Service: Ophthalmology;  Laterality: Left;  . PARS PLANA VITRECTOMY  11/29/2011   Procedure: PARS PLANA VITRECTOMY WITH 23 GAUGE;  Surgeon: Adonis Brook, MD;  Location: [place];  Service: Ophthalmology;  Laterality: Right;  Right Eye 23 ga vitrectomy with membrane peel  . PARS PLANA VITRECTOMY Left 02/28/2012   Procedure: PARS PLANA VITRECTOMY WITH 23 GAUGE;  Surgeon: Adonis Brook, MD;  Location: [place];  Service: Ophthalmology;  Laterality: Left;  . REVISION UROSTOMY CUTANEOUS    . SUPRAVENTRICULAR TACHYCARDIA ABLATION  11/17/2015  . VAGINAL HYSTERECTOMY     Social History   Social History  . Marital status: Widowed   Social History Main Topics  . Smoking status: Never Smoker  . Smokeless tobacco: Never Used  . Alcohol use No  . Drug use: No  . Sexual activity: No    family history includes Cancer in her sister; Coronary artery disease in her mother; Diabetes in her father; Hypertension in her father, mother, and  other.   Review of Systems CONST: denies weight loss, fatigue, night sweats GI- positive for nausea and vomiting with hemtaemesis. Positive dysphagia. Denies Constipation or Diarrhea. Denies hematochezia     See HPI  Objective:   Physical Exam @BP  (!) 100/54   Pulse 64   Ht 5' (1.524 m)   Wt 182 lb (82.6 kg)   BMI 35.54 kg/m @  General:  Well-developed, well-nourished and in no acute distress Eyes:  anicteric. ENT:   Mouth and posterior pharynx free of lesions.  Neck:   supple w/o thyromegaly or mass.  Lungs: Clear to auscultation bilaterally. Heart:   S1S2, no rubs, murmurs, gallops. Abdomen:  soft, non-tender, no  hepatosplenomegaly, hernia, or mass and BS+.  Colostomy bag intact with small amount of soft,  brown stool.  Urostomy bag clean intact. Rectal: Not perfromed Lymph:  no cervical or supraclavicular adenopathy. Extremities:   2+ edema in lower extremities to knee, no  cyanosis or clubbing LUE AV fistula Skin   no rash. Neuro:  A&O x 3.  Psych:  appropriate mood and  Affect.   Data Reviewed:  CBC Latest Ref Rng & Units 07/23/2016 05/16/2016 01/18/2016  WBC 4.0 - 10.5 K/uL 7.4 6.0 4.7  Hemoglobin 12.0 - 15.0 g/dL 12.3 13.1 11.5(L)  Hematocrit 36.0 - 46.0 % 39.0 42.0 35.7(L)  Platelets 150 - 400 K/uL 192 290 183.0   Lab Results  Component Value Date   CREATININE 4.71 (H) 07/23/2016   CREATININE 4.22 (H) 05/16/2016   CREATININE 4.15 (H) 01/18/2016   Lab Results  Component Value Date   POCGLU 191 06/15/2010    Prior colonoscopy 2006 - negative for neoplasia but hernia and colon stricture - subsequently has BE's 2011 and 2012 not a candidate for colonoscopy due to abnormal colon anatomy and hernia.    Edward Qualia, [place]

## 2016-07-25 NOTE — Telephone Encounter (Signed)
[facility] [address]  [place] [address]  MRN: [id number]   Dear Virl Axe,    We have scheduled the above patient for an endoscopic procedure. Our records show that she is on anticoagulation therapy.   Please advise as to how long the patient may come off her therapy of Brilinta prior to the EGD procedure, which is scheduled for 08/17/16.  Please fax back/ or route the completed form to Christy Ehrsam Martinique, [place] at [phone].   Sincerely,    [name], MD, [facility]

## 2016-07-26 DIAGNOSIS — N2581 Secondary hyperparathyroidism of renal origin: Secondary | ICD-10-CM | POA: Diagnosis not present

## 2016-07-26 DIAGNOSIS — E119 Type 2 diabetes mellitus without complications: Secondary | ICD-10-CM | POA: Diagnosis not present

## 2016-07-26 DIAGNOSIS — D631 Anemia in chronic kidney disease: Secondary | ICD-10-CM | POA: Diagnosis not present

## 2016-07-26 DIAGNOSIS — D509 Iron deficiency anemia, unspecified: Secondary | ICD-10-CM | POA: Diagnosis not present

## 2016-07-26 DIAGNOSIS — N186 End stage renal disease: Secondary | ICD-10-CM | POA: Diagnosis not present

## 2016-07-28 DIAGNOSIS — D509 Iron deficiency anemia, unspecified: Secondary | ICD-10-CM | POA: Diagnosis not present

## 2016-07-28 DIAGNOSIS — D631 Anemia in chronic kidney disease: Secondary | ICD-10-CM | POA: Diagnosis not present

## 2016-07-28 DIAGNOSIS — E119 Type 2 diabetes mellitus without complications: Secondary | ICD-10-CM | POA: Diagnosis not present

## 2016-07-28 DIAGNOSIS — N186 End stage renal disease: Secondary | ICD-10-CM | POA: Diagnosis not present

## 2016-07-28 DIAGNOSIS — N2581 Secondary hyperparathyroidism of renal origin: Secondary | ICD-10-CM | POA: Diagnosis not present

## 2016-07-31 DIAGNOSIS — D509 Iron deficiency anemia, unspecified: Secondary | ICD-10-CM | POA: Diagnosis not present

## 2016-07-31 DIAGNOSIS — D631 Anemia in chronic kidney disease: Secondary | ICD-10-CM | POA: Diagnosis not present

## 2016-07-31 DIAGNOSIS — N2581 Secondary hyperparathyroidism of renal origin: Secondary | ICD-10-CM | POA: Diagnosis not present

## 2016-07-31 DIAGNOSIS — N186 End stage renal disease: Secondary | ICD-10-CM | POA: Diagnosis not present

## 2016-07-31 DIAGNOSIS — E119 Type 2 diabetes mellitus without complications: Secondary | ICD-10-CM | POA: Diagnosis not present

## 2016-08-02 DIAGNOSIS — E119 Type 2 diabetes mellitus without complications: Secondary | ICD-10-CM | POA: Diagnosis not present

## 2016-08-02 DIAGNOSIS — N186 End stage renal disease: Secondary | ICD-10-CM | POA: Diagnosis not present

## 2016-08-02 DIAGNOSIS — N2581 Secondary hyperparathyroidism of renal origin: Secondary | ICD-10-CM | POA: Diagnosis not present

## 2016-08-02 DIAGNOSIS — E1129 Type 2 diabetes mellitus with other diabetic kidney complication: Secondary | ICD-10-CM | POA: Diagnosis not present

## 2016-08-02 DIAGNOSIS — D509 Iron deficiency anemia, unspecified: Secondary | ICD-10-CM | POA: Diagnosis not present

## 2016-08-02 DIAGNOSIS — D631 Anemia in chronic kidney disease: Secondary | ICD-10-CM | POA: Diagnosis not present

## 2016-08-03 NOTE — Telephone Encounter (Signed)
I'm faxing the anti-coag letter to Dr Caryl Comes in case he didn't get it thru the system.

## 2016-08-04 DIAGNOSIS — D631 Anemia in chronic kidney disease: Secondary | ICD-10-CM | POA: Diagnosis not present

## 2016-08-04 DIAGNOSIS — E119 Type 2 diabetes mellitus without complications: Secondary | ICD-10-CM | POA: Diagnosis not present

## 2016-08-04 DIAGNOSIS — N186 End stage renal disease: Secondary | ICD-10-CM | POA: Diagnosis not present

## 2016-08-04 DIAGNOSIS — D509 Iron deficiency anemia, unspecified: Secondary | ICD-10-CM | POA: Diagnosis not present

## 2016-08-04 DIAGNOSIS — N2581 Secondary hyperparathyroidism of renal origin: Secondary | ICD-10-CM | POA: Diagnosis not present

## 2016-08-07 DIAGNOSIS — D509 Iron deficiency anemia, unspecified: Secondary | ICD-10-CM | POA: Diagnosis not present

## 2016-08-07 DIAGNOSIS — N2581 Secondary hyperparathyroidism of renal origin: Secondary | ICD-10-CM | POA: Diagnosis not present

## 2016-08-07 DIAGNOSIS — E119 Type 2 diabetes mellitus without complications: Secondary | ICD-10-CM | POA: Diagnosis not present

## 2016-08-07 DIAGNOSIS — D631 Anemia in chronic kidney disease: Secondary | ICD-10-CM | POA: Diagnosis not present

## 2016-08-07 DIAGNOSIS — N186 End stage renal disease: Secondary | ICD-10-CM | POA: Diagnosis not present

## 2016-08-07 NOTE — Telephone Encounter (Signed)
Follow Up:   Checking the status of the clearance she sent over.

## 2016-08-08 DIAGNOSIS — Z992 Dependence on renal dialysis: Secondary | ICD-10-CM | POA: Diagnosis not present

## 2016-08-08 DIAGNOSIS — E1129 Type 2 diabetes mellitus with other diabetic kidney complication: Secondary | ICD-10-CM | POA: Diagnosis not present

## 2016-08-08 DIAGNOSIS — N186 End stage renal disease: Secondary | ICD-10-CM | POA: Diagnosis not present

## 2016-08-08 NOTE — Telephone Encounter (Signed)
Non-stemi w/ PCI- 10/28/14- started on Brilinta.  To Dr. Caryl Comes to review.

## 2016-08-09 ENCOUNTER — Telehealth: Payer: Self-pay | Admitting: Internal Medicine

## 2016-08-09 DIAGNOSIS — N186 End stage renal disease: Secondary | ICD-10-CM | POA: Diagnosis not present

## 2016-08-09 DIAGNOSIS — N2581 Secondary hyperparathyroidism of renal origin: Secondary | ICD-10-CM | POA: Diagnosis not present

## 2016-08-09 DIAGNOSIS — Z23 Encounter for immunization: Secondary | ICD-10-CM | POA: Diagnosis not present

## 2016-08-09 DIAGNOSIS — D631 Anemia in chronic kidney disease: Secondary | ICD-10-CM | POA: Diagnosis not present

## 2016-08-09 DIAGNOSIS — E119 Type 2 diabetes mellitus without complications: Secondary | ICD-10-CM | POA: Diagnosis not present

## 2016-08-09 DIAGNOSIS — D509 Iron deficiency anemia, unspecified: Secondary | ICD-10-CM | POA: Diagnosis not present

## 2016-08-09 NOTE — Telephone Encounter (Signed)
Previous encounter already open for the same thing- waiting for MD to review.  Will close this encounter.

## 2016-08-09 NOTE — Telephone Encounter (Signed)
New message          [facility] Pre-operative Risk Assessment    Request for surgical clearance:  What type of surgery is being performed?  EGD at [facility] long 1. When is this surgery scheduled? 08-18-15  Are there any medications that need to be held prior to surgery and how long? Hold brilinta prior to procedure 2. Name of physician performing surgery?  Dr [name]  What is your office phone and fax number?  Send thru epic (Patti Martinique) Earnestine Mealing 08/09/2016, 4:11 PM  _________________________________________________________________   (provider comments below)

## 2016-08-10 ENCOUNTER — Telehealth: Payer: Self-pay | Admitting: Internal Medicine

## 2016-08-10 NOTE — Telephone Encounter (Signed)
She was supposed to be on DAPT for one year following 10/16. At this juncture we can discontinue Brilinta. She should be maintained on aspirin 81 mg. This can be stopped in anticipation of her GI procedure. It should be resumed immediately thereafter

## 2016-08-10 NOTE — Telephone Encounter (Signed)
I placed a call to Dr Olin Pia office to get his nurse to call me back.

## 2016-08-10 NOTE — Telephone Encounter (Signed)
I called and spoke with daughter [name] and informed her that her mom doesn't have to stop the aspirin 81mg  for her upcoming procedure.  Dr Olin Pia office has called her and she is going to stop her Brilinta going forward.

## 2016-08-10 NOTE — Telephone Encounter (Signed)
I spoke with Precious Bard- see previous phone note.

## 2016-08-10 NOTE — Telephone Encounter (Signed)
New message    Patty from Dr. Carlean Purl is calling asking for a call back from [facility].

## 2016-08-10 NOTE — Telephone Encounter (Signed)
I called and spoke with [name] and also called and spoke with the patient regarding Brilinta. The patient is aware that per Dr. Caryl Comes, she may stop her Brilinta going forward as the goal was to have her on this for a year post PCI. She verbalizes understanding and is also aware to remain on her aspirin 81 mg once daily.

## 2016-08-11 DIAGNOSIS — D631 Anemia in chronic kidney disease: Secondary | ICD-10-CM | POA: Diagnosis not present

## 2016-08-11 DIAGNOSIS — E119 Type 2 diabetes mellitus without complications: Secondary | ICD-10-CM | POA: Diagnosis not present

## 2016-08-11 DIAGNOSIS — Z23 Encounter for immunization: Secondary | ICD-10-CM | POA: Diagnosis not present

## 2016-08-11 DIAGNOSIS — D509 Iron deficiency anemia, unspecified: Secondary | ICD-10-CM | POA: Diagnosis not present

## 2016-08-11 DIAGNOSIS — N2581 Secondary hyperparathyroidism of renal origin: Secondary | ICD-10-CM | POA: Diagnosis not present

## 2016-08-11 DIAGNOSIS — N186 End stage renal disease: Secondary | ICD-10-CM | POA: Diagnosis not present

## 2016-08-14 DIAGNOSIS — D509 Iron deficiency anemia, unspecified: Secondary | ICD-10-CM | POA: Diagnosis not present

## 2016-08-14 DIAGNOSIS — E119 Type 2 diabetes mellitus without complications: Secondary | ICD-10-CM | POA: Diagnosis not present

## 2016-08-14 DIAGNOSIS — Z23 Encounter for immunization: Secondary | ICD-10-CM | POA: Diagnosis not present

## 2016-08-14 DIAGNOSIS — D631 Anemia in chronic kidney disease: Secondary | ICD-10-CM | POA: Diagnosis not present

## 2016-08-14 DIAGNOSIS — N186 End stage renal disease: Secondary | ICD-10-CM | POA: Diagnosis not present

## 2016-08-14 DIAGNOSIS — N2581 Secondary hyperparathyroidism of renal origin: Secondary | ICD-10-CM | POA: Diagnosis not present

## 2016-08-15 ENCOUNTER — Encounter (HOSPITAL_COMMUNITY): Payer: Self-pay | Admitting: *Deleted

## 2016-08-15 DIAGNOSIS — R269 Unspecified abnormalities of gait and mobility: Secondary | ICD-10-CM | POA: Diagnosis not present

## 2016-08-16 DIAGNOSIS — E119 Type 2 diabetes mellitus without complications: Secondary | ICD-10-CM | POA: Diagnosis not present

## 2016-08-16 DIAGNOSIS — Z23 Encounter for immunization: Secondary | ICD-10-CM | POA: Diagnosis not present

## 2016-08-16 DIAGNOSIS — N2581 Secondary hyperparathyroidism of renal origin: Secondary | ICD-10-CM | POA: Diagnosis not present

## 2016-08-16 DIAGNOSIS — D509 Iron deficiency anemia, unspecified: Secondary | ICD-10-CM | POA: Diagnosis not present

## 2016-08-16 DIAGNOSIS — D631 Anemia in chronic kidney disease: Secondary | ICD-10-CM | POA: Diagnosis not present

## 2016-08-16 DIAGNOSIS — N186 End stage renal disease: Secondary | ICD-10-CM | POA: Diagnosis not present

## 2016-08-17 ENCOUNTER — Ambulatory Visit (HOSPITAL_COMMUNITY): Payer: Medicare Other | Admitting: Certified Registered Nurse Anesthetist

## 2016-08-17 ENCOUNTER — Encounter (HOSPITAL_COMMUNITY)
Admission: RE | Disposition: A | Discharge status: Home / Self Care | Payer: Self-pay | Source: Ambulatory Visit | Attending: Pulmonary Disease

## 2016-08-17 ENCOUNTER — Inpatient Hospital Stay (HOSPITAL_COMMUNITY): Payer: Medicare Other

## 2016-08-17 ENCOUNTER — Inpatient Hospital Stay (HOSPITAL_COMMUNITY)
Admission: RE | Admit: 2016-08-17 | Discharge: 2016-08-20 | DRG: 286 | Disposition: A | Discharge status: Home / Self Care | Payer: Medicare Other | Source: Ambulatory Visit | Attending: Internal Medicine | Admitting: Internal Medicine

## 2016-08-17 ENCOUNTER — Other Ambulatory Visit (HOSPITAL_COMMUNITY)
Admission: AD | Admit: 2016-08-17 | Discharge: 2016-08-17 | Disposition: A | Discharge status: Home / Self Care | Payer: Medicare Other | Source: Ambulatory Visit | Attending: Internal Medicine | Admitting: Internal Medicine

## 2016-08-17 ENCOUNTER — Encounter (HOSPITAL_COMMUNITY): Payer: Self-pay

## 2016-08-17 DIAGNOSIS — R1319 Other dysphagia: Secondary | ICD-10-CM

## 2016-08-17 DIAGNOSIS — Z7951 Long term (current) use of inhaled steroids: Secondary | ICD-10-CM

## 2016-08-17 DIAGNOSIS — I447 Left bundle-branch block, unspecified: Secondary | ICD-10-CM | POA: Diagnosis not present

## 2016-08-17 DIAGNOSIS — Z91041 Radiographic dye allergy status: Secondary | ICD-10-CM

## 2016-08-17 DIAGNOSIS — N186 End stage renal disease: Secondary | ICD-10-CM | POA: Diagnosis not present

## 2016-08-17 DIAGNOSIS — Z992 Dependence on renal dialysis: Secondary | ICD-10-CM

## 2016-08-17 DIAGNOSIS — Z888 Allergy status to other drugs, medicaments and biological substances status: Secondary | ICD-10-CM

## 2016-08-17 DIAGNOSIS — Z9842 Cataract extraction status, left eye: Secondary | ICD-10-CM

## 2016-08-17 DIAGNOSIS — I132 Hypertensive heart and chronic kidney disease with heart failure and with stage 5 chronic kidney disease, or end stage renal disease: Secondary | ICD-10-CM | POA: Diagnosis present

## 2016-08-17 DIAGNOSIS — Z7982 Long term (current) use of aspirin: Secondary | ICD-10-CM | POA: Diagnosis not present

## 2016-08-17 DIAGNOSIS — E1121 Type 2 diabetes mellitus with diabetic nephropathy: Secondary | ICD-10-CM | POA: Diagnosis not present

## 2016-08-17 DIAGNOSIS — D132 Benign neoplasm of duodenum: Secondary | ICD-10-CM

## 2016-08-17 DIAGNOSIS — R1314 Dysphagia, pharyngoesophageal phase: Secondary | ICD-10-CM | POA: Diagnosis present

## 2016-08-17 DIAGNOSIS — Z833 Family history of diabetes mellitus: Secondary | ICD-10-CM

## 2016-08-17 DIAGNOSIS — T8859XA Other complications of anesthesia, initial encounter: Secondary | ICD-10-CM | POA: Diagnosis not present

## 2016-08-17 DIAGNOSIS — I5042 Chronic combined systolic (congestive) and diastolic (congestive) heart failure: Secondary | ICD-10-CM | POA: Diagnosis not present

## 2016-08-17 DIAGNOSIS — Z79899 Other long term (current) drug therapy: Secondary | ICD-10-CM

## 2016-08-17 DIAGNOSIS — I25118 Atherosclerotic heart disease of native coronary artery with other forms of angina pectoris: Secondary | ICD-10-CM | POA: Diagnosis present

## 2016-08-17 DIAGNOSIS — I97121 Postprocedural cardiac arrest following other surgery: Principal | ICD-10-CM | POA: Diagnosis present

## 2016-08-17 DIAGNOSIS — R0603 Acute respiratory distress: Secondary | ICD-10-CM

## 2016-08-17 DIAGNOSIS — T41295A Adverse effect of other general anesthetics, initial encounter: Secondary | ICD-10-CM | POA: Diagnosis present

## 2016-08-17 DIAGNOSIS — K209 Esophagitis, unspecified without bleeding: Secondary | ICD-10-CM

## 2016-08-17 DIAGNOSIS — I469 Cardiac arrest, cause unspecified: Secondary | ICD-10-CM

## 2016-08-17 DIAGNOSIS — R0609 Other forms of dyspnea: Secondary | ICD-10-CM

## 2016-08-17 DIAGNOSIS — I252 Old myocardial infarction: Secondary | ICD-10-CM

## 2016-08-17 DIAGNOSIS — Z955 Presence of coronary angioplasty implant and graft: Secondary | ICD-10-CM

## 2016-08-17 DIAGNOSIS — M19041 Primary osteoarthritis, right hand: Secondary | ICD-10-CM | POA: Diagnosis present

## 2016-08-17 DIAGNOSIS — E1122 Type 2 diabetes mellitus with diabetic chronic kidney disease: Secondary | ICD-10-CM | POA: Diagnosis present

## 2016-08-17 DIAGNOSIS — Z9841 Cataract extraction status, right eye: Secondary | ICD-10-CM

## 2016-08-17 DIAGNOSIS — Z933 Colostomy status: Secondary | ICD-10-CM

## 2016-08-17 DIAGNOSIS — Z89431 Acquired absence of right foot: Secondary | ICD-10-CM

## 2016-08-17 DIAGNOSIS — I471 Supraventricular tachycardia: Secondary | ICD-10-CM | POA: Diagnosis not present

## 2016-08-17 DIAGNOSIS — I5022 Chronic systolic (congestive) heart failure: Secondary | ICD-10-CM | POA: Diagnosis not present

## 2016-08-17 DIAGNOSIS — K2990 Gastroduodenitis, unspecified, without bleeding: Secondary | ICD-10-CM

## 2016-08-17 DIAGNOSIS — Z8673 Personal history of transient ischemic attack (TIA), and cerebral infarction without residual deficits: Secondary | ICD-10-CM

## 2016-08-17 DIAGNOSIS — K920 Hematemesis: Secondary | ICD-10-CM | POA: Diagnosis present

## 2016-08-17 DIAGNOSIS — I9581 Postprocedural hypotension: Secondary | ICD-10-CM | POA: Diagnosis present

## 2016-08-17 DIAGNOSIS — K2210 Ulcer of esophagus without bleeding: Secondary | ICD-10-CM | POA: Diagnosis not present

## 2016-08-17 DIAGNOSIS — Z85038 Personal history of other malignant neoplasm of large intestine: Secondary | ICD-10-CM

## 2016-08-17 DIAGNOSIS — K2970 Gastritis, unspecified, without bleeding: Secondary | ICD-10-CM

## 2016-08-17 DIAGNOSIS — Z91012 Allergy to eggs: Secondary | ICD-10-CM

## 2016-08-17 DIAGNOSIS — K210 Gastro-esophageal reflux disease with esophagitis: Secondary | ICD-10-CM | POA: Diagnosis present

## 2016-08-17 DIAGNOSIS — I255 Ischemic cardiomyopathy: Secondary | ICD-10-CM

## 2016-08-17 DIAGNOSIS — I5043 Acute on chronic combined systolic (congestive) and diastolic (congestive) heart failure: Secondary | ICD-10-CM | POA: Diagnosis not present

## 2016-08-17 DIAGNOSIS — R1310 Dysphagia, unspecified: Secondary | ICD-10-CM

## 2016-08-17 DIAGNOSIS — Z961 Presence of intraocular lens: Secondary | ICD-10-CM | POA: Diagnosis present

## 2016-08-17 DIAGNOSIS — I2510 Atherosclerotic heart disease of native coronary artery without angina pectoris: Secondary | ICD-10-CM | POA: Diagnosis not present

## 2016-08-17 DIAGNOSIS — K317 Polyp of stomach and duodenum: Secondary | ICD-10-CM

## 2016-08-17 DIAGNOSIS — E876 Hypokalemia: Secondary | ICD-10-CM | POA: Diagnosis present

## 2016-08-17 DIAGNOSIS — Z90710 Acquired absence of both cervix and uterus: Secondary | ICD-10-CM

## 2016-08-17 DIAGNOSIS — I953 Hypotension of hemodialysis: Secondary | ICD-10-CM | POA: Diagnosis present

## 2016-08-17 DIAGNOSIS — H409 Unspecified glaucoma: Secondary | ICD-10-CM | POA: Diagnosis present

## 2016-08-17 DIAGNOSIS — E785 Hyperlipidemia, unspecified: Secondary | ICD-10-CM | POA: Diagnosis present

## 2016-08-17 DIAGNOSIS — Y92234 Operating room of hospital as the place of occurrence of the external cause: Secondary | ICD-10-CM | POA: Diagnosis present

## 2016-08-17 DIAGNOSIS — K219 Gastro-esophageal reflux disease without esophagitis: Secondary | ICD-10-CM | POA: Diagnosis not present

## 2016-08-17 DIAGNOSIS — D638 Anemia in other chronic diseases classified elsewhere: Secondary | ICD-10-CM | POA: Diagnosis present

## 2016-08-17 DIAGNOSIS — I517 Cardiomegaly: Secondary | ICD-10-CM | POA: Diagnosis not present

## 2016-08-17 DIAGNOSIS — M19042 Primary osteoarthritis, left hand: Secondary | ICD-10-CM | POA: Diagnosis present

## 2016-08-17 HISTORY — PX: ESOPHAGOGASTRODUODENOSCOPY (EGD) WITH PROPOFOL: SHX5813

## 2016-08-17 HISTORY — PX: BALLOON DILATION: SHX5330

## 2016-08-17 HISTORY — DX: Dependence on renal dialysis: Z99.2

## 2016-08-17 LAB — LACTIC ACID, PLASMA: LACTIC ACID, VENOUS: 1.5 mmol/L (ref 0.5–1.9)

## 2016-08-17 LAB — CBC
HEMATOCRIT: 33.1 % — AB (ref 36.0–46.0)
HEMOGLOBIN: 10.6 g/dL — AB (ref 12.0–15.0)
MCH: 28.3 pg (ref 26.0–34.0)
MCHC: 32 g/dL (ref 30.0–36.0)
MCV: 88.3 fL (ref 78.0–100.0)
Platelets: 153 10*3/uL (ref 150–400)
RBC: 3.75 MIL/uL — AB (ref 3.87–5.11)
RDW: 16.3 % — ABNORMAL HIGH (ref 11.5–15.5)
WBC: 4 10*3/uL (ref 4.0–10.5)

## 2016-08-17 LAB — BASIC METABOLIC PANEL
Anion gap: 11 (ref 5–15)
BUN: 19 mg/dL (ref 6–20)
CHLORIDE: 95 mmol/L — AB (ref 101–111)
CO2: 34 mmol/L — AB (ref 22–32)
Calcium: 8.3 mg/dL — ABNORMAL LOW (ref 8.9–10.3)
Creatinine, Ser: 3.65 mg/dL — ABNORMAL HIGH (ref 0.44–1.00)
GFR calc non Af Amer: 12 mL/min — ABNORMAL LOW (ref >60)
GFR, EST AFRICAN AMERICAN: 14 mL/min — AB (ref >60)
Glucose, Bld: 111 mg/dL — ABNORMAL HIGH (ref 65–99)
POTASSIUM: 2.9 mmol/L — AB (ref 3.5–5.1)
SODIUM: 140 mmol/L (ref 135–145)

## 2016-08-17 LAB — CK TOTAL AND CKMB (NOT AT ARMC)
CK TOTAL: 78 U/L (ref 38–234)
CK, MB: 1.8 ng/mL (ref 0.5–5.0)
RELATIVE INDEX: INVALID (ref 0.0–2.5)

## 2016-08-17 LAB — GLUCOSE, CAPILLARY
GLUCOSE-CAPILLARY: 100 mg/dL — AB (ref 65–99)
GLUCOSE-CAPILLARY: 101 mg/dL — AB (ref 65–99)
Glucose-Capillary: 132 mg/dL — ABNORMAL HIGH (ref 65–99)
Glucose-Capillary: 172 mg/dL — ABNORMAL HIGH (ref 65–99)
Glucose-Capillary: 89 mg/dL (ref 65–99)

## 2016-08-17 LAB — TROPONIN I: TROPONIN I: 0.03 ng/mL — AB (ref <0.03)

## 2016-08-17 LAB — MRSA PCR SCREENING: MRSA BY PCR: POSITIVE — AB

## 2016-08-17 SURGERY — ESOPHAGOGASTRODUODENOSCOPY (EGD) WITH PROPOFOL
Anesthesia: Monitor Anesthesia Care

## 2016-08-17 MED ORDER — ONDANSETRON HCL 4 MG/2ML IJ SOLN
INTRAMUSCULAR | Status: DC | PRN
Start: 2016-08-17 — End: 2016-08-17
  Administered 2016-08-17: 4 mg via INTRAVENOUS

## 2016-08-17 MED ORDER — PROPOFOL 10 MG/ML IV BOLUS
INTRAVENOUS | Status: AC
  Filled 2016-08-17: qty 40

## 2016-08-17 MED ORDER — PROPOFOL 500 MG/50ML IV EMUL
INTRAVENOUS | Status: DC | PRN
  Administered 2016-08-17: 200 ug/kg/min via INTRAVENOUS

## 2016-08-17 MED ORDER — CHLORHEXIDINE GLUCONATE CLOTH 2 % EX PADS
6.0000 | MEDICATED_PAD | Freq: Every day | CUTANEOUS | Status: DC
  Administered 2016-08-18 – 2016-08-20 (×3): 6 via TOPICAL

## 2016-08-17 MED ORDER — POTASSIUM CHLORIDE CRYS ER 20 MEQ PO TBCR
20.0000 meq | EXTENDED_RELEASE_TABLET | Freq: Once | ORAL | Status: AC
  Administered 2016-08-17: 20 meq via ORAL
  Filled 2016-08-17: qty 1

## 2016-08-17 MED ORDER — ONDANSETRON HCL 4 MG/2ML IJ SOLN
INTRAMUSCULAR | Status: AC
  Filled 2016-08-17: qty 2

## 2016-08-17 MED ORDER — HEPARIN SODIUM (PORCINE) 5000 UNIT/ML IJ SOLN
5000.0000 [IU] | Freq: Three times a day (TID) | INTRAMUSCULAR | Status: DC
  Administered 2016-08-17 – 2016-08-18 (×4): 5000 [IU] via SUBCUTANEOUS
  Filled 2016-08-17 (×4): qty 1

## 2016-08-17 MED ORDER — PANTOPRAZOLE SODIUM 40 MG PO TBEC
40.0000 mg | DELAYED_RELEASE_TABLET | Freq: Every day | ORAL | Status: DC
Start: 2016-08-17 — End: 2016-08-20
  Administered 2016-08-17 – 2016-08-20 (×4): 40 mg via ORAL
  Filled 2016-08-17 (×4): qty 1

## 2016-08-17 MED ORDER — PHENYLEPHRINE HCL 10 MG/ML IJ SOLN
INTRAMUSCULAR | Status: DC | PRN
  Administered 2016-08-17 (×3): 80 ug via INTRAVENOUS

## 2016-08-17 MED ORDER — EPHEDRINE SULFATE 50 MG/ML IJ SOLN
INTRAMUSCULAR | Status: DC | PRN
  Administered 2016-08-17: 5 mg via INTRAVENOUS

## 2016-08-17 MED ORDER — ACETAMINOPHEN 325 MG PO TABS
650.0000 mg | ORAL_TABLET | ORAL | Status: DC | PRN
  Administered 2016-08-18: 650 mg via ORAL
  Filled 2016-08-17: qty 2

## 2016-08-17 MED ORDER — INSULIN ASPART 100 UNIT/ML ~~LOC~~ SOLN
0.0000 [IU] | SUBCUTANEOUS | Status: DC
  Administered 2016-08-17: 2 [IU] via SUBCUTANEOUS
  Administered 2016-08-17: 1 [IU] via SUBCUTANEOUS
  Administered 2016-08-18 – 2016-08-19 (×3): 2 [IU] via SUBCUTANEOUS
  Administered 2016-08-19: 1 [IU] via SUBCUTANEOUS
  Administered 2016-08-19 – 2016-08-20 (×3): 2 [IU] via SUBCUTANEOUS

## 2016-08-17 MED ORDER — SODIUM CHLORIDE 0.9 % IV SOLN: 250.0000 mL | INTRAVENOUS | Status: DC | PRN

## 2016-08-17 MED ORDER — MUPIROCIN 2 % EX OINT
1.0000 "application " | TOPICAL_OINTMENT | Freq: Two times a day (BID) | CUTANEOUS | Status: DC
  Administered 2016-08-17 – 2016-08-20 (×6): 1 via NASAL
  Filled 2016-08-17 (×4): qty 22

## 2016-08-17 MED ORDER — PHENYLEPHRINE 40 MCG/ML (10ML) SYRINGE FOR IV PUSH (FOR BLOOD PRESSURE SUPPORT)
PREFILLED_SYRINGE | INTRAVENOUS | Status: AC
  Filled 2016-08-17: qty 10

## 2016-08-17 MED ORDER — SODIUM CHLORIDE 0.9 % IV SOLN
INTRAVENOUS | Status: DC
  Administered 2016-08-17: 10:00:00 via INTRAVENOUS

## 2016-08-17 MED ORDER — ONDANSETRON HCL 4 MG/2ML IJ SOLN: 4.0000 mg | Freq: Four times a day (QID) | INTRAMUSCULAR | Status: DC | PRN

## 2016-08-17 MED ORDER — LIDOCAINE 2% (20 MG/ML) 5 ML SYRINGE
INTRAMUSCULAR | Status: AC
  Filled 2016-08-17: qty 5

## 2016-08-17 MED ORDER — SODIUM CHLORIDE 0.9 % IV BOLUS (SEPSIS)
500.0000 mL | Freq: Once | INTRAVENOUS | Status: AC
  Administered 2016-08-17: 500 mL via INTRAVENOUS

## 2016-08-17 MED ORDER — ASPIRIN 81 MG PO CHEW
81.0000 mg | CHEWABLE_TABLET | Freq: Every day | ORAL | Status: DC
  Administered 2016-08-17 – 2016-08-20 (×4): 81 mg via ORAL
  Filled 2016-08-17 (×4): qty 1

## 2016-08-17 MED ORDER — SODIUM CHLORIDE 0.9 % IV SOLN
INTRAVENOUS | Status: DC
  Administered 2016-08-18: 05:00:00 via INTRAVENOUS

## 2016-08-17 MED ORDER — AMIODARONE HCL 200 MG PO TABS
200.0000 mg | ORAL_TABLET | Freq: Every day | ORAL | Status: DC
  Administered 2016-08-18 – 2016-08-20 (×3): 200 mg via ORAL
  Filled 2016-08-17 (×4): qty 1

## 2016-08-17 MED ORDER — METOPROLOL TARTRATE 50 MG PO TABS
50.0000 mg | ORAL_TABLET | ORAL | Status: DC
  Administered 2016-08-20: 50 mg via ORAL
  Filled 2016-08-17 (×3): qty 1

## 2016-08-17 MED ORDER — LIDOCAINE 2% (20 MG/ML) 5 ML SYRINGE
INTRAMUSCULAR | Status: DC | PRN
  Administered 2016-08-17: 40 mg via INTRAVENOUS

## 2016-08-17 SURGICAL SUPPLY — 14 items

## 2016-08-17 NOTE — Progress Notes (Signed)
Cath lab will try and get her done early so she can have dialysis post cath.  Kerin Ransom PA-C 08/17/2016 3:13 PM

## 2016-08-17 NOTE — Anesthesia Postprocedure Evaluation (Signed)
Anesthesia Post Note  Patient: [name]  Procedure(s) Performed: Procedure(s) (LRB): ESOPHAGOGASTRODUODENOSCOPY (EGD) WITH PROPOFOL (N/A) POSSIBLE BALLOON [place] (N/A)     Patient location during evaluation: PACU Anesthesia Type: MAC Level of consciousness: sedated and awake Pain management: pain level controlled Vital Signs Assessment: post-procedure vital signs reviewed and stable Respiratory status: spontaneous breathing, nonlabored ventilation, respiratory function stable and patient connected to nasal cannula oxygen Cardiovascular status: stable and blood pressure returned to baseline Anesthetic complications: yes Anesthetic complication details: respiratory eventComments: At end of prcedure patient had cardioresp arrest , CPR begun, vasoactive drugs given, PEA noted during this time . Patient had known hisitory of significant cardiac disease and low EF. She was delayed on awakening but then had full return of faculties. She will be admitted by CCM and transferred to [facility] for dialysis.    Last Vitals:  Vitals:   08/17/16 1130 08/17/16 1145  BP:  128/62  Pulse: (!) 123 78  Resp: 16 (!) 28  Temp:    SpO2: 100% 100%    Last Pain:  Vitals:   08/17/16 1145  TempSrc:   PainSc: 0-No pain                 Kamaal Cast,JAMES TERRILL

## 2016-08-17 NOTE — Consult Note (Signed)
Cardiology Consultation:   Patient ID: [name]; [id number]; August 07, 1945   Admit date: 08/17/2016 Date of Consult: 08/17/2016  Primary Care Provider: Cassandria Anger, MD Primary Cardiologist: Dr Curt Bears Primary Electrophysiologist:  Dr Ellyn Hack   Patient Profile:   [name] is a 71 y.o. female with a hx of ESRD on HD and ICM who is being seen today for the evaluation of PEA arrest at the request of Elsworth Soho.  History of Present Illness:   [name] is a pleasant 71 y/o female who has been on HD since 2012 (MWF at [facility]). She has a history of CAD, s/p Dx1 PCI with DES Oct 2016. Echo in July 2017 showed an EF of 25-30%. She has had atrial tachycardia and has been on Amiodarone. In Nov 2017 she had RFA by Dr Curt Bears and has done well since from a cardiac standpoint, no chest pain or palpitations. She recently developed dysphagia and was set up to have an OP EGD today. As the probe was removed she apparently became pulseless and required brief CPR. There is no mention of arrhythmia. She is seen now in the CCU. She is awake and alert, sitting up in bed eating liquids. Telemetry shows NSR, LBBB (old).   Past Medical History:  Diagnosis Date  . Acute respiratory failure with hypoxia ([place]) 10/23/2014  . Anemia   . Arthritis    "hands" (11/17/2015)  . Atrial tachycardia ([place])    on amiod  . Cardiomyopathy- mixed   . CHF (congestive heart failure) ([place])   . Chronic diastolic heart failure ([place])   . Colon cancer ([place]) 1986  . Colostomy in place [facility])   . Coronary artery disease    Previously decreased EF; echo 113 normal LV function  . Dialysis patient ([place])   . ESRD (end stage renal disease) on dialysis [facility])    Dr Dunham/Dr. Lyda Kalata.  M, W, Fr; [place] (11/17/2015)  . GERD (gastroesophageal reflux disease)   . Glaucoma   . Heart murmur   . Hernia, incisional    abd  . Hyperlipidemia   . Hypertension   . Hypertensive heart disease    sees Dr. Alain Marion  .  LBBB (left bundle branch block)   . Mitral valve insufficiency and aortic valve insufficiency   . Obesity   . Stroke [facility])    2011/12  . Type II diabetes mellitus ([place])     Past Surgical History:  Procedure Laterality Date  . ARTERIOVENOUS GRAFT PLACEMENT Left 2010   "forearm"  . CARDIAC CATHETERIZATION    . CARDIAC CATHETERIZATION N/A 10/27/2014   Procedure: Left Heart Cath and Coronary Angiography;  Surgeon: Leonie Man, MD;  Location: [place] CV LAB;  Service: Cardiovascular;  Laterality: N/A;  . CARDIAC CATHETERIZATION N/A 10/28/2014   Procedure: Coronary Stent Intervention;  Surgeon: Peter M Martinique, MD;  Location: [place] CV LAB;  Service: Cardiovascular;  Laterality: N/A;  . CARDIAC CATHETERIZATION Bilateral 12/2008   R. heart cath showed elevated left and right heart filling pressures w/ pulmonary artery pressure elevated mildly out of proportion to the wedge. The left heart cath showed diffuse distal vessel disease as well as a 75% stenosis in the mid circumflex w/ a 90% stenosis of the ostial first obtuse marginal. These lesions were in close proximity. there was a 60-70%mild RCA stenosis.   [name] CATARACT EXTRACTION W/ INTRAOCULAR LENS  IMPLANT, BILATERAL Bilateral   . COLON SURGERY    . COLONOSCOPY    .  COLOSTOMY  1986  . ELECTROCARDIOGRAM  04-27-06  . ELECTROPHYSIOLOGIC STUDY N/A 11/17/2015   Procedure: A-Tach Ablation;  Surgeon: Will Meredith Leeds, MD;  Location: [place] CV LAB;  Service: Cardiovascular;  Laterality: N/A;  . ESOPHAGOGASTRODUODENOSCOPY  03-18-04  . ESOPHAGOGASTRODUODENOSCOPY N/A 02/02/2013   Procedure: ESOPHAGOGASTRODUODENOSCOPY (EGD);  Surgeon: Irene Shipper, MD;  Location: [facility] ENDOSCOPY;  Service: Endoscopy;  Laterality: N/A;  . EYE SURGERY    . FOOT AMPUTATION THROUGH METATARSAL  10-07-10   Right foot transmetatarsal  . INSERTION OF AHMED VALVE Right 08/20/2013   Procedure: INSERTION OF AHMED VALVE WITH Mitomycin C application;  Surgeon: Marylynn Pearson, MD;  Location: [place];  Service: Ophthalmology;  Laterality: Right;  . INSERTION OF AHMED VALVE Left 07/22/2014   Procedure: INSERTION OF AHMED VALVE WITH [place];  Surgeon: Marylynn Pearson, MD;  Location: [place];  Service: Ophthalmology;  Laterality: Left;  . MITOMYCIN C APPLICATION Left 8/67/6195   Procedure: MITOMYCIN C APPLICATION;  Surgeon: Marylynn Pearson, MD;  Location: [place];  Service: Ophthalmology;  Laterality: Left;  . PARS PLANA VITRECTOMY  11/29/2011   Procedure: PARS PLANA VITRECTOMY WITH 23 GAUGE;  Surgeon: Adonis Brook, MD;  Location: [place];  Service: Ophthalmology;  Laterality: Right;  Right Eye 23 ga vitrectomy with membrane peel  . PARS PLANA VITRECTOMY Left 02/28/2012   Procedure: PARS PLANA VITRECTOMY WITH 23 GAUGE;  Surgeon: Adonis Brook, MD;  Location: [place];  Service: Ophthalmology;  Laterality: Left;  . REVISION UROSTOMY CUTANEOUS    . SUPRAVENTRICULAR TACHYCARDIA ABLATION  11/17/2015  . VAGINAL HYSTERECTOMY       Inpatient Medications: Scheduled Meds: . aspirin  81 mg Oral Daily  . heparin  5,000 Units Subcutaneous Q8H  . insulin aspart  0-9 Units Subcutaneous Q4H  . metoprolol tartrate  50 mg Oral Q T,Th,S,Su  . pantoprazole  40 mg Oral Daily   Continuous Infusions: . sodium chloride    . sodium chloride 500 mL (08/17/16 1315)   PRN Meds: sodium chloride, acetaminophen, ondansetron (ZOFRAN) IV  Allergies:    Allergies  Allergen Reactions  . Ace Inhibitors Cough  . Eggs Or Egg-Derived Products Nausea And Vomiting  . Enalapril Cough  . Lisinopril Cough  . Omnipaque [Iohexol] Hives    Social History:   Social History   Social History  . Marital status: Widowed    Spouse name: N/A  . Number of children: N/A  . Years of education: N/A   Occupational History  . Not on file.   Social History Main Topics  . Smoking status: Never Smoker  . Smokeless tobacco: Never Used  . Alcohol use No  . Drug use: No  . Sexual activity: No   Other  Topics Concern  . Not on file   Social History Narrative  . No narrative on file    Family History:   Family History  Problem Relation Age of Onset  . Hypertension Mother   . Coronary artery disease Mother   . Hypertension Father   . Diabetes Father   . Cancer Sister        colon  . Hypertension Other      ROS:  Please see the history of present illness.  ROS  All other ROS reviewed and negative.     Physical Exam/Data:   Vitals:   08/17/16 1250 08/17/16 1255 08/17/16 1300 08/17/16 1305  BP: (!) 78/58 (!) 91/52 (!) 127/57 (!) 116/47  Pulse: (!) 46 (!) 54 81 (!)  56  Resp: (!) 25 (!) 27 11 20   Temp:      TempSrc:      SpO2: 92% 94%  94%  Weight:      Height:        Intake/Output Summary (Last 24 hours) at 08/17/16 1332 Last data filed at 08/17/16 1232  Gross per 24 hour  Intake           647.85 ml  Output                0 ml  Net           647.85 ml   Filed Weights   08/17/16 0925  Weight: 182 lb (82.6 kg)   Body mass index is 35.54 kg/m.  General:  Well nourished, well developed, in no acute distress HEENT: normal Lymph: no adenopathy Neck: no JVD Endocrine:  No thryomegaly Vascular: No carotid bruits; FA pulses 2+ bilaterally without bruits  Cardiac:  normal S1, S2; RRR; no murmur  Lungs:  clear to auscultation bilaterally, no wheezing, rhonchi or rales  Abd: soft, nontender, no hepatomegaly  Ext: no edema Musculoskeletal:  No deformities, BUE and BLE strength normal and equal Skin: warm and dry  Neuro:  CNs 2-12 intact, no focal abnormalities noted Psych:  Normal affect   EKG:  The EKG was personally reviewed and demonstrates:  NSR, LBBB, 1st AVB  Telemetry:  Telemetry was personally reviewed and demonstrates:  NSR   Relevant CV Studies: Echo 08/03/15- Study Conclusions  - Left ventricle: The cavity size was normal. Wall thickness was   increased in a pattern of moderate LVH. Systolic function was   severely reduced. The estimated ejection  fraction was in the   range of 25% to 30%. Diffuse hypokinesis. - Ventricular septum: Septal motion showed abnormal function and   dyssynergy. - Aortic valve: There was mild regurgitation. - Mitral valve: Severely calcified annulus. Mildly thickened   leaflets . There was moderate regurgitation directed posteriorly. - Left atrium: The atrium was moderately dilated.   Anterior-posterior dimension: 51 mm. - Tricuspid valve: There was mild regurgitation.  Impressions:  - Compared to the prior study, there has been no significant   interval change.    Cath 10/28/14-   Conclusion    1st Diag lesion, 95% stenosed. Post intervention, there is a 0% residual stenosis.  Prox RCA to Mid RCA lesion, 95% stenosed. Post intervention, there is a 95% residual stenosis.   1. Successful stenting of the first diagonal with a DES 2. Unsuccessful stenting of the mid RCA due to inability to cross with a balloon. This despite good wire, guide, and Guideliner support.  Plan: continue DAPT for one year. Optimize antianginal therapy. If she continues to have significant angina I would consider rotational atherectomy and stenting of the RCA.       Laboratory Data:  Chemistry Recent Labs Lab 08/17/16 1140  NA 140  K 2.9*  CL 95*  CO2 34*  GLUCOSE 111*  BUN 19  CREATININE 3.65*  CALCIUM 8.3*  GFRNONAA 12*  GFRAA 14*  ANIONGAP 11    No results for input(s): PROT, ALBUMIN, AST, ALT, ALKPHOS, BILITOT in the last 168 hours. Hematology Recent Labs Lab 08/17/16 1140  WBC 4.0  RBC 3.75*  HGB 10.6*  HCT 33.1*  MCV 88.3  MCH 28.3  MCHC 32.0  RDW 16.3*  PLT 153   Cardiac Enzymes Recent Labs Lab 08/17/16 1140  TROPONINI 0.03*   No results for input(s):  TROPIPOC in the last 168 hours.  BNPNo results for input(s): BNP, PROBNP in the last 168 hours.  DDimer No results for input(s): DDIMER in the last 168 hours.  Radiology/Studies:  Dg Chest Port 1 View  Result Date:  08/17/2016 CLINICAL DATA:  Acute respiratory distress during endoscopy EXAM: PORTABLE CHEST 1 VIEW COMPARISON:  07/23/2016 FINDINGS: Chronic cardiomegaly. Stable aortic contours. There is no edema, consolidation, effusion, or pneumothorax. No acute osseous finding. IMPRESSION: 1. Stable from prior.  No acute finding. 2. Chronic cardiomegaly. Electronically Signed   By: Monte Fantasia M.D.   On: 08/17/2016 11:49    Assessment and Plan:   PEA arrest during endoscopy- Suspect transient hypotension  CAD- S/p PCI 2016 as described, no complaints of angina. R/O MI.  Brilinta recently DC'd in preparation for endoscopy-we were not going to resume this-ASA 81 mg only  ICM- Continue to monitor for arrhythmia  ESRD- HD MWF  PAT- Pt is s/p RFA Nov 2017 and is on Amiodarone, followed by Dr Curt Bears  Hypokalemia- K+ 2.9-   Plan:  Will add K-dur 20 meq. Continue Lopressor and Amiodarone 200 mg daily.    Signed, Kerin Ransom, PA-C  08/17/2016 1:32 PM

## 2016-08-17 NOTE — Interval H&P Note (Signed)
History and Physical Interval Note:  08/17/2016 10:10 AM  [name]  has presented today for surgery, with the diagnosis of hematemesis, dysphagia  The various methods of treatment have been discussed with the patient and family. After consideration of risks, benefits and other options for treatment, the patient has consented to  Procedure(s) with comments: ESOPHAGOGASTRODUODENOSCOPY (EGD) WITH PROPOFOL (N/A) -  NO XRAY NEEDED POSSIBLE BALLOON DILATION POSSIBLE MALONEY (N/A) as a surgical intervention .  The patient's history has been reviewed, patient examined, no change in status, stable for surgery.  I have reviewed the patient's chart and labs.  Questions were answered to the patient's satisfaction.     [name]

## 2016-08-17 NOTE — Transfer of Care (Signed)
Immediate Anesthesia Transfer of Care Note  Patient: [name]  Procedure(s) Performed: Procedure(s) with comments: ESOPHAGOGASTRODUODENOSCOPY (EGD) WITH PROPOFOL (N/A) -  NO XRAY NEEDED POSSIBLE BALLOON DILATION POSSIBLE MALONEY (N/A)  Patient Location: PACU  Anesthesia Type:MAC  Level of Consciousness:  sedated, patient cooperative and responds to stimulation  Airway & Oxygen Therapy:Patient Spontanous Breathing and Patient connected to face mask oxgen  Post-op Assessment:  Report given to PACU RN and Post -op Vital signs reviewed and stable  Post vital signs:  Reviewed and stable  Last Vitals:  Vitals:   08/17/16 0925  BP: (!) 111/50  Pulse: 89  Resp: 19  Temp: 36.6 C  SpO2: 726%    Complications: No apparent anesthesia complications

## 2016-08-17 NOTE — H&P (View-Only) (Signed)
[name] 71 y.o. [birth year] [id number]  Assessment & Plan:   Encounter Diagnoses  Name Primary?  . Hematemesis with nausea Yes  . Esophageal dysphagia   . Encounter for long-term use of antiplatelets/antithrombotics - ticagrelor   . CAD S/P percutaneous coronary angioplasty     Scheduled endoscopy 08/17/2016 to be performed at [facility].  Necessary to visualize possible source of bleeding and possibly perform dilation (Baloon vs Venia Minks) to treat dysphagia. We will communicate with cardiology for confirmation as to when to d/c and restart Brilinta.  Prescriptions sent to begin Pantoprazole for acid control and indigestion symptoms.  Zofran prescription sent for nausea and vomiting.  D/C use of alka-seltzer  It is possible this increase in vomiting is 2ndary to lack of PPI therapy with a history of gastritis and esophagitis. We will review with cardiology to manage  to ensure proper medication management around the time of the endoscopy.  I have personally seen the patient, reviewed and repeated key elements of the history and physical and participated in formation of the assessment and plan the student has documented.  The risks and benefits as well as alternatives of endoscopic procedure(s) have been discussed and reviewed. All questions answered. The patient agrees to proceed.  Gatha Mayer, MD, [facility]   Subjective:   Chief Complaint: Intermittent vomiting with some blood  HPI  Ms Trombetta is a pleasant elderly female accompanied by her daughter today for evaluation of vomiting with a recent episode of having some hematemesis .  She has PMH of DM, ESRD, Atrial Tachycardia, and CHF.  She is on HD 3 days/ week.  She admits to having mild dysphagia with solid foods but liquids are ok.  She has mild indigestion and has been taking Alka-Seltzer to no relief.  Her last Upper Endoscopy was in 2015 with esophagitis and gastritis diagnosed.  Currently she is not  taking a PPI to control acid. She denies chest pain, palpitations, diarrhea, constipation hematochezia .  Allergies  Allergen Reactions  . Ace Inhibitors Cough  . Eggs Or Egg-Derived Products Nausea And Vomiting  . Enalapril Cough  . Lisinopril Cough  . Omnipaque [Iohexol] Hives   Current Meds  Medication Sig  . acetaminophen (TYLENOL) 500 MG tablet Take 500 mg by mouth every 6 (six) hours as needed (pain).  [name] amiodarone (PACERONE) 200 MG tablet Take 1 tablet (200 mg total) by mouth daily.  [name] aspirin 81 MG tablet Take 1 tablet (81 mg total) by mouth daily.  [name] BRILINTA 90 MG TABS tablet TAKE 1 TABLET BY MOUTH TWICE DAILY  . calcium acetate (PHOSLO) 667 MG capsule Take 1,334-2,001 mg by mouth See admin instructions. Take 2001mg  three times daily with meals and Take 1334mg   with snacks.  . dorzolamide-timolol (COSOPT) 22.3-6.8 MG/ML ophthalmic solution Place 1 drop into the left eye 2 (two) times daily.  . Fluticasone-Salmeterol (ADVAIR) 250-50 MCG/DOSE AEPB Inhale 1 puff into the lungs daily as needed (shortness of breath).   [name] glucose blood (ONE TOUCH ULTRA TEST) test strip 1 each by Other route daily. And lancets 1/day  . metoprolol (LOPRESSOR) 50 MG tablet Take 1 tablet (50 mg total) by mouth 2 (two) times daily. Do not take this medication on dialysis days (Monday, Wednesday, Friday). (Patient taking differently: Take 50 mg by mouth daily. Do not take this medication on dialysis days (Monday, Wednesday, Friday).)  . nitroGLYCERIN (NITROSTAT) 0.4 MG SL tablet Place 1 tablet (0.4 mg total) under the tongue every  5 (five) minutes as needed for chest pain.  . repaglinide (PRANDIN) 0.5 MG tablet TAKE 1 TABLET BY MOUTH THREE TIMES DAILY BEFORE MEALS  . simvastatin (ZOCOR) 10 MG tablet Take 1 tablet (10 mg total) by mouth every evening.   Past Medical History:  Diagnosis Date  . Acute respiratory failure with hypoxia ([place]) 10/23/2014  . Anemia   . Arthritis    "hands" (11/17/2015)  . Atrial  tachycardia ([place])    on amiod  . Cardiomyopathy- mixed   . CHF (congestive heart failure) ([place])   . Chronic diastolic heart failure ([place])   . Colon cancer ([place]) 1986  . Colostomy in place [facility])   . Coronary artery disease    Previously decreased EF; echo 113 normal LV function  . ESRD (end stage renal disease) on dialysis [facility])    Dr Dunham/Dr. Lyda Kalata.  M, W, Fr; [place] (11/17/2015)  . GERD (gastroesophageal reflux disease)   . Glaucoma   . Heart murmur   . Hernia, incisional    abd  . Hyperlipidemia   . Hypertension   . Hypertensive heart disease    sees Dr. Alain Marion  . LBBB (left bundle branch block)   . Mitral valve insufficiency and aortic valve insufficiency   . Obesity   . Stroke [facility])    2011/12  . Type II diabetes mellitus ([place])    Past Surgical History:  Procedure Laterality Date  . ARTERIOVENOUS GRAFT PLACEMENT Left 2010   "forearm"  . CARDIAC CATHETERIZATION    . CARDIAC CATHETERIZATION N/A 10/27/2014   Procedure: Left Heart Cath and Coronary Angiography;  Surgeon: Leonie Man, MD;  Location: [place] CV LAB;  Service: Cardiovascular;  Laterality: N/A;  . CARDIAC CATHETERIZATION N/A 10/28/2014   Procedure: Coronary Stent Intervention;  Surgeon: Peter M Martinique, MD;  Location: [place] CV LAB;  Service: Cardiovascular;  Laterality: N/A;  . CARDIAC CATHETERIZATION Bilateral 12/2008   R. heart cath showed elevated left and right heart filling pressures w/ pulmonary artery pressure elevated mildly out of proportion to the wedge. The left heart cath showed diffuse distal vessel disease as well as a 75% stenosis in the mid circumflex w/ a 90% stenosis of the ostial first obtuse marginal. These lesions were in close proximity. there was a 60-70%mild RCA stenosis.   [name] CATARACT EXTRACTION W/ INTRAOCULAR LENS  IMPLANT, BILATERAL Bilateral   . COLON SURGERY    . COLONOSCOPY    . COLOSTOMY  1986  . ELECTROCARDIOGRAM  04-27-06  . ELECTROPHYSIOLOGIC STUDY N/A  11/17/2015   Procedure: A-Tach Ablation;  Surgeon: Will Meredith Leeds, MD;  Location: [place] CV LAB;  Service: Cardiovascular;  Laterality: N/A;  . ESOPHAGOGASTRODUODENOSCOPY  03-18-04  . ESOPHAGOGASTRODUODENOSCOPY N/A 02/02/2013   Procedure: ESOPHAGOGASTRODUODENOSCOPY (EGD);  Surgeon: Irene Shipper, MD;  Location: [facility] ENDOSCOPY;  Service: Endoscopy;  Laterality: N/A;  . EYE SURGERY    . FOOT AMPUTATION THROUGH METATARSAL  10-07-10   Right foot transmetatarsal  . INSERTION OF AHMED VALVE Right 08/20/2013   Procedure: INSERTION OF AHMED VALVE WITH Mitomycin C application;  Surgeon: Marylynn Pearson, MD;  Location: [place];  Service: Ophthalmology;  Laterality: Right;  . INSERTION OF AHMED VALVE Left 07/22/2014   Procedure: INSERTION OF AHMED VALVE WITH [place];  Surgeon: Marylynn Pearson, MD;  Location: [place];  Service: Ophthalmology;  Laterality: Left;  . MITOMYCIN C APPLICATION Left 2/59/5638   Procedure: MITOMYCIN C APPLICATION;  Surgeon: Marylynn Pearson, MD;  Location: [place];  Service: Ophthalmology;  Laterality: Left;  . PARS PLANA VITRECTOMY  11/29/2011   Procedure: PARS PLANA VITRECTOMY WITH 23 GAUGE;  Surgeon: Adonis Brook, MD;  Location: [place];  Service: Ophthalmology;  Laterality: Right;  Right Eye 23 ga vitrectomy with membrane peel  . PARS PLANA VITRECTOMY Left 02/28/2012   Procedure: PARS PLANA VITRECTOMY WITH 23 GAUGE;  Surgeon: Adonis Brook, MD;  Location: [place];  Service: Ophthalmology;  Laterality: Left;  . REVISION UROSTOMY CUTANEOUS    . SUPRAVENTRICULAR TACHYCARDIA ABLATION  11/17/2015  . VAGINAL HYSTERECTOMY     Social History   Social History  . Marital status: Widowed   Social History Main Topics  . Smoking status: Never Smoker  . Smokeless tobacco: Never Used  . Alcohol use No  . Drug use: No  . Sexual activity: No    family history includes Cancer in her sister; Coronary artery disease in her mother; Diabetes in her father; Hypertension in her father, mother, and  other.   Review of Systems CONST: denies weight loss, fatigue, night sweats GI- positive for nausea and vomiting with hemtaemesis. Positive dysphagia. Denies Constipation or Diarrhea. Denies hematochezia     See HPI  Objective:   Physical Exam @BP  (!) 100/54   Pulse 64   Ht 5' (1.524 m)   Wt 182 lb (82.6 kg)   BMI 35.54 kg/m @  General:  Well-developed, well-nourished and in no acute distress Eyes:  anicteric. ENT:   Mouth and posterior pharynx free of lesions.  Neck:   supple w/o thyromegaly or mass.  Lungs: Clear to auscultation bilaterally. Heart:   S1S2, no rubs, murmurs, gallops. Abdomen:  soft, non-tender, no  hepatosplenomegaly, hernia, or mass and BS+.  Colostomy bag intact with small amount of soft,  brown stool.  Urostomy bag clean intact. Rectal: Not perfromed Lymph:  no cervical or supraclavicular adenopathy. Extremities:   2+ edema in lower extremities to knee, no  cyanosis or clubbing LUE AV fistula Skin   no rash. Neuro:  A&O x 3.  Psych:  appropriate mood and  Affect.   Data Reviewed:  CBC Latest Ref Rng & Units 07/23/2016 05/16/2016 01/18/2016  WBC 4.0 - 10.5 K/uL 7.4 6.0 4.7  Hemoglobin 12.0 - 15.0 g/dL 12.3 13.1 11.5(L)  Hematocrit 36.0 - 46.0 % 39.0 42.0 35.7(L)  Platelets 150 - 400 K/uL 192 290 183.0   Lab Results  Component Value Date   CREATININE 4.71 (H) 07/23/2016   CREATININE 4.22 (H) 05/16/2016   CREATININE 4.15 (H) 01/18/2016   Lab Results  Component Value Date   POCGLU 191 06/15/2010    Prior colonoscopy 2006 - negative for neoplasia but hernia and colon stricture - subsequently has BE's 2011 and 2012 not a candidate for colonoscopy due to abnormal colon anatomy and hernia.    Edward Qualia, [place]

## 2016-08-17 NOTE — H&P (Signed)
PULMONARY / CRITICAL CARE MEDICINE   Name: [name] MRN: [id number] DOB: Jun 21, 1945    ADMISSION DATE:  08/17/2016 CONSULTATION DATE:  08/17/2016  REFERRING MD:  Carlean Purl, GI  CHIEF COMPLAINT:  Cardiac arrest in endoscopy  HISTORY OF PRESENT ILLNESS:   72 year old woman with end-stage renal disease and ischemic cardiomyopathy with EF 25%, underwent upper endoscopy 8/9 for evaluation of hematemesis with nausea and esophageal dysphagia since she is on antiplatelet agents. IV propofol was used by anesthesia. Procedure was uneventful, no bradycardia noted, mild esophagitis was noted and a sessile polyp was removed from the duodenum with minimal blood loss. As the scope was removed , the patient was found to be pulseless. She required cardiac compressions for less than a minute until return of carotid pulse. Neo-Synephrine was pushed by anesthesia. Cardiac rhythm remained normal throughout. She was ventilated briefly with bag and mask until she regained consciousness. It was felt given her comorbidities that she should be admitted for observation for 24 hours. Of note last dialysis was on Wednesday 8/8  PAST MEDICAL HISTORY :  She  has a past medical history of Acute respiratory failure with hypoxia ([place]) (10/23/2014); Anemia; Arthritis; Atrial tachycardia ([place]); Cardiomyopathy- mixed; CHF (congestive heart failure) ([place]); Chronic diastolic heart failure ([place]); Colon cancer ([place]) (1986); Colostomy in place [facility]); Coronary artery disease; Dialysis patient [facility]); ESRD (end stage renal disease) on dialysis ([place]); GERD (gastroesophageal reflux disease); Glaucoma; Heart murmur; Hernia, incisional; Hyperlipidemia; Hypertension; Hypertensive heart disease; LBBB (left bundle branch block); Mitral valve insufficiency and aortic valve insufficiency; Obesity; Stroke ([place]); and Type II diabetes mellitus ([place]).  PAST SURGICAL HISTORY: She  has a past surgical history that includes Colostomy (1986); Revision  urostomy cutaneous; Esophagogastroduodenoscopy (03-18-04); electrocardiogram (04-27-06); Arteriovenous graft placement (Left, 2010); Foot amputation through metatarsal (10-07-10); Cardiac catheterization; Pars plana vitrectomy (11/29/2011); Pars plana vitrectomy (Left, 02/28/2012); Esophagogastroduodenoscopy (N/A, 02/02/2013); Colonoscopy; Insertion of ahmed valve (Right, 08/20/2013); Insertion of ahmed valve (Left, 07/22/2014); Mitomycin c application (Left, 6/96/2952); Cardiac catheterization (N/A, 10/27/2014); Cardiac catheterization (N/A, 10/28/2014); Cardiac catheterization (Bilateral, 12/2008); Supraventricular tachycardia ablation (11/17/2015); Colon surgery; Eye surgery; Cataract extraction w/ intraocular lens  implant, bilateral (Bilateral); Vaginal hysterectomy; and Cardiac catheterization (N/A, 11/17/2015).  Allergies  Allergen Reactions  . Ace Inhibitors Cough  . Eggs Or Egg-Derived Products Nausea And Vomiting  . Enalapril Cough  . Lisinopril Cough  . Omnipaque [Iohexol] Hives    Current Facility-Administered Medications on File Prior to Encounter  Medication  . mitoMYcin (MUTAMYCIN) Injection Use in OR only (0.4 mg/ml)   Current Outpatient Prescriptions on File Prior to Encounter  Medication Sig  . acetaminophen (TYLENOL) 500 MG tablet Take 500-1,000 mg by mouth every 6 (six) hours as needed (pain).   [name] amiodarone (PACERONE) 200 MG tablet Take 1 tablet (200 mg total) by mouth daily.  [name] aspirin 81 MG tablet Take 1 tablet (81 mg total) by mouth daily.  . calcium acetate (PHOSLO) 667 MG capsule Take 1,334-2,001 mg by mouth See admin instructions. Take 2001mg  three times daily with meals and Take 1334mg   with snacks.  . dorzolamide-timolol (COSOPT) 22.3-6.8 MG/ML ophthalmic solution Place 1 drop into the left eye 2 (two) times daily.  . Fluticasone-Salmeterol (ADVAIR) 250-50 MCG/DOSE AEPB Inhale 1 puff into the lungs daily as needed (shortness of breath).   . metoprolol (LOPRESSOR) 50 MG  tablet Take 1 tablet (50 mg total) by mouth 2 (two) times daily. Do not take this medication on dialysis days (Monday, Wednesday, Friday). (Patient taking differently: Take 50  mg by mouth every Tuesday, Thursday, Saturday, and Sunday. IN THE MORNING ONLY. Do not take this medication on dialysis days (Monday, Wednesday, Friday).)  . repaglinide (PRANDIN) 0.5 MG tablet TAKE 1 TABLET BY MOUTH THREE TIMES DAILY BEFORE MEALS  . simvastatin (ZOCOR) 10 MG tablet Take 1 tablet (10 mg total) by mouth every evening.  [name] glucose blood (ONE TOUCH ULTRA TEST) test strip 1 each by Other route daily. And lancets 1/day  . nitroGLYCERIN (NITROSTAT) 0.4 MG SL tablet Place 1 tablet (0.4 mg total) under the tongue every 5 (five) minutes as needed for chest pain.  [name] ondansetron (ZOFRAN) 4 MG tablet Take 1 tablet (4 mg total) by mouth every 8 (eight) hours as needed for nausea or vomiting. (Patient not taking: Reported on 08/15/2016)  . pantoprazole (PROTONIX) 40 MG tablet Take 1 tablet (40 mg total) by mouth daily before breakfast. (Patient not taking: Reported on 08/15/2016)    FAMILY HISTORY:  Her indicated that her mother is deceased. She indicated that her father is deceased. She indicated that the status of her sister is unknown. She indicated that her maternal grandmother is deceased. She indicated that her maternal grandfather is deceased. She indicated that her paternal grandmother is deceased. She indicated that her paternal grandfather is deceased. She indicated that the status of her other is unknown.    SOCIAL HISTORY: She  reports that she has never smoked. She has never used smokeless tobacco. She reports that she does not drink alcohol or use drugs.  REVIEW OF SYSTEMS:   Unable to obtain since patient is groggy Denies chest pain, talking in monosyllables   VITAL SIGNS: BP (!) 89/63   Pulse (!) 54   Temp (!) 97.5 F (36.4 C)   Resp 20   Ht 5' (1.524 m)   Wt 182 lb (82.6 kg)   SpO2 (!) 84%   BMI  35.54 kg/m   HEMODYNAMICS:    VENTILATOR SETTINGS:    INTAKE / OUTPUT: No intake/output data recorded.  PHYSICAL EXAMINATION: General:  Elderly, chronically ill-appearing, no respiratory distress Neuro:  Awake, follows one-step commands, moves all 4 extremities HEENT:  Pale, no icterus, no JVD Cardiovascular:  S1-S2 normal, no rub, no S3, fluid thrill left arm fistula Lungs:  Decreased breath sounds bilateral Abdomen:  Soft nontender abdomen, both lower quadrants have ostomies Musculoskeletal:  1+ bipedal edema, right foot amputation Skin:  Dark, no breakdown  LABS:  BMET No results for input(s): NA, K, CL, CO2, BUN, CREATININE, GLUCOSE in the last 168 hours.  Electrolytes No results for input(s): CALCIUM, MG, PHOS in the last 168 hours.  CBC No results for input(s): WBC, HGB, HCT, PLT in the last 168 hours.  Coag's No results for input(s): APTT, INR in the last 168 hours.  Sepsis Markers No results for input(s): LATICACIDVEN, PROCALCITON, O2SATVEN in the last 168 hours.  ABG No results for input(s): PHART, PCO2ART, PO2ART in the last 168 hours.  Liver Enzymes No results for input(s): AST, ALT, ALKPHOS, BILITOT, ALBUMIN in the last 168 hours.  Cardiac Enzymes No results for input(s): TROPONINI, PROBNP in the last 168 hours.  Glucose  Recent Labs Lab 08/17/16 0936  GLUCAP 100*    Imaging No results found.   STUDIES:  Echo 07/2015 >> EF 25-30%, diffuse hypokinesis, dilated left atrium  CULTURES:   ANTIBIOTICS:   SIGNIFICANT EVENTS: 8/9 admission due to PEA, brief towards end of upper endoscopy  LINES/TUBES:   DISCUSSION: No clear etiology for brief PEA  arrest, we will have to look at cardiac ischemia. She does not appear obviously fluid overloaded. EKG shows baseline left bundle branch block  ASSESSMENT / PLAN:  PULMONARY A: No issues P:   Does not seem to require ventilation Oxygen as needed  CARDIOVASCULAR A:  PEA  arrest Ischemic cardiomyopathy LBBB Atrial tachycardia on amiodarone, status post ablation 11/2015 P:  Resume home meds when able including amiodarone and metoprolol Hypotension on dialysis limiting further therapy ASA 81 daily Will ask cardiology to consult  RENAL A:   ESRD on HD P:   Renal consulted, will plan for dialysis on 8/10 unless numbers indicate need for emergent HD today Will need transferred to [facility] for that, we'll currently admit to rest the ICU since no beds available at [facility]  GASTROINTESTINAL A:   Hematemesis s/p EGD- esophagitis and duodenal polyp P:   Continue Protonix, home medicine  HEMATOLOGIC A:   No issues P:     ENDOCRINE A:   DM-2    P:   Hold oral agents SSI  NEUROLOGIC A:   Low risk anoxia since brief loss of pulse P:   No indication for hypothermia Avoid sedating agents   FAMILY  - Updates:   - Inter-disciplinary family meet or Palliative Care meeting due by: NA   Kara Mead MD. FCCP. [facility] Pager [phone] If no response call [phone]    08/17/2016, 11:30 AM

## 2016-08-17 NOTE — Op Note (Signed)
[facility] Patient Name: [name] Procedure Date: 08/17/2016 MRN: [id number] Attending MD: Gatha Mayer , MD Date of Birth: [birth year] CSN: [id number] Age: 71 Admit Type: Outpatient Procedure:                Upper GI endoscopy Indications:              Dysphagia, Hematemesis Providers:                Gatha Mayer, MD, Laverta Baltimore RN, RN, Elspeth Cho Tech., Technician, Virgia Land, CRNA Referring MD:              Medicines:                Propofol per Anesthesia, Monitored Anesthesia Care Complications:            Cardiopulmonary arrest Estimated Blood Loss:     Estimated blood loss was minimal. Procedure:                Pre-Anesthesia Assessment:                           - Prior to the procedure, a History and Physical                            was performed, and patient medications and                            allergies were reviewed. The patient's tolerance of                            previous anesthesia was also reviewed. The risks                            and benefits of the procedure and the sedation                            options and risks were discussed with the patient.                            All questions were answered, and informed consent                            was obtained. Prior Anticoagulants: The patient                            last took aspirin on the day of the procedure. ASA                            Grade Assessment: III - A patient with severe                            systemic disease. After reviewing the risks and  benefits, the patient was deemed in satisfactory                            condition to undergo the procedure.                           After obtaining informed consent, the endoscope was                            passed under direct vision. Throughout the                            procedure, the patient's blood pressure, pulse, and                           oxygen saturations were monitored continuously. The                            [id number] scope was introduced through the                            mouth, and advanced to the second part of duodenum.                            The upper GI endoscopy was accomplished without                            difficulty. The patient tolerated the procedure                            poorly due to the patient's cardiovascular                            instability (hypotension). Scope In: Scope Out: Findings:      LA Grade C (one or more mucosal breaks continuous between tops of 2 or       more mucosal folds, less than 75% circumference) esophagitis with no       bleeding was found in the lower third of the esophagus. Biopsies were       taken with a cold forceps for histology. Verification of patient       identification for the specimen was done. Estimated blood loss was       minimal.      Diffuse moderate inflammation characterized by congestion (edema) and       erythema was found in the gastric antrum.      A single 10 mm sessile polyp was found in the second portion of the       duodenum. The polyp was removed with a hot snare. Resection and       retrieval were complete. Verification of patient identification for the       specimen was done. Estimated blood loss: none.      The exam was otherwise without abnormality.      The cardia and gastric fundus were normal on retroflexion. Impression:               - LA Grade  C reflux esophagitis. Biopsied.                           - Gastritis.                           - A single duodenal polyp. Resected and retrieved.                           - The examination was otherwise normal. Moderate Sedation:      N/A- Per Anesthesia Care Recommendation:           - Admit the patient for observation.                           - Clear liquid diet.                           - Continue present medications.                            - Await pathology results.                           - Had 30-60 sec of what looked like PEA.                           chest compressions briefly and then carotid pulse                            felt.                           she regained spontaneous circulation and                            respiration, had neo-synephrine, recovered mental                            status.                           Daughter updated.                           Critical care will admit.                           Do not stop ASA at this time Procedure Code(s):        --- Professional ---                           [phone], Esophagogastroduodenoscopy, flexible,                            transoral; with removal of tumor(s), polyp(s), or                            other lesion(s) by  snare technique                           43239, 59, Esophagogastroduodenoscopy, flexible,                            transoral; with biopsy, single or multiple Diagnosis Code(s):        --- Professional ---                           K21.0, Gastro-esophageal reflux disease with                            esophagitis                           K29.70, Gastritis, unspecified, without bleeding                           K31.7, Polyp of stomach and duodenum                           R13.10, Dysphagia, unspecified                           K92.0, Hematemesis CPT copyright 2016 [employer]. All rights reserved. The codes documented in this report are preliminary and upon coder review may  be revised to meet current compliance requirements. Gatha Mayer, MD 08/17/2016 11:04:55 AM This report has been signed electronically. Number of Addenda: 0

## 2016-08-17 NOTE — Anesthesia Preprocedure Evaluation (Addendum)
Anesthesia Evaluation  Patient identified by MRN, date of birth, ID band Patient awake    Reviewed: Allergy & Precautions, NPO status   Airway Mallampati: II  TM Distance: >3 FB Neck ROM: Full    Dental  (+) Poor Dentition   Pulmonary shortness of breath,    breath sounds clear to auscultation       Cardiovascular hypertension, + CAD, + Past MI and +CHF  + dysrhythmias  Rhythm:Regular Rate:Normal + Systolic murmurs Reduced EF   Neuro/Psych    GI/Hepatic GERD  ,  Endo/Other  diabetes  Renal/GU Dialysis and ESRFRenal disease     Musculoskeletal   Abdominal   Peds  Hematology  (+) anemia ,   Anesthesia Other Findings   Reproductive/Obstetrics                             Anesthesia Physical Anesthesia Plan  ASA: IV  Anesthesia Plan: MAC   Post-op Pain Management:    Induction:   PONV Risk Score and Plan: 2 and Ondansetron and Dexamethasone  Airway Management Planned: Natural Airway and Nasal Cannula  Additional Equipment:   Intra-op Plan:   Post-operative Plan:   Informed Consent: I have reviewed the patients History and Physical, chart, labs and discussed the procedure including the risks, benefits and alternatives for the proposed anesthesia with the patient or authorized representative who has indicated his/her understanding and acceptance.   Dental advisory given  Plan Discussed with: CRNA  Anesthesia Plan Comments:         Anesthesia Quick Evaluation

## 2016-08-17 NOTE — Progress Notes (Signed)
Shortly after the scope was withdrawn the patient lost BP and did not breathe spontaneously. She was supported with mask ventilation and we briefly did chest compressions x 30 sec as no carotid pulse. Carotid pulse spontaneously returned. The EKG monitor showed normal sinus rhythm throughout. Anesthesia staff gave pressors and breathing was supported and after several minutes she became responsive to physical and verbal stimuli. Dr. Elsworth Soho evaluating the patient and will admit.

## 2016-08-17 NOTE — Progress Notes (Signed)
At time of "scope out" pt. Went into PEA arrest.  Compressions started by RN at 57, Dr. Carlean Purl present, Dr. Orene Desanctis called to bedside, CRNA present and managing airway.  Code blue called.  At 10:30, pulse check done on pt. And found and present.  Dr. Carlean Purl spoke with family.  Report given to PACU.  Pt. Transferred to PACU by RN and CRNA while on monitor.    Laverta Baltimore, RN

## 2016-08-17 NOTE — Consult Note (Signed)
Renal Service Consult Note [place] 08/17/2016 Sol Blazing Requesting Physician:  Dr Elsworth Soho  Reason for Consult:  ESRD pt w/ PEA arrest HPI: The patient is a 71 y.o. year-old with hx of HTN, DM2, ESRD on HD, CM EF 35%, PAD, GIB, MI/ CAD w/ hx stenting presented w/ dysphagia and was set up to have an EGD done today as an outpatient.  Towards the end of the procedure pt became unresponsive, pulseless and required brief CPR.  EGK showed esophagitis and gastritis. Pt was quickly resuscitated and is awake and alert now.  No mention of arrhythmia.  Asked to see for dialysis.   Patient is alert and w/o complaints.  BP's are soft.  No SOB, CP, no abd pain, no joint pain, fevers, chills or sweats. No palpitations.   Home meds: - amiodarone / metoprolol / SL NTG / zocor / asa - phoslo ac tid - repaglinide 0.5mg  tid ac  - advair / protonix / eye drops    Old chart: 2010 - CKD IV, HL, HTN, DM2, CHD / vol overload 2011 - acute CVA  w ataxia/ gen'd weakness; ESRD on HD, ICM, atrial tach, colon Ca '86 2012 - sepsis / gangrene R foot, had revasc angioplasty to RLE and amputation of toe 2013 - AMS/ MRSA bacteremia, UTI, ESRD, LBBB, DM, CAD, EF 25% 2015 - acute GIB due to antral erosion; NSTEMI, acute AMS, CAD, ESRD on HD. DM1, CM EF 25%, hx TMA foot 2016 - NSTEMI/ heart cath with DES to 95% stenosed 1st diagonal.  Prox RCA was 95% stenosed but couldn't be stented.  EF 35%.   2017 - ablation for atrial tachycardia; ESRD on HD. Stallings home.    ROS  denies CP  no joint pain   no HA  no blurry vision  no rash  no diarrhea  no nausea/ vomiting  Past Medical History  Past Medical History:  Diagnosis Date  . Acute respiratory failure with hypoxia ([place]) 10/23/2014  . Anemia   . Arthritis    "hands" (11/17/2015)  . Atrial tachycardia ([place])    on amiod  . Cardiomyopathy- mixed   . CHF (congestive heart failure) ([place])   . Chronic diastolic heart failure ([place])    . Colon cancer ([place]) 1986  . Colostomy in place [facility])   . Coronary artery disease    Previously decreased EF; echo 113 normal LV function  . Dialysis patient ([place])   . ESRD (end stage renal disease) on dialysis [facility])    Dr Dunham/Dr. Lyda Kalata.  M, W, Fr; [place] (11/17/2015)  . GERD (gastroesophageal reflux disease)   . Glaucoma   . Heart murmur   . Hernia, incisional    abd  . Hyperlipidemia   . Hypertension   . Hypertensive heart disease    sees Dr. Alain Marion  . LBBB (left bundle branch block)   . Mitral valve insufficiency and aortic valve insufficiency   . Obesity   . Stroke [facility])    2011/12  . Type II diabetes mellitus ([place])    Past Surgical History  Past Surgical History:  Procedure Laterality Date  . ARTERIOVENOUS GRAFT PLACEMENT Left 2010   "forearm"  . CARDIAC CATHETERIZATION    . CARDIAC CATHETERIZATION N/A 10/27/2014   Procedure: Left Heart Cath and Coronary Angiography;  Surgeon: Leonie Man, MD;  Location: [place] CV LAB;  Service: Cardiovascular;  Laterality: N/A;  . CARDIAC CATHETERIZATION N/A 10/28/2014   Procedure: Coronary Stent  Intervention;  Surgeon: Peter M Martinique, MD;  Location: [place] CV LAB;  Service: Cardiovascular;  Laterality: N/A;  . CARDIAC CATHETERIZATION Bilateral 12/2008   R. heart cath showed elevated left and right heart filling pressures w/ pulmonary artery pressure elevated mildly out of proportion to the wedge. The left heart cath showed diffuse distal vessel disease as well as a 75% stenosis in the mid circumflex w/ a 90% stenosis of the ostial first obtuse marginal. These lesions were in close proximity. there was a 60-70%mild RCA stenosis.   [name] CATARACT EXTRACTION W/ INTRAOCULAR LENS  IMPLANT, BILATERAL Bilateral   . COLON SURGERY    . COLONOSCOPY    . COLOSTOMY  1986  . ELECTROCARDIOGRAM  04-27-06  . ELECTROPHYSIOLOGIC STUDY N/A 11/17/2015   Procedure: A-Tach Ablation;  Surgeon: Will Meredith Leeds, MD;  Location: [place] CV LAB;  Service: Cardiovascular;  Laterality: N/A;  . ESOPHAGOGASTRODUODENOSCOPY  03-18-04  . ESOPHAGOGASTRODUODENOSCOPY N/A 02/02/2013   Procedure: ESOPHAGOGASTRODUODENOSCOPY (EGD);  Surgeon: Irene Shipper, MD;  Location: [facility] ENDOSCOPY;  Service: Endoscopy;  Laterality: N/A;  . EYE SURGERY    . FOOT AMPUTATION THROUGH METATARSAL  10-07-10   Right foot transmetatarsal  . INSERTION OF AHMED VALVE Right 08/20/2013   Procedure: INSERTION OF AHMED VALVE WITH Mitomycin C application;  Surgeon: Marylynn Pearson, MD;  Location: [place];  Service: Ophthalmology;  Laterality: Right;  . INSERTION OF AHMED VALVE Left 07/22/2014   Procedure: INSERTION OF AHMED VALVE WITH [place];  Surgeon: Marylynn Pearson, MD;  Location: [place];  Service: Ophthalmology;  Laterality: Left;  . MITOMYCIN C APPLICATION Left 0/93/2671   Procedure: MITOMYCIN C APPLICATION;  Surgeon: Marylynn Pearson, MD;  Location: [place];  Service: Ophthalmology;  Laterality: Left;  . PARS PLANA VITRECTOMY  11/29/2011   Procedure: PARS PLANA VITRECTOMY WITH 23 GAUGE;  Surgeon: Adonis Brook, MD;  Location: [place];  Service: Ophthalmology;  Laterality: Right;  Right Eye 23 ga vitrectomy with membrane peel  . PARS PLANA VITRECTOMY Left 02/28/2012   Procedure: PARS PLANA VITRECTOMY WITH 23 GAUGE;  Surgeon: Adonis Brook, MD;  Location: [place];  Service: Ophthalmology;  Laterality: Left;  . REVISION UROSTOMY CUTANEOUS    . SUPRAVENTRICULAR TACHYCARDIA ABLATION  11/17/2015  . VAGINAL HYSTERECTOMY     Family History  Family History  Problem Relation Age of Onset  . Hypertension Mother   . Coronary artery disease Mother   . Hypertension Father   . Diabetes Father   . Cancer Sister        colon  . Hypertension Other    Social History  reports that she has never smoked. She has never used smokeless tobacco. She reports that she does not drink alcohol or use drugs. Allergies  Allergies  Allergen Reactions  . Ace Inhibitors Cough  . Eggs Or  Egg-Derived Products Nausea And Vomiting  . Enalapril Cough  . Lisinopril Cough  . Omnipaque [Iohexol] Hives   Home medications Prior to Admission medications   Medication Sig Start Date End Date Taking? Authorizing Provider  acetaminophen (TYLENOL) 500 MG tablet Take 500-1,000 mg by mouth every 6 (six) hours as needed (pain).    Yes [provider]  amiodarone (PACERONE) 200 MG tablet Take 1 tablet (200 mg total) by mouth daily. 08/10/15  Yes Richardson Dopp T, PA-C  aspirin 81 MG tablet Take 1 tablet (81 mg total) by mouth daily. 10/29/14  Yes Dhungel, Nishant, MD  calcium acetate (PHOSLO) 667 MG capsule Take 2,458-0,998  mg by mouth See admin instructions. Take 2001mg  three times daily with meals and Take 1334mg   with snacks.   Yes [provider]  dorzolamide-timolol (COSOPT) 22.3-6.8 MG/ML ophthalmic solution Place 1 drop into the left eye 2 (two) times daily. 11/09/15  Yes [provider]  Fluticasone-Salmeterol (ADVAIR) 250-50 MCG/DOSE AEPB Inhale 1 puff into the lungs daily as needed (shortness of breath).  05/28/12  Yes Plotnikov, Evie Lacks, MD  metoprolol (LOPRESSOR) 50 MG tablet Take 1 tablet (50 mg total) by mouth 2 (two) times daily. Do not take this medication on dialysis days (Monday, Wednesday, Friday). Patient taking differently: Take 50 mg by mouth every Tuesday, Thursday, Saturday, and Sunday. IN THE MORNING ONLY. Do not take this medication on dialysis days (Monday, Wednesday, Friday). 01/18/16  Yes Plotnikov, Evie Lacks, MD  repaglinide (PRANDIN) 0.5 MG tablet TAKE 1 TABLET BY MOUTH THREE TIMES DAILY BEFORE MEALS 03/16/16  Yes Renato Shin, MD  simvastatin (ZOCOR) 10 MG tablet Take 1 tablet (10 mg total) by mouth every evening. 05/23/16  Yes Camnitz, Will Hassell Done, MD  glucose blood (ONE TOUCH ULTRA TEST) test strip 1 each by Other route daily. And lancets 1/day 03/16/16   Renato Shin, MD  nitroGLYCERIN (NITROSTAT) 0.4 MG SL tablet Place 1 tablet (0.4 mg total)  under the tongue every 5 (five) minutes as needed for chest pain. 10/29/14   Dhungel, Nishant, MD  ondansetron (ZOFRAN) 4 MG tablet Take 1 tablet (4 mg total) by mouth every 8 (eight) hours as needed for nausea or vomiting. Patient not taking: Reported on 08/15/2016 07/25/16   Gatha Mayer, MD  pantoprazole (PROTONIX) 40 MG tablet Take 1 tablet (40 mg total) by mouth daily before breakfast. Patient not taking: Reported on 08/15/2016 07/25/16   Gatha Mayer, MD   Liver Function Tests No results for input(s): AST, ALT, ALKPHOS, BILITOT, PROT, ALBUMIN in the last 168 hours. No results for input(s): LIPASE, AMYLASE in the last 168 hours. CBC  Recent Labs Lab 08/17/16 1140  WBC 4.0  HGB 10.6*  HCT 33.1*  MCV 88.3  PLT 914   Basic Metabolic Panel  Recent Labs Lab 08/17/16 1140  NA 140  K 2.9*  CL 95*  CO2 34*  GLUCOSE 111*  BUN 19  CREATININE 3.65*  CALCIUM 8.3*   Iron/TIBC/Ferritin/ %Sat    Component Value Date/Time   IRON 16 (L) 05/21/2011 0715   TIBC 122 (L) 05/21/2011 0715   FERRITIN 2,174 (H) 05/21/2011 0715   IRONPCTSAT 13 (L) 05/21/2011 0715    Vitals:   08/17/16 1250 08/17/16 1255 08/17/16 1300 08/17/16 1305  BP: (!) 78/58 (!) 91/52 (!) 127/57 (!) 116/47  Pulse: (!) 46 (!) 54 81 (!) 56  Resp: (!) 25 (!) 27 11 20   Temp:      TempSrc:      SpO2: 92% 94%  94%  Weight:      Height:       Exam Gen no distress No rash, cyanosis or gangrene Sclera anicteric, throat clear  No jvd or bruits Chest clear bilat RRR no MRG Abd soft ntnd no mass or ascites +bs obese GU defer MS no joint effusions or deformity Ext no LE edema / no wounds or ulcers Neuro is alert, Ox 3 , nf  Home meds: - amiodarone / metoprolol / SL NTG / zocor / asa - phoslo ac tid - repaglinide 0.5mg  tid ac  - advair / protonix / eye drops  CXR - no active  disease  Dialysis: [facility]  4h  75kg  2/2.25 bath  L arm AVF  Hep 3000  - no ESA - calcitriol 1 ug tiw    Impression: 1  SP  cardiac arrest - at end of EGD done today for dysphagia. Brief arrest , responded to CPR x 30 sec and regained pulses.  No loss of rhythm.   2  ESRD on HD MWF 3  Atrial tachycardia/ hx ICM/ LBBB - sp ablation 2017, on amio/ BB. Hypotension w/ HD limits further medical Rx 4  Volume - CXR clear, lungs clear, no edema on exam. Up 7kg by wts, doubt accuracy.  5  Dysphagia - sp EGD today, +esophagitis and gastritis.  6  CAD hx PCI  7  DM2 - per primary  Plan - HD tomorrow, will need ambulance transfer to [facility] for [facility].    Kelly Splinter MD [employer] pager [phone]   08/17/2016, 2:48 PM

## 2016-08-18 ENCOUNTER — Encounter (HOSPITAL_COMMUNITY)
Admission: RE | Disposition: A | Discharge status: Home / Self Care | Payer: Self-pay | Source: Ambulatory Visit | Attending: Pulmonary Disease

## 2016-08-18 DIAGNOSIS — K920 Hematemesis: Secondary | ICD-10-CM

## 2016-08-18 DIAGNOSIS — I2510 Atherosclerotic heart disease of native coronary artery without angina pectoris: Secondary | ICD-10-CM

## 2016-08-18 DIAGNOSIS — Z955 Presence of coronary angioplasty implant and graft: Secondary | ICD-10-CM

## 2016-08-18 HISTORY — PX: LEFT HEART CATH AND CORONARY ANGIOGRAPHY: CATH118249

## 2016-08-18 LAB — GLUCOSE, CAPILLARY
GLUCOSE-CAPILLARY: 73 mg/dL (ref 65–99)
GLUCOSE-CAPILLARY: 53 mg/dL — AB (ref 65–99)
GLUCOSE-CAPILLARY: 116 mg/dL — AB (ref 65–99)
GLUCOSE-CAPILLARY: 82 mg/dL (ref 65–99)
GLUCOSE-CAPILLARY: 180 mg/dL — AB (ref 65–99)
Glucose-Capillary: 70 mg/dL (ref 65–99)
Glucose-Capillary: 71 mg/dL (ref 65–99)
Glucose-Capillary: 91 mg/dL (ref 65–99)
Glucose-Capillary: 139 mg/dL — ABNORMAL HIGH (ref 65–99)

## 2016-08-18 LAB — BASIC METABOLIC PANEL
ANION GAP: 11 (ref 5–15)
BUN: 22 mg/dL — ABNORMAL HIGH (ref 6–20)
CHLORIDE: 98 mmol/L — AB (ref 101–111)
CO2: 33 mmol/L — AB (ref 22–32)
Calcium: 8.4 mg/dL — ABNORMAL LOW (ref 8.9–10.3)
Creatinine, Ser: 4.08 mg/dL — ABNORMAL HIGH (ref 0.44–1.00)
GFR calc non Af Amer: 10 mL/min — ABNORMAL LOW (ref >60)
GFR, EST AFRICAN AMERICAN: 12 mL/min — AB (ref >60)
Glucose, Bld: 73 mg/dL (ref 65–99)
POTASSIUM: 3.1 mmol/L — AB (ref 3.5–5.1)
Sodium: 142 mmol/L (ref 135–145)

## 2016-08-18 LAB — CBC
HEMATOCRIT: 37.2 % (ref 36.0–46.0)
HEMOGLOBIN: 11.9 g/dL — AB (ref 12.0–15.0)
MCH: 28.2 pg (ref 26.0–34.0)
MCHC: 32 g/dL (ref 30.0–36.0)
MCV: 88.2 fL (ref 78.0–100.0)
Platelets: 156 10*3/uL (ref 150–400)
RBC: 4.22 MIL/uL (ref 3.87–5.11)
RDW: 16.2 % — ABNORMAL HIGH (ref 11.5–15.5)
WBC: 6.1 10*3/uL (ref 4.0–10.5)

## 2016-08-18 LAB — LIPID PANEL
Cholesterol: 117 mg/dL (ref 0–200)
HDL: 39 mg/dL — ABNORMAL LOW (ref >40)
LDL CALC: 65 mg/dL (ref 0–99)
Total CHOL/HDL Ratio: 3 RATIO
Triglycerides: 64 mg/dL (ref <150)
VLDL: 13 mg/dL (ref 0–40)

## 2016-08-18 LAB — CREATININE, SERUM
Creatinine, Ser: 4.85 mg/dL — ABNORMAL HIGH (ref 0.44–1.00)
GFR calc Af Amer: 10 mL/min — ABNORMAL LOW (ref >60)
GFR, EST NON AFRICAN AMERICAN: 8 mL/min — AB (ref >60)

## 2016-08-18 LAB — POCT ACTIVATED CLOTTING TIME: Activated Clotting Time: 125 seconds

## 2016-08-18 LAB — PROTIME-INR
INR: 1.13
Prothrombin Time: 14.6 seconds (ref 11.4–15.2)

## 2016-08-18 SURGERY — LEFT HEART CATH AND CORONARY ANGIOGRAPHY
Anesthesia: LOCAL

## 2016-08-18 MED ORDER — IOPAMIDOL (ISOVUE-370) INJECTION 76%
INTRAVENOUS | Status: AC
  Filled 2016-08-18: qty 100

## 2016-08-18 MED ORDER — IOPAMIDOL (ISOVUE-370) INJECTION 76%
INTRAVENOUS | Status: DC | PRN
  Administered 2016-08-18: 45 mL via INTRA_ARTERIAL

## 2016-08-18 MED ORDER — FENTANYL CITRATE (PF) 100 MCG/2ML IJ SOLN
INTRAMUSCULAR | Status: AC
  Filled 2016-08-18: qty 2

## 2016-08-18 MED ORDER — POTASSIUM CHLORIDE CRYS ER 20 MEQ PO TBCR
40.0000 meq | EXTENDED_RELEASE_TABLET | Freq: Once | ORAL | Status: DC
Start: 2016-08-18 — End: 2016-08-18

## 2016-08-18 MED ORDER — FAMOTIDINE IN NACL 20-0.9 MG/50ML-% IV SOLN
INTRAVENOUS | Status: AC | PRN
  Administered 2016-08-18: 20 mg via INTRAVENOUS

## 2016-08-18 MED ORDER — NALOXONE HCL 0.4 MG/ML IJ SOLN
INTRAMUSCULAR | Status: DC | PRN
  Administered 2016-08-18: 0.2 mg via INTRAVENOUS

## 2016-08-18 MED ORDER — SUCRALFATE 1 GM/10ML PO SUSP
1.0000 g | Freq: Three times a day (TID) | ORAL | Status: DC
  Administered 2016-08-18 – 2016-08-20 (×4): 1 g via ORAL
  Filled 2016-08-18 (×8): qty 10

## 2016-08-18 MED ORDER — SODIUM CHLORIDE 0.9 % IV SOLN
250.0000 mL | INTRAVENOUS | Status: DC | PRN
  Administered 2016-08-18: 250 mL via INTRAVENOUS

## 2016-08-18 MED ORDER — METHYLPREDNISOLONE SODIUM SUCC 125 MG IJ SOLR
INTRAMUSCULAR | Status: DC | PRN
  Administered 2016-08-18: 125 mg via INTRAVENOUS

## 2016-08-18 MED ORDER — MIDAZOLAM HCL 2 MG/2ML IJ SOLN
INTRAMUSCULAR | Status: DC | PRN
  Administered 2016-08-18: 1 mg via INTRAVENOUS

## 2016-08-18 MED ORDER — METHYLPREDNISOLONE SODIUM SUCC 125 MG IJ SOLR
INTRAMUSCULAR | Status: AC
  Filled 2016-08-18: qty 2

## 2016-08-18 MED ORDER — DIPHENHYDRAMINE HCL 50 MG/ML IJ SOLN
INTRAMUSCULAR | Status: AC
  Filled 2016-08-18: qty 1

## 2016-08-18 MED ORDER — POTASSIUM CHLORIDE CRYS ER 20 MEQ PO TBCR
60.0000 meq | EXTENDED_RELEASE_TABLET | Freq: Once | ORAL | Status: AC
  Administered 2016-08-18: 60 meq via ORAL
  Filled 2016-08-18: qty 3

## 2016-08-18 MED ORDER — FLUMAZENIL 1 MG/10ML IV SOLN
INTRAVENOUS | Status: DC | PRN
  Administered 2016-08-18: .8 mg via INTRAVENOUS

## 2016-08-18 MED ORDER — FLUMAZENIL 1 MG/10ML IV SOLN
INTRAVENOUS | Status: AC
  Filled 2016-08-18: qty 10

## 2016-08-18 MED ORDER — DEXTROSE 50 % IV SOLN
INTRAVENOUS | Status: AC
  Filled 2016-08-18: qty 50

## 2016-08-18 MED ORDER — FAMOTIDINE IN NACL 20-0.9 MG/50ML-% IV SOLN
INTRAVENOUS | Status: AC
  Filled 2016-08-18: qty 50

## 2016-08-18 MED ORDER — LIDOCAINE HCL (PF) 1 % IJ SOLN
INTRAMUSCULAR | Status: DC | PRN
  Administered 2016-08-18: 15 mL

## 2016-08-18 MED ORDER — DEXTROSE 50 % IV SOLN
INTRAVENOUS | Status: AC
  Administered 2016-08-18: 25 mL
  Filled 2016-08-18: qty 50

## 2016-08-18 MED ORDER — FENTANYL CITRATE (PF) 100 MCG/2ML IJ SOLN
INTRAMUSCULAR | Status: DC | PRN
  Administered 2016-08-18: 25 ug via INTRAVENOUS

## 2016-08-18 MED ORDER — HEPARIN (PORCINE) IN NACL 2-0.9 UNIT/ML-% IJ SOLN
INTRAMUSCULAR | Status: AC
  Filled 2016-08-18: qty 1000

## 2016-08-18 MED ORDER — NALOXONE HCL 0.4 MG/ML IJ SOLN
INTRAMUSCULAR | Status: AC
  Filled 2016-08-18: qty 1

## 2016-08-18 MED ORDER — DIPHENHYDRAMINE HCL 50 MG/ML IJ SOLN
INTRAMUSCULAR | Status: DC | PRN
  Administered 2016-08-18: 25 mg via INTRAVENOUS

## 2016-08-18 MED ORDER — SIMVASTATIN 20 MG PO TABS
10.0000 mg | ORAL_TABLET | Freq: Every day | ORAL | Status: DC
  Administered 2016-08-19: 10 mg via ORAL
  Filled 2016-08-18: qty 1

## 2016-08-18 MED ORDER — SODIUM CHLORIDE 0.9% FLUSH
3.0000 mL | Freq: Two times a day (BID) | INTRAVENOUS | Status: DC
  Administered 2016-08-19 – 2016-08-20 (×2): 3 mL via INTRAVENOUS

## 2016-08-18 MED ORDER — HEPARIN (PORCINE) IN NACL 2-0.9 UNIT/ML-% IJ SOLN
INTRAMUSCULAR | Status: AC | PRN
  Administered 2016-08-18: 1000 mL

## 2016-08-18 MED ORDER — MIDAZOLAM HCL 2 MG/2ML IJ SOLN
INTRAMUSCULAR | Status: AC
  Filled 2016-08-18: qty 2

## 2016-08-18 MED ORDER — DEXTROSE 50 % IV SOLN
25.0000 mL | Freq: Once | INTRAVENOUS | Status: AC
  Administered 2016-08-18: 25 mL via INTRAVENOUS

## 2016-08-18 MED ORDER — SODIUM CHLORIDE 0.9% FLUSH: 3.0000 mL | INTRAVENOUS | Status: DC | PRN

## 2016-08-18 MED ORDER — LIDOCAINE HCL (PF) 1 % IJ SOLN
INTRAMUSCULAR | Status: AC
  Filled 2016-08-18: qty 30

## 2016-08-18 MED ORDER — HEPARIN SODIUM (PORCINE) 5000 UNIT/ML IJ SOLN
5000.0000 [IU] | Freq: Three times a day (TID) | INTRAMUSCULAR | Status: DC
  Administered 2016-08-19 (×2): 5000 [IU] via SUBCUTANEOUS
  Filled 2016-08-18: qty 1

## 2016-08-18 SURGICAL SUPPLY — 6 items
CATH INFINITI 5FR MULTPACK ANG (CATHETERS) ×2 IMPLANT
KIT HEART LEFT (KITS) ×2 IMPLANT
PACK CARDIAC CATHETERIZATION (CUSTOM PROCEDURE TRAY) ×2 IMPLANT
SHEATH PINNACLE 5F 10CM (SHEATH) ×2 IMPLANT
TRANSDUCER W/STOPCOCK (MISCELLANEOUS) ×2 IMPLANT
WIRE EMERALD 3MM-J .035X150CM (WIRE) ×2 IMPLANT

## 2016-08-18 NOTE — Interval H&P Note (Signed)
History and Physical Interval Note:  08/18/2016 4:26 PM  [name]  has presented today for surgery, with the diagnosis of cp  The various methods of treatment have been discussed with the patient and family. After consideration of risks, benefits and other options for treatment, the patient has consented to  Procedure(s): LEFT HEART CATH AND CORONARY ANGIOGRAPHY (N/A) as a surgical intervention .  The patient's history has been reviewed, patient examined, no change in status, stable for surgery.  I have reviewed the patient's chart and labs.  Questions were answered to the patient's satisfaction.   Cath Lab Visit (complete for each Cath Lab visit)  Clinical Evaluation Leading to the Procedure:   ACS: No.  Non-ACS:    Anginal Classification: CCS III  Anti-ischemic medical therapy: Minimal Therapy (1 class of medications)  Non-Invasive Test Results: No non-invasive testing performed  Prior CABG: No previous CABG        [name] [facility] 08/18/2016 4:26 PM

## 2016-08-18 NOTE — Progress Notes (Signed)
PULMONARY / CRITICAL CARE MEDICINE   Name: [name] MRN: [id number] DOB: [birth year]    ADMISSION DATE:  08/17/2016 CONSULTATION DATE:  08/18/2016  REFERRING MD:  Carlean Purl, GI  CHIEF COMPLAINT:  Cardiac arrest in endoscopy  Subjective Denies pain or dyspnea   Objective   VITAL SIGNS: BP (!) 117/47   Pulse 87   Temp 97.6 F (36.4 C) (Oral)   Resp 20   Ht 5' (1.524 m)   Wt 178 lb 2.1 oz (80.8 kg)   SpO2 (!) 88%   BMI 34.79 kg/m   HEMODYNAMICS:    VENTILATOR SETTINGS:    INTAKE / OUTPUT: I/O last 3 completed shifts: In: 661.9 [P.O.:240; I.V.:421.9] Out: 70 [Urine:20; Stool:50]  PHYSICAL EXAMINATION: General appearance:  71 Year old female, well nourished NAD,conversant  Eyes: anicteric sclerae, moist conjunctivae; PERRL, EOMI bilaterally. Neck: Trachea midline; neck supple, no JVD Lungs/chest: CTA, with normal respiratory effort and no intercostal retractions CV: RRR, no MRGs  Abdomen: Soft, non-tender; no masses or HSM Extremities: No peripheral edema; right Upper extremity AVF w/ excellent bruit and thrill Skin: Normal temperature, turgor and texture; no rash, ulcers or subcutaneous nodules Psych: Appropriate affect, alert and oriented to person, place and time   LABS:  BMET  Recent Labs Lab 08/17/16 1140 08/18/16 0649  NA 140 142  K 2.9* 3.1*  CL 95* 98*  CO2 34* 33*  BUN 19 22*  CREATININE 3.65* 4.08*  GLUCOSE 111* 73    Electrolytes  Recent Labs Lab 08/17/16 1140 08/18/16 0649  CALCIUM 8.3* 8.4*    CBC  Recent Labs Lab 08/17/16 1140  WBC 4.0  HGB 10.6*  HCT 33.1*  PLT 153    Coag's  Recent Labs Lab 08/18/16 0649  INR 1.13    Sepsis Markers  Recent Labs Lab 08/17/16 1140  LATICACIDVEN 1.5    ABG No results for input(s): PHART, PCO2ART, PO2ART in the last 168 hours.  Liver Enzymes No results for input(s): AST, ALT, ALKPHOS, BILITOT, ALBUMIN in the last 168 hours.  Cardiac Enzymes  Recent Labs Lab  08/17/16 1140  TROPONINI 0.03*    Glucose  Recent Labs Lab 08/17/16 1259 08/17/16 1517 08/17/16 1936 08/17/16 2347 08/18/16 0321 08/18/16 0635  GLUCAP 101* 132* 172* 89 70 73    Imaging Dg Chest Port 1 View  Result Date: 08/17/2016 CLINICAL DATA:  Acute respiratory distress during endoscopy EXAM: PORTABLE CHEST 1 VIEW COMPARISON:  07/23/2016 FINDINGS: Chronic cardiomegaly. Stable aortic contours. There is no edema, consolidation, effusion, or pneumothorax. No acute osseous finding. IMPRESSION: 1. Stable from prior.  No acute finding. 2. Chronic cardiomegaly. Electronically Signed   By: Monte Fantasia M.D.   On: 08/17/2016 11:49     STUDIES:  Echo 07/2015 >> EF 25-30%, diffuse hypokinesis, dilated left atrium  CULTURES:   ANTIBIOTICS:   SIGNIFICANT EVENTS: 8/9 admission due to PEA, brief towards end of upper endoscopy  LINES/TUBES:   DISCUSSION:  Has marked h/o cardiac disease. Suspect that procedure presented enough demand to trigger ischemia.  ->for left heart cath today; followed by HD  ASSESSMENT / PLAN:   CARDIOVASCULAR A:  PEA arrest (30-60 seconds) Ischemic cardiomyopathy EF 25-30% LBBB Atrial tachycardia on amiodarone, status post ablation 11/2015 Hypotension on dialysis limiting further therapy Evaluated by cards.  P: For left heart cath and echo ASA   RENAL A:   ESRD on HD Hypokalemia  Renal consulted.  P:   For transfer to [facility] and HD today after cardiac cath  Replace K  GASTROINTESTINAL A:   Hematemesis s/p EGD- esophagitis and duodenal polyp P:   Cont PPI and home meds F/u polyp path  Cl liq diet   HEMATOLOGIC A:   Normocytic anemia/AOCD; c/b Acute UGIB P:  Trend CBCs No AC Transfuse per protocol    ENDOCRINE A:   DM-2    P:   Hold oral agents cbgs and ssi    FAMILY  - Updates:   - Inter-disciplinary family meet or Palliative Care meeting due by: NA  Erick Colace ACNP-BC [place] Pager # [phone] OR # [phone] if no answer   08/18/2016, 8:11 AM

## 2016-08-18 NOTE — Progress Notes (Signed)
Progress Note  Patient Name: [name] Date of Encounter: 08/18/2016  Primary Cardiologist: Dr. Ellyn Hack  Subjective   Feeling well.  Breathing feels better today.  Inpatient Medications    Scheduled Meds: . amiodarone  200 mg Oral Daily  . aspirin  81 mg Oral Daily  . Chlorhexidine Gluconate Cloth  6 each Topical Q0600  . dextrose      . heparin  5,000 Units Subcutaneous Q8H  . insulin aspart  0-9 Units Subcutaneous Q4H  . metoprolol tartrate  50 mg Oral Q T,Th,S,Su  . mupirocin ointment  1 application Nasal BID  . pantoprazole  40 mg Oral Daily  . potassium chloride  60 mEq Oral Once   Continuous Infusions: . sodium chloride    . sodium chloride    . sodium chloride 10 mL/hr at 08/18/16 0600   PRN Meds: sodium chloride, sodium chloride, acetaminophen, ondansetron (ZOFRAN) IV   Vital Signs    Vitals:   08/18/16 0400 08/18/16 0436 08/18/16 0500 08/18/16 0600  BP: (!) 114/53   (!) 117/47  Pulse: 76  87   Resp: (!) 8  (!) 22 20  Temp:      TempSrc:      SpO2: 100%  (!) 88%   Weight:  80.8 kg (178 lb 2.1 oz)    Height:        Intake/Output Summary (Last 24 hours) at 08/18/16 0840 Last data filed at 08/18/16 0600  Gross per 24 hour  Intake           661.85 ml  Output               70 ml  Net           591.85 ml   Filed Weights   08/17/16 0925 08/17/16 1347 08/18/16 0436  Weight: 82.6 kg (182 lb) 78.6 kg (173 lb 4.5 oz) 80.8 kg (178 lb 2.1 oz)    Telemetry    Sinus rhythm.  First degree AV block - Personally Reviewed  ECG    n/a - Personally Reviewed  Physical Exam   VS:  BP (!) 117/47   Pulse 87   Temp 97.6 F (36.4 C) (Oral)   Resp 20   Ht 5' (1.524 m)   Wt 80.8 kg (178 lb 2.1 oz)   SpO2 (!) 88%   BMI 34.79 kg/m  , BMI Body mass index is 34.79 kg/m. GENERAL:  Well appearing.  No acute distress HEENT: Pupils equal round and reactive, fundi not visualized, oral mucosa unremarkable NECK:  No jugular venous distention, waveform within  normal limits, carotid upstroke brisk and symmetric, no bruits LUNGS:  Clear to auscultation bilaterally HEART:  RRR.  PMI not displaced or sustained,S1 and S2 within normal limits, no S3, no S4, no clicks, no rubs, no murmurs ABD:  Flat, positive bowel sounds normal in frequency in pitch, no bruits, no rebound, no guarding, no midline pulsatile mass, no hepatomegaly, no splenomegaly EXT: TMA.  L UE fistula SKIN:  No rashes no nodules.  Chronic stasis changes NEURO:  Cranial nerves II through XII grossly intact, motor grossly intact throughout [facility]:  Cognitively intact, oriented to person place and time   Labs    Chemistry Recent Labs Lab 08/17/16 1140 08/18/16 0649  NA 140 142  K 2.9* 3.1*  CL 95* 98*  CO2 34* 33*  GLUCOSE 111* 73  BUN 19 22*  CREATININE 3.65* 4.08*  CALCIUM 8.3* 8.4*  GFRNONAA 12* 10*  GFRAA 14* 12*  ANIONGAP 11 11     Hematology Recent Labs Lab 08/17/16 1140  WBC 4.0  RBC 3.75*  HGB 10.6*  HCT 33.1*  MCV 88.3  MCH 28.3  MCHC 32.0  RDW 16.3*  PLT 153    Cardiac Enzymes Recent Labs Lab 08/17/16 1140  TROPONINI 0.03*   No results for input(s): TROPIPOC in the last 168 hours.   BNPNo results for input(s): BNP, PROBNP in the last 168 hours.   DDimer No results for input(s): DDIMER in the last 168 hours.   Radiology    Dg Chest Port 1 View  Result Date: 08/17/2016 CLINICAL DATA:  Acute respiratory distress during endoscopy EXAM: PORTABLE CHEST 1 VIEW COMPARISON:  07/23/2016 FINDINGS: Chronic cardiomegaly. Stable aortic contours. There is no edema, consolidation, effusion, or pneumothorax. No acute osseous finding. IMPRESSION: 1. Stable from prior.  No acute finding. 2. Chronic cardiomegaly. Electronically Signed   By: Monte Fantasia M.D.   On: 08/17/2016 11:49    Cardiac Studies   Echo 08/03/15: Study Conclusions  - Left ventricle: The cavity size was normal. Wall thickness was   increased in a pattern of moderate LVH. Systolic  function was   severely reduced. The estimated ejection fraction was in the   range of 25% to 30%. Diffuse hypokinesis. - Ventricular septum: Septal motion showed abnormal function and   dyssynergy. - Aortic valve: There was mild regurgitation. - Mitral valve: Severely calcified annulus. Mildly thickened   leaflets . There was moderate regurgitation directed posteriorly. - Left atrium: The atrium was moderately dilated.   Anterior-posterior dimension: 51 mm. - Tricuspid valve: There was mild regurgitation.  Impressions:  - Compared to the prior study, there has been no significant   interval change.  LHC 10/27/14: Prox RCA to Mid RCA lesion, 95% stenosed -- new. Post Atrio lesion, 80% stenosed. Ost RPDA lesion, 100% stenosed - previously described. 1. Mid Cx lesion, 75% stenosed. Ost 2nd Mrg lesion, 80% stenosed. 2nd Mrg lesion, 60% stenosed. 2. Mid Cx lesion, 75% stenosed. 3. Prox LAD to Mid LAD lesion, 30% stenosed. Dist LAD lesion, 90% stenosed. 4. 1st Diag lesion, 95% stenosed. 5. Ost 2nd Diag to 2nd Diag lesion, 50% stenosed. 2nd Diag lesion, 60% stenosed. Lat 2nd Diag lesion, 80% stenosed. 6. There is moderate left ventricular systolic dysfunction.  Diagnostic Diagram       Patient Profile     89F with ESRD on HD, CAD s/p PCI, medically managed RCA and LAD disease, chronic systolic and diastolic heart failure LVEF 25-30%, and atrial tachycardia s/p ablation who had an episode of PEA arrest when the probe was being removed for EGD.  Assessment & Plan    # PEA Arrest: # Exertional dyspnea:  # CAD s/p PCI: Ms. Mcconico had a PEA arrest requiring a minute of CPR.  It is not clear that this was an ischemic event.  However, she does report exertional dyspnea for the last several weeks, which was her anginal equivalent.  Will plan for LHC today, given that a Lexiscan Myoview is undoubtedly going to show some level of ischemia.  Given her esophagitis on EGD, would only treat  high risk lesions.  Continue aspirin, metoprolol, and simvastatin. Check fasting lipids.   # Chronic systolic and diastolic heart failure:  Ms. Crutchfield is euvolemic. Volume management with HD.  # Atrial tachycardia: Continue amiodarone and metoprolol.Raeanne Barry, MD  08/18/2016, 8:40 AM

## 2016-08-18 NOTE — H&P (View-Only) (Signed)
Progress Note  Patient Name: [name] Date of Encounter: 08/18/2016  Primary Cardiologist: Dr. Ellyn Hack  Subjective   Feeling well.  Breathing feels better today.  Inpatient Medications    Scheduled Meds: . amiodarone  200 mg Oral Daily  . aspirin  81 mg Oral Daily  . Chlorhexidine Gluconate Cloth  6 each Topical Q0600  . dextrose      . heparin  5,000 Units Subcutaneous Q8H  . insulin aspart  0-9 Units Subcutaneous Q4H  . metoprolol tartrate  50 mg Oral Q T,Th,S,Su  . mupirocin ointment  1 application Nasal BID  . pantoprazole  40 mg Oral Daily  . potassium chloride  60 mEq Oral Once   Continuous Infusions: . sodium chloride    . sodium chloride    . sodium chloride 10 mL/hr at 08/18/16 0600   PRN Meds: sodium chloride, sodium chloride, acetaminophen, ondansetron (ZOFRAN) IV   Vital Signs    Vitals:   08/18/16 0400 08/18/16 0436 08/18/16 0500 08/18/16 0600  BP: (!) 114/53   (!) 117/47  Pulse: 76  87   Resp: (!) 8  (!) 22 20  Temp:      TempSrc:      SpO2: 100%  (!) 88%   Weight:  80.8 kg (178 lb 2.1 oz)    Height:        Intake/Output Summary (Last 24 hours) at 08/18/16 0840 Last data filed at 08/18/16 0600  Gross per 24 hour  Intake           661.85 ml  Output               70 ml  Net           591.85 ml   Filed Weights   08/17/16 0925 08/17/16 1347 08/18/16 0436  Weight: 82.6 kg (182 lb) 78.6 kg (173 lb 4.5 oz) 80.8 kg (178 lb 2.1 oz)    Telemetry    Sinus rhythm.  First degree AV block - Personally Reviewed  ECG    n/a - Personally Reviewed  Physical Exam   VS:  BP (!) 117/47   Pulse 87   Temp 97.6 F (36.4 C) (Oral)   Resp 20   Ht 5' (1.524 m)   Wt 80.8 kg (178 lb 2.1 oz)   SpO2 (!) 88%   BMI 34.79 kg/m  , BMI Body mass index is 34.79 kg/m. GENERAL:  Well appearing.  No acute distress HEENT: Pupils equal round and reactive, fundi not visualized, oral mucosa unremarkable NECK:  No jugular venous distention, waveform within  normal limits, carotid upstroke brisk and symmetric, no bruits LUNGS:  Clear to auscultation bilaterally HEART:  RRR.  PMI not displaced or sustained,S1 and S2 within normal limits, no S3, no S4, no clicks, no rubs, no murmurs ABD:  Flat, positive bowel sounds normal in frequency in pitch, no bruits, no rebound, no guarding, no midline pulsatile mass, no hepatomegaly, no splenomegaly EXT: TMA.  L UE fistula SKIN:  No rashes no nodules.  Chronic stasis changes NEURO:  Cranial nerves II through XII grossly intact, motor grossly intact throughout [facility]:  Cognitively intact, oriented to person place and time   Labs    Chemistry Recent Labs Lab 08/17/16 1140 08/18/16 0649  NA 140 142  K 2.9* 3.1*  CL 95* 98*  CO2 34* 33*  GLUCOSE 111* 73  BUN 19 22*  CREATININE 3.65* 4.08*  CALCIUM 8.3* 8.4*  GFRNONAA 12* 10*  GFRAA 14* 12*  ANIONGAP 11 11     Hematology Recent Labs Lab 08/17/16 1140  WBC 4.0  RBC 3.75*  HGB 10.6*  HCT 33.1*  MCV 88.3  MCH 28.3  MCHC 32.0  RDW 16.3*  PLT 153    Cardiac Enzymes Recent Labs Lab 08/17/16 1140  TROPONINI 0.03*   No results for input(s): TROPIPOC in the last 168 hours.   BNPNo results for input(s): BNP, PROBNP in the last 168 hours.   DDimer No results for input(s): DDIMER in the last 168 hours.   Radiology    Dg Chest Port 1 View  Result Date: 08/17/2016 CLINICAL DATA:  Acute respiratory distress during endoscopy EXAM: PORTABLE CHEST 1 VIEW COMPARISON:  07/23/2016 FINDINGS: Chronic cardiomegaly. Stable aortic contours. There is no edema, consolidation, effusion, or pneumothorax. No acute osseous finding. IMPRESSION: 1. Stable from prior.  No acute finding. 2. Chronic cardiomegaly. Electronically Signed   By: Monte Fantasia M.D.   On: 08/17/2016 11:49    Cardiac Studies   Echo 08/03/15: Study Conclusions  - Left ventricle: The cavity size was normal. Wall thickness was   increased in a pattern of moderate LVH. Systolic  function was   severely reduced. The estimated ejection fraction was in the   range of 25% to 30%. Diffuse hypokinesis. - Ventricular septum: Septal motion showed abnormal function and   dyssynergy. - Aortic valve: There was mild regurgitation. - Mitral valve: Severely calcified annulus. Mildly thickened   leaflets . There was moderate regurgitation directed posteriorly. - Left atrium: The atrium was moderately dilated.   Anterior-posterior dimension: 51 mm. - Tricuspid valve: There was mild regurgitation.  Impressions:  - Compared to the prior study, there has been no significant   interval change.  LHC 10/27/14: Prox RCA to Mid RCA lesion, 95% stenosed -- new. Post Atrio lesion, 80% stenosed. Ost RPDA lesion, 100% stenosed - previously described. 1. Mid Cx lesion, 75% stenosed. Ost 2nd Mrg lesion, 80% stenosed. 2nd Mrg lesion, 60% stenosed. 2. Mid Cx lesion, 75% stenosed. 3. Prox LAD to Mid LAD lesion, 30% stenosed. Dist LAD lesion, 90% stenosed. 4. 1st Diag lesion, 95% stenosed. 5. Ost 2nd Diag to 2nd Diag lesion, 50% stenosed. 2nd Diag lesion, 60% stenosed. Lat 2nd Diag lesion, 80% stenosed. 6. There is moderate left ventricular systolic dysfunction.  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  Assessment & Plan    # PEA Arrest: # Exertional dyspnea:  # CAD s/p PCI: [name] had a PEA arrest requiring a minute of CPR.  It is not clear that this was an ischemic event.  However, she does report exertional dyspnea for the last several weeks, which was her anginal equivalent.  Will plan for LHC today, given that a Lexiscan Myoview is undoubtedly going to show some level of ischemia.  Given her esophagitis on EGD, would only treat  high risk lesions.  Continue aspirin, metoprolol, and simvastatin. Check fasting lipids.   # Chronic systolic and diastolic heart failure:  [name] is euvolemic. Volume management with HD.  # Atrial tachycardia: Continue amiodarone and metoprolol.Raeanne Barry, MD  08/18/2016, 8:40 AM

## 2016-08-18 NOTE — Progress Notes (Signed)
[facility] Progress Note  Subjective: up in chair, no new issues overnight. No SOB or CP.   Vitals:   08/18/16 0600 08/18/16 0800 08/18/16 0900 08/18/16 1100  BP: (!) 117/47 (!) 117/53 (!) 109/49 (!) 101/52  Pulse:  85 82   Resp: 20 (!) 0 15 18  Temp:  97.6 F (36.4 C)    TempSrc:  Oral    SpO2:  100% 100%   Weight:      Height:        Inpatient medications: . amiodarone  200 mg Oral Daily  . aspirin  81 mg Oral Daily  . Chlorhexidine Gluconate Cloth  6 each Topical Q0600  . dextrose      . heparin  5,000 Units Subcutaneous Q8H  . insulin aspart  0-9 Units Subcutaneous Q4H  . metoprolol tartrate  50 mg Oral Q T,Th,S,Su  . mupirocin ointment  1 application Nasal BID  . pantoprazole  40 mg Oral Daily  . simvastatin  10 mg Oral q1800  . sucralfate  1 g Oral TID WC & HS   . sodium chloride    . sodium chloride    . sodium chloride 10 mL/hr at 08/18/16 0600   sodium chloride, sodium chloride, acetaminophen, ondansetron (ZOFRAN) IV  Exam: Gen no distress No rash, cyanosis or gangrene Sclera anicteric, throat clear  No jvd or bruits Chest clear bilat RRR no MRG Abd soft ntnd no mass or ascites +bs obese GU defer MS no joint effusions or deformity Ext no LE edema / no wounds or ulcers Neuro is alert, Ox 3 , nf  Home meds: - amiodarone 200 qd / metoprolol 50 bid (hold am dose on MWF pre HD) / SL NTG / zocor / asa - phoslo 2-3 ac tid - repaglinide 0.5mg  tid ac  - advair / protonix / eye drops  CXR - no active disease  Dialysis: [facility]  4h  75kg  2/2.25 bath  L arm AVF  Hep 3000  - no ESA - calcitriol 1 ug tiw    Impression: 1  SP cardiac arrest - at end of EGD done today for dysphagia. Brief arrest , responded to CPR x 30 sec and regained pulses.  Known sig CAD, last cath 2016.  Cardiology following, plan for LHC this afternoon.  2  ESRD on HD MWF. Pt stable, will hold HD until tomorrow.  LHC pending today.  3  Atrial tachycardia/ hx  ICM/ LBBB - sp ablation 2017, on amio/ BB. Hypotension w/ HD limits further medical Rx 4  Volume - CXR clear, lungs clear, no edema on exam. Up 7kg by wts, doubt accuracy.  5  Dysphagia - sp EGD today, +esophagitis and gastritis.  6  CAD hx PCI  7  DM2 - per primary   Plan - have postponed HD until Saturday.    Kelly Splinter MD [facility] pager [phone]   08/18/2016, 1:32 PM    Recent Labs Lab 08/17/16 1140 08/18/16 0649  NA 140 142  K 2.9* 3.1*  CL 95* 98*  CO2 34* 33*  GLUCOSE 111* 73  BUN 19 22*  CREATININE 3.65* 4.08*  CALCIUM 8.3* 8.4*   No results for input(s): AST, ALT, ALKPHOS, BILITOT, PROT, ALBUMIN in the last 168 hours.  Recent Labs Lab 08/17/16 1140  WBC 4.0  HGB 10.6*  HCT 33.1*  MCV 88.3  PLT 153   Iron/TIBC/Ferritin/ %Sat    Component Value Date/Time   IRON 16 (  L) 05/21/2011 0715   TIBC 122 (L) 05/21/2011 0715   FERRITIN 2,174 (H) 05/21/2011 0715   IRONPCTSAT 13 (L) 05/21/2011 0715

## 2016-08-18 NOTE — Progress Notes (Signed)
     Thanks for all the great care.  Adding carafate and GERD precautions.  Gatha Mayer, MD, [facility] [phone] (pager) 08/18/2016 9:12 AM

## 2016-08-19 DIAGNOSIS — I5022 Chronic systolic (congestive) heart failure: Secondary | ICD-10-CM

## 2016-08-19 DIAGNOSIS — I9581 Postprocedural hypotension: Secondary | ICD-10-CM

## 2016-08-19 DIAGNOSIS — I469 Cardiac arrest, cause unspecified: Secondary | ICD-10-CM

## 2016-08-19 DIAGNOSIS — E1121 Type 2 diabetes mellitus with diabetic nephropathy: Secondary | ICD-10-CM

## 2016-08-19 DIAGNOSIS — Z992 Dependence on renal dialysis: Secondary | ICD-10-CM

## 2016-08-19 DIAGNOSIS — I471 Supraventricular tachycardia: Secondary | ICD-10-CM

## 2016-08-19 DIAGNOSIS — N186 End stage renal disease: Secondary | ICD-10-CM

## 2016-08-19 LAB — BASIC METABOLIC PANEL
Anion gap: 16 — ABNORMAL HIGH (ref 5–15)
BUN: 33 mg/dL — AB (ref 6–20)
CALCIUM: 9.1 mg/dL (ref 8.9–10.3)
CHLORIDE: 96 mmol/L — AB (ref 101–111)
CO2: 24 mmol/L (ref 22–32)
CREATININE: 5.23 mg/dL — AB (ref 0.44–1.00)
GFR calc Af Amer: 9 mL/min — ABNORMAL LOW (ref >60)
GFR calc non Af Amer: 8 mL/min — ABNORMAL LOW (ref >60)
Glucose, Bld: 186 mg/dL — ABNORMAL HIGH (ref 65–99)
Potassium: 5.3 mmol/L — ABNORMAL HIGH (ref 3.5–5.1)
SODIUM: 136 mmol/L (ref 135–145)

## 2016-08-19 LAB — GLUCOSE, CAPILLARY
GLUCOSE-CAPILLARY: 159 mg/dL — AB (ref 65–99)
Glucose-Capillary: 162 mg/dL — ABNORMAL HIGH (ref 65–99)
Glucose-Capillary: 164 mg/dL — ABNORMAL HIGH (ref 65–99)
Glucose-Capillary: 146 mg/dL — ABNORMAL HIGH (ref 65–99)

## 2016-08-19 LAB — CBC
HCT: 39.2 % (ref 36.0–46.0)
Hemoglobin: 12.4 g/dL (ref 12.0–15.0)
MCH: 27.9 pg (ref 26.0–34.0)
MCHC: 31.6 g/dL (ref 30.0–36.0)
MCV: 88.3 fL (ref 78.0–100.0)
PLATELETS: 178 10*3/uL (ref 150–400)
RBC: 4.44 MIL/uL (ref 3.87–5.11)
RDW: 16.5 % — ABNORMAL HIGH (ref 11.5–15.5)
WBC: 4.7 10*3/uL (ref 4.0–10.5)

## 2016-08-19 LAB — MAGNESIUM: MAGNESIUM: 2.1 mg/dL (ref 1.7–2.4)

## 2016-08-19 MED ORDER — LIDOCAINE-PRILOCAINE 2.5-2.5 % EX CREA: 1.0000 "application " | TOPICAL_CREAM | CUTANEOUS | Status: DC | PRN

## 2016-08-19 MED ORDER — PENTAFLUOROPROP-TETRAFLUOROETH EX AERO: 1.0000 "application " | INHALATION_SPRAY | CUTANEOUS | Status: DC | PRN

## 2016-08-19 MED ORDER — HEPARIN SODIUM (PORCINE) 1000 UNIT/ML DIALYSIS: 1000.0000 [IU] | INTRAMUSCULAR | Status: DC | PRN

## 2016-08-19 MED ORDER — ALTEPLASE 2 MG IJ SOLR: 2.0000 mg | Freq: Once | INTRAMUSCULAR | Status: DC | PRN

## 2016-08-19 MED ORDER — SODIUM CHLORIDE 0.9 % IV SOLN: 100.0000 mL | INTRAVENOUS | Status: DC | PRN

## 2016-08-19 MED ORDER — LIDOCAINE HCL (PF) 1 % IJ SOLN: 5.0000 mL | INTRAMUSCULAR | Status: DC | PRN

## 2016-08-19 MED ORDER — HEPARIN SODIUM (PORCINE) 1000 UNIT/ML DIALYSIS: 3000.0000 [IU] | Freq: Once | INTRAMUSCULAR | Status: DC

## 2016-08-19 NOTE — Progress Notes (Signed)
Progress Note  Patient Name: [name] Date of Encounter: 08/19/2016  Primary Cardiologist: Dr. Ellyn Hack  Subjective   Feeling well without complaint.  Inpatient Medications    Scheduled Meds: . amiodarone  200 mg Oral Daily  . aspirin  81 mg Oral Daily  . Chlorhexidine Gluconate Cloth  6 each Topical Q0600  . heparin  5,000 Units Subcutaneous Q8H  . heparin  5,000 Units Subcutaneous Q8H  . insulin aspart  0-9 Units Subcutaneous Q4H  . metoprolol tartrate  50 mg Oral Q T,Th,S,Su  . mupirocin ointment  1 application Nasal BID  . pantoprazole  40 mg Oral Daily  . simvastatin  10 mg Oral q1800  . sodium chloride flush  3 mL Intravenous Q12H  . sucralfate  1 g Oral TID WC & HS   Continuous Infusions: . sodium chloride    . sodium chloride 250 mL (08/19/16 0400)   PRN Meds: sodium chloride, sodium chloride, acetaminophen, ondansetron (ZOFRAN) IV, sodium chloride flush   Vital Signs    Vitals:   08/19/16 0200 08/19/16 0428 08/19/16 0731 08/19/16 0800  BP: 114/62 116/63 108/65 123/60  Pulse: 83 85    Resp: 11 20 (!) 22 17  Temp:    (!) 97.5 F (36.4 C)  TempSrc:    Oral  SpO2: 94% 95%    Weight:  176 lb 6.4 oz (80 kg)    Height:        Intake/Output Summary (Last 24 hours) at 08/19/16 1208 Last data filed at 08/19/16 0900  Gross per 24 hour  Intake              330 ml  Output               50 ml  Net              280 ml   Filed Weights   08/17/16 1347 08/18/16 0436 08/19/16 0428  Weight: 173 lb 4.5 oz (78.6 kg) 178 lb 2.1 oz (80.8 kg) 176 lb 6.4 oz (80 kg)    Telemetry    SR - Personally Reviewed  ECG    n/a - Personally Reviewed  Physical Exam   VS:  BP 123/60 (BP Location: Right Arm)   Pulse 85   Temp (!) 97.5 F (36.4 C) (Oral)   Resp 17   Ht 5' (1.524 m)   Wt 176 lb 6.4 oz (80 kg)   SpO2 95%   BMI 34.45 kg/m  , BMI Body mass index is 34.45 kg/m. GEN: Well nourished, well developed, in no acute distress  HEENT: normal  Neck: no  JVD, carotid bruits, or masses Cardiac: RRR; no murmurs, rubs, or gallops,no edema  Respiratory:  clear to auscultation bilaterally, normal work of breathing GI: soft, nontender, nondistended, + BS MS: no deformity or atrophy  Skin: warm and dry Neuro:  Strength and sensation are intact Psych: euthymic mood, full affect    Labs    Chemistry  Recent Labs Lab 08/17/16 1140 08/18/16 0649 08/18/16 2138 08/19/16 0940  NA 140 142  --  136  K 2.9* 3.1*  --  5.3*  CL 95* 98*  --  96*  CO2 34* 33*  --  24  GLUCOSE 111* 73  --  186*  BUN 19 22*  --  33*  CREATININE 3.65* 4.08* 4.85* 5.23*  CALCIUM 8.3* 8.4*  --  9.1  GFRNONAA 12* 10* 8* 8*  GFRAA 14* 12* 10* 9*  ANIONGAP 11 11  --  16*     Hematology  Recent Labs Lab 08/17/16 1140 08/18/16 2138 08/19/16 0940  WBC 4.0 6.1 4.7  RBC 3.75* 4.22 4.44  HGB 10.6* 11.9* 12.4  HCT 33.1* 37.2 39.2  MCV 88.3 88.2 88.3  MCH 28.3 28.2 27.9  MCHC 32.0 32.0 31.6  RDW 16.3* 16.2* 16.5*  PLT 153 156 178    Cardiac Enzymes  Recent Labs Lab 08/17/16 1140  TROPONINI 0.03*   No results for input(s): TROPIPOC in the last 168 hours.   BNPNo results for input(s): BNP, PROBNP in the last 168 hours.   DDimer No results for input(s): DDIMER in the last 168 hours.   Radiology    No results found.  Cardiac Studies   Echo 08/03/15: Study Conclusions  - Left ventricle: The cavity size was normal. Wall thickness was   increased in a pattern of moderate LVH. Systolic function was   severely reduced. The estimated ejection fraction was in the   range of 25% to 30%. Diffuse hypokinesis. - Ventricular septum: Septal motion showed abnormal function and   dyssynergy. - Aortic valve: There was mild regurgitation. - Mitral valve: Severely calcified annulus. Mildly thickened   leaflets . There was moderate regurgitation directed posteriorly. - Left atrium: The atrium was moderately dilated.   Anterior-posterior dimension: 51 mm. -  Tricuspid valve: There was mild regurgitation.  Impressions:  - Compared to the prior study, there has been no significant   interval change.  Cath  Dist LAD lesion, 100 %stenosed.  1st Diag lesion, 0 %stenosed.  Ost 2nd Diag to 2nd Diag lesion, 70 %stenosed.  Ost 1st Mrg to 1st Mrg lesion, 80 %stenosed.  Mid RCA lesion, 99 %stenosed.  Ost RPDA to RPDA lesion, 100 %stenosed.  LV end diastolic pressure is normal.   1. 3 vessel obstructive CAD    - CTO of the distal LAD with left to left collaterals    - 80% ostial OM1    - 99% CTO of the mid RCA 2. 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  Assessment & Plan    # PEA Arrest: # Exertional dyspnea:  # CAD s/p PCI: PEA arrest during procedure. Had hypotension with sedation with cath and thus thought that PEA could be due to sedation. Cath without acute disease. Continue medical management.  # Chronic systolic and diastolic heart failure:  Volume managed by HD  # Atrial tachycardia: Continue metoprolol, amiodarone  Signed, Tyona Nilsen Meredith Leeds, MD  08/19/2016, 12:08 PM

## 2016-08-19 NOTE — Progress Notes (Signed)
PROGRESS NOTE    [name]  LPF:[id number] DOB: 01-30-1945 DOA: 08/17/2016 PCP: Cassandria Anger, MD   Brief Narrative:  71 year old BF PMHx ESRD on HD M/W/F, ischemic Cardiomyopathy; LVEF= 25%,Atrial tachycardia , LBBB, CAD S/P Hx stenting,Colon cancer , Diabetes type 2, HLD  Underwent upper EGD 8/9 for evaluation of hematemesis with nausea and esophageal dysphagia since she is on antiplatelet agents. IV propofol was used by anesthesia. Procedure was uneventful, no bradycardia noted, mild esophagitis was noted and a sessile polyp was removed from the duodenum with minimal blood loss. As the scope was removed , the patient was found to be pulseless. She required cardiac compressions for less than a minute until return of carotid pulse. Neo-Synephrine was pushed by anesthesia. Cardiac rhythm remained normal throughout. She was ventilated briefly with bag and mask until she regained consciousness. It was felt given her comorbidities that she should be admitted for observation for 24 hours. Of note last dialysis was on Wednesday 8/8   Subjective: 8/11 A/O 3 (does not know when), negative SOB, negative CP, negative N/V, negative abdominal pain. States has eaten today with negative postprandial pain. States remembers passing out as she called it. States lives at home with her daughter and granddaughter.      Assessment & Plan:   Active Problems:   Erosive esophagitis   Esophageal dysphagia   Hematemesis with nausea   Gastritis and gastroduodenitis   Cardiac arrest ([place])   PEA arrest -After reviewing EMR PEA arrest most likely secondary to hypotension brought on by sedation for EGD. Per note ROSC within 60 seconds. -Cardiology consulted and felt not cardiac in nature, medical management.  Chronic Systolic CHF/Ischemic cardiomyopathy/LBBB -Strict in and out since admission +641ml -Daily weight -Fluid management per HD -Amiodarone 200 mg daily -Metoprolol 50 mg  T/Th/Sat/Sun  Atrial tachycardia -S/P ablation 11/2015 -Resolved -See ischemic cardiomyopathy  Hypotension on HD limiting further therapy -Patient with significant CHF: Considering starting Midodrin, will ask cardiologist.  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 if stable   Consultants:  Nephrology [facility]    Procedures/Significant Events:  8/9 EGD: Grade C reflux esophagitis.-Gastritis-single duodenal polyp resected      I have personally reviewed and interpreted all radiology studies and my findings are as above.  VENTILATOR SETTINGS:    Cultures   Antimicrobials:    Devices    LINES / TUBES:      Continuous Infusions: . sodium chloride    . sodium chloride 250 mL (08/19/16 0400)     Objective: Vitals:   08/19/16 0000 08/19/16 0200 08/19/16 0428 08/19/16 0731  BP: 106/60 114/62 116/63 108/65  Pulse: 92 83 85   Resp: 15 11 20  (!) 22  Temp:      TempSrc:      SpO2: 100% 94% 95%   Weight:   176 lb 6.4 oz (80 kg)   Height:        Intake/Output Summary (Last 24 hours) at 08/19/16 0750 Last data filed at 08/19/16 0444  Gross per 24 hour  Intake              140 ml  Output  50 ml  Net               90 ml   Filed Weights   08/17/16 1347 08/18/16 0436 08/19/16 0428  Weight: 173 lb 4.5 oz (78.6 kg) 178 lb 2.1 oz (80.8 kg) 176 lb 6.4 oz (80 kg)    Examination:  General: A/O 3 (does not know when), No acute respiratory distress Neck:  Negative scars, masses, torticollis, lymphadenopathy, JVD Lungs: Clear to auscultation bilaterally without wheezes or crackles Cardiovascular: Regular rate and rhythm without murmur gallop or  rub normal S1 and S2 Abdomen: Morbidly obese, negative abdominal pain, nondistended, positive soft, bowel sounds, no rebound, no ascites, no appreciable mass Extremities: No significant cyanosis, clubbing. Positive bilateral lower extremity edema 1+, Skin: Negative rashes, lesions, ulcers Psychiatric:  Negative depression, negative anxiety, negative fatigue, negative mania  Central nervous system:  Cranial nerves II through XII intact, tongue/uvula midline, all extremities muscle strength 5/5, sensation intact throughout, negative dysarthria, negative expressive aphasia, negative receptive aphasia.  .     Data Reviewed: Care during the described time interval was provided by me .  I have reviewed this patient's available data, including medical history, events of note, physical examination, and all test results as part of my evaluation.   CBC:  Recent Labs Lab 08/17/16 1140 08/18/16 2138  WBC 4.0 6.1  HGB 10.6* 11.9*  HCT 33.1* 37.2  MCV 88.3 88.2  PLT 153 644   Basic Metabolic Panel:  Recent Labs Lab 08/17/16 1140 08/18/16 0649 08/18/16 2138  NA 140 142  --   K 2.9* 3.1*  --   CL 95* 98*  --   CO2 34* 33*  --   GLUCOSE 111* 73  --   BUN 19 22*  --   CREATININE 3.65* 4.08* 4.85*  CALCIUM 8.3* 8.4*  --    GFR: Estimated Creatinine Clearance: 10.1 mL/min (A) (by C-G formula based on SCr of 4.85 mg/dL (H)). Liver Function Tests: No results for input(s): AST, ALT, ALKPHOS, BILITOT, PROT, ALBUMIN in the last 168 hours. No results for input(s): LIPASE, AMYLASE in the last 168 hours. No results for input(s): AMMONIA in the last 168 hours. Coagulation Profile:  Recent Labs Lab 08/18/16 0649  INR 1.13   Cardiac Enzymes:  Recent Labs Lab 08/17/16 1140  CKTOTAL 78  CKMB 1.8  TROPONINI 0.03*   BNP (last 3 results) No results for input(s): PROBNP in the last 8760 hours. HbA1C: No results for input(s): HGBA1C in the last 72 hours. CBG:  Recent Labs Lab  08/18/16 1523 08/18/16 1737 08/18/16 2012 08/18/16 2251 08/19/16 0426  GLUCAP 71 91 139* 180* 159*   Lipid Profile:  Recent Labs  08/18/16 0639  CHOL 117  HDL 39*  LDLCALC 65  TRIG 64  CHOLHDL 3.0   Thyroid Function Tests: No results for input(s): TSH, T4TOTAL, FREET4, T3FREE, THYROIDAB in the last 72 hours. Anemia Panel: No results for input(s): VITAMINB12, FOLATE, FERRITIN, TIBC, IRON, RETICCTPCT in the last 72 hours. Urine analysis:    Component Value Date/Time   COLORURINE RED (A) 05/20/2011 2219   APPEARANCEUR TURBID (A) 05/20/2011 2219   LABSPEC 1.015 05/20/2011 2219   PHURINE 8.0 05/20/2011 2219   GLUCOSEU 250 (A) 05/20/2011 2219   HGBUR LARGE (A) 05/20/2011 2219   BILIRUBINUR LARGE (A) 05/20/2011 2219   KETONESUR 15 (A) 05/20/2011 2219   PROTEINUR >300 (A) 05/20/2011 2219   UROBILINOGEN 4.0 (H) 05/20/2011 2219   NITRITE POSITIVE (A) 05/20/2011 2219  LEUKOCYTESUR LARGE (A) 05/20/2011 2219   Sepsis Labs: @LABRCNTIP (procalcitonin:4,lacticidven:4)  ) Recent Results (from the past 240 hour(s))  MRSA PCR Screening     Status: Abnormal   Collection Time: 08/17/16  1:10 PM  Result Value Ref Range Status   MRSA by PCR POSITIVE (A) NEGATIVE Final    Comment:        The GeneXpert MRSA Assay (FDA approved for NASAL specimens only), is one component of a comprehensive MRSA colonization surveillance program. It is not intended to diagnose MRSA infection nor to guide or monitor treatment for MRSA infections. RESULT CALLED TO, READ BACK BY AND VERIFIED WITH: FRANKLIN,S @ [id number] ON [id number] BY POTEAT,S          Radiology Studies: Dg Chest Port 1 View  Result Date: 08/17/2016 CLINICAL DATA:  Acute respiratory distress during endoscopy EXAM: PORTABLE CHEST 1 VIEW COMPARISON:  07/23/2016 FINDINGS: Chronic cardiomegaly. Stable aortic contours. There is no edema, consolidation, effusion, or pneumothorax. No acute osseous finding. IMPRESSION: 1. Stable from prior.   No acute finding. 2. Chronic cardiomegaly. Electronically Signed   By: Monte Fantasia M.D.   On: 08/17/2016 11:49        Scheduled Meds: . amiodarone  200 mg Oral Daily  . aspirin  81 mg Oral Daily  . Chlorhexidine Gluconate Cloth  6 each Topical Q0600  . heparin  5,000 Units Subcutaneous Q8H  . heparin  5,000 Units Subcutaneous Q8H  . insulin aspart  0-9 Units Subcutaneous Q4H  . metoprolol tartrate  50 mg Oral Q T,Th,S,Su  . mupirocin ointment  1 application Nasal BID  . pantoprazole  40 mg Oral Daily  . simvastatin  10 mg Oral q1800  . sodium chloride flush  3 mL Intravenous Q12H  . sucralfate  1 g Oral TID WC & HS   Continuous Infusions: . sodium chloride    . sodium chloride 250 mL (08/19/16 0400)     LOS: 2 days    Time spent: 40 minutes    WOODS, Geraldo Docker, MD [facility] Pager [phone]   If 7PM-7AM, please contact night-coverage [email or web address] Password [email or web address] 08/19/2016, 7:50 AM

## 2016-08-19 NOTE — Progress Notes (Addendum)
   Left heart cath no changes  She went apneic with sedation and I think Dr. Martinique is right that she probably had a similar event related to anesthesia and causing the PEA situation immediately after EGD.  OK for home by me - she feels ok today  Qd PPI, GERD precautions, qid carafate please for gastritis and erosive esophagitis  I will contact her next week with path results and f/u plans  Gatha Mayer, MD, [facility] [phone] (pager) 08/19/2016 8:54 AM

## 2016-08-19 NOTE — Progress Notes (Signed)
Patient's daughter reported that patient's confusion is new and requested that MD be notified and she asked about a CT scan. Patient's confusion has waxed and waned but patient is oriented to person and place currently. Patient has been focused on wanting to go home. Patient's daughter concerned since she hadn't had any confusion at home. MD notified and will continue to monitor.

## 2016-08-19 NOTE — Progress Notes (Signed)
[facility] Progress Note  Subjective: up in chair, no new issues overnight. No SOB or CP.  Heart cath showed no new lesions compared to 2016 study.   Vitals:   08/19/16 0200 08/19/16 0428 08/19/16 0731 08/19/16 0800  BP: 114/62 116/63 108/65 123/60  Pulse: 83 85    Resp: 11 20 (!) 22 17  Temp:    (!) 97.5 F (36.4 C)  TempSrc:    Oral  SpO2: 94% 95%    Weight:  80 kg (176 lb 6.4 oz)    Height:        Inpatient medications: . amiodarone  200 mg Oral Daily  . aspirin  81 mg Oral Daily  . Chlorhexidine Gluconate Cloth  6 each Topical Q0600  . heparin  5,000 Units Subcutaneous Q8H  . heparin  5,000 Units Subcutaneous Q8H  . insulin aspart  0-9 Units Subcutaneous Q4H  . metoprolol tartrate  50 mg Oral Q T,Th,S,Su  . mupirocin ointment  1 application Nasal BID  . pantoprazole  40 mg Oral Daily  . simvastatin  10 mg Oral q1800  . sodium chloride flush  3 mL Intravenous Q12H  . sucralfate  1 g Oral TID WC & HS   . sodium chloride    . sodium chloride 250 mL (08/19/16 0400)   sodium chloride, sodium chloride, acetaminophen, ondansetron (ZOFRAN) IV, sodium chloride flush  Exam: Gen no distress No rash, cyanosis or gangrene Sclera anicteric, throat clear  No jvd or bruits Chest clear bilat RRR no MRG Abd soft ntnd no mass or ascites +bs obese GU defer MS no joint effusions or deformity Ext no LE edema / no wounds or ulcers Neuro is alert, Ox 3 , nf  Home meds: - amiodarone 200 qd / metoprolol 50 bid (hold am dose on MWF pre HD) / SL NTG / zocor / asa - phoslo 2-3 ac tid - repaglinide 0.5mg  tid ac  - advair / protonix / eye drops  CXR - no active disease  Dialysis: [facility]  4h  75kg  2/2.25 bath  L arm AVF  Hep 3000  - no ESA - calcitriol 1 ug tiw    Impression: 1  SP cardiac arrest - at end of EGD Brief arrest , responded to CPR x 30 sec and regained pulses.    2  ESRD on HD MWF. HD today  3  Atrial tachycardia - sp ablation 2017, on  amio/ BB 4  CAD/ hx stent/ hx ICM EF 25%-  hypotension w/ HD limits further medical Rx  5  Volume - LVEDP normal at cath, weights are off, no vol on exam 6  Dysphagia - sp EGD today, +esophagitis and gastritis.  7  CAD hx PCI  8  DM2 - per primary 9  Dispo - per pt is going home today if HD goes ok   Plan - HD today, min UF   Kelly Splinter MD [place] pager [phone]   08/19/2016, 9:23 AM    Recent Labs Lab 08/17/16 1140 08/18/16 0649 08/18/16 2138  NA 140 142  --   K 2.9* 3.1*  --   CL 95* 98*  --   CO2 34* 33*  --   GLUCOSE 111* 73  --   BUN 19 22*  --   CREATININE 3.65* 4.08* 4.85*  CALCIUM 8.3* 8.4*  --    No results for input(s): AST, ALT, ALKPHOS, BILITOT, PROT, ALBUMIN in the last 168 hours.  Recent Labs Lab 08/17/16 1140 08/18/16 2138  WBC 4.0 6.1  HGB 10.6* 11.9*  HCT 33.1* 37.2  MCV 88.3 88.2  PLT 153 156   Iron/TIBC/Ferritin/ %Sat    Component Value Date/Time   IRON 16 (L) 05/21/2011 0715   TIBC 122 (L) 05/21/2011 0715   FERRITIN 2,174 (H) 05/21/2011 0715   IRONPCTSAT 13 (L) 05/21/2011 0715

## 2016-08-20 ENCOUNTER — Encounter (HOSPITAL_COMMUNITY): Payer: Self-pay | Admitting: Internal Medicine

## 2016-08-20 DIAGNOSIS — D132 Benign neoplasm of duodenum: Secondary | ICD-10-CM

## 2016-08-20 DIAGNOSIS — K209 Esophagitis, unspecified without bleeding: Secondary | ICD-10-CM

## 2016-08-20 DIAGNOSIS — I447 Left bundle-branch block, unspecified: Secondary | ICD-10-CM

## 2016-08-20 LAB — GLUCOSE, CAPILLARY
GLUCOSE-CAPILLARY: 109 mg/dL — AB (ref 65–99)
GLUCOSE-CAPILLARY: 128 mg/dL — AB (ref 65–99)
GLUCOSE-CAPILLARY: 178 mg/dL — AB (ref 65–99)

## 2016-08-20 MED ORDER — REPAGLINIDE 0.5 MG PO TABS
0.5000 mg | ORAL_TABLET | Freq: Three times a day (TID) | ORAL | Status: DC
  Filled 2016-08-20 (×2): qty 1

## 2016-08-20 MED ORDER — METOPROLOL TARTRATE 50 MG PO TABS: 50.0000 mg | ORAL_TABLET | ORAL | 0 refills | Status: DC

## 2016-08-20 MED ORDER — SUCRALFATE 1 GM/10ML PO SUSP: 1.0000 g | Freq: Three times a day (TID) | ORAL | 0 refills | Status: DC

## 2016-08-20 MED ORDER — LIDOCAINE-PRILOCAINE 2.5-2.5 % EX CREA: 1.0000 "application " | TOPICAL_CREAM | CUTANEOUS | 0 refills | Status: AC | PRN

## 2016-08-20 NOTE — Discharge Summary (Signed)
Physician Discharge Summary  [name]:[id number] DOB: February 18, 1945 DOA: 08/17/2016  PCP: Cassandria Anger, MD  Admit date: 08/17/2016 Discharge date: 08/20/2016  Time spent: 35 minutes  Recommendations for Outpatient Follow-up:  PEA arrest -After reviewing EMR PEA arrest most likely secondary to hypotension brought on by sedation for EGD. Per note ROSC within 60 seconds. -Cardiology was consulted performed left heart catheterization. Felt cause of arrest secondary to hypotension, sedation and apnea with propofol during EGD.  0-Per cardiology continued medical management of patient's chronic systolic CHF. -Schedule follow-up Dr. Meredith Leeds Cardiology [facility] in 3-4 weeks, PEA arrest, chronic systolic CHF, ischemic cardiomyopathy, atrial tachycardia  Chronic Systolic CHF/Ischemic cardiomyopathy/LBBB -Strict in and out since admission -934ml -Daily weight Filed Weights   08/19/16 1400 08/19/16 1745 08/19/16 2209  Weight: 177 lb 11.1 oz (80.6 kg) 175 lb 7.8 oz (79.6 kg) 175 lb 7.8 oz (79.6 kg)  -Amiodarone -Metoprolol -Amiodarone 200 mg daily -Metoprolol 50 mg T/Th/Sat/Sun  Atrial tachycardia -S/P ablation 02/20/2015 -Resolved -See ischemic cardiomyopathy -See PEA arrest  Hypotension on HD limiting further therapy -Patient with significant CHF: Considering starting Midodrin, will ask cardiologist.  ESRD on HD M/W/F -Per Nephrology   Hematemesis s/p EGD- esophagitis and duodenal polyp -Protonix -Schedule 1-2 week follow-up with Dr. [name] GI to discuss pathology findings of resected polyp.   DM Type 2 controlled with renal complications -3/8 Hemoglobin A1c = 6.6  -Restart home medication    Discharge Diagnoses:  Active Problems:   Erosive esophagitis   Esophageal dysphagia   Hematemesis with nausea   Gastritis and gastroduodenitis   Cardiac arrest (HCC)   PEA (Pulseless electrical activity) (HCC)   Chronic systolic CHF (congestive heart  failure) (HCC)   Postprocedural hypotension   End-stage renal disease on hemodialysis ([place])   Controlled type 2 diabetes mellitus with diabetic nephropathy (HCC)   Esophagitis   Adenomatous duodenal polyp   Discharge Condition: Stable  Diet recommendation: Heart healthy/American diabetic Association  Filed Weights   08/19/16 1400 08/19/16 1745 08/19/16 2209  Weight: 177 lb 11.1 oz (80.6 kg) 175 lb 7.8 oz (79.6 kg) 175 lb 7.8 oz (79.6 kg)    History of present illness:  71 year old BF PMHx ESRD on HD M/W/F, ischemic Cardiomyopathy; LVEF= 25%,Atrial tachycardia , LBBB, CAD S/P Hx stenting,Colon cancer , Diabetes type 2, HLD  Underwent upper EGD 8/85for evaluation of hematemesis with nausea and esophageal dysphagia since she is on antiplatelet agents. IV propofol was used by anesthesia. Procedure was uneventful, no bradycardia noted, mild esophagitis was noted and a sessile polyp was removed from the duodenum with minimal blood loss. As the scope was removed , the patient was found to be pulseless. She required cardiac compressions for less than a minute until return of carotid pulse. Neo-Synephrine was pushed by anesthesia. Cardiac rhythm remained normal throughout. She was ventilated briefly with bag and mask until she regained consciousness. It was felt given her comorbidities that she should be admitted for observation for 24 hours. Of note last dialysis was on Wednesday 8/8 During his hospitalization patient was initially treated for esophagitis/duodenal polyp with EGD. S/P EGD patient had 30-60 second episode of PEA arrest before ROSC felt by cardiology to be secondary to sedation used for EGD. No further workup by cardiology recommended and patient has not had any further incidents of arrhythmias. Patient placed back on cardiac medication per cardiology. Patient received HD yesterday. Family also felt there was some confusion however currently patient A/O 4 sitting comfortably in  bed  ready for discharge.       Procedures: 8/9 EGD: Grade C reflux esophagitis.-Gastritis-single duodenal polyp resected  8/10 Left Heart Cardiac Catheterization;-three-vessel obstructive CAD -CT of the distal LAD with left to left collaterals -80% ostial OM1 -99% CT of the mid RCA     I have personally reviewed and interpreted all radiology studies and my findings are as above.   Consultations: Nephrology Cardiology [facility] Dr. Meredith Leeds [facility] Dr. [name]  Cultures   8/9 polyp pathology pending     Antibiotics Anti-infectives    None       Discharge Exam: Vitals:   08/19/16 1745 08/19/16 1846 08/19/16 2209 08/20/16 0446  BP: (!) 112/48 (!) 100/51 (!) 92/42 (!) 149/87  Pulse: 91 88 88 62  Resp: 17 16 16 14   Temp: (!) 97.5 F (36.4 C) 98.4 F (36.9 C) 97.6 F (36.4 C) 97.8 F (36.6 C)  TempSrc: Oral Oral Oral Oral  SpO2: 95% 96% 97% 98%  Weight: 175 lb 7.8 oz (79.6 kg)  175 lb 7.8 oz (79.6 kg)   Height:        General: A/O 4, negative acute respiratory distress Cardiovascular: Regular rhythm and rate, negative murmurs rubs gallops, normal S1/S2 Respiratory: Clear to auscultation bilateral, negative wheezes, negative crackles    Discharge Instructions   Allergies as of 08/20/2016      Reactions   Ace Inhibitors Cough   Eggs Or Egg-derived Products Nausea And Vomiting   Enalapril Cough   Lisinopril Cough   Omnipaque [iohexol] Hives      Medication List    STOP taking these medications   ondansetron 4 MG tablet Commonly known as:  ZOFRAN     TAKE these medications   acetaminophen 500 MG tablet Commonly known as:  TYLENOL Take 500-1,000 mg by mouth every 6 (six) hours as needed (pain).   amiodarone 200 MG tablet Commonly known as:  PACERONE Take 1 tablet (200 mg total) by mouth daily.   aspirin 81 MG tablet Take 1 tablet (81 mg total) by mouth daily.   calcium acetate 667 MG capsule Commonly known as:  PHOSLO Take  1,334-2,001 mg by mouth See admin instructions. Take 2001mg  three times daily with meals and Take 1334mg   with snacks.   dorzolamide-timolol 22.3-6.8 MG/ML ophthalmic solution Commonly known as:  COSOPT Place 1 drop into the left eye 2 (two) times daily.   Fluticasone-Salmeterol 250-50 MCG/DOSE Aepb Commonly known as:  ADVAIR Inhale 1 puff into the lungs daily as needed (shortness of breath).   glucose blood test strip Commonly known as:  ONE TOUCH ULTRA TEST 1 each by Other route daily. And lancets 1/day   lidocaine-prilocaine cream Commonly known as:  EMLA Apply 1 application topically as needed (topical anesthesia for hemodialysis if Gebauers and Lidocaine injection are ineffective.).   metoprolol tartrate 50 MG tablet Commonly known as:  LOPRESSOR Take 1 tablet (50 mg total) by mouth every Tuesday, Thursday, Saturday, and Sunday. What changed:  when to take this  additional instructions   nitroGLYCERIN 0.4 MG SL tablet Commonly known as:  NITROSTAT Place 1 tablet (0.4 mg total) under the tongue every 5 (five) minutes as needed for chest pain.   pantoprazole 40 MG tablet Commonly known as:  PROTONIX Take 1 tablet (40 mg total) by mouth daily before breakfast.   repaglinide 0.5 MG tablet Commonly known as:  PRANDIN TAKE 1 TABLET BY MOUTH THREE TIMES DAILY BEFORE MEALS   simvastatin 10  MG tablet Commonly known as:  ZOCOR Take 1 tablet (10 mg total) by mouth every evening.   sucralfate 1 GM/10ML suspension Commonly known as:  CARAFATE Take 10 mLs (1 g total) by mouth 4 (four) times daily -  with meals and at bedtime.      Allergies  Allergen Reactions  . Ace Inhibitors Cough  . Eggs Or Egg-Derived Products Nausea And Vomiting  . Enalapril Cough  . Lisinopril Cough  . Omnipaque [Iohexol] Hives   Follow-up Information    Gatha Mayer, MD. Schedule an appointment as soon as possible for a visit in 2 week(s).   Specialty:  Gastroenterology Why:  Schedule 1-2  week follow-up with Dr. [name] GI to discuss pathology findings of resected polyp. Contact information: [address] [place] [address] [phone]        Constance Haw, MD. Schedule an appointment as soon as possible for a visit in 4 week(s).   Specialty:  Cardiology Why:  Schedule follow-up Dr. Meredith Leeds Cardiology [facility] in 3-4 weeks, PEA arrest, chronic systolic CHF, ischemic cardiomyopathy, atrial tachycardia Contact information: [place] [address] [phone]            The results of significant diagnostics from this hospitalization (including imaging, microbiology, ancillary and laboratory) are listed below for reference.    Significant Diagnostic Studies: Dg Chest 2 View  Result Date: 07/23/2016 CLINICAL DATA:  Decreased breath sounds and nausea and vomiting EXAM: CHEST  2 VIEW COMPARISON:  05/16/2016 FINDINGS: Cardiac shadow remains enlarged. Aortic calcifications are again seen. The lungs are well aerated bilaterally. No focal infiltrate or sizable effusion is seen. No acute bony abnormality is noted. IMPRESSION: Stable appearance of the chest.  No acute abnormality is noted. Electronically Signed   By: Inez Catalina M.D.   On: 07/23/2016 16:23   Dg Chest Port 1 View  Result Date: 08/17/2016 CLINICAL DATA:  Acute respiratory distress during endoscopy EXAM: PORTABLE CHEST 1 VIEW COMPARISON:  07/23/2016 FINDINGS: Chronic cardiomegaly. Stable aortic contours. There is no edema, consolidation, effusion, or pneumothorax. No acute osseous finding. IMPRESSION: 1. Stable from prior.  No acute finding. 2. Chronic cardiomegaly. Electronically Signed   By: Monte Fantasia M.D.   On: 08/17/2016 11:49    Microbiology: Recent Results (from the past 240 hour(s))  MRSA PCR Screening     Status: Abnormal   Collection Time: 08/17/16  1:10 PM  Result Value Ref Range Status   MRSA by PCR POSITIVE (A) NEGATIVE Final    Comment:         The GeneXpert MRSA Assay (FDA approved for NASAL specimens only), is one component of a comprehensive MRSA colonization surveillance program. It is not intended to diagnose MRSA infection nor to guide or monitor treatment for MRSA infections. RESULT CALLED TO, READ BACK BY AND VERIFIED WITH: FRANKLIN,S @ [id number] ON [id number] BY POTEAT,S      Labs: Basic Metabolic Panel:  Recent Labs Lab 08/17/16 1140 08/18/16 0649 08/18/16 2138 08/19/16 0940  NA 140 142  --  136  K 2.9* 3.1*  --  5.3*  CL 95* 98*  --  96*  CO2 34* 33*  --  24  GLUCOSE 111* 73  --  186*  BUN 19 22*  --  33*  CREATININE 3.65* 4.08* 4.85* 5.23*  CALCIUM 8.3* 8.4*  --  9.1  MG  --   --   --  2.1   Liver Function  Tests: No results for input(s): AST, ALT, ALKPHOS, BILITOT, PROT, ALBUMIN in the last 168 hours. No results for input(s): LIPASE, AMYLASE in the last 168 hours. No results for input(s): AMMONIA in the last 168 hours. CBC:  Recent Labs Lab 08/17/16 1140 08/18/16 2138 08/19/16 0940  WBC 4.0 6.1 4.7  HGB 10.6* 11.9* 12.4  HCT 33.1* 37.2 39.2  MCV 88.3 88.2 88.3  PLT 153 156 178   Cardiac Enzymes:  Recent Labs Lab 08/17/16 1140  CKTOTAL 78  CKMB 1.8  TROPONINI 0.03*   BNP: BNP (last 3 results)  Recent Labs  07/23/16 1640  BNP >4,500.0*    ProBNP (last 3 results) No results for input(s): PROBNP in the last 8760 hours.  CBG:  Recent Labs Lab 08/19/16 1158 08/19/16 2041 08/20/16 0035 08/20/16 0410 08/20/16 0726  GLUCAP 164* 146* 178* 128* 109*       Signed:  Dia Crawford, MD [facility] [phone] pager

## 2016-08-20 NOTE — Progress Notes (Signed)
[name] to be D/C'd Home per MD order.  Discussed prescriptions and follow up appointments with the patient. Prescriptions given to patient, medication list explained in detail. Pt verbalized understanding.  Allergies as of 08/20/2016      Reactions   Ace Inhibitors Cough   Eggs Or Egg-derived Products Nausea And Vomiting   Enalapril Cough   Lisinopril Cough   Omnipaque [iohexol] Hives      Medication List    STOP taking these medications   ondansetron 4 MG tablet Commonly known as:  ZOFRAN     TAKE these medications   acetaminophen 500 MG tablet Commonly known as:  TYLENOL Take 500-1,000 mg by mouth every 6 (six) hours as needed (pain).   amiodarone 200 MG tablet Commonly known as:  PACERONE Take 1 tablet (200 mg total) by mouth daily.   aspirin 81 MG tablet Take 1 tablet (81 mg total) by mouth daily.   calcium acetate 667 MG capsule Commonly known as:  PHOSLO Take 1,334-2,001 mg by mouth See admin instructions. Take 2001mg  three times daily with meals and Take 1334mg   with snacks.   dorzolamide-timolol 22.3-6.8 MG/ML ophthalmic solution Commonly known as:  COSOPT Place 1 drop into the left eye 2 (two) times daily.   Fluticasone-Salmeterol 250-50 MCG/DOSE Aepb Commonly known as:  ADVAIR Inhale 1 puff into the lungs daily as needed (shortness of breath).   glucose blood test strip Commonly known as:  ONE TOUCH ULTRA TEST 1 each by Other route daily. And lancets 1/day   lidocaine-prilocaine cream Commonly known as:  EMLA Apply 1 application topically as needed (topical anesthesia for hemodialysis if Gebauers and Lidocaine injection are ineffective.).   metoprolol tartrate 50 MG tablet Commonly known as:  LOPRESSOR Take 1 tablet (50 mg total) by mouth every Tuesday, Thursday, Saturday, and Sunday. What changed:  when to take this  additional instructions   nitroGLYCERIN 0.4 MG SL tablet Commonly known as:  NITROSTAT Place 1 tablet (0.4 mg total) under  the tongue every 5 (five) minutes as needed for chest pain.   pantoprazole 40 MG tablet Commonly known as:  PROTONIX Take 1 tablet (40 mg total) by mouth daily before breakfast.   repaglinide 0.5 MG tablet Commonly known as:  PRANDIN TAKE 1 TABLET BY MOUTH THREE TIMES DAILY BEFORE MEALS   simvastatin 10 MG tablet Commonly known as:  ZOCOR Take 1 tablet (10 mg total) by mouth every evening.   sucralfate 1 GM/10ML suspension Commonly known as:  CARAFATE Take 10 mLs (1 g total) by mouth 4 (four) times daily -  with meals and at bedtime.       Vitals:   08/20/16 0446 08/20/16 0840  BP: (!) 149/87 (!) 155/80  Pulse: 62 64  Resp: 14 16  Temp: 97.8 F (36.6 C) 97.7 F (36.5 C)  SpO2: 98% 98%    Skin clean, dry and intact without evidence of skin break down, no evidence of skin tears noted. IV catheter discontinued intact. Site without signs and symptoms of complications. Dressing and pressure applied. Pt denies pain at this time. No complaints noted.  An After Visit Summary was printed and given to the patient. Patient escorted via [place], and D/C home via private auto.  Haywood Lasso BSN, RN [facility] Phone [phone]

## 2016-08-21 ENCOUNTER — Encounter (HOSPITAL_COMMUNITY): Payer: Self-pay | Admitting: Cardiology

## 2016-08-21 DIAGNOSIS — D631 Anemia in chronic kidney disease: Secondary | ICD-10-CM | POA: Diagnosis not present

## 2016-08-21 DIAGNOSIS — N186 End stage renal disease: Secondary | ICD-10-CM | POA: Diagnosis not present

## 2016-08-21 DIAGNOSIS — Z23 Encounter for immunization: Secondary | ICD-10-CM | POA: Diagnosis not present

## 2016-08-21 DIAGNOSIS — D509 Iron deficiency anemia, unspecified: Secondary | ICD-10-CM | POA: Diagnosis not present

## 2016-08-21 DIAGNOSIS — N2581 Secondary hyperparathyroidism of renal origin: Secondary | ICD-10-CM | POA: Diagnosis not present

## 2016-08-21 DIAGNOSIS — E119 Type 2 diabetes mellitus without complications: Secondary | ICD-10-CM | POA: Diagnosis not present

## 2016-08-21 NOTE — Consult Note (Signed)
           [facility] CM Primary Care Navigator  08/21/2016  [name] 1945-10-18 [id number]    Wentto seepatient earlier at the bedsideto identify possible discharge needs but she was already discharged per staff report. Patient was discharged home yesterday.  Primary care provider's officeis listed as doing transition of care.    For questions, please contact:  Dannielle Huh, BSN, RN- [place] Primary Care Navigator  Telephone: [phone] [place]

## 2016-08-22 NOTE — Anesthesia Postprocedure Evaluation (Signed)
Anesthesia Post Note  Patient: [name]  Procedure(s) Performed: Procedure(s) (LRB): ESOPHAGOGASTRODUODENOSCOPY (EGD) WITH PROPOFOL (N/A) POSSIBLE BALLOON [place] (N/A)     Patient location during evaluation: PACU Anesthesia Type: MAC Level of consciousness: awake and alert Pain management: pain level controlled Vital Signs Assessment: post-procedure vital signs reviewed and stable Respiratory status: spontaneous breathing, nonlabored ventilation, respiratory function stable and patient connected to nasal cannula oxygen Cardiovascular status: stable and blood pressure returned to baseline Anesthetic complications: no    Last Vitals:  Vitals:   08/20/16 0446 08/20/16 0840  BP: (!) 149/87 (!) 155/80  Pulse: 62 64  Resp: 14 16  Temp: 36.6 C 36.5 C  SpO2: 98% 98%    Last Pain:  Vitals:   08/20/16 1100  TempSrc:   PainSc: 0-No pain                 Layce Sprung,JAMES TERRILL

## 2016-08-23 DIAGNOSIS — D631 Anemia in chronic kidney disease: Secondary | ICD-10-CM | POA: Diagnosis not present

## 2016-08-23 DIAGNOSIS — D509 Iron deficiency anemia, unspecified: Secondary | ICD-10-CM | POA: Diagnosis not present

## 2016-08-23 DIAGNOSIS — E119 Type 2 diabetes mellitus without complications: Secondary | ICD-10-CM | POA: Diagnosis not present

## 2016-08-23 DIAGNOSIS — N2581 Secondary hyperparathyroidism of renal origin: Secondary | ICD-10-CM | POA: Diagnosis not present

## 2016-08-23 DIAGNOSIS — N186 End stage renal disease: Secondary | ICD-10-CM | POA: Diagnosis not present

## 2016-08-23 DIAGNOSIS — Z23 Encounter for immunization: Secondary | ICD-10-CM | POA: Diagnosis not present

## 2016-08-24 ENCOUNTER — Encounter: Payer: Self-pay | Admitting: Internal Medicine

## 2016-08-24 NOTE — Progress Notes (Signed)
Please let patient noted that the esophageal biopsy showed inflammation that I think is from acid reflux. Stay on the medication prescribed for that which includes PPI and Carafate.  The duodenal polyp was benign and removed.  She should see me again next available in October.  No procedure recall

## 2016-08-25 DIAGNOSIS — N186 End stage renal disease: Secondary | ICD-10-CM | POA: Diagnosis not present

## 2016-08-25 DIAGNOSIS — D509 Iron deficiency anemia, unspecified: Secondary | ICD-10-CM | POA: Diagnosis not present

## 2016-08-25 DIAGNOSIS — Z23 Encounter for immunization: Secondary | ICD-10-CM | POA: Diagnosis not present

## 2016-08-25 DIAGNOSIS — N2581 Secondary hyperparathyroidism of renal origin: Secondary | ICD-10-CM | POA: Diagnosis not present

## 2016-08-25 DIAGNOSIS — D631 Anemia in chronic kidney disease: Secondary | ICD-10-CM | POA: Diagnosis not present

## 2016-08-25 DIAGNOSIS — E119 Type 2 diabetes mellitus without complications: Secondary | ICD-10-CM | POA: Diagnosis not present

## 2016-08-28 DIAGNOSIS — N2581 Secondary hyperparathyroidism of renal origin: Secondary | ICD-10-CM | POA: Diagnosis not present

## 2016-08-28 DIAGNOSIS — N186 End stage renal disease: Secondary | ICD-10-CM | POA: Diagnosis not present

## 2016-08-28 DIAGNOSIS — D631 Anemia in chronic kidney disease: Secondary | ICD-10-CM | POA: Diagnosis not present

## 2016-08-28 DIAGNOSIS — Z23 Encounter for immunization: Secondary | ICD-10-CM | POA: Diagnosis not present

## 2016-08-28 DIAGNOSIS — D509 Iron deficiency anemia, unspecified: Secondary | ICD-10-CM | POA: Diagnosis not present

## 2016-08-28 DIAGNOSIS — E119 Type 2 diabetes mellitus without complications: Secondary | ICD-10-CM | POA: Diagnosis not present

## 2016-08-30 DIAGNOSIS — N2581 Secondary hyperparathyroidism of renal origin: Secondary | ICD-10-CM | POA: Diagnosis not present

## 2016-08-30 DIAGNOSIS — N186 End stage renal disease: Secondary | ICD-10-CM | POA: Diagnosis not present

## 2016-08-30 DIAGNOSIS — Z23 Encounter for immunization: Secondary | ICD-10-CM | POA: Diagnosis not present

## 2016-08-30 DIAGNOSIS — D631 Anemia in chronic kidney disease: Secondary | ICD-10-CM | POA: Diagnosis not present

## 2016-08-30 DIAGNOSIS — E119 Type 2 diabetes mellitus without complications: Secondary | ICD-10-CM | POA: Diagnosis not present

## 2016-08-30 DIAGNOSIS — D509 Iron deficiency anemia, unspecified: Secondary | ICD-10-CM | POA: Diagnosis not present

## 2016-09-01 DIAGNOSIS — Z23 Encounter for immunization: Secondary | ICD-10-CM | POA: Diagnosis not present

## 2016-09-01 DIAGNOSIS — D509 Iron deficiency anemia, unspecified: Secondary | ICD-10-CM | POA: Diagnosis not present

## 2016-09-01 DIAGNOSIS — N186 End stage renal disease: Secondary | ICD-10-CM | POA: Diagnosis not present

## 2016-09-01 DIAGNOSIS — E119 Type 2 diabetes mellitus without complications: Secondary | ICD-10-CM | POA: Diagnosis not present

## 2016-09-01 DIAGNOSIS — N2581 Secondary hyperparathyroidism of renal origin: Secondary | ICD-10-CM | POA: Diagnosis not present

## 2016-09-01 DIAGNOSIS — D631 Anemia in chronic kidney disease: Secondary | ICD-10-CM | POA: Diagnosis not present

## 2016-09-04 DIAGNOSIS — E119 Type 2 diabetes mellitus without complications: Secondary | ICD-10-CM | POA: Diagnosis not present

## 2016-09-04 DIAGNOSIS — Z23 Encounter for immunization: Secondary | ICD-10-CM | POA: Diagnosis not present

## 2016-09-04 DIAGNOSIS — N186 End stage renal disease: Secondary | ICD-10-CM | POA: Diagnosis not present

## 2016-09-04 DIAGNOSIS — D509 Iron deficiency anemia, unspecified: Secondary | ICD-10-CM | POA: Diagnosis not present

## 2016-09-04 DIAGNOSIS — N2581 Secondary hyperparathyroidism of renal origin: Secondary | ICD-10-CM | POA: Diagnosis not present

## 2016-09-04 DIAGNOSIS — D631 Anemia in chronic kidney disease: Secondary | ICD-10-CM | POA: Diagnosis not present

## 2016-09-06 DIAGNOSIS — D509 Iron deficiency anemia, unspecified: Secondary | ICD-10-CM | POA: Diagnosis not present

## 2016-09-06 DIAGNOSIS — N186 End stage renal disease: Secondary | ICD-10-CM | POA: Diagnosis not present

## 2016-09-06 DIAGNOSIS — N2581 Secondary hyperparathyroidism of renal origin: Secondary | ICD-10-CM | POA: Diagnosis not present

## 2016-09-06 DIAGNOSIS — E119 Type 2 diabetes mellitus without complications: Secondary | ICD-10-CM | POA: Diagnosis not present

## 2016-09-06 DIAGNOSIS — D631 Anemia in chronic kidney disease: Secondary | ICD-10-CM | POA: Diagnosis not present

## 2016-09-06 DIAGNOSIS — Z23 Encounter for immunization: Secondary | ICD-10-CM | POA: Diagnosis not present

## 2016-09-07 DIAGNOSIS — L97522 Non-pressure chronic ulcer of other part of left foot with fat layer exposed: Secondary | ICD-10-CM | POA: Diagnosis not present

## 2016-09-07 DIAGNOSIS — M2042 Other hammer toe(s) (acquired), left foot: Secondary | ICD-10-CM | POA: Diagnosis not present

## 2016-09-08 DIAGNOSIS — N186 End stage renal disease: Secondary | ICD-10-CM | POA: Diagnosis not present

## 2016-09-08 DIAGNOSIS — Z23 Encounter for immunization: Secondary | ICD-10-CM | POA: Diagnosis not present

## 2016-09-08 DIAGNOSIS — E1122 Type 2 diabetes mellitus with diabetic chronic kidney disease: Secondary | ICD-10-CM | POA: Diagnosis not present

## 2016-09-08 DIAGNOSIS — D509 Iron deficiency anemia, unspecified: Secondary | ICD-10-CM | POA: Diagnosis not present

## 2016-09-08 DIAGNOSIS — Z992 Dependence on renal dialysis: Secondary | ICD-10-CM | POA: Diagnosis not present

## 2016-09-08 DIAGNOSIS — N2581 Secondary hyperparathyroidism of renal origin: Secondary | ICD-10-CM | POA: Diagnosis not present

## 2016-09-08 DIAGNOSIS — E119 Type 2 diabetes mellitus without complications: Secondary | ICD-10-CM | POA: Diagnosis not present

## 2016-09-08 DIAGNOSIS — D631 Anemia in chronic kidney disease: Secondary | ICD-10-CM | POA: Diagnosis not present

## 2016-09-11 DIAGNOSIS — N186 End stage renal disease: Secondary | ICD-10-CM | POA: Diagnosis not present

## 2016-09-11 DIAGNOSIS — D509 Iron deficiency anemia, unspecified: Secondary | ICD-10-CM | POA: Diagnosis not present

## 2016-09-11 DIAGNOSIS — N2581 Secondary hyperparathyroidism of renal origin: Secondary | ICD-10-CM | POA: Diagnosis not present

## 2016-09-11 DIAGNOSIS — E119 Type 2 diabetes mellitus without complications: Secondary | ICD-10-CM | POA: Diagnosis not present

## 2016-09-11 DIAGNOSIS — D631 Anemia in chronic kidney disease: Secondary | ICD-10-CM | POA: Diagnosis not present

## 2016-09-13 DIAGNOSIS — D509 Iron deficiency anemia, unspecified: Secondary | ICD-10-CM | POA: Diagnosis not present

## 2016-09-13 DIAGNOSIS — D631 Anemia in chronic kidney disease: Secondary | ICD-10-CM | POA: Diagnosis not present

## 2016-09-13 DIAGNOSIS — N2581 Secondary hyperparathyroidism of renal origin: Secondary | ICD-10-CM | POA: Diagnosis not present

## 2016-09-13 DIAGNOSIS — N186 End stage renal disease: Secondary | ICD-10-CM | POA: Diagnosis not present

## 2016-09-13 DIAGNOSIS — E119 Type 2 diabetes mellitus without complications: Secondary | ICD-10-CM | POA: Diagnosis not present

## 2016-09-14 DIAGNOSIS — L97522 Non-pressure chronic ulcer of other part of left foot with fat layer exposed: Secondary | ICD-10-CM | POA: Diagnosis not present

## 2016-09-14 DIAGNOSIS — M2042 Other hammer toe(s) (acquired), left foot: Secondary | ICD-10-CM | POA: Diagnosis not present

## 2016-09-15 DIAGNOSIS — D509 Iron deficiency anemia, unspecified: Secondary | ICD-10-CM | POA: Diagnosis not present

## 2016-09-15 DIAGNOSIS — D631 Anemia in chronic kidney disease: Secondary | ICD-10-CM | POA: Diagnosis not present

## 2016-09-15 DIAGNOSIS — N2581 Secondary hyperparathyroidism of renal origin: Secondary | ICD-10-CM | POA: Diagnosis not present

## 2016-09-15 DIAGNOSIS — N186 End stage renal disease: Secondary | ICD-10-CM | POA: Diagnosis not present

## 2016-09-15 DIAGNOSIS — E119 Type 2 diabetes mellitus without complications: Secondary | ICD-10-CM | POA: Diagnosis not present

## 2016-09-18 DIAGNOSIS — D631 Anemia in chronic kidney disease: Secondary | ICD-10-CM | POA: Diagnosis not present

## 2016-09-18 DIAGNOSIS — E119 Type 2 diabetes mellitus without complications: Secondary | ICD-10-CM | POA: Diagnosis not present

## 2016-09-18 DIAGNOSIS — N186 End stage renal disease: Secondary | ICD-10-CM | POA: Diagnosis not present

## 2016-09-18 DIAGNOSIS — N2581 Secondary hyperparathyroidism of renal origin: Secondary | ICD-10-CM | POA: Diagnosis not present

## 2016-09-18 DIAGNOSIS — D509 Iron deficiency anemia, unspecified: Secondary | ICD-10-CM | POA: Diagnosis not present

## 2016-09-20 DIAGNOSIS — N2581 Secondary hyperparathyroidism of renal origin: Secondary | ICD-10-CM | POA: Diagnosis not present

## 2016-09-20 DIAGNOSIS — N186 End stage renal disease: Secondary | ICD-10-CM | POA: Diagnosis not present

## 2016-09-20 DIAGNOSIS — D631 Anemia in chronic kidney disease: Secondary | ICD-10-CM | POA: Diagnosis not present

## 2016-09-20 DIAGNOSIS — D509 Iron deficiency anemia, unspecified: Secondary | ICD-10-CM | POA: Diagnosis not present

## 2016-09-20 DIAGNOSIS — E119 Type 2 diabetes mellitus without complications: Secondary | ICD-10-CM | POA: Diagnosis not present

## 2016-09-22 DIAGNOSIS — D509 Iron deficiency anemia, unspecified: Secondary | ICD-10-CM | POA: Diagnosis not present

## 2016-09-22 DIAGNOSIS — E119 Type 2 diabetes mellitus without complications: Secondary | ICD-10-CM | POA: Diagnosis not present

## 2016-09-22 DIAGNOSIS — N186 End stage renal disease: Secondary | ICD-10-CM | POA: Diagnosis not present

## 2016-09-22 DIAGNOSIS — D631 Anemia in chronic kidney disease: Secondary | ICD-10-CM | POA: Diagnosis not present

## 2016-09-22 DIAGNOSIS — N2581 Secondary hyperparathyroidism of renal origin: Secondary | ICD-10-CM | POA: Diagnosis not present

## 2016-09-25 DIAGNOSIS — D509 Iron deficiency anemia, unspecified: Secondary | ICD-10-CM | POA: Diagnosis not present

## 2016-09-25 DIAGNOSIS — D631 Anemia in chronic kidney disease: Secondary | ICD-10-CM | POA: Diagnosis not present

## 2016-09-25 DIAGNOSIS — E119 Type 2 diabetes mellitus without complications: Secondary | ICD-10-CM | POA: Diagnosis not present

## 2016-09-25 DIAGNOSIS — N2581 Secondary hyperparathyroidism of renal origin: Secondary | ICD-10-CM | POA: Diagnosis not present

## 2016-09-25 DIAGNOSIS — N186 End stage renal disease: Secondary | ICD-10-CM | POA: Diagnosis not present

## 2016-09-27 DIAGNOSIS — D509 Iron deficiency anemia, unspecified: Secondary | ICD-10-CM | POA: Diagnosis not present

## 2016-09-27 DIAGNOSIS — D631 Anemia in chronic kidney disease: Secondary | ICD-10-CM | POA: Diagnosis not present

## 2016-09-27 DIAGNOSIS — N2581 Secondary hyperparathyroidism of renal origin: Secondary | ICD-10-CM | POA: Diagnosis not present

## 2016-09-27 DIAGNOSIS — N186 End stage renal disease: Secondary | ICD-10-CM | POA: Diagnosis not present

## 2016-09-27 DIAGNOSIS — E119 Type 2 diabetes mellitus without complications: Secondary | ICD-10-CM | POA: Diagnosis not present

## 2016-09-29 DIAGNOSIS — N186 End stage renal disease: Secondary | ICD-10-CM | POA: Diagnosis not present

## 2016-09-29 DIAGNOSIS — D631 Anemia in chronic kidney disease: Secondary | ICD-10-CM | POA: Diagnosis not present

## 2016-09-29 DIAGNOSIS — N2581 Secondary hyperparathyroidism of renal origin: Secondary | ICD-10-CM | POA: Diagnosis not present

## 2016-09-29 DIAGNOSIS — D509 Iron deficiency anemia, unspecified: Secondary | ICD-10-CM | POA: Diagnosis not present

## 2016-09-29 DIAGNOSIS — E119 Type 2 diabetes mellitus without complications: Secondary | ICD-10-CM | POA: Diagnosis not present

## 2016-10-02 DIAGNOSIS — D631 Anemia in chronic kidney disease: Secondary | ICD-10-CM | POA: Diagnosis not present

## 2016-10-02 DIAGNOSIS — D509 Iron deficiency anemia, unspecified: Secondary | ICD-10-CM | POA: Diagnosis not present

## 2016-10-02 DIAGNOSIS — E119 Type 2 diabetes mellitus without complications: Secondary | ICD-10-CM | POA: Diagnosis not present

## 2016-10-02 DIAGNOSIS — N186 End stage renal disease: Secondary | ICD-10-CM | POA: Diagnosis not present

## 2016-10-02 DIAGNOSIS — N2581 Secondary hyperparathyroidism of renal origin: Secondary | ICD-10-CM | POA: Diagnosis not present

## 2016-10-04 DIAGNOSIS — D631 Anemia in chronic kidney disease: Secondary | ICD-10-CM | POA: Diagnosis not present

## 2016-10-04 DIAGNOSIS — N2581 Secondary hyperparathyroidism of renal origin: Secondary | ICD-10-CM | POA: Diagnosis not present

## 2016-10-04 DIAGNOSIS — D509 Iron deficiency anemia, unspecified: Secondary | ICD-10-CM | POA: Diagnosis not present

## 2016-10-04 DIAGNOSIS — E119 Type 2 diabetes mellitus without complications: Secondary | ICD-10-CM | POA: Diagnosis not present

## 2016-10-04 DIAGNOSIS — N186 End stage renal disease: Secondary | ICD-10-CM | POA: Diagnosis not present

## 2016-10-06 DIAGNOSIS — N2581 Secondary hyperparathyroidism of renal origin: Secondary | ICD-10-CM | POA: Diagnosis not present

## 2016-10-06 DIAGNOSIS — N186 End stage renal disease: Secondary | ICD-10-CM | POA: Diagnosis not present

## 2016-10-06 DIAGNOSIS — D509 Iron deficiency anemia, unspecified: Secondary | ICD-10-CM | POA: Diagnosis not present

## 2016-10-06 DIAGNOSIS — D631 Anemia in chronic kidney disease: Secondary | ICD-10-CM | POA: Diagnosis not present

## 2016-10-06 DIAGNOSIS — E119 Type 2 diabetes mellitus without complications: Secondary | ICD-10-CM | POA: Diagnosis not present

## 2016-10-08 DIAGNOSIS — Z992 Dependence on renal dialysis: Secondary | ICD-10-CM | POA: Diagnosis not present

## 2016-10-08 DIAGNOSIS — E1129 Type 2 diabetes mellitus with other diabetic kidney complication: Secondary | ICD-10-CM | POA: Diagnosis not present

## 2016-10-08 DIAGNOSIS — N186 End stage renal disease: Secondary | ICD-10-CM | POA: Diagnosis not present

## 2016-10-09 DIAGNOSIS — E119 Type 2 diabetes mellitus without complications: Secondary | ICD-10-CM | POA: Diagnosis not present

## 2016-10-09 DIAGNOSIS — D509 Iron deficiency anemia, unspecified: Secondary | ICD-10-CM | POA: Diagnosis not present

## 2016-10-09 DIAGNOSIS — N186 End stage renal disease: Secondary | ICD-10-CM | POA: Diagnosis not present

## 2016-10-09 DIAGNOSIS — D631 Anemia in chronic kidney disease: Secondary | ICD-10-CM | POA: Diagnosis not present

## 2016-10-09 DIAGNOSIS — N2581 Secondary hyperparathyroidism of renal origin: Secondary | ICD-10-CM | POA: Diagnosis not present

## 2016-10-09 NOTE — Progress Notes (Deleted)
Cardiology Office Note Date:  10/09/2016  Patient ID:  [name], [name] 1946-01-09, MRN [id number] PCP:  Cassandria Anger, MD  Cardiologist:  Dr. Caryl Comes    Chief Complaint:  Post hospital visit  History of Present Illness: [name] is a 71 y.o. female with history of ESRF on HD, HTN, DM, LBBB, PAT on amiodarone, CAD, admitted 10/2014 with acute hypoxic respiratory failure in the setting of non-STEMI. She required urgent dialysis. Follow-up echocardiogram demonstrated worsening LV function with an EF of 35-40%. Cardiac catheterization demonstrated severe multivessel disease with diffuse calcified LAD and distal LAD disease in conjunction with new findings in the proximal D1 and mid RCA. She underwent PCI with a DES to the D1. Unsuccessful attempt was made at PCI of the proximal RCA. If the patient continued to have refractory angina, rotational atherectomy and stenting of the RCA could be considered.  She was seen by Richardson Dopp, PA 08/10/15 with c/o SOB, noted to be in ST vs ATach.  She was seen by myself following this at that time feeling improved, her DOE was less, though still getting a little winded. Ultimately she was referred to Dr. Curt Bears given further reduction in EF thought possibly 2/2 her AT and underwent ablation 11/17/15.  She was admitted to [facility] 08/17/16, she was there initially for elective OP EGD 2./2 c/o dysphagia and upon removal of probe became pulseless required CPR, no arrhythmias noted.  Etiology was suspected to be hypotension, she had LHC without significant change in her anatomy and no PCI was undertaken.  Was noted during her cath that with minimal sedation became apneic resolved with stimulation and meds were reversed further making hypotension/apnea the cause of her PEA arrest.  *** meds *** BP *** careful with meds *** symptoms, syncope, palps   Past Medical History:  Diagnosis Date  . Acute respiratory failure with hypoxia ([place]) 10/23/2014  .  Adenomatous duodenal polyp   . Anemia   . Arthritis    "hands" (11/17/2015)  . Atrial tachycardia ([place])    on amiod  . Cardiomyopathy- mixed   . CHF (congestive heart failure) ([place])   . Chronic diastolic heart failure ([place])   . Colon cancer ([place]) 1986  . Colostomy in place [facility])   . Coronary artery disease    Previously decreased EF; echo 113 normal LV function  . Dialysis patient ([place])   . ESRD (end stage renal disease) on dialysis [facility])    Dr Dunham/Dr. Lyda Kalata.  M, W, Fr; [place] (11/17/2015)  . GERD (gastroesophageal reflux disease)   . Glaucoma   . Heart murmur   . Hernia, incisional    abd  . Hyperlipidemia   . Hypertension   . Hypertensive heart disease    sees Dr. Alain Marion  . LBBB (left bundle branch block)   . Mitral valve insufficiency and aortic valve insufficiency   . Obesity   . Stroke [facility])    2011/12  . Type II diabetes mellitus ([place])     Past Surgical History:  Procedure Laterality Date  . ARTERIOVENOUS GRAFT PLACEMENT Left 2010   "forearm"  . BALLOON DILATION N/A 08/17/2016   Procedure: POSSIBLE BALLOON DILATION POSSIBLE MALONEY;  Surgeon: Gatha Mayer, MD;  Location: [facility] ENDOSCOPY;  Service: Endoscopy;  Laterality: N/A;  . CARDIAC CATHETERIZATION    . CARDIAC CATHETERIZATION N/A 10/27/2014   Procedure: Left Heart Cath and Coronary Angiography;  Surgeon: Leonie Man, MD;  Location: [place] CV LAB;  Service:  Cardiovascular;  Laterality: N/A;  . CARDIAC CATHETERIZATION N/A 10/28/2014   Procedure: Coronary Stent Intervention;  Surgeon: Peter M Martinique, MD;  Location: [place] CV LAB;  Service: Cardiovascular;  Laterality: N/A;  . CARDIAC CATHETERIZATION Bilateral 12/2008   R. heart cath showed elevated left and right heart filling pressures w/ pulmonary artery pressure elevated mildly out of proportion to the wedge. The left heart cath showed diffuse distal vessel disease as well as a 75% stenosis in the mid circumflex w/ a 90% stenosis of the  ostial first obtuse marginal. These lesions were in close proximity. there was a 60-70%mild RCA stenosis.   [name] CATARACT EXTRACTION W/ INTRAOCULAR LENS  IMPLANT, BILATERAL Bilateral   . COLON SURGERY    . COLONOSCOPY    . COLOSTOMY  1986  . ELECTROCARDIOGRAM  04-27-06  . ELECTROPHYSIOLOGIC STUDY N/A 11/17/2015   Procedure: A-Tach Ablation;  Surgeon: Will Meredith Leeds, MD;  Location: [place] CV LAB;  Service: Cardiovascular;  Laterality: N/A;  . ESOPHAGOGASTRODUODENOSCOPY  03-18-04  . ESOPHAGOGASTRODUODENOSCOPY N/A 02/02/2013   Procedure: ESOPHAGOGASTRODUODENOSCOPY (EGD);  Surgeon: Irene Shipper, MD;  Location: [facility] ENDOSCOPY;  Service: Endoscopy;  Laterality: N/A;  . ESOPHAGOGASTRODUODENOSCOPY (EGD) WITH PROPOFOL N/A 08/17/2016   Procedure: ESOPHAGOGASTRODUODENOSCOPY (EGD) WITH PROPOFOL;  Surgeon: Gatha Mayer, MD;  Location: [facility];  Service: Endoscopy;  Laterality: N/A;   NO XRAY NEEDED  . EYE SURGERY    . FOOT AMPUTATION THROUGH METATARSAL  10-07-10   Right foot transmetatarsal  . INSERTION OF AHMED VALVE Right 08/20/2013   Procedure: INSERTION OF AHMED VALVE WITH Mitomycin C application;  Surgeon: Marylynn Pearson, MD;  Location: [place];  Service: Ophthalmology;  Laterality: Right;  . INSERTION OF AHMED VALVE Left 07/22/2014   Procedure: INSERTION OF AHMED VALVE WITH [place];  Surgeon: Marylynn Pearson, MD;  Location: [place];  Service: Ophthalmology;  Laterality: Left;  . LEFT HEART CATH AND CORONARY ANGIOGRAPHY N/A 08/18/2016   Procedure: LEFT HEART CATH AND CORONARY ANGIOGRAPHY;  Surgeon: Martinique, Peter M, MD;  Location: [place] CV LAB;  Service: Cardiovascular;  Laterality: N/A;  . MITOMYCIN C APPLICATION Left 04/17/8117   Procedure: MITOMYCIN C APPLICATION;  Surgeon: Marylynn Pearson, MD;  Location: [place];  Service: Ophthalmology;  Laterality: Left;  . PARS PLANA VITRECTOMY  11/29/2011   Procedure: PARS PLANA VITRECTOMY WITH 23 GAUGE;  Surgeon: Adonis Brook, MD;  Location: [place];   Service: Ophthalmology;  Laterality: Right;  Right Eye 23 ga vitrectomy with membrane peel  . PARS PLANA VITRECTOMY Left 02/28/2012   Procedure: PARS PLANA VITRECTOMY WITH 23 GAUGE;  Surgeon: Adonis Brook, MD;  Location: [place];  Service: Ophthalmology;  Laterality: Left;  . REVISION UROSTOMY CUTANEOUS    . SUPRAVENTRICULAR TACHYCARDIA ABLATION  11/17/2015  . VAGINAL HYSTERECTOMY      Current Outpatient Prescriptions  Medication Sig Dispense Refill  . acetaminophen (TYLENOL) 500 MG tablet Take 500-1,000 mg by mouth every 6 (six) hours as needed (pain).     [name] amiodarone (PACERONE) 200 MG tablet Take 1 tablet (200 mg total) by mouth daily. 90 tablet 3  . aspirin 81 MG tablet Take 1 tablet (81 mg total) by mouth daily. 30 tablet 3  . calcium acetate (PHOSLO) 667 MG capsule Take 1,334-2,001 mg by mouth See admin instructions. Take 2020m three times daily with meals and Take 13363m with snacks.    . dorzolamide-timolol (COSOPT) 22.3-6.8 MG/ML ophthalmic solution Place 1 drop into the left eye 2 (two) times daily.  12  . Fluticasone-Salmeterol (ADVAIR) 250-50 MCG/DOSE AEPB Inhale 1 puff into the lungs daily as needed (shortness of breath).     [name] glucose blood (ONE TOUCH ULTRA TEST) test strip 1 each by Other route daily. And lancets 1/day 100 each 3  . lidocaine-prilocaine (EMLA) cream Apply 1 application topically as needed (topical anesthesia for hemodialysis if Gebauers and Lidocaine injection are ineffective.). 30 g 0  . metoprolol tartrate (LOPRESSOR) 50 MG tablet Take 1 tablet (50 mg total) by mouth every Tuesday, Thursday, Saturday, and Sunday. 30 tablet 0  . nitroGLYCERIN (NITROSTAT) 0.4 MG SL tablet Place 1 tablet (0.4 mg total) under the tongue every 5 (five) minutes as needed for chest pain. 30 tablet 3  . pantoprazole (PROTONIX) 40 MG tablet Take 1 tablet (40 mg total) by mouth daily before breakfast. (Patient not taking: Reported on 08/15/2016) 30 tablet 11  . repaglinide (PRANDIN) 0.5 MG  tablet TAKE 1 TABLET BY MOUTH THREE TIMES DAILY BEFORE MEALS 90 tablet 3  . simvastatin (ZOCOR) 10 MG tablet Take 1 tablet (10 mg total) by mouth every evening. 90 tablet 3  . sucralfate (CARAFATE) 1 GM/10ML suspension Take 10 mLs (1 g total) by mouth 4 (four) times daily -  with meals and at bedtime. 420 mL 0   No current facility-administered medications for this visit.    Facility-Administered Medications Ordered in Other Visits  Medication Dose Route Frequency Provider Last Rate Last Dose  . mitoMYcin (MUTAMYCIN) Injection Use in OR only (0.4 mg/ml)  0.5 mL Left Eye Once Marylynn Pearson, MD        Allergies:   Ace inhibitors; Eggs or egg-derived products; Enalapril; Lisinopril; and Omnipaque [iohexol]   Social History:  The patient  reports that she has never smoked. She has never used smokeless tobacco. She reports that she does not drink alcohol or use drugs.   Family History:  The patient's family history includes Cancer in her sister; Coronary artery disease in her mother; Diabetes in her father; Hypertension in her father, mother, and other.  ROS:  Please see the history of present illness.    All other systems are reviewed and otherwise negative.   PHYSICAL EXAM:  VS:  There were no vitals taken for this visit. BMI: There is no height or weight on file to calculate BMI. Well nourished, well developed, in no acute distress, appears older then her age 71: normocephalic, atraumatic  Neck: no JVD, carotid bruits or masses Cardiac:   *** RRR; tachycardic, no significant murmurs, no rubs, or gallops Lungs:  *** CTA b/ly, no wheezing, rhonchi or rales  Abd: soft, nontender MS: no deformity or atrophy Ext: *** trace LE edema  Skin: warm and dry, no rash Neuro:  No gross deficits appreciated Psych: euthymic mood, full affect   EKG:  ***  08/18/16: LHC  Dist LAD lesion, 100 %stenosed.  1st Diag lesion, 0 %stenosed.  Ost 2nd Diag to 2nd Diag lesion, 70 %stenosed.  Ost 1st  Mrg to 1st Mrg lesion, 80 %stenosed.  Mid RCA lesion, 99 %stenosed.  Ost RPDA to RPDA lesion, 100 %stenosed.  LV end diastolic pressure is normal.   1. 3 vessel obstructive CAD    - CTO of the distal LAD with left to left collaterals    - 80% ostial OM1    - 99% CTO of the mid RCA 2. Normal LVEDP 16 mm Hg  Plan: there is no significant change on angiography compared to 2016. Of note during  the procedure with minimal sedation the patient had apnea with associated significant hypotension that resolved with stimulation. She was given Romazicon to reverse Versed and Narcan to reverse Fentanyl.  I suspect Hypotension with sedation and apnea with propofol  is what caused PEA event during endoscopy procedure.    11/17/15: EPS/Ablation, Dr. Curt Bears CONCLUSIONS:  1. Sinus rhythm upon presentation.  2. The patient had atrial tachycardia 3. Successful radiofrequency modification of the atrial tachycardia focus x2. 4. No inducible arrhythmias following ablation.  5. No early apparent complications.   09/02/15: Holter monitor Avg HR101, min 79, max 133 Reviewed by myself with Dr. Caryl Comes She has times of clear SR, 70's, morphology of the tachycardia appears the same, we see that the tachycardia is stopped on 2 occassions (by the available strips to review) by PVC's each time avg HR 101, min 79, max133   Echo 08/03/15 Moderate LVH, EF 25-30%, diffuse HK, mild AI, severe MAC, moderate MR, moderate LAE, mild TR  LHC 10/28/14 LAD: Heavily calcified throughout, proximal 30%, distal 90%, D1 95%, D2 50%, 60%, lateral D2 80% LCx: Mid 75%, OM1 80% ostial, OM 260% RCA: Proximal 95%, RPDA ostial occluded, posterior Atrio ventricular branch 80% PCI: 2.25 x 12 mm Promus Premier DES to the D1; unsuccessful PCI of the proximal RCA 1. Successful stenting of the first diagonal with a DES 2. Unsuccessful stenting of the mid RCA due to inability to cross with a balloon. This despite good wire, guide, and  Guideliner support. Plan: continue DAPT for one year. Optimize antianginal therapy. If she continues to have significant angina I would consider rotational atherectomy and stenting of the RCA.   Echo 10/25/14  Moderate LVH, EF 35-40%, mild AI, MAC, moderate MR, mild LAE, PASP 56 mmHg   Recent Labs: 05/23/2016: TSH 1.010 07/23/2016: ALT 11; B Natriuretic Peptide >4,500.0 08/19/2016: BUN 33; Creatinine, Ser 5.23; Hemoglobin 12.4; Magnesium 2.1; Platelets 178; Potassium 5.3; Sodium 136  08/18/2016: Cholesterol 117; HDL 39; LDL Cholesterol 65; Total CHOL/HDL Ratio 3.0; Triglycerides 64; VLDL 13   CrCl cannot be calculated (Patient's most recent lab result is older than the maximum 21 days allowed.).   Wt Readings from Last 3 Encounters:  08/19/16 175 lb 7.8 oz (79.6 kg)  07/25/16 182 lb (82.6 kg)  07/23/16 179 lb (81.2 kg)     Other studies reviewed: Additional studies/records reviewed today include: summarized above  ASSESSMENT AND PLAN:  1. ATach     Her holter monitor revealed that the tachycardia is stopped by PVCs     BP stable     Appears asymptomatic     ?etiology of her DOE/cardiomyopathy Discussed with the patient and her daughter possibility of EP/ablation and recommend she be evaluated by Dr. Curt Bears to see if she would be a candidate, they are agreeable to see him to discuss further  2. DOE     no rest SOB, no c/o PND or orthopnea, c/w same DOE    *** fluid management with HD  3. CAD      no CP      *** On ASA, brilinta, BB, statin  4. CM, EF 25-30%     On BB (limited with BP)    ***  Is new, ? Etiology tachycardia mediated  5. PEA arrest w/EGD procedure     Noted apneic with cath/sedation     Event felt 2/2 hypotension    Disposition: ***    Current medicines are reviewed at length with the patient today.  The patient did not have any concerns regarding medicines.  Haywood Lasso, PA-C 10/09/2016 7:26 AM     [address] [place] [phone] (office)  [phone] (fax)

## 2016-10-10 ENCOUNTER — Ambulatory Visit: Payer: Medicare Other | Admitting: Physician Assistant

## 2016-10-11 DIAGNOSIS — N2581 Secondary hyperparathyroidism of renal origin: Secondary | ICD-10-CM | POA: Diagnosis not present

## 2016-10-11 DIAGNOSIS — D631 Anemia in chronic kidney disease: Secondary | ICD-10-CM | POA: Diagnosis not present

## 2016-10-11 DIAGNOSIS — D509 Iron deficiency anemia, unspecified: Secondary | ICD-10-CM | POA: Diagnosis not present

## 2016-10-11 DIAGNOSIS — N186 End stage renal disease: Secondary | ICD-10-CM | POA: Diagnosis not present

## 2016-10-11 DIAGNOSIS — E119 Type 2 diabetes mellitus without complications: Secondary | ICD-10-CM | POA: Diagnosis not present

## 2016-10-12 ENCOUNTER — Ambulatory Visit (INDEPENDENT_AMBULATORY_CARE_PROVIDER_SITE_OTHER): Payer: Medicare Other | Admitting: Physician Assistant

## 2016-10-12 VITALS — BP 126/58 | HR 84 | Ht 60.0 in | Wt 165.0 lb

## 2016-10-12 DIAGNOSIS — I2510 Atherosclerotic heart disease of native coronary artery without angina pectoris: Secondary | ICD-10-CM | POA: Diagnosis not present

## 2016-10-12 DIAGNOSIS — I429 Cardiomyopathy, unspecified: Secondary | ICD-10-CM

## 2016-10-12 DIAGNOSIS — I471 Supraventricular tachycardia: Secondary | ICD-10-CM | POA: Diagnosis not present

## 2016-10-12 DIAGNOSIS — I255 Ischemic cardiomyopathy: Secondary | ICD-10-CM

## 2016-10-12 NOTE — Patient Instructions (Signed)
Medication Instruction.  STOP TAKING AMIODARONE    If you need a refill on your cardiac medications before your next appointment, please call your pharmacy.  Labwork:  NONE ORDERED  TODAY    Testing/Procedures: Your physician has requested that you have an echocardiogram. Echocardiography is a painless test that uses sound waves to create images of your heart. It provides your doctor with information about the size and shape of your heart and how well your heart's chambers and valves are working. This procedure takes approximately one hour. There are no restrictions for this procedure.    Follow-Up:   PRIOR TO NEXT APPOINTMENT    Any Other Special Instructions Will Be Listed Below (If Applicable).

## 2016-10-12 NOTE — Progress Notes (Signed)
Cardiology Office Note Date:  10/12/2016  Patient ID:  Cynthia, Walls 06-29-1945, MRN [id number] PCP:  Cassandria Anger, MD  Cardiologist:  Dr. Caryl Comes    Chief Complaint:  Post hospital visit  History of Present Illness: [name] is a 71 y.o. female with history of ESRF on HD, HTN, DM, LBBB, PAT on amiodarone, CAD, admitted 10/2014 with acute hypoxic respiratory failure in the setting of non-STEMI. She required urgent dialysis. Follow-up echocardiogram demonstrated worsening LV function with an EF of 35-40%. Cardiac catheterization demonstrated severe multivessel disease with diffuse calcified LAD and distal LAD disease in conjunction with new findings in the proximal D1 and mid RCA. She underwent PCI with a DES to the D1. Unsuccessful attempt was made at PCI of the proximal RCA. If the patient continued to have refractory angina, rotational atherectomy and stenting of the RCA could be considered.  She was seen by Cynthia Dopp, PA 08/10/15 with c/o SOB, noted to be in ST vs ATach.  She was seen by myself following this at that time feeling improved, her DOE was less, though still getting a little winded. Ultimately she was referred to Dr. Curt Walls given further reduction in EF thought possibly 2/2 her AT and underwent ablation 11/17/15.  She was admitted to [facility] 08/17/16, she was there initially for elective OP EGD 2./2 c/o dysphagia and upon removal of probe became pulseless required CPR, no arrhythmias noted.  Etiology was suspected to be hypotension, she had LHC without significant change in her anatomy and no PCI was undertaken.  Was noted during her cath that with minimal sedation became apneic resolved with stimulation and meds were reversed further making hypotension/apnea the cause of her PEA arrest.  She is accompanied by her daughter doing well, tolerating her dialysis well without BP issues.  No CP, palpitations, no SOB,  She denies dizziness, near syncope or syncope.   Past  Medical History:  Diagnosis Date  . Acute respiratory failure with hypoxia ([place]) 10/23/2014  . Adenomatous duodenal polyp   . Anemia   . Arthritis    "hands" (11/17/2015)  . Atrial tachycardia ([place])    on amiod  . Cardiomyopathy- mixed   . CHF (congestive heart failure) ([place])   . Chronic diastolic heart failure ([place])   . Colon cancer ([place]) 1986  . Colostomy in place [facility])   . Coronary artery disease    Previously decreased EF; echo 113 normal LV function  . Dialysis patient ([place])   . ESRD (end stage renal disease) on dialysis [facility])    Dr Dunham/Dr. Lyda Kalata.  M, W, Fr; [place] (11/17/2015)  . GERD (gastroesophageal reflux disease)   . Glaucoma   . Heart murmur   . Hernia, incisional    abd  . Hyperlipidemia   . Hypertension   . Hypertensive heart disease    sees Dr. Alain Marion  . LBBB (left bundle branch block)   . Mitral valve insufficiency and aortic valve insufficiency   . Obesity   . Stroke [facility])    2011/12  . Type II diabetes mellitus ([place])     Past Surgical History:  Procedure Laterality Date  . ARTERIOVENOUS GRAFT PLACEMENT Left 2010   "forearm"  . BALLOON DILATION N/A 08/17/2016   Procedure: POSSIBLE BALLOON DILATION POSSIBLE MALONEY;  Surgeon: Gatha Mayer, MD;  Location: [facility] ENDOSCOPY;  Service: Endoscopy;  Laterality: N/A;  . CARDIAC CATHETERIZATION    . CARDIAC CATHETERIZATION N/A 10/27/2014   Procedure: Left Heart Cath  and Coronary Angiography;  Surgeon: Leonie Man, MD;  Location: [place] CV LAB;  Service: Cardiovascular;  Laterality: N/A;  . CARDIAC CATHETERIZATION N/A 10/28/2014   Procedure: Coronary Stent Intervention;  Surgeon: Peter M Martinique, MD;  Location: [place] CV LAB;  Service: Cardiovascular;  Laterality: N/A;  . CARDIAC CATHETERIZATION Bilateral 12/2008   R. heart cath showed elevated left and right heart filling pressures w/ pulmonary artery pressure elevated mildly out of proportion to the wedge. The left heart cath showed diffuse  distal vessel disease as well as a 75% stenosis in the mid circumflex w/ a 90% stenosis of the ostial first obtuse marginal. These lesions were in close proximity. there was a 60-70%mild RCA stenosis.   [name] CATARACT EXTRACTION W/ INTRAOCULAR LENS  IMPLANT, BILATERAL Bilateral   . COLON SURGERY    . COLONOSCOPY    . COLOSTOMY  1986  . ELECTROCARDIOGRAM  04-27-06  . ELECTROPHYSIOLOGIC STUDY N/A 11/17/2015   Procedure: A-Tach Ablation;  Surgeon: Will Meredith Leeds, MD;  Location: [place] CV LAB;  Service: Cardiovascular;  Laterality: N/A;  . ESOPHAGOGASTRODUODENOSCOPY  03-18-04  . ESOPHAGOGASTRODUODENOSCOPY N/A 02/02/2013   Procedure: ESOPHAGOGASTRODUODENOSCOPY (EGD);  Surgeon: Irene Shipper, MD;  Location: [facility] ENDOSCOPY;  Service: Endoscopy;  Laterality: N/A;  . ESOPHAGOGASTRODUODENOSCOPY (EGD) WITH PROPOFOL N/A 08/17/2016   Procedure: ESOPHAGOGASTRODUODENOSCOPY (EGD) WITH PROPOFOL;  Surgeon: Gatha Mayer, MD;  Location: [facility] ENDOS[facility];  Service: Endoscopy;  Laterality: N/A;   NO XRAY NEEDED  . EYE SURGERY    . FOOT AMPUTATION THROUGH METATARSAL  10-07-10   Right foot transmetatarsal  . INSERTION OF AHMED VALVE Right 08/20/2013   Procedure: INSERTION OF AHMED VALVE WITH Mitomycin C application;  Surgeon: Marylynn Pearson, MD;  Location: [place];  Service: Ophthalmology;  Laterality: Right;  . INSERTION OF AHMED VALVE Left 07/22/2014   Procedure: INSERTION OF AHMED VALVE WITH [place];  Surgeon: Marylynn Pearson, MD;  Location: [place];  Service: Ophthalmology;  Laterality: Left;  . LEFT HEART CATH AND CORONARY ANGIOGRAPHY N/A 08/18/2016   Procedure: LEFT HEART CATH AND CORONARY ANGIOGRAPHY;  Surgeon: Martinique, Peter M, MD;  Location: [place] CV LAB;  Service: Cardiovascular;  Laterality: N/A;  . MITOMYCIN C APPLICATION Left 9/93/5701   Procedure: MITOMYCIN C APPLICATION;  Surgeon: Marylynn Pearson, MD;  Location: [place];  Service: Ophthalmology;  Laterality: Left;  . PARS PLANA VITRECTOMY  11/29/2011    Procedure: PARS PLANA VITRECTOMY WITH 23 GAUGE;  Surgeon: Adonis Brook, MD;  Location: [place];  Service: Ophthalmology;  Laterality: Right;  Right Eye 23 ga vitrectomy with membrane peel  . PARS PLANA VITRECTOMY Left 02/28/2012   Procedure: PARS PLANA VITRECTOMY WITH 23 GAUGE;  Surgeon: Adonis Brook, MD;  Location: [place];  Service: Ophthalmology;  Laterality: Left;  . REVISION UROSTOMY CUTANEOUS    . SUPRAVENTRICULAR TACHYCARDIA ABLATION  11/17/2015  . VAGINAL HYSTERECTOMY      Current Outpatient Prescriptions  Medication Sig Dispense Refill  . acetaminophen (TYLENOL) 500 MG tablet Take 500-1,000 mg by mouth every 6 (six) hours as needed (pain).     [name] amiodarone (PACERONE) 200 MG tablet Take 1 tablet (200 mg total) by mouth daily. 90 tablet 3  . aspirin 81 MG tablet Take 1 tablet (81 mg total) by mouth daily. 30 tablet 3  . calcium acetate (PHOSLO) 667 MG capsule Take 1,334-2,001 mg by mouth See admin instructions. Take 2030m three times daily with meals and Take 13320m with snacks.    .[name]  dorzolamide-timolol (COSOPT) 22.3-6.8 MG/ML ophthalmic solution Place 1 drop into the left eye 2 (two) times daily.  12  . Fluticasone-Salmeterol (ADVAIR) 250-50 MCG/DOSE AEPB Inhale 1 puff into the lungs daily as needed (shortness of breath).     [name] glucose blood (ONE TOUCH ULTRA TEST) test strip 1 each by Other route daily. And lancets 1/day 100 each 3  . lidocaine-prilocaine (EMLA) cream Apply 1 application topically as needed (topical anesthesia for hemodialysis if Gebauers and Lidocaine injection are ineffective.). 30 g 0  . metoprolol tartrate (LOPRESSOR) 50 MG tablet Take 1 tablet (50 mg total) by mouth every Tuesday, Thursday, Saturday, and Sunday. 30 tablet 0  . nitroGLYCERIN (NITROSTAT) 0.4 MG SL tablet Place 1 tablet (0.4 mg total) under the tongue every 5 (five) minutes as needed for chest pain. 30 tablet 3  . pantoprazole (PROTONIX) 40 MG tablet Take 1 tablet (40 mg total) by mouth daily before  breakfast. 30 tablet 11  . repaglinide (PRANDIN) 0.5 MG tablet TAKE 1 TABLET BY MOUTH THREE TIMES DAILY BEFORE MEALS 90 tablet 3  . simvastatin (ZOCOR) 10 MG tablet Take 1 tablet (10 mg total) by mouth every evening. 90 tablet 3  . sucralfate (CARAFATE) 1 GM/10ML suspension Take 10 mLs (1 g total) by mouth 4 (four) times daily -  with meals and at bedtime. 420 mL 0   No current facility-administered medications for this visit.    Facility-Administered Medications Ordered in Other Visits  Medication Dose Route Frequency Provider Last Rate Last Dose  . mitoMYcin (MUTAMYCIN) Injection Use in OR only (0.4 mg/ml)  0.5 mL Left Eye Once Marylynn Pearson, MD        Allergies:   Ace inhibitors; Eggs or egg-derived products; Enalapril; Lisinopril; and Omnipaque [iohexol]   Social History:  The patient  reports that she has never smoked. She has never used smokeless tobacco. She reports that she does not drink alcohol or use drugs.   Family History:  The patient's family history includes Cancer in her sister; Coronary artery disease in her mother; Diabetes in her father; Hypertension in her father, mother, and other.  ROS:  Please see the history of present illness.    All other systems are reviewed and otherwise negative.   PHYSICAL EXAM:  VS:  BP (!) 126/58   Pulse 84   Ht 5' (1.524 m)   Wt 165 lb (74.8 kg)   BMI 32.22 kg/m  BMI: Body mass index is 32.22 kg/m. Well nourished, well developed, in no acute distress, appears older then her age 47: normocephalic, atraumatic  Neck: no JVD, carotid bruits or masses Cardiac:   RRR; tachycardic, no significant murmurs, no rubs, or gallops Lungs:  CTA b/ly, no wheezing, rhonchi or rales  Abd: soft, nontender MS: no deformity or atrophy Ext:  trace LE edema  Skin: warm and dry, no rash Neuro:  No gross deficits appreciated Psych: euthymic mood, full affect   EKG:  Not done today  08/18/16: LHC  Dist LAD lesion, 100 %stenosed.  1st Diag  lesion, 0 %stenosed.  Ost 2nd Diag to 2nd Diag lesion, 70 %stenosed.  Ost 1st Mrg to 1st Mrg lesion, 80 %stenosed.  Mid RCA lesion, 99 %stenosed.  Ost RPDA to RPDA lesion, 100 %stenosed.  LV end diastolic pressure is normal.   1. 3 vessel obstructive CAD    - CTO of the distal LAD with left to left collaterals    - 80% ostial OM1    - 99% CTO  of the mid RCA 2. Normal LVEDP 16 mm Hg  Plan: there is no significant change on angiography compared to 2016. Of note during the procedure with minimal sedation the patient had apnea with associated significant hypotension that resolved with stimulation. She was given Romazicon to reverse Versed and Narcan to reverse Fentanyl.  I suspect Hypotension with sedation and apnea with propofol  is what caused PEA event during endoscopy procedure.    11/17/15: EPS/Ablation, Dr. Curt Walls CONCLUSIONS:  1. Sinus rhythm upon presentation.  2. The patient had atrial tachycardia 3. Successful radiofrequency modification of the atrial tachycardia focus x2. 4. No inducible arrhythmias following ablation.  5. No early apparent complications.   09/02/15: Holter monitor Avg HR101, min 79, max 133 Reviewed by myself with Dr. Caryl Comes She has times of clear SR, 70's, morphology of the tachycardia appears the same, we see that the tachycardia is stopped on 2 occassions (by the available strips to review) by PVC's each time avg HR 101, min 79, max133   Echo 08/03/15 Moderate LVH, EF 25-30%, diffuse HK, mild AI, severe MAC, moderate MR, moderate LAE, mild TR  LHC 10/28/14 LAD: Heavily calcified throughout, proximal 30%, distal 90%, D1 95%, D2 50%, 60%, lateral D2 80% LCx: Mid 75%, OM1 80% ostial, OM 260% RCA: Proximal 95%, RPDA ostial occluded, posterior Atrio ventricular branch 80% PCI: 2.25 x 12 mm Promus Premier DES to the D1; unsuccessful PCI of the proximal RCA 1. Successful stenting of the first diagonal with a DES 2. Unsuccessful stenting of the mid RCA  due to inability to cross with a balloon. This despite good wire, guide, and Guideliner support. Plan: continue DAPT for one year. Optimize antianginal therapy. If she continues to have significant angina I would consider rotational atherectomy and stenting of the RCA.   Echo 10/25/14  Moderate LVH, EF 35-40%, mild AI, MAC, moderate MR, mild LAE, PASP 56 mmHg   Recent Labs: 05/23/2016: TSH 1.010 07/23/2016: ALT 11; B Natriuretic Peptide >4,500.0 08/19/2016: BUN 33; Creatinine, Ser 5.23; Hemoglobin 12.4; Magnesium 2.1; Platelets 178; Potassium 5.3; Sodium 136  08/18/2016: Cholesterol 117; HDL 39; LDL Cholesterol 65; Total CHOL/HDL Ratio 3.0; Triglycerides 64; VLDL 13   CrCl cannot be calculated (Patient's most recent lab result is older than the maximum 21 days allowed.).   Wt Readings from Last 3 Encounters:  10/12/16 165 lb (74.8 kg)  08/19/16 175 lb 7.8 oz (79.6 kg)  07/25/16 182 lb (82.6 kg)     Other studies reviewed: Additional studies/records reviewed today include: summarized above  ASSESSMENT AND PLAN:  1. ATach     S/p ablation w/Dr. Curt Walls 11/17/15     In review of records, it appears amio was started 2/2 to her AT, no s/p ablation will stop it  2. DOE (not an ongonig complaint today)    no rest SOB, no c/o PND or orthopnea, c/w same DOE     fluid management with HD  3. CAD      no CP      On ASA, BB, statin      Cath as noted above  4. CM, EF 25-30%     On BB (limited with BP)     Will plan on echo in the next couple months with follow up     5. PEA arrest w/EGD procedure     Noted apneic with cath/sedation     Event felt 2/2 hypotension    Disposition: as above, sooner if needed.    Current  medicines are reviewed at length with the patient today.  The patient did not have any concerns regarding medicines.  Haywood Lasso, PA-C 10/12/2016 2:33 PM     [address] [place] [address] [phone] (office)    [phone] (fax)

## 2016-10-13 DIAGNOSIS — D631 Anemia in chronic kidney disease: Secondary | ICD-10-CM | POA: Diagnosis not present

## 2016-10-13 DIAGNOSIS — D509 Iron deficiency anemia, unspecified: Secondary | ICD-10-CM | POA: Diagnosis not present

## 2016-10-13 DIAGNOSIS — N2581 Secondary hyperparathyroidism of renal origin: Secondary | ICD-10-CM | POA: Diagnosis not present

## 2016-10-13 DIAGNOSIS — E119 Type 2 diabetes mellitus without complications: Secondary | ICD-10-CM | POA: Diagnosis not present

## 2016-10-13 DIAGNOSIS — N186 End stage renal disease: Secondary | ICD-10-CM | POA: Diagnosis not present

## 2016-10-16 DIAGNOSIS — D631 Anemia in chronic kidney disease: Secondary | ICD-10-CM | POA: Diagnosis not present

## 2016-10-16 DIAGNOSIS — N186 End stage renal disease: Secondary | ICD-10-CM | POA: Diagnosis not present

## 2016-10-16 DIAGNOSIS — E119 Type 2 diabetes mellitus without complications: Secondary | ICD-10-CM | POA: Diagnosis not present

## 2016-10-16 DIAGNOSIS — D509 Iron deficiency anemia, unspecified: Secondary | ICD-10-CM | POA: Diagnosis not present

## 2016-10-16 DIAGNOSIS — N2581 Secondary hyperparathyroidism of renal origin: Secondary | ICD-10-CM | POA: Diagnosis not present

## 2016-10-18 DIAGNOSIS — N2581 Secondary hyperparathyroidism of renal origin: Secondary | ICD-10-CM | POA: Diagnosis not present

## 2016-10-18 DIAGNOSIS — N186 End stage renal disease: Secondary | ICD-10-CM | POA: Diagnosis not present

## 2016-10-18 DIAGNOSIS — D631 Anemia in chronic kidney disease: Secondary | ICD-10-CM | POA: Diagnosis not present

## 2016-10-18 DIAGNOSIS — E119 Type 2 diabetes mellitus without complications: Secondary | ICD-10-CM | POA: Diagnosis not present

## 2016-10-18 DIAGNOSIS — D509 Iron deficiency anemia, unspecified: Secondary | ICD-10-CM | POA: Diagnosis not present

## 2016-10-20 DIAGNOSIS — N186 End stage renal disease: Secondary | ICD-10-CM | POA: Diagnosis not present

## 2016-10-20 DIAGNOSIS — N2581 Secondary hyperparathyroidism of renal origin: Secondary | ICD-10-CM | POA: Diagnosis not present

## 2016-10-20 DIAGNOSIS — D631 Anemia in chronic kidney disease: Secondary | ICD-10-CM | POA: Diagnosis not present

## 2016-10-20 DIAGNOSIS — D509 Iron deficiency anemia, unspecified: Secondary | ICD-10-CM | POA: Diagnosis not present

## 2016-10-20 DIAGNOSIS — E119 Type 2 diabetes mellitus without complications: Secondary | ICD-10-CM | POA: Diagnosis not present

## 2016-10-23 DIAGNOSIS — N186 End stage renal disease: Secondary | ICD-10-CM | POA: Diagnosis not present

## 2016-10-23 DIAGNOSIS — N2581 Secondary hyperparathyroidism of renal origin: Secondary | ICD-10-CM | POA: Diagnosis not present

## 2016-10-23 DIAGNOSIS — D631 Anemia in chronic kidney disease: Secondary | ICD-10-CM | POA: Diagnosis not present

## 2016-10-23 DIAGNOSIS — E119 Type 2 diabetes mellitus without complications: Secondary | ICD-10-CM | POA: Diagnosis not present

## 2016-10-23 DIAGNOSIS — D509 Iron deficiency anemia, unspecified: Secondary | ICD-10-CM | POA: Diagnosis not present

## 2016-10-24 ENCOUNTER — Ambulatory Visit: Payer: Medicare Other | Admitting: Internal Medicine

## 2016-10-25 DIAGNOSIS — N2581 Secondary hyperparathyroidism of renal origin: Secondary | ICD-10-CM | POA: Diagnosis not present

## 2016-10-25 DIAGNOSIS — D509 Iron deficiency anemia, unspecified: Secondary | ICD-10-CM | POA: Diagnosis not present

## 2016-10-25 DIAGNOSIS — N186 End stage renal disease: Secondary | ICD-10-CM | POA: Diagnosis not present

## 2016-10-25 DIAGNOSIS — E119 Type 2 diabetes mellitus without complications: Secondary | ICD-10-CM | POA: Diagnosis not present

## 2016-10-25 DIAGNOSIS — D631 Anemia in chronic kidney disease: Secondary | ICD-10-CM | POA: Diagnosis not present

## 2016-10-26 DIAGNOSIS — M71572 Other bursitis, not elsewhere classified, left ankle and foot: Secondary | ICD-10-CM | POA: Diagnosis not present

## 2016-10-27 DIAGNOSIS — E119 Type 2 diabetes mellitus without complications: Secondary | ICD-10-CM | POA: Diagnosis not present

## 2016-10-27 DIAGNOSIS — D631 Anemia in chronic kidney disease: Secondary | ICD-10-CM | POA: Diagnosis not present

## 2016-10-27 DIAGNOSIS — N2581 Secondary hyperparathyroidism of renal origin: Secondary | ICD-10-CM | POA: Diagnosis not present

## 2016-10-27 DIAGNOSIS — D509 Iron deficiency anemia, unspecified: Secondary | ICD-10-CM | POA: Diagnosis not present

## 2016-10-27 DIAGNOSIS — N186 End stage renal disease: Secondary | ICD-10-CM | POA: Diagnosis not present

## 2016-10-30 DIAGNOSIS — D509 Iron deficiency anemia, unspecified: Secondary | ICD-10-CM | POA: Diagnosis not present

## 2016-10-30 DIAGNOSIS — N2581 Secondary hyperparathyroidism of renal origin: Secondary | ICD-10-CM | POA: Diagnosis not present

## 2016-10-30 DIAGNOSIS — D631 Anemia in chronic kidney disease: Secondary | ICD-10-CM | POA: Diagnosis not present

## 2016-10-30 DIAGNOSIS — N186 End stage renal disease: Secondary | ICD-10-CM | POA: Diagnosis not present

## 2016-10-30 DIAGNOSIS — E119 Type 2 diabetes mellitus without complications: Secondary | ICD-10-CM | POA: Diagnosis not present

## 2016-10-31 ENCOUNTER — Other Ambulatory Visit: Payer: Self-pay

## 2016-10-31 ENCOUNTER — Ambulatory Visit (HOSPITAL_COMMUNITY): Payer: Medicare Other | Attending: Physician Assistant

## 2016-10-31 DIAGNOSIS — I471 Supraventricular tachycardia: Secondary | ICD-10-CM | POA: Insufficient documentation

## 2016-10-31 DIAGNOSIS — I429 Cardiomyopathy, unspecified: Secondary | ICD-10-CM

## 2016-10-31 DIAGNOSIS — I447 Left bundle-branch block, unspecified: Secondary | ICD-10-CM | POA: Diagnosis not present

## 2016-10-31 DIAGNOSIS — I252 Old myocardial infarction: Secondary | ICD-10-CM | POA: Diagnosis not present

## 2016-10-31 DIAGNOSIS — I255 Ischemic cardiomyopathy: Secondary | ICD-10-CM | POA: Diagnosis not present

## 2016-10-31 DIAGNOSIS — I083 Combined rheumatic disorders of mitral, aortic and tricuspid valves: Secondary | ICD-10-CM | POA: Insufficient documentation

## 2016-10-31 DIAGNOSIS — E785 Hyperlipidemia, unspecified: Secondary | ICD-10-CM | POA: Insufficient documentation

## 2016-10-31 DIAGNOSIS — E1122 Type 2 diabetes mellitus with diabetic chronic kidney disease: Secondary | ICD-10-CM | POA: Diagnosis not present

## 2016-10-31 DIAGNOSIS — I509 Heart failure, unspecified: Secondary | ICD-10-CM | POA: Diagnosis not present

## 2016-10-31 DIAGNOSIS — I132 Hypertensive heart and chronic kidney disease with heart failure and with stage 5 chronic kidney disease, or end stage renal disease: Secondary | ICD-10-CM | POA: Diagnosis not present

## 2016-10-31 DIAGNOSIS — I469 Cardiac arrest, cause unspecified: Secondary | ICD-10-CM | POA: Diagnosis not present

## 2016-10-31 DIAGNOSIS — N186 End stage renal disease: Secondary | ICD-10-CM | POA: Insufficient documentation

## 2016-11-01 DIAGNOSIS — E1129 Type 2 diabetes mellitus with other diabetic kidney complication: Secondary | ICD-10-CM | POA: Diagnosis not present

## 2016-11-01 DIAGNOSIS — N2581 Secondary hyperparathyroidism of renal origin: Secondary | ICD-10-CM | POA: Diagnosis not present

## 2016-11-01 DIAGNOSIS — D631 Anemia in chronic kidney disease: Secondary | ICD-10-CM | POA: Diagnosis not present

## 2016-11-01 DIAGNOSIS — D509 Iron deficiency anemia, unspecified: Secondary | ICD-10-CM | POA: Diagnosis not present

## 2016-11-01 DIAGNOSIS — N186 End stage renal disease: Secondary | ICD-10-CM | POA: Diagnosis not present

## 2016-11-01 DIAGNOSIS — E119 Type 2 diabetes mellitus without complications: Secondary | ICD-10-CM | POA: Diagnosis not present

## 2016-11-01 LAB — ECHOCARDIOGRAM COMPLETE
AO mean calculated velocity dopler: 126 cm/s
AV Area VTI index: 0.94 cm2/m2
AV Area VTI: 1.69 cm2
AV Area mean vel: 1.45 cm2
AV Mean grad: 7 mmHg
AV Peak grad: 14 mmHg
AV VEL mean LVOT/AV: 0.57
AV area mean vel ind: 0.85 cm2/m2
AV peak Index: 0.98
AV pk vel: 189 cm/s
AV vel: 1.61
Ao pk vel: 0.67 m/s
Ao-asc: 35 cm
Area-P 1/2: 3.73 cm2
FS: 7 % — AB (ref 28–44)
IVS/LV PW RATIO, ED: 0.99
LA ID, A-P, ES: 50 mm
LA diam end sys: 50 mm
LA diam index: 2.91 cm/m2
LA vol A4C: 77.2 mL
LA vol index: 47.2 mL/m2
LA vol: 81.1 mL
LV PW d: 12.2 mm — AB (ref 0.6–1.1)
LVOT MV VTI INDEX: 0.84 cm2/m2
LVOT MV VTI: 1.44
LVOT SV: 56 mL
LVOT VTI: 21.9 cm
LVOT area: 2.54 cm2
LVOT diameter: 18 mm
LVOT peak VTI: 0.63 cm
LVOT peak grad rest: 6 mmHg
LVOT peak vel: 126 cm/s
Lateral S' vel: 5.93 cm/s
MV Annulus VTI: 38.5 cm
MV M vel: 146
Mean grad: 10 mmHg
P 1/2 time: 188 ms
P 1/2 time: 59 ms
PISA EROA: 0.22 cm2
Reg peak vel: 354 cm/s
TAPSE: 13.7 mm
TR max vel: 354 cm/s
VTI: 163 cm
VTI: 34.5 cm
Valve area index: 0.94
Valve area: 1.61 cm2

## 2016-11-03 ENCOUNTER — Emergency Department (HOSPITAL_COMMUNITY): Payer: Medicare Other

## 2016-11-03 ENCOUNTER — Other Ambulatory Visit: Payer: Self-pay

## 2016-11-03 ENCOUNTER — Inpatient Hospital Stay (HOSPITAL_COMMUNITY)
Admission: EM | Admit: 2016-11-03 | Discharge: 2016-11-06 | DRG: 312 | Disposition: A | Discharge status: Home / Self Care | Payer: Medicare Other | Attending: Internal Medicine | Admitting: Internal Medicine

## 2016-11-03 ENCOUNTER — Encounter (HOSPITAL_COMMUNITY): Payer: Self-pay

## 2016-11-03 ENCOUNTER — Observation Stay (HOSPITAL_COMMUNITY): Payer: Medicare Other

## 2016-11-03 DIAGNOSIS — E1122 Type 2 diabetes mellitus with diabetic chronic kidney disease: Secondary | ICD-10-CM | POA: Diagnosis present

## 2016-11-03 DIAGNOSIS — Z933 Colostomy status: Secondary | ICD-10-CM

## 2016-11-03 DIAGNOSIS — Z6831 Body mass index (BMI) 31.0-31.9, adult: Secondary | ICD-10-CM

## 2016-11-03 DIAGNOSIS — Z8674 Personal history of sudden cardiac arrest: Secondary | ICD-10-CM

## 2016-11-03 DIAGNOSIS — I248 Other forms of acute ischemic heart disease: Secondary | ICD-10-CM | POA: Diagnosis present

## 2016-11-03 DIAGNOSIS — Z90710 Acquired absence of both cervix and uterus: Secondary | ICD-10-CM

## 2016-11-03 DIAGNOSIS — Z79899 Other long term (current) drug therapy: Secondary | ICD-10-CM

## 2016-11-03 DIAGNOSIS — I120 Hypertensive chronic kidney disease with stage 5 chronic kidney disease or end stage renal disease: Secondary | ICD-10-CM | POA: Diagnosis not present

## 2016-11-03 DIAGNOSIS — N2581 Secondary hyperparathyroidism of renal origin: Secondary | ICD-10-CM | POA: Diagnosis present

## 2016-11-03 DIAGNOSIS — Z888 Allergy status to other drugs, medicaments and biological substances status: Secondary | ICD-10-CM

## 2016-11-03 DIAGNOSIS — N903 Dysplasia of vulva, unspecified: Secondary | ICD-10-CM | POA: Diagnosis present

## 2016-11-03 DIAGNOSIS — Z955 Presence of coronary angioplasty implant and graft: Secondary | ICD-10-CM

## 2016-11-03 DIAGNOSIS — E11649 Type 2 diabetes mellitus with hypoglycemia without coma: Secondary | ICD-10-CM | POA: Diagnosis present

## 2016-11-03 DIAGNOSIS — N186 End stage renal disease: Secondary | ICD-10-CM

## 2016-11-03 DIAGNOSIS — I509 Heart failure, unspecified: Secondary | ICD-10-CM

## 2016-11-03 DIAGNOSIS — I132 Hypertensive heart and chronic kidney disease with heart failure and with stage 5 chronic kidney disease, or end stage renal disease: Secondary | ICD-10-CM | POA: Diagnosis not present

## 2016-11-03 DIAGNOSIS — Z8673 Personal history of transient ischemic attack (TIA), and cerebral infarction without residual deficits: Secondary | ICD-10-CM

## 2016-11-03 DIAGNOSIS — I5042 Chronic combined systolic (congestive) and diastolic (congestive) heart failure: Secondary | ICD-10-CM | POA: Diagnosis not present

## 2016-11-03 DIAGNOSIS — H409 Unspecified glaucoma: Secondary | ICD-10-CM | POA: Diagnosis present

## 2016-11-03 DIAGNOSIS — D649 Anemia, unspecified: Secondary | ICD-10-CM | POA: Diagnosis present

## 2016-11-03 DIAGNOSIS — E785 Hyperlipidemia, unspecified: Secondary | ICD-10-CM | POA: Diagnosis present

## 2016-11-03 DIAGNOSIS — R55 Syncope and collapse: Principal | ICD-10-CM

## 2016-11-03 DIAGNOSIS — I252 Old myocardial infarction: Secondary | ICD-10-CM

## 2016-11-03 DIAGNOSIS — Z91041 Radiographic dye allergy status: Secondary | ICD-10-CM

## 2016-11-03 DIAGNOSIS — R7989 Other specified abnormal findings of blood chemistry: Secondary | ICD-10-CM | POA: Diagnosis present

## 2016-11-03 DIAGNOSIS — R1110 Vomiting, unspecified: Secondary | ICD-10-CM

## 2016-11-03 DIAGNOSIS — Z992 Dependence on renal dialysis: Secondary | ICD-10-CM

## 2016-11-03 DIAGNOSIS — I429 Cardiomyopathy, unspecified: Secondary | ICD-10-CM | POA: Diagnosis present

## 2016-11-03 DIAGNOSIS — Z9841 Cataract extraction status, right eye: Secondary | ICD-10-CM

## 2016-11-03 DIAGNOSIS — I083 Combined rheumatic disorders of mitral, aortic and tricuspid valves: Secondary | ICD-10-CM | POA: Diagnosis present

## 2016-11-03 DIAGNOSIS — Z91012 Allergy to eggs: Secondary | ICD-10-CM

## 2016-11-03 DIAGNOSIS — R778 Other specified abnormalities of plasma proteins: Secondary | ICD-10-CM | POA: Diagnosis present

## 2016-11-03 DIAGNOSIS — Z7951 Long term (current) use of inhaled steroids: Secondary | ICD-10-CM

## 2016-11-03 DIAGNOSIS — Z8249 Family history of ischemic heart disease and other diseases of the circulatory system: Secondary | ICD-10-CM

## 2016-11-03 DIAGNOSIS — Z961 Presence of intraocular lens: Secondary | ICD-10-CM | POA: Diagnosis present

## 2016-11-03 DIAGNOSIS — Z89439 Acquired absence of unspecified foot: Secondary | ICD-10-CM

## 2016-11-03 DIAGNOSIS — I119 Hypertensive heart disease without heart failure: Secondary | ICD-10-CM | POA: Diagnosis present

## 2016-11-03 DIAGNOSIS — Z936 Other artificial openings of urinary tract status: Secondary | ICD-10-CM

## 2016-11-03 DIAGNOSIS — Z9842 Cataract extraction status, left eye: Secondary | ICD-10-CM

## 2016-11-03 DIAGNOSIS — I2510 Atherosclerotic heart disease of native coronary artery without angina pectoris: Secondary | ICD-10-CM

## 2016-11-03 DIAGNOSIS — Z7982 Long term (current) use of aspirin: Secondary | ICD-10-CM

## 2016-11-03 DIAGNOSIS — Z85038 Personal history of other malignant neoplasm of large intestine: Secondary | ICD-10-CM

## 2016-11-03 DIAGNOSIS — I959 Hypotension, unspecified: Secondary | ICD-10-CM | POA: Diagnosis present

## 2016-11-03 DIAGNOSIS — D72829 Elevated white blood cell count, unspecified: Secondary | ICD-10-CM | POA: Diagnosis present

## 2016-11-03 DIAGNOSIS — R404 Transient alteration of awareness: Secondary | ICD-10-CM | POA: Diagnosis not present

## 2016-11-03 LAB — URINALYSIS, ROUTINE W REFLEX MICROSCOPIC
Bilirubin Urine: NEGATIVE
Glucose, UA: NEGATIVE mg/dL
KETONES UR: NEGATIVE mg/dL
Nitrite: NEGATIVE
PH: 7 (ref 5.0–8.0)
Protein, ur: 100 mg/dL — AB
SPECIFIC GRAVITY, URINE: 1.012 (ref 1.005–1.030)
SQUAMOUS EPITHELIAL / LPF: NONE SEEN

## 2016-11-03 LAB — I-STAT CHEM 8, ED
BUN: 25 mg/dL — ABNORMAL HIGH (ref 6–20)
Calcium, Ion: 0.94 mmol/L — ABNORMAL LOW (ref 1.15–1.40)
Chloride: 102 mmol/L (ref 101–111)
Creatinine, Ser: 4.4 mg/dL — ABNORMAL HIGH (ref 0.44–1.00)
Glucose, Bld: 116 mg/dL — ABNORMAL HIGH (ref 65–99)
HEMATOCRIT: 44 % (ref 36.0–46.0)
HEMOGLOBIN: 15 g/dL (ref 12.0–15.0)
POTASSIUM: 3.9 mmol/L (ref 3.5–5.1)
SODIUM: 140 mmol/L (ref 135–145)
TCO2: 26 mmol/L (ref 22–32)

## 2016-11-03 LAB — COMPREHENSIVE METABOLIC PANEL
ALBUMIN: 3.3 g/dL — AB (ref 3.5–5.0)
ALT: 10 U/L — AB (ref 14–54)
AST: 25 U/L (ref 15–41)
Alkaline Phosphatase: 97 U/L (ref 38–126)
Anion gap: 19 — ABNORMAL HIGH (ref 5–15)
BUN: 22 mg/dL — AB (ref 6–20)
CHLORIDE: 100 mmol/L — AB (ref 101–111)
CO2: 22 mmol/L (ref 22–32)
CREATININE: 4.83 mg/dL — AB (ref 0.44–1.00)
Calcium: 9.2 mg/dL (ref 8.9–10.3)
GFR calc Af Amer: 10 mL/min — ABNORMAL LOW (ref >60)
GFR, EST NON AFRICAN AMERICAN: 8 mL/min — AB (ref >60)
GLUCOSE: 110 mg/dL — AB (ref 65–99)
Potassium: 4 mmol/L (ref 3.5–5.1)
Sodium: 141 mmol/L (ref 135–145)
Total Bilirubin: 1.5 mg/dL — ABNORMAL HIGH (ref 0.3–1.2)
Total Protein: 7.1 g/dL (ref 6.5–8.1)

## 2016-11-03 LAB — I-STAT TROPONIN, ED
TROPONIN I, POC: 0.39 ng/mL — AB (ref 0.00–0.08)
Troponin i, poc: 0.71 ng/mL (ref 0.00–0.08)

## 2016-11-03 LAB — GLUCOSE, CAPILLARY: Glucose-Capillary: 125 mg/dL — ABNORMAL HIGH (ref 65–99)

## 2016-11-03 LAB — CBC WITH DIFFERENTIAL/PLATELET
Basophils Absolute: 0 10*3/uL (ref 0.0–0.1)
Basophils Relative: 0 %
EOS ABS: 0 10*3/uL (ref 0.0–0.7)
Eosinophils Relative: 0 %
HCT: 41 % (ref 36.0–46.0)
Hemoglobin: 13.1 g/dL (ref 12.0–15.0)
LYMPHS ABS: 0.5 10*3/uL — AB (ref 0.7–4.0)
LYMPHS PCT: 3 %
MCH: 28.7 pg (ref 26.0–34.0)
MCHC: 32 g/dL (ref 30.0–36.0)
MCV: 89.7 fL (ref 78.0–100.0)
MONO ABS: 1.2 10*3/uL — AB (ref 0.1–1.0)
MONOS PCT: 8 %
Neutro Abs: 12.9 10*3/uL — ABNORMAL HIGH (ref 1.7–7.7)
Neutrophils Relative %: 88 %
Platelets: 142 10*3/uL — ABNORMAL LOW (ref 150–400)
RBC: 4.57 MIL/uL (ref 3.87–5.11)
RDW: 17.2 % — AB (ref 11.5–15.5)
WBC: 14.6 10*3/uL — ABNORMAL HIGH (ref 4.0–10.5)

## 2016-11-03 MED ORDER — SODIUM CHLORIDE 0.9% FLUSH
3.0000 mL | Freq: Two times a day (BID) | INTRAVENOUS | Status: DC
  Administered 2016-11-03 – 2016-11-06 (×6): 3 mL via INTRAVENOUS

## 2016-11-03 MED ORDER — SIMVASTATIN 10 MG PO TABS: 10.0000 mg | ORAL_TABLET | Freq: Every evening | ORAL | Status: DC

## 2016-11-03 MED ORDER — CALCIUM ACETATE 667 MG PO CAPS: 1334.0000 mg | ORAL_CAPSULE | ORAL | Status: DC

## 2016-11-03 MED ORDER — ONDANSETRON HCL 4 MG/2ML IJ SOLN
4.0000 mg | Freq: Four times a day (QID) | INTRAMUSCULAR | Status: DC | PRN
  Administered 2016-11-05: 4 mg via INTRAVENOUS
  Filled 2016-11-03 (×2): qty 2

## 2016-11-03 MED ORDER — SODIUM CHLORIDE 0.9% FLUSH: 3.0000 mL | INTRAVENOUS | Status: DC | PRN

## 2016-11-03 MED ORDER — HEPARIN SODIUM (PORCINE) 5000 UNIT/ML IJ SOLN
5000.0000 [IU] | Freq: Two times a day (BID) | INTRAMUSCULAR | Status: DC
  Administered 2016-11-03: 5000 [IU] via SUBCUTANEOUS
  Filled 2016-11-03 (×2): qty 1

## 2016-11-03 MED ORDER — LIDOCAINE-PRILOCAINE 2.5-2.5 % EX CREA
1.0000 "application " | TOPICAL_CREAM | CUTANEOUS | Status: DC | PRN
  Filled 2016-11-03: qty 5

## 2016-11-03 MED ORDER — ALBUTEROL SULFATE (2.5 MG/3ML) 0.083% IN NEBU: 2.5000 mg | INHALATION_SOLUTION | RESPIRATORY_TRACT | Status: DC | PRN

## 2016-11-03 MED ORDER — SENNA 8.6 MG PO TABS
1.0000 | ORAL_TABLET | Freq: Two times a day (BID) | ORAL | Status: DC
  Administered 2016-11-06: 8.6 mg via ORAL
  Filled 2016-11-03 (×7): qty 1

## 2016-11-03 MED ORDER — ACETAMINOPHEN 325 MG PO TABS: 650.0000 mg | ORAL_TABLET | Freq: Four times a day (QID) | ORAL | Status: DC | PRN

## 2016-11-03 MED ORDER — TRAZODONE HCL 50 MG PO TABS: 50.0000 mg | ORAL_TABLET | Freq: Every evening | ORAL | Status: DC | PRN

## 2016-11-03 MED ORDER — CALCIUM ACETATE (PHOS BINDER) 667 MG PO CAPS
2001.0000 mg | ORAL_CAPSULE | Freq: Three times a day (TID) | ORAL | Status: DC
  Administered 2016-11-04 – 2016-11-05 (×5): 2001 mg via ORAL
  Filled 2016-11-03 (×5): qty 3

## 2016-11-03 MED ORDER — ASPIRIN 81 MG PO CHEW
324.0000 mg | CHEWABLE_TABLET | Freq: Once | ORAL | Status: AC
  Administered 2016-11-03: 324 mg via ORAL
  Filled 2016-11-03: qty 4

## 2016-11-03 MED ORDER — NITROGLYCERIN 0.4 MG SL SUBL: 0.4000 mg | SUBLINGUAL_TABLET | SUBLINGUAL | Status: DC | PRN

## 2016-11-03 MED ORDER — SUCRALFATE 1 GM/10ML PO SUSP
1.0000 g | Freq: Three times a day (TID) | ORAL | Status: DC
  Administered 2016-11-05: 1 g via ORAL
  Filled 2016-11-03 (×7): qty 10

## 2016-11-03 MED ORDER — SODIUM CHLORIDE 0.9 % IV SOLN: 250.0000 mL | INTRAVENOUS | Status: DC | PRN

## 2016-11-03 MED ORDER — CALCIUM ACETATE (PHOS BINDER) 667 MG PO CAPS: 1334.0000 mg | ORAL_CAPSULE | ORAL | Status: DC | PRN

## 2016-11-03 MED ORDER — PANTOPRAZOLE SODIUM 40 MG PO TBEC: 40.0000 mg | DELAYED_RELEASE_TABLET | Freq: Every day | ORAL | Status: DC

## 2016-11-03 MED ORDER — DORZOLAMIDE HCL-TIMOLOL MAL 2-0.5 % OP SOLN
1.0000 [drp] | Freq: Two times a day (BID) | OPHTHALMIC | Status: DC
  Administered 2016-11-03 – 2016-11-06 (×4): 1 [drp] via OPHTHALMIC
  Filled 2016-11-03: qty 10

## 2016-11-03 MED ORDER — ACETAMINOPHEN 500 MG PO TABS: 500.0000 mg | ORAL_TABLET | Freq: Four times a day (QID) | ORAL | Status: DC | PRN

## 2016-11-03 MED ORDER — POLYETHYLENE GLYCOL 3350 17 G PO PACK: 17.0000 g | PACK | Freq: Every day | ORAL | Status: DC | PRN

## 2016-11-03 MED ORDER — INSULIN ASPART 100 UNIT/ML ~~LOC~~ SOLN
1.0000 [IU] | Freq: Three times a day (TID) | SUBCUTANEOUS | Status: DC
  Administered 2016-11-05: 2 [IU] via SUBCUTANEOUS
  Administered 2016-11-05 – 2016-11-06 (×2): 1 [IU] via SUBCUTANEOUS

## 2016-11-03 MED ORDER — ACETAMINOPHEN 650 MG RE SUPP: 650.0000 mg | Freq: Four times a day (QID) | RECTAL | Status: DC | PRN

## 2016-11-03 MED ORDER — NEPRO/CARBSTEADY PO LIQD
237.0000 mL | Freq: Two times a day (BID) | ORAL | Status: DC
  Filled 2016-11-03 (×4): qty 237

## 2016-11-03 MED ORDER — REPAGLINIDE 0.5 MG PO TABS: 0.5000 mg | ORAL_TABLET | Freq: Two times a day (BID) | ORAL | Status: DC

## 2016-11-03 MED ORDER — ASPIRIN EC 81 MG PO TBEC
81.0000 mg | DELAYED_RELEASE_TABLET | Freq: Every day | ORAL | Status: DC
  Administered 2016-11-04 – 2016-11-06 (×3): 81 mg via ORAL
  Filled 2016-11-03 (×4): qty 1

## 2016-11-03 MED ORDER — METOPROLOL TARTRATE 50 MG PO TABS
50.0000 mg | ORAL_TABLET | ORAL | Status: DC
  Administered 2016-11-05: 50 mg via ORAL
  Filled 2016-11-03: qty 2
  Filled 2016-11-03: qty 1

## 2016-11-03 MED ORDER — ONDANSETRON HCL 4 MG PO TABS: 4.0000 mg | ORAL_TABLET | Freq: Four times a day (QID) | ORAL | Status: DC | PRN

## 2016-11-03 NOTE — ED Notes (Addendum)
pt moaning, unable to tell this writer what is wrong. When asked, pt denies pain

## 2016-11-03 NOTE — ED Notes (Signed)
Patient transported to X-ray 

## 2016-11-03 NOTE — H&P (Signed)
Patient Demographics:    [name], is a 71 y.o. female  MRN: [id number]   DOB - 1945/02/23  Admit Date - 11/03/2016  Outpatient Primary MD for the patient is Plotnikov, Evie Lacks, MD   Assessment & Plan:    Principal Problem:   Syncope Active Problems:   Hypertensive heart disease   Anemia   ESRD on dialysis [facility])   S/P transmetatarsal amputation of foot (HCC)   Elevated troponin   Congestive heart failure (CHF) ([place])  LHC 08/18/16   Dist LAD lesion, 100 %stenosed.  1st Diag lesion, 0 %stenosed.  Ost 2nd Diag to 2nd Diag lesion, 70 %stenosed.  Ost 1st Mrg to 1st Mrg lesion, 80 %stenosed.  Mid RCA lesion, 99 %stenosed.  Ost RPDA to RPDA lesion, 100 %stenosed.  LV end diastolic pressure is normal.   1. 3 vessel obstructive CAD    - CTO of the distal LAD with left to left collaterals    - 80% ostial OM1    - 99% CTO of the mid RCA 2. Normal LVEDP 16 mm Hg   Echocardiogram from 10/31/16 with EF of 25-30%, please note that prior EF was 35-40%   Plan:- 1)Syncope-suspect vasovagal episode after recurrent emesis Versus Hypoglycemia related, As per ED physician report apparently patient's blood sugar was low at the hemodialysis center after she was found unresponsive. We will  place on  telemetry monitored unit, watch for arrhythmias, check serial troponins and EKG for rule out ACS protocol, echocardiogram from 10/31/2016 without significant aortic stenosis or other outflow obstruction,  EF was 25-30% and without segmental/Regional wall motion abnormalities.  If there is significant elevation of troponin please consult the cardiology service in the a.m on 11/04/16, otherwise if patient rules out for ACS she may be discharged home on 11/04/2016 after hemodialysis session  2)ESRD-patient usually gets  hemodialysis on Monday Wednesday,  discussed with on-call nephrologist for coronary kidney Dr. Augustin Coupe, plan is for hemodialysis on 11/04/2016  3)H/o Cardiomyopathy/CAD-initiate rule out as above #1, last known EF 25-30% based on echo from 10/31/2016, left heart catheterization from 08/18/2016 with multivessel CAD as noted above, but nothing that is amenable to revascularization, medical management was previously advised (previously cardiologist was unsuccessful at revascularization/PCI of proximal RCA), continue metoprolol on nondialysis days, continue simvastatin   4)Leukocytosis-no evidence of sepsis, patient had emesis, abdominal x-ray is pending,  chest x-ray without acute infectious process, patient without fevers, UA pending, hold off on antibiotics at this time pending cultures  5)DM- Hold Prandin ,  use modified very low dose sliding scale insulin as ordered,  As per ED physician report apparently patient's blood sugar was low at the hemodialysis center after she was found unresponsive, Allow some permissive Hyperglycemia rather than risk life-threatening hypoglycemia in a patient with unresponsive episode presumably secondary to low blood sugar  6)H/o Atrial Tachycardia-status post previous ablation by Dr Curt Bears on 11/17/15, currently off amiodarone, continue metoprolol on nondialysis days  With History of - Reviewed by me  Past Medical History:  Diagnosis Date  . Acute respiratory failure with hypoxia ([place]) 10/23/2014  . Adenomatous duodenal polyp   . Anemia   . Arthritis    "hands" (11/17/2015)  . Atrial tachycardia ([place])    on amiod  . Cardiomyopathy- mixed   . CHF (congestive heart failure) ([place])   . Chronic diastolic heart failure ([place])   . Colon cancer ([place]) 1986  . Colostomy in place [facility])   . Coronary artery disease    Previously decreased EF; echo 113 normal LV function  . Dialysis patient ([place])   . ESRD (end stage renal disease) on dialysis [facility])    Dr Dunham/Dr.  Lyda Kalata.  M, W, Fr; [place] (11/17/2015)  . GERD (gastroesophageal reflux disease)   . Glaucoma   . Heart murmur   . Hernia, incisional    abd  . Hyperlipidemia   . Hypertension   . Hypertensive heart disease    sees Dr. Alain Marion  . LBBB (left bundle branch block)   . Mitral valve insufficiency and aortic valve insufficiency   . Obesity   . Stroke [facility])    2011/12  . Type II diabetes mellitus ([place])       Past Surgical History:  Procedure Laterality Date  . ARTERIOVENOUS GRAFT PLACEMENT Left 2010   "forearm"  . BALLOON DILATION N/A 08/17/2016   Procedure: POSSIBLE BALLOON DILATION POSSIBLE MALONEY;  Surgeon: Gatha Mayer, MD;  Location: [facility] ENDOSCOPY;  Service: Endoscopy;  Laterality: N/A;  . CARDIAC CATHETERIZATION    . CARDIAC CATHETERIZATION N/A 10/27/2014   Procedure: Left Heart Cath and Coronary Angiography;  Surgeon: Leonie Man, MD;  Location: [place] CV LAB;  Service: Cardiovascular;  Laterality: N/A;  . CARDIAC CATHETERIZATION N/A 10/28/2014   Procedure: Coronary Stent Intervention;  Surgeon: Peter M Martinique, MD;  Location: [place] CV LAB;  Service: Cardiovascular;  Laterality: N/A;  . CARDIAC CATHETERIZATION Bilateral 12/2008   R. heart cath showed elevated left and right heart filling pressures w/ pulmonary artery pressure elevated mildly out of proportion to the wedge. The left heart cath showed diffuse distal vessel disease as well as a 75% stenosis in the mid circumflex w/ a 90% stenosis of the ostial first obtuse marginal. These lesions were in close proximity. there was a 60-70%mild RCA stenosis.   [name] CATARACT EXTRACTION W/ INTRAOCULAR LENS  IMPLANT, BILATERAL Bilateral   . COLON SURGERY    . COLONOSCOPY    . COLOSTOMY  1986  . ELECTROCARDIOGRAM  04-27-06  . ELECTROPHYSIOLOGIC STUDY N/A 11/17/2015   Procedure: A-Tach Ablation;  Surgeon: Will Meredith Leeds, MD;  Location: [place] CV LAB;  Service: Cardiovascular;  Laterality: N/A;  .  ESOPHAGOGASTRODUODENOSCOPY  03-18-04  . ESOPHAGOGASTRODUODENOSCOPY N/A 02/02/2013   Procedure: ESOPHAGOGASTRODUODENOSCOPY (EGD);  Surgeon: Irene Shipper, MD;  Location[facility] ENDOSCOPY;  Service: Endoscopy;  Laterality: N/A;  . ESOPHAGOGASTRODUODENOSCOPY (EGD) WITH PROPOFOL N/A 08/17/2016   Procedure: ESOPHAGOGASTRODUODENOSCOPY (EGD) WITH PROPOFOL;  Surgeon: Gatha Mayer, MD;  Location: [facility] ENDOSCOPY;  Service: Endoscopy;  Laterality: N/A;   NO XRAY NEEDED  . EYE SURGERY    . FOOT AMPUTATION THROUGH METATARSAL  10-07-10   Right foot transmetatarsal  . INSERTION OF AHMED VALVE Right 08/20/2013   Procedure: INSERTION OF AHMED VALVE WITH Mitomycin C application;  Surgeon: Marylynn Pearson, MD;  Location: [place];  Service: Ophthalmology;  Laterality: Right;  . INSERTION OF AHMED VALVE Left 07/22/2014   Procedure:  INSERTION OF AHMED VALVE WITH [place] MITOMYCIN;  Surgeon: Marylynn Pearson, MD;  Location: [place];  Service: Ophthalmology;  Laterality: Left;  . LEFT HEART CATH AND CORONARY ANGIOGRAPHY N/A 08/18/2016   Procedure: LEFT HEART CATH AND CORONARY ANGIOGRAPHY;  Surgeon: Martinique, Peter M, MD;  Location: [place] CV LAB;  Service: Cardiovascular;  Laterality: N/A;  . MITOMYCIN C APPLICATION Left 08/21/7515   Procedure: MITOMYCIN C APPLICATION;  Surgeon: Marylynn Pearson, MD;  Location: [place];  Service: Ophthalmology;  Laterality: Left;  . PARS PLANA VITRECTOMY  11/29/2011   Procedure: PARS PLANA VITRECTOMY WITH 23 GAUGE;  Surgeon: Adonis Brook, MD;  Location: [place];  Service: Ophthalmology;  Laterality: Right;  Right Eye 23 ga vitrectomy with membrane peel  . PARS PLANA VITRECTOMY Left 02/28/2012   Procedure: PARS PLANA VITRECTOMY WITH 23 GAUGE;  Surgeon: Adonis Brook, MD;  Location: [place];  Service: Ophthalmology;  Laterality: Left;  . REVISION UROSTOMY CUTANEOUS    . SUPRAVENTRICULAR TACHYCARDIA ABLATION  11/17/2015  . VAGINAL HYSTERECTOMY        Chief Complaint  Patient presents with  . Loss of Consciousness       HPI:    [name]  is a 71 y.o. female with history of ESRF on HD, HTN, DM, LBBB, PAT, CHF and CAD who presents from hemodialysis center after a "syncopal" episode.  Apparently patient was in to get hemodialysis when she started to vomit and then passed out after vomiting, when she came around she continued to have nausea and emesis.  No chest pains no palpitations no dizziness.   Patient states she just does not feel well, no leg pain or pleuritic symptoms  As per ED physician report apparently patient's blood sugar was low at the hemodialysis center after she was found unresponsive, however it appears that are explained to the patient was triggered by persistent emesis.    In ED..... No further emesis, abdominal x-rays pending, denies abdominal pain no fever ,    Review of systems:    In addition to the HPI above,   A full 12 point Review of 10 Systems was done, except as stated above, all other Review of 10 Systems were negative.    Social History:  Reviewed by me    Social History  Substance Use Topics  . Smoking status: Never Smoker  . Smokeless tobacco: Never Used  . Alcohol use No       Family History :  Reviewed by me    Family History  Problem Relation Age of Onset  . Hypertension Mother   . Coronary artery disease Mother   . Hypertension Father   . Diabetes Father   . Cancer Sister        colon  . Hypertension Other      Home Medications:   Prior to Admission medications   Medication Sig Start Date End Date Taking? Authorizing Provider  acetaminophen (TYLENOL) 500 MG tablet Take 500-1,000 mg by mouth every 6 (six) hours as needed (pain).    Yes [provider]  aspirin 81 MG tablet Take 1 tablet (81 mg total) by mouth daily. 10/29/14  Yes Dhungel, Nishant, MD  calcium acetate (PHOSLO) 667 MG capsule Take 1,334-2,001 mg by mouth See admin instructions. Take 2001mg  three times daily with meals and Take 1334mg   with snacks.   Yes [provider]  dorzolamide-timolol (COSOPT) 22.3-6.8 MG/ML ophthalmic solution Place 1 drop into the left eye 2 (two) times daily. 11/09/15  Yes [provider]  glucose blood (ONE TOUCH ULTRA TEST) test strip 1 each by Other route daily. And lancets 1/day 03/16/16  Yes Renato Shin, MD  lidocaine-prilocaine (EMLA) cream Apply 1 application topically as needed (topical anesthesia for hemodialysis if Gebauers and Lidocaine injection are ineffective.). 08/20/16  Yes Allie Bossier, MD  metoprolol tartrate (LOPRESSOR) 50 MG tablet Take 1 tablet (50 mg total) by mouth every Tuesday, Thursday, Saturday, and Sunday. 08/20/16  Yes Allie Bossier, MD  repaglinide (PRANDIN) 0.5 MG tablet TAKE 1 TABLET BY MOUTH THREE TIMES DAILY BEFORE MEALS 03/16/16  Yes Renato Shin, MD  simvastatin (ZOCOR) 10 MG tablet Take 1 tablet (10 mg total) by mouth every evening. 05/23/16  Yes Camnitz, Will Hassell Done, MD  Fluticasone-Salmeterol (ADVAIR) 250-50 MCG/DOSE AEPB Inhale 1 puff into the lungs daily as needed (shortness of breath).  05/28/12   Plotnikov, Evie Lacks, MD  nitroGLYCERIN (NITROSTAT) 0.4 MG SL tablet Place 1 tablet (0.4 mg total) under the tongue every 5 (five) minutes as needed for chest pain. 10/29/14   Dhungel, Flonnie Overman, MD  pantoprazole (PROTONIX) 40 MG tablet Take 1 tablet (40 mg total) by mouth daily before breakfast. Patient not taking: Reported on 11/03/2016 07/25/16   Gatha Mayer, MD  sucralfate (CARAFATE) 1 GM/10ML suspension Take 10 mLs (1 g total) by mouth 4 (four) times daily -  with meals and at bedtime. Patient not taking: Reported on 11/03/2016 08/20/16   Allie Bossier, MD     Allergies:     Allergies  Allergen Reactions  . Ace Inhibitors Cough  . Eggs Or Egg-Derived Products Nausea And Vomiting  . Enalapril Cough  . Lisinopril Cough  . Omnipaque [Iohexol] Hives     Physical Exam:   Vitals  Blood pressure (!) 111/57, pulse (!) 110, temperature (!) 97.4 F (36.3 C), temperature  source Oral, resp. rate 18, height 5' (1.524 m), weight 74.8 kg (165 lb), SpO2 98 %.  Physical Examination: General appearance - alert, well appearing, and in no distress  Mental status - alert, oriented to person, place, and time,  Eyes - sclera anicteric Neck - supple, no JVD elevation , Chest -diminished in bases, no wheezes,  heart - S1 and S2 normal,  Abdomen - soft, nontender, nondistended, urostomy (dark urine) and colostomy bag with fecal contents noted Neurological -  neck supple without rigidity, cranial nerves II through XII intact, DTR's normal and symmetric Extremities -1+ pedal edema noted, prior transmetatarsal amputation, left arm AV fistula with positive thrill and bruit Skin - warm, dry    Data Review:    CBC  Recent Labs Lab 11/03/16 1544 11/03/16 1550  WBC 14.6*  --   HGB 13.1 15.0  HCT 41.0 44.0  PLT 142*  --   MCV 89.7  --   MCH 28.7  --   MCHC 32.0  --   RDW 17.2*  --   LYMPHSABS 0.5*  --   MONOABS 1.2*  --   EOSABS 0.0  --   BASOSABS 0.0  --    ------------------------------------------------------------------------------------------------------------------  Chemistries   Recent Labs Lab 11/03/16 1544 11/03/16 1550  NA 141 140  K 4.0 3.9  CL 100* 102  CO2 22  --   GLUCOSE 110* 116*  BUN 22* 25*  CREATININE 4.83* 4.40*  CALCIUM 9.2  --   AST 25  --   ALT 10*  --   ALKPHOS 97  --   BILITOT 1.5*  --    ------------------------------------------------------------------------------------------------------------------  estimated creatinine clearance is 10.7 mL/min (A) (by C-G formula based on SCr of 4.4 mg/dL (H)). ------------------------------------------------------------------------------------------------------------------ No results for input(s): TSH, T4TOTAL, T3FREE, THYROIDAB in the last 72 hours.  Invalid input(s): FREET3   Coagulation profile No results for input(s): INR, PROTIME in the last 168  hours. ------------------------------------------------------------------------------------------------------------------- No results for input(s): DDIMER in the last 72 hours. -------------------------------------------------------------------------------------------------------------------  Cardiac Enzymes No results for input(s): CKMB, TROPONINI, MYOGLOBIN in the last 168 hours.  Invalid input(s): CK ------------------------------------------------------------------------------------------------------------------    Component Value Date/Time   BNP >4,500.0 (H) 07/23/2016 1640     ---------------------------------------------------------------------------------------------------------------  Urinalysis    Component Value Date/Time   COLORURINE YELLOW 11/03/2016 1641   APPEARANCEUR TURBID (A) 11/03/2016 1641   LABSPEC 1.012 11/03/2016 1641   PHURINE 7.0 11/03/2016 1641   GLUCOSEU NEGATIVE 11/03/2016 1641   HGBUR LARGE (A) 11/03/2016 1641   BILIRUBINUR NEGATIVE 11/03/2016 1641   KETONESUR NEGATIVE 11/03/2016 1641   PROTEINUR 100 (A) 11/03/2016 1641   UROBILINOGEN 4.0 (H) 05/20/2011 2219   NITRITE NEGATIVE 11/03/2016 1641   LEUKOCYTESUR LARGE (A) 11/03/2016 1641    ----------------------------------------------------------------------------------------------------------------   Imaging Results:    Dg Chest Portable 1 View  Result Date: 11/03/2016 CLINICAL DATA:  Syncope and weakness.  End-stage renal disease. EXAM: PORTABLE CHEST 1 VIEW COMPARISON:  08/17/2016 and 10/23/2014 FINDINGS: Lungs are adequately inflated without focal airspace consolidation or effusion. Minimal prominence of the right hilar region unchanged. Moderate stable cardiomegaly. Calcified plaque over the thoracic aorta. Remainder of the exam is unchanged. IMPRESSION: No acute cardiopulmonary disease. Moderate stable cardiomegaly. Aortic atherosclerosis. Electronically Signed   By: Marin Olp M.D.   On:  11/03/2016 13:44    Radiological Exams on Admission: Dg Chest Portable 1 View  Result Date: 11/03/2016 CLINICAL DATA:  Syncope and weakness.  End-stage renal disease. EXAM: PORTABLE CHEST 1 VIEW COMPARISON:  08/17/2016 and 10/23/2014 FINDINGS: Lungs are adequately inflated without focal airspace consolidation or effusion. Minimal prominence of the right hilar region unchanged. Moderate stable cardiomegaly. Calcified plaque over the thoracic aorta. Remainder of the exam is unchanged. IMPRESSION: No acute cardiopulmonary disease. Moderate stable cardiomegaly. Aortic atherosclerosis. Electronically Signed   By: Marin Olp M.D.   On: 11/03/2016 13:44    DVT Prophylaxis -SCD   AM Labs Ordered, also please review Full Orders  Family Communication: Admission, patients condition and plan of care including tests being ordered have been discussed with the patient who indicate understanding and agree with the plan   Code Status - Full Code  Likely DC to  home  Condition   stable  Robb Sibal M.D on 11/03/2016 at 5:58 PM   Between 7am to 7pm - Pager - [phone] After 7pm go to [email or web address] - password [email or web address]  [facility] - Office  [phone]  Voice Recognition Viviann Spare dictation system was used to create this note, attempts have been made to correct errors. Please contact the author with questions and/or clarifications.

## 2016-11-03 NOTE — ED Notes (Signed)
Phleb unable to collect labs at this time. Will consult a second phlebotomist to attempt with blood draw.

## 2016-11-03 NOTE — ED Provider Notes (Signed)
[facility] EMERGENCY DEPARTMENT Provider Note   CSN: [id number] Arrival date & time: 11/03/16  1259     History   Chief Complaint Chief Complaint  Patient presents with  . Loss of Consciousness    HPI [name] is a 71 y.o. female.  HPI Patient sent in after a syncopal episode at dialysis.  She was walking back to receive dialysis.  Had had some nausea and vomiting then states she been chilled.  States she was cold so she ate some ice.  States she then got colder.  Then had more throwing up and became unresponsive for about 1 minute.  More vomiting after.  Reportedly had low blood pressure at the dialysis center.  Denies chest pain.  States she has not been eating much the last couple days.  Denies cough.  She was last dialyzed on Wednesday.  Has had previous heart stents and had cardiac arrest with sedation. Past Medical History:  Diagnosis Date  . Acute respiratory failure with hypoxia ([place]) 10/23/2014  . Adenomatous duodenal polyp   . Anemia   . Arthritis    "hands" (11/17/2015)  . Atrial tachycardia ([place])    on amiod  . Cardiomyopathy- mixed   . CHF (congestive heart failure) ([place])   . Chronic diastolic heart failure ([place])   . Colon cancer ([place]) 1986  . Colostomy in place [facility])   . Coronary artery disease    Previously decreased EF; echo 113 normal LV function  . Dialysis patient ([place])   . ESRD (end stage renal disease) on dialysis [facility])    Dr Dunham/Dr. Lyda Kalata.  M, W, Fr; [place] (11/17/2015)  . GERD (gastroesophageal reflux disease)   . Glaucoma   . Heart murmur   . Hernia, incisional    abd  . Hyperlipidemia   . Hypertension   . Hypertensive heart disease    sees Dr. Alain Marion  . LBBB (left bundle branch block)   . Mitral valve insufficiency and aortic valve insufficiency   . Obesity   . Stroke [facility])    2011/12  . Type II diabetes mellitus [facility])     Patient Active Problem List   Diagnosis Date Noted  . PEA (Pulseless  electrical activity) ([place])   . Chronic systolic CHF (congestive heart failure) ([place])   . Postprocedural hypotension   . End-stage renal disease on hemodialysis ([place])   . Cardiac arrest ([place]) 08/17/2016  . Erosive esophagitis   . Esophageal dysphagia   . Hematemesis with nausea   . Gastritis and gastroduodenitis   . Hard of hearing 04/25/2016  . GERD (gastroesophageal reflux disease) 02/08/2016  . Loss of weight 01/18/2016  . Atrial tachycardia ([place]) 11/17/2015  . Diabetes ([place]) 05/13/2015  . Ischemic cardiomyopathy   . Non-ST elevated myocardial infarction (non-STEMI) ([place])   . Dyspnea on exertion 10/23/2014  . Elevated troponin 10/23/2014  . Congestive heart failure (CHF) ([place]) 10/23/2014  . GI bleed 02/02/2013  . Chest pain 02/01/2013  . 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 History of colon cancer   . Obesity   . ESRD on dialysis ([place]) 05/28/2012  . Nausea 05/21/2011  . Anemia 05/21/2011  . Coronary artery disease   . CVA (cerebral infarction) 06/01/2010  . Acute on chronic systolic and diastolic heart failure, NYHA class 1 ([place]) 01/27/2009  . Atrial tachycardia    . LBBB (left bundle branch block) 04/10/2008  . Hyperlipidemia   . Hypertensive heart disease  Past Surgical History:  Procedure Laterality Date  . ARTERIOVENOUS GRAFT PLACEMENT Left 2010   "forearm"  . BALLOON DILATION N/A 08/17/2016   Procedure: POSSIBLE BALLOON DILATION POSSIBLE MALONEY;  Surgeon: Gatha Mayer, MD;  Location: [facility];  Service: Endoscopy;  Laterality: N/A;  . CARDIAC CATHETERIZATION    . CARDIAC CATHETERIZATION N/A 10/27/2014   Procedure: Left Heart Cath and Coronary Angiography;  Surgeon: Leonie Man, MD;  Location: [place] CV LAB;  Service: Cardiovascular;  Laterality: N/A;  . CARDIAC CATHETERIZATION N/A 10/28/2014   Procedure: Coronary Stent Intervention;  Surgeon: Peter M Martinique, MD;  Location: [place] CV LAB;  Service: Cardiovascular;   Laterality: N/A;  . CARDIAC CATHETERIZATION Bilateral 12/2008   R. heart cath showed elevated left and right heart filling pressures w/ pulmonary artery pressure elevated mildly out of proportion to the wedge. The left heart cath showed diffuse distal vessel disease as well as a 75% stenosis in the mid circumflex w/ a 90% stenosis of the ostial first obtuse marginal. These lesions were in close proximity. there was a 60-70%mild RCA stenosis.   [name] CATARACT EXTRACTION W/ INTRAOCULAR LENS  IMPLANT, BILATERAL Bilateral   . COLON SURGERY    . COLONOSCOPY    . COLOSTOMY  1986  . ELECTROCARDIOGRAM  04-27-06  . ELECTROPHYSIOLOGIC STUDY N/A 11/17/2015   Procedure: A-Tach Ablation;  Surgeon: Will Meredith Leeds, MD;  Location: [place] CV LAB;  Service: Cardiovascular;  Laterality: N/A;  . ESOPHAGOGASTRODUODENOSCOPY  03-18-04  . ESOPHAGOGASTRODUODENOSCOPY N/A 02/02/2013   Procedure: ESOPHAGOGASTRODUODENOSCOPY (EGD);  Surgeon: Irene Shipper, MD;  Location: [facility] ENDOSCOPY;  Service: Endoscopy;  Laterality: N/A;  . ESOPHAGOGASTRODUODENOSCOPY (EGD) WITH PROPOFOL N/A 08/17/2016   Procedure: ESOPHAGOGASTRODUODENOSCOPY (EGD) WITH PROPOFOL;  Surgeon: Gatha Mayer, MD;  [facility];  Service: Endoscopy;  Laterality: N/A;   NO XRAY NEEDED  . EYE SURGERY    . FOOT AMPUTATION THROUGH METATARSAL  10-07-10   Right foot transmetatarsal  . INSERTION OF AHMED VALVE Right 08/20/2013   Procedure: INSERTION OF AHMED VALVE WITH Mitomycin C application;  Surgeon: Marylynn Pearson, MD;  Location: [facility];  Service: Ophthalmology;  Laterality: Right;  . INSERTION OF AHMED VALVE Left 07/22/2014   Procedure: INSERTION OF AHMED VALVE WITH [place];  Surgeon: Marylynn Pearson, MD;  Location: [facility];  Service: Ophthalmology;  Laterality: Left;  . LEFT HEART CATH AND CORONARY ANGIOGRAPHY N/A 08/18/2016   Procedure: LEFT HEART CATH AND CORONARY ANGIOGRAPHY;  Surgeon: Martinique, Peter M, MD;  Location: [place] CV LAB;  Service:  Cardiovascular;  Laterality: N/A;  . MITOMYCIN C APPLICATION Left 5/63/1497   Procedure: MITOMYCIN C APPLICATION;  Surgeon: Marylynn Pearson, MD;  Location: [place];  Service: Ophthalmology;  Laterality: Left;  . PARS PLANA VITRECTOMY  11/29/2011   Procedure: PARS PLANA VITRECTOMY WITH 23 GAUGE;  Surgeon: Adonis Brook, MD;  Location: [facility];  Service: Ophthalmology;  Laterality: Right;  Right Eye 23 ga vitrectomy with membrane peel  . PARS PLANA VITRECTOMY Left 02/28/2012   Procedure: PARS PLANA VITRECTOMY WITH 23 GAUGE;  Surgeon: Adonis Brook, MD;  Location: [facility];  Service: Ophthalmology;  Laterality: Left;  . REVISION UROSTOMY CUTANEOUS    . SUPRAVENTRICULAR TACHYCARDIA ABLATION  11/17/2015  . VAGINAL HYSTERECTOMY      OB History    No data available       Home Medications    Prior to Admission medications   Medication Sig Start Date End Date Taking? Authorizing Provider  acetaminophen (TYLENOL) 500 MG tablet Take 500-1,000 mg by mouth every 6 (six) hours as needed (pain).    Yes [provider]  aspirin 81 MG tablet Take 1 tablet (81 mg total) by mouth daily. 10/29/14  Yes Dhungel, Nishant, MD  calcium acetate (PHOSLO) 667 MG capsule Take 1,334-2,001 mg by mouth See admin instructions. Take 2001mg  three times daily with meals and Take 1334mg   with snacks.   Yes [provider]  dorzolamide-timolol (COSOPT) 22.3-6.8 MG/ML ophthalmic solution Place 1 drop into the left eye 2 (two) times daily. 11/09/15  Yes [provider]  glucose blood (ONE TOUCH ULTRA TEST) test strip 1 each by Other route daily. And lancets 1/day 03/16/16  Yes Renato Shin, MD  lidocaine-prilocaine (EMLA) cream Apply 1 application topically as needed (topical anesthesia for hemodialysis if Gebauers and Lidocaine injection are ineffective.). 08/20/16  Yes Allie Bossier, MD  metoprolol tartrate (LOPRESSOR) 50 MG tablet Take 1 tablet (50 mg total) by mouth every Tuesday, Thursday, Saturday, and  Sunday. 08/20/16  Yes Allie Bossier, MD  repaglinide (PRANDIN) 0.5 MG tablet TAKE 1 TABLET BY MOUTH THREE TIMES DAILY BEFORE MEALS 03/16/16  Yes Renato Shin, MD  simvastatin (ZOCOR) 10 MG tablet Take 1 tablet (10 mg total) by mouth every evening. 05/23/16  Yes Camnitz, Will Hassell Done, MD  Fluticasone-Salmeterol (ADVAIR) 250-50 MCG/DOSE AEPB Inhale 1 puff into the lungs daily as needed (shortness of breath).  05/28/12   Plotnikov, Evie Lacks, MD  nitroGLYCERIN (NITROSTAT) 0.4 MG SL tablet Place 1 tablet (0.4 mg total) under the tongue every 5 (five) minutes as needed for chest pain. 10/29/14   Dhungel, Flonnie Overman, MD  pantoprazole (PROTONIX) 40 MG tablet Take 1 tablet (40 mg total) by mouth daily before breakfast. Patient not taking: Reported on 11/03/2016 07/25/16   Gatha Mayer, MD  sucralfate (CARAFATE) 1 GM/10ML suspension Take 10 mLs (1 g total) by mouth 4 (four) times daily -  with meals and at bedtime. Patient not taking: Reported on 11/03/2016 08/20/16   Allie Bossier, MD    Family History Family History  Problem Relation Age of Onset  . Hypertension Mother   . Coronary artery disease Mother   . Hypertension Father   . Diabetes Father   . Cancer Sister        colon  . Hypertension Other     Social History Social History  Substance Use Topics  . Smoking status: Never Smoker  . Smokeless tobacco: Never Used  . Alcohol use No     Allergies   Ace inhibitors; Eggs or egg-derived products; Enalapril; Lisinopril; and Omnipaque [iohexol]   Review of Systems Review of Systems  Constitutional: Positive for appetite change and chills.  HENT: Negative for congestion.   Respiratory: Negative for shortness of breath.   Cardiovascular: Negative for chest pain.  Gastrointestinal: Positive for nausea and vomiting. Negative for abdominal pain.  Genitourinary: Negative for flank pain.  Musculoskeletal: Negative for back pain.  Skin: Negative for rash.  Neurological: Positive for syncope.   Hematological: Negative for adenopathy.  Psychiatric/Behavioral: Negative for confusion.     Physical Exam Updated Vital Signs BP (!) 120/59   Pulse (!) 104   Temp (!) 97.4 F (36.3 C) (Oral)   Resp (!) 8   Ht 5' (1.524 m)   Wt 74.8 kg (165 lb)   SpO2 99%   BMI 32.22 kg/m   Physical Exam  Constitutional: She appears well-developed.  HENT:  Head: Normocephalic.  Eyes:  Pupils are equal, round, and reactive to light.  Right eye mildly disconjugate compared to left.  This is chronic.  Neck: Neck supple. JVD present.  Cardiovascular:  Mild tachycardia  Pulmonary/Chest: Effort normal. She has no rales.  Abdominal: Soft. There is no tenderness.  Patient has colostomy  Musculoskeletal: She exhibits edema.  Edema to bilateral lower extremities  Skin: Skin is warm. Capillary refill takes less than 2 seconds.     ED Treatments / Results  Labs (all labs ordered are listed, but only abnormal results are displayed) Labs Reviewed  COMPREHENSIVE METABOLIC PANEL  CBC WITH DIFFERENTIAL/PLATELET  I-STAT CHEM 8, ED  I-STAT TROPONIN, ED    EKG  EKG Interpretation  Date/Time:  Friday November 03 2016 13:07:29 EDT Ventricular Rate:  119 PR Interval:    QRS Duration: 160 QT Interval:  430 QTC Calculation: 606 R Axis:   -61 Text Interpretation:  Junctional tachycardia Left bundle branch block Confirmed by Davonna Belling [phone]) on 11/03/2016 1:16:09 PM Also confirmed by Davonna Belling [phone])  on 11/03/2016 3:20:23 PM       Radiology Dg Chest Portable 1 View  Result Date: 11/03/2016 CLINICAL DATA:  Syncope and weakness.  End-stage renal disease. EXAM: PORTABLE CHEST 1 VIEW COMPARISON:  08/17/2016 and 10/23/2014 FINDINGS: Lungs are adequately inflated without focal airspace consolidation or effusion. Minimal prominence of the right hilar region unchanged. Moderate stable cardiomegaly. Calcified plaque over the thoracic aorta. Remainder of the exam is unchanged.  IMPRESSION: No acute cardiopulmonary disease. Moderate stable cardiomegaly. Aortic atherosclerosis. Electronically Signed   By: Marin Olp M.D.   On: 11/03/2016 13:44    Procedures Procedures (including critical care time)  Medications Ordered in ED Medications - No data to display   Initial Impression / Assessment and Plan / ED Course  I have reviewed the triage vital signs and the nursing notes.  Pertinent labs & imaging results that were available during my care of the patient were reviewed by me and considered in my medical decision making (see chart for details).     Patient presents with syncope.  Preceded by nausea and vomiting.  Happened at dialysis.  No chest pain however.  Now but does have known cardiac history.  He did not get dialysis.  QRS may be a little wider and P waves not present.  Labs pending.  Will likely require admission.  Care will be turned over to Dr. Reather Converse  Final Clinical Impressions(s) / ED Diagnoses   Final diagnoses:  Syncope, unspecified syncope type  End stage renal failure on dialysis [facility])    New Prescriptions New Prescriptions   No medications on file     Davonna Belling, MD 11/03/16 1527

## 2016-11-03 NOTE — Progress Notes (Signed)
Pt refused lab draw. Informed lab to come back when daughter gets here.

## 2016-11-03 NOTE — ED Notes (Addendum)
Unable to collect rectal temp as pt has a colostomy and no rectum. EDP made aware.

## 2016-11-03 NOTE — ED Triage Notes (Signed)
P arrived via [facility] from dialysis. Pt was not dialyzed today, was walking back to room to receive dialysis She had an episode of becoming weak, shivering, went unresponsive for 1 min in chair. Upon waking she had an episode of NV. Pt reported feeling well prior to dialysis. EMS reports that pt. Was tachypenic but denied SOB.

## 2016-11-03 NOTE — ED Notes (Signed)
Attempted IV X 2 left wrist

## 2016-11-03 NOTE — ED Notes (Signed)
ED Provider at bedside. 

## 2016-11-03 NOTE — ED Notes (Signed)
Got patient undress into a gown on the monitor did ekg shown to Dr Alvino Chapel patient is resting with call bell in reach and family at bedside got patient some warm blankets

## 2016-11-03 NOTE — Consult Note (Signed)
Reason for Consult:: ESRD Referring Physician: Dr. Denton Brick  Chief Complaint: Syncope  Assessment/Plan: 1. ESRD - MWF @ [facility] w/ Dr. Joelyn Oms last HD on Wed, EDW is 74.5kg, T4hr 3K 2Ca Heparin 3000 bolus, Calcitriol 62mg PO  TIW, last PTH 312, not on Mircera with last Hb >12. - Plan on HD first thing in the AM. - Will monitor closely with gentle UF to determine if she develops any near syncopal symptoms as [facility] was not able to HD her today bec of her symptoms but hypotension. No signs of infection. Appears to have a left BBF vs left bovine graft. 2. CASHD/CHF LHC 08/18/16 - 3V obstructive disease with CTO of distal LAD and severe OM1 and mid RCA lesions. Currently denies any CP or dyspnea. 3. Vomiting today but currently no nausea; reports only bilious type emesis. 4. Leukocytosis with no overt signs of infection and access certainly has no signs of obvious infection. 5. [place]    HPI: LALSACE DOWDis an 71y.o. [place] [facility] [place] HD Wed missing today bec she was found down and noted to be hypoglycemic and hypotensive at [facility]. She was actually never cannulated; she has had emesis with bilious vomitus but denies any fever, chills, chest pain, dyspnea, pain over the left HD access, diarrhea or abdominal pain. She has had a very poor appetite recently. KUB shows nonobstructive gas pattern.  ROS Pertinent items are noted in HPI.  Chemistry and CBC: Creat  Date/Time Value Ref Range Status  08/31/2015 12:03 PM 4.78 (H) 0.50 - 0.99 mg/dL Final    Comment:      For patients > or = 71years of age: The upper reference limit for Creatinine is approximately 13% higher for people identified as African-American.      Creatinine, Ser  Date/Time Value Ref Range Status  11/03/2016 03:50 PM 4.40 (H) 0.44 - 1.00 mg/dL Final  11/03/2016 03:44 PM 4.83 (H) 0.44 - 1.00 mg/dL Final  08/19/2016 09:40 AM 5.23 (H) 0.44 - 1.00 mg/dL Final  08/18/2016  09:38 PM 4.85 (H) 0.44 - 1.00 mg/dL Final  08/18/2016 06:49 AM 4.08 (H) 0.44 - 1.00 mg/dL Final  08/17/2016 11:40 AM 3.65 (H) 0.44 - 1.00 mg/dL Final  07/23/2016 04:40 PM 4.71 (H) 0.44 - 1.00 mg/dL Final  05/16/2016 11:10 AM 4.22 (H) 0.44 - 1.00 mg/dL Final  01/18/2016 10:27 AM 4.15 (H) 0.40 - 1.20 mg/dL Final  11/17/2015 12:24 PM 6.39 (H) 0.44 - 1.00 mg/dL Final  10/29/2014 05:05 AM 5.16 (H) 0.44 - 1.00 mg/dL Final  10/28/2014 03:49 AM 7.66 (H) 0.44 - 1.00 mg/dL Final  10/27/2014 03:51 AM 5.55 (H) 0.44 - 1.00 mg/dL Final  10/26/2014 05:21 AM 7.64 (H) 0.44 - 1.00 mg/dL Final  10/25/2014 05:45 AM 5.89 (H) 0.44 - 1.00 mg/dL Final  10/24/2014 05:19 AM 4.10 (H) 0.44 - 1.00 mg/dL Final    Comment:    DELTA CHECK NOTED  10/23/2014 01:27 PM 6.87 (H) 0.44 - 1.00 mg/dL Final  10/16/2013 10:27 AM 5.2 (HH) 0.4 - 1.2 mg/dL Final  09/22/2013 08:00 PM 8.16 (H) 0.50 - 1.10 mg/dL Final  02/02/2013 04:45 PM 4.93 (H) 0.50 - 1.10 mg/dL Final  02/02/2013 08:15 AM 4.18 (H) 0.50 - 1.10 mg/dL Final    Comment:    DELTA CHECK NOTED  02/01/2013 04:56 AM 7.63 (H) 0.50 - 1.10 mg/dL Final  01/31/2013 02:52 PM 6.59 (H) 0.50 - 1.10 mg/dL  Final  02/28/2012 07:23 AM 6.86 (H) 0.50 - 1.10 mg/dL Final  05/26/2011 03:06 PM 4.75 (H) 0.50 - 1.10 mg/dL Final  05/24/2011 01:28 PM 4.75 (H) 0.50 - 1.10 mg/dL Final  05/22/2011 05:23 AM 5.55 (H) 0.50 - 1.10 mg/dL Final  05/21/2011 07:15 AM 5.07 (H) 0.50 - 1.10 mg/dL Final  05/20/2011 08:16 PM 4.33 (H) 0.50 - 1.10 mg/dL Final  10/21/2010 04:49 PM 6.85 (H) 0.50 - 1.10 mg/dL Final  10/19/2010 05:14 PM 7.68 (H) 0.50 - 1.10 mg/dL Final  10/17/2010 01:24 PM 8.18 (H) 0.50 - 1.10 mg/dL Final  10/14/2010 08:11 PM 6.09 (H) 0.50 - 1.10 mg/dL Final  10/12/2010 08:16 AM 5.68 (H) 0.50 - 1.10 mg/dL Final    Comment:    DIALYSIS  10/11/2010 06:33 AM 3.74 (H) 0.50 - 1.10 mg/dL Final    Comment:    DELTA CHECK NOTED  10/10/2010 07:20 AM 5.89 (H) 0.50 - 1.10 mg/dL Final  10/09/2010 07:14  AM 3.94 (H) 0.50 - 1.10 mg/dL Final    Comment:    DELTA CHECK NOTED  10/08/2010 01:49 PM 6.49 (H) 0.50 - 1.10 mg/dL Final  10/07/2010 05:50 AM 4.44 (H) 0.50 - 1.10 mg/dL Final    Comment:    DELTA CHECK NOTED  10/06/2010 06:40 AM 7.77 (H) 0.50 - 1.10 mg/dL Final  10/05/2010 12:00 PM 7.21 (H) 0.50 - 1.10 mg/dL Final  10/04/2010 06:09 PM 5.00 (H) 0.50 - 1.10 mg/dL Final  10/03/2010 07:54 AM 6.55 (H) 0.50 - 1.10 mg/dL Final  10/01/2010 03:30 PM 4.54 (H) 0.50 - 1.10 mg/dL Final  10/01/2010 05:02 AM 3.84 (H) 0.50 - 1.10 mg/dL Final    Comment:    DELTA CHECK NOTED  09/30/2010 06:55 AM 6.89 (H) 0.50 - 1.10 mg/dL Final  09/29/2010 07:44 PM 6.57 (H) 0.50 - 1.10 mg/dL Final  05/28/2010 09:49 AM 6.57 (H) 0.4 - 1.2 mg/dL Final  05/18/2010 02:30 PM 6.19 (H) 0.4 - 1.2 mg/dL Final  05/17/2010 12:54 PM 6.79 (H) 0.4 - 1.2 mg/dL Final  05/16/2010 12:28 AM 8.64 (H) 0.4 - 1.2 mg/dL Final  01/12/2010 11:47 AM 8.76 (H) 0.4 - 1.2 mg/dL Final    Recent Labs Lab 11/03/16 1544 11/03/16 1550  NA 141 140  K 4.0 3.9  CL 100* 102  CO2 22  --   GLUCOSE 110* 116*  BUN 22* 25*  CREATININE 4.83* 4.40*  CALCIUM 9.2  --     Recent Labs Lab 11/03/16 1544 11/03/16 1550  WBC 14.6*  --   NEUTROABS 12.9*  --   HGB 13.1 15.0  HCT 41.0 44.0  MCV 89.7  --   PLT 142*  --    Liver Function Tests:  Recent Labs Lab 11/03/16 1544  AST 25  ALT 10*  ALKPHOS 97  BILITOT 1.5*  PROT 7.1  ALBUMIN 3.3*   No results for input(s): LIPASE, AMYLASE in the last 168 hours. No results for input(s): AMMONIA in the last 168 hours. Cardiac Enzymes: No results for input(s): CKTOTAL, CKMB, CKMBINDEX, TROPONINI in the last 168 hours. Iron Studies: No results for input(s): IRON, TIBC, TRANSFERRIN, FERRITIN in the last 72 hours. PT/INR: @LABRCNTIP (inr:5)  Xrays/Other Studies: ) Results for orders placed or performed during the hospital encounter of 11/03/16 (from the past 48 hour(s))  Comprehensive metabolic  panel     Status: Abnormal   Collection Time: 11/03/16  3:44 PM  Result Value Ref Range   Sodium 141 135 - 145 mmol/L   Potassium 4.0 3.5 -  5.1 mmol/L   Chloride 100 (L) 101 - 111 mmol/L   CO2 22 22 - 32 mmol/L   Glucose, Bld 110 (H) 65 - 99 mg/dL   BUN 22 (H) 6 - 20 mg/dL   Creatinine, Ser 4.83 (H) 0.44 - 1.00 mg/dL   Calcium 9.2 8.9 - 10.3 mg/dL   Total Protein 7.1 6.5 - 8.1 g/dL   Albumin 3.3 (L) 3.5 - 5.0 g/dL   AST 25 15 - 41 U/L   ALT 10 (L) 14 - 54 U/L   Alkaline Phosphatase 97 38 - 126 U/L   Total Bilirubin 1.5 (H) 0.3 - 1.2 mg/dL   GFR calc non Af Amer 8 (L) >60 mL/min   GFR calc Af Amer 10 (L) >60 mL/min    Comment: (NOTE) The eGFR has been calculated using the CKD EPI equation. This calculation has not been validated in all clinical situations. eGFR's persistently <60 mL/min signify possible Chronic Kidney Disease.    Anion gap 19 (H) 5 - 15  CBC with Differential     Status: Abnormal   Collection Time: 11/03/16  3:44 PM  Result Value Ref Range   WBC 14.6 (H) 4.0 - 10.5 K/uL   RBC 4.57 3.87 - 5.11 MIL/uL   Hemoglobin 13.1 12.0 - 15.0 g/dL   HCT 41.0 36.0 - 46.0 %   MCV 89.7 78.0 - 100.0 fL   MCH 28.7 26.0 - 34.0 pg   MCHC 32.0 30.0 - 36.0 g/dL   RDW 17.2 (H) 11.5 - 15.5 %   Platelets 142 (L) 150 - 400 K/uL   Neutrophils Relative % 88 %   Neutro Abs 12.9 (H) 1.7 - 7.7 K/uL   Lymphocytes Relative 3 %   Lymphs Abs 0.5 (L) 0.7 - 4.0 K/uL   Monocytes Relative 8 %   Monocytes Absolute 1.2 (H) 0.1 - 1.0 K/uL   Eosinophils Relative 0 %   Eosinophils Absolute 0.0 0.0 - 0.7 K/uL   Basophils Relative 0 %   Basophils Absolute 0.0 0.0 - 0.1 K/uL  I-stat troponin, ED     Status: Abnormal   Collection Time: 11/03/16  3:48 PM  Result Value Ref Range   Troponin i, poc 0.39 (HH) 0.00 - 0.08 ng/mL   Comment NOTIFIED PHYSICIAN    Comment 3            Comment: Due to the release kinetics of cTnI, a negative result within the first hours of the onset of symptoms does not  rule out myocardial infarction with certainty. If myocardial infarction is still suspected, repeat the test at appropriate intervals.   I-stat Chem 8, ED     Status: Abnormal   Collection Time: 11/03/16  3:50 PM  Result Value Ref Range   Sodium 140 135 - 145 mmol/L   Potassium 3.9 3.5 - 5.1 mmol/L   Chloride 102 101 - 111 mmol/L   BUN 25 (H) 6 - 20 mg/dL   Creatinine, Ser 4.40 (H) 0.44 - 1.00 mg/dL   Glucose, Bld 116 (H) 65 - 99 mg/dL   Calcium, Ion 0.94 (L) 1.15 - 1.40 mmol/L   TCO2 26 22 - 32 mmol/L   Hemoglobin 15.0 12.0 - 15.0 g/dL   HCT 44.0 36.0 - 46.0 %  Urinalysis, Routine w reflex microscopic     Status: Abnormal   Collection Time: 11/03/16  4:41 PM  Result Value Ref Range   Color, Urine YELLOW YELLOW   APPearance TURBID (A) CLEAR   Specific  Gravity, Urine 1.012 1.005 - 1.030   pH 7.0 5.0 - 8.0   Glucose, UA NEGATIVE NEGATIVE mg/dL   Hgb urine dipstick LARGE (A) NEGATIVE   Bilirubin Urine NEGATIVE NEGATIVE   Ketones, ur NEGATIVE NEGATIVE mg/dL   Protein, ur 100 (A) NEGATIVE mg/dL   Nitrite NEGATIVE NEGATIVE   Leukocytes, UA LARGE (A) NEGATIVE   RBC / HPF 6-30 0 - 5 RBC/hpf   WBC, UA TOO NUMEROUS TO COUNT 0 - 5 WBC/hpf   Bacteria, UA MANY (A) NONE SEEN   Squamous Epithelial / LPF NONE SEEN NONE SEEN   WBC Clumps PRESENT   I-stat troponin, ED     Status: Abnormal   Collection Time: 11/03/16  5:32 PM  Result Value Ref Range   Troponin i, poc 0.71 (HH) 0.00 - 0.08 ng/mL   Comment NOTIFIED PHYSICIAN    Comment 3            Comment: Due to the release kinetics of cTnI, a negative result within the first hours of the onset of symptoms does not rule out myocardial infarction with certainty. If myocardial infarction is still suspected, repeat the test at appropriate intervals.    Dg Chest Portable 1 View  Result Date: 11/03/2016 CLINICAL DATA:  Syncope and weakness.  End-stage renal disease. EXAM: PORTABLE CHEST 1 VIEW COMPARISON:  08/17/2016 and 10/23/2014  FINDINGS: Lungs are adequately inflated without focal airspace consolidation or effusion. Minimal prominence of the right hilar region unchanged. Moderate stable cardiomegaly. Calcified plaque over the thoracic aorta. Remainder of the exam is unchanged. IMPRESSION: No acute cardiopulmonary disease. Moderate stable cardiomegaly. Aortic atherosclerosis. Electronically Signed   By: Marin Olp M.D.   On: 11/03/2016 13:44   Dg Abd 2 Views  Result Date: 11/03/2016 CLINICAL DATA:  Syncopal episode EXAM: ABDOMEN - 2 VIEW COMPARISON:  04/07/2010, CT 05/21/2011 FINDINGS: Cardiomegaly. No free air beneath the diaphragm. Extensive vascular calcifications in the upper abdomen. Nonobstructed bowel-gas pattern. Multiple surgical clips in the lower abdomen and pelvis. Postsurgical changes in the pelvis. IMPRESSION: Nonobstructed bowel-gas pattern.  Extensive vascular calcifications. Electronically Signed   By: Donavan Foil M.D.   On: 11/03/2016 18:38    PMH:   Past Medical History:  Diagnosis Date  . Acute respiratory failure with hypoxia ([place]) 10/23/2014  . Adenomatous duodenal polyp   . Anemia   . Arthritis    "hands" (11/17/2015)  . Atrial tachycardia ([place])    on amiod  . Cardiomyopathy- mixed   . CHF (congestive heart failure) ([place])   . Chronic diastolic heart failure ([place])   . Colon cancer ([place]) 1986  . Colostomy in place [facility])   . Coronary artery disease    Previously decreased EF; echo 113 normal LV function  . Dialysis patient ([place])   . ESRD (end stage renal disease) on dialysis [facility])    Dr Dunham/Dr. Lyda Kalata.  M, W, Fr; [place] (11/17/2015)  . GERD (gastroesophageal reflux disease)   . Glaucoma   . Heart murmur   . Hernia, incisional    abd  . Hyperlipidemia   . Hypertension   . Hypertensive heart disease    sees Dr. Alain Marion  . LBBB (left bundle branch block)   . Mitral valve insufficiency and aortic valve insufficiency   . Obesity   . Stroke [facility])    2011/12  . Type II  diabetes mellitus (HCC)     PSH:   Past Surgical History:  Procedure Laterality Date  . ARTERIOVENOUS GRAFT PLACEMENT  Left 2010   "forearm"  . BALLOON DILATION N/A 08/17/2016   Procedure: POSSIBLE BALLOON DILATION POSSIBLE MALONEY;  Surgeon: Gatha Mayer, MD;  Location: [facility] END[facility]COPY;  Service: Endoscopy;  Laterality: N/A;  . CARDIAC CATHETERIZATION    . CARDIAC CATHETERIZATION N/A 10/27/2014   Procedure: Left Heart Cath and Coronary Angiography;  Surgeon: Leonie Man, MD;  Location: [place] CV LAB;  Service: Cardiovascular;  Laterality: N/A;  . CARDIAC CATHETERIZATION N/A 10/28/2014   Procedure: Coronary Stent Intervention;  Surgeon: Peter M Martinique, MD;  Location: [place] CV LAB;  Service: Cardiovascular;  Laterality: N/A;  . CARDIAC CATHETERIZATION Bilateral 12/2008   R. heart cath showed elevated left and right heart filling pressures w/ pulmonary artery pressure elevated mildly out of proportion to the wedge. The left heart cath showed diffuse distal vessel disease as well as a 75% stenosis in the mid circumflex w/ a 90% stenosis of the ostial first obtuse marginal. These lesions were in close proximity. there was a 60-70%mild RCA stenosis.   [name] CATARACT EXTRACTION W/ INTRAOCULAR LENS  IMPLANT, BILATERAL Bilateral   . COLON SURGERY    . COLONOSCOPY    . COLOSTOMY  1986  . ELECTROCARDIOGRAM  04-27-06  . ELECTROPHYSIOLOGIC STUDY N/A 11/17/2015   Procedure: A-Tach Ablation;  Surgeon: Will Meredith Leeds, MD;  Location: [place] CV LAB;  Service: Cardiovascular;  Laterality: N/A;  . ESOPHAGOGASTRODUODENOSCOPY  03-18-04  . ESOPHAGOGASTRODUODENOSCOPY N/A 02/02/2013   Procedure: ESOPHAGOGASTRODUODENOSCOPY (EGD);  Surgeon: Irene Shipper, MD;  Location: [facility] ENDOSCOPY;  Service: Endoscopy;  Laterality: N/A;  . ESOPHAGOGASTRODUODENOSCOPY (EGD) WITH PROPOFOL N/A 08/17/2016   Procedure: ESOPHAGOGASTRODUODENOSCOPY (EGD) WITH PROPOFOL;  Surgeon: Gatha Mayer, MD;  Location: [facility];   Service: Endoscopy;  Laterality: N/A;   NO XRAY NEEDED  . EYE SURGERY    . FOOT AMPUTATION THROUGH METATARSAL  10-07-10   Right foot transmetatarsal  . INSERTION OF AHMED VALVE Right 08/20/2013   Procedure: INSERTION OF AHMED VALVE WITH Mitomycin C application;  Surgeon: Marylynn Pearson, MD;  Location: [place];  Service: Ophthalmology;  Laterality: Right;  . INSERTION OF AHMED VALVE Left 07/22/2014   Procedure: INSERTION OF AHMED VALVE WITH [place];  Surgeon: Marylynn Pearson, MD;  Location: [place];  Service: Ophthalmology;  Laterality: Left;  . LEFT HEART CATH AND CORONARY ANGIOGRAPHY N/A 08/18/2016   Procedure: LEFT HEART CATH AND CORONARY ANGIOGRAPHY;  Surgeon: Martinique, Peter M, MD;  Location: [place] CV LAB;  Service: Cardiovascular;  Laterality: N/A;  . MITOMYCIN C APPLICATION Left 0/26/3785   Procedure: MITOMYCIN C APPLICATION;  Surgeon: Marylynn Pearson, MD;  Location: [place];  Service: Ophthalmology;  Laterality: Left;  . PARS PLANA VITRECTOMY  11/29/2011   Procedure: PARS PLANA VITRECTOMY WITH 23 GAUGE;  Surgeon: Adonis Brook, MD;  Location: [place];  Service: Ophthalmology;  Laterality: Right;  Right Eye 23 ga vitrectomy with membrane peel  . PARS PLANA VITRECTOMY Left 02/28/2012   Procedure: PARS PLANA VITRECTOMY WITH 23 GAUGE;  Surgeon: Adonis Brook, MD;  Location: [place];  Service: Ophthalmology;  Laterality: Left;  . REVISION UROSTOMY CUTANEOUS    . SUPRAVENTRICULAR TACHYCARDIA ABLATION  11/17/2015  . VAGINAL HYSTERECTOMY      Allergies:  Allergies  Allergen Reactions  . Ace Inhibitors Cough  . Eggs Or Egg-Derived Products Nausea And Vomiting  . Enalapril Cough  . Lisinopril Cough  . Omnipaque [Iohexol] Hives    Medications:   Prior to Admission medications   Medication Sig Start  Date End Date Taking? Authorizing Provider  acetaminophen (TYLENOL) 500 MG tablet Take 500-1,000 mg by mouth every 6 (six) hours as needed (pain).    Yes [provider]  aspirin 81 MG tablet  Take 1 tablet (81 mg total) by mouth daily. 10/29/14  Yes Dhungel, Nishant, MD  calcium acetate (PHOSLO) 667 MG capsule Take 1,334-2,001 mg by mouth See admin instructions. Take 2067m three times daily with meals and Take 13342m with snacks.   Yes [provider]  dorzolamide-timolol (COSOPT) 22.3-6.8 MG/ML ophthalmic solution Place 1 drop into the left eye 2 (two) times daily. 11/09/15  Yes [provider]  glucose blood (ONE TOUCH ULTRA TEST) test strip 1 each by Other route daily. And lancets 1/day 03/16/16  Yes ElRenato ShinMD  lidocaine-prilocaine (EMLA) cream Apply 1 application topically as needed (topical anesthesia for hemodialysis if Gebauers and Lidocaine injection are ineffective.). 08/20/16  Yes WoAllie BossierMD  metoprolol tartrate (LOPRESSOR) 50 MG tablet Take 1 tablet (50 mg total) by mouth every Tuesday, Thursday, Saturday, and Sunday. 08/20/16  Yes WoAllie BossierMD  repaglinide (PRANDIN) 0.5 MG tablet TAKE 1 TABLET BY MOUTH THREE TIMES DAILY BEFORE MEALS 03/16/16  Yes ElRenato ShinMD  simvastatin (ZOCOR) 10 MG tablet Take 1 tablet (10 mg total) by mouth every evening. 05/23/16  Yes Camnitz, Will MaHassell DoneMD  Fluticasone-Salmeterol (ADVAIR) 250-50 MCG/DOSE AEPB Inhale 1 puff into the lungs daily as needed (shortness of breath).  05/28/12   Plotnikov, AlEvie LacksMD  nitroGLYCERIN (NITROSTAT) 0.4 MG SL tablet Place 1 tablet (0.4 mg total) under the tongue every 5 (five) minutes as needed for chest pain. 10/29/14   Dhungel, NiFlonnie OvermanMD  pantoprazole (PROTONIX) 40 MG tablet Take 1 tablet (40 mg total) by mouth daily before breakfast. Patient not taking: Reported on 11/03/2016 07/25/16   GeGatha MayerMD  sucralfate (CARAFATE) 1 GM/10ML suspension Take 10 mLs (1 g total) by mouth 4 (four) times daily -  with meals and at bedtime. Patient not taking: Reported on 11/03/2016 08/20/16   WoAllie BossierMD    Discontinued Meds:   Medications Discontinued During This  Encounter  Medication Reason  . repaglinide (PRANDIN) tablet 0.5 mg   . acetaminophen (TYLENOL) tablet 500-1,000 mg   . calcium acetate (PHOSLO) capsule 1,334-2,001 mg   . pantoprazole (PROTONIX) EC tablet 40 mg Patient has not taken in last 30 days    Social History:  reports that she has never smoked. She has never used smokeless tobacco. She reports that she does not drink alcohol or use drugs.  Family History:   Family History  Problem Relation Age of Onset  . Hypertension Mother   . Coronary artery disease Mother   . Hypertension Father   . Diabetes Father   . Cancer Sister        colon  . Hypertension Other     Blood pressure (!) 99/54, pulse 100, temperature 98.8 F (37.1 C), temperature source Oral, resp. rate 18, height 5' (1.524 m), weight 73.5 kg (162 lb 0.6 oz), SpO2 100 %. General appearance: alert, cooperative and appears stated age Head: Normocephalic, without obvious abnormality, atraumatic Eyes: negative Neck: no adenopathy, no carotid bruit, supple, symmetrical, trachea midline and thyroid not enlarged, symmetric, no tenderness/mass/nodules Back: symmetric, no curvature. ROM normal. No CVA tenderness. Resp: clear to auscultation bilaterally Chest wall: no tenderness Cardio: regular rate and rhythm, S1, S2 normal, no murmur, click, rub or gallop GI: soft, non-tender;  bowel sounds normal; no masses,  no organomegaly Extremities: edema tr Pulses: 2+ and symmetric Skin: Skin color, texture, turgor normal. No rashes or lesions Lymph nodes: Cervical, supraclavicular, and axillary nodes normal. Neurologic: Grossly normal  Rt TMA        Marlei Glomski, Hunt Oris, MD 11/03/2016, 8:30 PM

## 2016-11-03 NOTE — ED Notes (Signed)
Called lab to collect blood.

## 2016-11-04 ENCOUNTER — Other Ambulatory Visit: Payer: Self-pay

## 2016-11-04 ENCOUNTER — Encounter (HOSPITAL_COMMUNITY): Payer: Self-pay

## 2016-11-04 DIAGNOSIS — I5042 Chronic combined systolic (congestive) and diastolic (congestive) heart failure: Secondary | ICD-10-CM

## 2016-11-04 DIAGNOSIS — D72829 Elevated white blood cell count, unspecified: Secondary | ICD-10-CM | POA: Diagnosis present

## 2016-11-04 DIAGNOSIS — D631 Anemia in chronic kidney disease: Secondary | ICD-10-CM | POA: Diagnosis not present

## 2016-11-04 DIAGNOSIS — R748 Abnormal levels of other serum enzymes: Secondary | ICD-10-CM | POA: Diagnosis not present

## 2016-11-04 DIAGNOSIS — N2581 Secondary hyperparathyroidism of renal origin: Secondary | ICD-10-CM | POA: Diagnosis not present

## 2016-11-04 DIAGNOSIS — Z9842 Cataract extraction status, left eye: Secondary | ICD-10-CM | POA: Diagnosis not present

## 2016-11-04 DIAGNOSIS — Z936 Other artificial openings of urinary tract status: Secondary | ICD-10-CM | POA: Diagnosis not present

## 2016-11-04 DIAGNOSIS — I214 Non-ST elevation (NSTEMI) myocardial infarction: Secondary | ICD-10-CM

## 2016-11-04 DIAGNOSIS — N184 Chronic kidney disease, stage 4 (severe): Secondary | ICD-10-CM | POA: Diagnosis not present

## 2016-11-04 DIAGNOSIS — Z8673 Personal history of transient ischemic attack (TIA), and cerebral infarction without residual deficits: Secondary | ICD-10-CM | POA: Diagnosis not present

## 2016-11-04 DIAGNOSIS — I429 Cardiomyopathy, unspecified: Secondary | ICD-10-CM | POA: Diagnosis not present

## 2016-11-04 DIAGNOSIS — Z961 Presence of intraocular lens: Secondary | ICD-10-CM | POA: Diagnosis present

## 2016-11-04 DIAGNOSIS — R55 Syncope and collapse: Secondary | ICD-10-CM | POA: Diagnosis not present

## 2016-11-04 DIAGNOSIS — Z6831 Body mass index (BMI) 31.0-31.9, adult: Secondary | ICD-10-CM | POA: Diagnosis not present

## 2016-11-04 DIAGNOSIS — R7881 Bacteremia: Secondary | ICD-10-CM

## 2016-11-04 DIAGNOSIS — I2510 Atherosclerotic heart disease of native coronary artery without angina pectoris: Secondary | ICD-10-CM | POA: Diagnosis not present

## 2016-11-04 DIAGNOSIS — Z9841 Cataract extraction status, right eye: Secondary | ICD-10-CM | POA: Diagnosis not present

## 2016-11-04 DIAGNOSIS — Z992 Dependence on renal dialysis: Secondary | ICD-10-CM

## 2016-11-04 DIAGNOSIS — N903 Dysplasia of vulva, unspecified: Secondary | ICD-10-CM | POA: Diagnosis present

## 2016-11-04 DIAGNOSIS — E1122 Type 2 diabetes mellitus with diabetic chronic kidney disease: Secondary | ICD-10-CM | POA: Diagnosis not present

## 2016-11-04 DIAGNOSIS — Z90710 Acquired absence of both cervix and uterus: Secondary | ICD-10-CM | POA: Diagnosis not present

## 2016-11-04 DIAGNOSIS — I959 Hypotension, unspecified: Secondary | ICD-10-CM | POA: Diagnosis present

## 2016-11-04 DIAGNOSIS — I132 Hypertensive heart and chronic kidney disease with heart failure and with stage 5 chronic kidney disease, or end stage renal disease: Secondary | ICD-10-CM | POA: Diagnosis not present

## 2016-11-04 DIAGNOSIS — N186 End stage renal disease: Secondary | ICD-10-CM

## 2016-11-04 DIAGNOSIS — I248 Other forms of acute ischemic heart disease: Secondary | ICD-10-CM | POA: Diagnosis not present

## 2016-11-04 DIAGNOSIS — E11649 Type 2 diabetes mellitus with hypoglycemia without coma: Secondary | ICD-10-CM | POA: Diagnosis not present

## 2016-11-04 DIAGNOSIS — B955 Unspecified streptococcus as the cause of diseases classified elsewhere: Secondary | ICD-10-CM | POA: Diagnosis not present

## 2016-11-04 DIAGNOSIS — D649 Anemia, unspecified: Secondary | ICD-10-CM | POA: Diagnosis present

## 2016-11-04 DIAGNOSIS — R1110 Vomiting, unspecified: Secondary | ICD-10-CM | POA: Diagnosis not present

## 2016-11-04 DIAGNOSIS — Z933 Colostomy status: Secondary | ICD-10-CM | POA: Diagnosis not present

## 2016-11-04 DIAGNOSIS — Z85038 Personal history of other malignant neoplasm of large intestine: Secondary | ICD-10-CM | POA: Diagnosis not present

## 2016-11-04 LAB — CBC
HCT: 36 % (ref 36.0–46.0)
Hemoglobin: 11.2 g/dL — ABNORMAL LOW (ref 12.0–15.0)
MCH: 27.7 pg (ref 26.0–34.0)
MCHC: 31.1 g/dL (ref 30.0–36.0)
MCV: 89.1 fL (ref 78.0–100.0)
PLATELETS: 132 10*3/uL — AB (ref 150–400)
RBC: 4.04 MIL/uL (ref 3.87–5.11)
RDW: 17 % — ABNORMAL HIGH (ref 11.5–15.5)
WBC: 15.2 10*3/uL — ABNORMAL HIGH (ref 4.0–10.5)

## 2016-11-04 LAB — TROPONIN I
TROPONIN I: 2.12 ng/mL — AB (ref <0.03)
Troponin I: 2.28 ng/mL (ref <0.03)

## 2016-11-04 LAB — GLUCOSE, CAPILLARY
GLUCOSE-CAPILLARY: 110 mg/dL — AB (ref 65–99)
GLUCOSE-CAPILLARY: 173 mg/dL — AB (ref 65–99)

## 2016-11-04 LAB — BASIC METABOLIC PANEL
Anion gap: 16 — ABNORMAL HIGH (ref 5–15)
BUN: 25 mg/dL — ABNORMAL HIGH (ref 6–20)
CHLORIDE: 98 mmol/L — AB (ref 101–111)
CO2: 24 mmol/L (ref 22–32)
CREATININE: 5.17 mg/dL — AB (ref 0.44–1.00)
Calcium: 8.8 mg/dL — ABNORMAL LOW (ref 8.9–10.3)
GFR calc non Af Amer: 8 mL/min — ABNORMAL LOW (ref >60)
GFR, EST AFRICAN AMERICAN: 9 mL/min — AB (ref >60)
Glucose, Bld: 146 mg/dL — ABNORMAL HIGH (ref 65–99)
Potassium: 3.7 mmol/L (ref 3.5–5.1)
Sodium: 138 mmol/L (ref 135–145)

## 2016-11-04 LAB — MRSA PCR SCREENING: MRSA BY PCR: POSITIVE — AB

## 2016-11-04 LAB — HEPATITIS B SURFACE ANTIGEN: HEP B S AG: NEGATIVE

## 2016-11-04 LAB — HEPARIN LEVEL (UNFRACTIONATED): HEPARIN UNFRACTIONATED: 0.2 [IU]/mL — AB (ref 0.30–0.70)

## 2016-11-04 MED ORDER — HEPARIN (PORCINE) IN NACL 100-0.45 UNIT/ML-% IJ SOLN
1250.0000 [IU]/h | INTRAMUSCULAR | Status: DC
  Administered 2016-11-04: 750 [IU]/h via INTRAVENOUS
  Administered 2016-11-05: 1100 [IU]/h via INTRAVENOUS
  Filled 2016-11-04 (×2): qty 250

## 2016-11-04 MED ORDER — HEPARIN BOLUS VIA INFUSION
4000.0000 [IU] | Freq: Once | INTRAVENOUS | Status: AC
  Administered 2016-11-04: 4000 [IU] via INTRAVENOUS
  Filled 2016-11-04: qty 4000

## 2016-11-04 MED ORDER — SODIUM CHLORIDE 0.9 % IV SOLN: 100.0000 mL | INTRAVENOUS | Status: DC | PRN

## 2016-11-04 MED ORDER — HEPARIN SODIUM (PORCINE) 1000 UNIT/ML DIALYSIS: 1000.0000 [IU] | INTRAMUSCULAR | Status: DC | PRN

## 2016-11-04 MED ORDER — LIDOCAINE HCL (PF) 1 % IJ SOLN: 5.0000 mL | INTRAMUSCULAR | Status: DC | PRN

## 2016-11-04 MED ORDER — BOOST / RESOURCE BREEZE PO LIQD
1.0000 | Freq: Two times a day (BID) | ORAL | Status: DC
  Administered 2016-11-05 – 2016-11-06 (×2): 1 via ORAL

## 2016-11-04 MED ORDER — HEPARIN SODIUM (PORCINE) 1000 UNIT/ML DIALYSIS: 2000.0000 [IU] | Freq: Once | INTRAMUSCULAR | Status: DC

## 2016-11-04 MED ORDER — SIMVASTATIN 10 MG PO TABS
10.0000 mg | ORAL_TABLET | Freq: Every evening | ORAL | Status: DC
Start: 2016-11-04 — End: 2016-11-06
  Administered 2016-11-04 – 2016-11-06 (×3): 10 mg via ORAL
  Filled 2016-11-04 (×4): qty 1

## 2016-11-04 MED ORDER — LIDOCAINE-PRILOCAINE 2.5-2.5 % EX CREA: 1.0000 "application " | TOPICAL_CREAM | CUTANEOUS | Status: DC | PRN

## 2016-11-04 MED ORDER — PENTAFLUOROPROP-TETRAFLUOROETH EX AERO: 1.0000 "application " | INHALATION_SPRAY | CUTANEOUS | Status: DC | PRN

## 2016-11-04 MED ORDER — VANCOMYCIN HCL 1000 MG IV SOLR
1500.0000 mg | INTRAVENOUS | Status: AC
  Administered 2016-11-04: 1500 mg via INTRAVENOUS
  Filled 2016-11-04: qty 1500

## 2016-11-04 NOTE — Progress Notes (Signed)
Patient ID: [name], female   DOB: [birth year], 71 y.o.   MRN: [id number]  PROGRESS NOTE    TRINH SANJOSE  MWU:[id number] DOB: July 16, 1945 DOA: 11/03/2016 PCP: Cassandria Anger, MD   Brief Narrative:  71 year old female with history of end-stage renal disease on dialysis, hypertension, diabetes mellitus, left bundle branch block, paroxysmal atrial tachycardia status post ablation, CHF and CAD presented with syncopal episode.   Assessment & Plan:   Principal Problem:   Syncope Active Problems:   Hypertensive heart disease   Anemia   ESRD on dialysis (HCC)   S/P transmetatarsal amputation of foot (HCC)   Elevated troponin   Congestive heart failure (CHF) (HCC)   Syncope: Probably vasovagal -Currently feels much better without chest pain - Monitor. - echocardiogram from 10/31/2016 without significant aortic stenosis or other outflow obstruction,  EF was 25-30% and without segmental/Regional wall motion abnormalities.    Positive troponins - Currently no chest pain. Probably from demand ischemia in a patient with history of known coronary disease. Cardiology consulted. Cycle troponins  Gram-positive cocci bacteremia - Follow identification. Repeat culture for tomorrow. Start vancomycin empirically for now.   End-stage renal disease on hemodialysis - Nephrology following. Dialysis as per nephrology recommendations  Multivessel coronary artery disease with last cardiac catheterization on 08/18/2016  - Continue aspirin, statin and metoprolol. Her chest pain-free.    Chronic combined systolic and diastolic heart failure with last EF of 25-30% based on echo from 10/31/2016 - Volume managed by dialysis  Leukocytosis - No evidence of sepsis but in light of the bacteremia, will treat with vancomycin. Repeat cultures in a.m.  DM - Continue Accu-Cheks with coverage  H/o Atrial Tachycardia-status post previous ablation by Dr Curt Bears on 11/17/15 -  currently off  amiodarone, continue metoprolol    DVT prophylaxis: SCDs Code Status:  Full Family Communication: None at bedside Disposition Plan: Home in 1-2 days  Consultants: Nephrology and cardiology  Procedures: None  Antimicrobials: None    Subjective: Patient seen and examined at bedside. She denies any current chest pain, shortness of breath. She does not remember what happened yesterday when she had the syncopal episode. No overnight fever or vomiting  Objective: Vitals:   11/03/16 1900 11/03/16 1901 11/04/16 0100 11/04/16 0651  BP:  (!) 99/54 (!) 99/59 (!) 109/48  Pulse:  100 89 84  Resp:  18 18 16   Temp:  98.8 F (37.1 C) 98.6 F (37 C) 98 F (36.7 C)  TempSrc:  Oral Oral Oral  SpO2:  100% 99% 96%  Weight: 73.5 kg (162 lb 0.6 oz)   73.8 kg (162 lb 9.6 oz)  Height: 5' (1.524 m)       Intake/Output Summary (Last 24 hours) at 11/04/16 1238 Last data filed at 11/04/16 0900  Gross per 24 hour  Intake              480 ml  Output              100 ml  Net              380 ml   Filed Weights   11/03/16 1302 11/03/16 1900 11/04/16 0651  Weight: 74.8 kg (165 lb) 73.5 kg (162 lb 0.6 oz) 73.8 kg (162 lb 9.6 oz)    Examination:  General exam: Appears calm and comfortable  Respiratory system: Bilateral decreased breath sound at bases Cardiovascular system: S1 & S2 heard, rate controlled  Gastrointestinal system: Abdomen is nondistended, soft and nontender.  Normal bowel sounds heard. Colostomy and urostomy backs present Extremities: No cyanosis, clubbing; trace edema   Data Reviewed: I have personally reviewed following labs and imaging studies  CBC:  Recent Labs Lab 11/03/16 1544 11/03/16 1550 11/04/16 0218  WBC 14.6*  --  15.2*  NEUTROABS 12.9*  --   --   HGB 13.1 15.0 11.2*  HCT 41.0 44.0 36.0  MCV 89.7  --  89.1  PLT 142*  --  176*   Basic Metabolic Panel:  Recent Labs Lab 11/03/16 1544 11/03/16 1550 11/04/16 0218  NA 141 140 138  K 4.0 3.9 3.7  CL  100* 102 98*  CO2 22  --  24  GLUCOSE 110* 116* 146*  BUN 22* 25* 25*  CREATININE 4.83* 4.40* 5.17*  CALCIUM 9.2  --  8.8*   GFR: Estimated Creatinine Clearance: 9.1 mL/min (A) (by C-G formula based on SCr of 5.17 mg/dL (H)). Liver Function Tests:  Recent Labs Lab 11/03/16 1544  AST 25  ALT 10*  ALKPHOS 97  BILITOT 1.5*  PROT 7.1  ALBUMIN 3.3*   No results for input(s): LIPASE, AMYLASE in the last 168 hours. No results for input(s): AMMONIA in the last 168 hours. Coagulation Profile: No results for input(s): INR, PROTIME in the last 168 hours. Cardiac Enzymes:  Recent Labs Lab 11/04/16 0218 11/04/16 0614  TROPONINI 2.12* 2.28*   BNP (last 3 results) No results for input(s): PROBNP in the last 8760 hours. HbA1C: No results for input(s): HGBA1C in the last 72 hours. CBG:  Recent Labs Lab 11/03/16 2054 11/04/16 0744  GLUCAP 125* 110*   Lipid Profile: No results for input(s): CHOL, HDL, LDLCALC, TRIG, CHOLHDL, LDLDIRECT in the last 72 hours. Thyroid Function Tests: No results for input(s): TSH, T4TOTAL, FREET4, T3FREE, THYROIDAB in the last 72 hours. Anemia Panel: No results for input(s): VITAMINB12, FOLATE, FERRITIN, TIBC, IRON, RETICCTPCT in the last 72 hours. Sepsis Labs: No results for input(s): PROCALCITON, LATICACIDVEN in the last 168 hours.  Recent Results (from the past 240 hour(s))  Culture, blood (Routine X 2) w Reflex to ID Panel     Status: None (Preliminary result)   Collection Time: 11/03/16  5:15 PM  Result Value Ref Range Status   Specimen Description BLOOD RIGHT HAND  Final   Special Requests IN PEDIATRIC BOTTLE Blood Culture adequate volume  Final   Culture NO GROWTH < 12 HOURS  Final   Report Status PENDING  Incomplete  Culture, blood (Routine X 2) w Reflex to ID Panel     Status: None (Preliminary result)   Collection Time: 11/03/16  5:32 PM  Result Value Ref Range Status   Specimen Description BLOOD RIGHT WRIST  Final   Special  Requests IN PEDIATRIC BOTTLE Blood Culture adequate volume  Final   Culture  Setup Time   Final    GRAM POSITIVE COCCI IN CHAINS IN PEDIATRIC BOTTLE Organism ID to follow CRITICAL RESULT CALLED TO, READ BACK BY AND VERIFIED WITH: N. GAZDA PHARMD, AT 1056 11/04/16 BY D. VANHOOK    Culture GRAM POSITIVE COCCI  Final   Report Status PENDING  Incomplete  MRSA PCR Screening     Status: Abnormal   Collection Time: 11/04/16  2:31 AM  Result Value Ref Range Status   MRSA by PCR POSITIVE (A) NEGATIVE Final    Comment:        The GeneXpert MRSA Assay (FDA approved for NASAL specimens only), is one component of a comprehensive MRSA colonization surveillance  program. It is not intended to diagnose MRSA infection nor to guide or monitor treatment for MRSA infections. RESULT CALLED TO, READ BACK BY AND VERIFIED WITH: K BRADLEY,RN AT 0750 11/04/16 BY L BENFIELD          Radiology Studies: Dg Chest Portable 1 View  Result Date: 11/03/2016 CLINICAL DATA:  Syncope and weakness.  End-stage renal disease. EXAM: PORTABLE CHEST 1 VIEW COMPARISON:  08/17/2016 and 10/23/2014 FINDINGS: Lungs are adequately inflated without focal airspace consolidation or effusion. Minimal prominence of the right hilar region unchanged. Moderate stable cardiomegaly. Calcified plaque over the thoracic aorta. Remainder of the exam is unchanged. IMPRESSION: No acute cardiopulmonary disease. Moderate stable cardiomegaly. Aortic atherosclerosis. Electronically Signed   By: Marin Olp M.D.   On: 11/03/2016 13:44   Dg Abd 2 Views  Result Date: 11/03/2016 CLINICAL DATA:  Syncopal episode EXAM: ABDOMEN - 2 VIEW COMPARISON:  04/07/2010, CT 05/21/2011 FINDINGS: Cardiomegaly. No free air beneath the diaphragm. Extensive vascular calcifications in the upper abdomen. Nonobstructed bowel-gas pattern. Multiple surgical clips in the lower abdomen and pelvis. Postsurgical changes in the pelvis. IMPRESSION: Nonobstructed bowel-gas  pattern.  Extensive vascular calcifications. Electronically Signed   By: Donavan Foil M.D.   On: 11/03/2016 18:38        Scheduled Meds: . aspirin EC  81 mg Oral Daily  . calcium acetate  2,001 mg Oral TID WC  . dorzolamide-timolol  1 drop Left Eye BID  . feeding supplement (NEPRO CARB STEADY)  237 mL Oral BID BM  . heparin  5,000 Units Subcutaneous Q12H  . insulin aspart  1 Units Subcutaneous TID WC  . metoprolol tartrate  50 mg Oral Q T,Th,S,Su  . senna  1 tablet Oral BID  . simvastatin  10 mg Oral QPM  . sodium chloride flush  3 mL Intravenous Q12H  . sucralfate  1 g Oral TID WC & HS   Continuous Infusions: . sodium chloride    . heparin 750 Units/hr (11/04/16 1149)  . vancomycin       LOS: 0 days        Aline August, MD [facility] Pager [phone]  If 7PM-7AM, please contact night-coverage [email or web address] Password [facility] 11/04/2016, 12:38 PM

## 2016-11-04 NOTE — Progress Notes (Signed)
Patient moans, but says nothing is wrong. Denies any pain or nausea and refuses to take certain medications.

## 2016-11-04 NOTE — Consult Note (Signed)
Cardiology Consultation:   Patient ID: [name]; [id number]; November 14, 1945   Admit date: 11/03/2016 Date of Consult: 11/04/2016  Primary Care Provider: Cassandria Anger, MD Primary Cardiologist/ Electrophysiologist:  Dr. Caryl Comes    Patient Profile:   [name] is a 71 y.o. female with a hx of  ESRD on HD, CAD s/p PCI, medically managed RCA and LAD disease, chronic systolic and diastolic heart failure LVEF 25-30%, chronic LBBB atrial tachycardia s/p ablation 11/2015 by Dr. Curt Bears and PEA arrest who is being seen today for the evaluation of syncope and elevated troponin at the request of Dr. Starla Link.   Admitted 08/2016 with PEA arrest when the probe was being removed for EGD. There was no loss of an electrical rhythm, but she was pulseless and required 1 minute of CPR.  There were no ventricular arrhythmias. LHC during admission without significant change in her anatomy and no PCI was undertaken.  She became apneic with minimal sedation during her cath that resolved with stimulation and meds were reversed. It was suspected that Hypotension with sedation and apnea with propofol  is what caused PEA event during endoscopy procedure.   Repeat echocardiogram 10/31/16 showed stable left ventricular function of 25-30%, elevated LVEDP, moderate to severe mitral stenosis, moderate mitral regurgitation, moderately reduced systolic function of right ventricle, moderately dilated right atrium, moderate tricuspid regurgitation and pulmonary pressure of 65 mmHg.  History of Present Illness:   Ms. [name] presented with syncope episode 11/03/16.  The patient was checking in for hemodialysis and suddenly started vomiting and passed out.  He continued to have nausea and emesis without chest pain, shortness of breath, dizziness or palpitation.  Per Note, noted hypoglycemic in ER.  Patient has a chronic dyspnea on exertion without chest pain.  POC troponin 0.39-->0.71. Troponin I of 2.12-->2.28. Scr  of 5.17.  Chest x-ray without acute finding.  Abdominal x-ray so nonobstructed bowel gas.  Extensive vascular calcification.  EKG showed junctional tachycardia rate of 119 bpm -personally reviewed.  Telemetry shows sinus rhythm -  personally reviewed.  Past Medical History:  Diagnosis Date  . Acute respiratory failure with hypoxia ([place]) 10/23/2014  . Adenomatous duodenal polyp   . Anemia   . Arthritis    "hands" (11/17/2015)  . Atrial tachycardia ([place])    on amiod  . Cardiomyopathy- mixed   . CHF (congestive heart failure) ([place])   . Chronic diastolic heart failure ([place])   . Colon cancer ([place]) 1986  . Colostomy in place [facility])   . Coronary artery disease    Previously decreased EF; echo 113 normal LV function  . Dialysis patient ([place])   . ESRD (end stage renal disease) on dialysis [facility])    Dr Dunham/Dr. Lyda Kalata.  M, W, Fr; [place] (11/17/2015)  . GERD (gastroesophageal reflux disease)   . Glaucoma   . Heart murmur   . Hernia, incisional    abd  . Hyperlipidemia   . Hypertension   . Hypertensive heart disease    sees Dr. Alain Marion  . LBBB (left bundle branch block)   . Mitral valve insufficiency and aortic valve insufficiency   . Obesity   . Stroke [facility])    2011/12  . Type II diabetes mellitus ([place])     Past Surgical History:  Procedure Laterality Date  . ARTERIOVENOUS GRAFT PLACEMENT Left 2010   "forearm"  . BALLOON DILATION N/A 08/17/2016   Procedure: POSSIBLE BALLOON DILATION POSSIBLE MALONEY;  Surgeon: Gatha Mayer, MD;  Location: [facility]  END[facility];  Service: Endoscopy;  Laterality: N/A;  . CARDIAC CATHETERIZATION    . CARDIAC CATHETERIZATION N/A 10/27/2014   Procedure: Left Heart Cath and Coronary Angiography;  Surgeon: Leonie Man, MD;  Location: [place] CV LAB;  Service: Cardiovascular;  Laterality: N/A;  . CARDIAC CATHETERIZATION N/A 10/28/2014   Procedure: Coronary Stent Intervention;  Surgeon: Peter M Martinique, MD;  Location: [place] CV LAB;  Service:  Cardiovascular;  Laterality: N/A;  . CARDIAC CATHETERIZATION Bilateral 12/2008   R. heart cath showed elevated left and right heart filling pressures w/ pulmonary artery pressure elevated mildly out of proportion to the wedge. The left heart cath showed diffuse distal vessel disease as well as a 75% stenosis in the mid circumflex w/ a 90% stenosis of the ostial first obtuse marginal. These lesions were in close proximity. there was a 60-70%mild RCA stenosis.   [name] CATARACT EXTRACTION W/ INTRAOCULAR LENS  IMPLANT, BILATERAL Bilateral   . COLON SURGERY    . COLONOSCOPY    . COLOSTOMY  1986  . ELECTROCARDIOGRAM  04-27-06  . ELECTROPHYSIOLOGIC STUDY N/A 11/17/2015   Procedure: A-Tach Ablation;  Surgeon: Keneth Borg Meredith Leeds, MD;  Location: [place] CV LAB;  Service: Cardiovascular;  Laterality: N/A;  . ESOPHAGOGASTRODUODENOSCOPY  03-18-04  . ESOPHAGOGASTRODUODENOSCOPY N/A 02/02/2013   Procedure: ESOPHAGOGASTRODUODENOSCOPY (EGD);  Surgeon: Irene Shipper, MD;  Location[facility];  Service: Endoscopy;  Laterality: N/A;  . ESOPHAGOGASTRODUODENOSCOPY (EGD) WITH PROPOFOL N/A 08/17/2016   Procedure: ESOPHAGOGASTRODUODENOSCOPY (EGD) WITH PROPOFOL;  Surgeon: Gatha Mayer, MD;  Location: [facility] ENDOSCOPY;  Service: Endoscopy;  Laterality: N/A;   NO XRAY NEEDED  . EYE SURGERY    . FOOT AMPUTATION THROUGH METATARSAL  10-07-10   Right foot transmetatarsal  . INSERTION OF AHMED VALVE Right 08/20/2013   Procedure: INSERTION OF AHMED VALVE WITH Mitomycin C application;  Surgeon: Marylynn Pearson, MD;  Location: [place];  Service: Ophthalmology;  Laterality: Right;  . INSERTION OF AHMED VALVE Left 07/22/2014   Procedure: INSERTION OF AHMED VALVE WITH [place];  Surgeon: Marylynn Pearson, MD;  Location: [place];  Service: Ophthalmology;  Laterality: Left;  . LEFT HEART CATH AND CORONARY ANGIOGRAPHY N/A 08/18/2016   Procedure: LEFT HEART CATH AND CORONARY ANGIOGRAPHY;  Surgeon: Martinique, Peter M, MD;  Location: [place] CV LAB;   Service: Cardiovascular;  Laterality: N/A;  . MITOMYCIN C APPLICATION Left 5/85/2778   Procedure: MITOMYCIN C APPLICATION;  Surgeon: Marylynn Pearson, MD;  Location: [place];  Service: Ophthalmology;  Laterality: Left;  . PARS PLANA VITRECTOMY  11/29/2011   Procedure: PARS PLANA VITRECTOMY WITH 23 GAUGE;  Surgeon: Adonis Brook, MD;  Location: [place];  Service: Ophthalmology;  Laterality: Right;  Right Eye 23 ga vitrectomy with membrane peel  . PARS PLANA VITRECTOMY Left 02/28/2012   Procedure: PARS PLANA VITRECTOMY WITH 23 GAUGE;  Surgeon: Adonis Brook, MD;  Location: [place];  Service: Ophthalmology;  Laterality: Left;  . REVISION UROSTOMY CUTANEOUS    . SUPRAVENTRICULAR TACHYCARDIA ABLATION  11/17/2015  . VAGINAL HYSTERECTOMY       Inpatient Medications: Scheduled Meds: . aspirin EC  81 mg Oral Daily  . calcium acetate  2,001 mg Oral TID WC  . dorzolamide-timolol  1 drop Left Eye BID  . feeding supplement (NEPRO CARB STEADY)  237 mL Oral BID BM  . heparin  5,000 Units Subcutaneous Q12H  . insulin aspart  1 Units Subcutaneous TID WC  . metoprolol tartrate  50 mg Oral Q T,Th,S,Su  .  senna  1 tablet Oral BID  . simvastatin  10 mg Oral QPM  . sodium chloride flush  3 mL Intravenous Q12H  . sucralfate  1 g Oral TID WC & HS   Continuous Infusions: . sodium chloride     PRN Meds: sodium chloride, acetaminophen **OR** acetaminophen, albuterol, calcium acetate, lidocaine-prilocaine, nitroGLYCERIN, ondansetron **OR** ondansetron (ZOFRAN) IV, polyethylene glycol, sodium chloride flush, traZODone  Allergies:    Allergies  Allergen Reactions  . Ace Inhibitors Cough  . Eggs Or Egg-Derived Products Nausea And Vomiting  . Enalapril Cough  . Lisinopril Cough  . Omnipaque [Iohexol] Hives    Social History:   Social History   Social History  . Marital status: Widowed    Spouse name: N/A  . Number of children: N/A  . Years of education: N/A   Occupational History  . Not on file.   Social  History Main Topics  . Smoking status: Never Smoker  . Smokeless tobacco: Never Used  . Alcohol use No  . Drug use: No  . Sexual activity: No   Other Topics Concern  . Not on file   Social History Narrative  . No narrative on file    Family History:    Family History  Problem Relation Age of Onset  . Hypertension Mother   . Coronary artery disease Mother   . Hypertension Father   . Diabetes Father   . Cancer Sister        colon  . Hypertension Other      ROS:  Please see the history of present illness.  ROS All other ROS reviewed and negative.     Physical Exam/Data:   Vitals:   11/03/16 1900 11/03/16 1901 11/04/16 0100 11/04/16 0651  BP:  (!) 99/54 (!) 99/59 (!) 109/48  Pulse:  100 89 84  Resp:  18 18 16   Temp:  98.8 F (37.1 C) 98.6 F (37 C) 98 F (36.7 C)  TempSrc:  Oral Oral Oral  SpO2:  100% 99% 96%  Weight: 162 lb 0.6 oz (73.5 kg)   162 lb 9.6 oz (73.8 kg)  Height: 5' (1.524 m)       Intake/Output Summary (Last 24 hours) at 11/04/16 1024 Last data filed at 11/04/16 0900  Gross per 24 hour  Intake              480 ml  Output              100 ml  Net              380 ml   Filed Weights   11/03/16 1302 11/03/16 1900 11/04/16 0651  Weight: 165 lb (74.8 kg) 162 lb 0.6 oz (73.5 kg) 162 lb 9.6 oz (73.8 kg)   Body mass index is 31.76 kg/m.  General:  Well nourished, well developed, in no acute distress HEENT: normal Lymph: no adenopathy Neck: no JVD Endocrine:  No thryomegaly Vascular: No carotid bruits; FA pulses 2+ bilaterally without bruits  Cardiac:  normal S1, S2; RRR; + murmur Lungs:  clear to auscultation bilaterally, no wheezing, rhonchi or rales  Abd: soft, urostomy and colostomy bag SFK:CLEXN to 1 + LE edema R> L.  prior transmetatarsal amputation, left arm AV fistula with positive thrill and bruit Musculoskeletal:  No deformities, BUE and BLE strength normal and equal Skin: warm and dry  Neuro:  CNs 2-12 intact, no focal abnormalities  noted Psych:  Normal affect   Relevant CV Studies:  Echo 10/31/16 Study Conclusions  - Left ventricle: The cavity size was mildly dilated. Systolic   function was severely reduced. The estimated ejection fraction   was in the range of 25% to 30%. Wall motion was normal; there   were no regional wall motion abnormalities. Doppler parameters   are consistent with restrictive physiology, indicative of   decreased left ventricular diastolic compliance and/or increased   left atrial pressure. Doppler parameters are consistent with   elevated ventricular end-diastolic filling pressure. - Ventricular septum: Septal motion showed paradox. - Aortic valve: There was mild stenosis. There was moderate   regurgitation. - Mitral valve: Calcified annulus. Severely thickened, severely   calcified leaflets . Mobility was restricted. The findings are   consistent with moderate to severe stenosis. There was moderate   regurgitation. Mean gradient (D): 10 mm Hg. - Left atrium: The atrium was severely dilated. - Right ventricle: The cavity size was mildly dilated. Wall   thickness was normal. Systolic function was moderately reduced. - Right atrium: The atrium was moderately dilated. - Tricuspid valve: There was moderate regurgitation. - Pulmonary arteries: Systolic pressure was moderately to severely   increased. PA peak pressure: 65 mm Hg (S). - Inferior vena cava: The vessel was dilated. The respirophasic   diameter changes were blunted (< 50%), consistent with elevated   central venous pressure.  Cath 08/18/16 LEFT HEART CATH AND CORONARY ANGIOGRAPHY  Conclusion     Dist LAD lesion, 100 %stenosed.  1st Diag lesion, 0 %stenosed.  Ost 2nd Diag to 2nd Diag lesion, 70 %stenosed.  Ost 1st Mrg to 1st Mrg lesion, 80 %stenosed.  Mid RCA lesion, 99 %stenosed.  Ost RPDA to RPDA lesion, 100 %stenosed.  LV end diastolic pressure is normal.   1. 3 vessel obstructive CAD    - CTO of the  distal LAD with left to left collaterals    - 80% ostial OM1    - 99% CTO of the mid RCA 2. Normal LVEDP 16 mm Hg  Plan: there is no significant change on angiography compared to 2016. Of note during the procedure with minimal sedation the patient had apnea with associated significant hypotension that resolved with stimulation. She was given Romazicon to reverse Versed and Narcan to reverse Fentanyl.  I suspect Hypotension with sedation and apnea with propofol  is what caused PEA event during endoscopy procedure.     Laboratory Data:  Chemistry Recent Labs Lab 11/03/16 1544 11/03/16 1550 11/04/16 0218  NA 141 140 138  K 4.0 3.9 3.7  CL 100* 102 98*  CO2 22  --  24  GLUCOSE 110* 116* 146*  BUN 22* 25* 25*  CREATININE 4.83* 4.40* 5.17*  CALCIUM 9.2  --  8.8*  GFRNONAA 8*  --  8*  GFRAA 10*  --  9*  ANIONGAP 19*  --  16*     Recent Labs Lab 11/03/16 1544  PROT 7.1  ALBUMIN 3.3*  AST 25  ALT 10*  ALKPHOS 97  BILITOT 1.5*   Hematology Recent Labs Lab 11/03/16 1544 11/03/16 1550 11/04/16 0218  WBC 14.6*  --  15.2*  RBC 4.57  --  4.04  HGB 13.1 15.0 11.2*  HCT 41.0 44.0 36.0  MCV 89.7  --  89.1  MCH 28.7  --  27.7  MCHC 32.0  --  31.1  RDW 17.2*  --  17.0*  PLT 142*  --  132*   Cardiac Enzymes Recent Labs Lab 11/04/16 0218 11/04/16 0614  TROPONINI  2.12* 2.28*    Recent Labs Lab 11/03/16 1548 11/03/16 1732  TROPIPOC 0.39* 0.71*    BNPNo results for input(s): BNP, PROBNP in the last 168 hours.  DDimer No results for input(s): DDIMER in the last 168 hours.  Radiology/Studies:  Dg Chest Portable 1 View  Result Date: 11/03/2016 CLINICAL DATA:  Syncope and weakness.  End-stage renal disease. EXAM: PORTABLE CHEST 1 VIEW COMPARISON:  08/17/2016 and 10/23/2014 FINDINGS: Lungs are adequately inflated without focal airspace consolidation or effusion. Minimal prominence of the right hilar region unchanged. Moderate stable cardiomegaly. Calcified plaque over  the thoracic aorta. Remainder of the exam is unchanged. IMPRESSION: No acute cardiopulmonary disease. Moderate stable cardiomegaly. Aortic atherosclerosis. Electronically Signed   By: Marin Olp M.D.   On: 11/03/2016 13:44   Dg Abd 2 Views  Result Date: 11/03/2016 CLINICAL DATA:  Syncopal episode EXAM: ABDOMEN - 2 VIEW COMPARISON:  04/07/2010, CT 05/21/2011 FINDINGS: Cardiomegaly. No free air beneath the diaphragm. Extensive vascular calcifications in the upper abdomen. Nonobstructed bowel-gas pattern. Multiple surgical clips in the lower abdomen and pelvis. Postsurgical changes in the pelvis. IMPRESSION: Nonobstructed bowel-gas pattern.  Extensive vascular calcifications. Electronically Signed   By: Donavan Foil M.D.   On: 11/03/2016 18:38    Assessment and Plan:   1. NSTEMI with hx of CAD - Troponin trending up. She denies any chest pain. Last cath 08/18/16 showed stable anatomy as described above. Given extensive disease, she is likely not a candidate for revascularization. Continue ASA, statin and BB. May consider adding plavix.   2. Syncope - ? Vasovagal vs hypoglycemia. No prodrome.  -She became apneic with minimal sedation during her cath that resolved with stimulation and meds were reversed. It was suspected that Hypotension with sedation and apnea with propofol  is what caused PEA event during endoscopy procedure.   3.  Atrial tachycardia S/p ablation w/Dr. Curt Bears 11/17/15 - Recently discontinued amiodarone. EKG with tachycardia again. Mallary Kreger review with MD.  4. Chronic combined CHF - Volume managed by dialysis. Recent echo showed stable LVEF of 25-30%.  5. Valvular heart disease - AS noted on echo.  Moderate to severe mitral stenosis, moderate mitral regurgitation, moderately reduced systolic function of right ventricle, moderately dilated right atrium, moderate tricuspid regurgitation and pulmonary pressure of 65 mmHg.   For questions or updates, please contact [place] Please consult [email or web address] for contact info under Cardiology/STEMI.   Jarrett Soho, PA  11/04/2016 10:24 AM   I have seen and examined this patient with Vin Bhagat.  Agree with above, note added to reflect my findings.  On exam, RRR, no murmurs, lungs clear. Resented to the hospital after an episode of syncope while at dialysis. On lab check, was found to have a troponin of above 2. As she has not had any episodes of chest pain, would continue with current management and follow her troponins. If her troponins do continue to rise, would start heparin at that time. This could possibly be due to hypotension around her time of syncope.she may benefit from Plavix as a second antiplatelet.  Syncere Eble M. Sammy Cassar MD 11/04/2016 11:16 AM

## 2016-11-04 NOTE — Progress Notes (Signed)
Initial Nutrition Assessment  DOCUMENTATION CODES:   Obesity unspecified  INTERVENTION:    Boost Breeze po BID, each supplement provides 250 kcal and 9 grams of protein  NUTRITION DIAGNOSIS:   Increased nutrient needs related to chronic illness as evidenced by estimated needs.  GOAL:   Patient will meet greater than or equal to 90% of their needs  MONITOR:   PO intake, Supplement acceptance, Labs, I & O's  REASON FOR ASSESSMENT:   Malnutrition Screening Tool    ASSESSMENT:   71 yo female with PMH of ESRD on HD, HLD, cardiomyopathy, CHF, CAD, anemia, HTN, stroke, GERD, colostomy, colon cancer, DM-2 who was admitted on 10/26 from HD center after a syncopal episode with N/V and elevated troponin/NSTEMI.  Patient leaving for HD when RD entered her room. Unable to obtain much nutrition information from patient, except that she does not like Nepro supplements.   Unsure of dry weight, but Renal note suggests that patient was below her EDW on admission. Per weight encounters in EMR, patient has lost 13 lbs in the past 2.5 months, 7.5% weight loss within 3 months is significant. Suspect weight loss is mostly related to fluid status.   Labs reviewed. Troponin level is trending upward. CBG's: 125-110 MRSA + Medications reviewed and include Phoslo.   NUTRITION - FOCUSED PHYSICAL EXAM:  Unable to complete Nutrition-Focused physical exam at this time.   Diet Order:  Diet renal/carb modified with fluid restriction Diet-HS Snack? Nothing; Room service appropriate? Yes; Fluid consistency: Thin  EDUCATION NEEDS:   Not appropriate for education at this time  Skin:  Skin Assessment: Reviewed RN Assessment  Last BM:  10/27 colostomy  Height:   Ht Readings from Last 1 Encounters:  11/03/16 5' (1.524 m)    Weight:   Wt Readings from Last 1 Encounters:  11/04/16 164 lb 10.9 oz (74.7 kg)    Ideal Body Weight:  45.5 kg  BMI:  Body mass index is 32.16 kg/m.  Estimated  Nutritional Needs:   Kcal:  1700-1900  Protein:  70-85 gm  Fluid:  1.2 L    Molli Barrows, RD, LDN, CNSC Pager [phone] After Hours Pager [phone]

## 2016-11-04 NOTE — Progress Notes (Signed)
[facility] Progress Note    MWF @ [facility] w/ Dr. Joelyn Oms last HD on Wed, EDW is 74.5kg, T4hr 3K 2Ca Heparin 3000 bolus, Calcitriol 63mcg PO  TIW, last PTH 312, not on Mircera with last Hb >12.  Assessment/ Plan:   Assessment/Plan: 1. ESRD  - Plan on HD first thing this AM. - Will monitor closely with gentle UF to determine if she develops any near syncopal symptoms as [facility] was not able to HD her today bec of her symptoms but hypotension. No signs of infection. Appears to have a left BBF vs left bovine graft.  - Will have nurse weigh on floor scale prior to HD as current weight is below her EDW; if she is really below her EDW then will turn off UF.  2. CASHD/CHF LHC 08/18/16 - 3V obstructive disease with CTO of distal LAD and severe OM1 and mid RCA lesions. Currently denies any CP or dyspnea. 3. Vomiting today but currently no nausea; reports only bilious type emesis. 4. Leukocytosis with no overt signs of infection and access certainly has no signs of obvious infection. 5. DM - was hypoglycemic at [facility].  Subjective:   No further N/V overnight. Pt moans but denies any discomfort.   Objective:   BP (!) 99/59 (BP Location: Right Arm)   Pulse 89   Temp 98.6 F (37 C) (Oral)   Resp 18   Ht 5' (1.524 m)   Wt 73.5 kg (162 lb 0.6 oz)   SpO2 99%   BMI 31.65 kg/m   Intake/Output Summary (Last 24 hours) at 11/04/16 0520 Last data filed at 11/03/16 2100  Gross per 24 hour  Intake              240 ml  Output                0 ml  Net              240 ml   Weight change:   Physical Exam: GEN: NAD Back: symmetric, no curvature. ROM normal. No CVA tenderness. Resp: clear to auscultation bilaterally Cardio: regular rate and rhythm, S1, S2 normal, no murmur, click, rub or gallop GI: soft, non-tender; bowel sounds normal; no masses,  no organomegaly Extremities: edema tr, Rt TMA Pulses: 2+ and symmetric HD Access: Left BBF vs bovine - good bruit  Imaging: Dg Chest  Portable 1 View  Result Date: 11/03/2016 CLINICAL DATA:  Syncope and weakness.  End-stage renal disease. EXAM: PORTABLE CHEST 1 VIEW COMPARISON:  08/17/2016 and 10/23/2014 FINDINGS: Lungs are adequately inflated without focal airspace consolidation or effusion. Minimal prominence of the right hilar region unchanged. Moderate stable cardiomegaly. Calcified plaque over the thoracic aorta. Remainder of the exam is unchanged. IMPRESSION: No acute cardiopulmonary disease. Moderate stable cardiomegaly. Aortic atherosclerosis. Electronically Signed   By: Marin Olp M.D.   On: 11/03/2016 13:44   Dg Abd 2 Views  Result Date: 11/03/2016 CLINICAL DATA:  Syncopal episode EXAM: ABDOMEN - 2 VIEW COMPARISON:  04/07/2010, CT 05/21/2011 FINDINGS: Cardiomegaly. No free air beneath the diaphragm. Extensive vascular calcifications in the upper abdomen. Nonobstructed bowel-gas pattern. Multiple surgical clips in the lower abdomen and pelvis. Postsurgical changes in the pelvis. IMPRESSION: Nonobstructed bowel-gas pattern.  Extensive vascular calcifications. Electronically Signed   By: Donavan Foil M.D.   On: 11/03/2016 18:38    Labs: BMET  Recent Labs Lab 11/03/16 1544 11/03/16 1550 11/04/16 0218  NA 141 140 138  K 4.0 3.9 3.7  CL 100* 102 98*  CO2 22  --  24  GLUCOSE 110* 116* 146*  BUN 22* 25* 25*  CREATININE 4.83* 4.40* 5.17*  CALCIUM 9.2  --  8.8*   CBC  Recent Labs Lab 11/03/16 1544 11/03/16 1550 11/04/16 0218  WBC 14.6*  --  15.2*  NEUTROABS 12.9*  --   --   HGB 13.1 15.0 11.2*  HCT 41.0 44.0 36.0  MCV 89.7  --  89.1  PLT 142*  --  132*    Medications:    . aspirin EC  81 mg Oral Daily  . calcium acetate  2,001 mg Oral TID WC  . dorzolamide-timolol  1 drop Left Eye BID  . feeding supplement (NEPRO CARB STEADY)  237 mL Oral BID BM  . heparin  5,000 Units Subcutaneous Q12H  . insulin aspart  1 Units Subcutaneous TID WC  . metoprolol tartrate  50 mg Oral Q T,Th,S,Su  . senna  1  tablet Oral BID  . simvastatin  10 mg Oral QPM  . sodium chloride flush  3 mL Intravenous Q12H  . sucralfate  1 g Oral TID WC & HS      Otelia Santee, MD 11/04/2016, 5:20 AM

## 2016-11-04 NOTE — Progress Notes (Signed)
Pt refusing medications  Pt educated and still refused

## 2016-11-04 NOTE — Progress Notes (Signed)
Critical troponin 2.12, NP aware, EKG done. Patient denies any pain.

## 2016-11-04 NOTE — Progress Notes (Addendum)
ANTICOAGULATION CONSULT NOTE - Initial Consult  Pharmacy Consult for heparin Indication: chest pain/ACS  Allergies  Allergen Reactions  . Ace Inhibitors Cough  . Eggs Or Egg-Derived Products Nausea And Vomiting  . Enalapril Cough  . Lisinopril Cough  . Omnipaque [Iohexol] Hives   Patient Measurements: Height: 5' (152.4 cm) Weight: 163 lb 9.3 oz (74.2 kg) IBW/kg (Calculated) : 45.5 Heparin Dosing Weight: 62 Kg  Vital Signs: Temp: 97.4 F (36.3 C) (10/27 1650) Temp Source: Oral (10/27 1650) BP: 103/58 (10/27 1650) Pulse Rate: 92 (10/27 1650)  Labs:  Recent Labs  11/03/16 1544 11/03/16 1550 11/04/16 0218 11/04/16 0614 11/04/16 1923  HGB 13.1 15.0 11.2*  --   --   HCT 41.0 44.0 36.0  --   --   PLT 142*  --  132*  --   --   HEPARINUNFRC  --   --   --   --  0.20*  CREATININE 4.83* 4.40* 5.17*  --   --   TROPONINI  --   --  2.12* 2.28*  --    Estimated Creatinine Clearance: 9.1 mL/min (A) (by C-G formula based on SCr of 5.17 mg/dL (H)).  Medical History: Past Medical History:  Diagnosis Date  . Acute respiratory failure with hypoxia ([place]) 10/23/2014  . Adenomatous duodenal polyp   . Anemia   . Arthritis    "hands" (11/17/2015)  . Atrial tachycardia ([place])    on amiod  . Cardiomyopathy- mixed   . CHF (congestive heart failure) ([place])   . Chronic diastolic heart failure ([place])   . Colon cancer ([place]) 1986  . Colostomy in place [facility])   . Coronary artery disease    Previously decreased EF; echo 113 normal LV function  . Dialysis patient ([place])   . ESRD (end stage renal disease) on dialysis [facility])    Dr Dunham/Dr. Lyda Kalata.  M, W, Fr; [place] (11/17/2015)  . GERD (gastroesophageal reflux disease)   . Glaucoma   . Heart murmur   . Hernia, incisional    abd  . Hyperlipidemia   . Hypertension   . Hypertensive heart disease    sees Dr. Alain Marion  . LBBB (left bundle branch block)   . Mitral valve insufficiency and aortic valve insufficiency   . Obesity   . Stroke  [facility])    2011/12  . Type II diabetes mellitus (HCC)    Medications:  Prescriptions Prior to Admission  Medication Sig Dispense Refill Last Dose  . acetaminophen (TYLENOL) 500 MG tablet Take 500-1,000 mg by mouth every 6 (six) hours as needed (pain).    11/03/2016 at Unknown time  . aspirin 81 MG tablet Take 1 tablet (81 mg total) by mouth daily. 30 tablet 3 11/03/2016 at Unknown time  . calcium acetate (PHOSLO) 667 MG capsule Take 1,334-2,001 mg by mouth See admin instructions. Take 2001mg  three times daily with meals and Take 1334mg   with snacks.   11/02/2016 at Unknown time  . dorzolamide-timolol (COSOPT) 22.3-6.8 MG/ML ophthalmic solution Place 1 drop into the left eye 2 (two) times daily.  12 Past Week at Unknown time  . glucose blood (ONE TOUCH ULTRA TEST) test strip 1 each by Other route daily. And lancets 1/day 100 each 3 11/02/2016 at Unknown time  . lidocaine-prilocaine (EMLA) cream Apply 1 application topically as needed (topical anesthesia for hemodialysis if Gebauers and Lidocaine injection are ineffective.). 30 g 0 11/02/2016 at Unknown time  . metoprolol tartrate (LOPRESSOR) 50 MG tablet Take 1 tablet (50  mg total) by mouth every Tuesday, Thursday, Saturday, and Sunday. 30 tablet 0 11/02/2016 at 4p  . repaglinide (PRANDIN) 0.5 MG tablet TAKE 1 TABLET BY MOUTH THREE TIMES DAILY BEFORE MEALS 90 tablet 3 11/02/2016 at Unknown time  . simvastatin (ZOCOR) 10 MG tablet Take 1 tablet (10 mg total) by mouth every evening. 90 tablet 3 11/02/2016 at Unknown time  . Fluticasone-Salmeterol (ADVAIR) 250-50 MCG/DOSE AEPB Inhale 1 puff into the lungs daily as needed (shortness of breath).    Not Taking at Unknown time  . nitroGLYCERIN (NITROSTAT) 0.4 MG SL tablet Place 1 tablet (0.4 mg total) under the tongue every 5 (five) minutes as needed for chest pain. 30 tablet 3 unk  . pantoprazole (PROTONIX) 40 MG tablet Take 1 tablet (40 mg total) by mouth daily before breakfast. (Patient not taking:  Reported on 11/03/2016) 30 tablet 11 Not Taking at Unknown time  . sucralfate (CARAFATE) 1 GM/10ML suspension Take 10 mLs (1 g total) by mouth 4 (four) times daily -  with meals and at bedtime. (Patient not taking: Reported on 11/03/2016) 420 mL 0 Not Taking at Unknown time   Assessment: [name] is a 71 y.o. female with ESRD presenting to the ED with chest pain and positive troponin. No anticoagulation documented prior to admit. Given extensive disease, cardiology does not think she is a candidate for revascularization. Will treat medically for 48 hours. HgB down slightly from admit 15>11.2. No bleeding documented.  Initial heparin level subtherapeutic: 0.20 - RN does not report any infusion issues, but patient was off the floor for dialysis prior to her shift.   Goal of Therapy:  Heparin level 0.3-0.7 units/ml Monitor platelets by anticoagulation protocol: Yes   Plan:  Increase heparin infusion to 900 units/hr Check anti-Xa level in 8 hours and daily while on heparin Continue to monitor H&H and platelets  Dyllon Henken L Destini Cambre 11/04/2016,8:23 PM

## 2016-11-04 NOTE — Progress Notes (Signed)
Colostomy bag emptied and rinsed per patient request.

## 2016-11-04 NOTE — Progress Notes (Addendum)
PHARMACY - PHYSICIAN COMMUNICATION CRITICAL VALUE ALERT - BLOOD CULTURE IDENTIFICATION (BCID)  No results found for this or any previous visit.  Name of physician (or Provider) Contacted: Dr. Starla Link  1/2 blood culture with gram pos cocci in chains. MD advised. Vanc w/ HD and repeat blood cx.   Myrene Galas 11/04/2016  11:30 AM

## 2016-11-04 NOTE — Progress Notes (Signed)
ANTICOAGULATION CONSULT NOTE - Initial Consult  Pharmacy Consult for heparin Indication: chest pain/ACS  Allergies  Allergen Reactions  . Ace Inhibitors Cough  . Eggs Or Egg-Derived Products Nausea And Vomiting  . Enalapril Cough  . Lisinopril Cough  . Omnipaque [Iohexol] Hives    Patient Measurements: Height: 5' (152.4 cm) Weight: 162 lb 9.6 oz (73.8 kg) (b scale) IBW/kg (Calculated) : 45.5 Heparin Dosing Weight: 62 Kg  Vital Signs: Temp: 98 F (36.7 C) (10/27 0651) Temp Source: Oral (10/27 0651) BP: 109/48 (10/27 0651) Pulse Rate: 84 (10/27 0651)  Labs:  Recent Labs  11/03/16 1544 11/03/16 1550 11/04/16 0218 11/04/16 0614  HGB 13.1 15.0 11.2*  --   HCT 41.0 44.0 36.0  --   PLT 142*  --  132*  --   CREATININE 4.83* 4.40* 5.17*  --   TROPONINI  --   --  2.12* 2.28*    Estimated Creatinine Clearance: 9.1 mL/min (A) (by C-G formula based on SCr of 5.17 mg/dL (H)).   Medical History: Past Medical History:  Diagnosis Date  . Acute respiratory failure with hypoxia ([place]) 10/23/2014  . Adenomatous duodenal polyp   . Anemia   . Arthritis    "hands" (11/17/2015)  . Atrial tachycardia ([place])    on amiod  . Cardiomyopathy- mixed   . CHF (congestive heart failure) ([place])   . Chronic diastolic heart failure ([place])   . Colon cancer ([place]) 1986  . Colostomy in place [facility])   . Coronary artery disease    Previously decreased EF; echo 113 normal LV function  . Dialysis patient ([place])   . ESRD (end stage renal disease) on dialysis [facility])    Dr Dunham/Dr. Lyda Kalata.  M, W, Fr; [place] (11/17/2015)  . GERD (gastroesophageal reflux disease)   . Glaucoma   . Heart murmur   . Hernia, incisional    abd  . Hyperlipidemia   . Hypertension   . Hypertensive heart disease    sees Dr. Alain Marion  . LBBB (left bundle branch block)   . Mitral valve insufficiency and aortic valve insufficiency   . Obesity   . Stroke [facility])    2011/12  . Type II diabetes mellitus (HCC)      Medications:  Prescriptions Prior to Admission  Medication Sig Dispense Refill Last Dose  . acetaminophen (TYLENOL) 500 MG tablet Take 500-1,000 mg by mouth every 6 (six) hours as needed (pain).    11/03/2016 at Unknown time  . aspirin 81 MG tablet Take 1 tablet (81 mg total) by mouth daily. 30 tablet 3 11/03/2016 at Unknown time  . calcium acetate (PHOSLO) 667 MG capsule Take 1,334-2,001 mg by mouth See admin instructions. Take 2001mg  three times daily with meals and Take 1334mg   with snacks.   11/02/2016 at Unknown time  . dorzolamide-timolol (COSOPT) 22.3-6.8 MG/ML ophthalmic solution Place 1 drop into the left eye 2 (two) times daily.  12 Past Week at Unknown time  . glucose blood (ONE TOUCH ULTRA TEST) test strip 1 each by Other route daily. And lancets 1/day 100 each 3 11/02/2016 at Unknown time  . lidocaine-prilocaine (EMLA) cream Apply 1 application topically as needed (topical anesthesia for hemodialysis if Gebauers and Lidocaine injection are ineffective.). 30 g 0 11/02/2016 at Unknown time  . metoprolol tartrate (LOPRESSOR) 50 MG tablet Take 1 tablet (50 mg total) by mouth every Tuesday, Thursday, Saturday, and Sunday. 30 tablet 0 11/02/2016 at 4p  . repaglinide (PRANDIN) 0.5 MG tablet TAKE 1 TABLET  BY MOUTH THREE TIMES DAILY BEFORE MEALS 90 tablet 3 11/02/2016 at Unknown time  . simvastatin (ZOCOR) 10 MG tablet Take 1 tablet (10 mg total) by mouth every evening. 90 tablet 3 11/02/2016 at Unknown time  . Fluticasone-Salmeterol (ADVAIR) 250-50 MCG/DOSE AEPB Inhale 1 puff into the lungs daily as needed (shortness of breath).    Not Taking at Unknown time  . nitroGLYCERIN (NITROSTAT) 0.4 MG SL tablet Place 1 tablet (0.4 mg total) under the tongue every 5 (five) minutes as needed for chest pain. 30 tablet 3 unk  . pantoprazole (PROTONIX) 40 MG tablet Take 1 tablet (40 mg total) by mouth daily before breakfast. (Patient not taking: Reported on 11/03/2016) 30 tablet 11 Not Taking at Unknown  time  . sucralfate (CARAFATE) 1 GM/10ML suspension Take 10 mLs (1 g total) by mouth 4 (four) times daily -  with meals and at bedtime. (Patient not taking: Reported on 11/03/2016) 420 mL 0 Not Taking at Unknown time    Assessment: [name] is a 71 y.o. female with ESRD presenting to the ED with chest pain and positive troponin. No anticoagulation documented prior to admit. Given extensive disease, cardiology does not think she is a candidate for revascularization. Will treat medically for 48 hours. HgB down slightly from admit 15>11.2. No bleeding documented.  Goal of Therapy:  Heparin level 0.3-0.7 units/ml Monitor platelets by anticoagulation protocol: Yes   Plan:  Give 4000 units bolus x 1 Start heparin infusion at 750 units/hr Check anti-Xa level in 8 hours and daily while on heparin Continue to monitor H&H and platelets  Handy Mcloud L Regenia Erck 11/04/2016,11:20 AM

## 2016-11-04 NOTE — Progress Notes (Signed)
Urostomy bag emptied- has a bowel smell. Colostomy bag emptied and rinsed (per patient request).

## 2016-11-05 DIAGNOSIS — N184 Chronic kidney disease, stage 4 (severe): Secondary | ICD-10-CM

## 2016-11-05 DIAGNOSIS — D631 Anemia in chronic kidney disease: Secondary | ICD-10-CM

## 2016-11-05 LAB — CBC
HCT: 36.6 % (ref 36.0–46.0)
Hemoglobin: 11.4 g/dL — ABNORMAL LOW (ref 12.0–15.0)
MCH: 27.5 pg (ref 26.0–34.0)
MCHC: 31.1 g/dL (ref 30.0–36.0)
MCV: 88.4 fL (ref 78.0–100.0)
PLATELETS: 153 10*3/uL (ref 150–400)
RBC: 4.14 MIL/uL (ref 3.87–5.11)
RDW: 17.4 % — ABNORMAL HIGH (ref 11.5–15.5)
WBC: 7.2 10*3/uL (ref 4.0–10.5)

## 2016-11-05 LAB — MAGNESIUM: MAGNESIUM: 1.8 mg/dL (ref 1.7–2.4)

## 2016-11-05 LAB — GLUCOSE, CAPILLARY
GLUCOSE-CAPILLARY: 206 mg/dL — AB (ref 65–99)
GLUCOSE-CAPILLARY: 157 mg/dL — AB (ref 65–99)
Glucose-Capillary: 107 mg/dL — ABNORMAL HIGH (ref 65–99)
Glucose-Capillary: 211 mg/dL — ABNORMAL HIGH (ref 65–99)

## 2016-11-05 LAB — BASIC METABOLIC PANEL
Anion gap: 14 (ref 5–15)
BUN: 16 mg/dL (ref 6–20)
CHLORIDE: 97 mmol/L — AB (ref 101–111)
CO2: 26 mmol/L (ref 22–32)
CREATININE: 3.51 mg/dL — AB (ref 0.44–1.00)
Calcium: 8.5 mg/dL — ABNORMAL LOW (ref 8.9–10.3)
GFR calc Af Amer: 14 mL/min — ABNORMAL LOW (ref >60)
GFR, EST NON AFRICAN AMERICAN: 12 mL/min — AB (ref >60)
Glucose, Bld: 107 mg/dL — ABNORMAL HIGH (ref 65–99)
Potassium: 3.5 mmol/L (ref 3.5–5.1)
SODIUM: 137 mmol/L (ref 135–145)

## 2016-11-05 LAB — HEPARIN LEVEL (UNFRACTIONATED)
Heparin Unfractionated: 0.16 IU/mL — ABNORMAL LOW (ref 0.30–0.70)
Heparin Unfractionated: >2.2 IU/mL — ABNORMAL HIGH (ref 0.30–0.70)
Heparin Unfractionated: >2.2 IU/mL — ABNORMAL HIGH (ref 0.30–0.70)
Heparin Unfractionated: 0.44 IU/mL (ref 0.30–0.70)

## 2016-11-05 LAB — TROPONIN I
TROPONIN I: 0.9 ng/mL — AB (ref <0.03)
Troponin I: 1.02 ng/mL (ref <0.03)

## 2016-11-05 MED ORDER — CALCIUM ACETATE (PHOS BINDER) 667 MG PO CAPS
2001.0000 mg | ORAL_CAPSULE | Freq: Three times a day (TID) | ORAL | Status: DC
  Administered 2016-11-06 (×2): 2001 mg via ORAL
  Filled 2016-11-05 (×3): qty 3

## 2016-11-05 NOTE — Evaluation (Signed)
Physical Therapy Evaluation Patient Details Name: [name] MRN: [id number] DOB: 18-Dec-1945 Today's Date: 11/05/2016   History of Present Illness  Pt adm with syncope likely vasovagal. PMH - ESRD on HD, rt transmet amputation, htn, chf, cad,   Clinical Impression  Pt admitted with above diagnosis and presents to PT with functional limitations due to deficits listed below (See PT problem list). Pt needs skilled PT to maximize independence and safety to allow discharge to home with daughter. Will follow acutely for PT but expect pt will be at or very close to baseline with mobility at DC. If not will reconsider [facility] services.     Follow Up Recommendations No PT follow up;Supervision for mobility/OOB (Pt feels she will be at or close to her baseline)    Equipment Recommendations  None recommended by PT    Recommendations for Other Services       Precautions / Restrictions Precautions Precautions: Fall Restrictions Weight Bearing Restrictions: No      Mobility  Bed Mobility Overal bed mobility: Needs Assistance Bed Mobility: Supine to Sit     Supine to sit: Min assist     General bed mobility comments: Assist for pt to pull up on my hand to elevate trunk into sitting  Transfers Overall transfer level: Needs assistance Equipment used: Rolling walker (2 wheeled) Transfers: Sit to/from Stand Sit to Stand: Min guard         General transfer comment: Assist for safety and line management  Ambulation/Gait Ambulation/Gait assistance: Min guard Ambulation Distance (Feet): 60 Feet Assistive device: Rolling walker (2 wheeled) Gait Pattern/deviations: Step-through pattern;Decreased step length - right;Decreased step length - left;Shuffle;Trunk flexed Gait velocity: decr Gait velocity interpretation: Below normal speed for age/gender General Gait Details: Assist for safety. Verbal cues to stand more erect. No overt loss of balance  Stairs            Wheelchair  Mobility    Modified Rankin (Stroke Patients Only)       Balance Overall balance assessment: Needs assistance Sitting-balance support: No upper extremity supported;Feet supported Sitting balance-Leahy Scale: Fair     Standing balance support: Bilateral upper extremity supported Standing balance-Leahy Scale: Poor Standing balance comment: walker for static standing                             Pertinent Vitals/Pain Pain Assessment: No/denies pain    Home Living Family/patient expects to be discharged to:: Private residence Living Arrangements: Children Available Help at Discharge: Family;Available PRN/intermittently;Personal care attendant (Daughter present most of time) Type of Home: House Home Access: Ramped entrance     Home Layout: One level Home Equipment: Environmental consultant - 2 wheels      Prior Function Level of Independence: Needs assistance   Gait / Transfers Assistance Needed: modified independent with rolling walker for household level  ADL's / Homemaking Assistance Needed: pt has aide that assist with bathing/dressing        Hand Dominance        Extremity/Trunk Assessment   Upper Extremity Assessment Upper Extremity Assessment: Generalized weakness    Lower Extremity Assessment Lower Extremity Assessment: Generalized weakness;RLE deficits/detail RLE Deficits / Details: rt transmetatarsal amputation       Communication   Communication: No difficulties  Cognition Arousal/Alertness: Awake/alert Behavior During Therapy: WFL for tasks assessed/performed Overall Cognitive Status: Within Functional Limits for tasks assessed  General Comments      Exercises     Assessment/Plan    PT Assessment Patient needs continued PT services  PT Problem List Decreased strength;Decreased activity tolerance;Decreased balance;Decreased mobility;Obesity       PT Treatment Interventions DME  instruction;Gait training;Functional mobility training;Therapeutic activities;Therapeutic exercise;Balance training;Patient/family education    PT Goals (Current goals can be found in the Care Plan section)  Acute Rehab PT Goals Patient Stated Goal: return home PT Goal Formulation: With patient Time For Goal Achievement: 11/12/16 Potential to Achieve Goals: Good    Frequency Min 3X/week   Barriers to discharge        Co-evaluation               AM-PAC PT "6 Clicks" Daily Activity  Outcome Measure Difficulty turning over in bed (including adjusting bedclothes, sheets and blankets)?: A Little Difficulty moving from lying on back to sitting on the side of the bed? : Unable Difficulty sitting down on and standing up from a chair with arms (e.g., wheelchair, bedside commode, etc,.)?: Unable Help needed moving to and from a bed to chair (including a wheelchair)?: A Little Help needed walking in hospital room?: A Little Help needed climbing 3-5 steps with a railing? : A Lot 6 Click Score: 13    End of Session Equipment Utilized During Treatment: Gait belt Activity Tolerance: Patient tolerated treatment well Patient left: in chair;with call bell/phone within reach;with chair alarm set Nurse Communication: Mobility status PT Visit Diagnosis: Unsteadiness on feet (R26.81);Muscle weakness (generalized) (M62.81)    Time: 4128-7867 PT Time Calculation (min) (ACUTE ONLY): 27 min   Charges:   PT Evaluation $PT Eval Moderate Complexity: 1 Mod PT Treatments $Gait Training: 8-22 mins   PT G Codes:        [facility] PT [place] 11/05/2016, 1:49 PM

## 2016-11-05 NOTE — Progress Notes (Signed)
Progress Note  Patient Name: [name] Date of Encounter: 11/05/2016  Primary Cardiologist/ Electrophysiologist:  Dr. Caryl Walls   Subjective   No complaint. Wants to go home. No chest pain or dyspnea.   Inpatient Medications    Scheduled Meds: . aspirin EC  81 mg Oral Daily  . calcium acetate  2,001 mg Oral TID WC  . dorzolamide-timolol  1 drop Left Eye BID  . feeding supplement  1 Container Oral BID BM  . insulin aspart  1 Units Subcutaneous TID WC  . metoprolol tartrate  50 mg Oral Q T,Th,S,Su  . senna  1 tablet Oral BID  . simvastatin  10 mg Oral QPM  . sodium chloride flush  3 mL Intravenous Q12H  . sucralfate  1 g Oral TID WC & HS   Continuous Infusions: . sodium chloride    . heparin 1,100 Units/hr (11/05/16 0635)   PRN Meds: sodium chloride, acetaminophen **OR** acetaminophen, albuterol, calcium acetate, lidocaine-prilocaine, nitroGLYCERIN, ondansetron **OR** ondansetron (ZOFRAN) IV, polyethylene glycol, sodium chloride flush, traZODone   Vital Signs    Vitals:   11/04/16 2157 11/05/16 0053 11/05/16 0100 11/05/16 0603  BP: (!) 103/46 (!) 109/53  (!) 109/49  Pulse: 79 91  87  Resp:   18 15  Temp: 98.1 F (36.7 C)  98.4 F (36.9 C) 98.6 F (37 C)  TempSrc: Oral  Oral Oral  SpO2: 99% 100%  98%  Weight:    162 lb (73.5 kg)  Height:        Intake/Output Summary (Last 24 hours) at 11/05/16 1053 Last data filed at 11/05/16 0606  Gross per 24 hour  Intake           510.41 ml  Output             -350 ml  Net           860.41 ml   Filed Weights   11/04/16 1245 11/04/16 1650 11/05/16 0603  Weight: 164 lb 10.9 oz (74.7 kg) 163 lb 9.3 oz (74.2 kg) 162 lb (73.5 kg)    Telemetry    SR seems prolonged 1st degree AV block and QT interval - Personally Reviewed  ECG    None today   Physical Exam   GEN: No acute distress.   Neck: No JVD Cardiac: RRR, no murmurs, rubs, or gallops.  Respiratory: Clear to auscultation bilaterally. GI: Soft, nontender,  non-distended  MS: No edema; No deformity. Neuro:  Nonfocal  Psych: Normal affect   Labs    Chemistry Recent Labs Lab 11/03/16 1544 11/03/16 1550 11/04/16 0218 11/05/16 0429  NA 141 140 138 137  K 4.0 3.9 3.7 3.5  CL 100* 102 98* 97*  CO2 22  --  24 26  GLUCOSE 110* 116* 146* 107*  BUN 22* 25* 25* 16  CREATININE 4.83* 4.40* 5.17* 3.51*  CALCIUM 9.2  --  8.8* 8.5*  PROT 7.1  --   --   --   ALBUMIN 3.3*  --   --   --   AST 25  --   --   --   ALT 10*  --   --   --   ALKPHOS 97  --   --   --   BILITOT 1.5*  --   --   --   GFRNONAA 8*  --  8* 12*  GFRAA 10*  --  9* 14*  ANIONGAP 19*  --  16* 14  Hematology Recent Labs Lab 11/03/16 1544 11/03/16 1550 11/04/16 0218 11/05/16 0429  WBC 14.6*  --  15.2* 7.2  RBC 4.57  --  4.04 4.14  HGB 13.1 15.0 11.2* 11.4*  HCT 41.0 44.0 36.0 36.6  MCV 89.7  --  89.1 88.4  MCH 28.7  --  27.7 27.5  MCHC 32.0  --  31.1 31.1  RDW 17.2*  --  17.0* 17.4*  PLT 142*  --  132* 153    Cardiac Enzymes Recent Labs Lab 11/04/16 0218 11/04/16 0614  TROPONINI 2.12* 2.28*    Recent Labs Lab 11/03/16 1548 11/03/16 1732  TROPIPOC 0.39* 0.71*      Radiology    Dg Chest Portable 1 View  Result Date: 11/03/2016 CLINICAL DATA:  Syncope and weakness.  End-stage renal disease. EXAM: PORTABLE CHEST 1 VIEW COMPARISON:  08/17/2016 and 10/23/2014 FINDINGS: Lungs are adequately inflated without focal airspace consolidation or effusion. Minimal prominence of the right hilar region unchanged. Moderate stable cardiomegaly. Calcified plaque over the thoracic aorta. Remainder of the exam is unchanged. IMPRESSION: No acute cardiopulmonary disease. Moderate stable cardiomegaly. Aortic atherosclerosis. Electronically Signed   By: [name] M.D.   On: 11/03/2016 13:44   Dg Abd 2 Views  Result Date: 11/03/2016 CLINICAL DATA:  Syncopal episode EXAM: ABDOMEN - 2 VIEW COMPARISON:  04/07/2010, CT 05/21/2011 FINDINGS: Cardiomegaly. No free air beneath  the diaphragm. Extensive vascular calcifications in the upper abdomen. Nonobstructed bowel-gas pattern. Multiple surgical clips in the lower abdomen and pelvis. Postsurgical changes in the pelvis. IMPRESSION: Nonobstructed bowel-gas pattern.  Extensive vascular calcifications. Electronically Signed   By: [name] M.D.   On: 11/03/2016 18:38    Cardiac Studies   None this admission  Patient Profile     [name] is a 71 y.o. female with a hx of ESRD on HD, CAD s/p PCI, medically managed RCA and LAD disease, chronic systolic and diastolic heart failure LVEF 25-30%, chronic LBBB atrial tachycardia s/p ablation 11/2015 by Dr. Curt Walls and PEA arrest. Cardiology is asked for syncope and elevated troponin at the request of Dr. Starla Walls.    She became apneic with minimal sedation during her cath that resolved with stimulation and meds were reversed. It was suspected that Hypotension with sedation and apnea with propofol is what caused PEA event during endoscopy procedure.  Assessment & Plan    1. NSTEMI with hx of CAD - Troponin trended up at peak of 2.28. She denies any chest pain. Last cath 08/18/16 showed stable anatomy as described above. Given extensive disease, she is likely not a candidate for revascularization.  - Continue heparin for now. [name] check troponin today, if trending down--> discontinue heparin and she [name] be ok for discharge on cardiac stand point.  - Continue ASA, statin and BB. Addition of plavix per [name].   2. Syncope - ? Vasovagal vs hypoglycemia. No prodrome. ?Bacteremia as 1 of 2 blood culture 10/26 growing GPC.  - Check orthostatic.    3.  Atrial tachycardia S/p ablation w/Dr. Curt Walls 11/17/15 - Recently discontinued amiodarone. EKG with tachycardia on admission. Rate stable on telemetry.   4. Chronic combined CHF - Volume managed by dialysis. Recent echo showed stable LVEF of 25-30%.  5. Valvular heart disease - AS noted on echo.  Moderate to severe  mitral stenosis, moderate mitral regurgitation, moderately reduced systolic function of right ventricle, moderately dilated right atrium, moderate tricuspid regurgitation and pulmonary pressure of 65 mmHg.   6. ? Bacteremia - Per  primary team  For questions or updates, please contact [place] Please consult [email or web address] for contact info under Cardiology/STEMI.      Signed, Leanor Kail, PA  11/05/2016, 10:53 AM    I have seen and examined this patient with Vin Bhagat.  Agree with above, note added to reflect my findings.  On exam, RRR, no murmurs, lungs clear.  Patient continued to not have chest pain.  Is currently feeling well.  On a heparin drip.  Has not had chest pain despite episodes of syncope and elevated troponin.  If repeat troponin is trending down, would plan to stop heparin.  No further cardiac workup needed if troponin is trending down.  Daemion Mcniel M. Lilian Fuhs [name] 11/05/2016 11:20 AM

## 2016-11-05 NOTE — Progress Notes (Signed)
[facility] Progress Note   Subjective:  Seen in room. No CP or dyspnea, no dizziness at this time.   Objective Vitals:   11/04/16 2157 11/05/16 0053 11/05/16 0100 11/05/16 0603  BP: (!) 103/46 (!) 109/53  (!) 109/49  Pulse: 79 91  87  Resp:   18 15  Temp: 98.1 F (36.7 C)  98.4 F (36.9 C) 98.6 F (37 C)  TempSrc: Oral  Oral Oral  SpO2: 99% 100%  98%  Weight:    73.5 kg (162 lb)  Height:       Physical Exam General: Well appearing, NAD. Heart: RRR; no murmur Lungs: CTAB Abdomen: soft, non-tender Extremities: No LE edema Dialysis Access: LUE AVF + bruit  Additional Objective Labs: Basic Metabolic Panel:  Recent Labs Lab 11/03/16 1544 11/03/16 1550 11/04/16 0218 11/05/16 0429  NA 141 140 138 137  K 4.0 3.9 3.7 3.5  CL 100* 102 98* 97*  CO2 22  --  24 26  GLUCOSE 110* 116* 146* 107*  BUN 22* 25* 25* 16  CREATININE 4.83* 4.40* 5.17* 3.51*  CALCIUM 9.2  --  8.8* 8.5*   Liver Function Tests:  Recent Labs Lab 11/03/16 1544  AST 25  ALT 10*  ALKPHOS 97  BILITOT 1.5*  PROT 7.1  ALBUMIN 3.3*   CBC:  Recent Labs Lab 11/03/16 1544 11/03/16 1550 11/04/16 0218 11/05/16 0429  WBC 14.6*  --  15.2* 7.2  NEUTROABS 12.9*  --   --   --   HGB 13.1 15.0 11.2* 11.4*  HCT 41.0 44.0 36.0 36.6  MCV 89.7  --  89.1 88.4  PLT 142*  --  132* 153   Blood Culture    Component Value Date/Time   SDES BLOOD RIGHT WRIST 11/03/2016 1732   SPECREQUEST IN PEDIATRIC BOTTLE Blood Culture adequate volume 11/03/2016 1732   CULT GRAM POSITIVE COCCI 11/03/2016 1732   REPTSTATUS PENDING 11/03/2016 1732    Cardiac Enzymes:  Recent Labs Lab 11/04/16 0218 11/04/16 0614  TROPONINI 2.12* 2.28*   CBG:  Recent Labs Lab 11/03/16 2054 11/04/16 0744 11/04/16 2155 11/05/16 0710  GLUCAP 125* 110* 173* 107*   Studies/Results: Dg Chest Portable 1 View  Result Date: 11/03/2016 CLINICAL DATA:  Syncope and weakness.  End-stage renal disease. EXAM: PORTABLE  CHEST 1 VIEW COMPARISON:  08/17/2016 and 10/23/2014 FINDINGS: Lungs are adequately inflated without focal airspace consolidation or effusion. Minimal prominence of the right hilar region unchanged. Moderate stable cardiomegaly. Calcified plaque over the thoracic aorta. Remainder of the exam is unchanged. IMPRESSION: No acute cardiopulmonary disease. Moderate stable cardiomegaly. Aortic atherosclerosis. Electronically Signed   By: Marin Olp M.D.   On: 11/03/2016 13:44   Dg Abd 2 Views  Result Date: 11/03/2016 CLINICAL DATA:  Syncopal episode EXAM: ABDOMEN - 2 VIEW COMPARISON:  04/07/2010, CT 05/21/2011 FINDINGS: Cardiomegaly. No free air beneath the diaphragm. Extensive vascular calcifications in the upper abdomen. Nonobstructed bowel-gas pattern. Multiple surgical clips in the lower abdomen and pelvis. Postsurgical changes in the pelvis. IMPRESSION: Nonobstructed bowel-gas pattern.  Extensive vascular calcifications. Electronically Signed   By: Donavan Foil M.D.   On: 11/03/2016 18:38   Medications: . sodium chloride    . heparin 1,100 Units/hr (11/05/16 5361)   . aspirin EC  81 mg Oral Daily  . calcium acetate  2,001 mg Oral TID WC  . dorzolamide-timolol  1 drop Left Eye BID  . feeding supplement  1 Container Oral BID BM  . insulin aspart  1  Units Subcutaneous TID WC  . metoprolol tartrate  50 mg Oral Q T,Th,S,Su  . senna  1 tablet Oral BID  . simvastatin  10 mg Oral QPM  . sodium chloride flush  3 mL Intravenous Q12H  . sucralfate  1 g Oral TID WC & HS    Dialysis Orders: MWF at [facility], 3K/2Ca bath, EDW 74.5kg, Heparin 3K bolus, AVF - Calcitriol 26mcg PO q HD - No ESA (last Hgb > 12)  Assessment/Plan: 1. Syncope: In setting of hypotension and hypoglycemia. ?Bacteremia, 1 of 2 BCx 10/26 growing GPC. Vanco 1st dose on 10/27.  2. NSTEMI/3v-CAD/ICM (EF 25-30%): Trop > 2, rising. On heparin drip. Cardiology following. 3. ESRD: Usually MWF schedule, missed last HD and  was dialyzed 10/27. Next 10/29. 4. HTN/volume: BP controlled, no volume excess on exam (under EDW). CXR clear. 5. Anemia: Hgb 11.4. No ESA yet. 6. Secondary hyperparathyroidism: Corr Ca ok, continue Phoslo as binder. Add VDRA.  7. Nutrition: Alb low, on protein supps. 8. Type 2 DM: On insulin, per primary. 9. Hx SVT: On metoprolol.   Veneta Penton, PA-C 11/05/2016, 8:45 AM  [employer] Pager: [phone]  Patient seen and examined. Agree with PA A&P Even with 1/2 bottles + for GPC pt did have a elevated white count and usually is not hypotensive at the outpt facility. Pt started on Vancomycin on 10/27. O/w pt doing well although she complained of being warm this AM; no fever curve on chart review. Next HD tomorrow.

## 2016-11-05 NOTE — Progress Notes (Signed)
ANTICOAGULATION CONSULT NOTE - Initial Consult  Pharmacy Consult for heparin Indication: chest pain/ACS  Allergies  Allergen Reactions  . Ace Inhibitors Cough  . Eggs Or Egg-Derived Products Nausea And Vomiting  . Enalapril Cough  . Lisinopril Cough  . Omnipaque [Iohexol] Hives   Patient Measurements: Height: 5' (152.4 cm) Weight: 162 lb (73.5 kg) (b scale) IBW/kg (Calculated) : 45.5 Heparin Dosing Weight: 62 Kg  Vital Signs:    Labs:  Recent Labs  11/03/16 1544 11/03/16 1550 11/04/16 0218 11/04/16 0614  11/05/16 0429 11/05/16 0947 11/05/16 1549  HGB 13.1 15.0 11.2*  --   --  11.4*  --   --   HCT 41.0 44.0 36.0  --   --  36.6  --   --   PLT 142*  --  132*  --   --  153  --   --   HEPARINUNFRC  --   --   --   --   < > 0.16* >2.20* >2.20*  CREATININE 4.83* 4.40* 5.17*  --   --  3.51*  --   --   TROPONINI  --   --  2.12* 2.28*  --   --   --  1.02*  < > = values in this interval not displayed. Estimated Creatinine Clearance: 13.3 mL/min (A) (by C-G formula based on SCr of 3.51 mg/dL (H)).  Medical History: Past Medical History:  Diagnosis Date  . Acute respiratory failure with hypoxia ([place]) 10/23/2014  . Adenomatous duodenal polyp   . Anemia   . Arthritis    "hands" (11/17/2015)  . Atrial tachycardia ([place])    on amiod  . Cardiomyopathy- mixed   . CHF (congestive heart failure) ([place])   . Chronic diastolic heart failure ([place])   . Colon cancer ([place]) 1986  . Colostomy in place [facility])   . Coronary artery disease    Previously decreased EF; echo 113 normal LV function  . Dialysis patient ([place])   . ESRD (end stage renal disease) on dialysis [facility])    Dr Dunham/Dr. Lyda Kalata.  M, W, Fr; [place] (11/17/2015)  . GERD (gastroesophageal reflux disease)   . Glaucoma   . Heart murmur   . Hernia, incisional    abd  . Hyperlipidemia   . Hypertension   . Hypertensive heart disease    sees Dr. Alain Marion  . LBBB (left bundle branch block)   . Mitral valve insufficiency  and aortic valve insufficiency   . Obesity   . Stroke [facility])    2011/12  . Type II diabetes mellitus (HCC)    Medications:  Prescriptions Prior to Admission  Medication Sig Dispense Refill Last Dose  . acetaminophen (TYLENOL) 500 MG tablet Take 500-1,000 mg by mouth every 6 (six) hours as needed (pain).    11/03/2016 at Unknown time  . aspirin 81 MG tablet Take 1 tablet (81 mg total) by mouth daily. 30 tablet 3 11/03/2016 at Unknown time  . calcium acetate (PHOSLO) 667 MG capsule Take 1,334-2,001 mg by mouth See admin instructions. Take 2001mg  three times daily with meals and Take 1334mg   with snacks.   11/02/2016 at Unknown time  . dorzolamide-timolol (COSOPT) 22.3-6.8 MG/ML ophthalmic solution Place 1 drop into the left eye 2 (two) times daily.  12 Past Week at Unknown time  . glucose blood (ONE TOUCH ULTRA TEST) test strip 1 each by Other route daily. And lancets 1/day 100 each 3 11/02/2016 at Unknown time  . lidocaine-prilocaine (EMLA) cream Apply 1  application topically as needed (topical anesthesia for hemodialysis if Gebauers and Lidocaine injection are ineffective.). 30 g 0 11/02/2016 at Unknown time  . metoprolol tartrate (LOPRESSOR) 50 MG tablet Take 1 tablet (50 mg total) by mouth every Tuesday, Thursday, Saturday, and Sunday. 30 tablet 0 11/02/2016 at 4p  . repaglinide (PRANDIN) 0.5 MG tablet TAKE 1 TABLET BY MOUTH THREE TIMES DAILY BEFORE MEALS 90 tablet 3 11/02/2016 at Unknown time  . simvastatin (ZOCOR) 10 MG tablet Take 1 tablet (10 mg total) by mouth every evening. 90 tablet 3 11/02/2016 at Unknown time  . Fluticasone-Salmeterol (ADVAIR) 250-50 MCG/DOSE AEPB Inhale 1 puff into the lungs daily as needed (shortness of breath).    Not Taking at Unknown time  . nitroGLYCERIN (NITROSTAT) 0.4 MG SL tablet Place 1 tablet (0.4 mg total) under the tongue every 5 (five) minutes as needed for chest pain. 30 tablet 3 unk  . pantoprazole (PROTONIX) 40 MG tablet Take 1 tablet (40 mg total) by  mouth daily before breakfast. (Patient not taking: Reported on 11/03/2016) 30 tablet 11 Not Taking at Unknown time  . sucralfate (CARAFATE) 1 GM/10ML suspension Take 10 mLs (1 g total) by mouth 4 (four) times daily -  with meals and at bedtime. (Patient not taking: Reported on 11/03/2016) 420 mL 0 Not Taking at Unknown time   Assessment: [name] is a 71 y.o. female with ESRD presenting to the ED with chest pain and positive troponin. No anticoagulation documented prior to admit. Given extensive disease, cardiology does not think she is a candidate for revascularization. Will treat medically for 48 hours. HgB down slightly from admit 15>11.2. RN reports no bleeding.  Heparin level >2.20 but collected incorrectly; will order another level and asked RN to hold infusion for a couple minutes to help ensure the labs are accurate.  Goal of Therapy:  Heparin level 0.3-0.7 units/ml Monitor platelets by anticoagulation protocol: Yes   Plan:  Continue heparin infusion at 1100 units/hr Check anti-Xa daily while on heparin Continue to monitor H&H and platelets  [name] L [name] 11/05/2016,6:25 PM

## 2016-11-05 NOTE — Progress Notes (Signed)
Informed patient that RN could get supplies to change the colostomy/urostomy bags. Patient refuses and says she will have her daughter bring her supplies today because she's "not using anything strange from here".

## 2016-11-05 NOTE — Progress Notes (Signed)
Urostomy bag emptied, colostomy bag emptied and rinsed per patients request.

## 2016-11-05 NOTE — Progress Notes (Signed)
[place] for heparin Indication: chest pain/ACS  Allergies  Allergen Reactions  . Ace Inhibitors Cough  . Eggs Or Egg-Derived Products Nausea And Vomiting  . Enalapril Cough  . Lisinopril Cough  . Omnipaque [Iohexol] Hives   Patient Measurements: Height: 5' (152.4 cm) Weight: 162 lb (73.5 kg) (b scale) IBW/kg (Calculated) : 45.5 Heparin Dosing Weight: 62 Kg  Vital Signs: Temp: 97.9 F (36.6 C) (10/28 1400) Temp Source: Oral (10/28 1400) BP: 102/49 (10/28 1400) Pulse Rate: 69 (10/28 1400)  Labs:  Recent Labs  11/03/16 1544 11/03/16 1550  11/04/16 0218 11/04/16 0614  11/05/16 0429 11/05/16 0947 11/05/16 1549 11/05/16 1847  HGB 13.1 15.0  --  11.2*  --   --  11.4*  --   --   --   HCT 41.0 44.0  --  36.0  --   --  36.6  --   --   --   PLT 142*  --   --  132*  --   --  153  --   --   --   HEPARINUNFRC  --   --   --   --   --   < > 0.16* >2.20* >2.20* 0.44  CREATININE 4.83* 4.40*  --  5.17*  --   --  3.51*  --   --   --   TROPONINI  --   --   < > 2.12* 2.28*  --   --   --  1.02* 0.90*  < > = values in this interval not displayed. Estimated Creatinine Clearance: 13.3 mL/min (A) (by C-G formula based on SCr of 3.51 mg/dL (H)).  Medical History: Past Medical History:  Diagnosis Date  . Acute respiratory failure with hypoxia ([place]) 10/23/2014  . Adenomatous duodenal polyp   . Anemia   . Arthritis    "hands" (11/17/2015)  . Atrial tachycardia ([place])    on amiod  . Cardiomyopathy- mixed   . CHF (congestive heart failure) ([place])   . Chronic diastolic heart failure ([place])   . Colon cancer ([place]) 1986  . Colostomy in place [facility])   . Coronary artery disease    Previously decreased EF; echo 113 normal LV function  . Dialysis patient ([place])   . ESRD (end stage renal disease) on dialysis [facility])    Dr Dunham/Dr. Lyda Kalata.  M, W, Fr; [place] (11/17/2015)  . GERD (gastroesophageal reflux disease)   . Glaucoma   . Heart murmur   . Hernia,  incisional    abd  . Hyperlipidemia   . Hypertension   . Hypertensive heart disease    sees Dr. Alain Marion  . LBBB (left bundle branch block)   . Mitral valve insufficiency and aortic valve insufficiency   . Obesity   . Stroke [facility])    2011/12  . Type II diabetes mellitus (HCC)    Medications:  Prescriptions Prior to Admission  Medication Sig Dispense Refill Last Dose  . acetaminophen (TYLENOL) 500 MG tablet Take 500-1,000 mg by mouth every 6 (six) hours as needed (pain).    11/03/2016 at Unknown time  . aspirin 81 MG tablet Take 1 tablet (81 mg total) by mouth daily. 30 tablet 3 11/03/2016 at Unknown time  . calcium acetate (PHOSLO) 667 MG capsule Take 1,334-2,001 mg by mouth See admin instructions. Take 2001mg  three times daily with meals and Take 1334mg   with snacks.   11/02/2016 at Unknown time  . dorzolamide-timolol (COSOPT) 22.3-6.8 MG/ML ophthalmic  solution Place 1 drop into the left eye 2 (two) times daily.  12 Past Week at Unknown time  . glucose blood (ONE TOUCH ULTRA TEST) test strip 1 each by Other route daily. And lancets 1/day 100 each 3 11/02/2016 at Unknown time  . lidocaine-prilocaine (EMLA) cream Apply 1 application topically as needed (topical anesthesia for hemodialysis if Gebauers and Lidocaine injection are ineffective.). 30 g 0 11/02/2016 at Unknown time  . metoprolol tartrate (LOPRESSOR) 50 MG tablet Take 1 tablet (50 mg total) by mouth every Tuesday, Thursday, Saturday, and Sunday. 30 tablet 0 11/02/2016 at 4p  . repaglinide (PRANDIN) 0.5 MG tablet TAKE 1 TABLET BY MOUTH THREE TIMES DAILY BEFORE MEALS 90 tablet 3 11/02/2016 at Unknown time  . simvastatin (ZOCOR) 10 MG tablet Take 1 tablet (10 mg total) by mouth every evening. 90 tablet 3 11/02/2016 at Unknown time  . Fluticasone-Salmeterol (ADVAIR) 250-50 MCG/DOSE AEPB Inhale 1 puff into the lungs daily as needed (shortness of breath).    Not Taking at Unknown time  . nitroGLYCERIN (NITROSTAT) 0.4 MG SL tablet Place  1 tablet (0.4 mg total) under the tongue every 5 (five) minutes as needed for chest pain. 30 tablet 3 unk  . pantoprazole (PROTONIX) 40 MG tablet Take 1 tablet (40 mg total) by mouth daily before breakfast. (Patient not taking: Reported on 11/03/2016) 30 tablet 11 Not Taking at Unknown time  . sucralfate (CARAFATE) 1 GM/10ML suspension Take 10 mLs (1 g total) by mouth 4 (four) times daily -  with meals and at bedtime. (Patient not taking: Reported on 11/03/2016) 420 mL 0 Not Taking at Unknown time   Assessment: [name] is a 71 y.o. female with ESRD presenting to the ED with chest pain and positive troponin. No anticoagulation documented prior to admit. Given extensive disease, cardiology does not think she is a candidate for revascularization. Will treat medically for 48 hours. HgB down slightly from admit 15>11.2. RN reports no bleeding.  Heparin level therapeutic on re-draw. No issues with infusion per RN.   Goal of Therapy:  Heparin level 0.3-0.7 units/ml Monitor platelets by anticoagulation protocol: Yes   Plan:  1. Continue heparin infusion at 1100 units/hr 2. Confirmatory heparin level with am labs    Vincenza Hews, PharmD, BCPS 11/05/2016, 9:06 PM

## 2016-11-05 NOTE — Progress Notes (Signed)
[place] for heparin Indication: chest pain/ACS  Allergies  Allergen Reactions  . Ace Inhibitors Cough  . Eggs Or Egg-Derived Products Nausea And Vomiting  . Enalapril Cough  . Lisinopril Cough  . Omnipaque [Iohexol] Hives   Patient Measurements: Height: 5' (152.4 cm) Weight: 162 lb (73.5 kg) (b scale) IBW/kg (Calculated) : 45.5 Heparin Dosing Weight: 62 Kg  Vital Signs: Temp: 98.6 F (37 C) (10/28 0603) Temp Source: Oral (10/28 0603) BP: 109/49 (10/28 0603) Pulse Rate: 87 (10/28 0603)  Labs:  Recent Labs  11/03/16 1544 11/03/16 1550 11/04/16 0218 11/04/16 0614 11/04/16 1923 11/05/16 0429  HGB 13.1 15.0 11.2*  --   --  11.4*  HCT 41.0 44.0 36.0  --   --  36.6  PLT 142*  --  132*  --   --  153  HEPARINUNFRC  --   --   --   --  0.20* 0.16*  CREATININE 4.83* 4.40* 5.17*  --   --  3.51*  TROPONINI  --   --  2.12* 2.28*  --   --    Estimated Creatinine Clearance: 13.3 mL/min (A) (by C-G formula based on SCr of 3.51 mg/dL (H)).  Assessment: 71 y.o. female with chest pain and positive troponin for heparin. Goal of Therapy:  Heparin level 0.3-0.7 units/ml Monitor platelets by anticoagulation protocol: Yes   Plan:  Increase Heparin 1100 units/hr Check heparin level in 8 hours.   Sharod Petsch, [place] Curb 11/05/2016,6:20 AM

## 2016-11-05 NOTE — Progress Notes (Signed)
Patient ID: [name], female   DOB: 11-17-1945, 71 y.o.   MRN: [id number]  PROGRESS NOTE    RICHEL MILLSPAUGH  ZMO:[id number] DOB: Mar 05, 1945 DOA: 11/03/2016 PCP: Cassandria Anger, MD   Brief Narrative:  71 year old female with history of end-stage renal disease on dialysis, hypertension, diabetes mellitus, left bundle branch block, paroxysmal atrial tachycardia status post ablation, CHF and CAD presented with syncopal episode.    Assessment & Plan:   Principal Problem:   Syncope Active Problems:   Hypertensive heart disease   Anemia   ESRD on dialysis (HCC)   S/P transmetatarsal amputation of foot (HCC)   Elevated troponin   Congestive heart failure (CHF) (HCC)   Syncope: Probably vasovagal -Currently feels much better without chest pain - Monitor. - echocardiogram from 10/31/2016 without significant aortic stenosis or other outflow obstruction,  EF was 25-30% and without segmental/Regional wall motion abnormalities.    Positive troponins - Currently no chest pain. Probably from demand ischemia in a patient with history of known coronary disease.  - Cardiology following. Currently on heparin drip. Repeat troponins ordered for today.  Gram-positive cocci bacteremia - Follow identification. Continue vancomycin empirically renally dosed. Repeat culture for today. If has persistent bacteremia, might need TEE and ID consult - Start vancomycin empirically for now.   End-stage renal disease on hemodialysis - Nephrology following. Dialysis as per nephrology recommendations  Multivessel coronary artery disease with last cardiac catheterization on 08/18/2016  - Continue aspirin, statin and metoprolol. Currently chest pain-free.    Chronic combined systolic and diastolic heart failure with last EF of 25-30% based on echo from 10/31/2016 - Volume managed by dialysis  Leukocytosis - Resolved  DM - Continue Accu-Cheks with coverage  H/o Atrial Tachycardia-status  post previous ablation by Dr Curt Bears on 11/17/15 -  currently off amiodarone, continue metoprolol    DVT prophylaxis: SCDs Code Status:  Full Family Communication: None at bedside Disposition Plan: Home in 1-2 days  Consultants: Nephrology and cardiology  Procedures: None  Antimicrobials: None    Subjective: Patient seen and examined at bedside. She denies any current chest pain, shortness of breath. No overnight fever, nausea or vomiting   Objective: Vitals:   11/04/16 2157 11/05/16 0053 11/05/16 0100 11/05/16 0603  BP: (!) 103/46 (!) 109/53  (!) 109/49  Pulse: 79 91  87  Resp:   18 15  Temp: 98.1 F (36.7 C)  98.4 F (36.9 C) 98.6 F (37 C)  TempSrc: Oral  Oral Oral  SpO2: 99% 100%  98%  Weight:    73.5 kg (162 lb)  Height:        Intake/Output Summary (Last 24 hours) at 11/05/16 1136 Last data filed at 11/05/16 0606  Gross per 24 hour  Intake           510.41 ml  Output             -350 ml  Net           860.41 ml   Filed Weights   11/04/16 1245 11/04/16 1650 11/05/16 0603  Weight: 74.7 kg (164 lb 10.9 oz) 74.2 kg (163 lb 9.3 oz) 73.5 kg (162 lb)    Examination:  General exam: Appears calm and comfortable  Respiratory system: Bilateral decreased breath sounds at bases Cardiovascular system: S1 & S2 heard, rate controlled  Gastrointestinal system: Abdomen is nondistended, soft and nontender. Normal bowel sounds heard. Colostomy and urostomy backs present Extremities: No cyanosis, clubbing; trace edema   Data  Reviewed: I have personally reviewed following labs and imaging studies  CBC:  Recent Labs Lab 11/03/16 1544 11/03/16 1550 11/04/16 0218 11/05/16 0429  WBC 14.6*  --  15.2* 7.2  NEUTROABS 12.9*  --   --   --   HGB 13.1 15.0 11.2* 11.4*  HCT 41.0 44.0 36.0 36.6  MCV 89.7  --  89.1 88.4  PLT 142*  --  132* 161   Basic Metabolic Panel:  Recent Labs Lab 11/03/16 1544 11/03/16 1550 11/04/16 0218 11/05/16 0429  NA 141 140 138 137  K  4.0 3.9 3.7 3.5  CL 100* 102 98* 97*  CO2 22  --  24 26  GLUCOSE 110* 116* 146* 107*  BUN 22* 25* 25* 16  CREATININE 4.83* 4.40* 5.17* 3.51*  CALCIUM 9.2  --  8.8* 8.5*  MG  --   --   --  1.8   GFR: Estimated Creatinine Clearance: 13.3 mL/min (A) (by C-G formula based on SCr of 3.51 mg/dL (H)). Liver Function Tests:  Recent Labs Lab 11/03/16 1544  AST 25  ALT 10*  ALKPHOS 97  BILITOT 1.5*  PROT 7.1  ALBUMIN 3.3*   No results for input(s): LIPASE, AMYLASE in the last 168 hours. No results for input(s): AMMONIA in the last 168 hours. Coagulation Profile: No results for input(s): INR, PROTIME in the last 168 hours. Cardiac Enzymes:  Recent Labs Lab 11/04/16 0218 11/04/16 0614  TROPONINI 2.12* 2.28*   BNP (last 3 results) No results for input(s): PROBNP in the last 8760 hours. HbA1C: No results for input(s): HGBA1C in the last 72 hours. CBG:  Recent Labs Lab 11/03/16 2054 11/04/16 0744 11/04/16 2155 11/05/16 0710  GLUCAP 125* 110* 173* 107*   Lipid Profile: No results for input(s): CHOL, HDL, LDLCALC, TRIG, CHOLHDL, LDLDIRECT in the last 72 hours. Thyroid Function Tests: No results for input(s): TSH, T4TOTAL, FREET4, T3FREE, THYROIDAB in the last 72 hours. Anemia Panel: No results for input(s): VITAMINB12, FOLATE, FERRITIN, TIBC, IRON, RETICCTPCT in the last 72 hours. Sepsis Labs: No results for input(s): PROCALCITON, LATICACIDVEN in the last 168 hours.  Recent Results (from the past 240 hour(s))  Culture, blood (Routine X 2) w Reflex to ID Panel     Status: None (Preliminary result)   Collection Time: 11/03/16  5:15 PM  Result Value Ref Range Status   Specimen Description BLOOD RIGHT HAND  Final   Special Requests IN PEDIATRIC BOTTLE Blood Culture adequate volume  Final   Culture NO GROWTH < 12 HOURS  Final   Report Status PENDING  Incomplete  Culture, blood (Routine X 2) w Reflex to ID Panel     Status: None (Preliminary result)   Collection Time:  11/03/16  5:32 PM  Result Value Ref Range Status   Specimen Description BLOOD RIGHT WRIST  Final   Special Requests IN PEDIATRIC BOTTLE Blood Culture adequate volume  Final   Culture  Setup Time   Final    GRAM POSITIVE COCCI IN CHAINS IN PEDIATRIC BOTTLE CRITICAL RESULT CALLED TO, READ BACK BY AND VERIFIED WITH: N. GAZDA PHARMD, AT 1056 11/04/16 BY D. VANHOOK    Culture GRAM POSITIVE COCCI  Final   Report Status PENDING  Incomplete  MRSA PCR Screening     Status: Abnormal   Collection Time: 11/04/16  2:31 AM  Result Value Ref Range Status   MRSA by PCR POSITIVE (A) NEGATIVE Final    Comment:        The GeneXpert MRSA  Assay (FDA approved for NASAL specimens only), is one component of a comprehensive MRSA colonization surveillance program. It is not intended to diagnose MRSA infection nor to guide or monitor treatment for MRSA infections. RESULT CALLED TO, READ BACK BY AND VERIFIED WITH: K BRADLEY,RN AT 0750 11/04/16 BY L BENFIELD          Radiology Studies: Dg Chest Portable 1 View  Result Date: 11/03/2016 CLINICAL DATA:  Syncope and weakness.  End-stage renal disease. EXAM: PORTABLE CHEST 1 VIEW COMPARISON:  08/17/2016 and 10/23/2014 FINDINGS: Lungs are adequately inflated without focal airspace consolidation or effusion. Minimal prominence of the right hilar region unchanged. Moderate stable cardiomegaly. Calcified plaque over the thoracic aorta. Remainder of the exam is unchanged. IMPRESSION: No acute cardiopulmonary disease. Moderate stable cardiomegaly. Aortic atherosclerosis. Electronically Signed   By: Marin Olp M.D.   On: 11/03/2016 13:44   Dg Abd 2 Views  Result Date: 11/03/2016 CLINICAL DATA:  Syncopal episode EXAM: ABDOMEN - 2 VIEW COMPARISON:  04/07/2010, CT 05/21/2011 FINDINGS: Cardiomegaly. No free air beneath the diaphragm. Extensive vascular calcifications in the upper abdomen. Nonobstructed bowel-gas pattern. Multiple surgical clips in the lower abdomen  and pelvis. Postsurgical changes in the pelvis. IMPRESSION: Nonobstructed bowel-gas pattern.  Extensive vascular calcifications. Electronically Signed   By: Donavan Foil M.D.   On: 11/03/2016 18:38        Scheduled Meds: . aspirin EC  81 mg Oral Daily  . calcium acetate  2,001 mg Oral TID WC  . dorzolamide-timolol  1 drop Left Eye BID  . feeding supplement  1 Container Oral BID BM  . insulin aspart  1 Units Subcutaneous TID WC  . metoprolol tartrate  50 mg Oral Q T,Th,S,Su  . senna  1 tablet Oral BID  . simvastatin  10 mg Oral QPM  . sodium chloride flush  3 mL Intravenous Q12H  . sucralfate  1 g Oral TID WC & HS   Continuous Infusions: . sodium chloride    . heparin 1,100 Units/hr (11/05/16 2633)     LOS: 1 day        Aline August, MD [facility] Pager [phone]  If 7PM-7AM, please contact night-coverage [email or web address] Password [email or web address] 11/05/2016, 11:36 AM

## 2016-11-06 DIAGNOSIS — B955 Unspecified streptococcus as the cause of diseases classified elsewhere: Secondary | ICD-10-CM

## 2016-11-06 DIAGNOSIS — I2510 Atherosclerotic heart disease of native coronary artery without angina pectoris: Secondary | ICD-10-CM

## 2016-11-06 LAB — BASIC METABOLIC PANEL
ANION GAP: 20 — AB (ref 5–15)
BUN: 22 mg/dL — ABNORMAL HIGH (ref 6–20)
CHLORIDE: 102 mmol/L (ref 101–111)
CO2: 15 mmol/L — AB (ref 22–32)
CREATININE: 4.11 mg/dL — AB (ref 0.44–1.00)
Calcium: 8.3 mg/dL — ABNORMAL LOW (ref 8.9–10.3)
GFR calc non Af Amer: 10 mL/min — ABNORMAL LOW (ref >60)
GFR, EST AFRICAN AMERICAN: 12 mL/min — AB (ref >60)
Glucose, Bld: 183 mg/dL — ABNORMAL HIGH (ref 65–99)
POTASSIUM: 4.2 mmol/L (ref 3.5–5.1)
SODIUM: 137 mmol/L (ref 135–145)

## 2016-11-06 LAB — GLUCOSE, CAPILLARY
GLUCOSE-CAPILLARY: 142 mg/dL — AB (ref 65–99)
GLUCOSE-CAPILLARY: 226 mg/dL — AB (ref 65–99)
Glucose-Capillary: 117 mg/dL — ABNORMAL HIGH (ref 65–99)

## 2016-11-06 LAB — CBC
HEMATOCRIT: 39.7 % (ref 36.0–46.0)
HEMOGLOBIN: 12.3 g/dL (ref 12.0–15.0)
MCH: 28 pg (ref 26.0–34.0)
MCHC: 31 g/dL (ref 30.0–36.0)
MCV: 90.4 fL (ref 78.0–100.0)
Platelets: 148 10*3/uL — ABNORMAL LOW (ref 150–400)
RBC: 4.39 MIL/uL (ref 3.87–5.11)
RDW: 17.3 % — ABNORMAL HIGH (ref 11.5–15.5)
WBC: 6.1 10*3/uL (ref 4.0–10.5)

## 2016-11-06 LAB — TROPONIN I: TROPONIN I: 0.66 ng/mL — AB (ref <0.03)

## 2016-11-06 LAB — CULTURE, BLOOD (ROUTINE X 2): Special Requests: ADEQUATE

## 2016-11-06 LAB — MAGNESIUM: MAGNESIUM: 1.9 mg/dL (ref 1.7–2.4)

## 2016-11-06 LAB — HEPARIN LEVEL (UNFRACTIONATED): HEPARIN UNFRACTIONATED: 0.19 [IU]/mL — AB (ref 0.30–0.70)

## 2016-11-06 MED ORDER — CHLORHEXIDINE GLUCONATE CLOTH 2 % EX PADS
6.0000 | MEDICATED_PAD | Freq: Every day | CUTANEOUS | Status: DC
  Administered 2016-11-06: 6 via TOPICAL

## 2016-11-06 MED ORDER — MUPIROCIN 2 % EX OINT
1.0000 "application " | TOPICAL_OINTMENT | Freq: Two times a day (BID) | CUTANEOUS | Status: DC
  Administered 2016-11-06: 1 via NASAL
  Filled 2016-11-06: qty 22

## 2016-11-06 MED ORDER — ONDANSETRON HCL 4 MG PO TABS: 4.0000 mg | ORAL_TABLET | Freq: Four times a day (QID) | ORAL | 0 refills | Status: DC | PRN

## 2016-11-06 NOTE — Progress Notes (Signed)
Reviewed discharge instructions/medications  with patient and patient's daughter. Answered their questions. Waiting for a callback from doctor about her diabetic medication. Patient will be ready for discharge after that. Pt is stable.

## 2016-11-06 NOTE — Progress Notes (Signed)
Physical Therapy Treatment Patient Details Name: [name] MRN: [id number] DOB: [birth year] Today's Date: 11/06/2016    History of Present Illness Pt adm with syncope likely vasovagal. PMH - ESRD on HD, rt transmet amputation, htn, chf, cad,     PT Comments    Pt required min guard assist transfers and gait. Pt returned to bed at end of session due to transporting to HD soon. Pt reports she is at her baseline. Declines HHPT. She has a PCA at home to assist with ADLs.   Follow Up Recommendations  No PT follow up;Supervision for mobility/OOB     Equipment Recommendations  None recommended by PT    Recommendations for Other Services       Precautions / Restrictions Precautions Precautions: Fall Restrictions Weight Bearing Restrictions: No    Mobility  Bed Mobility         Supine to sit: Min assist;HOB elevated     General bed mobility comments: +rail, assist to elevate trunk  Transfers   Equipment used: Rolling walker (2 wheeled)   Sit to Stand: Min guard         General transfer comment: verbal cues for hand placement, assist for safety  Ambulation/Gait Ambulation/Gait assistance: Min guard Ambulation Distance (Feet): 50 Feet Assistive device: Rolling walker (2 wheeled) Gait Pattern/deviations: Step-through pattern;Trunk flexed;Decreased stride length Gait velocity: decreased Gait velocity interpretation: Below normal speed for age/gender General Gait Details: assist for safety. Verbal cues for posture.   Stairs            Wheelchair Mobility    Modified Rankin (Stroke Patients Only)       Balance   Sitting-balance support: No upper extremity supported;Feet supported Sitting balance-Leahy Scale: Fair     Standing balance support: Bilateral upper extremity supported Standing balance-Leahy Scale: Poor Standing balance comment: walker for static standing                            Cognition Arousal/Alertness:  Awake/alert Behavior During Therapy: WFL for tasks assessed/performed Overall Cognitive Status: Within Functional Limits for tasks assessed                                        Exercises      General Comments        Pertinent Vitals/Pain Pain Assessment: No/denies pain    Home Living                      Prior Function            PT Goals (current goals can now be found in the care plan section) Acute Rehab PT Goals Patient Stated Goal: return home PT Goal Formulation: With patient Time For Goal Achievement: 11/12/16 Potential to Achieve Goals: Good Progress towards PT goals: Progressing toward goals    Frequency    Min 3X/week      PT Plan Current plan remains appropriate    Co-evaluation              AM-PAC PT "6 Clicks" Daily Activity  Outcome Measure  Difficulty turning over in bed (including adjusting bedclothes, sheets and blankets)?: A Little Difficulty moving from lying on back to sitting on the side of the bed? : Unable Difficulty sitting down on and standing up from a chair with arms (e.g., wheelchair, bedside commode,  etc,.)?: Unable Help needed moving to and from a bed to chair (including a wheelchair)?: A Little Help needed walking in hospital room?: A Little Help needed climbing 3-5 steps with a railing? : A Lot 6 Click Score: 13    End of Session Equipment Utilized During Treatment: Gait belt Activity Tolerance: Patient tolerated treatment well Patient left: in bed;with call bell/phone within reach Nurse Communication: Mobility status PT Visit Diagnosis: Unsteadiness on feet (R26.81);Muscle weakness (generalized) (M62.81)     Time: 3154-0086 PT Time Calculation (min) (ACUTE ONLY): 17 min  Charges:  $Gait Training: 8-22 mins                    G Codes:       Lorrin Goodell, PT  Office # [phone] Pager [phone]    Lorriane Shire 11/06/2016, 11:34 AM

## 2016-11-06 NOTE — Plan of Care (Signed)
Problem: Activity: Goal: Risk for activity intolerance will decrease Outcome: Progressing Pt is walking to bathroom and tolerating well.

## 2016-11-06 NOTE — Progress Notes (Signed)
[facility] Progress Note   Subjective:  Seen in room, no SOB or CP, no new c/o's.    Objective Vitals:   11/05/16 2143 11/06/16 0704 11/06/16 0804 11/06/16 0834  BP: (!) 99/54 (!) 115/52 (!) 97/47 97/74  Pulse: 66 76 60 60  Resp:  19    Temp: 97.9 F (36.6 C) 98.3 F (36.8 C)    TempSrc: Oral Oral    SpO2: 96% 97% 99% 99%  Weight:  74.5 kg (164 lb 4.8 oz)    Height:       Physical Exam General: Well appearing, NAD. Heart: RRR; no murmur Lungs: CTAB Abdomen: soft, non-tender Extremities: No LE edema Dialysis Access: LUE AVF + bruit   HD: MWF at [place], 3K/2Ca bath, EDW 74.5kg, Heparin 3K bolus, AVF - Calcitriol 62mcg PO q HD - No ESA (last Hgb > 12)  Assessment/Plan: 1. Syncope: per primary likely vasovagal.  No change to dry wt.  Had 1/2 blood cx+ for strep viridans, felt to be contaminant. Vanc stopped. For dc after HD today. 2. NSTEMI/3v-CAD/ICM (EF 25-30%): stable 3. ESRD: MWF HD.  HD today 4. HTN/volume: BP stable, no hypotension. At dry wt and euvolemic on exam.   5. Anemia: Hgb 11.4. No ESA yet. 6. Secondary hyperparathyroidism: Corr Ca ok, continue Phoslo as binder. Add VDRA.  7. Nutrition: Alb low, on protein supps. 8. Type 2 DM: On insulin, per primary. 9. Hx SVT: On metoprolol.   PLan - HD today, for dc after HD.      Additional Objective Labs: Basic Metabolic Panel:  Recent Labs Lab 11/04/16 0218 11/05/16 0429 11/06/16 0301  NA 138 137 137  K 3.7 3.5 4.2  CL 98* 97* 102  CO2 24 26 15*  GLUCOSE 146* 107* 183*  BUN 25* 16 22*  CREATININE 5.17* 3.51* 4.11*  CALCIUM 8.8* 8.5* 8.3*   Liver Function Tests:  Recent Labs Lab 11/03/16 1544  AST 25  ALT 10*  ALKPHOS 97  BILITOT 1.5*  PROT 7.1  ALBUMIN 3.3*   CBC:  Recent Labs Lab 11/03/16 1544  11/04/16 0218 11/05/16 0429 11/06/16 0301  WBC 14.6*  --  15.2* 7.2 6.1  NEUTROABS 12.9*  --   --   --   --   HGB 13.1  < > 11.2* 11.4* 12.3  HCT 41.0   < > 36.0 36.6 39.7  MCV 89.7  --  89.1 88.4 90.4  PLT 142*  --  132* 153 148*  < > = values in this interval not displayed. Blood Culture    Component Value Date/Time   SDES BLOOD RIGHT WRIST 11/03/2016 1732   SPECREQUEST IN PEDIATRIC BOTTLE Blood Culture adequate volume 11/03/2016 1732   CULT (A) 11/03/2016 1732    VIRIDANS STREPTOCOCCUS THE SIGNIFICANCE OF ISOLATING THIS ORGANISM FROM A SINGLE SET OF BLOOD CULTURES WHEN MULTIPLE SETS ARE DRAWN IS UNCERTAIN. PLEASE NOTIFY THE MICROBIOLOGY DEPARTMENT WITHIN ONE WEEK IF SPECIATION AND SENSITIVITIES ARE REQUIRED.    REPTSTATUS 11/06/2016 FINAL 11/03/2016 1732    Cardiac Enzymes:  Recent Labs Lab 11/04/16 0218 11/04/16 0614 11/05/16 1549 11/05/16 1847 11/06/16 0301  TROPONINI 2.12* 2.28* 1.02* 0.90* 0.66*   CBG:  Recent Labs Lab 11/05/16 0710 11/05/16 1235 11/05/16 1648 11/05/16 2129 11/06/16 0746  GLUCAP 107* 206* 211* 157* 142*   Studies/Results: No results found. Medications: . sodium chloride     . aspirin EC  81 mg Oral Daily  . calcium acetate  2,001 mg  Oral TID WC  . Chlorhexidine Gluconate Cloth  6 each Topical Q0600  . dorzolamide-timolol  1 drop Left Eye BID  . feeding supplement  1 Container Oral BID BM  . insulin aspart  1 Units Subcutaneous TID WC  . metoprolol tartrate  50 mg Oral Q T,Th,S,Su  . mupirocin ointment  1 application Nasal BID  . senna  1 tablet Oral BID  . simvastatin  10 mg Oral QPM  . sodium chloride flush  3 mL Intravenous Q12H  . sucralfate  1 g Oral TID WC & HS

## 2016-11-06 NOTE — Progress Notes (Signed)
Self care

## 2016-11-06 NOTE — Care Management Note (Signed)
Case Management Note  Patient Details  Name: [name] MRN: [id number] Date of Birth: 09-Mar-1945  Subjective/Objective:    Syncope               Action/Plan: Patient lives at home; PCP Plotnikov, Evie Lacks, MD; has private insurance with Medicare / BCBS with prescription drug coverage; per physical therapy eval, no HHC services needed at this time; CM will continue to follow for DCP  Expected Discharge Date:  11/06/16               Expected Discharge Plan:  Home/Self Care  Discharge planning Services  CM Consult  Status of Service:  In process, will continue to follow  Sherrilyn Rist [phone] 11/06/2016, 11:51 AM

## 2016-11-06 NOTE — Discharge Summary (Signed)
Physician Discharge Summary  [name] EPP:[id number] DOB: 08-Jul-1945 DOA: 11/03/2016  PCP: Cassandria Anger, MD  Admit date: 11/03/2016 Discharge date: 11/06/2016  Admitted From: Home Disposition:  Home  Recommendations for Outpatient Follow-up:  1. Follow up with PCP in 1week 2. Follow up with outpatient dialysis as scheduled 3. Follow-up with cardiology/Dr. Caryl Comes in 1-2 weeks 4. Follow-up in the ED if symptoms worsen or new appear  Home Health: No  Equipment/Devices: None  Discharge Condition: Stable  CODE STATUS: Full  Diet recommendation: Heart Healthy / Carb Modified / renal hemodialysis diet  Brief/Interim Summary: 71 year old female with history of end-stage renal disease on dialysis, hypertension, diabetes mellitus, left bundle branch block, paroxysmal atrial tachycardia status post ablation, CHF and CAD presented with syncopal episode. Nephrology was consulted. Patient's troponin was found to be positive for which cardiology was consulted. Initially patient was started on heparin drip, but troponins trended down and patient never had any chest pain. Cardiology cleared the patient for discharge. Patient also had bacteremia which was thought to be contaminant. She will be discharged after dialysis today  Discharge Diagnoses:  Principal Problem:   Syncope Active Problems:   Hypertensive heart disease   Anemia   ESRD on dialysis (HCC)   S/P transmetatarsal amputation of foot (HCC)   Elevated troponin   Congestive heart failure (CHF) (HCC)  Syncope: Probably vasovagal -Currently feels much better without chest pain - echocardiogram from 10/31/2016 withoutsignificant aortic stenosis or other outflow obstruction, EF was 25-30% and withoutsegmental/Regional wall motion abnormalities.   Positive troponins - Currently no chest pain. Probably from demand ischemia in a patient with history of known coronary disease.  - Patient has been on heparin drip as per  cardiology recommendations. Troponins are coming down. No chest pain. DC heparin drip and outpatient follow-up with cardiology. No further cardiac workup indicated as per cardiology recommendations at this time.  Strep viridans bacteremia most likely contaminant - Initially empirically started on vancomycin; vancomycin discontinued after identification of bacteria. Repeat blood cultures negative so far. Monitor off antibiotics.  End-stage renal disease on hemodialysis - Nephrology following. Follow up with outpatient dialysis as scheduled. Patient will be discharged home after dialysis today  Multivessel coronary artery disease with last cardiac catheterization on 08/18/2016  - Continue aspirin, statin and metoprolol. Currently chest pain-free.    Chronic combined systolic and diastolic heart failure with last EF of 25-30% based on echo from 10/31/2016 - Volume managed by dialysis  Leukocytosis - Resolved  DM - Prandin will be held up on discharge. Follow-up with primary care provider regarding decision to resume Prandin  H/o Atrial Tachycardia-status post previous ablation by Dr Curt Bears on 11/17/15 -  currently off amiodarone, continue metoprolol. Outpatient follow-up with Dr. Caryl Comes   Discharge Instructions  Discharge Instructions    Ambulatory referral to Cardiology    Complete by:  As directed    Follow up in 1-2 weeks   Call MD for:  difficulty breathing, headache or visual disturbances    Complete by:  As directed    Call MD for:  extreme fatigue    Complete by:  As directed    Call MD for:  hives    Complete by:  As directed    Call MD for:  persistant dizziness or light-headedness    Complete by:  As directed    Call MD for:  persistant nausea and vomiting    Complete by:  As directed    Call MD for:  severe uncontrolled  pain    Complete by:  As directed    Call MD for:  temperature >100.4    Complete by:  As directed    Diet - low sodium heart healthy     Complete by:  As directed    Diet Carb Modified    Complete by:  As directed    Increase activity slowly    Complete by:  As directed      Allergies as of 11/06/2016      Reactions   Ace Inhibitors Cough   Eggs Or Egg-derived Products Nausea And Vomiting   Enalapril Cough   Lisinopril Cough   Omnipaque [iohexol] Hives      Medication List    STOP taking these medications   repaglinide 0.5 MG tablet Commonly known as:  PRANDIN   sucralfate 1 GM/10ML suspension Commonly known as:  CARAFATE     TAKE these medications   acetaminophen 500 MG tablet Commonly known as:  TYLENOL Take 500-1,000 mg by mouth every 6 (six) hours as needed (pain).   aspirin 81 MG tablet Take 1 tablet (81 mg total) by mouth daily.   calcium acetate 667 MG capsule Commonly known as:  PHOSLO Take 1,334-2,001 mg by mouth See admin instructions. Take 2001mg  three times daily with meals and Take 1334mg   with snacks.   dorzolamide-timolol 22.3-6.8 MG/ML ophthalmic solution Commonly known as:  COSOPT Place 1 drop into the left eye 2 (two) times daily.   Fluticasone-Salmeterol 250-50 MCG/DOSE Aepb Commonly known as:  ADVAIR Inhale 1 puff into the lungs daily as needed (shortness of breath).   glucose blood test strip Commonly known as:  ONE TOUCH ULTRA TEST 1 each by Other route daily. And lancets 1/day   lidocaine-prilocaine cream Commonly known as:  EMLA Apply 1 application topically as needed (topical anesthesia for hemodialysis if Gebauers and Lidocaine injection are ineffective.).   metoprolol tartrate 50 MG tablet Commonly known as:  LOPRESSOR Take 1 tablet (50 mg total) by mouth every Tuesday, Thursday, Saturday, and Sunday.   nitroGLYCERIN 0.4 MG SL tablet Commonly known as:  NITROSTAT Place 1 tablet (0.4 mg total) under the tongue every 5 (five) minutes as needed for chest pain.   ondansetron 4 MG tablet Commonly known as:  ZOFRAN Take 1 tablet (4 mg total) by mouth every 6 (six)  hours as needed for nausea.   pantoprazole 40 MG tablet Commonly known as:  PROTONIX Take 1 tablet (40 mg total) by mouth daily before breakfast.   simvastatin 10 MG tablet Commonly known as:  ZOCOR Take 1 tablet (10 mg total) by mouth every evening.      Follow-up Information    Plotnikov, Evie Lacks, MD. Schedule an appointment as soon as possible for a visit in 1 week(s).   Specialty:  Internal Medicine Contact information: [place] [address] [phone]        Deboraha Sprang, MD. Schedule an appointment as soon as possible for a visit.   Specialty:  Cardiology Why:  follow up in 1-2 weeks Contact information: [address] [place] [address] [phone]          Allergies  Allergen Reactions  . Ace Inhibitors Cough  . Eggs Or Egg-Derived Products Nausea And Vomiting  . Enalapril Cough  . Lisinopril Cough  . Omnipaque [Iohexol] Hives    Consultations:  Cardiology and nephrology   Procedures/Studies: Dg Chest Portable 1 View  Result Date: 11/03/2016 CLINICAL DATA:  Syncope and weakness.  End-stage renal disease. EXAM: PORTABLE CHEST 1 VIEW COMPARISON:  08/17/2016 and 10/23/2014 FINDINGS: Lungs are adequately inflated without focal airspace consolidation or effusion. Minimal prominence of the right hilar region unchanged. Moderate stable cardiomegaly. Calcified plaque over the thoracic aorta. Remainder of the exam is unchanged. IMPRESSION: No acute cardiopulmonary disease. Moderate stable cardiomegaly. Aortic atherosclerosis. Electronically Signed   By: Marin Olp M.D.   On: 11/03/2016 13:44   Dg Abd 2 Views  Result Date: 11/03/2016 CLINICAL DATA:  Syncopal episode EXAM: ABDOMEN - 2 VIEW COMPARISON:  04/07/2010, CT 05/21/2011 FINDINGS: Cardiomegaly. No free air beneath the diaphragm. Extensive vascular calcifications in the upper abdomen. Nonobstructed bowel-gas pattern. Multiple surgical clips in the lower abdomen  and pelvis. Postsurgical changes in the pelvis. IMPRESSION: Nonobstructed bowel-gas pattern.  Extensive vascular calcifications. Electronically Signed   By: Donavan Foil M.D.   On: 11/03/2016 18:38     Subjective: Patient seen and examined at bedside. She had nausea and vomiting earlier this morning but she feels much better now. She denies any current chest pain. She wants to go home after dialysis today. No overnight fever.  Discharge Exam: Vitals:   11/06/16 0704 11/06/16 0804  BP: (!) 115/52 (!) 97/47  Pulse: 76 60  Resp: 19   Temp: 98.3 F (36.8 C)   SpO2: 97% 99%   Vitals:   11/05/16 1400 11/05/16 2143 11/06/16 0704 11/06/16 0804  BP: (!) 102/49 (!) 99/54 (!) 115/52 (!) 97/47  Pulse: 69 66 76 60  Resp: 18  19   Temp: 97.9 F (36.6 C) 97.9 F (36.6 C) 98.3 F (36.8 C)   TempSrc: Oral Oral Oral   SpO2: 100% 96% 97% 99%  Weight:   74.5 kg (164 lb 4.8 oz)   Height:        General: Pt is alert, awake, not in acute distress Cardiovascular: Rate controlled, S1/S2 + Respiratory: Bilateral decreased breath sounds at bases Abdominal: Soft, NT, ND, bowel sounds +; Colostomy and urostomy bags present Extremities: no edema, no cyanosis    The results of significant diagnostics from this hospitalization (including imaging, microbiology, ancillary and laboratory) are listed below for reference.     Microbiology: Recent Results (from the past 240 hour(s))  Culture, blood (Routine X 2) w Reflex to ID Panel     Status: None (Preliminary result)   Collection Time: 11/03/16  5:15 PM  Result Value Ref Range Status   Specimen Description BLOOD RIGHT HAND  Final   Special Requests IN PEDIATRIC BOTTLE Blood Culture adequate volume  Final   Culture NO GROWTH 2 DAYS  Final   Report Status PENDING  Incomplete  Culture, blood (Routine X 2) w Reflex to ID Panel     Status: Abnormal   Collection Time: 11/03/16  5:32 PM  Result Value Ref Range Status   Specimen Description BLOOD RIGHT  WRIST  Final   Special Requests IN PEDIATRIC BOTTLE Blood Culture adequate volume  Final   Culture  Setup Time   Final    GRAM POSITIVE COCCI IN CHAINS IN PEDIATRIC BOTTLE CRITICAL RESULT CALLED TO, READ BACK BY AND VERIFIED WITH: N. GAZDA PHARMD, AT 1056 11/04/16 BY D. VANHOOK    Culture (A)  Final    VIRIDANS STREPTOCOCCUS THE SIGNIFICANCE OF ISOLATING THIS ORGANISM FROM A SINGLE SET OF BLOOD CULTURES WHEN MULTIPLE SETS ARE DRAWN IS UNCERTAIN. PLEASE NOTIFY THE MICROBIOLOGY DEPARTMENT WITHIN ONE WEEK IF SPECIATION AND SENSITIVITIES ARE REQUIRED.    Report  Status 11/06/2016 FINAL  Final  MRSA PCR Screening     Status: Abnormal   Collection Time: 11/04/16  2:31 AM  Result Value Ref Range Status   MRSA by PCR POSITIVE (A) NEGATIVE Final    Comment:        The GeneXpert MRSA Assay (FDA approved for NASAL specimens only), is one component of a comprehensive MRSA colonization surveillance program. It is not intended to diagnose MRSA infection nor to guide or monitor treatment for MRSA infections. RESULT CALLED TO, READ BACK BY AND VERIFIED WITH: K BRADLEY,RN AT 0750 11/04/16 BY L BENFIELD      Labs: BNP (last 3 results)  Recent Labs  07/23/16 1640  BNP >3,762.8*   Basic Metabolic Panel:  Recent Labs Lab 11/03/16 1544 11/03/16 1550 11/04/16 0218 11/05/16 0429 11/06/16 0301  NA 141 140 138 137 137  K 4.0 3.9 3.7 3.5 4.2  CL 100* 102 98* 97* 102  CO2 22  --  24 26 15*  GLUCOSE 110* 116* 146* 107* 183*  BUN 22* 25* 25* 16 22*  CREATININE 4.83* 4.40* 5.17* 3.51* 4.11*  CALCIUM 9.2  --  8.8* 8.5* 8.3*  MG  --   --   --  1.8 1.9   Liver Function Tests:  Recent Labs Lab 11/03/16 1544  AST 25  ALT 10*  ALKPHOS 97  BILITOT 1.5*  PROT 7.1  ALBUMIN 3.3*   No results for input(s): LIPASE, AMYLASE in the last 168 hours. No results for input(s): AMMONIA in the last 168 hours. CBC:  Recent Labs Lab 11/03/16 1544 11/03/16 1550 11/04/16 0218 11/05/16 0429  11/06/16 0301  WBC 14.6*  --  15.2* 7.2 6.1  NEUTROABS 12.9*  --   --   --   --   HGB 13.1 15.0 11.2* 11.4* 12.3  HCT 41.0 44.0 36.0 36.6 39.7  MCV 89.7  --  89.1 88.4 90.4  PLT 142*  --  132* 153 148*   Cardiac Enzymes:  Recent Labs Lab 11/04/16 0218 11/04/16 0614 11/05/16 1549 11/05/16 1847 11/06/16 0301  TROPONINI 2.12* 2.28* 1.02* 0.90* 0.66*   BNP: Invalid input(s): POCBNP CBG:  Recent Labs Lab 11/05/16 0710 11/05/16 1235 11/05/16 1648 11/05/16 2129 11/06/16 0746  GLUCAP 107* 206* 211* 157* 142*   D-Dimer No results for input(s): DDIMER in the last 72 hours. Hgb A1c No results for input(s): HGBA1C in the last 72 hours. Lipid Profile No results for input(s): CHOL, HDL, LDLCALC, TRIG, CHOLHDL, LDLDIRECT in the last 72 hours. Thyroid function studies No results for input(s): TSH, T4TOTAL, T3FREE, THYROIDAB in the last 72 hours.  Invalid input(s): FREET3 Anemia work up No results for input(s): VITAMINB12, FOLATE, FERRITIN, TIBC, IRON, RETICCTPCT in the last 72 hours. Urinalysis    Component Value Date/Time   COLORURINE YELLOW 11/03/2016 1641   APPEARANCEUR TURBID (A) 11/03/2016 1641   LABSPEC 1.012 11/03/2016 1641   PHURINE 7.0 11/03/2016 1641   GLUCOSEU NEGATIVE 11/03/2016 1641   HGBUR LARGE (A) 11/03/2016 1641   BILIRUBINUR NEGATIVE 11/03/2016 1641   KETONESUR NEGATIVE 11/03/2016 1641   PROTEINUR 100 (A) 11/03/2016 1641   UROBILINOGEN 4.0 (H) 05/20/2011 2219   NITRITE NEGATIVE 11/03/2016 1641   LEUKOCYTESUR LARGE (A) 11/03/2016 1641   Sepsis Labs Invalid input(s): PROCALCITONIN,  WBC,  LACTICIDVEN Microbiology Recent Results (from the past 240 hour(s))  Culture, blood (Routine X 2) w Reflex to ID Panel     Status: None (Preliminary result)   Collection Time: 11/03/16  5:15  PM  Result Value Ref Range Status   Specimen Description BLOOD RIGHT HAND  Final   Special Requests IN PEDIATRIC BOTTLE Blood Culture adequate volume  Final   Culture NO  GROWTH 2 DAYS  Final   Report Status PENDING  Incomplete  Culture, blood (Routine X 2) w Reflex to ID Panel     Status: Abnormal   Collection Time: 11/03/16  5:32 PM  Result Value Ref Range Status   Specimen Description BLOOD RIGHT WRIST  Final   Special Requests IN PEDIATRIC BOTTLE Blood Culture adequate volume  Final   Culture  Setup Time   Final    GRAM POSITIVE COCCI IN CHAINS IN PEDIATRIC BOTTLE CRITICAL RESULT CALLED TO, READ BACK BY AND VERIFIED WITH: N. GAZDA PHARMD, AT 1056 11/04/16 BY D. VANHOOK    Culture (A)  Final    VIRIDANS STREPTOCOCCUS THE SIGNIFICANCE OF ISOLATING THIS ORGANISM FROM A SINGLE SET OF BLOOD CULTURES WHEN MULTIPLE SETS ARE DRAWN IS UNCERTAIN. PLEASE NOTIFY THE MICROBIOLOGY DEPARTMENT WITHIN ONE WEEK IF SPECIATION AND SENSITIVITIES ARE REQUIRED.    Report Status 11/06/2016 FINAL  Final  MRSA PCR Screening     Status: Abnormal   Collection Time: 11/04/16  2:31 AM  Result Value Ref Range Status   MRSA by PCR POSITIVE (A) NEGATIVE Final    Comment:        The GeneXpert MRSA Assay (FDA approved for NASAL specimens only), is one component of a comprehensive MRSA colonization surveillance program. It is not intended to diagnose MRSA infection nor to guide or monitor treatment for MRSA infections. RESULT CALLED TO, READ BACK BY AND VERIFIED WITH: K BRADLEY,RN AT 0750 11/04/16 BY L BENFIELD      Time coordinating discharge: 35 minutes  SIGNED:   Aline August, MD  [facility] 11/06/2016, 9:03 AM Pager: [phone]  If 7PM-7AM, please contact night-coverage [email or web address] Password [email or web address]

## 2016-11-06 NOTE — Progress Notes (Addendum)
[place] for heparin Indication: chest pain/ACS  Allergies  Allergen Reactions  . Ace Inhibitors Cough  . Eggs Or Egg-Derived Products Nausea And Vomiting  . Enalapril Cough  . Lisinopril Cough  . Omnipaque [Iohexol] Hives   Patient Measurements: Height: 5' (152.4 cm) Weight: 164 lb 4.8 oz (74.5 kg) (b scale) IBW/kg (Calculated) : 45.5 Heparin Dosing Weight: 62 Kg  Vital Signs: Temp: 98.3 F (36.8 C) (10/29 0704) Temp Source: Oral (10/29 0704) BP: 115/52 (10/29 0704) Pulse Rate: 76 (10/29 0704)  Labs:  Recent Labs  11/04/16 0218  11/05/16 0429  11/05/16 1549 11/05/16 1847 11/06/16 0301 11/06/16 0611  HGB 11.2*  --  11.4*  --   --   --  12.3  --   HCT 36.0  --  36.6  --   --   --  39.7  --   PLT 132*  --  153  --   --   --  148*  --   HEPARINUNFRC  --   < > 0.16*  < > >2.20* 0.44  --  0.19*  CREATININE 5.17*  --  3.51*  --   --   --  4.11*  --   TROPONINI 2.12*  < >  --   --  1.02* 0.90* 0.66*  --   < > = values in this interval not displayed. Estimated Creatinine Clearance: 11.5 mL/min (A) (by C-G formula based on SCr of 4.11 mg/dL (H)).  Medical History: Past Medical History:  Diagnosis Date  . Acute respiratory failure with hypoxia ([place]) 10/23/2014  . Adenomatous duodenal polyp   . Anemia   . Arthritis    "hands" (11/17/2015)  . Atrial tachycardia ([place])    on amiod  . Cardiomyopathy- mixed   . CHF (congestive heart failure) ([place])   . Chronic diastolic heart failure ([place])   . Colon cancer ([place]) 1986  . Colostomy in place [facility])   . Coronary artery disease    Previously decreased EF; echo 113 normal LV function  . Dialysis patient ([place])   . ESRD (end stage renal disease) on dialysis [facility])    Dr Dunham/Dr. Lyda Kalata.  M, W, Fr; [place] (11/17/2015)  . GERD (gastroesophageal reflux disease)   . Glaucoma   . Heart murmur   . Hernia, incisional    abd  . Hyperlipidemia   . Hypertension   . Hypertensive heart  disease    sees Dr. Alain Marion  . LBBB (left bundle branch block)   . Mitral valve insufficiency and aortic valve insufficiency   . Obesity   . Stroke [facility])    2011/12  . Type II diabetes mellitus (HCC)    Medications:  Prescriptions Prior to Admission  Medication Sig Dispense Refill Last Dose  . acetaminophen (TYLENOL) 500 MG tablet Take 500-1,000 mg by mouth every 6 (six) hours as needed (pain).    11/03/2016 at Unknown time  . aspirin 81 MG tablet Take 1 tablet (81 mg total) by mouth daily. 30 tablet 3 11/03/2016 at Unknown time  . calcium acetate (PHOSLO) 667 MG capsule Take 1,334-2,001 mg by mouth See admin instructions. Take 2001mg  three times daily with meals and Take 1334mg   with snacks.   11/02/2016 at Unknown time  . dorzolamide-timolol (COSOPT) 22.3-6.8 MG/ML ophthalmic solution Place 1 drop into the left eye 2 (two) times daily.  12 Past Week at Unknown time  . glucose blood (ONE TOUCH ULTRA TEST) test strip 1 each by  Other route daily. And lancets 1/day 100 each 3 11/02/2016 at Unknown time  . lidocaine-prilocaine (EMLA) cream Apply 1 application topically as needed (topical anesthesia for hemodialysis if Gebauers and Lidocaine injection are ineffective.). 30 g 0 11/02/2016 at Unknown time  . metoprolol tartrate (LOPRESSOR) 50 MG tablet Take 1 tablet (50 mg total) by mouth every Tuesday, Thursday, Saturday, and Sunday. 30 tablet 0 11/02/2016 at 4p  . repaglinide (PRANDIN) 0.5 MG tablet TAKE 1 TABLET BY MOUTH THREE TIMES DAILY BEFORE MEALS 90 tablet 3 11/02/2016 at Unknown time  . simvastatin (ZOCOR) 10 MG tablet Take 1 tablet (10 mg total) by mouth every evening. 90 tablet 3 11/02/2016 at Unknown time  . Fluticasone-Salmeterol (ADVAIR) 250-50 MCG/DOSE AEPB Inhale 1 puff into the lungs daily as needed (shortness of breath).    Not Taking at Unknown time  . nitroGLYCERIN (NITROSTAT) 0.4 MG SL tablet Place 1 tablet (0.4 mg total) under the tongue every 5 (five) minutes as needed for  chest pain. 30 tablet 3 unk  . pantoprazole (PROTONIX) 40 MG tablet Take 1 tablet (40 mg total) by mouth daily before breakfast. (Patient not taking: Reported on 11/03/2016) 30 tablet 11 Not Taking at Unknown time  . sucralfate (CARAFATE) 1 GM/10ML suspension Take 10 mLs (1 g total) by mouth 4 (four) times daily -  with meals and at bedtime. (Patient not taking: Reported on 11/03/2016) 420 mL 0 Not Taking at Unknown time   Assessment: [name] is a 71 y.o. female with ESRD presenting to the ED with chest pain and positive troponin. No anticoagulation documented prior to admit. Given extensive disease, cardiology does not think she is a candidate for revascularization. Will treat medically for 48 hours (IV heparin started 10/27). HgB stable from yesterday, trending upward. Platelets stable (148).   Heparin level subtherapeutic this morning at 0.19, on heparin 1100 units/hr. No interruptions to the heparin infusion. No signs/symptoms of bleeding.   Goal of Therapy:  Heparin level 0.3-0.7 units/ml Monitor platelets by anticoagulation protocol: Yes   Plan:  1. Increase heparin infusion to 1250 units/hr 2. Obtain heparin level 6 hrs after start pending if heparin infusion to be discontinued 3. Daily HL, CBC as needed 4. Monitor signs/symptoms of bleeding   Doylene Canard, PharmD Clinical Pharmacist  Pager: [phone] Clinical Phone for 11/06/2016 until 3:30pm: [phone] If after 3:30pm, please call main pharmacy at [phone]  ADDENDUM Heparin infusion discontinued after 48 hours.   Doylene Canard, PharmD Clinical Pharmacist

## 2016-11-08 ENCOUNTER — Telehealth: Payer: Self-pay | Admitting: *Deleted

## 2016-11-08 DIAGNOSIS — E1122 Type 2 diabetes mellitus with diabetic chronic kidney disease: Secondary | ICD-10-CM | POA: Diagnosis not present

## 2016-11-08 DIAGNOSIS — D509 Iron deficiency anemia, unspecified: Secondary | ICD-10-CM | POA: Diagnosis not present

## 2016-11-08 DIAGNOSIS — E119 Type 2 diabetes mellitus without complications: Secondary | ICD-10-CM | POA: Diagnosis not present

## 2016-11-08 DIAGNOSIS — D631 Anemia in chronic kidney disease: Secondary | ICD-10-CM | POA: Diagnosis not present

## 2016-11-08 DIAGNOSIS — Z992 Dependence on renal dialysis: Secondary | ICD-10-CM | POA: Diagnosis not present

## 2016-11-08 DIAGNOSIS — N2581 Secondary hyperparathyroidism of renal origin: Secondary | ICD-10-CM | POA: Diagnosis not present

## 2016-11-08 DIAGNOSIS — N186 End stage renal disease: Secondary | ICD-10-CM | POA: Diagnosis not present

## 2016-11-08 LAB — CULTURE, BLOOD (ROUTINE X 2)
CULTURE: NO GROWTH
Special Requests: ADEQUATE

## 2016-11-08 NOTE — Telephone Encounter (Signed)
Transition Care Management Follow-up Telephone Call   Date discharged? 11/06/16   How have you been since you were released from the hospital? Called pt spoke w/daughter [name]) she states mom was at dialysis.. But she was able to set-up appt. She states mom is during alright   Do you understand why you were in the hospital? YES   Do you understand the discharge instructions? YES   Where were you discharged to? Home   Items Reviewed:  Medications reviewed: YES  Allergies reviewed: YES  Dietary changes reviewed: YES  Referrals reviewed: No referral needed   Functional Questionnaire:   Activities of Daily Living (ADLs):   She states mom are independent in the following: bathing and hygiene, feeding, continence, grooming, toileting and dressing States mom require assistance with the following: ambulation sometimes uses cane   Any transportation issues/concerns?: NO   Any patient concerns? NO   Confirmed importance and date/time of follow-up visits scheduled YES, appt 11/16/16  Provider Appointment booked with Dr. Alain Marion  Confirmed with patient if condition begins to worsen call PCP or go to the ER.  Patient was given the office number and encouraged to call back with question or concerns.  : YES

## 2016-11-10 DIAGNOSIS — N2581 Secondary hyperparathyroidism of renal origin: Secondary | ICD-10-CM | POA: Diagnosis not present

## 2016-11-10 DIAGNOSIS — N186 End stage renal disease: Secondary | ICD-10-CM | POA: Diagnosis not present

## 2016-11-10 DIAGNOSIS — D509 Iron deficiency anemia, unspecified: Secondary | ICD-10-CM | POA: Diagnosis not present

## 2016-11-10 DIAGNOSIS — E119 Type 2 diabetes mellitus without complications: Secondary | ICD-10-CM | POA: Diagnosis not present

## 2016-11-10 DIAGNOSIS — D631 Anemia in chronic kidney disease: Secondary | ICD-10-CM | POA: Diagnosis not present

## 2016-11-10 LAB — CULTURE, BLOOD (ROUTINE X 2)
CULTURE: NO GROWTH
Culture: NO GROWTH
SPECIAL REQUESTS: ADEQUATE
Special Requests: ADEQUATE

## 2016-11-13 DIAGNOSIS — D631 Anemia in chronic kidney disease: Secondary | ICD-10-CM | POA: Diagnosis not present

## 2016-11-13 DIAGNOSIS — D509 Iron deficiency anemia, unspecified: Secondary | ICD-10-CM | POA: Diagnosis not present

## 2016-11-13 DIAGNOSIS — N186 End stage renal disease: Secondary | ICD-10-CM | POA: Diagnosis not present

## 2016-11-13 DIAGNOSIS — N2581 Secondary hyperparathyroidism of renal origin: Secondary | ICD-10-CM | POA: Diagnosis not present

## 2016-11-13 DIAGNOSIS — E119 Type 2 diabetes mellitus without complications: Secondary | ICD-10-CM | POA: Diagnosis not present

## 2016-11-15 DIAGNOSIS — D631 Anemia in chronic kidney disease: Secondary | ICD-10-CM | POA: Diagnosis not present

## 2016-11-15 DIAGNOSIS — D509 Iron deficiency anemia, unspecified: Secondary | ICD-10-CM | POA: Diagnosis not present

## 2016-11-15 DIAGNOSIS — N186 End stage renal disease: Secondary | ICD-10-CM | POA: Diagnosis not present

## 2016-11-15 DIAGNOSIS — N2581 Secondary hyperparathyroidism of renal origin: Secondary | ICD-10-CM | POA: Diagnosis not present

## 2016-11-15 DIAGNOSIS — E119 Type 2 diabetes mellitus without complications: Secondary | ICD-10-CM | POA: Diagnosis not present

## 2016-11-16 ENCOUNTER — Inpatient Hospital Stay: Payer: Medicare Other | Admitting: Internal Medicine

## 2016-11-17 DIAGNOSIS — E119 Type 2 diabetes mellitus without complications: Secondary | ICD-10-CM | POA: Diagnosis not present

## 2016-11-17 DIAGNOSIS — N186 End stage renal disease: Secondary | ICD-10-CM | POA: Diagnosis not present

## 2016-11-17 DIAGNOSIS — D631 Anemia in chronic kidney disease: Secondary | ICD-10-CM | POA: Diagnosis not present

## 2016-11-17 DIAGNOSIS — N2581 Secondary hyperparathyroidism of renal origin: Secondary | ICD-10-CM | POA: Diagnosis not present

## 2016-11-17 DIAGNOSIS — D509 Iron deficiency anemia, unspecified: Secondary | ICD-10-CM | POA: Diagnosis not present

## 2016-11-20 DIAGNOSIS — E119 Type 2 diabetes mellitus without complications: Secondary | ICD-10-CM | POA: Diagnosis not present

## 2016-11-20 DIAGNOSIS — N186 End stage renal disease: Secondary | ICD-10-CM | POA: Diagnosis not present

## 2016-11-20 DIAGNOSIS — N2581 Secondary hyperparathyroidism of renal origin: Secondary | ICD-10-CM | POA: Diagnosis not present

## 2016-11-20 DIAGNOSIS — D631 Anemia in chronic kidney disease: Secondary | ICD-10-CM | POA: Diagnosis not present

## 2016-11-20 DIAGNOSIS — D509 Iron deficiency anemia, unspecified: Secondary | ICD-10-CM | POA: Diagnosis not present

## 2016-11-22 DIAGNOSIS — N186 End stage renal disease: Secondary | ICD-10-CM | POA: Diagnosis not present

## 2016-11-22 DIAGNOSIS — N2581 Secondary hyperparathyroidism of renal origin: Secondary | ICD-10-CM | POA: Diagnosis not present

## 2016-11-22 DIAGNOSIS — D509 Iron deficiency anemia, unspecified: Secondary | ICD-10-CM | POA: Diagnosis not present

## 2016-11-22 DIAGNOSIS — E119 Type 2 diabetes mellitus without complications: Secondary | ICD-10-CM | POA: Diagnosis not present

## 2016-11-22 DIAGNOSIS — D631 Anemia in chronic kidney disease: Secondary | ICD-10-CM | POA: Diagnosis not present

## 2016-11-24 DIAGNOSIS — D631 Anemia in chronic kidney disease: Secondary | ICD-10-CM | POA: Diagnosis not present

## 2016-11-24 DIAGNOSIS — E119 Type 2 diabetes mellitus without complications: Secondary | ICD-10-CM | POA: Diagnosis not present

## 2016-11-24 DIAGNOSIS — N186 End stage renal disease: Secondary | ICD-10-CM | POA: Diagnosis not present

## 2016-11-24 DIAGNOSIS — D509 Iron deficiency anemia, unspecified: Secondary | ICD-10-CM | POA: Diagnosis not present

## 2016-11-24 DIAGNOSIS — N2581 Secondary hyperparathyroidism of renal origin: Secondary | ICD-10-CM | POA: Diagnosis not present

## 2016-11-26 DIAGNOSIS — N186 End stage renal disease: Secondary | ICD-10-CM | POA: Diagnosis not present

## 2016-11-26 DIAGNOSIS — N2581 Secondary hyperparathyroidism of renal origin: Secondary | ICD-10-CM | POA: Diagnosis not present

## 2016-11-26 DIAGNOSIS — D631 Anemia in chronic kidney disease: Secondary | ICD-10-CM | POA: Diagnosis not present

## 2016-11-26 DIAGNOSIS — E119 Type 2 diabetes mellitus without complications: Secondary | ICD-10-CM | POA: Diagnosis not present

## 2016-11-26 DIAGNOSIS — D509 Iron deficiency anemia, unspecified: Secondary | ICD-10-CM | POA: Diagnosis not present

## 2016-11-28 ENCOUNTER — Ambulatory Visit: Payer: Medicare Other | Admitting: Cardiology

## 2016-11-28 DIAGNOSIS — N2581 Secondary hyperparathyroidism of renal origin: Secondary | ICD-10-CM | POA: Diagnosis not present

## 2016-11-28 DIAGNOSIS — E119 Type 2 diabetes mellitus without complications: Secondary | ICD-10-CM | POA: Diagnosis not present

## 2016-11-28 DIAGNOSIS — D631 Anemia in chronic kidney disease: Secondary | ICD-10-CM | POA: Diagnosis not present

## 2016-11-28 DIAGNOSIS — D509 Iron deficiency anemia, unspecified: Secondary | ICD-10-CM | POA: Diagnosis not present

## 2016-11-28 DIAGNOSIS — N186 End stage renal disease: Secondary | ICD-10-CM | POA: Diagnosis not present

## 2016-12-01 DIAGNOSIS — N2581 Secondary hyperparathyroidism of renal origin: Secondary | ICD-10-CM | POA: Diagnosis not present

## 2016-12-01 DIAGNOSIS — N186 End stage renal disease: Secondary | ICD-10-CM | POA: Diagnosis not present

## 2016-12-01 DIAGNOSIS — D631 Anemia in chronic kidney disease: Secondary | ICD-10-CM | POA: Diagnosis not present

## 2016-12-01 DIAGNOSIS — D509 Iron deficiency anemia, unspecified: Secondary | ICD-10-CM | POA: Diagnosis not present

## 2016-12-01 DIAGNOSIS — E119 Type 2 diabetes mellitus without complications: Secondary | ICD-10-CM | POA: Diagnosis not present

## 2016-12-04 DIAGNOSIS — D631 Anemia in chronic kidney disease: Secondary | ICD-10-CM | POA: Diagnosis not present

## 2016-12-04 DIAGNOSIS — D509 Iron deficiency anemia, unspecified: Secondary | ICD-10-CM | POA: Diagnosis not present

## 2016-12-04 DIAGNOSIS — E119 Type 2 diabetes mellitus without complications: Secondary | ICD-10-CM | POA: Diagnosis not present

## 2016-12-04 DIAGNOSIS — N2581 Secondary hyperparathyroidism of renal origin: Secondary | ICD-10-CM | POA: Diagnosis not present

## 2016-12-04 DIAGNOSIS — N186 End stage renal disease: Secondary | ICD-10-CM | POA: Diagnosis not present

## 2016-12-05 ENCOUNTER — Encounter: Payer: Self-pay | Admitting: Cardiology

## 2016-12-05 ENCOUNTER — Ambulatory Visit (INDEPENDENT_AMBULATORY_CARE_PROVIDER_SITE_OTHER): Payer: Medicare Other | Admitting: Cardiology

## 2016-12-05 VITALS — BP 110/60 | HR 82 | Ht 60.0 in | Wt 162.0 lb

## 2016-12-05 DIAGNOSIS — I2510 Atherosclerotic heart disease of native coronary artery without angina pectoris: Secondary | ICD-10-CM

## 2016-12-05 DIAGNOSIS — I255 Ischemic cardiomyopathy: Secondary | ICD-10-CM | POA: Diagnosis not present

## 2016-12-05 DIAGNOSIS — I471 Supraventricular tachycardia: Secondary | ICD-10-CM

## 2016-12-05 NOTE — Patient Instructions (Signed)
Medication Instructions:  Your physician recommends that you continue on your current medications as directed. Please refer to the Current Medication list given to you today.  If you need a refill on your cardiac medications before your next appointment, please call your pharmacy.   Labwork: None ordered  Testing/Procedures: None ordered  Follow-Up: Your physician wants you to follow-up in: 6 months  with Dr. Camnitz.  You will receive a reminder letter in the mail two months in advance. If you don't receive a letter, please call our office to schedule the follow-up appointment.  Thank you for choosing [facility]!!   Harel Repetto, RN [phone]  Any Other Special Instructions Will Be Listed Below (If Applicable).        

## 2016-12-05 NOTE — Progress Notes (Signed)
Electrophysiology Office Note   Date:  12/05/2016   ID:  Cynthia, Walls 10-18-1945, MRN [id number]  PCP:  Cassandria Anger, MD  Primary Electrophysiologist: Caryl Comes    Chief Complaint  Patient presents with  . Follow-up    Atrial Tachycardia     History of Present Illness: [name] is a 71 y.o. female who presents today for electrophysiology evaluation.   history of ESRF on HD, HTN, DM, LBBB, PAT on amiodarone, CAD, admitted 10/16with acute hypoxic respiratory failure in the setting of non-STEMI. She required urgent dialysis. Follow-up echocardiogram demonstrated worsening LV function with an EF of 35-40%. Cardiac catheterization demonstrated severe multivessel disease with diffuse calcified LAD and distal LAD disease in conjunction with new findings in the proximal D1 and mid RCA. Sheunderwent PCI with a DES to the D1. Unsuccessful attempt was made at PCI of the proximal RCA.  Was seen in clinic previously for dyspnea and found to be in AT vs ST. Amiodarone was increased to 200 mg BID. Had atrial tachycardia ablation 11/17/15 which was located in the low lateral RA.  Today, denies symptoms of palpitations, chest pain, shortness of breath, orthopnea, PND, lower extremity edema, claudication, dizziness, presyncope, syncope, bleeding, or neurologic sequela. The patient is tolerating medications without difficulties.  Is admitted to the hospital in August 2018 after an episode of syncope which felt to be vasovagal.  Ejection fraction was found to be 25-30%.  She also had an end STEMI that was thought to be due to demand.  Today she feels well without complaint.   Past Medical History:  Diagnosis Date  . Acute respiratory failure with hypoxia ([place]) 10/23/2014  . Adenomatous duodenal polyp   . Anemia   . Arthritis    "hands" (11/17/2015)  . Atrial tachycardia ([place])    on amiod  . Cardiomyopathy- mixed   . CHF (congestive heart failure) ([place])   . Chronic diastolic heart  failure ([place])   . Colon cancer ([place]) 1986  . Colostomy in place [facility])   . Coronary artery disease    Previously decreased EF; echo 113 normal LV function  . Dialysis patient ([place])   . ESRD (end stage renal disease) on dialysis [facility])    Dr Dunham/Dr. Lyda Kalata.  M, W, Fr; [place] (11/17/2015)  . GERD (gastroesophageal reflux disease)   . Glaucoma   . Heart murmur   . Hernia, incisional    abd  . Hyperlipidemia   . Hypertension   . Hypertensive heart disease    sees Dr. Alain Marion  . LBBB (left bundle branch block)   . Mitral valve insufficiency and aortic valve insufficiency   . Obesity   . Stroke [facility])    2011/12  . Type II diabetes mellitus ([place])    Past Surgical History:  Procedure Laterality Date  . ARTERIOVENOUS GRAFT PLACEMENT Left 2010   "forearm"  . BALLOON DILATION N/A 08/17/2016   Procedure: POSSIBLE BALLOON DILATION POSSIBLE MALONEY;  Surgeon: Gatha Mayer, MD;  Location: [facility] ENDOSCOPY;  Service: Endoscopy;  Laterality: N/A;  . CARDIAC CATHETERIZATION    . CARDIAC CATHETERIZATION N/A 10/27/2014   Procedure: Left Heart Cath and Coronary Angiography;  Surgeon: Leonie Man, MD;  Location: [place] CV LAB;  Service: Cardiovascular;  Laterality: N/A;  . CARDIAC CATHETERIZATION N/A 10/28/2014   Procedure: Coronary Stent Intervention;  Surgeon: Peter M Martinique, MD;  Location: [place] CV LAB;  Service: Cardiovascular;  Laterality: N/A;  . CARDIAC CATHETERIZATION Bilateral 12/2008  R. heart cath showed elevated left and right heart filling pressures w/ pulmonary artery pressure elevated mildly out of proportion to the wedge. The left heart cath showed diffuse distal vessel disease as well as a 75% stenosis in the mid circumflex w/ a 90% stenosis of the ostial first obtuse marginal. These lesions were in close proximity. there was a 60-70%mild RCA stenosis.   [name] CATARACT EXTRACTION W/ INTRAOCULAR LENS  IMPLANT, BILATERAL Bilateral   . COLON SURGERY    . COLONOSCOPY      . COLOSTOMY  1986  . ELECTROCARDIOGRAM  04-27-06  . ELECTROPHYSIOLOGIC STUDY N/A 11/17/2015   Procedure: A-Tach Ablation;  Surgeon: Robyn Nohr Meredith Leeds, MD;  Location: [place] CV LAB;  Service: Cardiovascular;  Laterality: N/A;  . ESOPHAGOGASTRODUODENOSCOPY  03-18-04  . ESOPHAGOGASTRODUODENOSCOPY N/A 02/02/2013   Procedure: ESOPHAGOGASTRODUODENOSCOPY (EGD);  Surgeon: Irene Shipper, MD;  Location: [facility] ENDOSCOPY;  Service: Endoscopy;  Laterality: N/A;  . ESOPHAGOGASTRODUODENOSCOPY (EGD) WITH PROPOFOL N/A 08/17/2016   Procedure: ESOPHAGOGASTRODUODENOSCOPY (EGD) WITH PROPOFOL;  Surgeon: Gatha Mayer, MD;  Location: [facility];  Service: Endoscopy;  Laterality: N/A;   NO XRAY NEEDED  . EYE SURGERY    . FOOT AMPUTATION THROUGH METATARSAL  10-07-10   Right foot transmetatarsal  . INSERTION OF AHMED VALVE Right 08/20/2013   Procedure: INSERTION OF AHMED VALVE WITH Mitomycin C application;  Surgeon: Marylynn Pearson, MD;  Location: [place];  Service: Ophthalmology;  Laterality: Right;  . INSERTION OF AHMED VALVE Left 07/22/2014   Procedure: INSERTION OF AHMED VALVE WITH [place];  Surgeon: Marylynn Pearson, MD;  Location: [place];  Service: Ophthalmology;  Laterality: Left;  . LEFT HEART CATH AND CORONARY ANGIOGRAPHY N/A 08/18/2016   Procedure: LEFT HEART CATH AND CORONARY ANGIOGRAPHY;  Surgeon: Martinique, Peter M, MD;  Location: [place] CV LAB;  Service: Cardiovascular;  Laterality: N/A;  . MITOMYCIN C APPLICATION Left 1/61/0960   Procedure: MITOMYCIN C APPLICATION;  Surgeon: Marylynn Pearson, MD;  Location: [place];  Service: Ophthalmology;  Laterality: Left;  . PARS PLANA VITRECTOMY  11/29/2011   Procedure: PARS PLANA VITRECTOMY WITH 23 GAUGE;  Surgeon: Adonis Brook, MD;  Location: [place];  Service: Ophthalmology;  Laterality: Right;  Right Eye 23 ga vitrectomy with membrane peel  . PARS PLANA VITRECTOMY Left 02/28/2012   Procedure: PARS PLANA VITRECTOMY WITH 23 GAUGE;  Surgeon: Adonis Brook, MD;  Location: [place];  Service: Ophthalmology;  Laterality: Left;  . REVISION UROSTOMY CUTANEOUS    . SUPRAVENTRICULAR TACHYCARDIA ABLATION  11/17/2015  . VAGINAL HYSTERECTOMY       Current Outpatient Medications  Medication Sig Dispense Refill  . acetaminophen (TYLENOL) 500 MG tablet Take 500-1,000 mg by mouth every 6 (six) hours as needed (pain).     [name] aspirin 81 MG tablet Take 1 tablet (81 mg total) by mouth daily. 30 tablet 3  . calcium acetate (PHOSLO) 667 MG capsule Take 1,334-2,001 mg by mouth See admin instructions. Take 2001mg  three times daily with meals and Take 1334mg   with snacks.    . dorzolamide-timolol (COSOPT) 22.3-6.8 MG/ML ophthalmic solution Place 1 drop into the left eye 2 (two) times daily.  12  . Fluticasone-Salmeterol (ADVAIR) 250-50 MCG/DOSE AEPB Inhale 1 puff into the lungs daily as needed (shortness of breath).     [name] glucose blood (ONE TOUCH ULTRA TEST) test strip 1 each by Other route daily. And lancets 1/day 100 each 3  . lidocaine-prilocaine (EMLA) cream Apply 1 application topically as needed (topical anesthesia  for hemodialysis if Gebauers and Lidocaine injection are ineffective.). 30 g 0  . metoprolol tartrate (LOPRESSOR) 50 MG tablet Take 1 tablet (50 mg total) by mouth every Tuesday, Thursday, Saturday, and Sunday. 30 tablet 0  . nitroGLYCERIN (NITROSTAT) 0.4 MG SL tablet Place 1 tablet (0.4 mg total) under the tongue every 5 (five) minutes as needed for chest pain. 30 tablet 3  . pantoprazole (PROTONIX) 40 MG tablet Take 1 tablet (40 mg total) by mouth daily before breakfast. 30 tablet 11  . simvastatin (ZOCOR) 10 MG tablet Take 1 tablet (10 mg total) by mouth every evening. 90 tablet 3   No current facility-administered medications for this visit.    Facility-Administered Medications Ordered in Other Visits  Medication Dose Route Frequency Provider Last Rate Last Dose  . mitoMYcin (MUTAMYCIN) Injection Use in OR only (0.4 mg/ml)  0.5 mL Left Eye Once Marylynn Pearson, MD         Allergies:   Ace inhibitors; Eggs or egg-derived products; Enalapril; Lisinopril; and Omnipaque [iohexol]   Social History:  The patient  reports that  has never smoked. she has never used smokeless tobacco. She reports that she does not drink alcohol or use drugs.   Family History:  The patient's family history includes Cancer in her sister; Coronary artery disease in her mother; Diabetes in her father; Hypertension in her father, mother, and other.    ROS:  Please see the history of present illness.   Otherwise, review of systems is positive for DOE, appetite change, balance problems.   All other systems are reviewed and negative.   PHYSICAL EXAM: VS:  BP 110/60   Pulse 82   Ht 5' (1.524 m)   Wt 162 lb (73.5 kg)   BMI 31.64 kg/m  , BMI Body mass index is 31.64 kg/m. GEN: Well nourished, well developed, in no acute distress  HEENT: normal  Neck: no JVD, carotid bruits, or masses Cardiac: RRR; no murmurs, rubs, or gallops,no edema  Respiratory:  clear to auscultation bilaterally, normal work of breathing GI: soft, nontender, nondistended, + BS MS: no deformity or atrophy  Skin: warm and dry Neuro:  Strength and sensation are intact Psych: euthymic mood, full affect  EKG:  EKG is not ordered today. Personal review of the ekg ordered 11/04/16 shows sinus tachycardia, rate 119   Recent Labs: 05/23/2016: TSH 1.010 07/23/2016: B Natriuretic Peptide >4,500.0 11/03/2016: ALT 10 11/06/2016: BUN 22; Creatinine, Ser 4.11; Hemoglobin 12.3; Magnesium 1.9; Platelets 148; Potassium 4.2; Sodium 137    Lipid Panel     Component Value Date/Time   CHOL 117 08/18/2016 0639   TRIG 64 08/18/2016 0639   HDL 39 (L) 08/18/2016 0639   CHOLHDL 3.0 08/18/2016 0639   VLDL 13 08/18/2016 0639   LDLCALC 65 08/18/2016 0639     Wt Readings from Last 3 Encounters:  12/05/16 162 lb (73.5 kg)  11/06/16 168 lb 10.4 oz (76.5 kg)  10/12/16 165 lb (74.8 kg)      Other studies  Reviewed: Additional studies/ records that were reviewed today include: TTE 11/01/16 Review of the above records today demonstrates:  - Left ventricle: The cavity size was mildly dilated. Systolic   function was severely reduced. The estimated ejection fraction   was in the range of 25% to 30%. Wall motion was normal; there   were no regional wall motion abnormalities. Doppler parameters   are consistent with restrictive physiology, indicative of   decreased left ventricular diastolic compliance and/or increased  left atrial pressure. Doppler parameters are consistent with   elevated ventricular end-diastolic filling pressure. - Ventricular septum: Septal motion showed paradox. - Aortic valve: There was mild stenosis. There was moderate   regurgitation. - Mitral valve: Calcified annulus. Severely thickened, severely   calcified leaflets . Mobility was restricted. The findings are   consistent with moderate to severe stenosis. There was moderate   regurgitation. Mean gradient (D): 10 mm Hg. - Left atrium: The atrium was severely dilated. - Right ventricle: The cavity size was mildly dilated. Wall   thickness was normal. Systolic function was moderately reduced. - Right atrium: The atrium was moderately dilated. - Tricuspid valve: There was moderate regurgitation. - Pulmonary arteries: Systolic pressure was moderately to severely   increased. PA peak pressure: 65 mm Hg (S). - Inferior vena cava: The vessel was dilated. The respirophasic   diameter changes were blunted (< 50%), consistent with elevated   central venous pressure.   ASSESSMENT AND PLAN:  1.  Atrial tachycardia: Post ablation 11/17/15 in the low lateral right atrium.  Maintaining sinus rhythm.  No changes.    2. DOE: Likely multifactorial and long-lasting.  Possibly due to diastolic heart failure.  Has recently been put on dialysis as well.  No changes.  3. CAD: no current chest pain.  Continue current therapy.  4.  Cardiomyopathy: Currently on beta-blockers.  Dialysis limits further medical management.    Current medicines are reviewed at length with the patient today.   The patient does not have concerns regarding her medicines.  The following changes were made today:  none  Labs/ tests ordered today include:  No orders of the defined types were placed in this encounter.    Disposition:   FU with [name] 6 months    Signed, Emmy Keng Meredith Leeds, MD  12/05/2016 2:30 PM     [address] [place] [address] [phone] (office) [phone] (fax)

## 2016-12-06 DIAGNOSIS — D631 Anemia in chronic kidney disease: Secondary | ICD-10-CM | POA: Diagnosis not present

## 2016-12-06 DIAGNOSIS — D509 Iron deficiency anemia, unspecified: Secondary | ICD-10-CM | POA: Diagnosis not present

## 2016-12-06 DIAGNOSIS — N186 End stage renal disease: Secondary | ICD-10-CM | POA: Diagnosis not present

## 2016-12-06 DIAGNOSIS — N2581 Secondary hyperparathyroidism of renal origin: Secondary | ICD-10-CM | POA: Diagnosis not present

## 2016-12-06 DIAGNOSIS — E119 Type 2 diabetes mellitus without complications: Secondary | ICD-10-CM | POA: Diagnosis not present

## 2016-12-08 DIAGNOSIS — E1122 Type 2 diabetes mellitus with diabetic chronic kidney disease: Secondary | ICD-10-CM | POA: Diagnosis not present

## 2016-12-08 DIAGNOSIS — N2581 Secondary hyperparathyroidism of renal origin: Secondary | ICD-10-CM | POA: Diagnosis not present

## 2016-12-08 DIAGNOSIS — E119 Type 2 diabetes mellitus without complications: Secondary | ICD-10-CM | POA: Diagnosis not present

## 2016-12-08 DIAGNOSIS — D509 Iron deficiency anemia, unspecified: Secondary | ICD-10-CM | POA: Diagnosis not present

## 2016-12-08 DIAGNOSIS — N186 End stage renal disease: Secondary | ICD-10-CM | POA: Diagnosis not present

## 2016-12-08 DIAGNOSIS — Z992 Dependence on renal dialysis: Secondary | ICD-10-CM | POA: Diagnosis not present

## 2016-12-08 DIAGNOSIS — D631 Anemia in chronic kidney disease: Secondary | ICD-10-CM | POA: Diagnosis not present

## 2016-12-11 ENCOUNTER — Ambulatory Visit: Payer: Medicare Other | Admitting: Family

## 2016-12-11 DIAGNOSIS — N2581 Secondary hyperparathyroidism of renal origin: Secondary | ICD-10-CM | POA: Diagnosis not present

## 2016-12-11 DIAGNOSIS — D631 Anemia in chronic kidney disease: Secondary | ICD-10-CM | POA: Diagnosis not present

## 2016-12-11 DIAGNOSIS — E119 Type 2 diabetes mellitus without complications: Secondary | ICD-10-CM | POA: Diagnosis not present

## 2016-12-11 DIAGNOSIS — N186 End stage renal disease: Secondary | ICD-10-CM | POA: Diagnosis not present

## 2016-12-11 DIAGNOSIS — D509 Iron deficiency anemia, unspecified: Secondary | ICD-10-CM | POA: Diagnosis not present

## 2016-12-13 DIAGNOSIS — E119 Type 2 diabetes mellitus without complications: Secondary | ICD-10-CM | POA: Diagnosis not present

## 2016-12-13 DIAGNOSIS — N186 End stage renal disease: Secondary | ICD-10-CM | POA: Diagnosis not present

## 2016-12-13 DIAGNOSIS — D631 Anemia in chronic kidney disease: Secondary | ICD-10-CM | POA: Diagnosis not present

## 2016-12-13 DIAGNOSIS — D509 Iron deficiency anemia, unspecified: Secondary | ICD-10-CM | POA: Diagnosis not present

## 2016-12-13 DIAGNOSIS — N2581 Secondary hyperparathyroidism of renal origin: Secondary | ICD-10-CM | POA: Diagnosis not present

## 2016-12-14 ENCOUNTER — Telehealth: Payer: Self-pay | Admitting: Endocrinology

## 2016-12-14 NOTE — Telephone Encounter (Signed)
Patient is due for a pair of orthopedic shoes. Please fill out the paperwork for [employer]. She gets one pair a year. If questions please call patient.

## 2016-12-15 DIAGNOSIS — D509 Iron deficiency anemia, unspecified: Secondary | ICD-10-CM | POA: Diagnosis not present

## 2016-12-15 DIAGNOSIS — N2581 Secondary hyperparathyroidism of renal origin: Secondary | ICD-10-CM | POA: Diagnosis not present

## 2016-12-15 DIAGNOSIS — D631 Anemia in chronic kidney disease: Secondary | ICD-10-CM | POA: Diagnosis not present

## 2016-12-15 DIAGNOSIS — E119 Type 2 diabetes mellitus without complications: Secondary | ICD-10-CM | POA: Diagnosis not present

## 2016-12-15 DIAGNOSIS — N186 End stage renal disease: Secondary | ICD-10-CM | POA: Diagnosis not present

## 2016-12-15 NOTE — Telephone Encounter (Signed)
I called patient & stated that we had not yet received any paperwork from [employer]. When we did I would give it to Dr. Loanne Drilling to review. Patient also stated that she would call back to make an appt.

## 2016-12-20 DIAGNOSIS — N2581 Secondary hyperparathyroidism of renal origin: Secondary | ICD-10-CM | POA: Diagnosis not present

## 2016-12-20 DIAGNOSIS — N179 Acute kidney failure, unspecified: Secondary | ICD-10-CM | POA: Diagnosis not present

## 2016-12-20 DIAGNOSIS — Z0000 Encounter for general adult medical examination without abnormal findings: Secondary | ICD-10-CM | POA: Diagnosis not present

## 2016-12-20 DIAGNOSIS — D631 Anemia in chronic kidney disease: Secondary | ICD-10-CM | POA: Diagnosis not present

## 2016-12-20 DIAGNOSIS — N186 End stage renal disease: Secondary | ICD-10-CM | POA: Diagnosis not present

## 2016-12-20 DIAGNOSIS — D509 Iron deficiency anemia, unspecified: Secondary | ICD-10-CM | POA: Diagnosis not present

## 2016-12-20 DIAGNOSIS — I10 Essential (primary) hypertension: Secondary | ICD-10-CM | POA: Diagnosis not present

## 2016-12-20 DIAGNOSIS — E119 Type 2 diabetes mellitus without complications: Secondary | ICD-10-CM | POA: Diagnosis not present

## 2016-12-22 DIAGNOSIS — D631 Anemia in chronic kidney disease: Secondary | ICD-10-CM | POA: Diagnosis not present

## 2016-12-22 DIAGNOSIS — E119 Type 2 diabetes mellitus without complications: Secondary | ICD-10-CM | POA: Diagnosis not present

## 2016-12-22 DIAGNOSIS — D509 Iron deficiency anemia, unspecified: Secondary | ICD-10-CM | POA: Diagnosis not present

## 2016-12-22 DIAGNOSIS — N2581 Secondary hyperparathyroidism of renal origin: Secondary | ICD-10-CM | POA: Diagnosis not present

## 2016-12-22 DIAGNOSIS — N186 End stage renal disease: Secondary | ICD-10-CM | POA: Diagnosis not present

## 2016-12-25 DIAGNOSIS — N186 End stage renal disease: Secondary | ICD-10-CM | POA: Diagnosis not present

## 2016-12-25 DIAGNOSIS — D509 Iron deficiency anemia, unspecified: Secondary | ICD-10-CM | POA: Diagnosis not present

## 2016-12-25 DIAGNOSIS — N2581 Secondary hyperparathyroidism of renal origin: Secondary | ICD-10-CM | POA: Diagnosis not present

## 2016-12-25 DIAGNOSIS — E119 Type 2 diabetes mellitus without complications: Secondary | ICD-10-CM | POA: Diagnosis not present

## 2016-12-25 DIAGNOSIS — D631 Anemia in chronic kidney disease: Secondary | ICD-10-CM | POA: Diagnosis not present

## 2016-12-27 DIAGNOSIS — D631 Anemia in chronic kidney disease: Secondary | ICD-10-CM | POA: Diagnosis not present

## 2016-12-27 DIAGNOSIS — N2581 Secondary hyperparathyroidism of renal origin: Secondary | ICD-10-CM | POA: Diagnosis not present

## 2016-12-27 DIAGNOSIS — D509 Iron deficiency anemia, unspecified: Secondary | ICD-10-CM | POA: Diagnosis not present

## 2016-12-27 DIAGNOSIS — E119 Type 2 diabetes mellitus without complications: Secondary | ICD-10-CM | POA: Diagnosis not present

## 2016-12-27 DIAGNOSIS — N186 End stage renal disease: Secondary | ICD-10-CM | POA: Diagnosis not present

## 2016-12-28 DIAGNOSIS — L97512 Non-pressure chronic ulcer of other part of right foot with fat layer exposed: Secondary | ICD-10-CM | POA: Diagnosis not present

## 2016-12-29 DIAGNOSIS — E119 Type 2 diabetes mellitus without complications: Secondary | ICD-10-CM | POA: Diagnosis not present

## 2016-12-29 DIAGNOSIS — N186 End stage renal disease: Secondary | ICD-10-CM | POA: Diagnosis not present

## 2016-12-29 DIAGNOSIS — D509 Iron deficiency anemia, unspecified: Secondary | ICD-10-CM | POA: Diagnosis not present

## 2016-12-29 DIAGNOSIS — D631 Anemia in chronic kidney disease: Secondary | ICD-10-CM | POA: Diagnosis not present

## 2016-12-29 DIAGNOSIS — N2581 Secondary hyperparathyroidism of renal origin: Secondary | ICD-10-CM | POA: Diagnosis not present

## 2016-12-31 DIAGNOSIS — N186 End stage renal disease: Secondary | ICD-10-CM | POA: Diagnosis not present

## 2016-12-31 DIAGNOSIS — N2581 Secondary hyperparathyroidism of renal origin: Secondary | ICD-10-CM | POA: Diagnosis not present

## 2016-12-31 DIAGNOSIS — D509 Iron deficiency anemia, unspecified: Secondary | ICD-10-CM | POA: Diagnosis not present

## 2016-12-31 DIAGNOSIS — E119 Type 2 diabetes mellitus without complications: Secondary | ICD-10-CM | POA: Diagnosis not present

## 2016-12-31 DIAGNOSIS — D631 Anemia in chronic kidney disease: Secondary | ICD-10-CM | POA: Diagnosis not present

## 2017-01-03 DIAGNOSIS — D631 Anemia in chronic kidney disease: Secondary | ICD-10-CM | POA: Diagnosis not present

## 2017-01-03 DIAGNOSIS — N2581 Secondary hyperparathyroidism of renal origin: Secondary | ICD-10-CM | POA: Diagnosis not present

## 2017-01-03 DIAGNOSIS — N186 End stage renal disease: Secondary | ICD-10-CM | POA: Diagnosis not present

## 2017-01-03 DIAGNOSIS — E119 Type 2 diabetes mellitus without complications: Secondary | ICD-10-CM | POA: Diagnosis not present

## 2017-01-03 DIAGNOSIS — D509 Iron deficiency anemia, unspecified: Secondary | ICD-10-CM | POA: Diagnosis not present

## 2017-01-05 DIAGNOSIS — N2581 Secondary hyperparathyroidism of renal origin: Secondary | ICD-10-CM | POA: Diagnosis not present

## 2017-01-05 DIAGNOSIS — N186 End stage renal disease: Secondary | ICD-10-CM | POA: Diagnosis not present

## 2017-01-05 DIAGNOSIS — D631 Anemia in chronic kidney disease: Secondary | ICD-10-CM | POA: Diagnosis not present

## 2017-01-05 DIAGNOSIS — E119 Type 2 diabetes mellitus without complications: Secondary | ICD-10-CM | POA: Diagnosis not present

## 2017-01-05 DIAGNOSIS — D509 Iron deficiency anemia, unspecified: Secondary | ICD-10-CM | POA: Diagnosis not present

## 2017-01-07 ENCOUNTER — Emergency Department (HOSPITAL_COMMUNITY)
Admission: EM | Admit: 2017-01-07 | Discharge: 2017-01-07 | Disposition: A | Discharge status: Home / Self Care | Payer: Medicare Other | Source: Home / Self Care | Attending: Emergency Medicine | Admitting: Emergency Medicine

## 2017-01-07 ENCOUNTER — Emergency Department (HOSPITAL_BASED_OUTPATIENT_CLINIC_OR_DEPARTMENT_OTHER): Admit: 2017-01-07 | Discharge: 2017-01-07 | Disposition: A | Discharge status: Home / Self Care | Payer: Medicare Other

## 2017-01-07 ENCOUNTER — Other Ambulatory Visit: Payer: Self-pay

## 2017-01-07 ENCOUNTER — Emergency Department (HOSPITAL_COMMUNITY): Payer: Medicare Other

## 2017-01-07 DIAGNOSIS — I132 Hypertensive heart and chronic kidney disease with heart failure and with stage 5 chronic kidney disease, or end stage renal disease: Secondary | ICD-10-CM

## 2017-01-07 DIAGNOSIS — R911 Solitary pulmonary nodule: Secondary | ICD-10-CM

## 2017-01-07 DIAGNOSIS — E119 Type 2 diabetes mellitus without complications: Secondary | ICD-10-CM | POA: Diagnosis not present

## 2017-01-07 DIAGNOSIS — I509 Heart failure, unspecified: Secondary | ICD-10-CM

## 2017-01-07 DIAGNOSIS — N2581 Secondary hyperparathyroidism of renal origin: Secondary | ICD-10-CM | POA: Diagnosis not present

## 2017-01-07 DIAGNOSIS — R109 Unspecified abdominal pain: Secondary | ICD-10-CM | POA: Diagnosis not present

## 2017-01-07 DIAGNOSIS — L03115 Cellulitis of right lower limb: Secondary | ICD-10-CM

## 2017-01-07 DIAGNOSIS — N186 End stage renal disease: Secondary | ICD-10-CM | POA: Insufficient documentation

## 2017-01-07 DIAGNOSIS — R5383 Other fatigue: Secondary | ICD-10-CM | POA: Diagnosis not present

## 2017-01-07 DIAGNOSIS — L0390 Cellulitis, unspecified: Secondary | ICD-10-CM

## 2017-01-07 DIAGNOSIS — K8020 Calculus of gallbladder without cholecystitis without obstruction: Secondary | ICD-10-CM | POA: Diagnosis not present

## 2017-01-07 DIAGNOSIS — I2510 Atherosclerotic heart disease of native coronary artery without angina pectoris: Secondary | ICD-10-CM | POA: Insufficient documentation

## 2017-01-07 DIAGNOSIS — M7989 Other specified soft tissue disorders: Secondary | ICD-10-CM

## 2017-01-07 DIAGNOSIS — E876 Hypokalemia: Secondary | ICD-10-CM | POA: Insufficient documentation

## 2017-01-07 DIAGNOSIS — I5042 Chronic combined systolic (congestive) and diastolic (congestive) heart failure: Secondary | ICD-10-CM | POA: Diagnosis not present

## 2017-01-07 DIAGNOSIS — Z79899 Other long term (current) drug therapy: Secondary | ICD-10-CM

## 2017-01-07 DIAGNOSIS — D509 Iron deficiency anemia, unspecified: Secondary | ICD-10-CM | POA: Diagnosis not present

## 2017-01-07 DIAGNOSIS — K920 Hematemesis: Secondary | ICD-10-CM | POA: Diagnosis not present

## 2017-01-07 DIAGNOSIS — L97519 Non-pressure chronic ulcer of other part of right foot with unspecified severity: Secondary | ICD-10-CM | POA: Insufficient documentation

## 2017-01-07 DIAGNOSIS — Z992 Dependence on renal dialysis: Secondary | ICD-10-CM

## 2017-01-07 DIAGNOSIS — E1122 Type 2 diabetes mellitus with diabetic chronic kidney disease: Secondary | ICD-10-CM

## 2017-01-07 DIAGNOSIS — R531 Weakness: Secondary | ICD-10-CM | POA: Diagnosis not present

## 2017-01-07 DIAGNOSIS — Z7982 Long term (current) use of aspirin: Secondary | ICD-10-CM | POA: Insufficient documentation

## 2017-01-07 DIAGNOSIS — R112 Nausea with vomiting, unspecified: Secondary | ICD-10-CM | POA: Diagnosis not present

## 2017-01-07 DIAGNOSIS — D631 Anemia in chronic kidney disease: Secondary | ICD-10-CM | POA: Diagnosis not present

## 2017-01-07 DIAGNOSIS — A419 Sepsis, unspecified organism: Secondary | ICD-10-CM | POA: Diagnosis not present

## 2017-01-07 DIAGNOSIS — N12 Tubulo-interstitial nephritis, not specified as acute or chronic: Secondary | ICD-10-CM | POA: Diagnosis not present

## 2017-01-07 LAB — COMPREHENSIVE METABOLIC PANEL
ALT: 10 U/L — AB (ref 14–54)
AST: 17 U/L (ref 15–41)
Albumin: 2.6 g/dL — ABNORMAL LOW (ref 3.5–5.0)
Alkaline Phosphatase: 140 U/L — ABNORMAL HIGH (ref 38–126)
Anion gap: 14 (ref 5–15)
BUN: 24 mg/dL — AB (ref 6–20)
CHLORIDE: 97 mmol/L — AB (ref 101–111)
CO2: 26 mmol/L (ref 22–32)
CREATININE: 3.82 mg/dL — AB (ref 0.44–1.00)
Calcium: 8.4 mg/dL — ABNORMAL LOW (ref 8.9–10.3)
GFR calc Af Amer: 13 mL/min — ABNORMAL LOW (ref >60)
GFR calc non Af Amer: 11 mL/min — ABNORMAL LOW (ref >60)
GLUCOSE: 155 mg/dL — AB (ref 65–99)
Potassium: 3.1 mmol/L — ABNORMAL LOW (ref 3.5–5.1)
SODIUM: 137 mmol/L (ref 135–145)
Total Bilirubin: 1.4 mg/dL — ABNORMAL HIGH (ref 0.3–1.2)
Total Protein: 6.5 g/dL (ref 6.5–8.1)

## 2017-01-07 LAB — CBC
HCT: 38 % (ref 36.0–46.0)
Hemoglobin: 12.2 g/dL (ref 12.0–15.0)
MCH: 27.9 pg (ref 26.0–34.0)
MCHC: 32.1 g/dL (ref 30.0–36.0)
MCV: 86.8 fL (ref 78.0–100.0)
PLATELETS: 207 10*3/uL (ref 150–400)
RBC: 4.38 MIL/uL (ref 3.87–5.11)
RDW: 16.7 % — ABNORMAL HIGH (ref 11.5–15.5)
WBC: 14.5 10*3/uL — AB (ref 4.0–10.5)

## 2017-01-07 LAB — PROTIME-INR
INR: 1.23
Prothrombin Time: 15.4 seconds — ABNORMAL HIGH (ref 11.4–15.2)

## 2017-01-07 LAB — TYPE AND SCREEN
ABO/RH(D): AB POS
Antibody Screen: NEGATIVE

## 2017-01-07 LAB — POC OCCULT BLOOD, ED: FECAL OCCULT BLD: POSITIVE — AB

## 2017-01-07 MED ORDER — PIPERACILLIN-TAZOBACTAM 3.375 G IVPB 30 MIN
3.3750 g | Freq: Once | INTRAVENOUS | Status: AC
  Administered 2017-01-07: 3.375 g via INTRAVENOUS
  Filled 2017-01-07: qty 50

## 2017-01-07 MED ORDER — SODIUM CHLORIDE 0.9 % IV BOLUS (SEPSIS)
500.0000 mL | Freq: Once | INTRAVENOUS | Status: AC
  Administered 2017-01-07: 500 mL via INTRAVENOUS

## 2017-01-07 MED ORDER — SULFAMETHOXAZOLE-TRIMETHOPRIM 800-160 MG PO TABS: 1.0000 | ORAL_TABLET | Freq: Two times a day (BID) | ORAL | 0 refills | Status: DC

## 2017-01-07 MED ORDER — AMOXICILLIN-POT CLAVULANATE 875-125 MG PO TABS: 1.0000 | ORAL_TABLET | Freq: Two times a day (BID) | ORAL | 0 refills | Status: DC

## 2017-01-07 MED ORDER — VANCOMYCIN HCL 10 G IV SOLR
1500.0000 mg | Freq: Once | INTRAVENOUS | Status: AC
  Administered 2017-01-07: 1500 mg via INTRAVENOUS
  Filled 2017-01-07: qty 1500

## 2017-01-07 MED ORDER — SULFAMETHOXAZOLE-TRIMETHOPRIM 400-80 MG PO TABS: ORAL_TABLET | ORAL | 0 refills | Status: DC

## 2017-01-07 MED ORDER — FENTANYL CITRATE (PF) 100 MCG/2ML IJ SOLN
50.0000 ug | Freq: Once | INTRAMUSCULAR | Status: DC
  Filled 2017-01-07: qty 2

## 2017-01-07 MED ORDER — FENTANYL CITRATE (PF) 100 MCG/2ML IJ SOLN
50.0000 ug | Freq: Once | INTRAMUSCULAR | Status: AC
  Administered 2017-01-07: 50 ug via INTRAVENOUS

## 2017-01-07 MED ORDER — SULFAMETHOXAZOLE-TRIMETHOPRIM 400-80 MG PO TABS: 1.0000 | ORAL_TABLET | Freq: Every day | ORAL | 0 refills | Status: DC

## 2017-01-07 MED ORDER — SULFAMETHOXAZOLE-TRIMETHOPRIM 800-160 MG PO TABS: 1.0000 | ORAL_TABLET | Freq: Every day | ORAL | 0 refills | Status: DC

## 2017-01-07 MED ORDER — SODIUM CHLORIDE 0.9 % IV BOLUS (SEPSIS): 500.0000 mL | Freq: Once | INTRAVENOUS | Status: DC

## 2017-01-07 NOTE — ED Notes (Signed)
Pt has urostomy bag and makes very little urine occasionally. Unable to obtain sample

## 2017-01-07 NOTE — ED Provider Notes (Signed)
The patient is a 71 year old female she is a diabetic, she has had a right distal foot amputation and has since developed an ulcer on the bottom of her foot.  She also has a history of end-stage renal disease on dialysis and while she was at dialysis today she developed 2 or 3 episodes of vomiting, initially it was just some clear liquids and subsequently she developed some brighter red blood.  Since then the nausea has resolved, there is no abdominal pain and she has not had any further hematemesis.  She denies coughing or hemoptysis.  There has been no fevers or chills but the patient has had some increasing pain around the ulcer on the bottom of her right foot.  She has been treated by the specialist with Silvadene cream however she has had some progressive warmth of the foot, slight swelling of the leg (usually wears compression stockings but is not currently wearing them) and has had some redness around the wound as well.  She denies any foul smell or drainage.  On exam she does not fact have 1+ pitting edema to the right, no edema on the left lower extremity, she has an open wound ulcer to the bottom of the left foot with some exposed plantar fascia, she has tenderness surrounding this area as well as warmth of the foot and the ankle.  She is not tachycardic and her blood pressure is 387 systolic.  The patient likely has at the minimum an infection around this area though that has not necessarily caused her hematemesis.  She will need an antibiotic at the minimum.  She has had recent evaluation for hematemesis and was found not to have any varices.  That is reassuring and that I doubt that the patient has an active hemorrhage that would need inpatient management or GI consultation emergently.  She will need some more observation in the emergency department to make sure it does not recur, we will start antibiotics and we will obtain labs to further evaluate.  Xray Labs Antibiotics  Medical screening  examination/treatment/procedure(s) were conducted as a shared visit with non-physician practitioner(s) and myself.  I personally evaluated the patient during the encounter.  Clinical Impression:   Final diagnoses:  Hematemesis, presence of nausea not specified  Right foot ulcer, with unspecified severity ([place])  Cellulitis, unspecified cellulitis site  Lung nodule  Hypokalemia         Noemi Chapel, MD 01/15/17 2126

## 2017-01-07 NOTE — ED Provider Notes (Signed)
[facility] EMERGENCY DEPARTMENT Provider Note   CSN: [id number] Arrival date & time: 01/07/17  1353     History   Chief Complaint Chief Complaint  Patient presents with  . Hematemesis    HPI [name] is a 71 y.o. female.  HPI  Patient is a 71 year old female with a history of ESRD on dialysis MWF, colon CA s/p colostomy, urostomy, and partial right foot amputation who presents to the ED after she had 2 episodes of hematemesis while at dialysis PTA.  Patient had dialysis today due to upcoming holiday, she had been on dialysis for about 1 hour before before she had the 2 episodes.  She noted particles in the emesis, but is unable to describe what the particles looked like.  She has not had any additional episodes of vomiting since.  Initially patient was complaining of left mid back pain that she began while she was at dialysis today.  Describes this as a dull pain that is intermittent.  She states that this is now resolved.  She denies any current nausea, abdominal pain, chest pain, new shortness of breath (has chronic SOB at baseline).  She denies any recent documented fevers, URI symptoms, changes in the color of her urine, urinary odor, hematuria, blood in her stool.  Patient's daughter does report that she has an ulcer to the plantar aspect of the right foot that was noted about 1 month ago.  She was evaluated by her doctor for this and was referred to vascular service.  Appointment is scheduled in 4 days.  Daughter has also noticed color change around the ulcer and notes that patient has been complaining of increased pain for the last week.  No gross purulent drainage has been noted, but has had increased swelling to the foot and right calf.    Past Medical History:  Diagnosis Date  . Acute respiratory failure with hypoxia ([place]) 10/23/2014  . Adenomatous duodenal polyp   . Anemia   . Arthritis    "hands" (11/17/2015)  . Atrial tachycardia ([place])    on  amiod  . Cardiomyopathy- mixed   . CHF (congestive heart failure) ([place])   . Chronic diastolic heart failure ([place])   . Colon cancer ([place]) 1986  . Colostomy in place [facility])   . Coronary artery disease    Previously decreased EF; echo 113 normal LV function  . Dialysis patient ([place])   . ESRD (end stage renal disease) on dialysis [facility])    Dr Dunham/Dr. Lyda Kalata.  M, W, Fr; [place] (11/17/2015)  . GERD (gastroesophageal reflux disease)   . Glaucoma   . Heart murmur   . Hernia, incisional    abd  . Hyperlipidemia   . Hypertension   . Hypertensive heart disease    sees Dr. Alain Marion  . LBBB (left bundle branch block)   . Mitral valve insufficiency and aortic valve insufficiency   . Obesity   . Stroke [facility])    2011/12  . Type II diabetes mellitus [facility])     Patient Active Problem List   Diagnosis Date Noted  . Syncope 11/03/2016  . PEA (Pulseless electrical activity) ([place])   . Chronic systolic CHF (congestive heart failure) ([place])   . Postprocedural hypotension   . End-stage renal disease on hemodialysis ([place])   . Cardiac arrest ([place]) 08/17/2016  . Erosive esophagitis   . Esophageal dysphagia   . Hematemesis with nausea   . Gastritis and gastroduodenitis   . Hard of  hearing 04/25/2016  . GERD (gastroesophageal reflux disease) 02/08/2016  . Loss of weight 01/18/2016  . Atrial tachycardia ([place]) 11/17/2015  . Diabetes ([place]) 05/13/2015  . Ischemic cardiomyopathy   . Non-ST elevated myocardial infarction (non-STEMI) ([place])   . Dyspnea on exertion 10/23/2014  . Elevated troponin 10/23/2014  . Congestive heart failure (CHF) ([place]) 10/23/2014  . GI bleed 02/02/2013  . Chest pain 02/01/2013  . S/P transmetatarsal amputation of foot ([place]) 01/31/2013  . History of colon cancer   . Obesity   . ESRD on dialysis ([place]) 05/28/2012  . Nausea 05/21/2011  . Anemia 05/21/2011  . Coronary artery disease   . CVA (cerebral infarction) 06/01/2010  . Acute on chronic systolic and diastolic  heart failure, NYHA class 1 ([place]) 01/27/2009  . Atrial tachycardia    . LBBB (left bundle branch block) 04/10/2008  . Hyperlipidemia   . Hypertensive heart disease     Past Surgical History:  Procedure Laterality Date  . ARTERIOVENOUS GRAFT PLACEMENT Left 2010   "forearm"  . BALLOON DILATION N/A 08/17/2016   Procedure: POSSIBLE BALLOON DILATION POSSIBLE MALONEY;  Surgeon: Gatha Mayer, MD;  [facility]: [facility] END[facility];  Service: Endoscopy;  Laterality: N/A;  . CARDIAC CATHETERIZATION    . CARDIAC CATHETERIZATION N/A 10/27/2014   Procedure: Left Heart Cath and Coronary Angiography;  Surgeon: Leonie Man, MD;  Location: [facility] CV LAB;  Service: Cardiovascular;  Laterality: N/A;  . CARDIAC CATHETERIZATION N/A 10/28/2014   Procedure: Coronary Stent Intervention;  Surgeon: Peter M Martinique, MD;  Location: [facility] CV LAB;  Service: Cardiovascular;  Laterality: N/A;  . CARDIAC CATHETERIZATION Bilateral 12/2008   R. heart cath showed elevated left and right heart filling pressures w/ pulmonary artery pressure elevated mildly out of proportion to the wedge. The left heart cath showed diffuse distal vessel disease as well as a 75% stenosis in the mid circumflex w/ a 90% stenosis of the ostial first obtuse marginal. These lesions were in close proximity. there was a 60-70%mild RCA stenosis.   [name] CATARACT EXTRACTION W/ INTRAOCULAR LENS  IMPLANT, BILATERAL Bilateral   . COLON SURGERY    . COLONOSCOPY    . COLOSTOMY  1986  . ELECTROCARDIOGRAM  04-27-06  . ELECTROPHYSIOLOGIC STUDY N/A 11/17/2015   Procedure: A-Tach Ablation;  Surgeon: Will Meredith Leeds, MD;  Location: [facility] CV LAB;  Service: Cardiovascular;  Laterality: N/A;  . ESOPHAGOGASTRODUODENOSCOPY  03-18-04  . ESOPHAGOGASTRODUODENOSCOPY N/A 02/02/2013   Procedure: ESOPHAGOGASTRODUODENOSCOPY (EGD);  Surgeon: Irene Shipper, MD;  Location: [facility] ENDOSCOPY;  Service: Endoscopy;  Laterality: N/A;  . ESOPHAGOGASTRODUODENOSCOPY (EGD) WITH  PROPOFOL N/A 08/17/2016   Procedure: ESOPHAGOGASTRODUODENOSCOPY (EGD) WITH PROPOFOL;  Surgeon: Gatha Mayer, MD;  [facility];  Service: Endoscopy;  Laterality: N/A;   NO XRAY NEEDED  . EYE SURGERY    . FOOT AMPUTATION THROUGH METATARSAL  10-07-10   Right foot transmetatarsal  . INSERTION OF AHMED VALVE Right 08/20/2013   Procedure: INSERTION OF AHMED VALVE WITH Mitomycin C application;  Surgeon: Marylynn Pearson, MD;  Location: [facility];  Service: Ophthalmology;  Laterality: Right;  . INSERTION OF AHMED VALVE Left 07/22/2014   Procedure: INSERTION OF AHMED VALVE WITH [place];  Surgeon: Marylynn Pearson, MD;  Location: [facility];  Service: Ophthalmology;  Laterality: Left;  . LEFT HEART CATH AND CORONARY ANGIOGRAPHY N/A 08/18/2016   Procedure: LEFT HEART CATH AND CORONARY ANGIOGRAPHY;  Surgeon: Martinique, Peter M, MD;  Location: [facility] CV LAB;  Service: Cardiovascular;  Laterality:  N/A;  . MITOMYCIN C APPLICATION Left 5/72/6203   Procedure: MITOMYCIN C APPLICATION;  Surgeon: Marylynn Pearson, MD;  Location: [facility];  Service: Ophthalmology;  Laterality: Left;  . PARS PLANA VITRECTOMY  11/29/2011   Procedure: PARS PLANA VITRECTOMY WITH 23 GAUGE;  Surgeon: Adonis Brook, MD;  Location: [facility];  Service: Ophthalmology;  Laterality: Right;  Right Eye 23 ga vitrectomy with membrane peel  . PARS PLANA VITRECTOMY Left 02/28/2012   Procedure: PARS PLANA VITRECTOMY WITH 23 GAUGE;  Surgeon: Adonis Brook, MD;  Location: [facility];  Service: Ophthalmology;  Laterality: Left;  . REVISION UROSTOMY CUTANEOUS    . SUPRAVENTRICULAR TACHYCARDIA ABLATION  11/17/2015  . VAGINAL HYSTERECTOMY      OB History    No data available       Home Medications    Prior to Admission medications   Medication Sig Start Date End Date Taking? Authorizing Provider  acetaminophen (TYLENOL) 500 MG tablet Take 500-1,000 mg by mouth every 6 (six) hours as needed (pain).    Yes [provider]  albuterol (PROVENTIL  HFA;VENTOLIN HFA) 108 (90 Base) MCG/ACT inhaler Inhale 1-2 puffs into the lungs every 6 (six) hours as needed for wheezing or shortness of breath.   Yes [provider]  aspirin 81 MG tablet Take 1 tablet (81 mg total) by mouth daily. 10/29/14  Yes Dhungel, Nishant, MD  calcium acetate (PHOSLO) 667 MG capsule Take 1,334-2,001 mg by mouth See admin instructions. Take 2001mg  three times daily with meals and Take 1334mg   with snacks.   Yes [provider]  Calcium Carbonate Antacid (ALKA-SELTZER ANTACID PO) Take 2 tablets by mouth daily.   Yes [provider]  dorzolamide-timolol (COSOPT) 22.3-6.8 MG/ML ophthalmic solution Place 1 drop into the left eye daily.  11/09/15  Yes [provider]  lidocaine-prilocaine (EMLA) cream Apply 1 application topically as needed (topical anesthesia for hemodialysis if Gebauers and Lidocaine injection are ineffective.). 08/20/16  Yes Allie Bossier, MD  metoprolol tartrate (LOPRESSOR) 50 MG tablet Take 1 tablet (50 mg total) by mouth every Tuesday, Thursday, Saturday, and Sunday. 08/20/16  Yes Allie Bossier, MD  silver sulfADIAZINE (SILVADENE) 1 % cream Apply 1 application topically daily. Right foot 12/29/16  Yes [provider]  simvastatin (ZOCOR) 10 MG tablet Take 1 tablet (10 mg total) by mouth every evening. 05/23/16  Yes Camnitz, Will Hassell Done, MD  amoxicillin-clavulanate (AUGMENTIN) 875-125 MG tablet Take 1 tablet by mouth 2 (two) times daily for 14 days. 01/07/17 01/21/17  Sicily Zaragoza S, PA-C  glucose blood (ONE TOUCH ULTRA TEST) test strip 1 each by Other route daily. And lancets 1/day 03/16/16   Renato Shin, MD  nitroGLYCERIN (NITROSTAT) 0.4 MG SL tablet Place 1 tablet (0.4 mg total) under the tongue every 5 (five) minutes as needed for chest pain. 10/29/14   Dhungel, Flonnie Overman, MD  pantoprazole (PROTONIX) 40 MG tablet Take 1 tablet (40 mg total) by mouth daily before breakfast. Patient not taking: Reported on  01/07/2017 07/25/16   Gatha Mayer, MD  sulfamethoxazole-trimethoprim (BACTRIM) 400-80 MG tablet Please take 2 tablets tomorrow on 12/31.  Then continue to take 1 table per day for the next 13 days.  On dialysis days, take the tablet after dialysis. 01/07/17   Phoebie Shad S, PA-C    Family History Family History  Problem Relation Age of Onset  . Hypertension Mother   . Coronary artery disease Mother   . Hypertension Father   . Diabetes Father   .  Cancer Sister        colon  . Hypertension Other     Social History Social History   Tobacco Use  . Smoking status: Never Smoker  . Smokeless tobacco: Never Used  Substance Use Topics  . Alcohol use: No  . Drug use: No     Allergies   Ace inhibitors; Eggs or egg-derived products; Enalapril; Lisinopril; and Omnipaque [iohexol]   Review of Systems Review of Systems  Constitutional: Negative for chills and fever.  HENT: Negative for congestion, ear pain, rhinorrhea and sore throat.        No epistaxis of bleeding gums  Eyes: Negative for pain and visual disturbance.  Respiratory: Positive for shortness of breath (chronic). Negative for cough and wheezing.   Cardiovascular: Positive for leg swelling (chronic). Negative for chest pain and palpitations.  Gastrointestinal: Negative for abdominal pain, blood in stool, constipation, diarrhea, nausea and vomiting.  Genitourinary: Negative for flank pain and hematuria.       No odor or discoloration to urine  Musculoskeletal: Negative for arthralgias and back pain.  Skin: Negative for rash.       Ulcer to the bottom of right foot, with increased swelling, pain, and warmth  Neurological: Negative for dizziness, seizures, syncope, speech difficulty, weakness, light-headedness and numbness.  All other systems reviewed and are negative.    Physical Exam Updated Vital Signs BP 114/78   Pulse 96   Temp 97.6 F (36.4 C) (Oral)   Resp 16   Wt 74.8 kg (165 lb)   SpO2 98%   BMI  32.22 kg/m   Physical Exam  Constitutional: She appears well-developed and well-nourished. No distress.  HENT:  Head: Normocephalic and atraumatic.  Eyes: Conjunctivae are normal.  Neck: Neck supple.  Cardiovascular: Normal rate and regular rhythm.  No murmur heard. Pulmonary/Chest: Effort normal and breath sounds normal. No respiratory distress.  Abdominal: Soft. There is no tenderness.  Musculoskeletal: She exhibits no edema.  Patient has a 1.5 cm ulcer to the plantar aspect of the right foot.  There is darkened discoloration of the skin surrounding the area.  No gross purulence or blood is noted.  Right lower extremity is warm to touch compared to left.  Neurological: She is alert.  Skin: Skin is warm and dry.  Psychiatric: She has a normal mood and affect.  Nursing note and vitals reviewed.    ED Treatments / Results  Labs (all labs ordered are listed, but only abnormal results are displayed) Labs Reviewed  CBC - Abnormal; Notable for the following components:      Result Value   WBC 14.5 (*)    RDW 16.7 (*)    All other components within normal limits  COMPREHENSIVE METABOLIC PANEL - Abnormal; Notable for the following components:   Potassium 3.1 (*)    Chloride 97 (*)    Glucose, Bld 155 (*)    BUN 24 (*)    Creatinine, Ser 3.82 (*)    Calcium 8.4 (*)    Albumin 2.6 (*)    ALT 10 (*)    Alkaline Phosphatase 140 (*)    Total Bilirubin 1.4 (*)    GFR calc non Af Amer 11 (*)    GFR calc Af Amer 13 (*)    All other components within normal limits  PROTIME-INR - Abnormal; Notable for the following components:   Prothrombin Time 15.4 (*)    All other components within normal limits  POC OCCULT BLOOD, ED - Abnormal; Notable  for the following components:   Fecal Occult Bld POSITIVE (*)    All other components within normal limits  OCCULT BLOOD X 1 CARD TO LAB, STOOL  URINALYSIS, ROUTINE W REFLEX MICROSCOPIC  TYPE AND SCREEN    EKG  EKG  Interpretation  Date/Time:  Sunday January 07 2017 20:03:05 EST Ventricular Rate:  84 PR Interval:    QRS Duration: 157 QT Interval:  476 QTC Calculation: 563 R Axis:   140 Text Interpretation:  Sinus rhythm Atrial premature complex Borderline prolonged PR interval Probable left atrial enlargement Nonspecific intraventricular conduction delay Consider anterior infarct since last tracing no significant change Confirmed by Noemi Chapel [phone]) on 01/07/2017 8:09:24 PM       Radiology Dg Chest Port 1 View  Result Date: 01/07/2017 CLINICAL DATA:  Hematemesis. EXAM: PORTABLE CHEST 1 VIEW COMPARISON:  11/03/2016 FINDINGS: 1503 hours. The cardio pericardial silhouette is enlarged. The lungs are clear without focal pneumonia, edema, pneumothorax or pleural effusion. Pulmonary nodule superimposed on the posterior left seventh rib was not visualized on the prior exam. The visualized bony structures of the thorax are intact. IMPRESSION: Cardiomegaly without edema or focal airspace consolidation. Small nodule in the left mid lung not seen on the prior study. CT chest without contrast recommended to further evaluate. Electronically Signed   By: Misty Stanley M.D.   On: 01/07/2017 15:28   Dg Foot 2 Views Right  Result Date: 01/07/2017 CLINICAL DATA:  Right foot ulcer at the second metatarsal. EXAM: RIGHT FOOT - 2 VIEW COMPARISON:  October 04, 2010 FINDINGS: Patient is status post partial amputation of the mid to distal right foot. The bony stumps demonstrate no evidence of osteopenia, bone destruction to suggest osteomyelitis. IMPRESSION: Status post prior amputation of the mid to distal right foot. No evidence of osteomyelitis. Electronically Signed   By: Abelardo Diesel M.D.   On: 01/07/2017 19:42    Procedures Procedures (including critical care time)  Medications Ordered in ED Medications  sodium chloride 0.9 % bolus 500 mL (0 mLs Intravenous Stopped 01/07/17 1908)  piperacillin-tazobactam  (ZOSYN) IVPB 3.375 g (0 g Intravenous Stopped 01/07/17 1908)  vancomycin (VANCOCIN) 1,500 mg in sodium chloride 0.9 % 500 mL IVPB (0 mg Intravenous Stopped 01/07/17 2217)  fentaNYL (SUBLIMAZE) injection 50 mcg (50 mcg Intravenous Given 01/07/17 1909)     Initial Impression / Assessment and Plan / ED Course  I have reviewed the triage vital signs and the nursing notes.  Pertinent labs & imaging results that were available during my care of the patient were reviewed by me and considered in my medical decision making (see chart for details).    On review of notes, EGD on 08/17/16 results are as follows, "Findings: Diffuse moderate inflammation characterized by congestion (edema) and erythema was found in the gastric antrum. A single 10 mm sessile polyp was found in the second portion of the duodenum. The polyp was removed with a hot snare. Resection and retrieval were complete. Verification of patient identification for the specimen was done. Estimated blood loss: none. The exam was otherwise without abnormality. The cardia and gastric fundus were normal on retroflexion."  Rechecked patient.  She denies any symptoms currently.  She has had no further episodes of hematemesis while in the ED.  Vital signs are stable at this time and she is alert and oriented.  She states that her right foot pain has improved after medication.  Advised her to take Tylenol at home for her foot pain. I discussed  the labs, EKG, imaging results with the patient and her daughter.  Discussed the plan for discharge home on antibiotics.  Advised to follow-up with vascular as already scheduled in 4 days.  Advised her to follow-up with her primary doctor within 3 days for reevaluation.  Return precautions given.  All questions were answered and patient and her daughter understand the plan.  01/08/17, 9:15am: Attempted to contact pt to inform her and/or her daughter of changes to prescription of Augmentin. No answer to all 3 contact  numbers provided. Voicemail was left.  01/08/17, 9:30am: Contacted pts pharmacy currently listed in [place], [place] on [address] [place], [place], and spoke with [place]. Cancelled Rx Augmentin 875-125mg . Updated Rx to be given is Augmentin 500mg  daily for 14 days.   Final Clinical Impressions(s) / ED Diagnoses   Final diagnoses:  Hematemesis, presence of nausea not specified  Right foot ulcer, with unspecified severity (HCC)  Cellulitis, unspecified cellulitis site  Lung nodule  Hypokalemia   71 year old female with a history of ESRD on dialysis MWF, colon CA s/p colostomy, urostomy, and partial right foot amputation presented to the ED after 2 episodes of hematemesis while at dialysis PTA.  Patient has had no further episodes of vomiting or hematemesis while in the ED.  Initially hypotensive but improved after fluids and vital signs have been stable in the ED. Pt has remained afebrile. Her hemoglobin is stable. Hematemesis likely the result of inflammation found in gastric antrum on EGD 4 months ago. Low concern for significant ongoing bleeding, and Low concern for varices or PUD. Stable for outpatient f/u.  Patient also complained of right foot pain and lower extremity swelling worsening over the last week.  Diabetic ulcer to right foot.  X-ray negative for osteomyelitis.  Ultrasound negative for DVT.  Symptoms likely secondary to cellulitis.  Will treat as outpatient with antibiotics.  Strict return precautions given and follow-up recommended within 3 days.  ED Discharge Orders        Ordered    sulfamethoxazole-trimethoprim (BACTRIM DS,SEPTRA DS) 800-160 MG tablet  2 times daily,   Status:  Discontinued     01/07/17 2022    amoxicillin-clavulanate (AUGMENTIN) 875-125 MG tablet  2 times daily,   Status:  Discontinued     01/07/17 2022    sulfamethoxazole-trimethoprim (BACTRIM DS,SEPTRA DS) 800-160 MG tablet  Daily,   Status:  Discontinued     01/07/17 2052     amoxicillin-clavulanate (AUGMENTIN) 875-125 MG tablet  2 times daily     01/07/17 2053    sulfamethoxazole-trimethoprim (BACTRIM) 400-80 MG tablet  Daily,   Status:  Discontinued     01/07/17 2058    sulfamethoxazole-trimethoprim (BACTRIM) 400-80 MG tablet     01/07/17 2059       Rodney Booze, PA-C 01/08/17 0058    Rodney Booze, PA-C 01/08/17 0951    Noemi Chapel, MD 01/15/17 2126

## 2017-01-07 NOTE — ED Notes (Signed)
ORDERED A FULL LIQUID/RENAL DIET FOR PT PER RN

## 2017-01-07 NOTE — ED Provider Notes (Addendum)
  Patient placed in Quick Look pathway, seen and evaluated for chief complaint of hematemesis.  Pertinent H&P findings include hypotension.  Based on initial evaluation, labs are indicated and radiology studies are indicated.  Patient counseled on process, plan, and necessity for staying for completing the evaluation.  Patient at dialysis when she became nauseated and vomited twice with a small amount of red emesis in the emesis basin.  EMS reports that on their arrival her blood pressure was systolic of 80.  Otherwise appeared awake and alert without significant complaints.  Reports that she has not had any similar episodes in the past.  Daughter reports significant weight loss recently.  She denies any black or bloody stooling.  Left-sided flank pain but no abdominal pain.  Pattricia Boss, MD 01/07/17 1440    Pattricia Boss, MD 01/07/17 [phone]

## 2017-01-07 NOTE — ED Triage Notes (Signed)
Patient from dialysis after vomiting blood x2. EMS reports they did not see the emesis but were told by staff that "it was enough to cover the bottom of a basin both times". MWF dialysis, being treated today due to holiday. Alert and oriented at this time.

## 2017-01-07 NOTE — Progress Notes (Signed)
VASCULAR LAB PRELIMINARY  PRELIMINARY  PRELIMINARY  PRELIMINARY  Right lower extremity venous duplex completed.    Preliminary report:  There is no DVT or SVT noted in the visualized veins of the right lower extremity. Enlarged inguinal lymph nodes noted.  Interstitial fluid noted throughout.  Gave report to Dr. Delice Lesch.  Shalece Staffa, RVT 01/07/2017, 6:56 PM

## 2017-01-07 NOTE — Discharge Instructions (Addendum)
You were given a prescription for Bactrim and Augmentin to be taken for the next 14 days. Please take 2 tablets of the Bactrim tomorrow on 12/31.  Then continue to take 1 tablet/day of Bactrim for the next 13 days.  On dialysis days, please take this medicine after dialysis.  Please take the Augmentin 2 times daily for the next 14 days starting tomorrow 12/31.  You must follow up with your primary doctor within 3 days and have them review all of your results from your hospital stay.  Please follow-up with vascular as scheduled.  Return to the ER for any new or worsening symptoms.

## 2017-01-08 ENCOUNTER — Encounter (HOSPITAL_COMMUNITY): Payer: Self-pay | Admitting: Physician Assistant

## 2017-01-08 DIAGNOSIS — Z992 Dependence on renal dialysis: Secondary | ICD-10-CM | POA: Diagnosis not present

## 2017-01-08 DIAGNOSIS — N186 End stage renal disease: Secondary | ICD-10-CM | POA: Diagnosis not present

## 2017-01-09 DIAGNOSIS — Z992 Dependence on renal dialysis: Secondary | ICD-10-CM | POA: Diagnosis not present

## 2017-01-09 DIAGNOSIS — N186 End stage renal disease: Secondary | ICD-10-CM | POA: Diagnosis not present

## 2017-01-09 DIAGNOSIS — E1129 Type 2 diabetes mellitus with other diabetic kidney complication: Secondary | ICD-10-CM | POA: Diagnosis not present

## 2017-01-10 ENCOUNTER — Encounter (HOSPITAL_COMMUNITY): Payer: Self-pay | Admitting: Emergency Medicine

## 2017-01-10 ENCOUNTER — Other Ambulatory Visit: Payer: Self-pay

## 2017-01-10 ENCOUNTER — Emergency Department (HOSPITAL_COMMUNITY): Payer: Medicare Other

## 2017-01-10 ENCOUNTER — Inpatient Hospital Stay (HOSPITAL_COMMUNITY)
Admission: EM | Admit: 2017-01-10 | Discharge: 2017-01-13 | DRG: 871 | Disposition: A | Discharge status: Home Health | Payer: Medicare Other | Attending: Family Medicine | Admitting: Family Medicine

## 2017-01-10 DIAGNOSIS — K219 Gastro-esophageal reflux disease without esophagitis: Secondary | ICD-10-CM | POA: Diagnosis present

## 2017-01-10 DIAGNOSIS — R112 Nausea with vomiting, unspecified: Secondary | ICD-10-CM

## 2017-01-10 DIAGNOSIS — Z89439 Acquired absence of unspecified foot: Secondary | ICD-10-CM

## 2017-01-10 DIAGNOSIS — D72829 Elevated white blood cell count, unspecified: Secondary | ICD-10-CM

## 2017-01-10 DIAGNOSIS — A419 Sepsis, unspecified organism: Secondary | ICD-10-CM | POA: Diagnosis not present

## 2017-01-10 DIAGNOSIS — I739 Peripheral vascular disease, unspecified: Secondary | ICD-10-CM

## 2017-01-10 DIAGNOSIS — Z933 Colostomy status: Secondary | ICD-10-CM

## 2017-01-10 DIAGNOSIS — E785 Hyperlipidemia, unspecified: Secondary | ICD-10-CM | POA: Diagnosis present

## 2017-01-10 DIAGNOSIS — I5042 Chronic combined systolic (congestive) and diastolic (congestive) heart failure: Secondary | ICD-10-CM | POA: Diagnosis present

## 2017-01-10 DIAGNOSIS — N186 End stage renal disease: Secondary | ICD-10-CM | POA: Diagnosis present

## 2017-01-10 DIAGNOSIS — Z955 Presence of coronary angioplasty implant and graft: Secondary | ICD-10-CM

## 2017-01-10 DIAGNOSIS — I2510 Atherosclerotic heart disease of native coronary artery without angina pectoris: Secondary | ICD-10-CM | POA: Diagnosis present

## 2017-01-10 DIAGNOSIS — Z7982 Long term (current) use of aspirin: Secondary | ICD-10-CM

## 2017-01-10 DIAGNOSIS — I429 Cardiomyopathy, unspecified: Secondary | ICD-10-CM | POA: Diagnosis present

## 2017-01-10 DIAGNOSIS — D631 Anemia in chronic kidney disease: Secondary | ICD-10-CM | POA: Diagnosis present

## 2017-01-10 DIAGNOSIS — Z992 Dependence on renal dialysis: Secondary | ICD-10-CM | POA: Diagnosis not present

## 2017-01-10 DIAGNOSIS — E11621 Type 2 diabetes mellitus with foot ulcer: Secondary | ICD-10-CM | POA: Diagnosis present

## 2017-01-10 DIAGNOSIS — Z91018 Allergy to other foods: Secondary | ICD-10-CM

## 2017-01-10 DIAGNOSIS — Z936 Other artificial openings of urinary tract status: Secondary | ICD-10-CM | POA: Diagnosis not present

## 2017-01-10 DIAGNOSIS — N184 Chronic kidney disease, stage 4 (severe): Secondary | ICD-10-CM | POA: Diagnosis not present

## 2017-01-10 DIAGNOSIS — R531 Weakness: Secondary | ICD-10-CM | POA: Diagnosis not present

## 2017-01-10 DIAGNOSIS — D649 Anemia, unspecified: Secondary | ICD-10-CM | POA: Diagnosis present

## 2017-01-10 DIAGNOSIS — L97519 Non-pressure chronic ulcer of other part of right foot with unspecified severity: Secondary | ICD-10-CM | POA: Diagnosis present

## 2017-01-10 DIAGNOSIS — K8020 Calculus of gallbladder without cholecystitis without obstruction: Secondary | ICD-10-CM | POA: Diagnosis not present

## 2017-01-10 DIAGNOSIS — N10 Acute tubulo-interstitial nephritis: Secondary | ICD-10-CM | POA: Diagnosis not present

## 2017-01-10 DIAGNOSIS — A084 Viral intestinal infection, unspecified: Secondary | ICD-10-CM | POA: Diagnosis present

## 2017-01-10 DIAGNOSIS — I110 Hypertensive heart disease with heart failure: Secondary | ICD-10-CM | POA: Diagnosis not present

## 2017-01-10 DIAGNOSIS — Z833 Family history of diabetes mellitus: Secondary | ICD-10-CM

## 2017-01-10 DIAGNOSIS — Z809 Family history of malignant neoplasm, unspecified: Secondary | ICD-10-CM

## 2017-01-10 DIAGNOSIS — E669 Obesity, unspecified: Secondary | ICD-10-CM | POA: Diagnosis present

## 2017-01-10 DIAGNOSIS — Z8249 Family history of ischemic heart disease and other diseases of the circulatory system: Secondary | ICD-10-CM

## 2017-01-10 DIAGNOSIS — E876 Hypokalemia: Secondary | ICD-10-CM | POA: Diagnosis present

## 2017-01-10 DIAGNOSIS — E8889 Other specified metabolic disorders: Secondary | ICD-10-CM | POA: Diagnosis present

## 2017-01-10 DIAGNOSIS — Z8674 Personal history of sudden cardiac arrest: Secondary | ICD-10-CM

## 2017-01-10 DIAGNOSIS — N390 Urinary tract infection, site not specified: Secondary | ICD-10-CM | POA: Diagnosis not present

## 2017-01-10 DIAGNOSIS — Z8673 Personal history of transient ischemic attack (TIA), and cerebral infarction without residual deficits: Secondary | ICD-10-CM

## 2017-01-10 DIAGNOSIS — N2581 Secondary hyperparathyroidism of renal origin: Secondary | ICD-10-CM | POA: Diagnosis present

## 2017-01-10 DIAGNOSIS — E119 Type 2 diabetes mellitus without complications: Secondary | ICD-10-CM

## 2017-01-10 DIAGNOSIS — Z6832 Body mass index (BMI) 32.0-32.9, adult: Secondary | ICD-10-CM

## 2017-01-10 DIAGNOSIS — I5022 Chronic systolic (congestive) heart failure: Secondary | ICD-10-CM | POA: Diagnosis not present

## 2017-01-10 DIAGNOSIS — K2970 Gastritis, unspecified, without bleeding: Secondary | ICD-10-CM | POA: Diagnosis not present

## 2017-01-10 DIAGNOSIS — N12 Tubulo-interstitial nephritis, not specified as acute or chronic: Secondary | ICD-10-CM | POA: Diagnosis present

## 2017-01-10 DIAGNOSIS — E1122 Type 2 diabetes mellitus with diabetic chronic kidney disease: Secondary | ICD-10-CM | POA: Diagnosis present

## 2017-01-10 DIAGNOSIS — I132 Hypertensive heart and chronic kidney disease with heart failure and with stage 5 chronic kidney disease, or end stage renal disease: Secondary | ICD-10-CM | POA: Diagnosis present

## 2017-01-10 DIAGNOSIS — Z91041 Radiographic dye allergy status: Secondary | ICD-10-CM

## 2017-01-10 DIAGNOSIS — I5032 Chronic diastolic (congestive) heart failure: Secondary | ICD-10-CM | POA: Diagnosis not present

## 2017-01-10 DIAGNOSIS — D509 Iron deficiency anemia, unspecified: Secondary | ICD-10-CM | POA: Diagnosis not present

## 2017-01-10 DIAGNOSIS — Z888 Allergy status to other drugs, medicaments and biological substances status: Secondary | ICD-10-CM

## 2017-01-10 DIAGNOSIS — I119 Hypertensive heart disease without heart failure: Secondary | ICD-10-CM | POA: Diagnosis present

## 2017-01-10 DIAGNOSIS — R109 Unspecified abdominal pain: Secondary | ICD-10-CM | POA: Diagnosis not present

## 2017-01-10 DIAGNOSIS — Z85038 Personal history of other malignant neoplasm of large intestine: Secondary | ICD-10-CM

## 2017-01-10 DIAGNOSIS — I447 Left bundle-branch block, unspecified: Secondary | ICD-10-CM | POA: Diagnosis present

## 2017-01-10 DIAGNOSIS — R5383 Other fatigue: Secondary | ICD-10-CM | POA: Diagnosis not present

## 2017-01-10 DIAGNOSIS — I469 Cardiac arrest, cause unspecified: Secondary | ICD-10-CM

## 2017-01-10 DIAGNOSIS — Z89431 Acquired absence of right foot: Secondary | ICD-10-CM

## 2017-01-10 DIAGNOSIS — Z89432 Acquired absence of left foot: Secondary | ICD-10-CM | POA: Diagnosis not present

## 2017-01-10 LAB — I-STAT CHEM 8, ED
BUN: 29 mg/dL — AB (ref 6–20)
CREATININE: 4.2 mg/dL — AB (ref 0.44–1.00)
Calcium, Ion: 0.98 mmol/L — ABNORMAL LOW (ref 1.15–1.40)
Chloride: 97 mmol/L — ABNORMAL LOW (ref 101–111)
GLUCOSE: 151 mg/dL — AB (ref 65–99)
HCT: 39 % (ref 36.0–46.0)
HEMOGLOBIN: 13.3 g/dL (ref 12.0–15.0)
POTASSIUM: 3.1 mmol/L — AB (ref 3.5–5.1)
Sodium: 140 mmol/L (ref 135–145)
TCO2: 27 mmol/L (ref 22–32)

## 2017-01-10 LAB — URINALYSIS, ROUTINE W REFLEX MICROSCOPIC
BILIRUBIN URINE: NEGATIVE
GLUCOSE, UA: 150 mg/dL — AB
KETONES UR: NEGATIVE mg/dL
NITRITE: POSITIVE — AB
PH: 8 (ref 5.0–8.0)
Protein, ur: 100 mg/dL — AB
Specific Gravity, Urine: 1.011 (ref 1.005–1.030)
Squamous Epithelial / LPF: NONE SEEN

## 2017-01-10 LAB — CBC WITH DIFFERENTIAL/PLATELET
Basophils Absolute: 0 10*3/uL (ref 0.0–0.1)
Basophils Relative: 0 %
Eosinophils Absolute: 0 10*3/uL (ref 0.0–0.7)
Eosinophils Relative: 0 %
HCT: 35.1 % — ABNORMAL LOW (ref 36.0–46.0)
HEMOGLOBIN: 11.1 g/dL — AB (ref 12.0–15.0)
LYMPHS ABS: 0.4 10*3/uL — AB (ref 0.7–4.0)
LYMPHS PCT: 2 %
MCH: 26.4 pg (ref 26.0–34.0)
MCHC: 31.6 g/dL (ref 30.0–36.0)
MCV: 83.4 fL (ref 78.0–100.0)
Monocytes Absolute: 1.1 10*3/uL — ABNORMAL HIGH (ref 0.1–1.0)
Monocytes Relative: 6 %
NEUTROS PCT: 92 %
Neutro Abs: 17 10*3/uL — ABNORMAL HIGH (ref 1.7–7.7)
Platelets: 237 10*3/uL (ref 150–400)
RBC: 4.21 MIL/uL (ref 3.87–5.11)
RDW: 16.2 % — ABNORMAL HIGH (ref 11.5–15.5)
WBC: 18.5 10*3/uL — AB (ref 4.0–10.5)

## 2017-01-10 LAB — GLUCOSE, CAPILLARY: Glucose-Capillary: 123 mg/dL — ABNORMAL HIGH (ref 65–99)

## 2017-01-10 LAB — COMPREHENSIVE METABOLIC PANEL
ALK PHOS: 185 U/L — AB (ref 38–126)
AST: 17 U/L (ref 15–41)
Albumin: 2.4 g/dL — ABNORMAL LOW (ref 3.5–5.0)
Anion gap: 13 (ref 5–15)
BUN: 28 mg/dL — ABNORMAL HIGH (ref 6–20)
CALCIUM: 8.1 mg/dL — AB (ref 8.9–10.3)
CO2: 26 mmol/L (ref 22–32)
CREATININE: 4.4 mg/dL — AB (ref 0.44–1.00)
Chloride: 98 mmol/L — ABNORMAL LOW (ref 101–111)
GFR, EST AFRICAN AMERICAN: 11 mL/min — AB (ref >60)
GFR, EST NON AFRICAN AMERICAN: 9 mL/min — AB (ref >60)
Glucose, Bld: 142 mg/dL — ABNORMAL HIGH (ref 65–99)
Potassium: 3.4 mmol/L — ABNORMAL LOW (ref 3.5–5.1)
Sodium: 137 mmol/L (ref 135–145)
Total Bilirubin: 0.5 mg/dL (ref 0.3–1.2)
Total Protein: 6.3 g/dL — ABNORMAL LOW (ref 6.5–8.1)

## 2017-01-10 LAB — I-STAT TROPONIN, ED: TROPONIN I, POC: 0.08 ng/mL (ref 0.00–0.08)

## 2017-01-10 LAB — CBG MONITORING, ED: GLUCOSE-CAPILLARY: 148 mg/dL — AB (ref 65–99)

## 2017-01-10 LAB — I-STAT CG4 LACTIC ACID, ED
Lactic Acid, Venous: 2.94 mmol/L (ref 0.5–1.9)
Lactic Acid, Venous: 2.63 mmol/L (ref 0.5–1.9)

## 2017-01-10 LAB — LIPASE, BLOOD: Lipase: 23 U/L (ref 11–51)

## 2017-01-10 MED ORDER — DORZOLAMIDE HCL-TIMOLOL MAL 2-0.5 % OP SOLN
1.0000 [drp] | Freq: Every day | OPHTHALMIC | Status: DC
  Administered 2017-01-10 – 2017-01-13 (×4): 1 [drp] via OPHTHALMIC
  Filled 2017-01-10: qty 10

## 2017-01-10 MED ORDER — ONDANSETRON HCL 4 MG/2ML IJ SOLN: 4.0000 mg | Freq: Once | INTRAMUSCULAR | Status: DC

## 2017-01-10 MED ORDER — ZOLPIDEM TARTRATE 5 MG PO TABS
5.0000 mg | ORAL_TABLET | Freq: Every evening | ORAL | Status: DC | PRN
  Administered 2017-01-11 – 2017-01-12 (×2): 5 mg via ORAL
  Filled 2017-01-10 (×2): qty 1

## 2017-01-10 MED ORDER — METOPROLOL TARTRATE 50 MG PO TABS
50.0000 mg | ORAL_TABLET | ORAL | Status: DC
  Administered 2017-01-11: 50 mg via ORAL
  Filled 2017-01-10 (×2): qty 1

## 2017-01-10 MED ORDER — CALCIUM ACETATE (PHOS BINDER) 667 MG PO CAPS
2001.0000 mg | ORAL_CAPSULE | Freq: Three times a day (TID) | ORAL | Status: DC
  Administered 2017-01-11 – 2017-01-13 (×5): 2001 mg via ORAL
  Filled 2017-01-10 (×8): qty 3

## 2017-01-10 MED ORDER — SIMVASTATIN 20 MG PO TABS
10.0000 mg | ORAL_TABLET | Freq: Every evening | ORAL | Status: DC
  Administered 2017-01-10 – 2017-01-12 (×3): 10 mg via ORAL
  Filled 2017-01-10 (×3): qty 1

## 2017-01-10 MED ORDER — PIPERACILLIN-TAZOBACTAM 3.375 G IVPB
3.3750 g | Freq: Two times a day (BID) | INTRAVENOUS | Status: DC
  Administered 2017-01-11 – 2017-01-13 (×5): 3.375 g via INTRAVENOUS
  Filled 2017-01-10 (×5): qty 50

## 2017-01-10 MED ORDER — ALBUTEROL SULFATE HFA 108 (90 BASE) MCG/ACT IN AERS: 1.0000 | INHALATION_SPRAY | Freq: Four times a day (QID) | RESPIRATORY_TRACT | Status: DC | PRN

## 2017-01-10 MED ORDER — INSULIN ASPART 100 UNIT/ML ~~LOC~~ SOLN: 0.0000 [IU] | Freq: Every day | SUBCUTANEOUS | Status: DC

## 2017-01-10 MED ORDER — HEPARIN SODIUM (PORCINE) 5000 UNIT/ML IJ SOLN
5000.0000 [IU] | Freq: Three times a day (TID) | INTRAMUSCULAR | Status: DC
  Administered 2017-01-10 – 2017-01-13 (×9): 5000 [IU] via SUBCUTANEOUS
  Filled 2017-01-10 (×9): qty 1

## 2017-01-10 MED ORDER — SODIUM CHLORIDE 0.9% FLUSH
3.0000 mL | Freq: Two times a day (BID) | INTRAVENOUS | Status: DC
  Administered 2017-01-10: 3 mL via INTRAVENOUS
  Administered 2017-01-11: 10 mL via INTRAVENOUS
  Administered 2017-01-12 – 2017-01-13 (×3): 3 mL via INTRAVENOUS

## 2017-01-10 MED ORDER — ALBUTEROL SULFATE (2.5 MG/3ML) 0.083% IN NEBU: 2.5000 mg | INHALATION_SOLUTION | Freq: Four times a day (QID) | RESPIRATORY_TRACT | Status: DC | PRN

## 2017-01-10 MED ORDER — INSULIN ASPART 100 UNIT/ML ~~LOC~~ SOLN
0.0000 [IU] | Freq: Three times a day (TID) | SUBCUTANEOUS | Status: DC
  Administered 2017-01-11: 1 [IU] via SUBCUTANEOUS
  Administered 2017-01-11: 2 [IU] via SUBCUTANEOUS
  Administered 2017-01-12: 1 [IU] via SUBCUTANEOUS
  Administered 2017-01-13: 3 [IU] via SUBCUTANEOUS
  Administered 2017-01-13: 2 [IU] via SUBCUTANEOUS

## 2017-01-10 MED ORDER — SODIUM CHLORIDE 0.9% FLUSH: 3.0000 mL | INTRAVENOUS | Status: DC | PRN

## 2017-01-10 MED ORDER — ONDANSETRON HCL 4 MG/2ML IJ SOLN
4.0000 mg | Freq: Four times a day (QID) | INTRAMUSCULAR | Status: DC | PRN
  Administered 2017-01-11: 4 mg via INTRAVENOUS
  Filled 2017-01-10: qty 2

## 2017-01-10 MED ORDER — PIPERACILLIN-TAZOBACTAM 3.375 G IVPB 30 MIN
3.3750 g | Freq: Once | INTRAVENOUS | Status: AC
  Administered 2017-01-10: 3.375 g via INTRAVENOUS
  Filled 2017-01-10: qty 50

## 2017-01-10 MED ORDER — FENTANYL CITRATE (PF) 100 MCG/2ML IJ SOLN
25.0000 ug | Freq: Once | INTRAMUSCULAR | Status: AC
  Administered 2017-01-10: 25 ug via INTRAVENOUS
  Filled 2017-01-10: qty 2

## 2017-01-10 MED ORDER — FENTANYL CITRATE (PF) 100 MCG/2ML IJ SOLN: 50.0000 ug | Freq: Once | INTRAMUSCULAR | Status: DC

## 2017-01-10 MED ORDER — SODIUM CHLORIDE 0.9 % IV BOLUS (SEPSIS)
500.0000 mL | Freq: Once | INTRAVENOUS | Status: AC
  Administered 2017-01-10: 500 mL via INTRAVENOUS

## 2017-01-10 MED ORDER — ASPIRIN 81 MG PO CHEW
81.0000 mg | CHEWABLE_TABLET | Freq: Every day | ORAL | Status: DC
  Administered 2017-01-11 – 2017-01-13 (×3): 81 mg via ORAL
  Filled 2017-01-10 (×3): qty 1

## 2017-01-10 MED ORDER — GLUCOSE BLOOD VI STRP: 1.0000 | ORAL_STRIP | Freq: Every day | Status: DC

## 2017-01-10 MED ORDER — SODIUM CHLORIDE 0.9 % IV SOLN: 250.0000 mL | INTRAVENOUS | Status: DC | PRN

## 2017-01-10 MED ORDER — ONDANSETRON HCL 4 MG PO TABS: 4.0000 mg | ORAL_TABLET | Freq: Four times a day (QID) | ORAL | Status: DC | PRN

## 2017-01-10 NOTE — ED Notes (Addendum)
Pt denies pain but states her right foot is having intermittent sharp pains that feels like electricity.

## 2017-01-10 NOTE — ED Notes (Signed)
ED Provider at bedside. 

## 2017-01-10 NOTE — ED Notes (Signed)
Lactic acid result 2.94 given to Dr. Tomi Bamberger

## 2017-01-10 NOTE — ED Notes (Signed)
Phlebotomy at bedside.

## 2017-01-10 NOTE — Progress Notes (Signed)
Pharmacy Antibiotic Note  SHAYDEN GINGRICH is a 72 y.o. female admitted on 01/10/2017 with intra-abdominal infection.  Pharmacy has been consulted for Zosyn dosing. ESRD on dialysis (last session PTA on 1/2), Scr 4.2, Afebrile, Elevated WBC to 18.5, Elevated lactic acid to 2.63  Plan: Zosyn 3.375gm IV q12h (4 hour infusion) F/u renal plans, C&S, and clinical status  Weight: 165 lb (74.8 kg)  Temp (24hrs), Avg:98.9 F (37.2 C), Min:98.9 F (37.2 C), Max:98.9 F (37.2 C)  Recent Labs  Lab 01/07/17 1555 01/10/17 1625 01/10/17 1638 01/10/17 1845  WBC 14.5* 18.5*  --   --   CREATININE 3.82* 4.40* 4.20*  --   LATICACIDVEN  --   --  2.94* 2.63*    Estimated Creatinine Clearance: 11.1 mL/min (A) (by C-G formula based on SCr of 4.2 mg/dL (H)).    Allergies  Allergen Reactions  . Ace Inhibitors Cough  . Eggs Or Egg-Derived Products Nausea And Vomiting  . Enalapril Cough  . Lisinopril Cough  . Omnipaque [Iohexol] Hives    Antimicrobials this admission: Zosyn 1/2 >>   Microbiology results: Pending  Thank you for allowing pharmacy to be a part of this patient's care.  Akari Crysler 01/10/2017 9:13 PM

## 2017-01-10 NOTE — ED Triage Notes (Signed)
Per EMS: pt from dialysis with c/o of dull bilateral flank pain x4 days and N/V x2 days.  Pt states it feels like constipation but is having normal bowel movements. Dialysis schedule MWF.  Pt had 1 hr and 45 minutes of dialysis tx today.   PTA vitals: BP 1120/60, HR 90, CBG 186, RR 18.

## 2017-01-10 NOTE — H&P (Addendum)
[facility]                                                                                    Patient Demographics  [name], is a 72 y.o. female  CSN: [id number]  MRN: [id number]  DOB - 18-Apr-1945  Admit Date - 01/10/2017  Outpatient Primary MD for the patient is Plotnikov, Evie Lacks, MD   With History of -  Past Medical History:  Diagnosis Date  . Acute respiratory failure with hypoxia ([place]) 10/23/2014  . Adenomatous duodenal polyp   . Anemia   . Arthritis    "hands" (11/17/2015)  . Atrial tachycardia ([place])    on amiod  . Cardiomyopathy- mixed   . CHF (congestive heart failure) ([place])   . Chronic diastolic heart failure ([place])   . Colon cancer ([place]) 1986  . Colostomy in place [facility])   . Coronary artery disease    Previously decreased EF; echo 113 normal LV function  . Dialysis patient ([place])   . ESRD (end stage renal disease) on dialysis [facility])    Dr [place]/Dr. Lyda Kalata.  M, W, Fr; [place] (11/17/2015)  . GERD (gastroesophageal reflux disease)   . Glaucoma   . Heart murmur   . Hernia, incisional    abd  . Hyperlipidemia   . Hypertension   . Hypertensive heart disease    sees Dr. Alain Marion  . LBBB (left bundle branch block)   . Mitral valve insufficiency and aortic valve insufficiency   . Obesity   . Stroke [facility])    2011/12  . Type II diabetes mellitus ([place])       Past Surgical History:  Procedure Laterality Date  . ARTERIOVENOUS GRAFT PLACEMENT Left 2010   "forearm"  . BALLOON DILATION N/A 08/17/2016   Procedure: POSSIBLE BALLOON DILATION POSSIBLE MALONEY;  Surgeon: Gatha Mayer, MD;  Location: [facility] END[facility];  Service: Endoscopy;  Laterality: N/A;  . CARDIAC CATHETERIZATION    . CARDIAC CATHETERIZATION N/A 10/27/2014   Procedure: Left Heart Cath and Coronary Angiography;  Surgeon: Leonie Man, MD;  Location: [place] CV LAB;  Service: Cardiovascular;  Laterality: N/A;  . CARDIAC CATHETERIZATION N/A 10/28/2014   Procedure: Coronary  Stent Intervention;  Surgeon: Peter M Martinique, MD;  Location: [place] CV LAB;  Service: Cardiovascular;  Laterality: N/A;  . CARDIAC CATHETERIZATION Bilateral 12/2008   R. heart cath showed elevated left and right heart filling pressures w/ pulmonary artery pressure elevated mildly out of proportion to the wedge. The left heart cath showed diffuse distal vessel disease as well as a 75% stenosis in the mid circumflex w/ a 90% stenosis of the ostial first obtuse marginal. These lesions were in close proximity. there was a 60-70%mild RCA stenosis.   [name] CATARACT EXTRACTION W/ INTRAOCULAR LENS  IMPLANT, BILATERAL Bilateral   . COLON SURGERY    . COLONOSCOPY    . COLOSTOMY  1986  . ELECTROCARDIOGRAM  04-27-06  . ELECTROPHYSIOLOGIC STUDY N/A 11/17/2015   Procedure: A-Tach Ablation;  Surgeon: Will Meredith Leeds, MD;  Location: [place] CV LAB;  Service: Cardiovascular;  Laterality: N/A;  . ESOPHAGOGASTRODUODENOSCOPY  03-18-04  . ESOPHAGOGASTRODUODENOSCOPY N/A 02/02/2013  Procedure: ESOPHAGOGASTRODUODENOSCOPY (EGD);  Surgeon: Irene Shipper, MD;  Location: [facility] ENDOSCOPY;  Service: Endoscopy;  Laterality: N/A;  . ESOPHAGOGASTRODUODENOSCOPY (EGD) WITH PROPOFOL N/A 08/17/2016   Procedure: ESOPHAGOGASTRODUODENOSCOPY (EGD) WITH PROPOFOL;  Surgeon: Gatha Mayer, MD;  Location: [facility];  Service: Endoscopy;  Laterality: N/A;   NO XRAY NEEDED  . EYE SURGERY    . FOOT AMPUTATION THROUGH METATARSAL  10-07-10   Right foot transmetatarsal  . INSERTION OF AHMED VALVE Right 08/20/2013   Procedure: INSERTION OF AHMED VALVE WITH Mitomycin C application;  Surgeon: Marylynn Pearson, MD;  Location: [place];  Service: Ophthalmology;  Laterality: Right;  . INSERTION OF AHMED VALVE Left 07/22/2014   Procedure: INSERTION OF AHMED VALVE WITH [place];  Surgeon: Marylynn Pearson, MD;  Location: [place];  Service: Ophthalmology;  Laterality: Left;  . LEFT HEART CATH AND CORONARY ANGIOGRAPHY N/A 08/18/2016   Procedure: LEFT HEART  CATH AND CORONARY ANGIOGRAPHY;  Surgeon: Martinique, Peter M, MD;  Location: [place] CV LAB;  Service: Cardiovascular;  Laterality: N/A;  . MITOMYCIN C APPLICATION Left 2/35/5732   Procedure: MITOMYCIN C APPLICATION;  Surgeon: Marylynn Pearson, MD;  Location: [place];  Service: Ophthalmology;  Laterality: Left;  . PARS PLANA VITRECTOMY  11/29/2011   Procedure: PARS PLANA VITRECTOMY WITH 23 GAUGE;  Surgeon: Adonis Brook, MD;  Location: [place];  Service: Ophthalmology;  Laterality: Right;  Right Eye 23 ga vitrectomy with membrane peel  . PARS PLANA VITRECTOMY Left 02/28/2012   Procedure: PARS PLANA VITRECTOMY WITH 23 GAUGE;  Surgeon: Adonis Brook, MD;  Location: [place];  Service: Ophthalmology;  Laterality: Left;  . REVISION UROSTOMY CUTANEOUS    . SUPRAVENTRICULAR TACHYCARDIA ABLATION  11/17/2015  . VAGINAL HYSTERECTOMY      in for   Chief Complaint  Patient presents with  . Flank Pain  . Emesis     HPI  [name]  is a 72 y.o. female, with past medical history significant for end-stage renal disease, diabetes mellitus and history of colon cancer status post colostomy and urostomy, status post bladder removal and significant history for cardiac disease , congestive heart failure and supraventricular tachycardia, presenting with few days history of back pain with nausea and vomiting. She was seen in the emergency room on Sunday and one started on PO Bactrim and Augmentin. The patient took these medications however the pain started getting worse. Patient denies any history of abdominal pain, fever or chills. Today she had partial dialysis and had to stop one hour and 45 minutes before her regular time. Patient has history of gastritis status post endoscopy 4 months ago.    Review of Systems    In addition to the HPI above,  No Fever-chills, No Headache, No changes with Vision or hearing, No Chest pain, Cough or Shortness of Breath, No Abdominal pain, No Nausea or Vommitting, Bowel movements  are regular, No Blood in stool or Urine, No dysuria, No new skin rashes or bruises, No new joints pains-aches,  No new weakness, tingling, numbness in any extremity, No recent weight gain or loss, No polyuria, polydypsia or polyphagia, No significant Mental Stressors.  A full 10 point Review of Systems was done, except as stated above, all other Review of Systems were negative.   Social History Social History   Tobacco Use  . Smoking status: Never Smoker  . Smokeless tobacco: Never Used  Substance Use Topics  . Alcohol use: No     Family History Family History  Problem Relation  Age of Onset  . Hypertension Mother   . Coronary artery disease Mother   . Hypertension Father   . Diabetes Father   . Cancer Sister        colon  . Hypertension Other      Prior to Admission medications   Medication Sig Start Date End Date Taking? Authorizing Provider  acetaminophen (TYLENOL) 500 MG tablet Take 500-1,000 mg by mouth every 6 (six) hours as needed (pain).    Yes [provider]  albuterol (PROVENTIL HFA;VENTOLIN HFA) 108 (90 Base) MCG/ACT inhaler Inhale 1-2 puffs into the lungs every 6 (six) hours as needed for wheezing or shortness of breath.   Yes [provider]  amoxicillin-clavulanate (AUGMENTIN) 875-125 MG tablet Take 1 tablet by mouth 2 (two) times daily for 14 days. 01/07/17 01/21/17 Yes Couture, Cortni S, PA-C  aspirin 81 MG tablet Take 1 tablet (81 mg total) by mouth daily. 10/29/14  Yes Dhungel, Nishant, MD  calcium acetate (PHOSLO) 667 MG capsule Take 2,001-2,668 mg by mouth See admin instructions. 2,001-2,668 mg three times a day with meals   Yes [provider]  Calcium Carbonate Antacid (ALKA-SELTZER ANTACID PO) Take 2 tablets by mouth daily as needed (for indigestion).    Yes [provider]  dorzolamide-timolol (COSOPT) 22.3-6.8 MG/ML ophthalmic solution Place 1 drop into the left eye daily.  11/09/15  Yes [provider]   lidocaine-prilocaine (EMLA) cream Apply 1 application topically as needed (topical anesthesia for hemodialysis if Gebauers and Lidocaine injection are ineffective.). 08/20/16  Yes Allie Bossier, MD  metoprolol tartrate (LOPRESSOR) 50 MG tablet Take 1 tablet (50 mg total) by mouth every Tuesday, Thursday, Saturday, and Sunday. 08/20/16  Yes Allie Bossier, MD  silver sulfADIAZINE (SILVADENE) 1 % cream Apply 1 application topically See admin instructions. 1 application to right foot once a day 12/29/16  Yes [provider]  simvastatin (ZOCOR) 10 MG tablet Take 1 tablet (10 mg total) by mouth every evening. Patient taking differently: Take 10 mg by mouth daily.  05/23/16  Yes Camnitz, Will Hassell Done, MD  sulfamethoxazole-trimethoprim (BACTRIM) 400-80 MG tablet Please take 2 tablets tomorrow on 12/31.  Then continue to take 1 table per day for the next 13 days.  On dialysis days, take the tablet after dialysis. Patient taking differently: Take 1 tablet by mouth See admin instructions. 1 tablet once a day and take AFTER dialysis on "dialysis days" 01/07/17  Yes Couture, Cortni S, PA-C  glucose blood (ONE TOUCH ULTRA TEST) test strip 1 each by Other route daily. And lancets 1/day 03/16/16   Renato Shin, MD  nitroGLYCERIN (NITROSTAT) 0.4 MG SL tablet Place 1 tablet (0.4 mg total) under the tongue every 5 (five) minutes as needed for chest pain. Patient not taking: Reported on 01/10/2017 10/29/14   Dhungel, Flonnie Overman, MD  pantoprazole (PROTONIX) 40 MG tablet Take 1 tablet (40 mg total) by mouth daily before breakfast. Patient not taking: Reported on 01/10/2017 07/25/16   Gatha Mayer, MD    Allergies  Allergen Reactions  . Ace Inhibitors Cough  . Eggs Or Egg-Derived Products Nausea And Vomiting  . Enalapril Cough  . Lisinopril Cough  . Omnipaque [Iohexol] Hives    Physical Exam  Vitals  Blood pressure 121/70, pulse 82, resp. rate 17, weight 74.8 kg (165 lb), SpO2 100 %.   1. General  elderly female looks chronically ill  2. Normal affect and insight, Not Suicidal or Homicidal, Awake Alert, Oriented X 3.  3.  No F.N deficits, grossly, patient moving all extremities.  4. Ears and Eyes appear Normal, Conjunctivae clear, PERRLA. Moist Oral Mucosa.  5. Supple Neck, No JVD, No cervical lymphadenopathy appriciated, No Carotid Bruits.  6. Symmetrical Chest Walls movement, Good air movement bilaterally, CTAB.  7. RRR, No Gallops, Rubs or Murmurs, No Parasternal Heave.  8. Positive Bowel Sounds, Abdomen Soft, colostomy and urostomy noted.  9.  No Cyanosis, Normal Skin Turgor, No Skin Rash or Bruise.  10. Good muscle tone,  joints appear normal , no effusions, Normal ROM.    Data Review  CBC Recent Labs  Lab 01/07/17 1555 01/10/17 1625 01/10/17 1638  WBC 14.5* 18.5*  --   HGB 12.2 11.1* 13.3  HCT 38.0 35.1* 39.0  PLT 207 237  --   MCV 86.8 83.4  --   MCH 27.9 26.4  --   MCHC 32.1 31.6  --   RDW 16.7* 16.2*  --   LYMPHSABS  --  0.4*  --   MONOABS  --  1.1*  --   EOSABS  --  0.0  --   BASOSABS  --  0.0  --    ------------------------------------------------------------------------------------------------------------------  Chemistries  Recent Labs  Lab 01/07/17 1555 01/10/17 1625 01/10/17 1638  NA 137 137 140  K 3.1* 3.4* 3.1*  CL 97* 98* 97*  CO2 26 26  --   GLUCOSE 155* 142* 151*  BUN 24* 28* 29*  CREATININE 3.82* 4.40* 4.20*  CALCIUM 8.4* 8.1*  --   AST 17 17  --   ALT 10* <5*  --   ALKPHOS 140* 185*  --   BILITOT 1.4* 0.5  --    ------------------------------------------------------------------------------------------------------------------ estimated creatinine clearance is 11.1 mL/min (A) (by C-G formula based on SCr of 4.2 mg/dL (H)). ------------------------------------------------------------------------------------------------------------------ No results for input(s): TSH, T4TOTAL, T3FREE, THYROIDAB in the last 72 hours.  Invalid  input(s): FREET3   Coagulation profile Recent Labs  Lab 01/07/17 1555  INR 1.23   ------------------------------------------------------------------------------------------------------------------- No results for input(s): DDIMER in the last 72 hours. -------------------------------------------------------------------------------------------------------------------  Cardiac Enzymes No results for input(s): CKMB, TROPONINI, MYOGLOBIN in the last 168 hours.  Invalid input(s): CK ------------------------------------------------------------------------------------------------------------------ Invalid input(s): POCBNP   ---------------------------------------------------------------------------------------------------------------  Urinalysis    Component Value Date/Time   COLORURINE YELLOW 11/03/2016 1641   APPEARANCEUR TURBID (A) 11/03/2016 1641   LABSPEC 1.012 11/03/2016 1641   PHURINE 7.0 11/03/2016 1641   GLUCOSEU NEGATIVE 11/03/2016 1641   HGBUR LARGE (A) 11/03/2016 1641   BILIRUBINUR NEGATIVE 11/03/2016 1641   KETONESUR NEGATIVE 11/03/2016 1641   PROTEINUR 100 (A) 11/03/2016 1641   UROBILINOGEN 4.0 (H) 05/20/2011 2219   NITRITE NEGATIVE 11/03/2016 1641   LEUKOCYTESUR LARGE (A) 11/03/2016 1641    ----------------------------------------------------------------------------------------------------------------    Imaging results:   Ct Abdomen Pelvis Wo Contrast  Result Date: 01/10/2017 CLINICAL DATA:  Back pain with nausea and vomiting. EXAM: CT ABDOMEN AND PELVIS WITHOUT CONTRAST TECHNIQUE: Multidetector CT imaging of the abdomen and pelvis was performed following the standard protocol without IV contrast. COMPARISON:  05/21/2011 FINDINGS: Lower chest:  Cardiomegaly.  Coronary artery calcification evident. Hepatobiliary: Liver measures 19.4 cm craniocaudal length, enlarged. Calcified stones are seen in the gallbladder. No intrahepatic or extrahepatic biliary dilation.  Pancreas: Pancreas is atrophic. Spleen: Similar appearance of the irregular 4.7 cm splenic lesion. Adrenals/Urinary Tract: No adrenal nodule or mass. Both kidneys are atrophic. Bladder is decompressed. Stomach/Bowel: Stomach is nondistended. No gastric Walls thickening. No evidence of outlet obstruction. Duodenum is normally positioned as is  the ligament of Treitz. No gross dilatation of small bowel. Bowel anastomosis identified anterior midline abdomen. There are bilateral ostomies, not well evaluated given the extensive body Walls edema and areas of fluid around the stoma on each side. On the previous study from 2013 , the patient appear to have a right lower quadrant loop ileostomy and a left lower quadrant end colostomy. There is a small volume of fluid associated with the right stoma on today's exam and a more prominent amount of fluid is seen and the left parastomal hernia. A collection of apparent stool and gas in the extreme low pelvis is similar to prior and presumably represents the patient's cecum, better demonstrated on the previous CT. Vascular/Lymphatic: Extensive calcification is noted and arterial anatomy. Upper normal retroperitoneal lymph nodes are evident. Reproductive: Uterus is apparently surgically absent. No definite adnexal mass. Other: Extensive body Walls edema is noted. There is a small amount of free fluid around the liver. Diffuse edema is noted in the mesentery and there is free fluid in the lower pelvis. Musculoskeletal: Extensive pelvic floor laxity is evident. IMPRESSION: 1. No evidence for overt bowel obstruction and no intraperitoneal free air is identified on today's study. Assessment is limited by the diffuse body Walls and mesenteric edema, free fluid, lack of intravenous contrast and extensive postsurgical changes. Probable loop ileostomy right lower quadrant with sigmoid end colostomy left lower quadrant. There is some fluid in the left lower quadrant parastomal hernia,  nonspecific. Free fluid is also noted in the anatomic pelvis. 2. Extensive vascular calcification. 3. Cholelithiasis. 4. Marked pelvic floor laxity. 5. No urinary bladder identified. Bladder may be markedly decompressed or surgically absent. Electronically Signed   By: Misty Stanley M.D.   On: 01/10/2017 17:31   Dg Chest Port 1 View  Result Date: 01/07/2017 CLINICAL DATA:  Hematemesis. EXAM: PORTABLE CHEST 1 VIEW COMPARISON:  11/03/2016 FINDINGS: 1503 hours. The cardio pericardial silhouette is enlarged. The lungs are clear without focal pneumonia, edema, pneumothorax or pleural effusion. Pulmonary nodule superimposed on the posterior left seventh rib was not visualized on the prior exam. The visualized bony structures of the thorax are intact. IMPRESSION: Cardiomegaly without edema or focal airspace consolidation. Small nodule in the left mid lung not seen on the prior study. CT chest without contrast recommended to further evaluate. Electronically Signed   By: Misty Stanley M.D.   On: 01/07/2017 15:28   Dg Foot 2 Views Right  Result Date: 01/07/2017 CLINICAL DATA:  Right foot ulcer at the second metatarsal. EXAM: RIGHT FOOT - 2 VIEW COMPARISON:  October 04, 2010 FINDINGS: Patient is status post partial amputation of the mid to distal right foot. The bony stumps demonstrate no evidence of osteopenia, bone destruction to suggest osteomyelitis. IMPRESSION: Status post prior amputation of the mid to distal right foot. No evidence of osteomyelitis. Electronically Signed   By: Abelardo Diesel M.D.   On: 01/07/2017 19:42      Assessment & Plan  1. Pyelonphritis. Patient's history is so complex the determine at this time. . 2. End-stage renal disease on hemodialysis. Had partial hemodialysis today     Consult nephrology in a.m. 3. Diabetes mellitus  Plan  IV Zosyn Consult nephrology in a.m. Check labs in a.m. Follow urine cx . It may be contamination due to urostomy . Consider ID consult in  a.m.  DVT Prophylaxis Heparin   AM Labs Ordered, also please review Full Orders    Code Status full  Disposition Plan: Home  Time  spent in minutes : 46 minutes  Condition GUARDED   @SIGNATURE @

## 2017-01-10 NOTE — ED Provider Notes (Signed)
[facility] EMERGENCY DEPARTMENT Provider Note   CSN: [id number] Arrival date & time: 01/10/17  1443     History   Chief Complaint Chief Complaint  Patient presents with  . Flank Pain  . Emesis    HPI [name] is a 72 y.o. female.  HPI [name] is a 72 y.o. female with history of end-stage renal disease on dialysis on dialysis,, CHF, cardiomyopathy, coronary disease, history of colon cancer with colostomy, CVA, diabetes, presents to emergency department complaining of nausea, vomiting, bilateral flank pain, abdominal pain.  Patient states her symptoms began 3 days ago.  She was seen in emergency department at that time, at that time she was having just the nausea and vomiting, and complaining of seeing blood in her emesis.  She states that the emesis did improve after her visit here, however she has developed increased pain in bilateral flank.  Patient came from dialysis today with her symptoms.  Patient had approximately 1 hour 45 minutes of dialysis today.  No treatment prior to coming in. Daughter states pt has been more confused and anxious in the last 3 days. Pt with decreased appetite. Endoscopy 4 months ago showing gastritis, with during procedure PEA arrest, with CPR for 1 min.  Past Medical History:  Diagnosis Date  . Acute respiratory failure with hypoxia ([place]) 10/23/2014  . Adenomatous duodenal polyp   . Anemia   . Arthritis    "hands" (11/17/2015)  . Atrial tachycardia ([place])    on amiod  . Cardiomyopathy- mixed   . CHF (congestive heart failure) ([place])   . Chronic diastolic heart failure ([place])   . Colon cancer ([place]) 1986  . Colostomy in place [facility])   . Coronary artery disease    Previously decreased EF; echo 113 normal LV function  . Dialysis patient ([place])   . ESRD (end stage renal disease) on dialysis [facility])    Dr Dunham/Dr. Lyda Kalata.  M, W, Fr; [place] (11/17/2015)  . GERD (gastroesophageal reflux disease)   . Glaucoma   . Heart  murmur   . Hernia, incisional    abd  . Hyperlipidemia   . Hypertension   . Hypertensive heart disease    sees Dr. Alain Marion  . LBBB (left bundle branch block)   . Mitral valve insufficiency and aortic valve insufficiency   . Obesity   . Stroke [facility])    2011/12  . Type II diabetes mellitus [facility])     Patient Active Problem List   Diagnosis Date Noted  . Syncope 11/03/2016  . PEA (Pulseless electrical activity) ([place])   . Chronic systolic CHF (congestive heart failure) ([place])   . Postprocedural hypotension   . End-stage renal disease on hemodialysis ([place])   . Cardiac arrest ([place]) 08/17/2016  . Erosive esophagitis   . Esophageal dysphagia   . Hematemesis with nausea   . Gastritis and gastroduodenitis   . Hard of hearing 04/25/2016  . GERD (gastroesophageal reflux disease) 02/08/2016  . Loss of weight 01/18/2016  . Atrial tachycardia ([place]) 11/17/2015  . Diabetes ([place]) 05/13/2015  . Ischemic cardiomyopathy   . Non-ST elevated myocardial infarction (non-STEMI) ([place])   . Dyspnea on exertion 10/23/2014  . Elevated troponin 10/23/2014  . Congestive heart failure (CHF) ([place]) 10/23/2014  . GI bleed 02/02/2013  . Chest pain 02/01/2013  . S/P transmetatarsal amputation of foot ([place]) 01/31/2013  . History of colon cancer   . Obesity   . ESRD on dialysis ([place]) 05/28/2012  .  Nausea 05/21/2011  . Anemia 05/21/2011  . Coronary artery disease   . CVA (cerebral infarction) 06/01/2010  . Acute on chronic systolic and diastolic heart failure, NYHA class 1 ([place]) 01/27/2009  . Atrial tachycardia    . LBBB (left bundle branch block) 04/10/2008  . Hyperlipidemia   . Hypertensive heart disease     Past Surgical History:  Procedure Laterality Date  . ARTERIOVENOUS GRAFT PLACEMENT Left 2010   "forearm"  . BALLOON DILATION N/A 08/17/2016   Procedure: POSSIBLE BALLOON DILATION POSSIBLE MALONEY;  Surgeon: Gatha Mayer, MD;  Location: [facility] ENDOSCOPY;  Service: Endoscopy;  Laterality: N/A;  .  CARDIAC CATHETERIZATION    . CARDIAC CATHETERIZATION N/A 10/27/2014   Procedure: Left Heart Cath and Coronary Angiography;  Surgeon: Leonie Man, MD;  Location: [facility] CV LAB;  Service: Cardiovascular;  Laterality: N/A;  . CARDIAC CATHETERIZATION N/A 10/28/2014   Procedure: Coronary Stent Intervention;  Surgeon: Peter M Martinique, MD;  Location: [facility] CV LAB;  Service: Cardiovascular;  Laterality: N/A;  . CARDIAC CATHETERIZATION Bilateral 12/2008   R. heart cath showed elevated left and right heart filling pressures w/ pulmonary artery pressure elevated mildly out of proportion to the wedge. The left heart cath showed diffuse distal vessel disease as well as a 75% stenosis in the mid circumflex w/ a 90% stenosis of the ostial first obtuse marginal. These lesions were in close proximity. there was a 60-70%mild RCA stenosis.   [name] CATARACT EXTRACTION W/ INTRAOCULAR LENS  IMPLANT, BILATERAL Bilateral   . COLON SURGERY    . COLONOSCOPY    . COLOSTOMY  1986  . ELECTROCARDIOGRAM  04-27-06  . ELECTROPHYSIOLOGIC STUDY N/A 11/17/2015   Procedure: A-Tach Ablation;  Surgeon: Will Meredith Leeds, MD;  Location: [facility] CV LAB;  Service: Cardiovascular;  Laterality: N/A;  . ESOPHAGOGASTRODUODENOSCOPY  03-18-04  . ESOPHAGOGASTRODUODENOSCOPY N/A 02/02/2013   Procedure: ESOPHAGOGASTRODUODENOSCOPY (EGD);  Surgeon: Irene Shipper, MD;  Location[facility]COPY;  Service: Endoscopy;  Laterality: N/A;  . ESOPHAGOGASTRODUODENOSCOPY (EGD) WITH PROPOFOL N/A 08/17/2016   Procedure: ESOPHAGOGASTRODUODENOSCOPY (EGD) WITH PROPOFOL;  Surgeon: Gatha Mayer, MD;  [facility]: [facility] END[facility];  Service: Endoscopy;  Laterality: N/A;   NO XRAY NEEDED  . EYE SURGERY    . FOOT AMPUTATION THROUGH METATARSAL  10-07-10   Right foot transmetatarsal  . INSERTION OF AHMED VALVE Right 08/20/2013   Procedure: INSERTION OF AHMED VALVE WITH Mitomycin C application;  Surgeon: Marylynn Pearson, MD;  Location: [facility];  Service: Ophthalmology;   Laterality: Right;  . INSERTION OF AHMED VALVE Left 07/22/2014   Procedure: INSERTION OF AHMED VALVE WITH [place];  Surgeon: Marylynn Pearson, MD;  Location: [facility];  Service: Ophthalmology;  Laterality: Left;  . LEFT HEART CATH AND CORONARY ANGIOGRAPHY N/A 08/18/2016   Procedure: LEFT HEART CATH AND CORONARY ANGIOGRAPHY;  Surgeon: Martinique, Peter M, MD;  Location: [facility] CV LAB;  Service: Cardiovascular;  Laterality: N/A;  . MITOMYCIN C APPLICATION Left 8/67/6720   Procedure: MITOMYCIN C APPLICATION;  Surgeon: Marylynn Pearson, MD;  Location: [facility];  Service: Ophthalmology;  Laterality: Left;  . PARS PLANA VITRECTOMY  11/29/2011   Procedure: PARS PLANA VITRECTOMY WITH 23 GAUGE;  Surgeon: Adonis Brook, MD;  Location: [facility];  Service: Ophthalmology;  Laterality: Right;  Right Eye 23 ga vitrectomy with membrane peel  . PARS PLANA VITRECTOMY Left 02/28/2012   Procedure: PARS PLANA VITRECTOMY WITH 23 GAUGE;  Surgeon: Adonis Brook, MD;  Location: [facility];  Service: Ophthalmology;  Laterality: Left;  . REVISION UROSTOMY CUTANEOUS    . SUPRAVENTRICULAR TACHYCARDIA ABLATION  11/17/2015  . VAGINAL HYSTERECTOMY      OB History    No data available       Home Medications    Prior to Admission medications   Medication Sig Start Date End Date Taking? Authorizing Provider  acetaminophen (TYLENOL) 500 MG tablet Take 500-1,000 mg by mouth every 6 (six) hours as needed (pain).     [provider]  albuterol (PROVENTIL HFA;VENTOLIN HFA) 108 (90 Base) MCG/ACT inhaler Inhale 1-2 puffs into the lungs every 6 (six) hours as needed for wheezing or shortness of breath.    [provider]  amoxicillin-clavulanate (AUGMENTIN) 875-125 MG tablet Take 1 tablet by mouth 2 (two) times daily for 14 days. 01/07/17 01/21/17  Couture, Cortni S, PA-C  aspirin 81 MG tablet Take 1 tablet (81 mg total) by mouth daily. 10/29/14   Dhungel, Flonnie Overman, MD  calcium acetate (PHOSLO) 667 MG capsule Take 1,334-2,001 mg by  mouth See admin instructions. Take 2001mg  three times daily with meals and Take 1334mg   with snacks.    [provider]  Calcium Carbonate Antacid (ALKA-SELTZER ANTACID PO) Take 2 tablets by mouth daily.    [provider]  dorzolamide-timolol (COSOPT) 22.3-6.8 MG/ML ophthalmic solution Place 1 drop into the left eye daily.  11/09/15   [provider]  glucose blood (ONE TOUCH ULTRA TEST) test strip 1 each by Other route daily. And lancets 1/day 03/16/16   Renato Shin, MD  lidocaine-prilocaine (EMLA) cream Apply 1 application topically as needed (topical anesthesia for hemodialysis if Gebauers and Lidocaine injection are ineffective.). 08/20/16   Allie Bossier, MD  metoprolol tartrate (LOPRESSOR) 50 MG tablet Take 1 tablet (50 mg total) by mouth every Tuesday, Thursday, Saturday, and Sunday. 08/20/16   Allie Bossier, MD  nitroGLYCERIN (NITROSTAT) 0.4 MG SL tablet Place 1 tablet (0.4 mg total) under the tongue every 5 (five) minutes as needed for chest pain. 10/29/14   Dhungel, Flonnie Overman, MD  pantoprazole (PROTONIX) 40 MG tablet Take 1 tablet (40 mg total) by mouth daily before breakfast. Patient not taking: Reported on 01/07/2017 07/25/16   Gatha Mayer, MD  silver sulfADIAZINE (SILVADENE) 1 % cream Apply 1 application topically daily. Right foot 12/29/16   [provider]  simvastatin (ZOCOR) 10 MG tablet Take 1 tablet (10 mg total) by mouth every evening. 05/23/16   Camnitz, Ocie Doyne, MD  sulfamethoxazole-trimethoprim (BACTRIM) 400-80 MG tablet Please take 2 tablets tomorrow on 12/31.  Then continue to take 1 table per day for the next 13 days.  On dialysis days, take the tablet after dialysis. 01/07/17   Couture, Cortni S, PA-C    Family History Family History  Problem Relation Age of Onset  . Hypertension Mother   . Coronary artery disease Mother   . Hypertension Father   . Diabetes Father   . Cancer Sister        colon  . Hypertension Other      Social History Social History   Tobacco Use  . Smoking status: Never Smoker  . Smokeless tobacco: Never Used  Substance Use Topics  . Alcohol use: No  . Drug use: No     Allergies   Ace inhibitors; Eggs or egg-derived products; Enalapril; Lisinopril; and Omnipaque [iohexol]   Review of Systems Review of Systems  Constitutional: Positive for appetite change and fatigue. Negative for chills and fever.  Respiratory: Negative for cough,  chest tightness and shortness of breath.   Cardiovascular: Negative for chest pain, palpitations and leg swelling.  Gastrointestinal: Positive for abdominal pain, nausea and vomiting. Negative for diarrhea.  Genitourinary: Positive for flank pain. Negative for dysuria, pelvic pain, vaginal bleeding, vaginal discharge and vaginal pain.  Musculoskeletal: Negative for arthralgias, myalgias, neck pain and neck stiffness.  Skin: Negative for rash.  Neurological: Positive for weakness. Negative for dizziness and headaches.  All other systems reviewed and are negative.    Physical Exam Updated Vital Signs BP 116/61 (BP Location: Right Arm)   Pulse 61   Resp 18   Wt 74.8 kg (165 lb)   SpO2 90%   BMI 32.22 kg/m   Physical Exam  Constitutional: She is oriented to person, place, and time. She appears well-developed and well-nourished. No distress.  HENT:  Head: Normocephalic.  Eyes: Conjunctivae are normal.  Neck: Neck supple.  Cardiovascular: Normal rate, regular rhythm and normal heart sounds.  Pulmonary/Chest: Effort normal and breath sounds normal. No respiratory distress. She has no wheezes. She has no rales.  Abdominal: Soft. Bowel sounds are normal. She exhibits no distension. There is tenderness. There is no rebound.  Epigastric tenderness, bilateral CVA tenderness.  Left-sided colostomy in place, with greenish brown stool.  Urostomy in place, yellow clear urine with sediment   Musculoskeletal: She exhibits no edema.  Right mid foot  amputation with a plantar ulceration, with diffuse tenderness around the ulceration. No signs of surrounding cellulitis at this time.   Neurological: She is alert and oriented to person, place, and time.  Skin: Skin is warm and dry.  Psychiatric:  Very anxious, tearful  Nursing note and vitals reviewed.    ED Treatments / Results  Labs (all labs ordered are listed, but only abnormal results are displayed) Labs Reviewed  CBC WITH DIFFERENTIAL/PLATELET - Abnormal; Notable for the following components:      Result Value   WBC 18.5 (*)    Hemoglobin 11.1 (*)    HCT 35.1 (*)    RDW 16.2 (*)    Neutro Abs 17.0 (*)    Lymphs Abs 0.4 (*)    Monocytes Absolute 1.1 (*)    All other components within normal limits  COMPREHENSIVE METABOLIC PANEL - Abnormal; Notable for the following components:   Potassium 3.4 (*)    Chloride 98 (*)    Glucose, Bld 142 (*)    BUN 28 (*)    Creatinine, Ser 4.40 (*)    Calcium 8.1 (*)    Total Protein 6.3 (*)    Albumin 2.4 (*)    ALT <5 (*)    Alkaline Phosphatase 185 (*)    GFR calc non Af Amer 9 (*)    GFR calc Af Amer 11 (*)    All other components within normal limits  I-STAT CHEM 8, ED - Abnormal; Notable for the following components:   Potassium 3.1 (*)    Chloride 97 (*)    BUN 29 (*)    Creatinine, Ser 4.20 (*)    Glucose, Bld 151 (*)    Calcium, Ion 0.98 (*)    All other components within normal limits  I-STAT CG4 LACTIC ACID, ED - Abnormal; Notable for the following components:   Lactic Acid, Venous 2.94 (*)    All other components within normal limits  I-STAT CG4 LACTIC ACID, ED - Abnormal; Notable for the following components:   Lactic Acid, Venous 2.63 (*)    All other components within normal limits  URINE CULTURE  LIPASE, BLOOD  URINALYSIS, ROUTINE W REFLEX MICROSCOPIC  I-STAT TROPONIN, ED    EKG  EKG Interpretation  Date/Time:  Wednesday January 10 2017 15:35:50 EST Ventricular Rate:  89 PR Interval:    QRS  Duration: 169 QT Interval:  457 QTC Calculation: 557 R Axis:   -31 Text Interpretation:  Sinus rhythm Atrial premature complex Left bundle branch block No significant change since last tracing Confirmed by Dorie Rank [id number]) on 01/10/2017 4:05:51 PM       Radiology Ct Abdomen Pelvis Wo Contrast  Result Date: 01/10/2017 CLINICAL DATA:  Back pain with nausea and vomiting. EXAM: CT ABDOMEN AND PELVIS WITHOUT CONTRAST TECHNIQUE: Multidetector CT imaging of the abdomen and pelvis was performed following the standard protocol without IV contrast. COMPARISON:  05/21/2011 FINDINGS: Lower chest:  Cardiomegaly.  Coronary artery calcification evident. Hepatobiliary: Liver measures 19.4 cm craniocaudal length, enlarged. Calcified stones are seen in the gallbladder. No intrahepatic or extrahepatic biliary dilation. Pancreas: Pancreas is atrophic. Spleen: Similar appearance of the irregular 4.7 cm splenic lesion. Adrenals/Urinary Tract: No adrenal nodule or mass. Both kidneys are atrophic. Bladder is decompressed. Stomach/Bowel: Stomach is nondistended. No gastric wall thickening. No evidence of outlet obstruction. Duodenum is normally positioned as is the ligament of Treitz. No gross dilatation of small bowel. Bowel anastomosis identified anterior midline abdomen. There are bilateral ostomies, not well evaluated given the extensive body wall edema and areas of fluid around the stoma on each side. On the previous study from 2013 , the patient appear to have a right lower quadrant loop ileostomy and a left lower quadrant end colostomy. There is a small volume of fluid associated with the right stoma on today's exam and a more prominent amount of fluid is seen and the left parastomal hernia. A collection of apparent stool and gas in the extreme low pelvis is similar to prior and presumably represents the patient's cecum, better demonstrated on the previous CT. Vascular/Lymphatic: Extensive calcification is noted and  arterial anatomy. Upper normal retroperitoneal lymph nodes are evident. Reproductive: Uterus is apparently surgically absent. No definite adnexal mass. Other: Extensive body wall edema is noted. There is a small amount of free fluid around the liver. Diffuse edema is noted in the mesentery and there is free fluid in the lower pelvis. Musculoskeletal: Extensive pelvic floor laxity is evident. IMPRESSION: 1. No evidence for overt bowel obstruction and no intraperitoneal free air is identified on today's study. Assessment is limited by the diffuse body wall and mesenteric edema, free fluid, lack of intravenous contrast and extensive postsurgical changes. Probable loop ileostomy right lower quadrant with sigmoid end colostomy left lower quadrant. There is some fluid in the left lower quadrant parastomal hernia, nonspecific. Free fluid is also noted in the anatomic pelvis. 2. Extensive vascular calcification. 3. Cholelithiasis. 4. Marked pelvic floor laxity. 5. No urinary bladder identified. Bladder may be markedly decompressed or surgically absent. Electronically Signed   By: Misty Stanley M.D.   On: 01/10/2017 17:31    Procedures Procedures (including critical care time)  Medications Ordered in ED Medications - No data to display   Initial Impression / Assessment and Plan / ED Course  I have reviewed the triage vital signs and the nursing notes.  Pertinent labs & imaging results that were available during my care of the patient were reviewed by me and considered in my medical decision making (see chart for details).     Patient from dialysis, here with persistent nausea, vomiting, bilateral flank  and abdominal pain.  Symptoms started 3 days ago, she was evaluated in department at that time.  No imaging of abdomen pelvis was obtained at that time, however patient according to the chart did not have much pain.  I will repeat labs today and get CT abdomen and pelvis.  7:26 PM CT abdomen and pelvis  showing diffuse mesenteric edema.  No explanation for the flank pain.  Lactic acid elevated.  White blood cell count elevated.  Concerning for infectious process versus ischemic bowel.  Patient given a small bolus of IV fluids, Zofran and fentanyl.  She states she feels slightly improved.  I spoke with the hospitalist, they will admit patient.   Vitals:   01/10/17 1836 01/10/17 1845 01/10/17 1858 01/10/17 1903  BP:  121/70    Pulse: 86 85 69 82  Resp: 11 17 17    SpO2: 96% 98% 95% 100%  Weight:         Final Clinical Impressions(s) / ED Diagnoses   Final diagnoses:  Nausea and vomiting, intractability of vomiting not specified, unspecified vomiting type    ED Discharge Orders    None       Jeannett Senior, PA-C 01/10/17 1935    Fatima Blank, MD 01/13/17 [phone]

## 2017-01-11 ENCOUNTER — Encounter: Payer: Medicare Other | Admitting: Vascular Surgery

## 2017-01-11 ENCOUNTER — Other Ambulatory Visit: Payer: Self-pay

## 2017-01-11 ENCOUNTER — Encounter (HOSPITAL_COMMUNITY): Payer: Medicare Other

## 2017-01-11 ENCOUNTER — Encounter (HOSPITAL_COMMUNITY): Payer: Self-pay | Admitting: *Deleted

## 2017-01-11 DIAGNOSIS — N10 Acute tubulo-interstitial nephritis: Secondary | ICD-10-CM

## 2017-01-11 DIAGNOSIS — I5022 Chronic systolic (congestive) heart failure: Secondary | ICD-10-CM

## 2017-01-11 DIAGNOSIS — A419 Sepsis, unspecified organism: Principal | ICD-10-CM

## 2017-01-11 DIAGNOSIS — N186 End stage renal disease: Secondary | ICD-10-CM

## 2017-01-11 LAB — BASIC METABOLIC PANEL
ANION GAP: 17 — AB (ref 5–15)
BUN: 33 mg/dL — ABNORMAL HIGH (ref 6–20)
CO2: 21 mmol/L — ABNORMAL LOW (ref 22–32)
Calcium: 8.1 mg/dL — ABNORMAL LOW (ref 8.9–10.3)
Chloride: 99 mmol/L — ABNORMAL LOW (ref 101–111)
Creatinine, Ser: 5 mg/dL — ABNORMAL HIGH (ref 0.44–1.00)
GFR calc Af Amer: 9 mL/min — ABNORMAL LOW (ref >60)
GFR, EST NON AFRICAN AMERICAN: 8 mL/min — AB (ref >60)
Glucose, Bld: 189 mg/dL — ABNORMAL HIGH (ref 65–99)
POTASSIUM: 3.3 mmol/L — AB (ref 3.5–5.1)
SODIUM: 137 mmol/L (ref 135–145)

## 2017-01-11 LAB — MRSA PCR SCREENING: MRSA BY PCR: POSITIVE — AB

## 2017-01-11 LAB — GLUCOSE, CAPILLARY
GLUCOSE-CAPILLARY: 172 mg/dL — AB (ref 65–99)
Glucose-Capillary: 99 mg/dL (ref 65–99)
Glucose-Capillary: 147 mg/dL — ABNORMAL HIGH (ref 65–99)
Glucose-Capillary: 156 mg/dL — ABNORMAL HIGH (ref 65–99)

## 2017-01-11 MED ORDER — MUPIROCIN 2 % EX OINT
1.0000 "application " | TOPICAL_OINTMENT | Freq: Two times a day (BID) | CUTANEOUS | Status: DC
  Administered 2017-01-11 – 2017-01-13 (×5): 1 via NASAL
  Filled 2017-01-11 (×2): qty 22

## 2017-01-11 MED ORDER — CHLORHEXIDINE GLUCONATE CLOTH 2 % EX PADS
6.0000 | MEDICATED_PAD | Freq: Every day | CUTANEOUS | Status: DC
  Administered 2017-01-11 – 2017-01-13 (×3): 6 via TOPICAL

## 2017-01-11 NOTE — Progress Notes (Signed)
PROGRESS NOTE    CAMANI SESAY  PJK:[id number] DOB: 1945/06/04 DOA: 01/10/2017 PCP: Cassandria Anger, MD      Brief Narrative:  Mrs Lenhard is a 72 yo F with ESRD on HD, CHF EF 25%, DM, colon cancer status post colostomy, and recent PEA arrest who presents from dialysis with nausea, vomiting, and bilateral flank pain.  Urinalysis showed full field RBCs/WBCs, new nitrites.  Started on empiric antibiotics.   Assessment & Plan:  Active Problems:   Leukocytosis   Pyelonephritis Sepsis Lactate improved with fluids, currently hemodynamically stable.  CT abdomen nonspecific but symptoms and UA consistent with pyelonephritis. -Continue empiric antibiotics with Zosyn -Follow urine and blood cultures   ESRD -Consulted nephrology, appreciate cares -Continue PhosLo  CHF EF 25% No signs of fluid overload -Continue metoprolol, statin and aspirin  Diabetes -Continue SSI  Hypokalemia -Per HD      DVT prophylaxis: Heparin Code Status: FULL Family Communication: None present Disposition Plan: IV fludis, empiric antibiotics.  Likely home in 1-2 days when urine culture and sensitivities are back.   Consultants:   Nephrology  Procedures:   None  Antimicrobials:   Zosyn 1/2 >>    Subjective: Feels weak, tired, sick.  Has flank pain.  No more vomiting.  No more fever, confusion.  No cough, dyspnea, sputum.  Objective: Vitals:   01/11/17 0016 01/11/17 0628 01/11/17 0900 01/11/17 1612  BP: (!) 93/41 (!) 108/48 (!) 100/50 (!) 97/44  Pulse: 72 77 80 69  Resp: 14 15 16 16   Temp: 98.8 F (37.1 C) 98.3 F (36.8 C) 98.6 F (37 C) 98.4 F (36.9 C)  TempSrc:   Oral Oral  SpO2: 97% 98% 99% 96%  Weight: 72.4 kg (159 lb 9.8 oz)     Height: 5' (1.524 m)       Intake/Output Summary (Last 24 hours) at 01/11/2017 1707 Last data filed at 01/11/2017 1325 Gross per 24 hour  Intake 840 ml  Output 250 ml  Net 590 ml   Filed Weights   01/10/17 1455 01/11/17 0016    Weight: 74.8 kg (165 lb) 72.4 kg (159 lb 9.8 oz)    Examination: General appearance: adult female, alert and in no acute distress.   HEENT: Anicteric, conjunctiva pink, lids and lashes normal. No nasal deformity, discharge, epistaxis.  Lips moist.   Skin: Warm and dry.  No suspicious rashes or lesions. Cardiac: RRR, nl S1-S2, no murmurs appreciated.  Capillary refill is brisk.  JVP normal.  No LE edema.  Radial pulses 2+ and symmetric. Respiratory: Normal respiratory rate and rhythm.  CTAB without rales or wheezes. Abdomen: Abdomen soft.  No TTP. No ascites, distension, hepatosplenomegaly.   MSK: No deformities or effusions.  BKA. Neuro: Awake and alert.  EOMI, moves all extremities. Speech fluent.    Psych: Sensorium intact and responding to questions, attention normal. Affect normal.  Judgment and insight appear normal.    Data Reviewed: I have personally reviewed following labs and imaging studies:  CBC: Recent Labs  Lab 01/07/17 1555 01/10/17 1625 01/10/17 1638  WBC 14.5* 18.5*  --   NEUTROABS  --  17.0*  --   HGB 12.2 11.1* 13.3  HCT 38.0 35.1* 39.0  MCV 86.8 83.4  --   PLT 207 237  --    Basic Metabolic Panel: Recent Labs  Lab 01/07/17 1555 01/10/17 1625 01/10/17 1638 01/11/17 0948  NA 137 137 140 137  K 3.1* 3.4* 3.1* 3.3*  CL 97* 98* 97* 99*  CO2 26 26  --  21*  GLUCOSE 155* 142* 151* 189*  BUN 24* 28* 29* 33*  CREATININE 3.82* 4.40* 4.20* 5.00*  CALCIUM 8.4* 8.1*  --  8.1*   GFR: Estimated Creatinine Clearance: 9.2 mL/min (A) (by C-G formula based on SCr of 5 mg/dL (H)). Liver Function Tests: Recent Labs  Lab 01/07/17 1555 01/10/17 1625  AST 17 17  ALT 10* <5*  ALKPHOS 140* 185*  BILITOT 1.4* 0.5  PROT 6.5 6.3*  ALBUMIN 2.6* 2.4*   Recent Labs  Lab 01/10/17 1625  LIPASE 23   No results for input(s): AMMONIA in the last 168 hours. Coagulation Profile: Recent Labs  Lab 01/07/17 1555  INR 1.23   Cardiac Enzymes: No results for  input(s): CKTOTAL, CKMB, CKMBINDEX, TROPONINI in the last 168 hours. BNP (last 3 results) No results for input(s): PROBNP in the last 8760 hours. HbA1C: No results for input(s): HGBA1C in the last 72 hours. CBG: Recent Labs  Lab 01/10/17 2151 01/10/17 2341 01/11/17 0740 01/11/17 1143 01/11/17 1651  GLUCAP 148* 123* 99 147* 156*   Lipid Profile: No results for input(s): CHOL, HDL, LDLCALC, TRIG, CHOLHDL, LDLDIRECT in the last 72 hours. Thyroid Function Tests: No results for input(s): TSH, T4TOTAL, FREET4, T3FREE, THYROIDAB in the last 72 hours. Anemia Panel: No results for input(s): VITAMINB12, FOLATE, FERRITIN, TIBC, IRON, RETICCTPCT in the last 72 hours. Urine analysis:    Component Value Date/Time   COLORURINE AMBER (A) 01/10/2017 2024   APPEARANCEUR TURBID (A) 01/10/2017 2024   LABSPEC 1.011 01/10/2017 2024   PHURINE 8.0 01/10/2017 2024   GLUCOSEU 150 (A) 01/10/2017 2024   HGBUR MODERATE (A) 01/10/2017 2024   BILIRUBINUR NEGATIVE 01/10/2017 2024   KETONESUR NEGATIVE 01/10/2017 2024   PROTEINUR 100 (A) 01/10/2017 2024   UROBILINOGEN 4.0 (H) 05/20/2011 2219   NITRITE POSITIVE (A) 01/10/2017 2024   LEUKOCYTESUR LARGE (A) 01/10/2017 2024   Sepsis Labs: @LABRCNTIP (procalcitonin:4,lacticacidven:4)  ) Recent Results (from the past 240 hour(s))  MRSA PCR Screening     Status: Abnormal   Collection Time: 01/11/17 12:01 AM  Result Value Ref Range Status   MRSA by PCR POSITIVE (A) NEGATIVE Final    Comment:        The GeneXpert MRSA Assay (FDA approved for NASAL specimens only), is one component of a comprehensive MRSA colonization surveillance program. It is not intended to diagnose MRSA infection nor to guide or monitor treatment for MRSA infections. RESULT CALLED TO, READ BACK BY AND VERIFIED WITHVicenta Aly RN [id number] 01/11/17 A BROWNING          Radiology Studies: Ct Abdomen Pelvis Wo Contrast  Result Date: 01/10/2017 CLINICAL DATA:  Back pain with nausea  and vomiting. EXAM: CT ABDOMEN AND PELVIS WITHOUT CONTRAST TECHNIQUE: Multidetector CT imaging of the abdomen and pelvis was performed following the standard protocol without IV contrast. COMPARISON:  05/21/2011 FINDINGS: Lower chest:  Cardiomegaly.  Coronary artery calcification evident. Hepatobiliary: Liver measures 19.4 cm craniocaudal length, enlarged. Calcified stones are seen in the gallbladder. No intrahepatic or extrahepatic biliary dilation. Pancreas: Pancreas is atrophic. Spleen: Similar appearance of the irregular 4.7 cm splenic lesion. Adrenals/Urinary Tract: No adrenal nodule or mass. Both kidneys are atrophic. Bladder is decompressed. Stomach/Bowel: Stomach is nondistended. No gastric wall thickening. No evidence of outlet obstruction. Duodenum is normally positioned as is the ligament of Treitz. No gross dilatation of small bowel. Bowel anastomosis identified anterior midline abdomen. There are bilateral ostomies, not well evaluated given the extensive body  wall edema and areas of fluid around the stoma on each side. On the previous study from 2013 , the patient appear to have a right lower quadrant loop ileostomy and a left lower quadrant end colostomy. There is a small volume of fluid associated with the right stoma on today's exam and a more prominent amount of fluid is seen and the left parastomal hernia. A collection of apparent stool and gas in the extreme low pelvis is similar to prior and presumably represents the patient's cecum, better demonstrated on the previous CT. Vascular/Lymphatic: Extensive calcification is noted and arterial anatomy. Upper normal retroperitoneal lymph nodes are evident. Reproductive: Uterus is apparently surgically absent. No definite adnexal mass. Other: Extensive body wall edema is noted. There is a small amount of free fluid around the liver. Diffuse edema is noted in the mesentery and there is free fluid in the lower pelvis. Musculoskeletal: Extensive pelvic  floor laxity is evident. IMPRESSION: 1. No evidence for overt bowel obstruction and no intraperitoneal free air is identified on today's study. Assessment is limited by the diffuse body wall and mesenteric edema, free fluid, lack of intravenous contrast and extensive postsurgical changes. Probable loop ileostomy right lower quadrant with sigmoid end colostomy left lower quadrant. There is some fluid in the left lower quadrant parastomal hernia, nonspecific. Free fluid is also noted in the anatomic pelvis. 2. Extensive vascular calcification. 3. Cholelithiasis. 4. Marked pelvic floor laxity. 5. No urinary bladder identified. Bladder may be markedly decompressed or surgically absent. Electronically Signed   By: Misty Stanley M.D.   On: 01/10/2017 17:31        Scheduled Meds: . aspirin  81 mg Oral Daily  . calcium acetate  2,001 mg Oral TID WC  . Chlorhexidine Gluconate Cloth  6 each Topical Q0600  . dorzolamide-timolol  1 drop Left Eye Daily  . heparin  5,000 Units Subcutaneous Q8H  . insulin aspart  0-5 Units Subcutaneous QHS  . insulin aspart  0-9 Units Subcutaneous TID WC  . metoprolol tartrate  50 mg Oral Q T,Th,S,Su  . mupirocin ointment  1 application Nasal BID  . ondansetron (ZOFRAN) IV  4 mg Intravenous Once  . simvastatin  10 mg Oral QPM  . sodium chloride flush  3 mL Intravenous Q12H   Continuous Infusions: . sodium chloride    . piperacillin-tazobactam (ZOSYN)  IV Stopped (01/11/17 1325)     LOS: 1 day    Time spent: 40 miintues    Edwin Dada, MD [facility] 01/11/2017, 5:07 PM     Pager [phone] --- please page though AMION:  [email or web address] If 7PM-7AM, please contact night-coverage

## 2017-01-11 NOTE — Consult Note (Signed)
[facility] Renal Consultation Note    Indication for Consultation:  Management of ESRD/hemodialysis, anemia, hypertension/volume, and secondary hyperparathyroidism. PCP:  HPI: [name] is a 72 y.o. female with ESRD, Type 2 DM, HTN, CAD (s/p stent), CM (EF 35%), Hx colon cancer (s/p resection/colostomy) & diverting urostomy (s/p bladder removal?) who was admitted with concern for intra-abdominal infection.  S/p ED visit on 12/30 after being sent from HD with hematemesis. Symptoms resolved in ED. Labs ok, but noted to have small ulceration on R foot. Prescribed Bactrim an Augmentin. Presented again yesterday to ED from dialysis with more vomiting and ?CP. Abdominal CT showing ?diffuse mesenteric edema. No free air. Labs showed K 3.4, Alb 2.4, trop 0.08, LA 2.94, WBC 18.5 and Hgb 11.1. She was admitted for concern of intraabdominal infection, started on Zosyn.  Today, says she feels "terrible." Denies CP, dyspnea. No further vomiting this morning. Denies fever or chills. No specific abdominal pain or changes in stool in colostomy bag. Has a small wound to plantar surface of R foot, no active drainage. Says has been there for weeks.  From dialysis standpoint, dialyzed partially on 01/10/17. Usually MWF schedule at [facility]. She will be due for HD again tomorrow. Using AVF without recent issues.  Past Medical History:  Diagnosis Date  . Acute respiratory failure with hypoxia ([place]) 10/23/2014  . Adenomatous duodenal polyp   . Anemia   . Arthritis    "hands" (11/17/2015)  . Atrial tachycardia ([place])    on amiod  . Cardiomyopathy- mixed   . CHF (congestive heart failure) ([place])   . Chronic diastolic heart failure ([place])   . Colon cancer ([place]) 1986  . Colostomy in place [facility])   . Coronary artery disease    Previously decreased EF; echo 113 normal LV function  . Dialysis patient ([place])   . ESRD (end stage renal disease) on dialysis [facility])    Dr Dunham/Dr. Lyda Kalata.  M,  W, Fr; [place] (11/17/2015)  . GERD (gastroesophageal reflux disease)   . Glaucoma   . Heart murmur   . Hernia, incisional    abd  . Hyperlipidemia   . Hypertension   . Hypertensive heart disease    sees Dr. Alain Marion  . LBBB (left bundle branch block)   . Mitral valve insufficiency and aortic valve insufficiency   . Obesity   . Stroke [facility])    2011/12  . Type II diabetes mellitus ([place])    Past Surgical History:  Procedure Laterality Date  . ARTERIOVENOUS GRAFT PLACEMENT Left 2010   "forearm"  . BALLOON DILATION N/A 08/17/2016   Procedure: POSSIBLE BALLOON DILATION POSSIBLE MALONEY;  Surgeon: Gatha Mayer, MD;  Location: [facility] ENDOSCOPY;  Service: Endoscopy;  Laterality: N/A;  . CARDIAC CATHETERIZATION    . CARDIAC CATHETERIZATION N/A 10/27/2014   Procedure: Left Heart Cath and Coronary Angiography;  Surgeon: Leonie Man, MD;  Location: [place] CV LAB;  Service: Cardiovascular;  Laterality: N/A;  . CARDIAC CATHETERIZATION N/A 10/28/2014   Procedure: Coronary Stent Intervention;  Surgeon: Peter M Martinique, MD;  Location: [place] CV LAB;  Service: Cardiovascular;  Laterality: N/A;  . CARDIAC CATHETERIZATION Bilateral 12/2008   R. heart cath showed elevated left and right heart filling pressures w/ pulmonary artery pressure elevated mildly out of proportion to the wedge. The left heart cath showed diffuse distal vessel disease as well as a 75% stenosis in the mid circumflex w/ a 90% stenosis of the ostial first obtuse  marginal. These lesions were in close proximity. there was a 60-70%mild RCA stenosis.   [name] CATARACT EXTRACTION W/ INTRAOCULAR LENS  IMPLANT, BILATERAL Bilateral   . COLON SURGERY    . COLONOSCOPY    . COLOSTOMY  1986  . ELECTROCARDIOGRAM  04-27-06  . ELECTROPHYSIOLOGIC STUDY N/A 11/17/2015   Procedure: A-Tach Ablation;  Surgeon: Will Meredith Leeds, MD;  Location: [place] CV LAB;  Service: Cardiovascular;  Laterality: N/A;  . ESOPHAGOGASTRODUODENOSCOPY   03-18-04  . ESOPHAGOGASTRODUODENOSCOPY N/A 02/02/2013   Procedure: ESOPHAGOGASTRODUODENOSCOPY (EGD);  Surgeon: Irene Shipper, MD;  Location: [facility] ENDOSCOPY;  Service: Endoscopy;  Laterality: N/A;  . ESOPHAGOGASTRODUODENOSCOPY (EGD) WITH PROPOFOL N/A 08/17/2016   Procedure: ESOPHAGOGASTRODUODENOSCOPY (EGD) WITH PROPOFOL;  Surgeon: Gatha Mayer, MD;  [facility];  Service: Endoscopy;  Laterality: N/A;   NO XRAY NEEDED  . EYE SURGERY    . FOOT AMPUTATION THROUGH METATARSAL  10-07-10   Right foot transmetatarsal  . INSERTION OF AHMED VALVE Right 08/20/2013   Procedure: INSERTION OF AHMED VALVE WITH Mitomycin C application;  Surgeon: Marylynn Pearson, MD;  Location: [place];  Service: Ophthalmology;  Laterality: Right;  . INSERTION OF AHMED VALVE Left 07/22/2014   Procedure: INSERTION OF AHMED VALVE WITH [place];  Surgeon: Marylynn Pearson, MD;  Location: [place];  Service: Ophthalmology;  Laterality: Left;  . LEFT HEART CATH AND CORONARY ANGIOGRAPHY N/A 08/18/2016   Procedure: LEFT HEART CATH AND CORONARY ANGIOGRAPHY;  Surgeon: Martinique, Peter M, MD;  Location: [place] CV LAB;  Service: Cardiovascular;  Laterality: N/A;  . MITOMYCIN C APPLICATION Left 2/99/2426   Procedure: MITOMYCIN C APPLICATION;  Surgeon: Marylynn Pearson, MD;  Location: [place];  Service: Ophthalmology;  Laterality: Left;  . PARS PLANA VITRECTOMY  11/29/2011   Procedure: PARS PLANA VITRECTOMY WITH 23 GAUGE;  Surgeon: Adonis Brook, MD;  Location: [place];  Service: Ophthalmology;  Laterality: Right;  Right Eye 23 ga vitrectomy with membrane peel  . PARS PLANA VITRECTOMY Left 02/28/2012   Procedure: PARS PLANA VITRECTOMY WITH 23 GAUGE;  Surgeon: Adonis Brook, MD;  Location: [place];  Service: Ophthalmology;  Laterality: Left;  . REVISION UROSTOMY CUTANEOUS    . SUPRAVENTRICULAR TACHYCARDIA ABLATION  11/17/2015  . VAGINAL HYSTERECTOMY     Family History  Problem Relation Age of Onset  . Hypertension Mother   . Coronary artery disease  Mother   . Hypertension Father   . Diabetes Father   . Cancer Sister        colon  . Hypertension Other    Social History:  reports that  has never smoked. she has never used smokeless tobacco. She reports that she does not drink alcohol or use drugs.  ROS: As per HPI otherwise negative.  Physical Exam: Vitals:   01/10/17 2236 01/11/17 0016 01/11/17 0628 01/11/17 0900  BP: (!) 98/48 (!) 93/41 (!) 108/48 (!) 100/50  Pulse: 77 72 77 80  Resp: 18 14 15 16   Temp: 98.3 F (36.8 C) 98.8 F (37.1 C) 98.3 F (36.8 C) 98.6 F (37 C)  TempSrc:    Oral  SpO2: 97% 97% 98% 99%  Weight:  72.4 kg (159 lb 9.8 oz)    Height:  5' (1.524 m)       General: Well developed, well nourished, in no acute distress. Head: Normocephalic, atraumatic, sclera non-icteric, mucus membranes are moist. Neck: Supple without lymphadenopathy/masses. JVD not elevated. Lungs: Clear bilaterally to auscultation without wheezes, rales, or rhonchi. Breathing is unlabored. Heart: RRR  with normal S1, S2. No murmurs, rubs, or gallops appreciated. Abdomen: Soft, non-tender, non-distended with normoactive bowel sounds. Colostomy bag in LLQ, normal appearing stool present. Urostomy bag in RLQ without any urine present. Musculoskeletal:  Strength and tone appear normal for age. Lower extremities: R TMA with ulcer present without drainage, trace LE edema Neuro: Alert and oriented X 3. Moves all extremities spontaneously. Psych:  Responds to questions appropriately with a normal affect. Dialysis Access: AVF + thrill  Allergies  Allergen Reactions  . Ace Inhibitors Cough  . Eggs Or Egg-Derived Products Nausea And Vomiting  . Enalapril Cough  . Lisinopril Cough  . Omnipaque [Iohexol] Hives   Prior to Admission medications   Medication Sig Start Date End Date Taking? Authorizing Provider  acetaminophen (TYLENOL) 500 MG tablet Take 500-1,000 mg by mouth every 6 (six) hours as needed (pain).    Yes [provider]   albuterol (PROVENTIL HFA;VENTOLIN HFA) 108 (90 Base) MCG/ACT inhaler Inhale 1-2 puffs into the lungs every 6 (six) hours as needed for wheezing or shortness of breath.   Yes [provider]  amoxicillin-clavulanate (AUGMENTIN) 875-125 MG tablet Take 1 tablet by mouth 2 (two) times daily for 14 days. 01/07/17 01/21/17 Yes Couture, Cortni S, PA-C  aspirin 81 MG tablet Take 1 tablet (81 mg total) by mouth daily. 10/29/14  Yes Dhungel, Nishant, MD  calcium acetate (PHOSLO) 667 MG capsule Take 2,001-2,668 mg by mouth See admin instructions. 2,001-2,668 mg three times a day with meals   Yes [provider]  Calcium Carbonate Antacid (ALKA-SELTZER ANTACID PO) Take 2 tablets by mouth daily as needed (for indigestion).    Yes [provider]  dorzolamide-timolol (COSOPT) 22.3-6.8 MG/ML ophthalmic solution Place 1 drop into the left eye daily.  11/09/15  Yes [provider]  lidocaine-prilocaine (EMLA) cream Apply 1 application topically as needed (topical anesthesia for hemodialysis if Gebauers and Lidocaine injection are ineffective.). 08/20/16  Yes Allie Bossier, MD  metoprolol tartrate (LOPRESSOR) 50 MG tablet Take 1 tablet (50 mg total) by mouth every Tuesday, Thursday, Saturday, and Sunday. 08/20/16  Yes Allie Bossier, MD  silver sulfADIAZINE (SILVADENE) 1 % cream Apply 1 application topically See admin instructions. 1 application to right foot once a day 12/29/16  Yes [provider]  simvastatin (ZOCOR) 10 MG tablet Take 1 tablet (10 mg total) by mouth every evening. Patient taking differently: Take 10 mg by mouth daily.  05/23/16  Yes Camnitz, Will Hassell Done, MD  sulfamethoxazole-trimethoprim (BACTRIM) 400-80 MG tablet Please take 2 tablets tomorrow on 12/31.  Then continue to take 1 table per day for the next 13 days.  On dialysis days, take the tablet after dialysis. Patient taking differently: Take 1 tablet by mouth See admin instructions. 1 tablet once a day  and take AFTER dialysis on "dialysis days" 01/07/17  Yes Couture, Cortni S, PA-C  glucose blood (ONE TOUCH ULTRA TEST) test strip 1 each by Other route daily. And lancets 1/day 03/16/16   Renato Shin, MD  nitroGLYCERIN (NITROSTAT) 0.4 MG SL tablet Place 1 tablet (0.4 mg total) under the tongue every 5 (five) minutes as needed for chest pain. Patient not taking: Reported on 01/10/2017 10/29/14   Dhungel, Flonnie Overman, MD  pantoprazole (PROTONIX) 40 MG tablet Take 1 tablet (40 mg total) by mouth daily before breakfast. Patient not taking: Reported on 01/10/2017 07/25/16   Gatha Mayer, MD   Current Facility-Administered Medications  Medication Dose Route Frequency Provider Last Rate Last Dose  .  0.9 %  sodium chloride infusion  250 mL Intravenous PRN Merton Border, MD      . albuterol (PROVENTIL) (2.5 MG/3ML) 0.083% nebulizer solution 2.5 mg  2.5 mg Nebulization Q6H PRN Merton Border, MD      . aspirin chewable tablet 81 mg  81 mg Oral Daily Merton Border, MD   81 mg at 01/11/17 0926  . calcium acetate (PHOSLO) capsule 2,001 mg  2,001 mg Oral TID WC Merton Border, MD      . Chlorhexidine Gluconate Cloth 2 % PADS 6 each  6 each Topical Q0600 Merton Border, MD   6 each at 01/11/17 [phone]  . dorzolamide-timolol (COSOPT) 22.3-6.8 MG/ML ophthalmic solution 1 drop  1 drop Left Eye Daily Merton Border, MD   1 drop at 01/11/17 0926  . heparin injection 5,000 Units  5,000 Units Subcutaneous Q8H Merton Border, MD   5,000 Units at 01/11/17 [phone]  . insulin aspart (novoLOG) injection 0-5 Units  0-5 Units Subcutaneous QHS Merton Border, MD      . insulin aspart (novoLOG) injection 0-9 Units  0-9 Units Subcutaneous TID WC Merton Border, MD      . metoprolol tartrate (LOPRESSOR) tablet 50 mg  50 mg Oral Q T,Th,S,Su Merton Border, MD   50 mg at 01/11/17 0926  . mupirocin ointment (BACTROBAN) 2 % 1 application  1 application Nasal BID Merton Border, MD   1 application at 34/19/62 [phone]  . ondansetron (ZOFRAN) injection 4 mg  4 mg Intravenous Once  Kirichenko, Tatyana, PA-C      . ondansetron (ZOFRAN) tablet 4 mg  4 mg Oral Q6H PRN Merton Border, MD       Or  . ondansetron (ZOFRAN) injection 4 mg  4 mg Intravenous Q6H PRN Merton Border, MD      . piperacillin-tazobactam (ZOSYN) IVPB 3.375 g  3.375 g Intravenous Q12H Rumbarger, Rachel L, RPH 12.5 mL/hr at 01/11/17 0925 3.375 g at 01/11/17 0925  . simvastatin (ZOCOR) tablet 10 mg  10 mg Oral QPM Merton Border, MD   10 mg at 01/10/17 2152  . sodium chloride flush (NS) 0.9 % injection 3 mL  3 mL Intravenous Q12H Merton Border, MD   3 mL at 01/10/17 2201  . sodium chloride flush (NS) 0.9 % injection 3 mL  3 mL Intravenous PRN Merton Border, MD      . zolpidem (AMBIEN) tablet 5 mg  5 mg Oral QHS PRN Merton Border, MD       Facility-Administered Medications Ordered in Other Encounters  Medication Dose Route Frequency Provider Last Rate Last Dose  . mitoMYcin (MUTAMYCIN) Injection Use in OR only (0.4 mg/ml)  0.5 mL Left Eye Once Marylynn Pearson, MD       Labs: Basic Metabolic Panel: Recent Labs  Lab 01/07/17 1555 01/10/17 1625 01/10/17 1638  NA 137 137 140  K 3.1* 3.4* 3.1*  CL 97* 98* 97*  CO2 26 26  --   GLUCOSE 155* 142* 151*  BUN 24* 28* 29*  CREATININE 3.82* 4.40* 4.20*  CALCIUM 8.4* 8.1*  --    Liver Function Tests: Recent Labs  Lab 01/07/17 1555 01/10/17 1625  AST 17 17  ALT 10* <5*  ALKPHOS 140* 185*  BILITOT 1.4* 0.5  PROT 6.5 6.3*  ALBUMIN 2.6* 2.4*   Recent Labs  Lab 01/10/17 1625  LIPASE 23   CBC: Recent Labs  Lab 01/07/17 1555 01/10/17 1625 01/10/17 1638  WBC 14.5* 18.5*  --   NEUTROABS  --  17.0*  --   HGB 12.2 11.1* 13.3  HCT 38.0 35.1* 39.0  MCV 86.8 83.4  --   PLT 207 237  --    CBG: Recent Labs  Lab 01/10/17 2151 01/10/17 2341 01/11/17 0740  GLUCAP 148* 123* 99   Studies/Results: Ct Abdomen Pelvis Wo Contrast  Result Date: 01/10/2017 CLINICAL DATA:  Back pain with nausea and vomiting. EXAM: CT ABDOMEN AND PELVIS WITHOUT CONTRAST TECHNIQUE:  Multidetector CT imaging of the abdomen and pelvis was performed following the standard protocol without IV contrast. COMPARISON:  05/21/2011 FINDINGS: Lower chest:  Cardiomegaly.  Coronary artery calcification evident. Hepatobiliary: Liver measures 19.4 cm craniocaudal length, enlarged. Calcified stones are seen in the gallbladder. No intrahepatic or extrahepatic biliary dilation. Pancreas: Pancreas is atrophic. Spleen: Similar appearance of the irregular 4.7 cm splenic lesion. Adrenals/Urinary Tract: No adrenal nodule or mass. Both kidneys are atrophic. Bladder is decompressed. Stomach/Bowel: Stomach is nondistended. No gastric wall thickening. No evidence of outlet obstruction. Duodenum is normally positioned as is the ligament of Treitz. No gross dilatation of small bowel. Bowel anastomosis identified anterior midline abdomen. There are bilateral ostomies, not well evaluated given the extensive body wall edema and areas of fluid around the stoma on each side. On the previous study from 2013 , the patient appear to have a right lower quadrant loop ileostomy and a left lower quadrant end colostomy. There is a small volume of fluid associated with the right stoma on today's exam and a more prominent amount of fluid is seen and the left parastomal hernia. A collection of apparent stool and gas in the extreme low pelvis is similar to prior and presumably represents the patient's cecum, better demonstrated on the previous CT. Vascular/Lymphatic: Extensive calcification is noted and arterial anatomy. Upper normal retroperitoneal lymph nodes are evident. Reproductive: Uterus is apparently surgically absent. No definite adnexal mass. Other: Extensive body wall edema is noted. There is a small amount of free fluid around the liver. Diffuse edema is noted in the mesentery and there is free fluid in the lower pelvis. Musculoskeletal: Extensive pelvic floor laxity is evident. IMPRESSION: 1. No evidence for overt bowel  obstruction and no intraperitoneal free air is identified on today's study. Assessment is limited by the diffuse body wall and mesenteric edema, free fluid, lack of intravenous contrast and extensive postsurgical changes. Probable loop ileostomy right lower quadrant with sigmoid end colostomy left lower quadrant. There is some fluid in the left lower quadrant parastomal hernia, nonspecific. Free fluid is also noted in the anatomic pelvis. 2. Extensive vascular calcification. 3. Cholelithiasis. 4. Marked pelvic floor laxity. 5. No urinary bladder identified. Bladder may be markedly decompressed or surgically absent. Electronically Signed   By: Misty Stanley M.D.   On: 01/10/2017 17:31   Dialysis Orders:  MWF at [place], 400/A1.5, 3K/2Ca bath, EDW 72.5kg, AVF, no heparin - Calcitriol 26mcg PO q HD - Venofer 100mg  x 10 (ordered, not started yet)  Assessment/Plan: 1.  Leukocytosis /?abdominal infection: On Zosyn. UCx pending. 2.  ESRD: Usually MWF. No acute need for HD today despite only partial HD past 2 session, next 1/4. 3.  Hypertension/volume: BP controlled. At EDW per notes, follow. 4.  Anemia: Hgb 13.3. No ESA needed. 5.  Metabolic bone disease: Ca ok, Phos pending. Continue VDRA and binders. 6.  Nutrition: Alb 2.4, adding supplements. 7.  Type 2 DM: Per primary 8.  CAD/cardiomyopathy: Per primary.  Veneta Penton, PA-C 01/11/2017, 10:36 AM  [facility] Pager: [phone]  Renal Attending: She had some GI s/s described above with HD. This evening she is better. Abd CT findings appear nonspecific. Estanislado Emms

## 2017-01-11 NOTE — Consult Note (Signed)
   [facility] CM Inpatient Consult   01/11/2017  [name] 10/01/1945 [id number]  Patient screened for potential [place] Management services for admissions and ED visits. Patient has hemodialysis on MWF for ESRD includes but not limited to Diabetes Type 2. Patient is in the [place] [facility] [place] Management services under patient's Medicare plan. Came by to speak with patient but she was sound asleep even after calling her name. Will follow for progress and disposition needs.   Please place a [facility] Care Management consult or for questions contact:   Natividad Brood, RN BSN [facility] Liaison  [phone] business mobile phone Toll free office [phone]

## 2017-01-11 NOTE — Progress Notes (Signed)
New Admission Note:   Arrival Method:   Via stretcher from the ED Mental Orientation:  A & O x 4 Telemetry:   None Assessment: Completed Skin:  See skin assessment IV:  Right Forearm NSL Pain:   Denies Tubes:  N/A Safety Measures: Safety Fall Prevention Plan has been given, discussed and signed Admission: Completed [place] Orientation: Patient has been orientated to the room, unit and staff.  Family:  None at bedside - lives at home with daughter and niece  Patient's personal belongings are at bedside.  These include:  Cell phone, glasses, clothing, pocket book, and wallet.  She refuses anything to go to the safe.  She is  Aware that [facility] is not responsible for any missing items.  Orders have been reviewed and implemented. Will continue to monitor the patient. Call light has been placed within reach and bed alarm has been activated.   Earleen Reaper RN- BC, [facility] Phone number: [phone]

## 2017-01-11 NOTE — Care Management Note (Signed)
Case Management Note  Patient Details  Name: [name] MRN: [id number] Date of Birth: 1945/03/18  Subjective/Objective:  Sepsis, ESRD-HD                  Action/Plan: Discharge Planning: NCM spoke to pt and she lives at home with Dtr, [name]. Her dtr takes her to appts and HD. She has a RW at home. Will continue to follow for dc needs.   PCP Cassandria Anger MD  Expected Discharge Date:  01/14/17               Expected Discharge Plan:  Home/Self Care  In-House Referral:  NA  Discharge planning Services  CM Consult  Post Acute Care Choice:  NA Choice offered to:  NA  DME Arranged:  N/A DME Agency:  NA  HH Arranged:  NA HH Agency:  NA  Status of Service:  Completed, signed off  If discussed at [place] of Stay Meetings, dates discussed:    Additional Comments:  Erenest Rasher, RN 01/11/2017, 3:36 PM

## 2017-01-12 ENCOUNTER — Encounter: Payer: Medicare Other | Admitting: Vascular Surgery

## 2017-01-12 DIAGNOSIS — Z992 Dependence on renal dialysis: Secondary | ICD-10-CM

## 2017-01-12 DIAGNOSIS — N390 Urinary tract infection, site not specified: Secondary | ICD-10-CM

## 2017-01-12 LAB — BASIC METABOLIC PANEL
ANION GAP: 12 (ref 5–15)
BUN: 36 mg/dL — AB (ref 6–20)
CO2: 28 mmol/L (ref 22–32)
Calcium: 8.4 mg/dL — ABNORMAL LOW (ref 8.9–10.3)
Chloride: 98 mmol/L — ABNORMAL LOW (ref 101–111)
Creatinine, Ser: 5.58 mg/dL — ABNORMAL HIGH (ref 0.44–1.00)
GFR calc Af Amer: 8 mL/min — ABNORMAL LOW (ref >60)
GFR, EST NON AFRICAN AMERICAN: 7 mL/min — AB (ref >60)
GLUCOSE: 129 mg/dL — AB (ref 65–99)
POTASSIUM: 3.3 mmol/L — AB (ref 3.5–5.1)
Sodium: 138 mmol/L (ref 135–145)

## 2017-01-12 LAB — CBC
HEMATOCRIT: 34.4 % — AB (ref 36.0–46.0)
HEMOGLOBIN: 10.7 g/dL — AB (ref 12.0–15.0)
MCH: 26.4 pg (ref 26.0–34.0)
MCHC: 31.1 g/dL (ref 30.0–36.0)
MCV: 84.9 fL (ref 78.0–100.0)
Platelets: 277 10*3/uL (ref 150–400)
RBC: 4.05 MIL/uL (ref 3.87–5.11)
RDW: 16.5 % — AB (ref 11.5–15.5)
WBC: 10.5 10*3/uL (ref 4.0–10.5)

## 2017-01-12 LAB — URINE CULTURE

## 2017-01-12 LAB — GLUCOSE, CAPILLARY
GLUCOSE-CAPILLARY: 69 mg/dL (ref 65–99)
Glucose-Capillary: 122 mg/dL — ABNORMAL HIGH (ref 65–99)
Glucose-Capillary: 134 mg/dL — ABNORMAL HIGH (ref 65–99)
Glucose-Capillary: 188 mg/dL — ABNORMAL HIGH (ref 65–99)

## 2017-01-12 MED ORDER — SODIUM CHLORIDE 0.9 % IV SOLN: 100.0000 mL | INTRAVENOUS | Status: DC | PRN

## 2017-01-12 MED ORDER — HEPARIN SODIUM (PORCINE) 1000 UNIT/ML DIALYSIS: 1000.0000 [IU] | INTRAMUSCULAR | Status: DC | PRN

## 2017-01-12 MED ORDER — LIDOCAINE-PRILOCAINE 2.5-2.5 % EX CREA
1.0000 | TOPICAL_CREAM | CUTANEOUS | Status: DC | PRN
Start: 2017-01-12 — End: 2017-01-12

## 2017-01-12 MED ORDER — PENTAFLUOROPROP-TETRAFLUOROETH EX AERO: 1.0000 "application " | INHALATION_SPRAY | CUTANEOUS | Status: DC | PRN

## 2017-01-12 MED ORDER — ALTEPLASE 2 MG IJ SOLR: 2.0000 mg | Freq: Once | INTRAMUSCULAR | Status: DC | PRN

## 2017-01-12 MED ORDER — LIDOCAINE HCL (PF) 1 % IJ SOLN: 5.0000 mL | INTRAMUSCULAR | Status: DC | PRN

## 2017-01-12 MED ORDER — OXYCODONE HCL 5 MG PO TABS
2.5000 mg | ORAL_TABLET | Freq: Four times a day (QID) | ORAL | Status: DC | PRN
Start: 2017-01-12 — End: 2017-01-13
  Administered 2017-01-12: 5 mg via ORAL
  Filled 2017-01-12: qty 1

## 2017-01-12 MED ORDER — SODIUM CHLORIDE 0.9 % IV BOLUS (SEPSIS)
500.0000 mL | Freq: Once | INTRAVENOUS | Status: AC
  Administered 2017-01-12: 500 mL via INTRAVENOUS

## 2017-01-12 NOTE — Progress Notes (Signed)
PROGRESS NOTE    [name]  LGX:[id number] DOB: [birth year] DOA: 01/10/2017 PCP: Cassandria Anger, MD      Brief Narrative:  [name] is a 72 yo F with ESRD on HD, CHF EF 25%, DM, colon cancer status post colostomy, and recent PEA arrest who presents from dialysis with nausea, vomiting, and bilateral flank pain.  Urinalysis showed full field RBCs/WBCs, new nitrites.  Started on empiric antibiotics.   Assessment & Plan:  Active Problems:   Leukocytosis   Pyelonephritis Sepsis Lactate improved with fluids, currently hemodynamically stable.  CT abdomen nonspecific but symptoms and UA consistent with pyelonephritis. -Continue empiric antibiotics with Zosyn -Follow urine and blood cultures   ESRD -Consulted nephrology, appreciate cares -Continue PhosLo  CHF EF 25% No signs of fluid overload -Continue metoprolol, statin and aspirin  Diabetes -Continue SSI  Hypokalemia -Per HD      DVT prophylaxis: Heparin Code Status: FULL Family Communication: None present Disposition Plan: Empiric antibiotics, if culture negative, will narrow to cephalexin and discharge to complete short course.   Consultants:   Nephrology  Procedures:   None  Antimicrobials:   Zosyn 1/2 >>    Subjective: Feels much better.  No more vomiting, no more flank pain.  Feels near baseline.  No more fever, confusion.  No cough, dyspnea, sputum.  Objective: Vitals:   01/12/17 1230 01/12/17 1256 01/12/17 1355 01/12/17 1713  BP: (!) 109/58 (!) 98/43 105/60 (!) 116/58  Pulse: 70 78 77 (!) 106  Resp:  18 18 18   Temp:  98.2 F (36.8 C) 97.6 F (36.4 C) 98 F (36.7 C)  TempSrc:  Oral Oral Oral  SpO2:  98% 98% 98%  Weight:  73.4 kg (161 lb 13.1 oz)    Height:        Intake/Output Summary (Last 24 hours) at 01/12/2017 1830 Last data filed at 01/12/2017 1400 Gross per 24 hour  Intake 410 ml  Output 1465 ml  Net -1055 ml   Filed Weights   01/11/17 2104 01/12/17 0840 01/12/17  1256  Weight: 72.6 kg (160 lb 0.9 oz) 73.3 kg (161 lb 9.6 oz) 73.4 kg (161 lb 13.1 oz)    Examination: General appearance: adult female, alert and in no acute distress.   HEENT: Anicteric, conjunctiva pink, lids and lashes normal. No nasal deformity, discharge, epistaxis.  Lips moist.   Skin: Warm and dry.  No suspicious rashes or lesions. Cardiac: RRR, nl S1-S2, no murmurs appreciated.  Capillary refill is brisk.  JVP normal.  No LE edema.  Radial pulses 2+ and symmetric. Respiratory: Normal respiratory rate and rhythm.  CTAB without rales or wheezes. Abdomen: Abdomen soft.  No TTP. No ascites, distension, hepatosplenomegaly.   MSK: No deformities or effusions.  BKA. Neuro: Awake and alert.  EOMI, moves all extremities. Speech fluent.    Psych: Sensorium intact and responding to questions, attention normal. Affect normal.  Judgment and insight appear normal.    Data Reviewed: I have personally reviewed following labs and imaging studies:  CBC: Recent Labs  Lab 01/07/17 1555 01/10/17 1625 01/10/17 1638 01/12/17 0609  WBC 14.5* 18.5*  --  10.5  NEUTROABS  --  17.0*  --   --   HGB 12.2 11.1* 13.3 10.7*  HCT 38.0 35.1* 39.0 34.4*  MCV 86.8 83.4  --  84.9  PLT 207 237  --  814   Basic Metabolic Panel: Recent Labs  Lab 01/07/17 1555 01/10/17 1625 01/10/17 1638 01/11/17 0948 01/12/17 4818  NA 137 137 140 137 138  K 3.1* 3.4* 3.1* 3.3* 3.3*  CL 97* 98* 97* 99* 98*  CO2 26 26  --  21* 28  GLUCOSE 155* 142* 151* 189* 129*  BUN 24* 28* 29* 33* 36*  CREATININE 3.82* 4.40* 4.20* 5.00* 5.58*  CALCIUM 8.4* 8.1*  --  8.1* 8.4*   GFR: Estimated Creatinine Clearance: 8.3 mL/min (A) (by C-G formula based on SCr of 5.58 mg/dL (H)). Liver Function Tests: Recent Labs  Lab 01/07/17 1555 01/10/17 1625  AST 17 17  ALT 10* <5*  ALKPHOS 140* 185*  BILITOT 1.4* 0.5  PROT 6.5 6.3*  ALBUMIN 2.6* 2.4*   Recent Labs  Lab 01/10/17 1625  LIPASE 23   No results for input(s):  AMMONIA in the last 168 hours. Coagulation Profile: Recent Labs  Lab 01/07/17 1555  INR 1.23   Cardiac Enzymes: No results for input(s): CKTOTAL, CKMB, CKMBINDEX, TROPONINI in the last 168 hours. BNP (last 3 results) No results for input(s): PROBNP in the last 8760 hours. HbA1C: No results for input(s): HGBA1C in the last 72 hours. CBG: Recent Labs  Lab 01/11/17 1651 01/11/17 2103 01/12/17 0725 01/12/17 1352 01/12/17 1633  GLUCAP 156* 172* 122* 69 134*   Lipid Profile: No results for input(s): CHOL, HDL, LDLCALC, TRIG, CHOLHDL, LDLDIRECT in the last 72 hours. Thyroid Function Tests: No results for input(s): TSH, T4TOTAL, FREET4, T3FREE, THYROIDAB in the last 72 hours. Anemia Panel: No results for input(s): VITAMINB12, FOLATE, FERRITIN, TIBC, IRON, RETICCTPCT in the last 72 hours. Urine analysis:    Component Value Date/Time   COLORURINE AMBER (A) 01/10/2017 2024   APPEARANCEUR TURBID (A) 01/10/2017 2024   LABSPEC 1.011 01/10/2017 2024   PHURINE 8.0 01/10/2017 2024   GLUCOSEU 150 (A) 01/10/2017 2024   HGBUR MODERATE (A) 01/10/2017 2024   BILIRUBINUR NEGATIVE 01/10/2017 2024   KETONESUR NEGATIVE 01/10/2017 2024   PROTEINUR 100 (A) 01/10/2017 2024   UROBILINOGEN 4.0 (H) 05/20/2011 2219   NITRITE POSITIVE (A) 01/10/2017 2024   LEUKOCYTESUR LARGE (A) 01/10/2017 2024   Sepsis Labs: @LABRCNTIP (procalcitonin:4,lacticacidven:4)  ) Recent Results (from the past 240 hour(s))  Urine culture     Status: Abnormal   Collection Time: 01/10/17  7:25 PM  Result Value Ref Range Status   Specimen Description URINE, RANDOM  Final   Special Requests NONE  Final   Culture <10,000 COLONIES/mL INSIGNIFICANT GROWTH (A)  Final   Report Status 01/12/2017 FINAL  Final  MRSA PCR Screening     Status: Abnormal   Collection Time: 01/11/17 12:01 AM  Result Value Ref Range Status   MRSA by PCR POSITIVE (A) NEGATIVE Final    Comment:        The GeneXpert MRSA Assay (FDA approved for NASAL  specimens only), is one component of a comprehensive MRSA colonization surveillance program. It is not intended to diagnose MRSA infection nor to guide or monitor treatment for MRSA infections. RESULT CALLED TO, READ BACK BY AND VERIFIED WITH: Wanda [id number] 01/11/17 A BROWNING          Radiology Studies: No results found.      Scheduled Meds: . aspirin  81 mg Oral Daily  . calcium acetate  2,001 mg Oral TID WC  . Chlorhexidine Gluconate Cloth  6 each Topical Q0600  . dorzolamide-timolol  1 drop Left Eye Daily  . heparin  5,000 Units Subcutaneous Q8H  . insulin aspart  0-5 Units Subcutaneous QHS  . insulin aspart  0-9  Units Subcutaneous TID WC  . metoprolol tartrate  50 mg Oral Q T,Th,S,Su  . mupirocin ointment  1 application Nasal BID  . ondansetron (ZOFRAN) IV  4 mg Intravenous Once  . simvastatin  10 mg Oral QPM  . sodium chloride flush  3 mL Intravenous Q12H   Continuous Infusions: . sodium chloride    . piperacillin-tazobactam (ZOSYN)  IV 3.375 g (01/12/17 1403)     LOS: 2 days    Time spent: 20 miintues    Edwin Dada, MD [facility] 01/12/2017, 6:30 PM     Pager [phone] --- please page though AMION:  [email or web address] If 7PM-7AM, please contact night-coverage

## 2017-01-12 NOTE — Procedures (Signed)
Stable hemodynamics, feels well this AM. Goal 1500, BP 100/52 BFR 400cc/min WBC 10500, Hgb 10.7gm  [name]

## 2017-01-12 NOTE — Consult Note (Signed)
   [facility] CM Inpatient Consult   01/12/2017  MARLIES LIGMAN Sep 05, 1945 [id number]   Referral received for post hospital follow up in the Medicare ACO for multiple admissions and ED visits.  Came by earlier but patient was off the unit.  Met with the patient at the bedside.  She was resting in bed and states she had dialysis earlier.  She states her daughter and her niece lives with her and she has a person who comes on her dialysis days to assist her to get ready for dialysis [MWF] schedule.  Patient with a recent PEA arrest.  Patient states she has some DME needs will let inpatient RNCM know of request.  Explained [facility] Care Management services. Her primary care provider is Dr. Tyrone Apple Plotnikov, [place].  Patient states she desires some follow up with [facility] Care Management. She is noted as high risk for hospitalizations.  Will have Ila for follow up.  Natividad Brood, RN BSN [facility] Liaison  [phone] business mobile phone Toll free office [phone]

## 2017-01-13 DIAGNOSIS — D631 Anemia in chronic kidney disease: Secondary | ICD-10-CM

## 2017-01-13 DIAGNOSIS — I110 Hypertensive heart disease with heart failure: Secondary | ICD-10-CM

## 2017-01-13 DIAGNOSIS — Z89432 Acquired absence of left foot: Secondary | ICD-10-CM

## 2017-01-13 DIAGNOSIS — I5032 Chronic diastolic (congestive) heart failure: Secondary | ICD-10-CM

## 2017-01-13 DIAGNOSIS — N184 Chronic kidney disease, stage 4 (severe): Secondary | ICD-10-CM

## 2017-01-13 DIAGNOSIS — E1122 Type 2 diabetes mellitus with diabetic chronic kidney disease: Secondary | ICD-10-CM

## 2017-01-13 LAB — BASIC METABOLIC PANEL
ANION GAP: 16 — AB (ref 5–15)
BUN: 21 mg/dL — ABNORMAL HIGH (ref 6–20)
CALCIUM: 8.1 mg/dL — AB (ref 8.9–10.3)
CHLORIDE: 94 mmol/L — AB (ref 101–111)
CO2: 24 mmol/L (ref 22–32)
Creatinine, Ser: 3.7 mg/dL — ABNORMAL HIGH (ref 0.44–1.00)
GFR calc Af Amer: 13 mL/min — ABNORMAL LOW (ref >60)
GFR calc non Af Amer: 11 mL/min — ABNORMAL LOW (ref >60)
GLUCOSE: 179 mg/dL — AB (ref 65–99)
Potassium: 3.9 mmol/L (ref 3.5–5.1)
Sodium: 134 mmol/L — ABNORMAL LOW (ref 135–145)

## 2017-01-13 LAB — CBC
HEMATOCRIT: 37.6 % (ref 36.0–46.0)
HEMOGLOBIN: 11.7 g/dL — AB (ref 12.0–15.0)
MCH: 26.7 pg (ref 26.0–34.0)
MCHC: 31.1 g/dL (ref 30.0–36.0)
MCV: 85.6 fL (ref 78.0–100.0)
Platelets: 290 10*3/uL (ref 150–400)
RBC: 4.39 MIL/uL (ref 3.87–5.11)
RDW: 16.6 % — ABNORMAL HIGH (ref 11.5–15.5)
WBC: 10.9 10*3/uL — ABNORMAL HIGH (ref 4.0–10.5)

## 2017-01-13 LAB — GLUCOSE, CAPILLARY
GLUCOSE-CAPILLARY: 184 mg/dL — AB (ref 65–99)
GLUCOSE-CAPILLARY: 217 mg/dL — AB (ref 65–99)

## 2017-01-13 MED ORDER — ONDANSETRON HCL 4 MG PO TABS: 4.0000 mg | ORAL_TABLET | Freq: Four times a day (QID) | ORAL | 0 refills | Status: AC | PRN

## 2017-01-13 MED ORDER — CEFPODOXIME PROXETIL 200 MG PO TABS: 400.0000 mg | ORAL_TABLET | ORAL | 0 refills | Status: DC

## 2017-01-13 MED ORDER — CEFPODOXIME PROXETIL 200 MG PO TABS
400.0000 mg | ORAL_TABLET | ORAL | Status: DC
  Administered 2017-01-13: 400 mg via ORAL
  Filled 2017-01-13: qty 2

## 2017-01-13 NOTE — Progress Notes (Signed)
[name] Progress Note   Subjective: Seen in room. Feels much better this morning. No c/os. Says going home today.   Objective Vitals:   01/12/17 1713 01/12/17 2206 01/13/17 0112 01/13/17 0614  BP: (!) 116/58 (!) 86/37 (!) 117/47 (!) 103/57  Pulse: (!) 106 75  67  Resp: 18 19  18   Temp: 98 F (36.7 C) 98.3 F (36.8 C)  98 F (36.7 C)  TempSrc: Oral Oral  Oral  SpO2: 98% 99%  99%  Weight:  73.4 kg (161 lb 13.1 oz)    Height:       Physical Exam General: WNWD elderly female NAD Heart: RRR  Lungs: CTAB Abdomen: soft NT/ND Extremities: s/p R TMA no LE edema  Dialysis Access: LUE AVF +bruit   Dialysis Orders:  MWF at [facility] 4hr, 400/A1.5, 3K/2Ca bath, EDW 72.5kg, AVF, no heparin - Calcitriol 70mcg PO q HD - Venofer 100mg  x 10 (ordered, not started yet)   Assessment/Plan: 1. Abd pain/UTI-  +UA, nonspecific abd CT, leukocytosis improving on IV Zoysn  2. ESRD - MWF. Continue on schedule. Next HD 1/7 3. Anemia - Hgb 11.7 No ESA  4. MBD- Continue Calcitriol/PhosLo binder 5. HTN/volume - BP controlled/Volume stable Post HD wt  1/4 3.4kg  6. Nutrition - Renal diet/vitamins/pro supplements 7. DM 8. CAD/CM   Cynthia Child PA-C [facility] Pager [phone] 01/13/2017,10:05 AM  LOS: 3 days   Renal Attending; Stable and much improved.  I agree with note above. [name]      Additional Objective Labs: Basic Metabolic Panel: Recent Labs  Lab 01/11/17 0948 01/12/17 0609 01/13/17 0632  NA 137 138 134*  K 3.3* 3.3* 3.9  CL 99* 98* 94*  CO2 21* 28 24  GLUCOSE 189* 129* 179*  BUN 33* 36* 21*  CREATININE 5.00* 5.58* 3.70*  CALCIUM 8.1* 8.4* 8.1*   CBC: Recent Labs  Lab 01/07/17 1555 01/10/17 1625 01/10/17 1638 01/12/17 0609 01/13/17 0632  WBC 14.5* 18.5*  --  10.5 10.9*  NEUTROABS  --  17.0*  --   --   --   HGB 12.2 11.1* 13.3 10.7* 11.7*  HCT 38.0 35.1* 39.0 34.4* 37.6  MCV 86.8 83.4  --  84.9 85.6  PLT 207 237   --  277 290   Blood Culture    Component Value Date/Time   SDES URINE, RANDOM 01/10/2017 1925   SPECREQUEST NONE 01/10/2017 1925   CULT <10,000 COLONIES/mL INSIGNIFICANT GROWTH (A) 01/10/2017 1925   REPTSTATUS 01/12/2017 FINAL 01/10/2017 1925    Cardiac Enzymes: No results for input(s): CKTOTAL, CKMB, CKMBINDEX, TROPONINI in the last 168 hours. CBG: Recent Labs  Lab 01/12/17 0725 01/12/17 1352 01/12/17 1633 01/12/17 2205 01/13/17 0744  GLUCAP 122* 69 134* 188* 184*   Iron Studies: No results for input(s): IRON, TIBC, TRANSFERRIN, FERRITIN in the last 72 hours. Lab Results  Component Value Date   INR 1.23 01/07/2017   INR 1.13 08/18/2016   INR 1.19 10/24/2014   Medications: . sodium chloride    . piperacillin-tazobactam (ZOSYN)  IV Stopped (01/13/17 0248)   . aspirin  81 mg Oral Daily  . calcium acetate  2,001 mg Oral TID WC  . Chlorhexidine Gluconate Cloth  6 each Topical Q0600  . dorzolamide-timolol  1 drop Left Eye Daily  . heparin  5,000 Units Subcutaneous Q8H  . insulin aspart  0-5 Units Subcutaneous QHS  . insulin aspart  0-9 Units Subcutaneous TID WC  . metoprolol  tartrate  50 mg Oral Q T,Th,S,Su  . mupirocin ointment  1 application Nasal BID  . ondansetron (ZOFRAN) IV  4 mg Intravenous Once  . simvastatin  10 mg Oral QPM  . sodium chloride flush  3 mL Intravenous Q12H

## 2017-01-13 NOTE — Evaluation (Signed)
Physical Therapy Evaluation Patient Details Name: [name] MRN: [id number] DOB: 08/04/1945 Today's Date: 01/13/2017   History of Present Illness  Pt is a 72 y/o female admitted secondary to nausea, vomitting, lateral flank pain following HD. Pt found to have pyelonephritis. PMH including but not limited to ESRD, colon cancer s/p colostomy, R transmet amputation in 2012, CHF and CAD.    Clinical Impression  Pt presented supine in bed with HOB elevated, awake and willing to participate in therapy session. Prior to admission, pt reported that she ambulated short distances within her home with use of RW and required assistance from family or her caregiver for ADLs. Pt lives with her daughter and niece with someone available 24/7 to assist pt. Pt currently very limited secondary to fatigue, weakness and R foot pain. Pt performed sit<>stand with min A with RW. Pt was only able to tolerate take a few small side steps at EOB with RW. Pt with posterior lean throughout requiring min A to maintain upright position. Pt would continue to benefit from skilled physical therapy services at this time while admitted and after d/c to address the below listed limitations in order to improve overall safety and independence with functional mobility.     Follow Up Recommendations Home health PT;Supervision/Assistance - 24 hour;Other (comment)(pt adamant to return home, but would benefit from [facility])    Equipment Recommendations  Wheelchair (measurements PT);Wheelchair cushion (measurements PT)    Recommendations for Other Services       Precautions / Restrictions Precautions Precautions: Fall Restrictions Weight Bearing Restrictions: No      Mobility  Bed Mobility Overal bed mobility: Needs Assistance Bed Mobility: Supine to Sit;Sit to Supine     Supine to sit: Mod assist Sit to supine: Mod assist   General bed mobility comments: increased time and effort, use of bed rails, mod A to elevate  trunk and with bilateral LEs to return to bed  Transfers Overall transfer level: Needs assistance Equipment used: Rolling walker (2 wheeled) Transfers: Sit to/from Stand Sit to Stand: Min assist         General transfer comment: increased time and effort, cueing for safe hand placement, min A for stability with transition  Ambulation/Gait             General Gait Details: pt only able to take 4-5 small side steps towards HOB with min A. pt with posterior lean throughout and very unsteady with RW  Stairs            Wheelchair Mobility    Modified Rankin (Stroke Patients Only)       Balance Overall balance assessment: Needs assistance Sitting-balance support: Feet supported Sitting balance-Leahy Scale: Poor Sitting balance - Comments: initially required min-mod A to maintain upright sitting EOB, progressed to min guard Postural control: Posterior lean;Left lateral lean Standing balance support: During functional activity;Bilateral upper extremity supported Standing balance-Leahy Scale: Poor Standing balance comment: reliant on bilateral UEs on RW and min A with posterior lean                             Pertinent Vitals/Pain Pain Assessment: Faces Faces Pain Scale: Hurts little more Pain Location: R foot Pain Descriptors / Indicators: Sore;Guarding Pain Intervention(s): Monitored during session;Repositioned    Home Living Family/patient expects to be discharged to:: Private residence Living Arrangements: Children;Other relatives Available Help at Discharge: Family;Personal care attendant;Available 24 hours/day;Other (Comment)(has caregiver M-W-F for an hour  before dialysis) Type of Home: House Home Access: Ramped entrance     Home Layout: One level Home Equipment: [place] - 2 wheels;Bedside commode;Shower seat      Prior Function Level of Independence: Needs assistance   Gait / Transfers Assistance Needed: pt ambulates short distances  within her home with RW  ADL's / Homemaking Assistance Needed: pt has aide that assist with bathing/dressing        Hand Dominance        Extremity/Trunk Assessment   Upper Extremity Assessment Upper Extremity Assessment: Generalized weakness    Lower Extremity Assessment Lower Extremity Assessment: Generalized weakness       Communication   Communication: No difficulties  Cognition Arousal/Alertness: Awake/alert Behavior During Therapy: WFL for tasks assessed/performed Overall Cognitive Status: Within Functional Limits for tasks assessed                                 General Comments: cognition not formally assessed but [facility] for general conversation      General Comments      Exercises     Assessment/Plan    PT Assessment Patient needs continued PT services  PT Problem List Decreased strength;Decreased activity tolerance;Decreased balance;Decreased mobility;Decreased coordination;Pain       PT Treatment Interventions DME instruction;Stair training;Functional mobility training;Gait training;Therapeutic activities;Therapeutic exercise;Balance training;Neuromuscular re-education;Patient/family education    PT Goals (Current goals can be found in the Care Plan section)  Acute Rehab PT Goals Patient Stated Goal: return home today PT Goal Formulation: With patient Time For Goal Achievement: 01/27/17 Potential to Achieve Goals: Fair    Frequency Min 3X/week   Barriers to discharge        Co-evaluation               AM-PAC PT "6 Clicks" Daily Activity  Outcome Measure Difficulty turning over in bed (including adjusting bedclothes, sheets and blankets)?: Unable Difficulty moving from lying on back to sitting on the side of the bed? : Unable Difficulty sitting down on and standing up from a chair with arms (e.g., wheelchair, bedside commode, etc,.)?: Unable Help needed moving to and from a bed to chair (including a wheelchair)?: A  Lot Help needed walking in hospital room?: A Lot Help needed climbing 3-5 steps with a railing? : Total 6 Click Score: 8    End of Session Equipment Utilized During Treatment: Gait belt Activity Tolerance: Patient limited by fatigue Patient left: in bed;with call bell/phone within reach;with bed alarm set Nurse Communication: Mobility status PT Visit Diagnosis: Other abnormalities of gait and mobility (R26.89);Muscle weakness (generalized) (M62.81);Difficulty in walking, not elsewhere classified (R26.2)    Time: 9702-6378 PT Time Calculation (min) (ACUTE ONLY): 24 min   Charges:   PT Evaluation $PT Eval Moderate Complexity: 1 Mod PT Treatments $Therapeutic Activity: 8-22 mins   PT G Codes:        [place], PT, DPT [place] 01/13/2017, 9:06 AM

## 2017-01-13 NOTE — Progress Notes (Signed)
Pharmacy Antibiotic Note  [name] is a 72 y.o. female admitted on 01/10/2017 with pyelonephritis infection.  Pharmacy has been consulted for Zosyn dosing. ESRD on dialysis MWF (last session 1/4), afebrile, WBC 10.9.  Plan: Continue Zosyn 3.375gm IV q12h (4 hour infusion) F/u renal plans, C&S, and clinical status  Height: 5' (152.4 cm) Weight: 161 lb 13.1 oz (73.4 kg) IBW/kg (Calculated) : 45.5  Temp (24hrs), Avg:98 F (36.7 C), Min:97.6 F (36.4 C), Max:98.3 F (36.8 C)  Recent Labs  Lab 01/07/17 1555 01/10/17 1625 01/10/17 1638 01/10/17 1845 01/11/17 0948 01/12/17 0609 01/13/17 0632  WBC 14.5* 18.5*  --   --   --  10.5 10.9*  CREATININE 3.82* 4.40* 4.20*  --  5.00* 5.58* 3.70*  LATICACIDVEN  --   --  2.94* 2.63*  --   --   --     Estimated Creatinine Clearance: 12.5 mL/min (A) (by C-G formula based on SCr of 3.7 mg/dL (H)).    Allergies  Allergen Reactions  . Ace Inhibitors Cough  . Eggs Or Egg-Derived Products Nausea And Vomiting  . Enalapril Cough  . Lisinopril Cough  . Omnipaque [Iohexol] Hives    Antimicrobials this admission: Zosyn 1/2 >>   Microbiology results: 1/2 UCx >> insignificant growth 1/3 MRSA PCR >> positive  Thank you for allowing pharmacy to be a part of this patient's care.  Rheba Diamond A Etoile Looman 01/13/2017 10:25 AM

## 2017-01-13 NOTE — Care Management Important Message (Signed)
Important Message  Patient Details  Name: [name] MRN: [id number] Date of Birth: 1945-04-18   Medicare Important Message Given:  Yes    Maryclare Labrador, RN 01/13/2017, 12:12 PM

## 2017-01-13 NOTE — Care Management (Signed)
Medical Necessity for Wheelchair per Attending Dr Loleta Books  Patient suffers from end stage renal disease, chronic diastolic CHF, and history of stroke with residual hemiparesis which impairs their ability to perform daily activities like toileting in the home. A walker will not resolve  issue with performing activities of daily living. A wheelchair will allow patient to safely perform daily activities. Patient can safely propel the wheelchair in the home or has a caregiver who can provide assistance.  Accessories: elevating leg rests (ELRs), wheel locks, extensions and anti-tippers.

## 2017-01-13 NOTE — Progress Notes (Signed)
[name] to be D/C'd Home per MD order.  Discussed prescriptions and follow up appointments with the patient. Prescriptions given to patient, medication list explained in detail. Pt verbalized understanding.  Allergies as of 01/13/2017      Reactions   Ace Inhibitors Cough   Eggs Or Egg-derived Products Nausea And Vomiting   Enalapril Cough   Lisinopril Cough   Omnipaque [iohexol] Hives      Medication List    STOP taking these medications   amoxicillin-clavulanate 875-125 MG tablet Commonly known as:  AUGMENTIN   sulfamethoxazole-trimethoprim 400-80 MG tablet Commonly known as:  BACTRIM     TAKE these medications   acetaminophen 500 MG tablet Commonly known as:  TYLENOL Take 500-1,000 mg by mouth every 6 (six) hours as needed (pain).   albuterol 108 (90 Base) MCG/ACT inhaler Commonly known as:  PROVENTIL HFA;VENTOLIN HFA Inhale 1-2 puffs into the lungs every 6 (six) hours as needed for wheezing or shortness of breath.   ALKA-SELTZER ANTACID PO Take 2 tablets by mouth daily as needed (for indigestion).   aspirin 81 MG tablet Take 1 tablet (81 mg total) by mouth daily.   calcium acetate 667 MG capsule Commonly known as:  PHOSLO Take 2,001-2,668 mg by mouth See admin instructions. 2,001-2,668 mg three times a day with meals   cefpodoxime 200 MG tablet Commonly known as:  VANTIN Take 2 tablets (400 mg total) by mouth 3 (three) times a week. Start taking on:  01/15/2017   dorzolamide-timolol 22.3-6.8 MG/ML ophthalmic solution Commonly known as:  COSOPT Place 1 drop into the left eye daily.   glucose blood test strip Commonly known as:  ONE TOUCH ULTRA TEST 1 each by Other route daily. And lancets 1/day   lidocaine-prilocaine cream Commonly known as:  EMLA Apply 1 application topically as needed (topical anesthesia for hemodialysis if Gebauers and Lidocaine injection are ineffective.).   metoprolol tartrate 50 MG tablet Commonly known as:  LOPRESSOR Take 1  tablet (50 mg total) by mouth every Tuesday, Thursday, Saturday, and Sunday.   nitroGLYCERIN 0.4 MG SL tablet Commonly known as:  NITROSTAT Place 1 tablet (0.4 mg total) under the tongue every 5 (five) minutes as needed for chest pain.   ondansetron 4 MG tablet Commonly known as:  ZOFRAN Take 1 tablet (4 mg total) by mouth every 6 (six) hours as needed for nausea.   pantoprazole 40 MG tablet Commonly known as:  PROTONIX Take 1 tablet (40 mg total) by mouth daily before breakfast.   silver sulfADIAZINE 1 % cream Commonly known as:  SILVADENE Apply 1 application topically See admin instructions. 1 application to right foot once a day   simvastatin 10 MG tablet Commonly known as:  ZOCOR Take 1 tablet (10 mg total) by mouth every evening. What changed:  when to take this            Durable Medical Equipment  (From admission, onward)        Start     Ordered   01/13/17 0917  For home use only DME standard manual wheelchair with seat cushion  (Wheelchairs)  Once    Comments:  Patient suffers from end stage renal disease, chronic diastolic CHF, and history of stroke with residual hemiparesis which impairs their ability to perform daily activities like toileting in the home.  A walker will not resolve  issue with performing activities of daily living. A wheelchair will allow patient to safely perform daily activities. Patient can safely propel  the wheelchair in the home or has a caregiver who can provide assistance.  Accessories: elevating leg rests (ELRs), wheel locks, extensions and anti-tippers.   01/13/17 0917      Vitals:   01/13/17 0614 01/13/17 1023  BP: (!) 103/57 90/75  Pulse: 67 83  Resp: 18   Temp: 98 F (36.7 C)   SpO2: 99%     Skin clean, dry and intact without evidence of skin break down, no evidence of skin tears noted. IV catheter discontinued intact. Site without signs and symptoms of complications. Dressing and pressure applied. Pt denies pain at this  time. No complaints noted.  An After Visit Summary was printed and given to the patient. Patient escorted via [place], and D/C home via private auto.  Chuck Hint RN [facility] [employer]

## 2017-01-13 NOTE — Care Management Note (Addendum)
Case Management Note  Patient Details  Name: PYPER OLEXA MRN: [id number] Date of Birth: [birth year]  Subjective/Objective:     Pt presented post HD with nausea, vomiting and bilateral flank pain               Action/Plan:   PTA from home with daughter and niece - family provides 43 supervision.  Pt continues to refuse SNF and wants to return home with family.  Pt states she is mostly independent with ADLs and walks with the assistance of a cane, pt also has bedside commode.  Pt chose [facility] for both HH recommended and DME - agency accepted referral and informed pt will discharge home today   Expected Discharge Date:  01/14/17               Expected Discharge Plan:  [place]  In-House Referral:  NA  Discharge planning Services  CM Consult  Post Acute Care Choice:  NA Choice offered to:  Patient  DME Arranged:  Wheelchair manual(and cushion) DME Agency:   [place]:  [facility] [place] Agency:  [place]  Status of Service:  In process, will continue to follow  If discussed at Long Length of Stay Meetings, dates discussed:    Additional Comments:  Maryclare Labrador, RN 01/13/2017, 9:25 AM

## 2017-01-13 NOTE — Discharge Summary (Signed)
Physician Discharge Summary  [name] GGY:[id number] DOB: 04-08-1945 DOA: 01/10/2017  PCP: Cassandria Anger, MD  Admit date: 01/10/2017 Discharge date: 01/13/2017  Admitted From: Home  Disposition:  Home   Recommendations for Outpatient Follow-up:  1. Follow up with PCP in 1-2 weeks 2. Please obtain BMP/CBC in one week   Home Health: Yes Equipment/Devices: Wheelchair Patient suffers from end stage renal disease, chronic diastolic CHF, history of stroke, and history of peripheral vascular disease s/p transmetatarsal amputation of the foot which impairs her ability to perform daily activities like grooming, toileting in the home. A walker will not resolve issues with performing activities of daily living. A wheelchair will allow patient to safely perform daily activities. Patient can safely propel the wheelchair in the home or has a caregiver who can provide assistance.  Accessories: elevating leg rests (ELRs), wheel locks, extensions and anti-tippers.  Discharge Condition: Improved, fair CODE STATUS: FULL Diet recommendation: Renal  Brief/Interim Summary: [name] is a 72 yo F with ESRD on HD, CHF EF 25%, DM, colon cancer status post colostomy, and recent PEA arrest during EGD in August who presents from dialysis with nausea, vomiting, and bilateral flank pain.  Urinalysis from urostomy showed full field RBCs/WBCs, new nitrites.  Started on empiric antibiotics.    Pyelonephritis and sepsis The patient exhibited sepsis physiology at presentation and was started on empiric antibiotics for presumed pyelonephritis.   CT abdomen without contrast was nonspecific, included gastroenteritis in the differential.  Treated with 2 days Zosyn, symptoms resolved.  Urine culture had no growth.  Zosyn was stopped.  Other possible diagnostic considerations for her vomiting is reaction to Bactrim/Augmentin, which were replaced at discharge with Vantin. Edrick Oh for foot ulcer -Follow up  with PCP in 1 week  Diabetic foot ulcer This appeared improved, non-infected. -Cefpoxime was dosed at discharge to cover and finish foot infection  ESRD  The patient was dialyzed on schedule.       Discharge Diagnoses:  Principal Problem:   Sepsis secondary to UTI [facility]) Active Problems:   Hypertensive heart disease   Anemia of chronic renal disease   S/P transmetatarsal amputation of foot (HCC)   Diabetes (HCC)   Chronic systolic CHF (congestive heart failure) ([place])   End-stage renal disease on hemodialysis [facility]    Discharge Instructions  Discharge Instructions    AMB Referral to [place] Management   Complete by:  As directed    Hospital referred.  [facility] - Please assign to community care manager for transition of care.  Patient has dialysis MWF, [place]. Location.  Will notify MD office of [facility] following.  Natividad Brood, RN BSN [place] Liaison  [phone] business mobile phone Toll free office [phone]   Reason for consult:  High risk referral   Diagnoses of:   Heart Failure Stroke: Ischemic/TIA Kidney Failure     Expected date of contact:  1-3 days (reserved for hospital discharges)   Diet - low sodium heart healthy   Complete by:  As directed    Discharge instructions   Complete by:  As directed    From Dr. Loleta Books: You were admitted for vomiting and back pain that we believe was from a kidney infection.  It may also have been just a side effect of the Augmentin and Bactrim you were taking.  To complete treatment for both your foot and the kidney infection, take the new antibiotic cefpodoxime (Vantin) Take cefpodoxime/Vantin 400 mg (two tablets) after  dialysis for the next week        (That should equal two tablets Monday afternoon after dialysis, two tablets Weds afternoon and two tablets Fri afternoon)  STOP TAKING AUGMENTIN AND BACTRIM and dispose of these.  Follow up with Dr. Alain Marion by early next week.    Increase activity slowly   Complete by:  As directed      Allergies as of 01/13/2017      Reactions   Ace Inhibitors Cough   Eggs Or Egg-derived Products Nausea And Vomiting   Enalapril Cough   Lisinopril Cough   Omnipaque [iohexol] Hives      Medication List    STOP taking these medications   amoxicillin-clavulanate 875-125 MG tablet Commonly known as:  AUGMENTIN   sulfamethoxazole-trimethoprim 400-80 MG tablet Commonly known as:  BACTRIM     TAKE these medications   acetaminophen 500 MG tablet Commonly known as:  TYLENOL Take 500-1,000 mg by mouth every 6 (six) hours as needed (pain).   albuterol 108 (90 Base) MCG/ACT inhaler Commonly known as:  PROVENTIL HFA;VENTOLIN HFA Inhale 1-2 puffs into the lungs every 6 (six) hours as needed for wheezing or shortness of breath.   ALKA-SELTZER ANTACID PO Take 2 tablets by mouth daily as needed (for indigestion).   aspirin 81 MG tablet Take 1 tablet (81 mg total) by mouth daily.   calcium acetate 667 MG capsule Commonly known as:  PHOSLO Take 2,001-2,668 mg by mouth See admin instructions. 2,001-2,668 mg three times a day with meals   cefpodoxime 200 MG tablet Commonly known as:  VANTIN Take 2 tablets (400 mg total) by mouth 3 (three) times a week. Start taking on:  01/15/2017   dorzolamide-timolol 22.3-6.8 MG/ML ophthalmic solution Commonly known as:  COSOPT Place 1 drop into the left eye daily.   glucose blood test strip Commonly known as:  ONE TOUCH ULTRA TEST 1 each by Other route daily. And lancets 1/day   lidocaine-prilocaine cream Commonly known as:  EMLA Apply 1 application topically as needed (topical anesthesia for hemodialysis if Gebauers and Lidocaine injection are ineffective.).   metoprolol tartrate 50 MG tablet Commonly known as:  LOPRESSOR Take 1 tablet (50 mg total) by mouth every Tuesday, Thursday, Saturday, and Sunday.   nitroGLYCERIN 0.4 MG SL tablet Commonly known as:  NITROSTAT Place 1 tablet  (0.4 mg total) under the tongue every 5 (five) minutes as needed for chest pain.   ondansetron 4 MG tablet Commonly known as:  ZOFRAN Take 1 tablet (4 mg total) by mouth every 6 (six) hours as needed for nausea.   pantoprazole 40 MG tablet Commonly known as:  PROTONIX Take 1 tablet (40 mg total) by mouth daily before breakfast.   silver sulfADIAZINE 1 % cream Commonly known as:  SILVADENE Apply 1 application topically See admin instructions. 1 application to right foot once a day   simvastatin 10 MG tablet Commonly known as:  ZOCOR Take 1 tablet (10 mg total) by mouth every evening. What changed:  when to take this            Durable Medical Equipment  (From admission, onward)        Start     Ordered   01/13/17 0917  For home use only DME standard manual wheelchair with seat cushion  (Wheelchairs)  Once    Comments:  Patient suffers from end stage renal disease, chronic diastolic CHF, and history of stroke with residual hemiparesis which impairs their ability to perform  daily activities like toileting in the home.  A walker will not resolve  issue with performing activities of daily living. A wheelchair will allow patient to safely perform daily activities. Patient can safely propel the wheelchair in the home or has a caregiver who can provide assistance.  Accessories: elevating leg rests (ELRs), wheel locks, extensions and anti-tippers.   01/13/17 0917     Follow-up Information    [facility] Follow up.   Specialty:  Home Health Services Why:  Physical Therapist Contact information: [address] [place] [address] [place] Follow up.   Why:  wheelchair Contact information: [place] [address] [phone]          Allergies  Allergen Reactions  . Ace Inhibitors Cough  . Eggs Or Egg-Derived Products Nausea And Vomiting  . Enalapril Cough  . Lisinopril Cough   . Omnipaque [Iohexol] Hives    Consultations:  None   Procedures/Studies: Ct Abdomen Pelvis Wo Contrast  Result Date: 01/10/2017 CLINICAL DATA:  Back pain with nausea and vomiting. EXAM: CT ABDOMEN AND PELVIS WITHOUT CONTRAST TECHNIQUE: Multidetector CT imaging of the abdomen and pelvis was performed following the standard protocol without IV contrast. COMPARISON:  05/21/2011 FINDINGS: Lower chest:  Cardiomegaly.  Coronary artery calcification evident. Hepatobiliary: Liver measures 19.4 cm craniocaudal length, enlarged. Calcified stones are seen in the gallbladder. No intrahepatic or extrahepatic biliary dilation. Pancreas: Pancreas is atrophic. Spleen: Similar appearance of the irregular 4.7 cm splenic lesion. Adrenals/Urinary Tract: No adrenal nodule or mass. Both kidneys are atrophic. Bladder is decompressed. Stomach/Bowel: Stomach is nondistended. No gastric wall thickening. No evidence of outlet obstruction. Duodenum is normally positioned as is the ligament of Treitz. No gross dilatation of small bowel. Bowel anastomosis identified anterior midline abdomen. There are bilateral ostomies, not well evaluated given the extensive body wall edema and areas of fluid around the stoma on each side. On the previous study from 2013 , the patient appear to have a right lower quadrant loop ileostomy and a left lower quadrant end colostomy. There is a small volume of fluid associated with the right stoma on today's exam and a more prominent amount of fluid is seen and the left parastomal hernia. A collection of apparent stool and gas in the extreme low pelvis is similar to prior and presumably represents the patient's cecum, better demonstrated on the previous CT. Vascular/Lymphatic: Extensive calcification is noted and arterial anatomy. Upper normal retroperitoneal lymph nodes are evident. Reproductive: Uterus is apparently surgically absent. No definite adnexal mass. Other: Extensive body wall edema is noted.  There is a small amount of free fluid around the liver. Diffuse edema is noted in the mesentery and there is free fluid in the lower pelvis. Musculoskeletal: Extensive pelvic floor laxity is evident. IMPRESSION: 1. No evidence for overt bowel obstruction and no intraperitoneal free air is identified on today's study. Assessment is limited by the diffuse body wall and mesenteric edema, free fluid, lack of intravenous contrast and extensive postsurgical changes. Probable loop ileostomy right lower quadrant with sigmoid end colostomy left lower quadrant. There is some fluid in the left lower quadrant parastomal hernia, nonspecific. Free fluid is also noted in the anatomic pelvis. 2. Extensive vascular calcification. 3. Cholelithiasis. 4. Marked pelvic floor laxity. 5. No urinary bladder identified. Bladder may be markedly decompressed or surgically absent. Electronically Signed   By: Misty Stanley M.D.   On:  01/10/2017 17:31   Dg Chest Port 1 View  Result Date: 01/07/2017 CLINICAL DATA:  Hematemesis. EXAM: PORTABLE CHEST 1 VIEW COMPARISON:  11/03/2016 FINDINGS: 1503 hours. The cardio pericardial silhouette is enlarged. The lungs are clear without focal pneumonia, edema, pneumothorax or pleural effusion. Pulmonary nodule superimposed on the posterior left seventh rib was not visualized on the prior exam. The visualized bony structures of the thorax are intact. IMPRESSION: Cardiomegaly without edema or focal airspace consolidation. Small nodule in the left mid lung not seen on the prior study. CT chest without contrast recommended to further evaluate. Electronically Signed   By: Misty Stanley M.D.   On: 01/07/2017 15:28   Dg Foot 2 Views Right  Result Date: 01/07/2017 CLINICAL DATA:  Right foot ulcer at the second metatarsal. EXAM: RIGHT FOOT - 2 VIEW COMPARISON:  October 04, 2010 FINDINGS: Patient is status post partial amputation of the mid to distal right foot. The bony stumps demonstrate no evidence of  osteopenia, bone destruction to suggest osteomyelitis. IMPRESSION: Status post prior amputation of the mid to distal right foot. No evidence of osteomyelitis. Electronically Signed   By: Abelardo Diesel M.D.   On: 01/07/2017 19:42       Subjective: Feels fine.  Good appetite.  Foot hurts a little.  No more pain in back, no more vomiting, no fever, confusion, weakness.  Discharge Exam: Vitals:   01/13/17 0614 01/13/17 1023  BP: (!) 103/57 90/75  Pulse: 67 83  Resp: 18   Temp: 98 F (36.7 C)   SpO2: 99%    Vitals:   01/12/17 2206 01/13/17 0112 01/13/17 0614 01/13/17 1023  BP: (!) 86/37 (!) 117/47 (!) 103/57 90/75  Pulse: 75  67 83  Resp: 19  18   Temp: 98.3 F (36.8 C)  98 F (36.7 C)   TempSrc: Oral  Oral   SpO2: 99%  99%   Weight: 73.4 kg (161 lb 13.1 oz)     Height:        General: Pt is alert, awake, not in acute distress Cardiovascular: RRR, S1/S2 +, mild sys murmur, no rubs, no gallops Respiratory: CTA bilaterally, no wheezing, no rhonchi Abdominal: Soft, NT, ND, bowel sounds + Extremities: no edema, no cyanosis, ulcer small, no surrounding redness    The results of significant diagnostics from this hospitalization (including imaging, microbiology, ancillary and laboratory) are listed below for reference.     Microbiology: Recent Results (from the past 240 hour(s))  Urine culture     Status: Abnormal   Collection Time: 01/10/17  7:25 PM  Result Value Ref Range Status   Specimen Description URINE, RANDOM  Final   Special Requests NONE  Final   Culture <10,000 COLONIES/mL INSIGNIFICANT GROWTH (A)  Final   Report Status 01/12/2017 FINAL  Final  MRSA PCR Screening     Status: Abnormal   Collection Time: 01/11/17 12:01 AM  Result Value Ref Range Status   MRSA by PCR POSITIVE (A) NEGATIVE Final    Comment:        The GeneXpert MRSA Assay (FDA approved for NASAL specimens only), is one component of a comprehensive MRSA colonization surveillance program. It is  not intended to diagnose MRSA infection nor to guide or monitor treatment for MRSA infections. RESULT CALLED TO, READ BACK BY AND VERIFIED WITHVicenta Aly RN [id number] 01/11/17 A BROWNING      Labs: BNP (last 3 results) Recent Labs    07/23/16 1640  BNP >4,500.0*   Basic  Metabolic Panel: Recent Labs  Lab 01/07/17 1555 01/10/17 1625 01/10/17 1638 01/11/17 0948 01/12/17 0609 01/13/17 0632  NA 137 137 140 137 138 134*  K 3.1* 3.4* 3.1* 3.3* 3.3* 3.9  CL 97* 98* 97* 99* 98* 94*  CO2 26 26  --  21* 28 24  GLUCOSE 155* 142* 151* 189* 129* 179*  BUN 24* 28* 29* 33* 36* 21*  CREATININE 3.82* 4.40* 4.20* 5.00* 5.58* 3.70*  CALCIUM 8.4* 8.1*  --  8.1* 8.4* 8.1*   Liver Function Tests: Recent Labs  Lab 01/07/17 1555 01/10/17 1625  AST 17 17  ALT 10* <5*  ALKPHOS 140* 185*  BILITOT 1.4* 0.5  PROT 6.5 6.3*  ALBUMIN 2.6* 2.4*   Recent Labs  Lab 01/10/17 1625  LIPASE 23   No results for input(s): AMMONIA in the last 168 hours. CBC: Recent Labs  Lab 01/07/17 1555 01/10/17 1625 01/10/17 1638 01/12/17 0609 01/13/17 0632  WBC 14.5* 18.5*  --  10.5 10.9*  NEUTROABS  --  17.0*  --   --   --   HGB 12.2 11.1* 13.3 10.7* 11.7*  HCT 38.0 35.1* 39.0 34.4* 37.6  MCV 86.8 83.4  --  84.9 85.6  PLT 207 237  --  277 290   Cardiac Enzymes: No results for input(s): CKTOTAL, CKMB, CKMBINDEX, TROPONINI in the last 168 hours. BNP: Invalid input(s): POCBNP CBG: Recent Labs  Lab 01/12/17 0725 01/12/17 1352 01/12/17 1633 01/12/17 2205 01/13/17 0744  GLUCAP 122* 69 134* 188* 184*   D-Dimer No results for input(s): DDIMER in the last 72 hours. Hgb A1c No results for input(s): HGBA1C in the last 72 hours. Lipid Profile No results for input(s): CHOL, HDL, LDLCALC, TRIG, CHOLHDL, LDLDIRECT in the last 72 hours. Thyroid function studies No results for input(s): TSH, T4TOTAL, T3FREE, THYROIDAB in the last 72 hours.  Invalid input(s): FREET3 Anemia work up No results for  input(s): VITAMINB12, FOLATE, FERRITIN, TIBC, IRON, RETICCTPCT in the last 72 hours. Urinalysis    Component Value Date/Time   COLORURINE AMBER (A) 01/10/2017 2024   APPEARANCEUR TURBID (A) 01/10/2017 2024   LABSPEC 1.011 01/10/2017 2024   PHURINE 8.0 01/10/2017 2024   GLUCOSEU 150 (A) 01/10/2017 2024   HGBUR MODERATE (A) 01/10/2017 2024   BILIRUBINUR NEGATIVE 01/10/2017 2024   KETONESUR NEGATIVE 01/10/2017 2024   PROTEINUR 100 (A) 01/10/2017 2024   UROBILINOGEN 4.0 (H) 05/20/2011 2219   NITRITE POSITIVE (A) 01/10/2017 2024   LEUKOCYTESUR LARGE (A) 01/10/2017 2024   Sepsis Labs Invalid input(s): PROCALCITONIN,  WBC,  LACTICIDVEN Microbiology Recent Results (from the past 240 hour(s))  Urine culture     Status: Abnormal   Collection Time: 01/10/17  7:25 PM  Result Value Ref Range Status   Specimen Description URINE, RANDOM  Final   Special Requests NONE  Final   Culture <10,000 COLONIES/mL INSIGNIFICANT GROWTH (A)  Final   Report Status 01/12/2017 FINAL  Final  MRSA PCR Screening     Status: Abnormal   Collection Time: 01/11/17 12:01 AM  Result Value Ref Range Status   MRSA by PCR POSITIVE (A) NEGATIVE Final    Comment:        The GeneXpert MRSA Assay (FDA approved for NASAL specimens only), is one component of a comprehensive MRSA colonization surveillance program. It is not intended to diagnose MRSA infection nor to guide or monitor treatment for MRSA infections. RESULT CALLED TO, READ BACK BY AND VERIFIED WITHVicenta Aly RN [id number] 01/11/17 A BROWNING  Time coordinating discharge: More than 30 minutes  SIGNED:   Edwin Dada, MD  [facility] 01/13/2017, 11:10 AM

## 2017-01-14 ENCOUNTER — Other Ambulatory Visit: Payer: Self-pay | Admitting: Family Medicine

## 2017-01-14 ENCOUNTER — Other Ambulatory Visit: Payer: Self-pay

## 2017-01-14 ENCOUNTER — Ambulatory Visit (HOSPITAL_COMMUNITY)
Admission: EM | Admit: 2017-01-14 | Discharge: 2017-01-14 | Disposition: A | Discharge status: Home / Self Care | Payer: Medicare Other | Attending: Physician Assistant | Admitting: Physician Assistant

## 2017-01-14 ENCOUNTER — Encounter (HOSPITAL_COMMUNITY): Payer: Self-pay | Admitting: *Deleted

## 2017-01-14 DIAGNOSIS — L89899 Pressure ulcer of other site, unspecified stage: Secondary | ICD-10-CM

## 2017-01-14 DIAGNOSIS — M25571 Pain in right ankle and joints of right foot: Secondary | ICD-10-CM | POA: Diagnosis not present

## 2017-01-14 MED ORDER — HYDROCODONE-ACETAMINOPHEN 5-325 MG PO TABS: 1.0000 | ORAL_TABLET | Freq: Four times a day (QID) | ORAL | 0 refills | Status: DC | PRN

## 2017-01-14 MED ORDER — OXYCODONE HCL 5 MG PO TABS: 5.0000 mg | ORAL_TABLET | Freq: Three times a day (TID) | ORAL | 0 refills | Status: DC | PRN

## 2017-01-14 NOTE — Discharge Instructions (Signed)
Please start Vantin as instructed in you hospital discharge summary.  Please don't miss dialysis.  Please follow up as scheduled with vascular and you primary care provider. Go to the hospital if any sudden dizziness or shortness of breath.

## 2017-01-14 NOTE — ED Triage Notes (Signed)
D/c from hospital yesterday, per pt daughter there's an ulcer on her right foot, per pt daughter pt is in a lot of pain and is here for pain meds until she gets to f/u with her PCP

## 2017-01-14 NOTE — ED Provider Notes (Addendum)
01/14/2017 2:07 PM   DOB: 02-Feb-1945 / MRN: [id number]  SUBJECTIVE:  [name] is a 72 y.o. vasculopathic female with a history of ESRD presenting for follow up of foot pain 2/2 to a pressure ulce on the bottom of her right foot. This is not new for her.  Was recently discharged from the hospital for supposed pyelonephritis. She was discharged in good condition and advised to start Vantin tomorrow and plans to go pick this up today at the pharmacy. Family is here and tells me that she dialysis tomorrow along with close follow up with vascular.  The wound has been present for months now. She would like something for the pain.   She is allergic to ace inhibitors; eggs or egg-derived products; enalapril; lisinopril; and omnipaque [iohexol].   She  has a past medical history of Acute respiratory failure with hypoxia ([place]) (10/23/2014), Adenomatous duodenal polyp, Anemia, Arthritis, Atrial tachycardia ([place]), Cardiomyopathy- mixed, CHF (congestive heart failure) ([place]), Chronic diastolic heart failure ([place]), Colon cancer ([place]) (1986), Colostomy in place [facility]), Coronary artery disease, Dialysis patient ([place]), ESRD (end stage renal disease) on dialysis ([place]), GERD (gastroesophageal reflux disease), Glaucoma, Heart murmur, Hernia, incisional, Hyperlipidemia, Hypertension, Hypertensive heart disease, LBBB (left bundle branch block), Mitral valve insufficiency and aortic valve insufficiency, Obesity, Stroke ([place]), and Type II diabetes mellitus ([place]).    She  reports that  has never smoked. she has never used smokeless tobacco. She reports that she does not drink alcohol or use drugs. She  reports that she does not engage in sexual activity. 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); Cardiac  catheterization; Pars plana vitrectomy (11/29/2011); Pars plana vitrectomy (Left, 02/28/2012); Esophagogastroduodenoscopy (N/A, 02/02/2013); Colonoscopy; Insertion of ahmed valve (Right, 08/20/2013); Insertion of ahmed valve (Left, 07/22/2014); Mitomycin c application (Left, 5/32/9924); Cardiac catheterization (N/A, 10/27/2014); Cardiac catheterization (N/A, 10/28/2014); Cardiac catheterization (Bilateral, 12/2008); Supraventricular tachycardia ablation (11/17/2015); Colon surgery; Eye surgery; Cataract extraction w/ intraocular lens  implant, bilateral (Bilateral); Vaginal hysterectomy; Cardiac catheterization (N/A, 11/17/2015); Esophagogastroduodenoscopy (egd) with propofol (N/A, 08/17/2016); Balloon dilation (N/A, 08/17/2016); and LEFT HEART CATH AND CORONARY ANGIOGRAPHY (N/A, 08/18/2016).  Her family history includes Cancer in her sister; Coronary artery disease in her mother; Diabetes in her father; Hypertension in her father, mother, and other.  Review of Systems  Constitutional: Negative for chills and fever.  Respiratory: Negative for cough and shortness of breath.   Cardiovascular: Negative for chest pain, palpitations and leg swelling.  Genitourinary: Negative.   Skin: Negative for itching and rash.  Neurological: Negative for dizziness.    OBJECTIVE:  BP (!) 102/38 (BP Location: Right Arm)   Pulse 79   Temp 97.8 F (36.6 C) (Oral)   SpO2 98%   BP Readings from Last 3 Encounters:  01/14/17 (!) 102/38  01/13/17 90/75  01/07/17 114/78     Physical Exam  Constitutional: She is active.  Non-toxic appearance.  Cardiovascular: Normal rate, regular rhythm, S1 normal, S2 normal and normal heart sounds. Exam reveals no gallop and no friction rub.  No murmur heard. Pulmonary/Chest: Effort normal. No stridor. No tachypnea. No respiratory distress. She has no wheezes. She has no rales.  Abdominal: She exhibits no distension.  Musculoskeletal: She exhibits no edema.       Feet:  Neurological:  She is alert.  Skin: Skin is warm and dry. She is not diaphoretic. No pallor.  Results for orders placed or performed during the hospital encounter of 01/10/17 (from the past 72 hour(s))  Glucose, capillary     Status: Abnormal   Collection Time: 01/11/17  4:51 PM  Result Value Ref Range   Glucose-Capillary 156 (H) 65 - 99 mg/dL  Glucose, capillary     Status: Abnormal   Collection Time: 01/11/17  9:03 PM  Result Value Ref Range   Glucose-Capillary 172 (H) 65 - 99 mg/dL  Basic metabolic panel     Status: Abnormal   Collection Time: 01/12/17  6:09 AM  Result Value Ref Range   Sodium 138 135 - 145 mmol/L   Potassium 3.3 (L) 3.5 - 5.1 mmol/L   Chloride 98 (L) 101 - 111 mmol/L   CO2 28 22 - 32 mmol/L   Glucose, Bld 129 (H) 65 - 99 mg/dL   BUN 36 (H) 6 - 20 mg/dL   Creatinine, Ser 5.58 (H) 0.44 - 1.00 mg/dL   Calcium 8.4 (L) 8.9 - 10.3 mg/dL   GFR calc non Af Amer 7 (L) >60 mL/min   GFR calc Af Amer 8 (L) >60 mL/min    Comment: (NOTE) The eGFR has been calculated using the CKD EPI equation. This calculation has not been validated in all clinical situations. eGFR's persistently <60 mL/min signify possible Chronic Kidney Disease.    Anion gap 12 5 - 15  CBC     Status: Abnormal   Collection Time: 01/12/17  6:09 AM  Result Value Ref Range   WBC 10.5 4.0 - 10.5 K/uL   RBC 4.05 3.87 - 5.11 MIL/uL   Hemoglobin 10.7 (L) 12.0 - 15.0 g/dL   HCT 34.4 (L) 36.0 - 46.0 %   MCV 84.9 78.0 - 100.0 fL   MCH 26.4 26.0 - 34.0 pg   MCHC 31.1 30.0 - 36.0 g/dL   RDW 16.5 (H) 11.5 - 15.5 %   Platelets 277 150 - 400 K/uL  Glucose, capillary     Status: Abnormal   Collection Time: 01/12/17  7:25 AM  Result Value Ref Range   Glucose-Capillary 122 (H) 65 - 99 mg/dL  Glucose, capillary     Status: None   Collection Time: 01/12/17  1:52 PM  Result Value Ref Range   Glucose-Capillary 69 65 - 99 mg/dL  Glucose, capillary     Status: Abnormal   Collection Time: 01/12/17  4:33 PM  Result Value Ref  Range   Glucose-Capillary 134 (H) 65 - 99 mg/dL  Glucose, capillary     Status: Abnormal   Collection Time: 01/12/17 10:05 PM  Result Value Ref Range   Glucose-Capillary 188 (H) 65 - 99 mg/dL  Basic metabolic panel     Status: Abnormal   Collection Time: 01/13/17  6:32 AM  Result Value Ref Range   Sodium 134 (L) 135 - 145 mmol/L   Potassium 3.9 3.5 - 5.1 mmol/L   Chloride 94 (L) 101 - 111 mmol/L   CO2 24 22 - 32 mmol/L   Glucose, Bld 179 (H) 65 - 99 mg/dL   BUN 21 (H) 6 - 20 mg/dL   Creatinine, Ser 3.70 (H) 0.44 - 1.00 mg/dL    Comment: DELTA CHECK NOTED   Calcium 8.1 (L) 8.9 - 10.3 mg/dL   GFR calc non Af Amer 11 (L) >60 mL/min   GFR calc Af Amer 13 (L) >60 mL/min    Comment: (NOTE) The eGFR has been calculated using the CKD EPI equation. This calculation has not been  validated in all clinical situations. eGFR's persistently <60 mL/min signify possible Chronic Kidney Disease.    Anion gap 16 (H) 5 - 15  CBC     Status: Abnormal   Collection Time: 01/13/17  6:32 AM  Result Value Ref Range   WBC 10.9 (H) 4.0 - 10.5 K/uL   RBC 4.39 3.87 - 5.11 MIL/uL   Hemoglobin 11.7 (L) 12.0 - 15.0 g/dL   HCT 37.6 36.0 - 46.0 %   MCV 85.6 78.0 - 100.0 fL   MCH 26.7 26.0 - 34.0 pg   MCHC 31.1 30.0 - 36.0 g/dL   RDW 16.6 (H) 11.5 - 15.5 %   Platelets 290 150 - 400 K/uL  Glucose, capillary     Status: Abnormal   Collection Time: 01/13/17  7:44 AM  Result Value Ref Range   Glucose-Capillary 184 (H) 65 - 99 mg/dL  Glucose, capillary     Status: Abnormal   Collection Time: 01/13/17 11:34 AM  Result Value Ref Range   Glucose-Capillary 217 (H) 65 - 99 mg/dL    No results found.  ASSESSMENT AND PLAN:  Pain in joint involving right ankle and foot: She has dialysis in place for tomorrow.  She has a vascular follow up and PCP follow up pending.  She is to start [facility] tomorrow per [facility] discharge.  ED precautions discussed and given per AVS.   Pressure injury of skin of right foot,  unspecified injury stage      The patient is advised to call or return to clinic if she does not see an improvement in symptoms, or to seek the care of the closest emergency department if she worsens with the above plan.   Philis Fendt, MHS, PA-C 01/14/2017 2:07 PM        Tereasa Coop, PA-C 01/14/17 1411

## 2017-01-14 NOTE — Progress Notes (Signed)
Received call from Daughter.  Patient in a lot of pain from foot.  I prescribed oxycodone in hospital that helped, acetaminophen not helpnig at home.  Foot ulcer appears to have more drainage per daughter.  They are taking Vantin.  I will provide 1 day oxycodone, to which she had good response in hospital.  Daughter will call tomorrow AM to schedule PCP appointment to eval foot, will present to urgent care sooner if concerns.  Will also call to reschedule VVS appointment.    [place] reviewed, no previous opiate presriptions.

## 2017-01-15 ENCOUNTER — Telehealth: Payer: Self-pay | Admitting: *Deleted

## 2017-01-15 DIAGNOSIS — N2581 Secondary hyperparathyroidism of renal origin: Secondary | ICD-10-CM | POA: Diagnosis not present

## 2017-01-15 DIAGNOSIS — D631 Anemia in chronic kidney disease: Secondary | ICD-10-CM | POA: Diagnosis not present

## 2017-01-15 DIAGNOSIS — D509 Iron deficiency anemia, unspecified: Secondary | ICD-10-CM | POA: Diagnosis not present

## 2017-01-15 DIAGNOSIS — N186 End stage renal disease: Secondary | ICD-10-CM | POA: Diagnosis not present

## 2017-01-15 NOTE — Telephone Encounter (Signed)
Transition Care Management Follow-up Telephone Call   Date discharged? 01/13/17   How have you been since you were released from the hospital? Called pt spoke w/daughter [name]) she states mom has left for dialysis   Do you understand why you were in the hospital? YES   Do you understand the discharge instructions? YES   Where were you discharged to? Home   Items Reviewed:  Medications reviewed: YES  Allergies reviewed: YES  Dietary changes reviewed: YES, diabetic and carb modified   Referrals reviewed: NO   Functional Questionnaire:   Activities of Daily Living (ADLs):   She states mom are independent in the following: feeding, continence, grooming and toileting States she require assistance with the following: ambulation, bathing and hygiene and dressing   Any transportation issues/concerns?: NO   Any patient concerns? YES, She states mom (R) foot is giving her problems she also have an appt tomorrow w/cardiovascular doctor at 10:45 to make sure there is circulation in that foot.   Confirmed importance and date/time of follow-up visits scheduled YES, mom had called this am to make appt for 01/16/17. Inform daughter will keep the same time, but change appt status to hosp f/u instead.   Provider Appointment booked with Dr. Alain Marion  Confirmed with patient if condition begins to worsen call PCP or go to the ER.  Patient was given the office number and encouraged to call back with question or concerns.  : YES

## 2017-01-16 ENCOUNTER — Ambulatory Visit (HOSPITAL_COMMUNITY)
Admission: RE | Admit: 2017-01-16 | Discharge: 2017-01-16 | Disposition: A | Discharge status: Home / Self Care | Payer: Medicare Other | Source: Ambulatory Visit | Attending: Vascular Surgery | Admitting: Vascular Surgery

## 2017-01-16 ENCOUNTER — Ambulatory Visit (INDEPENDENT_AMBULATORY_CARE_PROVIDER_SITE_OTHER): Payer: Medicare Other | Admitting: Internal Medicine

## 2017-01-16 ENCOUNTER — Encounter: Payer: Self-pay | Admitting: Internal Medicine

## 2017-01-16 DIAGNOSIS — N186 End stage renal disease: Secondary | ICD-10-CM | POA: Diagnosis not present

## 2017-01-16 DIAGNOSIS — I739 Peripheral vascular disease, unspecified: Secondary | ICD-10-CM | POA: Diagnosis not present

## 2017-01-16 DIAGNOSIS — I5022 Chronic systolic (congestive) heart failure: Secondary | ICD-10-CM | POA: Diagnosis not present

## 2017-01-16 DIAGNOSIS — I132 Hypertensive heart and chronic kidney disease with heart failure and with stage 5 chronic kidney disease, or end stage renal disease: Secondary | ICD-10-CM | POA: Diagnosis not present

## 2017-01-16 DIAGNOSIS — E1122 Type 2 diabetes mellitus with diabetic chronic kidney disease: Secondary | ICD-10-CM | POA: Diagnosis not present

## 2017-01-16 DIAGNOSIS — N12 Tubulo-interstitial nephritis, not specified as acute or chronic: Secondary | ICD-10-CM

## 2017-01-16 DIAGNOSIS — Z8673 Personal history of transient ischemic attack (TIA), and cerebral infarction without residual deficits: Secondary | ICD-10-CM | POA: Diagnosis not present

## 2017-01-16 DIAGNOSIS — N390 Urinary tract infection, site not specified: Secondary | ICD-10-CM | POA: Diagnosis not present

## 2017-01-16 DIAGNOSIS — M79671 Pain in right foot: Secondary | ICD-10-CM

## 2017-01-16 DIAGNOSIS — Z992 Dependence on renal dialysis: Secondary | ICD-10-CM | POA: Diagnosis not present

## 2017-01-16 DIAGNOSIS — E1151 Type 2 diabetes mellitus with diabetic peripheral angiopathy without gangrene: Secondary | ICD-10-CM | POA: Diagnosis not present

## 2017-01-16 DIAGNOSIS — E11622 Type 2 diabetes mellitus with other skin ulcer: Secondary | ICD-10-CM | POA: Insufficient documentation

## 2017-01-16 DIAGNOSIS — E669 Obesity, unspecified: Secondary | ICD-10-CM | POA: Diagnosis not present

## 2017-01-16 DIAGNOSIS — I69351 Hemiplegia and hemiparesis following cerebral infarction affecting right dominant side: Secondary | ICD-10-CM

## 2017-01-16 DIAGNOSIS — Z85038 Personal history of other malignant neoplasm of large intestine: Secondary | ICD-10-CM | POA: Diagnosis not present

## 2017-01-16 DIAGNOSIS — L98499 Non-pressure chronic ulcer of skin of other sites with unspecified severity: Secondary | ICD-10-CM | POA: Diagnosis not present

## 2017-01-16 DIAGNOSIS — Z936 Other artificial openings of urinary tract status: Secondary | ICD-10-CM | POA: Diagnosis not present

## 2017-01-16 DIAGNOSIS — D631 Anemia in chronic kidney disease: Secondary | ICD-10-CM | POA: Diagnosis not present

## 2017-01-16 DIAGNOSIS — Z7982 Long term (current) use of aspirin: Secondary | ICD-10-CM | POA: Diagnosis not present

## 2017-01-16 DIAGNOSIS — Z7951 Long term (current) use of inhaled steroids: Secondary | ICD-10-CM | POA: Diagnosis not present

## 2017-01-16 DIAGNOSIS — K219 Gastro-esophageal reflux disease without esophagitis: Secondary | ICD-10-CM | POA: Diagnosis not present

## 2017-01-16 DIAGNOSIS — I2510 Atherosclerotic heart disease of native coronary artery without angina pectoris: Secondary | ICD-10-CM | POA: Diagnosis not present

## 2017-01-16 DIAGNOSIS — Z933 Colostomy status: Secondary | ICD-10-CM | POA: Diagnosis not present

## 2017-01-16 DIAGNOSIS — E785 Hyperlipidemia, unspecified: Secondary | ICD-10-CM | POA: Diagnosis not present

## 2017-01-16 DIAGNOSIS — I6930 Unspecified sequelae of cerebral infarction: Secondary | ICD-10-CM

## 2017-01-16 MED ORDER — OXYCODONE HCL 5 MG PO TABS: 5.0000 mg | ORAL_TABLET | Freq: Four times a day (QID) | ORAL | 0 refills | Status: AC | PRN

## 2017-01-16 NOTE — Assessment & Plan Note (Addendum)
R hemiparesis  Lift chair

## 2017-01-16 NOTE — Assessment & Plan Note (Signed)
Probable PAD related pain - art Doppler today is pending Norco prn - not helping - Oxycodone w/caution  Potential benefits of a long term opioids use as well as potential risks (i.e. addiction risk, apnea etc) and complications (i.e. Somnolence, constipation and others) were explained to the patient and were aknowledged.

## 2017-01-16 NOTE — Assessment & Plan Note (Signed)
On HD 

## 2017-01-16 NOTE — Progress Notes (Addendum)
Subjective:  Patient ID: [name], female    DOB: 05-26-1945  Age: 72 y.o. MRN: [id number]  CC: No chief complaint on file.   HPI [name] presents for a post-hosp visit s/p inpatient stay for pyelonephritis (d/c on 01/13/17) - treated w/IV abx and d/c'd on Vantin (records reviewed). R foot pain and ulcer x over 1 month: severe. RLE w/o DVT per Duplex test on 01/07/17 F/u ESRD, HTN, DM f/u Pain is 10/10  Outpatient Medications Prior to Visit  Medication Sig Dispense Refill  . acetaminophen (TYLENOL) 500 MG tablet Take 500-1,000 mg by mouth every 6 (six) hours as needed (pain).     [name] albuterol (PROVENTIL HFA;VENTOLIN HFA) 108 (90 Base) MCG/ACT inhaler Inhale 1-2 puffs into the lungs every 6 (six) hours as needed for wheezing or shortness of breath.    [name] aspirin 81 MG tablet Take 1 tablet (81 mg total) by mouth daily. 30 tablet 3  . calcium acetate (PHOSLO) 667 MG capsule Take 2,001-2,668 mg by mouth See admin instructions. 2,001-2,668 mg three times a day with meals    . Calcium Carbonate Antacid (ALKA-SELTZER ANTACID PO) Take 2 tablets by mouth daily as needed (for indigestion).     . cefpodoxime (VANTIN) 200 MG tablet Take 2 tablets (400 mg total) by mouth 3 (three) times a week. 6 tablet 0  . dorzolamide-timolol (COSOPT) 22.3-6.8 MG/ML ophthalmic solution Place 1 drop into the left eye daily.   12  . glucose blood (ONE TOUCH ULTRA TEST) test strip 1 each by Other route daily. And lancets 1/day 100 each 3  . HYDROcodone-acetaminophen (NORCO) 5-325 MG tablet Take 1 tablet by mouth every 6 (six) hours as needed for severe pain (May cause constipation). For severe pain only. Do not mix with alcohol, benzodiazepines, muscle relaxer. No refills without office visit. 12 tablet 0  . lidocaine-prilocaine (EMLA) cream Apply 1 application topically as needed (topical anesthesia for hemodialysis if Gebauers and Lidocaine injection are ineffective.). 30 g 0  . metoprolol tartrate  (LOPRESSOR) 50 MG tablet Take 1 tablet (50 mg total) by mouth every Tuesday, Thursday, Saturday, and Sunday. 30 tablet 0  . nitroGLYCERIN (NITROSTAT) 0.4 MG SL tablet Place 1 tablet (0.4 mg total) under the tongue every 5 (five) minutes as needed for chest pain. 30 tablet 3  . ondansetron (ZOFRAN) 4 MG tablet Take 1 tablet (4 mg total) by mouth every 6 (six) hours as needed for nausea. 20 tablet 0  . oxyCODONE (ROXICODONE) 5 MG immediate release tablet Take 1 tablet (5 mg total) by mouth every 8 (eight) hours as needed for up to 5 days. 4 tablet 0  . pantoprazole (PROTONIX) 40 MG tablet Take 1 tablet (40 mg total) by mouth daily before breakfast. 30 tablet 11  . silver sulfADIAZINE (SILVADENE) 1 % cream Apply 1 application topically See admin instructions. 1 application to right foot once a day  2  . simvastatin (ZOCOR) 10 MG tablet Take 1 tablet (10 mg total) by mouth every evening. (Patient taking differently: Take 10 mg by mouth daily. ) 90 tablet 3   Facility-Administered Medications Prior to Visit  Medication Dose Route Frequency Provider Last Rate Last Dose  . mitoMYcin (MUTAMYCIN) Injection Use in OR only (0.4 mg/ml)  0.5 mL Left Eye Once Marylynn Pearson, MD        ROS Review of Systems  Constitutional: Positive for fatigue. Negative for activity change, appetite change, chills and unexpected weight change.  HENT: Negative for  congestion, mouth sores and sinus pressure.   Eyes: Negative for visual disturbance.  Respiratory: Positive for shortness of breath. Negative for cough and chest tightness.   Gastrointestinal: Negative for abdominal pain and nausea.  Genitourinary: Negative for difficulty urinating, frequency and vaginal pain.  Musculoskeletal: Positive for arthralgias and gait problem. Negative for back pain.  Skin: Positive for color change and wound. Negative for pallor and rash.  Neurological: Positive for weakness. Negative for dizziness, tremors, numbness and headaches.    Psychiatric/Behavioral: Positive for decreased concentration. Negative for confusion, sleep disturbance and suicidal ideas. The patient is nervous/anxious.     Objective:  BP (!) 112/54 (BP Location: Right Arm, Patient Position: Sitting, Cuff Size: Large)   Pulse (!) 113   Temp 97.6 F (36.4 C) (Oral)   SpO2 94%   BP Readings from Last 3 Encounters:  01/16/17 (!) 112/54  01/14/17 (!) 102/38  01/13/17 90/75    Wt Readings from Last 3 Encounters:  01/12/17 161 lb 13.1 oz (73.4 kg)  01/07/17 165 lb (74.8 kg)  12/05/16 162 lb (73.5 kg)    Physical Exam  Constitutional: She appears well-developed. No distress.  HENT:  Head: Normocephalic.  Right Ear: External ear normal.  Left Ear: External ear normal.  Nose: Nose normal.  Mouth/Throat: Oropharynx is clear and moist.  Eyes: Conjunctivae are normal. Pupils are equal, round, and reactive to light. Right eye exhibits no discharge. Left eye exhibits no discharge.  Neck: Normal range of motion. Neck supple. No JVD present. No tracheal deviation present. No thyromegaly present.  Cardiovascular: Normal rate, regular rhythm and normal heart sounds.  Pulmonary/Chest: No stridor. No respiratory distress. She has no wheezes.  Abdominal: Soft. Bowel sounds are normal. She exhibits no distension and no mass. There is no tenderness. There is no rebound and no guarding.  Musculoskeletal: She exhibits edema and tenderness.  Lymphadenopathy:    She has no cervical adenopathy.  Neurological: She displays normal reflexes. No cranial nerve deficit. She exhibits normal muscle tone. Coordination abnormal.  Skin: No rash noted. No erythema.  Psychiatric: She has a normal mood and affect. Her behavior is normal. Judgment and thought content normal.  R hemiparesis Obese In a w/c R foot stumb with 11x11x10 mm olcer in the center of the foot stump; warm foot   Lab Results  Component Value Date   WBC 10.9 (H) 01/13/2017   HGB 11.7 (L) 01/13/2017    HCT 37.6 01/13/2017   PLT 290 01/13/2017   GLUCOSE 179 (H) 01/13/2017   CHOL 117 08/18/2016   TRIG 64 08/18/2016   HDL 39 (L) 08/18/2016   LDLCALC 65 08/18/2016   ALT <5 (L) 01/10/2017   AST 17 01/10/2017   NA 134 (L) 01/13/2017   K 3.9 01/13/2017   CL 94 (L) 01/13/2017   CREATININE 3.70 (H) 01/13/2017   BUN 21 (H) 01/13/2017   CO2 24 01/13/2017   TSH 1.010 05/23/2016   INR 1.23 01/07/2017   HGBA1C 6.6 03/16/2016    No results found.  Assessment & Plan:   There are no diagnoses linked to this encounter. I am having [name] maintain her calcium acetate, acetaminophen, aspirin, nitroGLYCERIN, dorzolamide-timolol, glucose blood, simvastatin, pantoprazole, metoprolol tartrate, lidocaine-prilocaine, Calcium Carbonate Antacid (ALKA-SELTZER ANTACID PO), albuterol, silver sulfADIAZINE, ondansetron, cefpodoxime, oxyCODONE, and HYDROcodone-acetaminophen.  No orders of the defined types were placed in this encounter.    Follow-up: No Follow-up on file.  Walker Kehr, MD

## 2017-01-16 NOTE — Assessment & Plan Note (Signed)
Re-dressed F/u w/Dr Jacqualyn Posey

## 2017-01-16 NOTE — Assessment & Plan Note (Signed)
s/p inpatient stay for pyelonephritis (d/c on 01/13/17) - treated w/IV abx and d/c'd on Vantin (records reviewed). R foot pain and ulcer x over 1 month: severe. RLE w/o DVT per Duplex test on 01/07/17

## 2017-01-17 ENCOUNTER — Encounter: Payer: Self-pay | Admitting: *Deleted

## 2017-01-17 ENCOUNTER — Ambulatory Visit (INDEPENDENT_AMBULATORY_CARE_PROVIDER_SITE_OTHER): Payer: Medicare Other | Admitting: Vascular Surgery

## 2017-01-17 ENCOUNTER — Other Ambulatory Visit: Payer: Self-pay | Admitting: *Deleted

## 2017-01-17 ENCOUNTER — Encounter: Payer: Self-pay | Admitting: Vascular Surgery

## 2017-01-17 VITALS — BP 109/64 | HR 95 | Temp 97.0°F | Resp 16 | Ht 60.0 in | Wt 161.0 lb

## 2017-01-17 DIAGNOSIS — I70299 Other atherosclerosis of native arteries of extremities, unspecified extremity: Secondary | ICD-10-CM | POA: Diagnosis not present

## 2017-01-17 DIAGNOSIS — D509 Iron deficiency anemia, unspecified: Secondary | ICD-10-CM | POA: Diagnosis not present

## 2017-01-17 DIAGNOSIS — L97909 Non-pressure chronic ulcer of unspecified part of unspecified lower leg with unspecified severity: Secondary | ICD-10-CM | POA: Diagnosis not present

## 2017-01-17 DIAGNOSIS — D631 Anemia in chronic kidney disease: Secondary | ICD-10-CM | POA: Diagnosis not present

## 2017-01-17 DIAGNOSIS — N186 End stage renal disease: Secondary | ICD-10-CM | POA: Diagnosis not present

## 2017-01-17 DIAGNOSIS — N2581 Secondary hyperparathyroidism of renal origin: Secondary | ICD-10-CM | POA: Diagnosis not present

## 2017-01-17 NOTE — Progress Notes (Signed)
Patient name: [name] MRN: [id number] DOB: 08-Jun-1945 Sex: female   REASON FOR CONSULT:    Nonhealing wound of the right  foot.  The consult is requested by Dr. Charisse March.    HPI:   [name] is a pleasant 72 y.o. 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  She developed a blister on the plantar aspect of her foot on 12/09/2016.  This has failed to heal despite aggressive outpatient care.  She is ambulatory with a walker but has very limited activity.  I really do not get any history of claudication in either lower extremity.  She does describe some pain in the right foot at night consistent with rest pain.  She denies any history of fever or chills.  I have reviewed the records that were sent from the referring office.  The patient is status post a right transmetatarsal amputation.  The patient has a nonhealing wound of the right foot and was sent for vascular consultation.  She has been getting dressing changes with Santyl.   Past Medical History:  Diagnosis Date  . Acute respiratory failure with hypoxia ([place]) 10/23/2014  . Adenomatous duodenal polyp   . Anemia   . Arthritis    "hands" (11/17/2015)  . Atrial tachycardia ([place])    on amiod  . Cardiomyopathy- mixed   . CHF (congestive heart failure) ([place])   . Chronic diastolic heart failure ([place])   . Colon cancer ([place]) 1986  . Colostomy in place [facility])   . Coronary artery disease    Previously decreased EF; echo 113 normal LV function  . Dialysis patient ([place])   . ESRD (end stage renal disease) on dialysis [facility])    Dr Dunham/Dr. Lyda Kalata.  M, W, Fr; [place] (11/17/2015)  . GERD (gastroesophageal reflux disease)   . Glaucoma   . Heart murmur   . Hernia, incisional    abd  . Hyperlipidemia   . Hypertension   . Hypertensive heart disease    sees Dr. Alain Marion  . LBBB (left bundle branch block)   . Mitral valve insufficiency and aortic valve insufficiency   . Obesity   . Stroke  [facility])    2011/12  . Type II diabetes mellitus (HCC)     Family History  Problem Relation Age of Onset  . Hypertension Mother   . Coronary artery disease Mother   . Hypertension Father   . Diabetes Father   . Cancer Sister        colon  . Hypertension Other     SOCIAL HISTORY: Social History   Socioeconomic History  . Marital status: Widowed    Spouse name: Not on file  . Number of children: Not on file  . Years of education: Not on file  . Highest education level: Not on file  Social Needs  . Financial resource strain: Not on file  . Food insecurity - worry: Not on file  . Food insecurity - inability: Not on file  . Transportation needs - medical: Not on file  . Transportation needs - non-medical: Not on file  Occupational History  . Not on file  Tobacco Use  . Smoking status: Never Smoker  . Smokeless tobacco: Never Used  Substance and Sexual Activity  . Alcohol use: No  . Drug use: No  . Sexual activity: No  Other Topics Concern  . Not on file  Social History Narrative  . Not on file  Allergies  Allergen Reactions  . Ace Inhibitors Cough  . Eggs Or Egg-Derived Products Nausea And Vomiting  . Enalapril Cough  . Lisinopril Cough  . Omnipaque [Iohexol] Hives    Current Outpatient Medications  Medication Sig Dispense Refill  . acetaminophen (TYLENOL) 500 MG tablet Take 500-1,000 mg by mouth every 6 (six) hours as needed (pain).     [name] albuterol (PROVENTIL HFA;VENTOLIN HFA) 108 (90 Base) MCG/ACT inhaler Inhale 1-2 puffs into the lungs every 6 (six) hours as needed for wheezing or shortness of breath.    [name] aspirin 81 MG tablet Take 1 tablet (81 mg total) by mouth daily. 30 tablet 3  . calcium acetate (PHOSLO) 667 MG capsule Take 2,001-2,668 mg by mouth See admin instructions. 2,001-2,668 mg three times a day with meals    . Calcium Carbonate Antacid (ALKA-SELTZER ANTACID PO) Take 2 tablets by mouth daily as needed (for indigestion).     . cefpodoxime  (VANTIN) 200 MG tablet Take 2 tablets (400 mg total) by mouth 3 (three) times a week. 6 tablet 0  . dorzolamide-timolol (COSOPT) 22.3-6.8 MG/ML ophthalmic solution Place 1 drop into the left eye daily.   12  . glucose blood (ONE TOUCH ULTRA TEST) test strip 1 each by Other route daily. And lancets 1/day 100 each 3  . lidocaine-prilocaine (EMLA) cream Apply 1 application topically as needed (topical anesthesia for hemodialysis if Gebauers and Lidocaine injection are ineffective.). 30 g 0  . metoprolol tartrate (LOPRESSOR) 50 MG tablet Take 1 tablet (50 mg total) by mouth every Tuesday, Thursday, Saturday, and Sunday. 30 tablet 0  . nitroGLYCERIN (NITROSTAT) 0.4 MG SL tablet Place 1 tablet (0.4 mg total) under the tongue every 5 (five) minutes as needed for chest pain. 30 tablet 3  . ondansetron (ZOFRAN) 4 MG tablet Take 1 tablet (4 mg total) by mouth every 6 (six) hours as needed for nausea. 20 tablet 0  . oxyCODONE (ROXICODONE) 5 MG immediate release tablet Take 1 tablet (5 mg total) by mouth every 6 (six) hours as needed for up to 5 days for severe pain. 20 tablet 0  . pantoprazole (PROTONIX) 40 MG tablet Take 1 tablet (40 mg total) by mouth daily before breakfast. 30 tablet 11  . silver sulfADIAZINE (SILVADENE) 1 % cream Apply 1 application topically See admin instructions. 1 application to right foot once a day  2  . simvastatin (ZOCOR) 10 MG tablet Take 1 tablet (10 mg total) by mouth every evening. (Patient taking differently: Take 10 mg by mouth daily. ) 90 tablet 3   No current facility-administered medications for this visit.    Facility-Administered Medications Ordered in Other Visits  Medication Dose Route Frequency Provider Last Rate Last Dose  . mitoMYcin (MUTAMYCIN) Injection Use in OR only (0.4 mg/ml)  0.5 mL Left Eye Once Marylynn Pearson, MD        REVIEW OF SYSTEMS:  [X]  denotes positive finding, [ ]  denotes negative finding Cardiac  Comments:  Chest pain or chest pressure:      Shortness of breath upon exertion: X   Short of breath when lying flat:    Irregular heart rhythm:        Vascular    Pain in calf, thigh, or hip brought on by ambulation:    Pain in feet at night that wakes you up from your sleep:     Blood clot in your veins:    Leg swelling:  Pulmonary    Oxygen at home:    Productive cough:     Wheezing:         Neurologic    Sudden weakness in arms or legs:     Sudden numbness in arms or legs:     Sudden onset of difficulty speaking or slurred speech:    Temporary loss of vision in one eye:     Problems with dizziness:         Gastrointestinal    Blood in stool:     Vomited blood:         Genitourinary    Burning when urinating:     Blood in urine:        Psychiatric    Major depression:         Hematologic    Bleeding problems:    Problems with blood clotting too easily:        Skin    Rashes or ulcers:        Constitutional    Fever or chills:     PHYSICAL EXAM:   Vitals:   01/17/17 1016  BP: 109/64  Pulse: 95  Resp: 16  Temp: (!) 97 F (36.1 C)  TempSrc: Oral  Weight: 161 lb (73 kg)  Height: 5' (1.524 m)    GENERAL: The patient is a well-nourished female, in no acute distress. The vital signs are documented above. CARDIAC: There is a regular rate and rhythm.  She has a systolic ejection murmur. VASCULAR: I do not detect carotid bruits. She was unable to get up on the table so it was somewhat difficult to examine her in the wheelchair.  However, I do feel diminished but palpable femoral pulses.  I cannot palpate popliteal or pedal pulses.  She has mild bilateral lower extremity swelling. PULMONARY: There is good air exchange bilaterally without wheezing or rales. ABDOMEN: Soft and non-tender with normal pitched bowel sounds.  She has a urostomy on the right side of her abdomen and a colostomy on the left side of her abdomen. MUSCULOSKELETAL:  She has a right transmetatarsal amputation that is  healed. NEUROLOGIC: No focal weakness or paresthesias are detected. SKIN: She has a wound on the plantar aspect of her right foot which measures a centimeter in diameter with macerated skin around this. PSYCHIATRIC: The patient has a normal affect.  DATA:    ARTERIAL DOPPLER STUDY: I have independently interpreted her arterial Doppler study.  On the right side there is a dorsalis pedis signal which is monophasic.  Posterior tibial signal cannot be obtained.  The arteries are noncompressible so an ABI could not be obtained.  On the left side there is a triphasic dorsalis pedis signal with no posterior tibial signal again the arteries are noncompressible and an ABI cannot be obtained.  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  Given her diabetes in addition to this, she is at high risk for limb loss.  In addition she is a dialysis patient and dialyzes on Monday Wednesdays and Fridays.  Thus I think she is very high risk for limb loss.  I have discussed this with her.  She would like to save the limb if at all possible and for this reason I have recommended that we proceed with arteriography and possible intervention.  I have reviewed with the patient the  indications for arteriography. In addition, I have reviewed the potential complications of arteriography including but not limited to: Bleeding, arterial injury, arterial thrombosis, dye action, renal insufficiency, or other unpredictable medical problems. I have explained to the patient that if we find disease amenable to angioplasty we could potentially address this at the same time. I have discussed the potential complications of angioplasty and stenting, including but not limited to: Bleeding, arterial thrombosis, arterial injury, dissection, or the need for surgical  intervention.  Since she dialyzes on Monday Wednesday Friday, will have to schedule this on a Tuesday or Thursday.  I will make further recommendations pending these results.  If she is not a candidate for an endovascular approach, then she would need preoperative cardiac evaluation and a vein map before we could even consider a bypass.  I think she would be at high risk for bypass given her multiple medical comorbidities including diabetes and end-stage renal disease.  Deitra Mayo [facility] [phone]

## 2017-01-18 ENCOUNTER — Other Ambulatory Visit: Payer: Self-pay | Admitting: *Deleted

## 2017-01-18 ENCOUNTER — Encounter: Payer: Self-pay | Admitting: *Deleted

## 2017-01-18 NOTE — Patient Outreach (Signed)
[facility]) Care Management [place] Telephone Outreach  01/18/2017  [name] Jun 04, 1945 [id number]  Successful telephone outreach to [name], 72 y/o female referred to [place] by [facility] hospital Liaison RN CM after recent hospitalization January 2-5, 2019 for nausea, vomiting, flank pain; patient was diagnosed with sepsis/ pyelonephritis during hospitalization.  Patient was discharged home to self-care with home health services in place through [place] for PT/ OT.  Patient has history including, but not limited to, colon cancer with exisiting colostomy, HTN/ HLD, CKD- on HD 3 x week, previous CVA, CAD, combined CHF, Diabetes Type Ii with PVD and transmetatarsal amputation, and GERD.  HIPAA/ identity verified with patient, who provides verbal consent for [place] services today, and requests that I speak with daughter/ primary caregiver whom she lives with [name]) for follow up conversations.  Verified through EMR that patient's PCP practice completed transition of care post-hospital dsicharge phone call.  Today, [name] reports that "things are going pretty good," but she admits that she is "very tired," as she is the sole caregiver for patient.  [name] reports that patient requires "total care," and is unable "to do very much for herself."  [name] states that patient has been in pain since recent hospital discharge due to (R) foot pressure ulcer.  Patient sounded to be in no distress during phone call today.  Caregiver further reports:  Medications: -- Has all medicationsand takes as prescribed;denies questions about current medications.  -- reports unable to complete medication reconciliation today due to personal time constraints  Home health [facility]) services: -- unsure of details of [place] services ordered for patient post-hospital discharge; states [facility] PT has visited "once" but was unable to actively work with patient due to her ongoing  pain in (R) foot; states she expects another [facility] PT visit "soon;" unsure if other [facility] disciplines were ordered at time of hospital discharge -- states "currently waiting" to hear from [facility]) regarding delivery status of recently ordered DME for lift chair by PCP; encouraged caregiver to follow up with [facility], and daughter confirms that she has the phone number for [facility] -- confirms that she has the phone number for [facility] and will inquire if she doesn't hear back from them "soon"  Provider appointments: -- All recently attended provider appointments were reviewed with caregiver today: attended PCP office visit Tuesday 01/16/17; attended vascular surgeon appointment 01/18/16; continues attending established HD sessions on M-W-F -- discussed plans for upcoming arteriogram to be completed on 01/25/17; daughter reports that they were told by surgeon that patient would "most likely definitely need her foot amputated soon."  Daughter states she will provide transportation to upcoming procedure, and expects that this will be done as out-patient if "everything goes well."   Safety/ Mobility/ Falls: -- denies new/ recent falls since hospital discharge -- assistive devices: requires wheelchair "all the time" due to ongoing pain in (R) foot from decubitus ulcer -- general fall risks/ prevention education discussed with patient's caregiver today, who states that because of patient's limitations, she has 'to do everything for" patient, but she is unable to share any specific needs, as she "has been doing this so long," she "just doesn't even know" what assistance she might need.  Encouraged caregiver to consider needs for further discussion at [place] initial home visit, and she agreed to do so.  Social/ [employer] needs: -- caregiver currently denies community resource needs, stating supportive family members that assist  with care needs as indicated -- family provides transportation for patient to  all provider appointments, errands, etc  Caregiver received another phone call, and needed to complete [facility] CCM call in progress; caregiver denies further issues, concerns, or problems today.  I provided/ confirmed that patient has my direct phone number, the main [facility] CM office phone number, and the [facility] CM 24-hour nurse advice phone number should issues arise prior to next scheduled [place] outreach, with scheduled initial home visit next week.  Plan:  Patient will take medications as prescribed and will attend all provider appointments  Patient will continue using assistive devices/ wheelchair for mobility/ ambulation  Patient's caregiver will contact [place] regarding status of DME lift chair ordered during recent PCP office visit  Patient's caregiver will contact [place] regarding current services in place post-hospital discharge  I will make patient's PCP aware of [place] involvement in patient's care post-hospital discharge  [place] outreach to continue wit scheduled initial home visit next week   [facility] CM Care Plan Problem One     Most Recent Value  Care Plan Problem One  Care needs at home for DME, caregiver support in HD patient with multiple recent hospitalizations and ED visits, as evidenced by patient and caregiver reporting of same  Role Documenting the Problem One  Care Management Coordinator  Care Plan for Problem One  Active  [facility] Long Term Goal   Over the next 60 days, patient/ caregiver will verbalize plan to meet patient care needs at home in patient requiring HD three times per week, as evidenced by patient and caregiver reporting  [facility] Long Term Goal Start Date  01/18/17  Interventions for Problem One Long Term Goal  Discussed with patient's caregiver current DME and care support needs,  encouarged caregiver to continue thinking about assistance needed in home to care for patient,  [facility] RN CCM program opened and initial home visit scheduled  [facility] CM Short  Term Goal #1   Over the next 7 days, patient's caregiver will verbalize plan to ensure patient's lift chair is delivered by home health DME agency, as evidenced by caregiver reporting of same during [facility] RN CCM outreach  [facility] CM Short Term Goal #1 Start Date  01/18/17  Interventions for Short Term Goal #1  Discussed with caregiver patient's recent order for lift chair, and ensured that caregiver has phone number to home health DME agency,  encouraged caregiver to promptly follow up on status of PCP order     I appreciate the opportunity to participate in Ambert's care,  Oneta Rack, RN, BSN, [employer] Coordinator [facility] Care Management  [phone]

## 2017-01-19 DIAGNOSIS — D631 Anemia in chronic kidney disease: Secondary | ICD-10-CM | POA: Diagnosis not present

## 2017-01-19 DIAGNOSIS — N186 End stage renal disease: Secondary | ICD-10-CM | POA: Diagnosis not present

## 2017-01-19 DIAGNOSIS — D509 Iron deficiency anemia, unspecified: Secondary | ICD-10-CM | POA: Diagnosis not present

## 2017-01-19 DIAGNOSIS — N2581 Secondary hyperparathyroidism of renal origin: Secondary | ICD-10-CM | POA: Diagnosis not present

## 2017-01-22 ENCOUNTER — Emergency Department (HOSPITAL_COMMUNITY)
Admission: EM | Admit: 2017-01-22 | Discharge: 2017-01-22 | Disposition: A | Discharge status: Home / Self Care | Payer: Medicare Other | Attending: Emergency Medicine | Admitting: Emergency Medicine

## 2017-01-22 ENCOUNTER — Other Ambulatory Visit: Payer: Self-pay

## 2017-01-22 ENCOUNTER — Encounter (HOSPITAL_COMMUNITY): Payer: Self-pay

## 2017-01-22 ENCOUNTER — Emergency Department (HOSPITAL_COMMUNITY): Payer: Medicare Other

## 2017-01-22 ENCOUNTER — Ambulatory Visit: Payer: Medicare Other | Admitting: Family

## 2017-01-22 DIAGNOSIS — I5032 Chronic diastolic (congestive) heart failure: Secondary | ICD-10-CM | POA: Insufficient documentation

## 2017-01-22 DIAGNOSIS — I120 Hypertensive chronic kidney disease with stage 5 chronic kidney disease or end stage renal disease: Secondary | ICD-10-CM | POA: Diagnosis not present

## 2017-01-22 DIAGNOSIS — R531 Weakness: Secondary | ICD-10-CM

## 2017-01-22 DIAGNOSIS — M6281 Muscle weakness (generalized): Secondary | ICD-10-CM | POA: Diagnosis not present

## 2017-01-22 DIAGNOSIS — Z8673 Personal history of transient ischemic attack (TIA), and cerebral infarction without residual deficits: Secondary | ICD-10-CM | POA: Insufficient documentation

## 2017-01-22 DIAGNOSIS — R0602 Shortness of breath: Secondary | ICD-10-CM | POA: Insufficient documentation

## 2017-01-22 DIAGNOSIS — R5383 Other fatigue: Secondary | ICD-10-CM | POA: Insufficient documentation

## 2017-01-22 DIAGNOSIS — E1122 Type 2 diabetes mellitus with diabetic chronic kidney disease: Secondary | ICD-10-CM | POA: Insufficient documentation

## 2017-01-22 DIAGNOSIS — Z7982 Long term (current) use of aspirin: Secondary | ICD-10-CM | POA: Insufficient documentation

## 2017-01-22 DIAGNOSIS — N186 End stage renal disease: Secondary | ICD-10-CM | POA: Diagnosis not present

## 2017-01-22 DIAGNOSIS — R079 Chest pain, unspecified: Secondary | ICD-10-CM | POA: Diagnosis not present

## 2017-01-22 DIAGNOSIS — I2510 Atherosclerotic heart disease of native coronary artery without angina pectoris: Secondary | ICD-10-CM | POA: Insufficient documentation

## 2017-01-22 DIAGNOSIS — Z85038 Personal history of other malignant neoplasm of large intestine: Secondary | ICD-10-CM | POA: Diagnosis not present

## 2017-01-22 DIAGNOSIS — Z79899 Other long term (current) drug therapy: Secondary | ICD-10-CM | POA: Insufficient documentation

## 2017-01-22 DIAGNOSIS — Z992 Dependence on renal dialysis: Secondary | ICD-10-CM | POA: Diagnosis not present

## 2017-01-22 DIAGNOSIS — I132 Hypertensive heart and chronic kidney disease with heart failure and with stage 5 chronic kidney disease, or end stage renal disease: Secondary | ICD-10-CM | POA: Diagnosis not present

## 2017-01-22 DIAGNOSIS — R069 Unspecified abnormalities of breathing: Secondary | ICD-10-CM | POA: Diagnosis not present

## 2017-01-22 LAB — COMPREHENSIVE METABOLIC PANEL
ALT: 11 U/L — ABNORMAL LOW (ref 14–54)
ANION GAP: 17 — AB (ref 5–15)
AST: 17 U/L (ref 15–41)
Albumin: 2.6 g/dL — ABNORMAL LOW (ref 3.5–5.0)
Alkaline Phosphatase: 158 U/L — ABNORMAL HIGH (ref 38–126)
BUN: 28 mg/dL — ABNORMAL HIGH (ref 6–20)
CHLORIDE: 95 mmol/L — AB (ref 101–111)
CO2: 24 mmol/L (ref 22–32)
Calcium: 8.6 mg/dL — ABNORMAL LOW (ref 8.9–10.3)
Creatinine, Ser: 5.1 mg/dL — ABNORMAL HIGH (ref 0.44–1.00)
GFR, EST AFRICAN AMERICAN: 9 mL/min — AB (ref >60)
GFR, EST NON AFRICAN AMERICAN: 8 mL/min — AB (ref >60)
Glucose, Bld: 205 mg/dL — ABNORMAL HIGH (ref 65–99)
POTASSIUM: 4 mmol/L (ref 3.5–5.1)
Sodium: 136 mmol/L (ref 135–145)
TOTAL PROTEIN: 7.3 g/dL (ref 6.5–8.1)
Total Bilirubin: 0.9 mg/dL (ref 0.3–1.2)

## 2017-01-22 LAB — I-STAT TROPONIN, ED: Troponin i, poc: 0.05 ng/mL (ref 0.00–0.08)

## 2017-01-22 LAB — URINALYSIS, ROUTINE W REFLEX MICROSCOPIC
BILIRUBIN URINE: NEGATIVE
GLUCOSE, UA: NEGATIVE mg/dL
KETONES UR: NEGATIVE mg/dL
LEUKOCYTES UA: NEGATIVE
NITRITE: NEGATIVE
Specific Gravity, Urine: 1.02 (ref 1.005–1.030)
pH: 8.5 — ABNORMAL HIGH (ref 5.0–8.0)

## 2017-01-22 LAB — CBC WITH DIFFERENTIAL/PLATELET
BASOS ABS: 0 10*3/uL (ref 0.0–0.1)
Basophils Relative: 0 %
EOS PCT: 0 %
Eosinophils Absolute: 0 10*3/uL (ref 0.0–0.7)
HCT: 34.4 % — ABNORMAL LOW (ref 36.0–46.0)
Hemoglobin: 10.9 g/dL — ABNORMAL LOW (ref 12.0–15.0)
LYMPHS ABS: 0.9 10*3/uL (ref 0.7–4.0)
LYMPHS PCT: 10 %
MCH: 26.5 pg (ref 26.0–34.0)
MCHC: 31.7 g/dL (ref 30.0–36.0)
MCV: 83.7 fL (ref 78.0–100.0)
Monocytes Absolute: 0.7 10*3/uL (ref 0.1–1.0)
Monocytes Relative: 7 %
Neutro Abs: 7.9 10*3/uL — ABNORMAL HIGH (ref 1.7–7.7)
Neutrophils Relative %: 83 %
PLATELETS: 309 10*3/uL (ref 150–400)
RBC: 4.11 MIL/uL (ref 3.87–5.11)
RDW: 17.1 % — ABNORMAL HIGH (ref 11.5–15.5)
WBC: 9.6 10*3/uL (ref 4.0–10.5)

## 2017-01-22 LAB — URINALYSIS, MICROSCOPIC (REFLEX)

## 2017-01-22 LAB — I-STAT CG4 LACTIC ACID, ED: LACTIC ACID, VENOUS: 3.3 mmol/L — AB (ref 0.5–1.9)

## 2017-01-22 NOTE — Discharge Instructions (Signed)
Please read and follow all provided instructions.  Your diagnoses today include:  1. Generalized weakness   2. ESRD (end stage renal disease) ([place])     Tests performed today include:  Chest x-ray -no signs of fluid in your lungs or pneumonia  Blood counts and electrolytes -normal infection fighting cells  EKG and blood test for the heart -no heart attack  Vital signs. See below for your results today.   Medications prescribed:   None  Take any prescribed medications only as directed.  Home care instructions:  Follow any educational materials contained in this packet.  Follow-up instructions: Call your dialysis center today to schedule an appointment to be dialyzed tomorrow.  Please follow-up with your primary care provider in the next 2 days for further evaluation of your symptoms.   Return instructions:   Please return to the Emergency Department if you experience worsening symptoms.   Patient counseled to return if they have weakness in their arms or legs, slurred speech, trouble walking or talking, confusion, trouble with their balance, or if they have any other concerns. Patient verbalizes understanding and agrees with plan.   Please return if you have any other emergent concerns.  Additional Information:  Your vital signs today were: BP (!) 124/59    Pulse 71    Temp 97.9 F (36.6 C) (Oral)    Resp (!) 21    SpO2 100%  If your blood pressure (BP) was elevated above 135/85 this visit, please have this repeated by your doctor within one month. --------------

## 2017-01-22 NOTE — ED Provider Notes (Signed)
Complains of generalized fatigue onset several weeks ago.  She has been unable to stand or walk for several weeks.  She had slightly increased shortness of breath last night.,  Breathing is normal presently.  Denies other complaint.  On exam no respiratory distress, speaks in paragraphs lungs  with scant crackles posteriorly heart regular rate and rhythm abdomen obese nontender right lower extremity partially amputated foot with dime sized clean appearing ulcer at plantar surface of foot all other extremities without redness swelling or tenderness neurovascular intact left upper extremity with dialysis fistula with good thrill, nontender   Orlie Dakin, MD 01/22/17 1352

## 2017-01-22 NOTE — ED Triage Notes (Signed)
[facility]- pt coming from home with complaint of not feeling well. She is MWF HD pt and did not go today. Pt aoX4. Also reported some shortness of breath to EMS. Pt recently admitted and discharged for sepsis. Has wound to left foot. Dialysis access in left arm.

## 2017-01-22 NOTE — ED Provider Notes (Signed)
[facility] [place] EMERGENCY DEPARTMENT Provider Note   CSN: [id number] Arrival date & time: 01/22/17  1141     History   Chief Complaint Chief Complaint  Patient presents with  . Fatigue    HPI [name] is a 72 y.o. female.   Patient with history of end-stage renal disease, hemodialysis Monday/Wednesday/Friday, recent admission to the hospital for suspected urosepsis, recently finished antibiotics for this, known chronic right foot ulcer an previous partial amputation with upcoming angiography of the leg, CHF, cardiomyopathy, history of stroke --presents with complaint of shortness of breath, fatigue, generalized weakness.  Also more confused than baseline per daughter today.  Shortness of breath occurred overnight last night.  She did not have associated chest pain, diaphoresis, vomiting.  She generally felt unwell.  Daughter states that she typically ambulates with a walker but has needed significant help with ambulation since recent discharge.  Patient has a colostomy and urostomy and still makes urine which she reports is clear.  No change from chronic right foot ulceration.  Patient denies fever.  She has had some nasal congestion but no significant cough.  Patient states that she did not feel well enough to go to dialysis today.  No increasing lower extremity edema or orthopnea. The onset of this condition was acute on chronic. The course is constant. Aggravating factors: none. Alleviating factors: none.        Past Medical History:  Diagnosis Date  . Acute respiratory failure with hypoxia ([place]) 10/23/2014  . Adenomatous duodenal polyp   . Anemia   . Arthritis    "hands" (11/17/2015)  . Atrial tachycardia ([place])    on amiod  . Cardiomyopathy- mixed   . CHF (congestive heart failure) ([place])   . Chronic diastolic heart failure ([place])   . Colon cancer ([place]) 1986  . Colostomy in place [facility])   . Coronary artery disease    Previously decreased EF; echo 113  normal LV function  . Dialysis patient ([place])   . ESRD (end stage renal disease) on dialysis [facility])    Dr Dunham/Dr. Lyda Kalata.  M, W, Fr; [place] (11/17/2015)  . GERD (gastroesophageal reflux disease)   . Glaucoma   . Heart murmur   . Hernia, incisional    abd  . Hyperlipidemia   . Hypertension   . Hypertensive heart disease    sees Dr. Alain Marion  . LBBB (left bundle branch block)   . Mitral valve insufficiency and aortic valve insufficiency   . Obesity   . Stroke [facility])    2011/12  . Type II diabetes mellitus [facility])     Patient Active Problem List   Diagnosis Date Noted  . Foot pain, right 01/16/2017  . Skin ulcer due to diabetes mellitus ([place]) 01/16/2017  . Pyelonephritis 01/16/2017  . Sepsis secondary to UTI ([place]) 01/10/2017  . Syncope 11/03/2016  . PEA (Pulseless electrical activity) ([place])   . Chronic systolic CHF (congestive heart failure) ([place])   . Postprocedural hypotension   . End-stage renal disease on hemodialysis ([place])   . Cardiac arrest ([place]) 08/17/2016  . Erosive esophagitis   . Esophageal dysphagia   . Hematemesis with nausea   . Gastritis and gastroduodenitis   . Hard of hearing 04/25/2016  . GERD (gastroesophageal reflux disease) 02/08/2016  . Loss of weight 01/18/2016  . Atrial tachycardia ([place]) 11/17/2015  . Diabetes ([place]) 05/13/2015  . Ischemic cardiomyopathy   . Non-ST elevated myocardial infarction (non-STEMI) ([place])   . Dyspnea on exertion  10/23/2014  . Elevated troponin 10/23/2014  . Congestive heart failure (CHF) ([place]) 10/23/2014  . GI bleed 02/02/2013  . Chest pain 02/01/2013  . S/P transmetatarsal amputation of foot ([place]) 01/31/2013  . History of colon cancer   . Obesity   . ESRD on dialysis ([place]) 05/28/2012  . Nausea 05/21/2011  . Anemia 05/21/2011  . Coronary artery disease   . Foot ulcer ([place]) 09/29/2010  . CVA (cerebral vascular accident) ([place]) 06/01/2010  . Acute on chronic systolic and diastolic heart failure, NYHA class 1 ([place])  01/27/2009  . Atrial tachycardia    . LBBB (left bundle branch block) 04/10/2008  . Hyperlipidemia   . Hypertensive heart disease     Past Surgical History:  Procedure Laterality Date  . ARTERIOVENOUS GRAFT PLACEMENT Left 2010   "forearm"  . BALLOON DILATION N/A 08/17/2016   Procedure: POSSIBLE BALLOON DILATION POSSIBLE MALONEY;  Surgeon: Gatha Mayer, MD;  Location: [facility] ENDOSCOPY;  Service: Endoscopy;  Laterality: N/A;  . CARDIAC CATHETERIZATION    . CARDIAC CATHETERIZATION N/A 10/27/2014   Procedure: Left Heart Cath and Coronary Angiography;  Surgeon: Leonie Man, MD;  Location: [place] CV LAB;  Service: Cardiovascular;  Laterality: N/A;  . CARDIAC CATHETERIZATION N/A 10/28/2014   Procedure: Coronary Stent Intervention;  Surgeon: Peter M Martinique, MD;  Location: [place] CV LAB;  Service: Cardiovascular;  Laterality: N/A;  . CARDIAC CATHETERIZATION Bilateral 12/2008   R. heart cath showed elevated left and right heart filling pressures w/ pulmonary artery pressure elevated mildly out of proportion to the wedge. The left heart cath showed diffuse distal vessel disease as well as a 75% stenosis in the mid circumflex w/ a 90% stenosis of the ostial first obtuse marginal. These lesions were in close proximity. there was a 60-70%mild RCA stenosis.   [name] CATARACT EXTRACTION W/ INTRAOCULAR LENS  IMPLANT, BILATERAL Bilateral   . COLON SURGERY    . COLONOSCOPY    . COLOSTOMY  1986  . ELECTROCARDIOGRAM  04-27-06  . ELECTROPHYSIOLOGIC STUDY N/A 11/17/2015   Procedure: A-Tach Ablation;  Surgeon: Will Meredith Leeds, MD;  Location: [place] CV LAB;  Service: Cardiovascular;  Laterality: N/A;  . ESOPHAGOGASTRODUODENOSCOPY  03-18-04  . ESOPHAGOGASTRODUODENOSCOPY N/A 02/02/2013   Procedure: ESOPHAGOGASTRODUODENOSCOPY (EGD);  Surgeon: Irene Shipper, MD;  Location[facility] ENDOSCOPY;  Service: Endoscopy;  Laterality: N/A;  . ESOPHAGOGASTRODUODENOSCOPY (EGD) WITH PROPOFOL N/A 08/17/2016   Procedure:  ESOPHAGOGASTRODUODENOSCOPY (EGD) WITH PROPOFOL;  Surgeon: Gatha Mayer, MD;  Location: [facility] ENDOS[facility];  Service: Endoscopy;  Laterality: N/A;   NO XRAY NEEDED  . EYE SURGERY    . FOOT AMPUTATION THROUGH METATARSAL  10-07-10   Right foot transmetatarsal  . INSERTION OF AHMED VALVE Right 08/20/2013   Procedure: INSERTION OF AHMED VALVE WITH Mitomycin C application;  Surgeon: Marylynn Pearson, MD;  Location: [facility];  Service: Ophthalmology;  Laterality: Right;  . INSERTION OF AHMED VALVE Left 07/22/2014   Procedure: INSERTION OF AHMED VALVE WITH [place];  Surgeon: Marylynn Pearson, MD;  Location: [facility];  Service: Ophthalmology;  Laterality: Left;  . LEFT HEART CATH AND CORONARY ANGIOGRAPHY N/A 08/18/2016   Procedure: LEFT HEART CATH AND CORONARY ANGIOGRAPHY;  Surgeon: Martinique, Peter M, MD;  Location: [place] CV LAB;  Service: Cardiovascular;  Laterality: N/A;  . MITOMYCIN C APPLICATION Left 07/19/6267   Procedure: MITOMYCIN C APPLICATION;  Surgeon: Marylynn Pearson, MD;  Location: [facility];  Service: Ophthalmology;  Laterality: Left;  . PARS PLANA VITRECTOMY  11/29/2011  Procedure: PARS PLANA VITRECTOMY WITH 23 GAUGE;  Surgeon: Adonis Brook, MD;  Location: [facility];  Service: Ophthalmology;  Laterality: Right;  Right Eye 23 ga vitrectomy with membrane peel  . PARS PLANA VITRECTOMY Left 02/28/2012   Procedure: PARS PLANA VITRECTOMY WITH 23 GAUGE;  Surgeon: Adonis Brook, MD;  Location: [facility];  Service: Ophthalmology;  Laterality: Left;  . REVISION UROSTOMY CUTANEOUS    . SUPRAVENTRICULAR TACHYCARDIA ABLATION  11/17/2015  . VAGINAL HYSTERECTOMY      OB History    No data available       Home Medications    Prior to Admission medications   Medication Sig Start Date End Date Taking? Authorizing Provider  acetaminophen (TYLENOL) 500 MG tablet Take 500-1,000 mg by mouth every 6 (six) hours as needed (pain).     [provider]  albuterol (PROVENTIL HFA;VENTOLIN HFA) 108 (90 Base) MCG/ACT inhaler  Inhale 1-2 puffs into the lungs every 6 (six) hours as needed for wheezing or shortness of breath.    [provider]  aspirin 81 MG tablet Take 1 tablet (81 mg total) by mouth daily. Patient taking differently: Take 81 mg by mouth every Tuesday, Thursday, Saturday, and Sunday.  10/29/14   Dhungel, Flonnie Overman, MD  calcium acetate (PHOSLO) 667 MG capsule Take 667-2,001 mg by mouth See admin instructions. Take 3 capsules (2001 mg) with each meal & 1-2 capsule ([id number] mg) by mouth with snacks.    [provider]  Calcium Carbonate Antacid (ALKA-SELTZER ANTACID PO) Take 2 tablets by mouth daily as needed (for indigestion).     [provider]  cefpodoxime (VANTIN) 200 MG tablet Take 2 tablets (400 mg total) by mouth 3 (three) times a week. 01/15/17   Danford, Suann Larry, MD  dorzolamide-timolol (COSOPT) 22.3-6.8 MG/ML ophthalmic solution Place 1 drop into the left eye daily.  11/09/15   [provider]  glucose blood (ONE TOUCH ULTRA TEST) test strip 1 each by Other route daily. And lancets 1/day 03/16/16   Renato Shin, MD  lidocaine-prilocaine (EMLA) cream Apply 1 application topically as needed (topical anesthesia for hemodialysis if Gebauers and Lidocaine injection are ineffective.). 08/20/16   Allie Bossier, MD  metoprolol tartrate (LOPRESSOR) 50 MG tablet Take 1 tablet (50 mg total) by mouth every Tuesday, Thursday, Saturday, and Sunday. Patient taking differently: Take 50 mg by mouth every Tuesday, Thursday, Saturday, and Sunday. In the morning. 08/20/16   Allie Bossier, MD  nitroGLYCERIN (NITROSTAT) 0.4 MG SL tablet Place 1 tablet (0.4 mg total) under the tongue every 5 (five) minutes as needed for chest pain. 10/29/14   Dhungel, Nishant, MD  ondansetron (ZOFRAN) 4 MG tablet Take 1 tablet (4 mg total) by mouth every 6 (six) hours as needed for nausea. 01/13/17   Danford, Suann Larry, MD  pantoprazole (PROTONIX) 40 MG tablet Take 1 tablet (40 mg total) by mouth  daily before breakfast. Patient not taking: Reported on 01/18/2017 07/25/16   Gatha Mayer, MD  simvastatin (ZOCOR) 10 MG tablet Take 1 tablet (10 mg total) by mouth every evening. Patient taking differently: Take 10 mg by mouth daily.  05/23/16   Camnitz, Ocie Doyne, MD    Family History Family History  Problem Relation Age of Onset  . Hypertension Mother   . Coronary artery disease Mother   . Hypertension Father   . Diabetes Father   . Cancer Sister        colon  . Hypertension Other  Social History Social History   Tobacco Use  . Smoking status: Never Smoker  . Smokeless tobacco: Never Used  Substance Use Topics  . Alcohol use: No  . Drug use: No     Allergies   Ace inhibitors; Eggs or egg-derived products; Enalapril; Lisinopril; and Omnipaque [iohexol]   Review of Systems Review of Systems  Constitutional: Negative for fever.  HENT: Positive for congestion. Negative for rhinorrhea and sore throat.   Eyes: Negative for redness.  Respiratory: Positive for shortness of breath. Negative for cough and wheezing.   Cardiovascular: Negative for chest pain.  Gastrointestinal: Negative for abdominal pain, diarrhea, nausea and vomiting.  Genitourinary: Negative for dysuria.  Musculoskeletal: Negative for myalgias.  Skin: Positive for wound. Negative for rash.  Neurological: Negative for headaches.     Physical Exam Updated Vital Signs BP (!) 110/58 (BP Location: Right Arm)   Pulse 96   Temp 97.9 F (36.6 C) (Oral)   Resp 20   SpO2 100%   Physical Exam  Constitutional: She is oriented to person, place, and time. She appears well-developed and well-nourished.  HENT:  Head: Normocephalic and atraumatic.  Mouth/Throat: Oropharynx is clear and moist.  Eyes: Conjunctivae are normal. Right eye exhibits no discharge. Left eye exhibits no discharge.  Neck: Normal range of motion. Neck supple.  Cardiovascular: Normal rate, regular rhythm and normal heart sounds.    No murmur heard. Pulmonary/Chest: Effort normal. No stridor. No respiratory distress. She has no wheezes. She has rales (bibasilar).  Abdominal: Soft. There is no tenderness. There is no rebound and no guarding.  Musculoskeletal: She exhibits no edema or tenderness.  Chronic appearing, dime-sized, right foot ulcer on sole of foot status post partial amputation of foot.  Scant clear yellow drainage on bandage.  Neurological: She is alert and oriented to person, place, and time. She has normal strength. No cranial nerve deficit or sensory deficit.  Skin: Skin is warm and dry.  Psychiatric: She has a normal mood and affect.  Nursing note and vitals reviewed.    ED Treatments / Results  Labs (all labs ordered are listed, but only abnormal results are displayed) Labs Reviewed  CBC WITH DIFFERENTIAL/PLATELET - Abnormal; Notable for the following components:      Result Value   Hemoglobin 10.9 (*)    HCT 34.4 (*)    RDW 17.1 (*)    Neutro Abs 7.9 (*)    All other components within normal limits  COMPREHENSIVE METABOLIC PANEL - Abnormal; Notable for the following components:   Chloride 95 (*)    Glucose, Bld 205 (*)    BUN 28 (*)    Creatinine, Ser 5.10 (*)    Calcium 8.6 (*)    Albumin 2.6 (*)    ALT 11 (*)    Alkaline Phosphatase 158 (*)    GFR calc non Af Amer 8 (*)    GFR calc Af Amer 9 (*)    Anion gap 17 (*)    All other components within normal limits  URINALYSIS, ROUTINE W REFLEX MICROSCOPIC - Abnormal; Notable for the following components:   APPearance CLOUDY (*)    pH 8.5 (*)    Hgb urine dipstick LARGE (*)    Protein, ur >300 (*)    All other components within normal limits  URINALYSIS, MICROSCOPIC (REFLEX) - Abnormal; Notable for the following components:   Bacteria, UA MANY (*)    Squamous Epithelial / LPF 0-5 (*)    All other components within normal  limits  I-STAT CG4 LACTIC ACID, ED - Abnormal; Notable for the following components:   Lactic Acid, Venous 3.30  (*)    All other components within normal limits  URINE CULTURE  I-STAT TROPONIN, ED     Radiology Dg Chest 2 View  Result Date: 01/22/2017 CLINICAL DATA:  Chest pain and shortness of breath. EXAM: CHEST  2 VIEW COMPARISON:  01/07/2017 and 07/23/2016 FINDINGS: There is chronic cardiomegaly. Pulmonary vascularity is normal with the lungs are clear. Aortic atherosclerosis. IMPRESSION: Chronic cardiomegaly.  No acute abnormalities. Aortic atherosclerosis. Electronically Signed   By: Lorriane Shire M.D.   On: 01/22/2017 13:13    Procedures Procedures (including critical care time)  Medications Ordered in ED Medications - No data to display   Initial Impression / Assessment and Plan / ED Course  I have reviewed the triage vital signs and the nursing notes.  Pertinent labs & imaging results that were available during my care of the patient were reviewed by me and considered in my medical decision making (see chart for details).     Patient seen and examined. Work-up initiated.   Vital signs reviewed and are as follows: BP (!) 110/58 (BP Location: Right Arm)   Pulse 96   Temp 97.9 F (36.6 C) (Oral)   Resp 20   SpO2 100%   Pt discussed with Dr. Winfred Leeds who will see patient when she returns from x-ray.   2:18 PM lactate somewhat elevated today but patient is a dialysis patient.  She does not have other signs of infection including fever or elevated white blood cell count.  Do not suspect significant infection today for this reason.  Troponin is not elevated.  Chest x-ray does not show any evidence of pulmonary edema or pneumonia.  Lab work demonstrates normal potassium.  Patient is followed typically by Dr. Joelyn Oms.  She dialyzes at [facility] dialysis center.  I reviewed results with Dr. Hollie Salk who is on-call for [place] [facility] today.  We agree that at this time, there does not seem to be any urgent indications for admission or urgent dialysis.  Patient and daughter  updated.  They are agreeable with the plan.  Strongly encouraged to call dialysis center today to schedule a slot for dialysis tomorrow.  Encouraged return to the emergency department with any worsening or changing symptoms including chest pain or shortness of breath, fevers, new symptoms or other concerns.  They verbalized understanding and agree with plan.  Final Clinical Impressions(s) / ED Diagnoses   Final diagnoses:  Generalized weakness  ESRD (end stage renal disease) ([place])   Weakness: Patient with no focal neuro deficits.  Likely element of deconditioning from recent hospitalization and sepsis. No evidence of no strong evidence of infection today.  Elevated lactate noted, however anticipated as patient is end-stage renal disease.  She does not have an elevated white count like she did at last admission for sepsis or a fever today.  She does not have any pain to suggest intra-abdominal etiology or recurrent pyelonephritis.  Clinically she appears well and nontoxic in appearance.  No persistent tachycardia, hypoxia, or chest pain to suggest PE.  Troponin is negative.  No evidence of ischemia or EKG changes suggestive of ischemia.  ESRD: Patient did miss hemodialysis today.  No pulmonary edema on chest x-ray.  Potassium equals 4.0.  Discussed with nephrology.  No indications for emergent dialysis.  Patient to follow-up with her dialysis center tomorrow.  No dangerous or life-threatening conditions suspected or identified by  history, physical exam, and by work-up. No indications for hospitalization identified.      ED Discharge Orders    None      Carlisle Cater, PA-C 01/22/17 1519  Carlisle Cater, PA-C 01/22/17 [place], MD 01/22/17 [phone]

## 2017-01-22 NOTE — ED Notes (Signed)
Covered pt's foot wound with non-adherent ([place]a) and wrapped it with stretch gauze, per Josh - PA.

## 2017-01-22 NOTE — ED Notes (Signed)
RN notified daughter pt is ready for discharge.

## 2017-01-23 ENCOUNTER — Other Ambulatory Visit: Payer: Self-pay | Admitting: *Deleted

## 2017-01-23 ENCOUNTER — Encounter: Payer: Self-pay | Admitting: *Deleted

## 2017-01-23 DIAGNOSIS — E1122 Type 2 diabetes mellitus with diabetic chronic kidney disease: Secondary | ICD-10-CM | POA: Diagnosis not present

## 2017-01-23 DIAGNOSIS — N390 Urinary tract infection, site not specified: Secondary | ICD-10-CM | POA: Diagnosis not present

## 2017-01-23 DIAGNOSIS — I132 Hypertensive heart and chronic kidney disease with heart failure and with stage 5 chronic kidney disease, or end stage renal disease: Secondary | ICD-10-CM | POA: Diagnosis not present

## 2017-01-23 DIAGNOSIS — I5022 Chronic systolic (congestive) heart failure: Secondary | ICD-10-CM | POA: Diagnosis not present

## 2017-01-23 DIAGNOSIS — I2510 Atherosclerotic heart disease of native coronary artery without angina pectoris: Secondary | ICD-10-CM | POA: Diagnosis not present

## 2017-01-23 DIAGNOSIS — N186 End stage renal disease: Secondary | ICD-10-CM | POA: Diagnosis not present

## 2017-01-23 LAB — URINE CULTURE

## 2017-01-23 NOTE — Patient Outreach (Signed)
[facility]) Care Management  [place] Initial Home Visit 01/23/2017  [name] 1945-06-18 [id number]  [name] is an 72 y.o. female referred to [place] by [facility] hospital Liaison RN CM after recent hospitalization January 2-5, 2019 for nausea, vomiting, flank pain; patient was diagnosed with sepsis/ pyelonephritis during hospitalization.  Patient was discharged home to self-care with home health services in place through [place] for PT/ OT.  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  HIPAA/ identity verified with patient in person today, and patient's daughter/ caregiver, [name], on [facility] CM written consent, is present in the home today and actively participates in all aspects of home visit.  Pleasant 90 minute home visit.  Today, patient reports that she is "doing good," and she denies pain, discomfort, or concerns, and appears to be in no obvious/ apparent distress throughout today's home visit.  Patient acknowledged ED visit yesterday, stating she "could not take the pain" in her (R) foot due to presence of decubitus ulcer on the sole of her foot.  Patient missed her scheduled HD appointment yesterday due to being in ED; caregiver/ patient reports patient will attend HD tomorrow as scheduled, as HD facility had no available openings today to dialyze patient.  Subjective: "I know my foot is bad, but I hope they can do something to make it well again... No matter what, I don't ever want to have my foot amputated, even if that means it will kill me."  Assessment:  Patient is recuperating fair after recent hospitalization, but continues having issues with pain and limited mobility around her chronic (R) foot sole decubitus ulcer; this wound appears to be extensive and  is progressing according to caregiver reports.  Patient's daughter/ caregiver currently solely provides all patient's daily care needs, and transportation needs, which are extensive.  Caregiver is interested in obtaining information regarding community resource needs available to patient for ongoing care needs.  Patient is adamant today that she does not wish to have any more amputations, despite that her surgeon has told her she may need amputation to survive.    Patient / daughter- caregiver [name] further report:  Medications: -- Has all medicationsand takes as prescribed;denies questions about current medications.  -- denies concerns with obtaining patient's medications due to affordability/ access -- denies issues with swallowing medications -- caregiver reports accurate/ good general understanding of the purpose, dosing, and scheduling of patient's medications -- patient was recently discharged from hospital and all medications were thoroughly reviewed in person today with patient and caregiver.  Home health [facility]) services: -- previously reported waiting to hear from [facility]) regarding delivery status of recently ordered DME for lift chair by PCP; today, caregiver reports that she did follow up on this order, but is currently unable to afford the out-of-pocket cost for lift chair; states "there is no way we can afford that right now." -- confirms that home health PT and OT are currently active; during home visit today, [place] PT also visited and reported to [place] unsure of time of involvement, as they are just getting started with [facility] services -- patient actively participated in [facility] PT today around minimal ability to bear weight due to (R) foot sole decubitus ulcer  Provider appointments: -- All recently attended  provider appointments were reviewed with patient and caregiver today: attended PCP office visit Tuesday 01/16/17; attended vascular surgeon appointment 01/18/16;  continues attending established HD sessions on M-W-F -- discussed plans for upcoming arteriogram to be completed on 01/25/17; daughter again reports that they were told by surgeon that patient would "most likely definitely need her foot amputated soon," to which patient stated she "does not want any amputations, now or ever." -- Daughter states she will provide transportation to upcoming procedure, HD appointments, although she acknowledges that getting patient in and out of car with patient's limited mobility is "very difficult" for caregiver, as she solely provides all care for patient  Safety/ Mobility/ Falls: -- denies new/ recent falls since hospital discharge; history of falls in past -- assistive devices: requires wheelchair "all the time" due to ongoing pain in (R) foot from decubitus ulcer; uses walker to pivot into wheelchair -- no obvious fall hazards/ risks noted in patient's home environment; home is clean and uncluttered; patient's biggest fall risk is limited mobility around (R) foot sole decubitus ulcer  -- general fall risks/ prevention education discussed with patient and caregiver today; acknowledged with both that patient's biggest fall risk is her ongoing limited mobility related to decubitus ulcer on (R) foot sole  Social/ [employer] needs: -- caregiver previously denied community resource needs, stating supportive family members that assist with care needs as indicated; however. Today, patient and caregiver clarify that there is limited family available to assist in patient's care; daughter [name] is primary caregiver and solely provides all of patient's care requirements.  Patient essentially requires total care and daughter reports that she is "very tired," and is concerned that she may end up "getting hurt herself" as she has to lift patient into wheelchair, as patient is unable to provide any substantial assistance with mobility.  Daughter is obviously tired and is teary  at times throughout visit, stating she is "trying to do the best" she can to meet patient's daily care needs, transport to and from HD and provider appointments, etc. -- daughter provides transportation for patient to all provider appointments, errands, etc; states that she "does not believe" patient would qualify for medicaid, stating that she looked into this "a long time ago."  Daughter reports that she previously looked into [facility] transportation services, but stated that the HD facility is 5 minutes away" from their home, and that the [facility] bus "kept her on the bus for 1-2 hours around her HD sessions; reports the long bus ride was not tolerated by patient; I was unable to determine through conversation with caregiver if patient is currently active with [facility] transportation services -- level of care needs discussed with patient and caregiver, and both agree that patient has complex daily care needs around mobility, (R) foot daily dressing changes, overall hygiene, medication, and meal provision care needs; discussed possibility of [facility] CSW referral for evaluation of care assistance in home and discussion with patient/ caregiver of level of care needs, and this was agreed upon by patient and caregiver today; will place [facility] CSW referral.  Advanced Directive (AD) Planning: -- patient currently maintains that she wishes to be "full" resuscitation -- patient acknowledges that her (R) foot wound "is bad," and that her "doctor may want to do an amputation;" however, today, patient is insitant that she "does not want anything to do with any more amputations;" patient had previous amputation of (R) foot metatarsal/ toes -- thoroughly discussed with patient and caregiver AD planning packet and encouraged  patient and caregiver to begin discussing patient's specific wishes around AD planning; "Hard Choices for Loving People" was also provided to patient's daughter- caregiver as a resource to guide further conversation  with patient.  Patient and caregiver deny further issues, concerns, or problems today. I provided/ confirmed that patient hasmy direct phone number, the main [place] office phone number, and the [facility] CM 24-hour nurse advice phone number should issues arise prior to next scheduled [place] outreach, with scheduled phone call next week, post arteriogram on 01/25/17.  Objective:   BP (!) 108/58   Pulse (!) 58   Resp 16   Wt 162 lb (73.5 kg) Comment: per patient report, "HD weight"  BMI 31.64 kg/m  Unable to obtain SaO2 presumably due to patient's cool hands   Review of Systems  Constitutional: Positive for malaise/fatigue.  Eyes:       Patient reports chronic decreased vision due to glaucoma  Respiratory: Negative.  Negative for cough, shortness of breath and wheezing.   Cardiovascular: Negative.  Negative for chest pain, palpitations and leg swelling.  Gastrointestinal: Negative.  Negative for abdominal pain and nausea.  Genitourinary:       On HD M-W-F; reports has been on HD "since around 2011"  Musculoskeletal: Positive for myalgias. Negative for falls.       History of previous falls; no recent falls reported today; uses walker to pivot, then wheelchair dependent related to (R) foot sole decubitus ulcer  Skin:       (R) foot sole has open sore/ decubitus ulcer, draining thick pruritic fluid; skin peeling off in thick sheets on sole of foot.  Unable to palpate pulses on (R) foot.  Previous amputation of toes noted on (R) foot.    Assisted caregiver today with dressing change of (R) foot wound; caregiver demonstrates very good technique with dressing changes  Neurological: Positive for weakness. Negative for dizziness.  Psychiatric/Behavioral: Negative for depression. The patient is not nervous/anxious.     Physical Exam  Constitutional: She is oriented to person, place, and time. She appears well-developed and well-nourished. No distress.    Cardiovascular: Regular rhythm  and normal heart sounds.  Bradycardia; radial pulses diminished bilaterally; unable to palpate (R) pedal pulse  Respiratory: Effort normal and breath sounds normal. No respiratory distress. She has no wheezes. She has no rales.  Coarse breath sounds Unable to obtain SaO2 measurement; patient reports "happens a lot," states related to "cold hands and feet"  GI: Soft. Bowel sounds are normal.  Musculoskeletal: She exhibits no edema.  Neurological: She is alert and oriented to person, place, and time.  Skin: Skin is warm and dry. No erythema.  See ROS; bilateral LE exhibit thick coarse skin with little/ no elasticity of skin on lower leg  Psychiatric: She has a normal mood and affect. Her behavior is normal. Thought content normal.   Encounter Medications:   Outpatient Encounter Medications as of 01/23/2017  Medication Sig Note  . acetaminophen (TYLENOL) 500 MG tablet Take 500-1,000 mg by mouth every 6 (six) hours as needed (pain).    [name] albuterol (PROVENTIL HFA;VENTOLIN HFA) 108 (90 Base) MCG/ACT inhaler Inhale 1-2 puffs into the lungs every 6 (six) hours as needed for wheezing or shortness of breath. 01/23/2017: Used last yesterday before ER visit   . aspirin 81 MG tablet Take 1 tablet (81 mg total) by mouth daily. (Patient taking differently: Take 81 mg by mouth every Tuesday, Thursday, Saturday, and Sunday. ) 01/23/2017: Caregiver reports patient  takes QD  . calcium acetate (PHOSLO) 667 MG capsule Take 667-2,001 mg by mouth See admin instructions. Take 3 capsules (2001 mg) with each meal & 1-2 capsule [phone] mg) by mouth with snacks.   . Calcium Carbonate Antacid (ALKA-SELTZER ANTACID PO) Take 2 tablets by mouth daily as needed (for indigestion).    . dorzolamide-timolol (COSOPT) 22.3-6.8 MG/ML ophthalmic solution Place 1 drop into the left eye daily.    [name] lidocaine-prilocaine (EMLA) cream Apply 1 application topically as needed (topical anesthesia for hemodialysis if Gebauers and Lidocaine  injection are ineffective.).   [name] metoprolol tartrate (LOPRESSOR) 50 MG tablet Take 1 tablet (50 mg total) by mouth every Tuesday, Thursday, Saturday, and Sunday. (Patient taking differently: Take 50 mg by mouth every Tuesday, Thursday, Saturday, and Sunday. In the morning.)   . oxycodone (OXY-IR) 5 MG capsule Take 5 mg by mouth every 6 (six) hours as needed for pain.   . simvastatin (ZOCOR) 10 MG tablet Take 1 tablet (10 mg total) by mouth every evening. (Patient taking differently: Take 10 mg by mouth daily. )   . cefpodoxime (VANTIN) 200 MG tablet Take 2 tablets (400 mg total) by mouth 3 (three) times a week. (Patient not taking: Reported on 01/23/2017) 01/23/2017: Caregiver and patient reports patient has now completed doses   . glucose blood (ONE TOUCH ULTRA TEST) test strip 1 each by Other route daily. And lancets 1/day   . nitroGLYCERIN (NITROSTAT) 0.4 MG SL tablet Place 1 tablet (0.4 mg total) under the tongue every 5 (five) minutes as needed for chest pain. (Patient not taking: Reported on 01/23/2017) 01/23/2017: Patient and caregiver report will obtain refill from pharmacy   . ondansetron (ZOFRAN) 4 MG tablet Take 1 tablet (4 mg total) by mouth every 6 (six) hours as needed for nausea. (Patient not taking: Reported on 01/23/2017) 01/23/2017: Patient reports does not take this medication   Facility-Administered Encounter Medications as of 01/23/2017  Medication  . mitoMYcin (MUTAMYCIN) Injection Use in OR only (0.4 mg/ml)   Functional Status:   In your present state of health, do you have any difficulty performing the following activities: 01/23/2017 01/11/2017  Hearing? N Y  Comment per patient report today -  Vision? Y N  Comment patient reports sees eye doctor "regularly" for glaucoma -  Difficulty concentrating or making decisions? N N  Comment per patient report today -  Walking or climbing stairs? Y Y  Comment Unable to do, per patient and caregiver/ daughter report today -  Dressing or  bathing? Y N  Comment per patient report, requires assistance -  Doing errands, shopping? Tempie Donning  Comment per patient report, caregiver/daughter provides transportation -  Conservation officer, nature and eating ? Y -  Using the Toilet? Y -  In the past six months, have you accidently leaked urine? N -  Comment on HD -  Do you have problems with loss of bowel control? N -  Managing your Medications? Y -  Managing your Finances? Y -  Housekeeping or managing your Housekeeping? Y -  Some recent data might be hidden   Fall/Depression Screening:    Fall Risk  01/23/2017 02/09/2015  Falls in the past year? Yes Yes  Number falls in past yr: 2 or more 1  Comment Reports last fall, "sometime last summer" -  Injury with Fall? No No  Risk Factor Category  High Fall Risk -  Risk for fall due to : History of fall(s);Impaired balance/gait;Impaired mobility;Impaired vision;Other (Comment) -  Risk for fall due to: Comment (R) foor decubitus ulcer, at risk for future amputation -  Follow up Falls evaluation completed;Falls prevention discussed;Education provided -   [facility] 2/9 Scores 01/23/2017 01/11/2017 02/09/2015  PHQ - 2 Score 0 0 0   Plan:  Patient will take medications as prescribed and will attend all provider appointments  Patient will continue using assistive devices/ wheelchair for mobility/ ambulation  I will place [place] CSW referral for evaluation of care assistance/ level of care needs  Patient's caregiver will communicate with [facility] CSW regarding patient's daily care/ level of care needs  I will make share today's [place] initial home visit notes and care plan with patient's PCP  [facility] CM outreach to continue with scheduled phone call next week, post arteriogram 01/25/17  [facility] CM Care Plan Problem One     Most Recent Value  Care Plan Problem One  Care needs at home for DME, caregiver support in HD patient with multiple recent hospitalizations and ED visits, as evidenced by  patient and caregiver reporting of same  Role Documenting the Problem One  Care Management Coordinator  Care Plan for Problem One  Active  [facility]N Long Term Goal   Over the next 60 days, patient/ caregiver will verbalize plan to meet patient care needs at home in patient requiring HD three times per week, as evidenced by patient and caregiver reporting  THN Long Term Goal Start Date  01/18/17  Interventions for Problem One Long Term Goal  Discussed with patient's caregiver current DME and care support needs,  discussed caregiver daily responsibilities in caring for patient,  discussed insurance benefits related to DME coverage.  Placed [facility] CSW referral 01/24/17 to assist with evaluation of assistance needed in home to care for patient,  Discussed with caregiver possibility of patient having heigher level of care needs than can be met at home,  [place] initial home visit completed  [facility] CM Short Term Goal #1   Over the next 7 days, patient's caregiver will verbalize plan to ensure patient's lift chair is delivered by home health DME agency, as evidenced by caregiver reporting of same during [facility] RN CCM outreach  [facility] CM Short Term Goal #1 Start Date  01/18/17  [facility] CM Short Term Goal #1 Met Date  01/23/17  Interventions for Short Term Goal #1  Discussed with caregiver patient's recent order for lift chair, along with caregiver's report that she is unable to afford the out-of pocket cost the insurance provider requires to have this delivered,  discussed possibility of placing [facility] CSW referral for evaluation of caregiver assistance and placed CSW referral on 01/24/17    [facility] CM Care Plan Problem Two     Most Recent Value  Care Plan Problem Two  Need for patient clarification of Advanced Directive wishes, as evidenced by patient and caregiver reporting  Role Documenting the Problem Two  Care Management Coordinator  Care Plan for Problem Two  Active  Interventions for Problem Two Long Term Goal   Using teachback  method, discussed with patient and caregiver patient's current understanding of and wishes for Advanced Directive planning,  provided AD planning packet and book, "Hard choices for loving people," to daughter and encouraged her to review material, write down her personal questions, and if possible, begin initiating conversation with patient, based on conversation started today with patient and [facility] RN CCM  [facility]N Long Term Goal  Over the next 45 days, patient will discuss her wishes  around [place] with [place] and with her daughter/ caregiver, as evidenced by successful completion of discussion  THN Long Term Goal Start Date  01/23/17  [facility] CM Short Term Goal #1   Over the next 30 days, patient's caregiver will review AD planning packet and book Hard Choices for [employer] and will write down her questions for further discussion, as evidenced by caregiver reporting during [facility] RN CCm outreach  [facility] CM Short Term Goal #1 Start Date  01/23/17  Interventions for Short Term Goal #2   Using teachback method, provided and discussed AD planning packet and book hard Choices for Loving People to caregiver and encouraged her to begin reviewing these for further discussion during [place] outreach    [facility] CM Care Plan Problem Three     Most Recent Value  Care Plan Problem Three  Risk for unplanned hosital admission related to presence of multiple chronic conditions, presence of progressing decubitus ulceration on (R) foot, overall level of care needs, as evidenced by patient and caregiver reporting   Role Documenting the Problem Three  Care Management [place] for Problem Three  Active  [facility] CM Short Term Goal #1   Over the next 30 days, patient will attend all scheduled provider appointments, as evidenced by patient/ caregiver reporting during [place] outreach  [facility] CM Short Term Goal #1 Start Date  01/23/17  Interventions for Short Term Goal #1  Discussed and reviewed all upcoming  provider appointments with caregiver and patient,  discussed transportation options available to patient and challenges around caregiver solely providing total care for patient  [facility] CM Short Term Goal #2   Over the next 30 days, patient and caregiver will discuss patient's care needs at home with [facility] CSW, as evidenced by successful completion of [facility] CSW outreach with patient/ caregiver  [facility] CM Short Term Goal #2 Start Date  01/23/17  Interventions for Short Term Goal #2  Using teachback method, discussed with patient and caregiver [facility] CSW role,  discussed patient care needs and lack of support currently in place for caregiver,  [facility] CSW referral placed on 01/24/17     I appreciate the opportunity to participate in Annajulia's care,  Oneta Rack, RN, BSN, [employer] Coordinator [facility] Care Management  [phone]

## 2017-01-23 NOTE — Patient Outreach (Signed)
[facility]) Care Management [place] Telephone Outreach, Care Coordination   01/23/2017  MAEGEN WIGLE 1945/12/06 [id number]  08:50 am:  Unsuccessful telephone outreach to [name], daughter/ caregiver of [name], 72 y/o female referred to [place] by [facility] hospital Liaison RN CM after recent hospitalization January 2-5, 2019 for nausea, vomiting, flank pain; patient was diagnosed with sepsis/ pyelonephritis during hospitalization.  Patient was discharged home to self-care with home health services in place through [place] for PT/ OT.  Patient has history including, but not limited to, colon cancer with exisiting colostomy, HTN/ HLD, CKD- on HD 3 x week, previous CVA, CAD, combined CHF, Diabetes Type Ii with PVD and transmetatarsal amputation, and GERD.  Patient had previously requested that I contact Tonya in [place] follow up.  Call was placed today to inquire about patient's ED visit yesterday, where it was recommended that patient re-scheduled yesterday's missed HD appointment for today; call was to clarify whether or not patient would be at home for previously scheduled [facility] RN CCM initial home visit.  HIPAA compliant voice mail message left for patient's caregivert, requesting return call back.  09:15 am: completed second outreach attempt by phone to patient's listed home number and successfully connected with patient's daughter/ caregiver; [name] confirmed that patient would not be attending HD today, as HD facility did not have an open appointment available for her; reported that patient would be attending HD as scheduled tomorrow.  [name] confirmed that she and patient would be present in their home today for previously scheduled [facility] RN CCM initial home visit.  Plan:  [place] initial home visit later today  Oneta Rack, RN, BSN, [place] Coordinator [facility] Care Management  [phone]

## 2017-01-24 ENCOUNTER — Encounter: Payer: Self-pay | Admitting: *Deleted

## 2017-01-24 ENCOUNTER — Other Ambulatory Visit: Payer: Self-pay | Admitting: Licensed Clinical Social Worker

## 2017-01-24 DIAGNOSIS — D631 Anemia in chronic kidney disease: Secondary | ICD-10-CM | POA: Diagnosis not present

## 2017-01-24 DIAGNOSIS — N186 End stage renal disease: Secondary | ICD-10-CM | POA: Diagnosis not present

## 2017-01-24 DIAGNOSIS — N2581 Secondary hyperparathyroidism of renal origin: Secondary | ICD-10-CM | POA: Diagnosis not present

## 2017-01-24 DIAGNOSIS — D509 Iron deficiency anemia, unspecified: Secondary | ICD-10-CM | POA: Diagnosis not present

## 2017-01-24 NOTE — Patient Outreach (Signed)
[facility]) Care Management  01/24/2017  LEVONIA WOLFLEY [birth year] [id number]  Assessment-CSW completed outreach attempt today after receiving new referral for social work assistance. CSW unable to reach patient successfully or leave a voice message as phone line continuously rang. CSW completed call to patient's daughter/caregiver but was unable to reach her as well. HIPPA compliant voice message left encouraging a return call once available.   Plan-CSW will await return call or complete an additional outreach if needed.  Eula Fried, BSW, MSW, [email or web address] Phone: [phone] Fax: [phone]

## 2017-01-25 ENCOUNTER — Telehealth: Payer: Self-pay | Admitting: Internal Medicine

## 2017-01-25 ENCOUNTER — Encounter (HOSPITAL_COMMUNITY)
Admission: RE | Disposition: A | Discharge status: Home / Self Care | Payer: Self-pay | Source: Ambulatory Visit | Attending: Vascular Surgery

## 2017-01-25 ENCOUNTER — Ambulatory Visit (HOSPITAL_COMMUNITY)
Admission: RE | Admit: 2017-01-25 | Discharge: 2017-01-25 | Disposition: A | Discharge status: Home / Self Care | Payer: Medicare Other | Source: Ambulatory Visit | Attending: Vascular Surgery | Admitting: Vascular Surgery

## 2017-01-25 ENCOUNTER — Telehealth: Payer: Self-pay | Admitting: Vascular Surgery

## 2017-01-25 DIAGNOSIS — I080 Rheumatic disorders of both mitral and aortic valves: Secondary | ICD-10-CM | POA: Insufficient documentation

## 2017-01-25 DIAGNOSIS — Z515 Encounter for palliative care: Secondary | ICD-10-CM | POA: Diagnosis not present

## 2017-01-25 DIAGNOSIS — Z933 Colostomy status: Secondary | ICD-10-CM | POA: Insufficient documentation

## 2017-01-25 DIAGNOSIS — I471 Supraventricular tachycardia: Secondary | ICD-10-CM

## 2017-01-25 DIAGNOSIS — Z992 Dependence on renal dialysis: Secondary | ICD-10-CM

## 2017-01-25 DIAGNOSIS — Z7982 Long term (current) use of aspirin: Secondary | ICD-10-CM

## 2017-01-25 DIAGNOSIS — H409 Unspecified glaucoma: Secondary | ICD-10-CM | POA: Diagnosis present

## 2017-01-25 DIAGNOSIS — Z8673 Personal history of transient ischemic attack (TIA), and cerebral infarction without residual deficits: Secondary | ICD-10-CM | POA: Insufficient documentation

## 2017-01-25 DIAGNOSIS — Z794 Long term (current) use of insulin: Secondary | ICD-10-CM

## 2017-01-25 DIAGNOSIS — Z6833 Body mass index (BMI) 33.0-33.9, adult: Secondary | ICD-10-CM

## 2017-01-25 DIAGNOSIS — E669 Obesity, unspecified: Secondary | ICD-10-CM

## 2017-01-25 DIAGNOSIS — Z6831 Body mass index (BMI) 31.0-31.9, adult: Secondary | ICD-10-CM

## 2017-01-25 DIAGNOSIS — R5383 Other fatigue: Secondary | ICD-10-CM | POA: Diagnosis not present

## 2017-01-25 DIAGNOSIS — I447 Left bundle-branch block, unspecified: Secondary | ICD-10-CM

## 2017-01-25 DIAGNOSIS — I252 Old myocardial infarction: Secondary | ICD-10-CM

## 2017-01-25 DIAGNOSIS — R627 Adult failure to thrive: Secondary | ICD-10-CM | POA: Diagnosis present

## 2017-01-25 DIAGNOSIS — R0602 Shortness of breath: Secondary | ICD-10-CM | POA: Diagnosis not present

## 2017-01-25 DIAGNOSIS — E11622 Type 2 diabetes mellitus with other skin ulcer: Secondary | ICD-10-CM | POA: Diagnosis present

## 2017-01-25 DIAGNOSIS — E11621 Type 2 diabetes mellitus with foot ulcer: Secondary | ICD-10-CM

## 2017-01-25 DIAGNOSIS — L03115 Cellulitis of right lower limb: Secondary | ICD-10-CM | POA: Diagnosis not present

## 2017-01-25 DIAGNOSIS — E872 Acidosis: Secondary | ICD-10-CM | POA: Diagnosis present

## 2017-01-25 DIAGNOSIS — K219 Gastro-esophageal reflux disease without esophagitis: Secondary | ICD-10-CM

## 2017-01-25 DIAGNOSIS — I5032 Chronic diastolic (congestive) heart failure: Secondary | ICD-10-CM

## 2017-01-25 DIAGNOSIS — R1110 Vomiting, unspecified: Secondary | ICD-10-CM | POA: Diagnosis not present

## 2017-01-25 DIAGNOSIS — E785 Hyperlipidemia, unspecified: Secondary | ICD-10-CM | POA: Insufficient documentation

## 2017-01-25 DIAGNOSIS — J398 Other specified diseases of upper respiratory tract: Secondary | ICD-10-CM | POA: Diagnosis present

## 2017-01-25 DIAGNOSIS — Z91041 Radiographic dye allergy status: Secondary | ICD-10-CM

## 2017-01-25 DIAGNOSIS — I132 Hypertensive heart and chronic kidney disease with heart failure and with stage 5 chronic kidney disease, or end stage renal disease: Secondary | ICD-10-CM | POA: Diagnosis present

## 2017-01-25 DIAGNOSIS — Z66 Do not resuscitate: Secondary | ICD-10-CM | POA: Diagnosis present

## 2017-01-25 DIAGNOSIS — I5022 Chronic systolic (congestive) heart failure: Secondary | ICD-10-CM | POA: Diagnosis present

## 2017-01-25 DIAGNOSIS — I70235 Atherosclerosis of native arteries of right leg with ulceration of other part of foot: Secondary | ICD-10-CM | POA: Insufficient documentation

## 2017-01-25 DIAGNOSIS — J9601 Acute respiratory failure with hypoxia: Secondary | ICD-10-CM | POA: Diagnosis present

## 2017-01-25 DIAGNOSIS — E1151 Type 2 diabetes mellitus with diabetic peripheral angiopathy without gangrene: Secondary | ICD-10-CM | POA: Insufficient documentation

## 2017-01-25 DIAGNOSIS — Z955 Presence of coronary angioplasty implant and graft: Secondary | ICD-10-CM

## 2017-01-25 DIAGNOSIS — Z8674 Personal history of sudden cardiac arrest: Secondary | ICD-10-CM

## 2017-01-25 DIAGNOSIS — E1122 Type 2 diabetes mellitus with diabetic chronic kidney disease: Secondary | ICD-10-CM | POA: Insufficient documentation

## 2017-01-25 DIAGNOSIS — Z85038 Personal history of other malignant neoplasm of large intestine: Secondary | ICD-10-CM | POA: Insufficient documentation

## 2017-01-25 DIAGNOSIS — I248 Other forms of acute ischemic heart disease: Secondary | ICD-10-CM | POA: Diagnosis present

## 2017-01-25 DIAGNOSIS — L97519 Non-pressure chronic ulcer of other part of right foot with unspecified severity: Secondary | ICD-10-CM | POA: Diagnosis present

## 2017-01-25 DIAGNOSIS — N186 End stage renal disease: Secondary | ICD-10-CM | POA: Diagnosis not present

## 2017-01-25 DIAGNOSIS — I255 Ischemic cardiomyopathy: Secondary | ICD-10-CM | POA: Diagnosis present

## 2017-01-25 DIAGNOSIS — D631 Anemia in chronic kidney disease: Secondary | ICD-10-CM | POA: Diagnosis present

## 2017-01-25 DIAGNOSIS — Z888 Allergy status to other drugs, medicaments and biological substances status: Secondary | ICD-10-CM | POA: Insufficient documentation

## 2017-01-25 DIAGNOSIS — I428 Other cardiomyopathies: Secondary | ICD-10-CM | POA: Insufficient documentation

## 2017-01-25 DIAGNOSIS — I2510 Atherosclerotic heart disease of native coronary artery without angina pectoris: Secondary | ICD-10-CM | POA: Insufficient documentation

## 2017-01-25 DIAGNOSIS — A419 Sepsis, unspecified organism: Principal | ICD-10-CM | POA: Diagnosis present

## 2017-01-25 DIAGNOSIS — N2581 Secondary hyperparathyroidism of renal origin: Secondary | ICD-10-CM | POA: Diagnosis present

## 2017-01-25 DIAGNOSIS — R092 Respiratory arrest: Secondary | ICD-10-CM | POA: Diagnosis not present

## 2017-01-25 DIAGNOSIS — L98499 Non-pressure chronic ulcer of skin of other sites with unspecified severity: Secondary | ICD-10-CM

## 2017-01-25 DIAGNOSIS — E1165 Type 2 diabetes mellitus with hyperglycemia: Secondary | ICD-10-CM | POA: Diagnosis not present

## 2017-01-25 DIAGNOSIS — L97512 Non-pressure chronic ulcer of other part of right foot with fat layer exposed: Secondary | ICD-10-CM

## 2017-01-25 DIAGNOSIS — Z79899 Other long term (current) drug therapy: Secondary | ICD-10-CM

## 2017-01-25 DIAGNOSIS — Z91012 Allergy to eggs: Secondary | ICD-10-CM

## 2017-01-25 HISTORY — PX: ABDOMINAL AORTOGRAM W/LOWER EXTREMITY: CATH118223

## 2017-01-25 LAB — GLUCOSE, CAPILLARY: Glucose-Capillary: 103 mg/dL — ABNORMAL HIGH (ref 65–99)

## 2017-01-25 LAB — POCT I-STAT, CHEM 8
BUN: 22 mg/dL — AB (ref 6–20)
Calcium, Ion: 0.96 mmol/L — ABNORMAL LOW (ref 1.15–1.40)
Chloride: 97 mmol/L — ABNORMAL LOW (ref 101–111)
Creatinine, Ser: 4.1 mg/dL — ABNORMAL HIGH (ref 0.44–1.00)
Glucose, Bld: 97 mg/dL (ref 65–99)
HEMATOCRIT: 38 % (ref 36.0–46.0)
HEMOGLOBIN: 12.9 g/dL (ref 12.0–15.0)
Potassium: 3.9 mmol/L (ref 3.5–5.1)
SODIUM: 139 mmol/L (ref 135–145)
TCO2: 29 mmol/L (ref 22–32)

## 2017-01-25 SURGERY — ABDOMINAL AORTOGRAM W/LOWER EXTREMITY
Anesthesia: LOCAL

## 2017-01-25 MED ORDER — HEPARIN (PORCINE) IN NACL 2-0.9 UNIT/ML-% IJ SOLN
INTRAMUSCULAR | Status: AC | PRN
  Administered 2017-01-25: 1000 mL via INTRA_ARTERIAL

## 2017-01-25 MED ORDER — LIDOCAINE HCL (PF) 1 % IJ SOLN
INTRAMUSCULAR | Status: AC
  Filled 2017-01-25: qty 30

## 2017-01-25 MED ORDER — METHYLPREDNISOLONE SODIUM SUCC 125 MG IJ SOLR
125.0000 mg | Freq: Once | INTRAMUSCULAR | Status: AC
  Administered 2017-01-25: 125 mg via INTRAVENOUS

## 2017-01-25 MED ORDER — DIPHENHYDRAMINE HCL 50 MG/ML IJ SOLN
25.0000 mg | Freq: Once | INTRAMUSCULAR | Status: AC
  Administered 2017-01-25: 25 mg via INTRAVENOUS

## 2017-01-25 MED ORDER — SODIUM CHLORIDE 0.9% FLUSH: 3.0000 mL | INTRAVENOUS | Status: DC | PRN

## 2017-01-25 MED ORDER — FAMOTIDINE IN NACL 20-0.9 MG/50ML-% IV SOLN
INTRAVENOUS | Status: AC
Start: 2017-01-25 — End: 2017-01-25
  Administered 2017-01-25: 20 mg
  Filled 2017-01-25: qty 50

## 2017-01-25 MED ORDER — HEPARIN (PORCINE) IN NACL 2-0.9 UNIT/ML-% IJ SOLN
INTRAMUSCULAR | Status: AC
  Filled 2017-01-25: qty 1000

## 2017-01-25 MED ORDER — DIPHENHYDRAMINE HCL 50 MG/ML IJ SOLN
INTRAMUSCULAR | Status: AC
  Administered 2017-01-25: 25 mg via INTRAVENOUS
  Filled 2017-01-25: qty 1

## 2017-01-25 MED ORDER — MORPHINE SULFATE (PF) 10 MG/ML IV SOLN: 2.0000 mg | INTRAVENOUS | Status: DC | PRN

## 2017-01-25 MED ORDER — SODIUM CHLORIDE 0.9 % IV SOLN: 250.0000 mL | INTRAVENOUS | Status: DC | PRN

## 2017-01-25 MED ORDER — SODIUM CHLORIDE 0.9% FLUSH: 3.0000 mL | Freq: Two times a day (BID) | INTRAVENOUS | Status: DC

## 2017-01-25 MED ORDER — HYDRALAZINE HCL 20 MG/ML IJ SOLN: 5.0000 mg | INTRAMUSCULAR | Status: DC | PRN

## 2017-01-25 MED ORDER — OXYCODONE HCL 5 MG PO TABS: 5.0000 mg | ORAL_TABLET | ORAL | Status: DC | PRN

## 2017-01-25 MED ORDER — METHYLPREDNISOLONE SODIUM SUCC 125 MG IJ SOLR
INTRAMUSCULAR | Status: AC
  Administered 2017-01-25: 125 mg via INTRAVENOUS
  Filled 2017-01-25: qty 2

## 2017-01-25 MED ORDER — LIDOCAINE HCL (PF) 1 % IJ SOLN
INTRAMUSCULAR | Status: DC | PRN
  Administered 2017-01-25: 15 mL

## 2017-01-25 MED ORDER — OXYCODONE HCL 5 MG PO CAPS: 5.0000 mg | ORAL_CAPSULE | Freq: Four times a day (QID) | ORAL | 0 refills | Status: DC | PRN

## 2017-01-25 MED ORDER — IODIXANOL 320 MG/ML IV SOLN
INTRAVENOUS | Status: DC | PRN
  Administered 2017-01-25: 105 mL via INTRA_ARTERIAL

## 2017-01-25 MED ORDER — LABETALOL HCL 5 MG/ML IV SOLN: 10.0000 mg | INTRAVENOUS | Status: DC | PRN

## 2017-01-25 SURGICAL SUPPLY — 11 items
CATH OMNI FLUSH 5F 65CM (CATHETERS) ×2 IMPLANT
CATH SOFT-VU 4F 65 STRAIGHT (CATHETERS) ×1 IMPLANT
CATH SOFT-VU STRAIGHT 4F 65CM (CATHETERS) ×2
GUIDEWIRE ANGLED .035X150CM (WIRE) ×2 IMPLANT
KIT MICROINTRODUCER STIFF 5F (SHEATH) ×2 IMPLANT
KIT PV (KITS) ×2 IMPLANT
SHEATH PINNACLE 5F 10CM (SHEATH) ×2 IMPLANT
SYR MEDRAD MARK V 150ML (SYRINGE) ×2 IMPLANT
TRANSDUCER W/STOPCOCK (MISCELLANEOUS) ×2 IMPLANT
TRAY PV CATH (CUSTOM PROCEDURE TRAY) ×2 IMPLANT
WIRE BENTSON .035X145CM (WIRE) ×2 IMPLANT

## 2017-01-25 NOTE — Telephone Encounter (Signed)
Dr. Alain Marion, the pt is requesting refill on her Oxycodone. Needing script re-sent.Marland KitchenJohny Chess

## 2017-01-25 NOTE — Telephone Encounter (Signed)
Done. Thx.

## 2017-01-25 NOTE — Telephone Encounter (Signed)
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  right common femoral artery cannulation under ultrasound guidance 2.  Placement of catheter in aorta 3.  Aortogram 4.  Second order arterial selection 5.  left leg runoff via catheter 6.  right leg runoff via sheath  Follow-up: 2 weeks with Dr. Scot Dock  Studies for follow-up: L groin duplex (re: evaluate L groin mass for pseudoaneurysm)

## 2017-01-25 NOTE — H&P (Signed)
Brief History and Physical  History of Present Illness   [name] is a 72 y.o. female diabetic s/p prior R TMA who presents with chief complaint: right foot ucler.  Patient has had this ulcer since December.  Patient has had associated pain with manipulating the ulcer.  She denies any fever or chills.  She is unaware of any drainage from the right foot.  This patient has been been lost to follow up since 12/28/10.  Dr. Scot Dock completed her TMA in 10/07/10.  Past Medical History:  Diagnosis Date  . Acute respiratory failure with hypoxia ([place]) 10/23/2014  . Adenomatous duodenal polyp   . Anemia   . Arthritis    "hands" (11/17/2015)  . Atrial tachycardia ([place])    on amiod  . Cardiomyopathy- mixed   . CHF (congestive heart failure) ([place])   . Chronic diastolic heart failure ([place])   . Colon cancer ([place]) 1986  . Colostomy in place [facility])   . Coronary artery disease    Previously decreased EF; echo 113 normal LV function  . Dialysis patient ([place])   . ESRD (end stage renal disease) on dialysis [facility])    Dr Dunham/Dr. Lyda Kalata.  M, W, Fr; [place] (11/17/2015)  . GERD (gastroesophageal reflux disease)   . Glaucoma   . Heart murmur   . Hernia, incisional    abd  . Hyperlipidemia   . Hypertension   . Hypertensive heart disease    sees Dr. Alain Marion  . LBBB (left bundle branch block)   . Mitral valve insufficiency and aortic valve insufficiency   . Obesity   . Stroke [facility])    2011/12  . Type II diabetes mellitus ([place])     Past Surgical History:  Procedure Laterality Date  . ARTERIOVENOUS GRAFT PLACEMENT Left 2010   "forearm"  . BALLOON DILATION N/A 08/17/2016   Procedure: POSSIBLE BALLOON DILATION POSSIBLE MALONEY;  Surgeon: Gatha Mayer, MD;  Location: [facility];  Service: Endoscopy;  Laterality: N/A;  . CARDIAC CATHETERIZATION    . CARDIAC CATHETERIZATION N/A 10/27/2014   Procedure: Left Heart Cath and Coronary Angiography;  Surgeon: Leonie Man, MD;  Location:  [place] CV LAB;  Service: Cardiovascular;  Laterality: N/A;  . CARDIAC CATHETERIZATION N/A 10/28/2014   Procedure: Coronary Stent Intervention;  Surgeon: Peter M Martinique, MD;  Location: [place] CV LAB;  Service: Cardiovascular;  Laterality: N/A;  . CARDIAC CATHETERIZATION Bilateral 12/2008   R. heart cath showed elevated left and right heart filling pressures w/ pulmonary artery pressure elevated mildly out of proportion to the wedge. The left heart cath showed diffuse distal vessel disease as well as a 75% stenosis in the mid circumflex w/ a 90% stenosis of the ostial first obtuse marginal. These lesions were in close proximity. there was a 60-70%mild RCA stenosis.   [name] CATARACT EXTRACTION W/ INTRAOCULAR LENS  IMPLANT, BILATERAL Bilateral   . COLON SURGERY    . COLONOSCOPY    . COLOSTOMY  1986  . ELECTROCARDIOGRAM  04-27-06  . ELECTROPHYSIOLOGIC STUDY N/A 11/17/2015   Procedure: A-Tach Ablation;  Surgeon: Will Meredith Leeds, MD;  Location: [place] CV LAB;  Service: Cardiovascular;  Laterality: N/A;  . ESOPHAGOGASTRODUODENOSCOPY  03-18-04  . ESOPHAGOGASTRODUODENOSCOPY N/A 02/02/2013   Procedure: ESOPHAGOGASTRODUODENOSCOPY (EGD);  Surgeon: Irene Shipper, MD;  Location: [facility] ENDOSCOPY;  Service: Endoscopy;  Laterality: N/A;  . ESOPHAGOGASTRODUODENOSCOPY (EGD) WITH PROPOFOL N/A 08/17/2016   Procedure: ESOPHAGOGASTRODUODENOSCOPY (EGD) WITH PROPOFOL;  Surgeon: Gatha Mayer,  MD;  Location: [facility];  Service: Endoscopy;  Laterality: N/A;   NO XRAY NEEDED  . EYE SURGERY    . FOOT AMPUTATION THROUGH METATARSAL  10-07-10   Right foot transmetatarsal  . INSERTION OF AHMED VALVE Right 08/20/2013   Procedure: INSERTION OF AHMED VALVE WITH Mitomycin C application;  Surgeon: Marylynn Pearson, MD;  Location: [place];  Service: Ophthalmology;  Laterality: Right;  . INSERTION OF AHMED VALVE Left 07/22/2014   Procedure: INSERTION OF AHMED VALVE WITH [place];  Surgeon: Marylynn Pearson, MD;  Location: [place];   Service: Ophthalmology;  Laterality: Left;  . LEFT HEART CATH AND CORONARY ANGIOGRAPHY N/A 08/18/2016   Procedure: LEFT HEART CATH AND CORONARY ANGIOGRAPHY;  Surgeon: Martinique, Peter M, MD;  Location: [place] CV LAB;  Service: Cardiovascular;  Laterality: N/A;  . MITOMYCIN C APPLICATION Left 07/02/7626   Procedure: MITOMYCIN C APPLICATION;  Surgeon: Marylynn Pearson, MD;  Location: [place];  Service: Ophthalmology;  Laterality: Left;  . PARS PLANA VITRECTOMY  11/29/2011   Procedure: PARS PLANA VITRECTOMY WITH 23 GAUGE;  Surgeon: Adonis Brook, MD;  Location: [place];  Service: Ophthalmology;  Laterality: Right;  Right Eye 23 ga vitrectomy with membrane peel  . PARS PLANA VITRECTOMY Left 02/28/2012   Procedure: PARS PLANA VITRECTOMY WITH 23 GAUGE;  Surgeon: Adonis Brook, MD;  Location: [place];  Service: Ophthalmology;  Laterality: Left;  . REVISION UROSTOMY CUTANEOUS    . SUPRAVENTRICULAR TACHYCARDIA ABLATION  11/17/2015  . VAGINAL HYSTERECTOMY      Social History   Socioeconomic History  . Marital status: Widowed    Spouse name: Not on file  . Number of children: Not on file  . Years of education: Not on file  . Highest education level: Not on file  Social Needs  . Financial resource strain: Not on file  . Food insecurity - worry: Not on file  . Food insecurity - inability: Not on file  . Transportation needs - medical: Not on file  . Transportation needs - non-medical: Not on file  Occupational History  . Not on file  Tobacco Use  . Smoking status: Never Smoker  . Smokeless tobacco: Never Used  Substance and Sexual Activity  . Alcohol use: No  . Drug use: No  . Sexual activity: No  Other Topics Concern  . Not on file  Social History Narrative  . Not on file    Family History  Problem Relation Age of Onset  . Hypertension Mother   . Coronary artery disease Mother   . Hypertension Father   . Diabetes Father   . Cancer Sister        colon  . Hypertension Other     Current  Facility-Administered Medications  Medication Dose Route Frequency Provider Last Rate Last Dose  . 0.9 %  sodium chloride infusion  250 mL Intravenous PRN Conrad [place], MD      . sodium chloride flush (NS) 0.9 % injection 3 mL  3 mL Intravenous Q12H Conrad [place], MD      . sodium chloride flush (NS) 0.9 % injection 3 mL  3 mL Intravenous PRN Conrad , MD       Facility-Administered Medications Ordered in Other Encounters  Medication Dose Route Frequency Provider Last Rate Last Dose  . mitoMYcin (MUTAMYCIN) Injection Use in OR only (0.4 mg/ml)  0.5 mL Left Eye Once Marylynn Pearson, MD        Allergies  Allergen Reactions  .  Ace Inhibitors Cough  . Eggs Or Egg-Derived Products Nausea And Vomiting  . Enalapril Cough  . Lisinopril Cough  . Omnipaque [Iohexol] Hives    REVIEW OF SYSTEMS (negative unless checked):   Cardiac:  []  Chest pain or chest pressure? []  Shortness of breath upon activity? []  Shortness of breath when lying flat? []  Irregular heart rhythm?  Vascular:  []  Pain in calf, thigh, or hip brought on by walking? []  Pain in feet at night that wakes you up from your sleep? []  Blood clot in your veins? []  Leg swelling?  Pulmonary:  []  Oxygen at home? []  Productive cough? []  Wheezing?  Neurologic:  []  Sudden weakness in arms or legs? []  Sudden numbness in arms or legs? []  Sudden onset of difficult speaking or slurred speech? []  Temporary loss of vision in one eye? []  Problems with dizziness?  Gastrointestinal:  []  Blood in stool? []  Vomited blood?  Genitourinary:  []  Burning when urinating? []  Blood in urine? [x]   End stage renal disease-HD: M/W/F  Psychiatric:  []  Major depression  Hematologic:  []  Bleeding problems? []  Problems with blood clotting?  Dermatologic:  [x]  Rashes or ulcers?  Constitutional:  []  Fever or chills?  Ear/Nose/Throat:  []  Change in hearing? []  Nose bleeds? []  Sore throat?  Musculoskeletal:  []  Back pain? []   Joint pain? []  Muscle pain?   Physical Examination   Vitals:   01/25/17 0705  BP: 107/74  Pulse: 85  Temp: 97.6 F (36.4 C)  TempSrc: Oral  SpO2: 91%  Weight: 161 lb (73 kg)  Height: 5' (1.524 m)   Body mass index is 31.44 kg/m.  General Alert, O x 3, WD, NAD  Pulmonary Sym exp, good B air movt, CTA B  Cardiac RRR, Nl S1, S2, no Murmurs, No rubs, No S3,S4  Musculo- skeletal L UA AVF with large PSA, pulsatile character, +bruit and thrill; B feet appear ischemia with R foot bandaged with obvious ischemic ulcer on plantar surface  Vascular Faint upper extremity pulses, non-palpable pedal pulses, faint femoral pulses  Neurologic Pain and light touch intact in extremities, somewhat decreased in R foot,     Laboratory   CBC CBC Latest Ref Rng & Units 01/22/2017 01/13/2017 01/12/2017  WBC 4.0 - 10.5 K/uL 9.6 10.9(H) 10.5  Hemoglobin 12.0 - 15.0 g/dL 10.9(L) 11.7(L) 10.7(L)  Hematocrit 36.0 - 46.0 % 34.4(L) 37.6 34.4(L)  Platelets 150 - 400 K/uL 309 290 277    BMP BMP Latest Ref Rng & Units 01/22/2017 01/13/2017 01/12/2017  Glucose 65 - 99 mg/dL 205(H) 179(H) 129(H)  BUN 6 - 20 mg/dL 28(H) 21(H) 36(H)  Creatinine 0.44 - 1.00 mg/dL 5.10(H) 3.70(H) 5.58(H)  Sodium 135 - 145 mmol/L 136 134(L) 138  Potassium 3.5 - 5.1 mmol/L 4.0 3.9 3.3(L)  Chloride 101 - 111 mmol/L 95(L) 94(L) 98(L)  CO2 22 - 32 mmol/L 24 24 28   Calcium 8.9 - 10.3 mg/dL 8.6(L) 8.1(L) 8.4(L)    Coagulation Lab Results  Component Value Date   INR 1.23 01/07/2017   INR 1.13 08/18/2016   INR 1.19 10/24/2014   No results found for: PTT  Lipids    Component Value Date/Time   CHOL 117 08/18/2016 0639   TRIG 64 08/18/2016 0639   HDL 39 (L) 08/18/2016 0639   CHOLHDL 3.0 08/18/2016 0639   VLDL 13 08/18/2016 0639   LDLCALC 65 08/18/2016 0639     Medical Decision Making   CHARYL MINERVINI is a 72 y.o. female diabetic s/p prior  R TMA who presents with: R foot ulceration.   The patient is scheduled for:  aortogram, bilateral leg runoff, and possible R leg intervention I discussed with the patient the nature of angiographic procedures, especially the limited patencies of any endovascular intervention.  The patient is aware of that the risks of an angiographic procedure include but are not limited to: bleeding, infection, access site complications, renal failure, embolization, rupture of vessel, dissection, possible need for emergent surgical intervention, possible need for surgical procedures to treat the patient's pathology, and stroke and death.    The patient is aware of the risks and agrees to proceed.   Adele Barthel, MD, FACS [facility] [employer] Office: [phone] Pager: [phone]  01/25/2017, 7:22 AM

## 2017-01-25 NOTE — Op Note (Signed)
OPERATIVE NOTE   PROCEDURE: 1.  right common femoral artery cannulation under ultrasound guidance 2.  Placement of catheter in aorta 3.  Aortogram 4.  Second order arterial selection 5.  left leg runoff via catheter 6.  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  Slow blood flow throughout entire arterial system with heavy calcification throughout  Aorta: patent, calcific  Superior mesenteric artery: patent Celiac artery: patent   Right Left  CIA Patent, calcified Patent, calcified  EIA Patent, calcified Patent, calcified  IIA Patent proximally, occludes distally Patent proximally, occludes distally  CFA Patent, calcified Patent, calcified  SFA Patent, calcified Patent, calcified  PFA Patent Patent  Pop Patent, calcified Patent, calcified  Trif Occluded Occluded  AT Patent, calcified, dominant runoff to foot Patent, calcified, sub-total occlusion in proximal 1/3, dominant runoff to foot  Pero Occluded Occluded  PT Occluded Occluded  Foot No plantar flow evident Limited flow into left foot   SPECIMEN(S):  none  INDICATIONS:   [name] is a 72 y.o. female who presents with right foot ulceration since December.  The patient presents for: aortogram, bilateral leg runoff, and possible intervention.  I discussed with the patient the nature of angiographic procedures, especially the limited patencies of any endovascular intervention.  I discussed with the patient the nature of angiographic procedures, especially the limited patencies of any endovascular intervention.  The patient is aware of that the risks of an angiographic procedure include but are not limited to: bleeding, infection, access site complications, renal failure,  embolization, rupture of vessel, dissection, arteriovenous fistula, possible need for emergent surgical intervention, possible need for surgical procedures to treat the patient's pathology, anaphylactic reaction to contrast, and stroke and death.  The patient is aware of the risks and agrees to proceed.   DESCRIPTION: After full informed consent was obtained from the patient, the patient was brought back to the angiography suite.  The patient was placed supine upon the angiography table and connected to cardiopulmonary monitoring equipment.  The patient was then given conscious sedation, the amounts of which are documented in the patient's chart.  The patient was prepped and drape in the standard fashion for an angiographic procedure.  At this point, attention was turned to the left groin.  There appeared to be a large firm mass.  On Sonosite, there was a suggestion of a pseudoaneurysm.  I subsequently elected to cannulate the right common femoral artery.  Under ultrasound guidance, the subcutaneous tissue surrounding the right common femoral artery was anesthesized with 1% lidocaine with epinephrine.  The artery was then cannulated with a micropuncture needle.  The microwire would not advance past the calcific disease.  I had to pull the wire and the needle and hold pressure for 3 minutes.  I shifted cannulation site twice before I could get the microwire to advance.  The microwire was advanced into the iliac arterial system.  The needle was exchanged for a microsheath, which was loaded into the common femoral artery over the wire.  The microwire was exchanged for a Bentson wire which was advanced into the aorta.  The microsheath was then exchanged for a 5-Fr sheath which was loaded into the common femoral artery.  The Omniflush catheter was then loaded over the wire up to the  level of L1.  The catheter was connected to the power injector circuit.  After de-airring and de-clotting the circuit, a power  injector aortogram was completed.  The findings are listed above.  The Bentson wire was replaced in the catheter, and using the Bentson and Omniflushcatheter, the right common iliac artery was selected.  Neither the catheter nor the wire would advanced into the external iliac artery.  Due inability to advance, the wire was exchanged for a Glidewire, which was advanced into the external iliac artery.  Similarly, I exchanged the catheter for a straight catheter which was loaded into the left external iliac artery.  An automated left leg runoff was completed after de-airring and de-clotting the circuit.  The findings are as listed above.    The straight catheter was removed together from the sheath.  The right sheath was aspirated and no clot was present.  The sheath was connected to the power injector circuit.  An automated right leg runoff was completed after de-airring and de-clotting the circuit.  The findings are as listed above.  The sheath was aspirated.  No clots were present and the sheath was reloaded with heparinized saline.    Based on the images, this patient needs: palliative management of her right foot gangrene vs more proximal amputation.   COMPLICATIONS: none  CONDITION: stable   Adele Barthel, MD, [facility] [place] Office: [phone] Pager: [phone]  01/25/2017, 10:18 AM

## 2017-01-25 NOTE — Telephone Encounter (Signed)
Sched lab 02/05/17 at 11:00, sched MD 02/07/17 at 2:15. Pt's ph# has no vm, lm on daughter's # to inform them of appts.

## 2017-01-25 NOTE — Telephone Encounter (Signed)
Copied from [place] [id number]. Topic: Quick Communication - See Telephone Encounter >> Jan 25, 2017  9:36 AM Ether Griffins B wrote: CRM for notification. See Telephone encounter for:  Pt needing refill on oxycodone  01/25/17.

## 2017-01-25 NOTE — Addendum Note (Signed)
Addended by: Cassandria Anger on: 01/25/2017 06:00 PM   Modules accepted: Orders

## 2017-01-25 NOTE — Progress Notes (Signed)
Right femoral artery sheath removed at 10:15. Manual pressure held for 20 minutes by Dalbert Batman, RN and dressed with a pressure dressing before returning to short stay.

## 2017-01-25 NOTE — Telephone Encounter (Signed)
Tried calling pt back no answer x's 10 rings! Per chart Oxycodone was refilled on Tues (01/23/17) has been sent to [facility].Marland KitchenJohny Chess

## 2017-01-25 NOTE — Telephone Encounter (Signed)
Pts daughter calling and states that [facility] told her the medication was not there.

## 2017-01-25 NOTE — Telephone Encounter (Signed)
Copied from [place] [id number]. Topic: Quick Communication - See Telephone Encounter >> Jan 25, 2017  9:36 AM Ether Griffins B wrote: CRM for notification. See Telephone encounter for:  Pt needing refill on oxycodone  01/25/17. >> Jan 25, 2017  4:05 PM Burnis Medin, NT wrote: Patient called back and said she is at the pharmacy and they said that they still haven't received a refill approval for oxycodone (OXY-IR) 5 MG capsule. Pt uses [facility] on [address].

## 2017-01-25 NOTE — Discharge Instructions (Signed)

## 2017-01-26 ENCOUNTER — Encounter (HOSPITAL_COMMUNITY): Payer: Self-pay | Admitting: Vascular Surgery

## 2017-01-26 DIAGNOSIS — N2581 Secondary hyperparathyroidism of renal origin: Secondary | ICD-10-CM | POA: Diagnosis not present

## 2017-01-26 DIAGNOSIS — N186 End stage renal disease: Secondary | ICD-10-CM | POA: Diagnosis not present

## 2017-01-26 DIAGNOSIS — D509 Iron deficiency anemia, unspecified: Secondary | ICD-10-CM | POA: Diagnosis not present

## 2017-01-26 DIAGNOSIS — D631 Anemia in chronic kidney disease: Secondary | ICD-10-CM | POA: Diagnosis not present

## 2017-01-26 NOTE — Telephone Encounter (Signed)
This was done yesterday at 5:48pm

## 2017-01-26 NOTE — Telephone Encounter (Signed)
Left detailed message for pt that rx has been sent to [facility].

## 2017-01-27 ENCOUNTER — Inpatient Hospital Stay (HOSPITAL_COMMUNITY)
Admission: EM | Admit: 2017-01-27 | Discharge: 2017-01-31 | DRG: 871 | Disposition: A | Discharge status: Home Health | Payer: Medicare Other | Attending: Nephrology | Admitting: Nephrology

## 2017-01-27 ENCOUNTER — Observation Stay (HOSPITAL_COMMUNITY): Payer: Medicare Other

## 2017-01-27 ENCOUNTER — Other Ambulatory Visit: Payer: Self-pay

## 2017-01-27 ENCOUNTER — Emergency Department (HOSPITAL_COMMUNITY): Payer: Medicare Other

## 2017-01-27 ENCOUNTER — Encounter (HOSPITAL_COMMUNITY): Payer: Self-pay | Admitting: Emergency Medicine

## 2017-01-27 DIAGNOSIS — I5022 Chronic systolic (congestive) heart failure: Secondary | ICD-10-CM | POA: Diagnosis present

## 2017-01-27 DIAGNOSIS — J398 Other specified diseases of upper respiratory tract: Secondary | ICD-10-CM | POA: Diagnosis not present

## 2017-01-27 DIAGNOSIS — R110 Nausea: Secondary | ICD-10-CM | POA: Diagnosis not present

## 2017-01-27 DIAGNOSIS — L97512 Non-pressure chronic ulcer of other part of right foot with fat layer exposed: Secondary | ICD-10-CM

## 2017-01-27 DIAGNOSIS — Z66 Do not resuscitate: Secondary | ICD-10-CM

## 2017-01-27 DIAGNOSIS — R627 Adult failure to thrive: Secondary | ICD-10-CM

## 2017-01-27 DIAGNOSIS — N186 End stage renal disease: Secondary | ICD-10-CM

## 2017-01-27 DIAGNOSIS — I255 Ischemic cardiomyopathy: Secondary | ICD-10-CM | POA: Diagnosis not present

## 2017-01-27 DIAGNOSIS — Z515 Encounter for palliative care: Secondary | ICD-10-CM

## 2017-01-27 DIAGNOSIS — R7989 Other specified abnormal findings of blood chemistry: Secondary | ICD-10-CM | POA: Diagnosis not present

## 2017-01-27 DIAGNOSIS — E872 Acidosis, unspecified: Secondary | ICD-10-CM

## 2017-01-27 DIAGNOSIS — D649 Anemia, unspecified: Secondary | ICD-10-CM | POA: Diagnosis present

## 2017-01-27 DIAGNOSIS — R092 Respiratory arrest: Secondary | ICD-10-CM | POA: Diagnosis not present

## 2017-01-27 DIAGNOSIS — R5383 Other fatigue: Secondary | ICD-10-CM | POA: Diagnosis not present

## 2017-01-27 DIAGNOSIS — I959 Hypotension, unspecified: Secondary | ICD-10-CM | POA: Diagnosis present

## 2017-01-27 DIAGNOSIS — E119 Type 2 diabetes mellitus without complications: Secondary | ICD-10-CM

## 2017-01-27 DIAGNOSIS — Z992 Dependence on renal dialysis: Secondary | ICD-10-CM

## 2017-01-27 DIAGNOSIS — R1110 Vomiting, unspecified: Secondary | ICD-10-CM | POA: Diagnosis not present

## 2017-01-27 DIAGNOSIS — I700 Atherosclerosis of aorta: Secondary | ICD-10-CM | POA: Diagnosis not present

## 2017-01-27 DIAGNOSIS — R0902 Hypoxemia: Secondary | ICD-10-CM | POA: Diagnosis not present

## 2017-01-27 DIAGNOSIS — R0602 Shortness of breath: Secondary | ICD-10-CM | POA: Diagnosis not present

## 2017-01-27 DIAGNOSIS — I2510 Atherosclerotic heart disease of native coronary artery without angina pectoris: Secondary | ICD-10-CM | POA: Diagnosis present

## 2017-01-27 DIAGNOSIS — L97509 Non-pressure chronic ulcer of other part of unspecified foot with unspecified severity: Secondary | ICD-10-CM | POA: Diagnosis present

## 2017-01-27 DIAGNOSIS — E669 Obesity, unspecified: Secondary | ICD-10-CM | POA: Diagnosis present

## 2017-01-27 HISTORY — DX: Other specified diseases of upper respiratory tract: J39.8

## 2017-01-27 LAB — CBC WITH DIFFERENTIAL/PLATELET
BASOS ABS: 0 10*3/uL (ref 0.0–0.1)
BASOS PCT: 0 %
EOS ABS: 0 10*3/uL (ref 0.0–0.7)
EOS PCT: 0 %
HCT: 33.7 % — ABNORMAL LOW (ref 36.0–46.0)
Hemoglobin: 10.6 g/dL — ABNORMAL LOW (ref 12.0–15.0)
Lymphocytes Relative: 9 %
Lymphs Abs: 0.8 10*3/uL (ref 0.7–4.0)
MCH: 26.6 pg (ref 26.0–34.0)
MCHC: 31.5 g/dL (ref 30.0–36.0)
MCV: 84.5 fL (ref 78.0–100.0)
Monocytes Absolute: 0.4 10*3/uL (ref 0.1–1.0)
Monocytes Relative: 5 %
Neutro Abs: 7.6 10*3/uL (ref 1.7–7.7)
Neutrophils Relative %: 86 %
PLATELETS: 251 10*3/uL (ref 150–400)
RBC: 3.99 MIL/uL (ref 3.87–5.11)
RDW: 17.9 % — ABNORMAL HIGH (ref 11.5–15.5)
WBC: 8.8 10*3/uL (ref 4.0–10.5)

## 2017-01-27 LAB — I-STAT ARTERIAL BLOOD GAS, ED
ACID-BASE EXCESS: 4 mmol/L — AB (ref 0.0–2.0)
BICARBONATE: 28.9 mmol/L — AB (ref 20.0–28.0)
O2 Saturation: 100 %
PH ART: 7.434 (ref 7.350–7.450)
Patient temperature: 98
TCO2: 30 mmol/L (ref 22–32)
pCO2 arterial: 43.1 mmHg (ref 32.0–48.0)
pO2, Arterial: 313 mmHg — ABNORMAL HIGH (ref 83.0–108.0)

## 2017-01-27 LAB — COMPREHENSIVE METABOLIC PANEL
ALT: 10 U/L — AB (ref 14–54)
AST: 18 U/L (ref 15–41)
Albumin: 2.7 g/dL — ABNORMAL LOW (ref 3.5–5.0)
Alkaline Phosphatase: 141 U/L — ABNORMAL HIGH (ref 38–126)
Anion gap: 20 — ABNORMAL HIGH (ref 5–15)
BUN: 18 mg/dL (ref 6–20)
CO2: 23 mmol/L (ref 22–32)
CREATININE: 3.8 mg/dL — AB (ref 0.44–1.00)
Calcium: 8 mg/dL — ABNORMAL LOW (ref 8.9–10.3)
Chloride: 95 mmol/L — ABNORMAL LOW (ref 101–111)
GFR calc Af Amer: 13 mL/min — ABNORMAL LOW (ref >60)
GFR, EST NON AFRICAN AMERICAN: 11 mL/min — AB (ref >60)
Glucose, Bld: 185 mg/dL — ABNORMAL HIGH (ref 65–99)
Potassium: 3.9 mmol/L (ref 3.5–5.1)
Sodium: 138 mmol/L (ref 135–145)
TOTAL PROTEIN: 6.8 g/dL (ref 6.5–8.1)
Total Bilirubin: 0.9 mg/dL (ref 0.3–1.2)

## 2017-01-27 LAB — LACTIC ACID, PLASMA: LACTIC ACID, VENOUS: 4 mmol/L — AB (ref 0.5–1.9)

## 2017-01-27 LAB — TROPONIN I
Troponin I: 0.08 ng/mL (ref <0.03)
Troponin I: 0.13 ng/mL (ref <0.03)

## 2017-01-27 LAB — HEMOGLOBIN A1C
HEMOGLOBIN A1C: 8.4 % — AB (ref 4.8–5.6)
MEAN PLASMA GLUCOSE: 194.38 mg/dL

## 2017-01-27 LAB — CREATININE, SERUM
Creatinine, Ser: 3.53 mg/dL — ABNORMAL HIGH (ref 0.44–1.00)
GFR calc Af Amer: 14 mL/min — ABNORMAL LOW (ref >60)
GFR calc non Af Amer: 12 mL/min — ABNORMAL LOW (ref >60)

## 2017-01-27 LAB — CBG MONITORING, ED
GLUCOSE-CAPILLARY: 110 mg/dL — AB (ref 65–99)
GLUCOSE-CAPILLARY: 108 mg/dL — AB (ref 65–99)

## 2017-01-27 LAB — I-STAT CG4 LACTIC ACID, ED
LACTIC ACID, VENOUS: 6.06 mmol/L — AB (ref 0.5–1.9)
LACTIC ACID, VENOUS: 4.86 mmol/L — AB (ref 0.5–1.9)

## 2017-01-27 LAB — I-STAT TROPONIN, ED: Troponin i, poc: 0.01 ng/mL (ref 0.00–0.08)

## 2017-01-27 LAB — GLUCOSE, CAPILLARY: Glucose-Capillary: 230 mg/dL — ABNORMAL HIGH (ref 65–99)

## 2017-01-27 MED ORDER — HYDROCORTISONE NA SUCCINATE PF 250 MG IJ SOLR
200.0000 mg | Freq: Once | INTRAMUSCULAR | Status: AC
  Administered 2017-01-27: 200 mg via INTRAVENOUS
  Filled 2017-01-27: qty 200

## 2017-01-27 MED ORDER — SIMVASTATIN 20 MG PO TABS
10.0000 mg | ORAL_TABLET | Freq: Every day | ORAL | Status: DC
  Administered 2017-01-27 – 2017-01-30 (×4): 10 mg via ORAL
  Filled 2017-01-27 (×4): qty 1

## 2017-01-27 MED ORDER — IOPAMIDOL (ISOVUE-370) INJECTION 76%
INTRAVENOUS | Status: AC
  Administered 2017-01-27: 100 mL
  Filled 2017-01-27: qty 100

## 2017-01-27 MED ORDER — ASPIRIN EC 81 MG PO TBEC
81.0000 mg | DELAYED_RELEASE_TABLET | ORAL | Status: DC
  Administered 2017-01-27 – 2017-01-30 (×3): 81 mg via ORAL
  Filled 2017-01-27 (×4): qty 1

## 2017-01-27 MED ORDER — DORZOLAMIDE HCL-TIMOLOL MAL 2-0.5 % OP SOLN
1.0000 [drp] | Freq: Every day | OPHTHALMIC | Status: DC
  Administered 2017-01-28 – 2017-01-31 (×4): 1 [drp] via OPHTHALMIC
  Filled 2017-01-27 (×2): qty 10

## 2017-01-27 MED ORDER — PIPERACILLIN-TAZOBACTAM 3.375 G IVPB 30 MIN
3.3750 g | Freq: Once | INTRAVENOUS | Status: AC
  Administered 2017-01-27: 3.375 g via INTRAVENOUS
  Filled 2017-01-27: qty 50

## 2017-01-27 MED ORDER — DIPHENHYDRAMINE HCL 25 MG PO CAPS: 50.0000 mg | ORAL_CAPSULE | Freq: Once | ORAL | Status: DC

## 2017-01-27 MED ORDER — VANCOMYCIN HCL IN DEXTROSE 1-5 GM/200ML-% IV SOLN: 1000.0000 mg | Freq: Once | INTRAVENOUS | Status: DC

## 2017-01-27 MED ORDER — OXYCODONE HCL 5 MG PO TABS
5.0000 mg | ORAL_TABLET | Freq: Four times a day (QID) | ORAL | Status: DC | PRN
  Administered 2017-01-30: 5 mg via ORAL
  Filled 2017-01-27: qty 1

## 2017-01-27 MED ORDER — DIPHENHYDRAMINE HCL 25 MG PO CAPS
25.0000 mg | ORAL_CAPSULE | Freq: Once | ORAL | Status: AC
  Administered 2017-01-27: 25 mg via ORAL
  Filled 2017-01-27: qty 1

## 2017-01-27 MED ORDER — IOPAMIDOL (ISOVUE-370) INJECTION 76%
INTRAVENOUS | Status: AC
  Filled 2017-01-27: qty 100

## 2017-01-27 MED ORDER — VANCOMYCIN HCL 10 G IV SOLR
1750.0000 mg | Freq: Once | INTRAVENOUS | Status: AC
  Administered 2017-01-27: 1750 mg via INTRAVENOUS
  Filled 2017-01-27: qty 1750

## 2017-01-27 MED ORDER — HEPARIN SODIUM (PORCINE) 5000 UNIT/ML IJ SOLN
5000.0000 [IU] | Freq: Three times a day (TID) | INTRAMUSCULAR | Status: DC
  Administered 2017-01-27 – 2017-01-30 (×9): 5000 [IU] via SUBCUTANEOUS
  Filled 2017-01-27 (×8): qty 1

## 2017-01-27 MED ORDER — ONDANSETRON HCL 4 MG PO TABS: 4.0000 mg | ORAL_TABLET | Freq: Four times a day (QID) | ORAL | Status: DC | PRN

## 2017-01-27 MED ORDER — PIPERACILLIN-TAZOBACTAM 3.375 G IVPB
3.3750 g | Freq: Two times a day (BID) | INTRAVENOUS | Status: DC
  Administered 2017-01-28 – 2017-01-29 (×5): 3.375 g via INTRAVENOUS
  Filled 2017-01-27 (×6): qty 50

## 2017-01-27 MED ORDER — INSULIN ASPART 100 UNIT/ML ~~LOC~~ SOLN
0.0000 [IU] | Freq: Three times a day (TID) | SUBCUTANEOUS | Status: DC
  Administered 2017-01-28: 3 [IU] via SUBCUTANEOUS
  Administered 2017-01-28: 2 [IU] via SUBCUTANEOUS
  Administered 2017-01-28: 3 [IU] via SUBCUTANEOUS
  Administered 2017-01-29: 1 [IU] via SUBCUTANEOUS

## 2017-01-27 MED ORDER — CALCIUM ACETATE (PHOS BINDER) 667 MG PO CAPS
667.0000 mg | ORAL_CAPSULE | ORAL | Status: DC
  Administered 2017-01-27 – 2017-01-29 (×3): 667 mg via ORAL
  Filled 2017-01-27: qty 1

## 2017-01-27 MED ORDER — INSULIN ASPART 100 UNIT/ML ~~LOC~~ SOLN
0.0000 [IU] | Freq: Every day | SUBCUTANEOUS | Status: DC
  Administered 2017-01-28: 2 [IU] via SUBCUTANEOUS

## 2017-01-27 MED ORDER — ACETAMINOPHEN 325 MG PO TABS
650.0000 mg | ORAL_TABLET | Freq: Four times a day (QID) | ORAL | Status: DC | PRN
  Administered 2017-01-29: 650 mg via ORAL
  Filled 2017-01-27: qty 2

## 2017-01-27 MED ORDER — ONDANSETRON HCL 4 MG/2ML IJ SOLN
4.0000 mg | Freq: Four times a day (QID) | INTRAMUSCULAR | Status: DC | PRN
  Administered 2017-01-29 – 2017-01-30 (×3): 4 mg via INTRAVENOUS
  Filled 2017-01-27 (×2): qty 2

## 2017-01-27 MED ORDER — SODIUM CHLORIDE 0.9 % IV BOLUS (SEPSIS)
1000.0000 mL | Freq: Once | INTRAVENOUS | Status: AC
  Administered 2017-01-27: 1000 mL via INTRAVENOUS

## 2017-01-27 MED ORDER — ACETAMINOPHEN 650 MG RE SUPP: 650.0000 mg | Freq: Four times a day (QID) | RECTAL | Status: DC | PRN

## 2017-01-27 MED ORDER — ACETAMINOPHEN 500 MG PO TABS: 500.0000 mg | ORAL_TABLET | Freq: Four times a day (QID) | ORAL | Status: DC | PRN

## 2017-01-27 MED ORDER — SODIUM CHLORIDE 0.9 % IV BOLUS (SEPSIS)
250.0000 mL | Freq: Once | INTRAVENOUS | Status: AC
  Administered 2017-01-27: 250 mL via INTRAVENOUS

## 2017-01-27 MED ORDER — SODIUM CHLORIDE 0.9 % IV BOLUS (SEPSIS): 500.0000 mL | Freq: Once | INTRAVENOUS | Status: DC

## 2017-01-27 MED ORDER — ALBUTEROL SULFATE (2.5 MG/3ML) 0.083% IN NEBU: 2.5000 mg | INHALATION_SOLUTION | Freq: Four times a day (QID) | RESPIRATORY_TRACT | Status: DC | PRN

## 2017-01-27 MED ORDER — CALCIUM ACETATE (PHOS BINDER) 667 MG PO CAPS
2001.0000 mg | ORAL_CAPSULE | Freq: Three times a day (TID) | ORAL | Status: DC
  Administered 2017-01-28: 2001 mg via ORAL
  Filled 2017-01-27 (×6): qty 3

## 2017-01-27 MED ORDER — SENNOSIDES-DOCUSATE SODIUM 8.6-50 MG PO TABS: 1.0000 | ORAL_TABLET | Freq: Every evening | ORAL | Status: DC | PRN

## 2017-01-27 NOTE — ED Notes (Signed)
Daughter [name] [phone]

## 2017-01-27 NOTE — ED Notes (Signed)
ED Provider and RT at bedside. 

## 2017-01-27 NOTE — ED Triage Notes (Signed)
Pt arrivers from home via [facility] reporting n/v today, reports last dialyzed yesterday.  EMS reports initial BP 60/34, SP O2 69% on NRB.  Pt AOx4 throughout transport. On arrival pt moaning, states, "I just don't feel good."

## 2017-01-27 NOTE — ED Notes (Signed)
MD paged regarding Troponin of 0.08

## 2017-01-27 NOTE — ED Notes (Signed)
Lactic acid given to Dr. Mickie Bail

## 2017-01-27 NOTE — Progress Notes (Addendum)
ANTIBIOTIC CONSULT NOTE - INITIAL  Pharmacy Consult for vancomycin and zosyn  Indication: sepsis  Allergies  Allergen Reactions  . Ace Inhibitors Cough  . Eggs Or Egg-Derived Products Nausea And Vomiting  . Enalapril Cough  . Lisinopril Cough  . Omnipaque [Iohexol] Hives    Vital Signs: Temp: 98 F (36.7 C) (01/19 1200) Temp Source: Oral (01/19 1200) BP: 101/71 (01/19 1245) Pulse Rate: 74 (01/19 1200) Intake/Output from previous day: No intake/output data recorded. Intake/Output from this shift: Total I/O In: 120 [I.V.:120] Out: -   Labs: Recent Labs    01/25/17 0757 01/27/17 1233  WBC  --  8.8  HGB 12.9 10.6*  PLT  --  251  CREATININE 4.10*  --    Estimated Creatinine Clearance: 11.2 mL/min (A) (by C-G formula based on SCr of 4.1 mg/dL (H)). No results for input(s): VANCOTROUGH, VANCOPEAK, VANCORANDOM, GENTTROUGH, GENTPEAK, GENTRANDOM, TOBRATROUGH, TOBRAPEAK, TOBRARND, AMIKACINPEAK, AMIKACINTROU, AMIKACIN in the last 72 hours.   Microbiology: N/A at this time  Medical History: [name] is a 72 y.o. female presenting to the ED today via [facility] with nausea/vomiting and generally "not feeling well." Patient recently admitted for a right foot ulcer she had since December. She received oral antibiotics for this ulcer and has completed the course of prescribed antibiotics outpatient. I  personally spoke with her usual outpatient dialysis center today on the phone, [facility], and patient is not receiving any IV antibiotics at this time. She was last dialyzed yesterday on 01/26/2017 and tolerated a full session. She follows a MWF schedule for dialysis at her outpatient center.  Medications:  Zosyn 3.375g IV q12h--4 hour infusion 1/19>> Vancomycin 1750mg  IV load x 1   Assessment: Patient presents not felling well, recent history of foot ulcer. Code sepsis initiated. LA >6.0, WBC 8.8, currently afebrile, blood cultures ordered.   Plan:  Follow-up plan  for dialysis to enter additional doses of vancomycin Target pre-HD level 15-72mcg/mL  Target post-HD level 5-54mcg/mL Follow up culture results and clinical progress   Jalene Mullet, Pharm.D. PGY1 Pharmacy Resident 01/27/2017 1:48 PM Main Pharmacy: [phone]

## 2017-01-27 NOTE — ED Notes (Signed)
CBG 110. 

## 2017-01-27 NOTE — ED Notes (Signed)
[facility] contacted to activate Code Sepsis 

## 2017-01-27 NOTE — ED Provider Notes (Signed)
[facility] EMERGENCY DEPARTMENT Provider Note   CSN: [id number] Arrival date & time: 01/27/17  1140     History   Chief Complaint Chief Complaint  Patient presents with  . Hypotension  . Nausea    HPI [name] is a 72 y.o. female.  HPI  72 year old female with a history of CHF and end-stage renal disease on dialysis presents with vomiting and hypotension.  EMS called after an episode of vomiting and near syncope.  The patient states she vomited shortly after eating her food this morning.  She felt a little short of breath which is an on and off problem for her but now feels better on the oxygen.  She states she has an albuterol inhaler at home she sometimes uses.  She denies any headache, chest pain, fevers.  She states she vomited once and there was no blood.  She has a chronic wound to her right lower extremity and told she probably needs an amputation of that extremity.  Currently she feels well.  EMS reports an initial blood pressure of 60/30 and an oxygen saturation of 69%.  Initial sat was around 79% here in the ER.  Past Medical History:  Diagnosis Date  . Acute respiratory failure with hypoxia ([place]) 10/23/2014  . Adenomatous duodenal polyp   . Anemia   . Arthritis    "hands" (11/17/2015)  . Atrial tachycardia ([place])    on amiod  . Cardiomyopathy- mixed   . CHF (congestive heart failure) ([place])   . Chronic diastolic heart failure ([place])   . Colon cancer ([place]) 1986  . Colostomy in place [facility])   . Coronary artery disease    Previously decreased EF; echo 113 normal LV function  . Dialysis patient ([place])   . ESRD (end stage renal disease) on dialysis [facility])    Dr Dunham/Dr. Lyda Kalata.  M, W, Fr; [place] (11/17/2015)  . GERD (gastroesophageal reflux disease)   . Glaucoma   . Heart murmur   . Hernia, incisional    abd  . Hyperlipidemia   . Hypertension   . Hypertensive heart disease    sees Dr. Alain Marion  . LBBB (left bundle branch block)   .  Mitral valve insufficiency and aortic valve insufficiency   . Obesity   . Stroke [facility])    2011/12  . Type II diabetes mellitus [facility])     Patient Active Problem List   Diagnosis Date Noted  . Foot pain, right 01/16/2017  . Skin ulcer due to diabetes mellitus ([place]) 01/16/2017  . Pyelonephritis 01/16/2017  . Sepsis secondary to UTI ([place]) 01/10/2017  . Syncope 11/03/2016  . PEA (Pulseless electrical activity) ([place])   . Chronic systolic CHF (congestive heart failure) ([place])   . Postprocedural hypotension   . End-stage renal disease on hemodialysis ([place])   . Cardiac arrest ([place]) 08/17/2016  . Erosive esophagitis   . Esophageal dysphagia   . Hematemesis with nausea   . Gastritis and gastroduodenitis   . Hard of hearing 04/25/2016  . GERD (gastroesophageal reflux disease) 02/08/2016  . Loss of weight 01/18/2016  . Atrial tachycardia ([place]) 11/17/2015  . Diabetes ([place]) 05/13/2015  . Ischemic cardiomyopathy   . Non-ST elevated myocardial infarction (non-STEMI) ([place])   . Dyspnea on exertion 10/23/2014  . Elevated troponin 10/23/2014  . Congestive heart failure (CHF) ([place]) 10/23/2014  . GI bleed 02/02/2013  . Chest pain 02/01/2013  . 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 History of colon  cancer   . Obesity   . ESRD on dialysis ([place]) 05/28/2012  . Nausea 05/21/2011  . Anemia 05/21/2011  . Coronary artery disease   . Foot ulcer ([place]) 09/29/2010  . History of hemorrhagic cerebrovascular accident (CVA) with residual deficit 06/01/2010  . Acute on chronic systolic and diastolic heart failure, NYHA class 1 ([place]) 01/27/2009  . Atrial tachycardia    . LBBB (left bundle branch block) 04/10/2008  . Hyperlipidemia   . Hypertensive heart disease     Past Surgical History:  Procedure Laterality Date  . ABDOMINAL AORTOGRAM W/LOWER EXTREMITY N/A 01/25/2017   Procedure: ABDOMINAL AORTOGRAM W/LOWER EXTREMITY;  Surgeon: Conrad Hillsboro, MD;  Location: [facility];   Service: Cardiovascular;  Laterality: N/A;  . ARTERIOVENOUS GRAFT PLACEMENT Left 2010   "forearm"  . BALLOON DILATION N/A 08/17/2016   Procedure: POSSIBLE BALLOON DILATION POSSIBLE MALONEY;  Surgeon: Gatha Mayer, MD;  Location: [facility] ENDOS[facility];  Service: Endoscopy;  Laterality: N/A;  . CARDIAC CATHETERIZATION    . CARDIAC CATHETERIZATION N/A 10/27/2014   Procedure: Left Heart Cath and Coronary Angiography;  Surgeon: Leonie Man, MD;  Location: [facility];  Service: Cardiovascular;  Laterality: N/A;  . CARDIAC CATHETERIZATION N/A 10/28/2014   Procedure: Coronary Stent Intervention;  Surgeon: Peter M Martinique, MD;  Location: [facility];  Service: Cardiovascular;  Laterality: N/A;  . CARDIAC CATHETERIZATION Bilateral 12/2008   R. heart cath showed elevated left and right heart filling pressures w/ pulmonary artery pressure elevated mildly out of proportion to the wedge. The left heart cath showed diffuse distal vessel disease as well as a 75% stenosis in the mid circumflex w/ a 90% stenosis of the ostial first obtuse marginal. These lesions were in close proximity. there was a 60-70%mild RCA stenosis.   [name] CATARACT EXTRACTION W/ INTRAOCULAR LENS  IMPLANT, BILATERAL Bilateral   . COLON SURGERY    . COLONOSCOPY    . COLOSTOMY  1986  . ELECTROCARDIOGRAM  04-27-06  . ELECTROPHYSIOLOGIC STUDY N/A 11/17/2015   Procedure: A-Tach Ablation;  Surgeon: Will Meredith Leeds, MD;  Location: [facility];  Service: Cardiovascular;  Laterality: N/A;  . ESOPHAGOGASTRODUODENOSCOPY  03-18-04  . ESOPHAGOGASTRODUODENOSCOPY N/A 02/02/2013   Procedure: ESOPHAGOGASTRODUODENOSCOPY (EGD);  Surgeon: Irene Shipper, MD;  Location: [facility] ENDOSCOPY;  Service: Endoscopy;  Laterality: N/A;  . ESOPHAGOGASTRODUODENOSCOPY (EGD) WITH PROPOFOL N/A 08/17/2016   Procedure: ESOPHAGOGASTRODUODENOSCOPY (EGD) WITH PROPOFOL;  Surgeon: Gatha Mayer, MD;  [facility]: [facility];  Service: Endoscopy;  Laterality: N/A;   NO XRAY  NEEDED  . EYE SURGERY    . FOOT AMPUTATION THROUGH METATARSAL  10-07-10   Right foot transmetatarsal  . INSERTION OF AHMED VALVE Right 08/20/2013   Procedure: INSERTION OF AHMED VALVE WITH Mitomycin C application;  Surgeon: Marylynn Pearson, MD;  Location: [place];  Service: Ophthalmology;  Laterality: Right;  . INSERTION OF AHMED VALVE Left 07/22/2014   Procedure: INSERTION OF AHMED VALVE WITH [place];  Surgeon: Marylynn Pearson, MD;  Location: [facility];  Service: Ophthalmology;  Laterality: Left;  . LEFT HEART CATH AND CORONARY ANGIOGRAPHY N/A 08/18/2016   Procedure: LEFT HEART CATH AND CORONARY ANGIOGRAPHY;  Surgeon: Martinique, Peter M, MD;  Location: [facility];  Service: Cardiovascular;  Laterality: N/A;  . MITOMYCIN C APPLICATION Left 1/91/4782   Procedure: MITOMYCIN C APPLICATION;  Surgeon: Marylynn Pearson, MD;  Location: [facility];  Service: Ophthalmology;  Laterality: Left;  . PARS PLANA VITRECTOMY  11/29/2011  Procedure: PARS PLANA VITRECTOMY WITH 23 GAUGE;  Surgeon: Adonis Brook, MD;  Location: [employer];  Service: Ophthalmology;  Laterality: Right;  Right Eye 23 ga vitrectomy with membrane peel  . PARS PLANA VITRECTOMY Left 02/28/2012   Procedure: PARS PLANA VITRECTOMY WITH 23 GAUGE;  Surgeon: Adonis Brook, MD;  Location: [facility];  Service: Ophthalmology;  Laterality: Left;  . REVISION UROSTOMY CUTANEOUS    . SUPRAVENTRICULAR TACHYCARDIA ABLATION  11/17/2015  . VAGINAL HYSTERECTOMY      OB History    No data available       Home Medications    Prior to Admission medications   Medication Sig Start Date End Date Taking? Authorizing Provider  acetaminophen (TYLENOL) 500 MG tablet Take 500-1,000 mg by mouth every 6 (six) hours as needed (pain).     [provider]  albuterol (PROVENTIL HFA;VENTOLIN HFA) 108 (90 Base) MCG/ACT inhaler Inhale 1-2 puffs into the lungs every 6 (six) hours as needed for wheezing or shortness of breath.    [provider]  aspirin 81 MG tablet Take  1 tablet (81 mg total) by mouth daily. Patient taking differently: Take 81 mg by mouth every Tuesday, Thursday, Saturday, and Sunday.  10/29/14   Dhungel, Flonnie Overman, MD  calcium acetate (PHOSLO) 667 MG capsule Take 667-2,001 mg by mouth See admin instructions. Take 3 capsules (2001 mg) with each meal & 1-2 capsule ([phone] mg) by mouth with snacks.    [provider]  Calcium Carbonate Antacid (ALKA-SELTZER ANTACID PO) Take 2 tablets by mouth daily as needed (for indigestion).     [provider]  cefpodoxime (VANTIN) 200 MG tablet Take 2 tablets (400 mg total) by mouth 3 (three) times a week. Patient not taking: Reported on 01/23/2017 01/15/17   Edwin Dada, MD  dorzolamide-timolol (COSOPT) 22.3-6.8 MG/ML ophthalmic solution Place 1 drop into the left eye daily.  11/09/15   [provider]  glucose blood (ONE TOUCH ULTRA TEST) test strip 1 each by Other route daily. And lancets 1/day 03/16/16   Renato Shin, MD  lidocaine-prilocaine (EMLA) cream Apply 1 application topically as needed (topical anesthesia for hemodialysis if Gebauers and Lidocaine injection are ineffective.). 08/20/16   Allie Bossier, MD  metoprolol tartrate (LOPRESSOR) 50 MG tablet Take 1 tablet (50 mg total) by mouth every Tuesday, Thursday, Saturday, and Sunday. Patient taking differently: Take 50 mg by mouth every Tuesday, Thursday, Saturday, and Sunday. In the morning. 08/20/16   Allie Bossier, MD  nitroGLYCERIN (NITROSTAT) 0.4 MG SL tablet Place 1 tablet (0.4 mg total) under the tongue every 5 (five) minutes as needed for chest pain. Patient not taking: Reported on 01/23/2017 10/29/14   Dhungel, Flonnie Overman, MD  ondansetron (ZOFRAN) 4 MG tablet Take 1 tablet (4 mg total) by mouth every 6 (six) hours as needed for nausea. Patient not taking: Reported on 01/23/2017 01/13/17   Edwin Dada, MD  oxycodone (OXY-IR) 5 MG capsule Take 1 capsule (5 mg total) by mouth every 6 (six) hours as needed for  pain. 01/25/17   Plotnikov, Evie Lacks, MD  simvastatin (ZOCOR) 10 MG tablet Take 1 tablet (10 mg total) by mouth every evening. Patient taking differently: Take 10 mg by mouth daily.  05/23/16   Camnitz, Ocie Doyne, MD    Family History Family History  Problem Relation Age of Onset  . Hypertension Mother   . Coronary artery disease Mother   . Hypertension Father   . Diabetes Father   . Cancer  Sister        colon  . Hypertension Other     Social History Social History   Tobacco Use  . Smoking status: Never Smoker  . Smokeless tobacco: Never Used  Substance Use Topics  . Alcohol use: No  . Drug use: No     Allergies   Ace inhibitors; Eggs or egg-derived products; Enalapril; Lisinopril; and Omnipaque [iohexol]   Review of Systems Review of Systems  Constitutional: Positive for fatigue. Negative for fever.  Respiratory: Positive for shortness of breath. Negative for cough.   Cardiovascular: Negative for chest pain.  Gastrointestinal: Positive for vomiting. Negative for abdominal pain.  Musculoskeletal: Positive for arthralgias (left hip - chronic).  Skin: Positive for wound.  All other systems reviewed and are negative.    Physical Exam Updated Vital Signs BP 106/61   Pulse 73   Temp 98 F (36.7 C) (Oral)   Resp 16   SpO2 100%   Physical Exam  Constitutional: She is oriented to person, place, and time. She appears well-developed and well-nourished. No distress.  HENT:  Head: Normocephalic and atraumatic.  Right Ear: External ear normal.  Left Ear: External ear normal.  Nose: Nose normal.  Eyes: Right eye exhibits no discharge. Left eye exhibits no discharge.  Cardiovascular: Normal rate, regular rhythm and normal heart sounds.  Pulmonary/Chest: Effort normal and breath sounds normal. She has no wheezes. She has no rales.  Abdominal: Soft. She exhibits no distension. There is no tenderness.  Musculoskeletal:  Small superficial wound to medial right  foot Left AV fistula  Neurological: She is alert and oriented to person, place, and time.  Skin: Skin is warm and dry. She is not diaphoretic.  Nursing note and vitals reviewed.    ED Treatments / Results  Labs (all labs ordered are listed, but only abnormal results are displayed) Labs Reviewed  COMPREHENSIVE METABOLIC PANEL - Abnormal; Notable for the following components:      Result Value   Chloride 95 (*)    Glucose, Bld 185 (*)    Creatinine, Ser 3.80 (*)    Calcium 8.0 (*)    Albumin 2.7 (*)    ALT 10 (*)    Alkaline Phosphatase 141 (*)    GFR calc non Af Amer 11 (*)    GFR calc Af Amer 13 (*)    Anion gap 20 (*)    All other components within normal limits  CBC WITH DIFFERENTIAL/PLATELET - Abnormal; Notable for the following components:   Hemoglobin 10.6 (*)    HCT 33.7 (*)    RDW 17.9 (*)    All other components within normal limits  I-STAT CG4 LACTIC ACID, ED - Abnormal; Notable for the following components:   Lactic Acid, Venous 6.06 (*)    All other components within normal limits  I-STAT ARTERIAL BLOOD GAS, ED - Abnormal; Notable for the following components:   pO2, Arterial 313.0 (*)    Bicarbonate 28.9 (*)    Acid-Base Excess 4.0 (*)    All other components within normal limits  CULTURE, BLOOD (ROUTINE X 2)  CULTURE, BLOOD (ROUTINE X 2)  URINALYSIS, ROUTINE W REFLEX MICROSCOPIC  I-STAT TROPONIN, ED  I-STAT CG4 LACTIC ACID, ED    EKG  EKG Interpretation  Date/Time:  Saturday January 27 2017 11:55:33 EST Ventricular Rate:  74 PR Interval:    QRS Duration: 163 QT Interval:  527 QTC Calculation: 585 R Axis:   -155 Text Interpretation:  Sinus rhythm Prolonged PR  interval Nonspecific intraventricular conduction delay Consider anterior infarct no significant change since earlier in the month Confirmed by Sherwood Gambler [phone]) on 01/27/2017 12:38:30 PM       Radiology Dg Chest Port 1 View  Result Date: 01/27/2017 CLINICAL DATA:  Hypoxia. EXAM:  PORTABLE CHEST 1 VIEW COMPARISON:  01/22/2017 FINDINGS: The cardiac silhouette is enlarged, similar to the prior radiograph. Mediastinal contours appear intact. Calcific atherosclerotic disease of the aorta. Deviation of the proximal trachea to the right, with fullness in the left superior hemithorax. There is no evidence of focal airspace consolidation, pleural effusion or pneumothorax. Osseous structures are without acute abnormality. Soft tissues are grossly normal. IMPRESSION: Enlargement of the cardiac silhouette, which may be due to cardiomegaly or pericardial effusion. Calcific atherosclerotic disease of the aorta. Deviation of the proximal trachea to the right with apparent fullness in the left superior hemithorax. This may represent a thyroid or mediastinal mass displacing the trachea, or potentially subpleural pulmonary nodule. Further evaluation with contrast-enhanced chest CT may be considered. Electronically Signed   By: Fidela Salisbury M.D.   On: 01/27/2017 12:47    Procedures .Critical Care Performed by: Sherwood Gambler, MD Authorized by: Sherwood Gambler, MD   Critical care provider statement:    Critical care time (minutes):  40   Critical care time was exclusive of:  Separately billable procedures and treating other patients   Critical care was necessary to treat or prevent imminent or life-threatening deterioration of the following conditions:  Respiratory failure, sepsis and shock   Critical care was time spent personally by me on the following activities:  Development of treatment plan with patient or surrogate, discussions with consultants, evaluation of patient's response to treatment, examination of patient, obtaining history from patient or surrogate, ordering and performing treatments and interventions, ordering and review of laboratory studies, ordering and review of radiographic studies, pulse oximetry, re-evaluation of patient's condition and review of old charts    (including critical care time)  Medications Ordered in ED Medications  sodium chloride 0.9 % bolus 1,000 mL (1,000 mLs Intravenous New Bag/Given 01/27/17 1426)    And  sodium chloride 0.9 % bolus 1,000 mL (not administered)    And  sodium chloride 0.9 % bolus 250 mL (250 mLs Intravenous New Bag/Given 01/27/17 1444)  vancomycin (VANCOCIN) 1,750 mg in sodium chloride 0.9 % 500 mL IVPB (1,750 mg Intravenous New Bag/Given 01/27/17 1436)  piperacillin-tazobactam (ZOSYN) IVPB 3.375 g (not administered)  hydrocortisone sodium succinate (SOLU-CORTEF) injection 200 mg (not administered)  diphenhydrAMINE (BENADRYL) capsule 25 mg (not administered)  piperacillin-tazobactam (ZOSYN) IVPB 3.375 g (3.375 g Intravenous New Bag/Given 01/27/17 1429)     Initial Impression / Assessment and Plan / ED Course  I have reviewed the triage vital signs and the nursing notes.  Pertinent labs & imaging results that were available during my care of the patient were reviewed by me and considered in my medical decision making (see chart for details).     Patient is no longer hypotensive.  The oxygen is difficult to analyze as multiple different pulse oximeter is have a hard time picking up a waveform.  Every once a while will show 100% oxygen saturation but it is not consistent.  Unclear why this is occurring as she does not appear particularly cold and is not currently hypotensive.  She is awake, alert, and denies any dyspnea at this time.  ABG shows O2 sat of 100% and PaO2 of 313.  Thus she was weaned down on oxygen.  While no good waveforms were able to be obtained, her oxygen saturation continues to intermittently be 100% when reading.  Given she is not short of breath, we will continue to monitor.  Lactic acid of 6 is of unclear etiology.  She was given broad antibiotics.  Differential includes sepsis but also this could be related to the period of time when she had significant hypotension and hypoxemia.  While these have  been corrected, she will needs further supportive care.  Given the transit shortness of breath I will do a CT scan to rule out PE.  However she has to have a contrast allergy and so the desensitization protocol will be started.  Admit to the hospitalist.  Final Clinical Impressions(s) / ED Diagnoses   Final diagnoses:  Lactic acidosis    ED Discharge Orders    None       Sherwood Gambler, MD 01/27/17 [phone]

## 2017-01-27 NOTE — ED Notes (Signed)
Patient transported to CT 

## 2017-01-27 NOTE — H&P (Signed)
History and Physical    [name] XFG:[id number] DOB: 1945/02/13 DOA: 01/27/2017  PCP: Cassandria Anger, MD Patient coming from: home  Chief Complaint: hypotension/hypoxia  HPI: [name] is a 72 y.o. female with medical history significant for end-stage renal disease Monday Wednesday Friday dialysis, CHF, cardiomyopathy, stroke, CHF with an EF of 25%, diabetes, colon cancer status post colostomy, PA arrest during EGD in August 2018 recent hospitalization for suspected urosepsis, chronic right ulcer presents to the emergency department from home with chief complaint hypotension and hypoxia. Initial evaluation reveals systolic blood pressure of 110 oxygen saturation 74. [facility] were asked to admit  Information is obtained from the chart and the patient. 2 days ago she underwent angiographic of the leg without complication. This morning she states she was feeling fine she ate her breakfast suddenly became nauseated and vomited. She says she lives with her daughter and niece who called EMS as patient appeared to be somewhat unresponsive. She said right before this episode she felt "cold". EMS reports an initial blood pressure of 60/30 and oxygen saturation of 69%. She denies chest pain palpitation headache dizziness. She denies a worsening lower extremity edema or orthopnea. She denies abdominal pain diaphoresis diarrhea constipation melena. The emergency department oxygen saturation level 79% on room air she was placed on a nonrebreather  ED Course: In the emergency department she is provided with IV fluids broad-spectrum antibiotics for an elevated lactic acid hypotension. At the time of admission the pressure 106/61 oxygen saturation level 100% on room air  Review of Systems: As per HPI otherwise all other systems reviewed and are negative.   Ambulatory Status: Patient's nonambulatory refuses to ambulate  Past Medical History:  Diagnosis Date  . Acute respiratory  failure with hypoxia ([place]) 10/23/2014  . Adenomatous duodenal polyp   . Anemia   . Arthritis    "hands" (11/17/2015)  . Atrial tachycardia ([place])    on amiod  . Cardiomyopathy- mixed   . CHF (congestive heart failure) ([place])   . Chronic diastolic heart failure ([place])   . Colon cancer ([place]) 1986  . Colostomy in place [facility])   . Coronary artery disease    Previously decreased EF; echo 113 normal LV function  . Deviated trachea   . Dialysis patient ([place])   . ESRD (end stage renal disease) on dialysis [facility])    Dr Dunham/Dr. Lyda Kalata.  M, W, Fr; [place] (11/17/2015)  . GERD (gastroesophageal reflux disease)   . Glaucoma   . Heart murmur   . Hernia, incisional    abd  . Hyperlipidemia   . Hypertension   . Hypertensive heart disease    sees Dr. Alain Marion  . LBBB (left bundle branch block)   . Mitral valve insufficiency and aortic valve insufficiency   . Obesity   . Stroke [facility])    2011/12  . Type II diabetes mellitus ([place])     Past Surgical History:  Procedure Laterality Date  . ABDOMINAL AORTOGRAM W/LOWER EXTREMITY N/A 01/25/2017   Procedure: ABDOMINAL AORTOGRAM W/LOWER EXTREMITY;  Surgeon: Conrad Naper, MD;  Location: [place] [facility];  Service: Cardiovascular;  Laterality: N/A;  . ARTERIOVENOUS GRAFT PLACEMENT Left 2010   "forearm"  . BALLOON DILATION N/A 08/17/2016   Procedure: POSSIBLE BALLOON DILATION POSSIBLE MALONEY;  Surgeon: Gatha Mayer, MD;  Location: [facility];  Service: Endoscopy;  Laterality: N/A;  . CARDIAC CATHETERIZATION    . CARDIAC CATHETERIZATION N/A 10/27/2014   Procedure: Left Heart Cath  and Coronary Angiography;  Surgeon: Leonie Man, MD;  Location: [place] [facility];  Service: Cardiovascular;  Laterality: N/A;  . CARDIAC CATHETERIZATION N/A 10/28/2014   Procedure: Coronary Stent Intervention;  Surgeon: Peter M Martinique, MD;  Location: [place] [facility];  Service: Cardiovascular;  Laterality: N/A;  . CARDIAC CATHETERIZATION Bilateral 12/2008   R.  heart cath showed elevated left and right heart filling pressures w/ pulmonary artery pressure elevated mildly out of proportion to the wedge. The left heart cath showed diffuse distal vessel disease as well as a 75% stenosis in the mid circumflex w/ a 90% stenosis of the ostial first obtuse marginal. These lesions were in close proximity. there was a 60-70%mild RCA stenosis.   [name] CATARACT EXTRACTION W/ INTRAOCULAR LENS  IMPLANT, BILATERAL Bilateral   . COLON SURGERY    . COLONOSCOPY    . COLOSTOMY  1986  . ELECTROCARDIOGRAM  04-27-06  . ELECTROPHYSIOLOGIC STUDY N/A 11/17/2015   Procedure: A-Tach Ablation;  Surgeon: Will Meredith Leeds, MD;  Location: [place] [facility];  Service: Cardiovascular;  Laterality: N/A;  . ESOPHAGOGASTRODUODENOSCOPY  03-18-04  . ESOPHAGOGASTRODUODENOSCOPY N/A 02/02/2013   Procedure: ESOPHAGOGASTRODUODENOSCOPY (EGD);  Surgeon: Irene Shipper, MD;  Location: [facility] END[facility]COPY;  Service: Endoscopy;  Laterality: N/A;  . ESOPHAGOGASTRODUODENOSCOPY (EGD) WITH PROPOFOL N/A 08/17/2016   Procedure: ESOPHAGOGASTRODUODENOSCOPY (EGD) WITH PROPOFOL;  Surgeon: Gatha Mayer, MD;  [facility];  Service: Endoscopy;  Laterality: N/A;   NO XRAY NEEDED  . EYE SURGERY    . FOOT AMPUTATION THROUGH METATARSAL  10-07-10   Right foot transmetatarsal  . INSERTION OF AHMED VALVE Right 08/20/2013   Procedure: INSERTION OF AHMED VALVE WITH Mitomycin C application;  Surgeon: Marylynn Pearson, MD;  Location: [place];  Service: Ophthalmology;  Laterality: Right;  . INSERTION OF AHMED VALVE Left 07/22/2014   Procedure: INSERTION OF AHMED VALVE WITH [place];  Surgeon: Marylynn Pearson, MD;  Location: [place];  Service: Ophthalmology;  Laterality: Left;  . LEFT HEART CATH AND CORONARY ANGIOGRAPHY N/A 08/18/2016   Procedure: LEFT HEART CATH AND CORONARY ANGIOGRAPHY;  Surgeon: Martinique, Peter M, MD;  Location: [place] [facility];  Service: Cardiovascular;  Laterality: N/A;  . MITOMYCIN C APPLICATION Left 2/37/6283    Procedure: MITOMYCIN C APPLICATION;  Surgeon: Marylynn Pearson, MD;  Location: [place];  Service: Ophthalmology;  Laterality: Left;  . PARS PLANA VITRECTOMY  11/29/2011   Procedure: PARS PLANA VITRECTOMY WITH 23 GAUGE;  Surgeon: Adonis Brook, MD;  Location: [place];  Service: Ophthalmology;  Laterality: Right;  Right Eye 23 ga vitrectomy with membrane peel  . PARS PLANA VITRECTOMY Left 02/28/2012   Procedure: PARS PLANA VITRECTOMY WITH 23 GAUGE;  Surgeon: Adonis Brook, MD;  Location: [place];  Service: Ophthalmology;  Laterality: Left;  . REVISION UROSTOMY CUTANEOUS    . SUPRAVENTRICULAR TACHYCARDIA ABLATION  11/17/2015  . VAGINAL HYSTERECTOMY      Social History   Socioeconomic History  . Marital status: Widowed    Spouse name: Not on file  . Number of children: Not on file  . Years of education: Not on file  . Highest education level: Not on file  Social Needs  . Financial resource strain: Not on file  . Food insecurity - worry: Not on file  . Food insecurity - inability: Not on file  . Transportation needs - medical: Not on file  . Transportation needs - non-medical: Not on file  Occupational History  . Not on file  Tobacco  Use  . Smoking status: Never Smoker  . Smokeless tobacco: Never Used  Substance and Sexual Activity  . Alcohol use: No  . Drug use: No  . Sexual activity: No  Other Topics Concern  . Not on file  Social History Narrative  . Not on file    Allergies  Allergen Reactions  . Ace Inhibitors Cough  . Eggs Or Egg-Derived Products Nausea And Vomiting  . Enalapril Cough  . Lisinopril Cough  . Omnipaque [Iohexol] Hives    Family History  Problem Relation Age of Onset  . Hypertension Mother   . Coronary artery disease Mother   . Hypertension Father   . Diabetes Father   . Cancer Sister        colon  . Hypertension Other     Prior to Admission medications   Medication Sig Start Date End Date Taking? Authorizing Provider  acetaminophen (TYLENOL) 500 MG  tablet Take 500-1,000 mg by mouth every 6 (six) hours as needed (pain).     [provider]  albuterol (PROVENTIL HFA;VENTOLIN HFA) 108 (90 Base) MCG/ACT inhaler Inhale 1-2 puffs into the lungs every 6 (six) hours as needed for wheezing or shortness of breath.    [provider]  aspirin 81 MG tablet Take 1 tablet (81 mg total) by mouth daily. Patient taking differently: Take 81 mg by mouth every Tuesday, Thursday, Saturday, and Sunday.  10/29/14   Dhungel, Flonnie Overman, MD  calcium acetate (PHOSLO) 667 MG capsule Take 667-2,001 mg by mouth See admin instructions. Take 3 capsules (2001 mg) with each meal & 1-2 capsule ([phone] mg) by mouth with snacks.    [provider]  Calcium Carbonate Antacid (ALKA-SELTZER ANTACID PO) Take 2 tablets by mouth daily as needed (for indigestion).     [provider]  dorzolamide-timolol (COSOPT) 22.3-6.8 MG/ML ophthalmic solution Place 1 drop into the left eye daily.  11/09/15   [provider]  glucose blood (ONE TOUCH ULTRA TEST) test strip 1 each by Other route daily. And lancets 1/day 03/16/16   Renato Shin, MD  lidocaine-prilocaine (EMLA) cream Apply 1 application topically as needed (topical anesthesia for hemodialysis if Gebauers and Lidocaine injection are ineffective.). 08/20/16   Allie Bossier, MD  metoprolol tartrate (LOPRESSOR) 50 MG tablet Take 1 tablet (50 mg total) by mouth every Tuesday, Thursday, Saturday, and Sunday. Patient taking differently: Take 50 mg by mouth every Tuesday, Thursday, Saturday, and Sunday. In the morning. 08/20/16   Allie Bossier, MD  nitroGLYCERIN (NITROSTAT) 0.4 MG SL tablet Place 1 tablet (0.4 mg total) under the tongue every 5 (five) minutes as needed for chest pain. Patient not taking: Reported on 01/23/2017 10/29/14   Dhungel, Flonnie Overman, MD  ondansetron (ZOFRAN) 4 MG tablet Take 1 tablet (4 mg total) by mouth every 6 (six) hours as needed for nausea. Patient not taking: Reported on  01/23/2017 01/13/17   Edwin Dada, MD  oxycodone (OXY-IR) 5 MG capsule Take 1 capsule (5 mg total) by mouth every 6 (six) hours as needed for pain. 01/25/17   Plotnikov, Evie Lacks, MD  simvastatin (ZOCOR) 10 MG tablet Take 1 tablet (10 mg total) by mouth every evening. Patient taking differently: Take 10 mg by mouth daily.  05/23/16   Constance Haw, MD    Physical Exam: Vitals:   01/27/17 1446 01/27/17 1515 01/27/17 1535 01/27/17 1545  BP:  106/61    Pulse:    73  Resp:  11  16  Temp:  TempSrc:      SpO2: 100%  100%      General:  Appears calm and comfortable in no acute distress Eyes:  PERRL, EOMI, normal lids, iris ENT:  grossly normal hearing, lips & tongue, his membranes of her mouth slightly dry but pink Neck:  no LAD, masses or thyromegaly Cardiovascular:  RRR, no m/r/g. No LE edema.  Respiratory:  CTA bilaterally, no w/r/r. Normal respiratory effort. Abdomen:  soft, ntnd, positive bowel sounds ileostomy colonoscopy Skin:  no rash or induration seen on limited exam right foot status post transmetatarsal amputation and peripheral vascular disease size open wound without erythema or drainage Musculoskeletal:  grossly normal tone BUE/BLE, good ROM, no bony abnormality Psychiatric:  grossly normal mood and affect, speech fluent and appropriate, AOx3 Neurologic:  CN 2-12 grossly intact, moves all extremities in coordinated fashion, sensation intact speech clear facial symmetry  Labs on Admission: I have personally reviewed following labs and imaging studies  CBC: Recent Labs  Lab 01/22/17 1238 01/25/17 0757 01/27/17 1233  WBC 9.6  --  8.8  NEUTROABS 7.9*  --  7.6  HGB 10.9* 12.9 10.6*  HCT 34.4* 38.0 33.7*  MCV 83.7  --  84.5  PLT 309  --  248   Basic Metabolic Panel: Recent Labs  Lab 01/22/17 1238 01/25/17 0757 01/27/17 1233  NA 136 139 138  K 4.0 3.9 3.9  CL 95* 97* 95*  CO2 24  --  23  GLUCOSE 205* 97 185*  BUN 28* 22* 18  CREATININE 5.10*  4.10* 3.80*  CALCIUM 8.6*  --  8.0*   GFR: Estimated Creatinine Clearance: 12.1 mL/min (A) (by C-G formula based on SCr of 3.8 mg/dL (H)). Liver Function Tests: Recent Labs  Lab 01/22/17 1238 01/27/17 1233  AST 17 18  ALT 11* 10*  ALKPHOS 158* 141*  BILITOT 0.9 0.9  PROT 7.3 6.8  ALBUMIN 2.6* 2.7*   No results for input(s): LIPASE, AMYLASE in the last 168 hours. No results for input(s): AMMONIA in the last 168 hours. Coagulation Profile: No results for input(s): INR, PROTIME in the last 168 hours. Cardiac Enzymes: No results for input(s): CKTOTAL, CKMB, CKMBINDEX, TROPONINI in the last 168 hours. BNP (last 3 results) No results for input(s): PROBNP in the last 8760 hours. HbA1C: No results for input(s): HGBA1C in the last 72 hours. CBG: Recent Labs  Lab 01/25/17 1134  GLUCAP 103*   Lipid Profile: No results for input(s): CHOL, HDL, LDLCALC, TRIG, CHOLHDL, LDLDIRECT in the last 72 hours. Thyroid Function Tests: No results for input(s): TSH, T4TOTAL, FREET4, T3FREE, THYROIDAB in the last 72 hours. Anemia Panel: No results for input(s): VITAMINB12, FOLATE, FERRITIN, TIBC, IRON, RETICCTPCT in the last 72 hours. Urine analysis:    Component Value Date/Time   COLORURINE YELLOW 01/22/2017 1428   APPEARANCEUR CLOUDY (A) 01/22/2017 1428   LABSPEC 1.020 01/22/2017 1428   PHURINE 8.5 (H) 01/22/2017 1428   GLUCOSEU NEGATIVE 01/22/2017 1428   HGBUR LARGE (A) 01/22/2017 1428   BILIRUBINUR NEGATIVE 01/22/2017 1428   KETONESUR NEGATIVE 01/22/2017 1428   PROTEINUR >300 (A) 01/22/2017 1428   UROBILINOGEN 4.0 (H) 05/20/2011 2219   NITRITE NEGATIVE 01/22/2017 1428   LEUKOCYTESUR NEGATIVE 01/22/2017 1428    Creatinine Clearance: Estimated Creatinine Clearance: 12.1 mL/min (A) (by C-G formula based on SCr of 3.8 mg/dL (H)).  Sepsis Labs: @LABRCNTIP (procalcitonin:4,lacticidven:4) ) Recent Results (from the past 240 hour(s))  Urine Culture     Status: Abnormal   Collection  Time: 01/22/17  2:28 PM  Result Value Ref Range Status   Specimen Description URINE, RANDOM  Final   Special Requests NONE  Final   Culture MULTIPLE SPECIES PRESENT, SUGGEST RECOLLECTION (A)  Final   Report Status 01/23/2017 FINAL  Final     Radiological Exams on Admission: Dg Chest Port 1 View  Result Date: 01/27/2017 CLINICAL DATA:  Hypoxia. EXAM: PORTABLE CHEST 1 VIEW COMPARISON:  01/22/2017 FINDINGS: The cardiac silhouette is enlarged, similar to the prior radiograph. Mediastinal contours appear intact. Calcific atherosclerotic disease of the aorta. Deviation of the proximal trachea to the right, with fullness in the left superior hemithorax. There is no evidence of focal airspace consolidation, pleural effusion or pneumothorax. Osseous structures are without acute abnormality. Soft tissues are grossly normal. IMPRESSION: Enlargement of the cardiac silhouette, which may be due to cardiomegaly or pericardial effusion. Calcific atherosclerotic disease of the aorta. Deviation of the proximal trachea to the right with apparent fullness in the left superior hemithorax. This may represent a thyroid or mediastinal mass displacing the trachea, or potentially subpleural pulmonary nodule. Further evaluation with contrast-enhanced chest CT may be considered. Electronically Signed   By: Fidela Salisbury M.D.   On: 01/27/2017 12:47    EKG: Sinus rhythm Prolonged PR interval Nonspecific intraventricular conduction delay Consider anterior infarct no significant change since earlier  Assessment/Plan Principal Problem:   Elevated lactic acid level Active Problems:   Foot ulcer (HCC)   Coronary artery disease   Anemia   Obesity   Ischemic cardiomyopathy   Diabetes (HCC)   Chronic systolic CHF (congestive heart failure) (H[facility])   End-stage renal disease on hemodialysis ([place])   Hypotension   Deviated trachea   #1. Elevated lactic acid/hypotension. Etiology unclear. Chest x-rayEnlargement of the  cardiac silhouette, which may be due to cardiomegaly or pericardial effusion. Pretty stable to previous images. Await urinalysis. Initial troponin negative Of note patient hospitalized 2 weeks ago for sepsis secondary to urinary tract infection. Chart review indicates at that time urine culture had no growth. She was given Zosyn for 2 days. Code sepsis was called she was provided with IV fluids and broad-spectrum IV antibiotics. -Admit to telemetry -Cycle troponin -Follow blood cultures -Track lactic acid -Continue antibiotics  #2. Sudden vomiting/deviated trachea. X-ray reveals deviated trachea. Concern for suicidal mass or subpleural nodule. -Follow results of CT of the chest -Clear liquids -TSH -Likely need outpatient follow-up  #3.end stage renal disease on hemodialysis. Is a Monday Wednesday Friday dialysis patient. Completed dialysis yesterday. Estimated 3.8. Potassium 3.9 -left message for dialysis in case she is here Monday  4. Chronic systolic heart failure. Does not appear overloaded. Home medications include metoprolol -Hold metoprolol for now -Monitor intake and output -Daily weights  5. CAD. No chest pain. Initial troponin negative. EKG as noted above.   DVT prophylaxis: heparin  Code Status: full  Family Communication: none present  Disposition Plan: home  Consults called: none  Admission status: obs    Dyanne Carrel M MD [facility]  If 7PM-7AM, please contact night-coverage [email or web address] Password [facility]  01/27/2017, 4:47 PM

## 2017-01-28 ENCOUNTER — Other Ambulatory Visit: Payer: Self-pay

## 2017-01-28 DIAGNOSIS — D631 Anemia in chronic kidney disease: Secondary | ICD-10-CM | POA: Diagnosis not present

## 2017-01-28 DIAGNOSIS — N390 Urinary tract infection, site not specified: Secondary | ICD-10-CM | POA: Diagnosis not present

## 2017-01-28 DIAGNOSIS — Z992 Dependence on renal dialysis: Secondary | ICD-10-CM

## 2017-01-28 DIAGNOSIS — Z515 Encounter for palliative care: Secondary | ICD-10-CM | POA: Diagnosis not present

## 2017-01-28 DIAGNOSIS — R627 Adult failure to thrive: Secondary | ICD-10-CM | POA: Diagnosis not present

## 2017-01-28 DIAGNOSIS — I10 Essential (primary) hypertension: Secondary | ICD-10-CM | POA: Diagnosis not present

## 2017-01-28 DIAGNOSIS — R7989 Other specified abnormal findings of blood chemistry: Secondary | ICD-10-CM

## 2017-01-28 DIAGNOSIS — H409 Unspecified glaucoma: Secondary | ICD-10-CM | POA: Diagnosis present

## 2017-01-28 DIAGNOSIS — I2510 Atherosclerotic heart disease of native coronary artery without angina pectoris: Secondary | ICD-10-CM | POA: Diagnosis not present

## 2017-01-28 DIAGNOSIS — K219 Gastro-esophageal reflux disease without esophagitis: Secondary | ICD-10-CM | POA: Diagnosis not present

## 2017-01-28 DIAGNOSIS — I447 Left bundle-branch block, unspecified: Secondary | ICD-10-CM | POA: Diagnosis present

## 2017-01-28 DIAGNOSIS — L97512 Non-pressure chronic ulcer of other part of right foot with fat layer exposed: Secondary | ICD-10-CM | POA: Diagnosis not present

## 2017-01-28 DIAGNOSIS — I959 Hypotension, unspecified: Secondary | ICD-10-CM | POA: Diagnosis not present

## 2017-01-28 DIAGNOSIS — I5022 Chronic systolic (congestive) heart failure: Secondary | ICD-10-CM

## 2017-01-28 DIAGNOSIS — I080 Rheumatic disorders of both mitral and aortic valves: Secondary | ICD-10-CM | POA: Diagnosis present

## 2017-01-28 DIAGNOSIS — I255 Ischemic cardiomyopathy: Secondary | ICD-10-CM | POA: Diagnosis present

## 2017-01-28 DIAGNOSIS — E669 Obesity, unspecified: Secondary | ICD-10-CM | POA: Diagnosis not present

## 2017-01-28 DIAGNOSIS — E1122 Type 2 diabetes mellitus with diabetic chronic kidney disease: Secondary | ICD-10-CM | POA: Diagnosis not present

## 2017-01-28 DIAGNOSIS — E1151 Type 2 diabetes mellitus with diabetic peripheral angiopathy without gangrene: Secondary | ICD-10-CM | POA: Diagnosis not present

## 2017-01-28 DIAGNOSIS — Z933 Colostomy status: Secondary | ICD-10-CM | POA: Diagnosis not present

## 2017-01-28 DIAGNOSIS — J9601 Acute respiratory failure with hypoxia: Secondary | ICD-10-CM | POA: Diagnosis present

## 2017-01-28 DIAGNOSIS — N2581 Secondary hyperparathyroidism of renal origin: Secondary | ICD-10-CM | POA: Diagnosis present

## 2017-01-28 DIAGNOSIS — A419 Sepsis, unspecified organism: Secondary | ICD-10-CM | POA: Diagnosis present

## 2017-01-28 DIAGNOSIS — E1165 Type 2 diabetes mellitus with hyperglycemia: Secondary | ICD-10-CM | POA: Diagnosis not present

## 2017-01-28 DIAGNOSIS — N186 End stage renal disease: Secondary | ICD-10-CM

## 2017-01-28 DIAGNOSIS — I248 Other forms of acute ischemic heart disease: Secondary | ICD-10-CM | POA: Diagnosis present

## 2017-01-28 DIAGNOSIS — N179 Acute kidney failure, unspecified: Secondary | ICD-10-CM | POA: Diagnosis not present

## 2017-01-28 DIAGNOSIS — I9589 Other hypotension: Secondary | ICD-10-CM | POA: Diagnosis not present

## 2017-01-28 DIAGNOSIS — L97519 Non-pressure chronic ulcer of other part of right foot with unspecified severity: Secondary | ICD-10-CM | POA: Diagnosis present

## 2017-01-28 DIAGNOSIS — Z66 Do not resuscitate: Secondary | ICD-10-CM | POA: Diagnosis not present

## 2017-01-28 DIAGNOSIS — E11621 Type 2 diabetes mellitus with foot ulcer: Secondary | ICD-10-CM | POA: Diagnosis present

## 2017-01-28 DIAGNOSIS — E872 Acidosis: Secondary | ICD-10-CM | POA: Diagnosis present

## 2017-01-28 DIAGNOSIS — E785 Hyperlipidemia, unspecified: Secondary | ICD-10-CM | POA: Diagnosis not present

## 2017-01-28 DIAGNOSIS — Z6833 Body mass index (BMI) 33.0-33.9, adult: Secondary | ICD-10-CM | POA: Diagnosis not present

## 2017-01-28 DIAGNOSIS — I132 Hypertensive heart and chronic kidney disease with heart failure and with stage 5 chronic kidney disease, or end stage renal disease: Secondary | ICD-10-CM | POA: Diagnosis not present

## 2017-01-28 LAB — URINALYSIS, ROUTINE W REFLEX MICROSCOPIC
BILIRUBIN URINE: NEGATIVE
Glucose, UA: NEGATIVE mg/dL
Ketones, ur: NEGATIVE mg/dL
NITRITE: NEGATIVE
PH: 8 (ref 5.0–8.0)
Protein, ur: 100 mg/dL — AB
SPECIFIC GRAVITY, URINE: 1.012 (ref 1.005–1.030)

## 2017-01-28 LAB — CBC
HEMATOCRIT: 33.1 % — AB (ref 36.0–46.0)
HEMOGLOBIN: 10.3 g/dL — AB (ref 12.0–15.0)
MCH: 26.3 pg (ref 26.0–34.0)
MCHC: 31.1 g/dL (ref 30.0–36.0)
MCV: 84.4 fL (ref 78.0–100.0)
Platelets: 238 10*3/uL (ref 150–400)
RBC: 3.92 MIL/uL (ref 3.87–5.11)
RDW: 18.2 % — ABNORMAL HIGH (ref 11.5–15.5)
WBC: 6 10*3/uL (ref 4.0–10.5)

## 2017-01-28 LAB — BASIC METABOLIC PANEL
ANION GAP: 19 — AB (ref 5–15)
BUN: 22 mg/dL — ABNORMAL HIGH (ref 6–20)
CHLORIDE: 97 mmol/L — AB (ref 101–111)
CO2: 22 mmol/L (ref 22–32)
CREATININE: 4 mg/dL — AB (ref 0.44–1.00)
Calcium: 8 mg/dL — ABNORMAL LOW (ref 8.9–10.3)
GFR calc non Af Amer: 10 mL/min — ABNORMAL LOW (ref >60)
GFR, EST AFRICAN AMERICAN: 12 mL/min — AB (ref >60)
Glucose, Bld: 245 mg/dL — ABNORMAL HIGH (ref 65–99)
POTASSIUM: 4.2 mmol/L (ref 3.5–5.1)
SODIUM: 138 mmol/L (ref 135–145)

## 2017-01-28 LAB — TROPONIN I: Troponin I: 0.15 ng/mL (ref <0.03)

## 2017-01-28 LAB — GLUCOSE, CAPILLARY
GLUCOSE-CAPILLARY: 173 mg/dL — AB (ref 65–99)
Glucose-Capillary: 237 mg/dL — ABNORMAL HIGH (ref 65–99)
Glucose-Capillary: 220 mg/dL — ABNORMAL HIGH (ref 65–99)
Glucose-Capillary: 166 mg/dL — ABNORMAL HIGH (ref 65–99)

## 2017-01-28 LAB — TSH: TSH: 0.957 u[IU]/mL (ref 0.350–4.500)

## 2017-01-28 LAB — T4, FREE: Free T4: 0.96 ng/dL (ref 0.61–1.12)

## 2017-01-28 MED ORDER — VANCOMYCIN HCL IN DEXTROSE 750-5 MG/150ML-% IV SOLN
750.0000 mg | INTRAVENOUS | Status: DC
  Filled 2017-01-28: qty 150

## 2017-01-28 MED ORDER — METOPROLOL TARTRATE 12.5 MG HALF TABLET
12.5000 mg | ORAL_TABLET | Freq: Two times a day (BID) | ORAL | Status: DC
  Administered 2017-01-28 (×2): 12.5 mg via ORAL
  Filled 2017-01-28 (×2): qty 1

## 2017-01-28 NOTE — Care Management Note (Signed)
Case Management Note  Patient Details  Name: [name] MRN: [id number] Date of Birth: May 08, 1945  Subjective/Objective:           Pt presented for syncope, elevated lactic acid levels, hypotension, and hypoxia.  This is pt's 3rd admission this month.  Pt from home with daughter.  Pt uses walker and wheelchair at home.  Pt set up with [facility] last admission and receiving services.    Action/Plan: CM will continue to follow.   Expected Discharge Date:                  Expected Discharge Plan:  [place]  In-House Referral:  NA  Discharge planning Services  CM Consult  Post Acute Care Choice:    Choice offered to:     DME Arranged:    DME Agency:     HH Arranged:    HH Agency:     Status of Service:  In process, will continue to follow  If discussed at Long Length of Stay Meetings, dates discussed:    Additional Comments:  Arley Phenix, RN 01/28/2017, 5:08 PM

## 2017-01-28 NOTE — Care Management Obs Status (Signed)
[place] NOTIFICATION   Patient Details  Name: EMMALOU HUNGER MRN: [id number] Date of Birth: 07-08-1945   Medicare Observation Status Notification Given:  Yes    Arley Phenix, RN 01/28/2017, 5:05 PM

## 2017-01-28 NOTE — Progress Notes (Signed)
PROGRESS NOTE    [name]  ZLD:[id number] DOB: 1945/12/12 DOA: 01/27/2017 PCP: Cassandria Anger, MD   Brief Narrative: 72 year old female with history of ESRD on hemodialysis Monday Wednesday Friday, cardiomyopathy, a stroke, chronic systolic CHF with EF of 90%, diabetes, colon cancer status post colostomy, PEA arrest during EGD in August 2018, recent hospitalization for suspected urosepsis, chronic right foot ulcer presented to the ER with nausea vomiting, hypotension to 60s.  In the ER, patient's blood pressure was systolic 300 and oxygen saturation 79% in room air.  Admitted for further evaluation.  Assessment & Plan:   #Elevated lactic acid, hypotension in the setting of nausea vomiting, concern for sepsis: Patient has chronic right foot ulcer and she is a hemodialysis patient via AV fistula.  She has a chronic elevation in troponin level.  Follow-up culture results.  Continue broad-spectrum antibiotics with IV vancomycin and Zosyn for now.  Monitor labs. -Chest x-ray with no pneumonia. -UA with UTI which could be the source of infection.  Patient is however ESRD with minimal urine output.  Unknown if it is contaminant.  #Nausea vomiting, non-intractable: Advance diet to renal today.  Abdomen exam benign.  She has a colostomy.  #Acute respiratory failure for with hypoxia on admission: CT scan of chest with no PE.  #ESRD on hemodialysis: No urgent indication for dialysis today.  Discussed with nephrologist.  #Chronic systolic congestive heart failure: Continue home medication.  Blood pressure is better controlled now.  I will start low-dose metoprolol.  #History of coronary artery disease: No chest pain.  She has chronic elevation in troponin likely demand ischemia and due to ESRD.  #Chronic right foot ulcer: Continue local care.  #Type 2 diabetes with hyperglycemia: A1c 8.4.  Continue current insulin regimen.  Monitor blood sugar level.  Goals of care: Patient with  chronic multiple comorbidities.  I will consult palliative care. PT, OT evaluation.  DVT prophylaxis: Heparin subcutaneous Code Status: Full code Family Communication: No family at bedside Disposition Plan: Currently admitted    Consultants:   Palliative care  Procedures: None Antimicrobials: Vancomycin and Zosyn  Subjective: Seen and examined at bedside.  Patient is alert awake and oriented to herself and being in the hospital.  Denies chest pain, shortness of breath, nausea or vomiting.  Objective: Vitals:   01/27/17 2200 01/27/17 2204 01/27/17 2331 01/28/17 0455  BP: 119/60  112/60 (!) 154/80  Pulse:  79 75   Resp: 12  18 18   Temp:   97.8 F (36.6 C)   TempSrc:   Oral   SpO2:  100% 95% 96%    Intake/Output Summary (Last 24 hours) at 01/28/2017 1112 Last data filed at 01/28/2017 9233 Gross per 24 hour  Intake 9526.67 ml  Output 0 ml  Net 9526.67 ml   There were no vitals filed for this visit.  Examination:  General exam: Ill looking elderly female lying in bed Respiratory system: Clear to auscultation. Respiratory effort normal. No wheezing or crackle Cardiovascular system: S1 & S2 heard, RRR.  No pedal edema. Gastrointestinal system: Has colostomy.  Abdomen soft, nontender. Central nervous system: Alert and oriented. No focal neurological deficits. Extremities: Right foot has dressing applied.      Data Reviewed: I have personally reviewed following labs and imaging studies  CBC: Recent Labs  Lab 01/22/17 1238 01/25/17 0757 01/27/17 1233 01/28/17 0649  WBC 9.6  --  8.8 6.0  NEUTROABS 7.9*  --  7.6  --   HGB 10.9* 12.9 10.6*  10.3*  HCT 34.4* 38.0 33.7* 33.1*  MCV 83.7  --  84.5 84.4  PLT 309  --  251 149   Basic Metabolic Panel: Recent Labs  Lab 01/22/17 1238 01/25/17 0757 01/27/17 1233 01/27/17 1654 01/28/17 0649  NA 136 139 138  --  138  K 4.0 3.9 3.9  --  4.2  CL 95* 97* 95*  --  97*  CO2 24  --  23  --  22  GLUCOSE 205* 97 185*  --   245*  BUN 28* 22* 18  --  22*  CREATININE 5.10* 4.10* 3.80* 3.53* 4.00*  CALCIUM 8.6*  --  8.0*  --  8.0*   GFR: Estimated Creatinine Clearance: 11.5 mL/min (A) (by C-G formula based on SCr of 4 mg/dL (H)). Liver Function Tests: Recent Labs  Lab 01/22/17 1238 01/27/17 1233  AST 17 18  ALT 11* 10*  ALKPHOS 158* 141*  BILITOT 0.9 0.9  PROT 7.3 6.8  ALBUMIN 2.6* 2.7*   No results for input(s): LIPASE, AMYLASE in the last 168 hours. No results for input(s): AMMONIA in the last 168 hours. Coagulation Profile: No results for input(s): INR, PROTIME in the last 168 hours. Cardiac Enzymes: Recent Labs  Lab 01/27/17 1654 01/27/17 2308 01/28/17 0649  TROPONINI 0.08* 0.13* 0.15*   BNP (last 3 results) No results for input(s): PROBNP in the last 8760 hours. HbA1C: Recent Labs    01/27/17 1309  HGBA1C 8.4*   CBG: Recent Labs  Lab 01/25/17 1134 01/27/17 1700 01/27/17 1832 01/27/17 2334 01/28/17 0747  GLUCAP 103* 110* 108* 230* 237*   Lipid Profile: No results for input(s): CHOL, HDL, LDLCALC, TRIG, CHOLHDL, LDLDIRECT in the last 72 hours. Thyroid Function Tests: Recent Labs    01/28/17 0016  TSH 0.957  FREET4 0.96   Anemia Panel: No results for input(s): VITAMINB12, FOLATE, FERRITIN, TIBC, IRON, RETICCTPCT in the last 72 hours. Sepsis Labs: Recent Labs  Lab 01/22/17 1251 01/27/17 1318 01/27/17 1659 01/27/17 2037  LATICACIDVEN 3.30* 6.06* 4.86* 4.0*    Recent Results (from the past 240 hour(s))  Urine Culture     Status: Abnormal   Collection Time: 01/22/17  2:28 PM  Result Value Ref Range Status   Specimen Description URINE, RANDOM  Final   Special Requests NONE  Final   Culture MULTIPLE SPECIES PRESENT, SUGGEST RECOLLECTION (A)  Final   Report Status 01/23/2017 FINAL  Final         Radiology Studies: Ct Angio Chest Pe W/cm &/or Wo Cm  Result Date: 01/27/2017 CLINICAL DATA:  Shortness of breath EXAM: CT ANGIOGRAPHY CHEST WITH CONTRAST TECHNIQUE:  Multidetector CT imaging of the chest was performed using the standard protocol during bolus administration of intravenous contrast. Multiplanar CT image reconstructions and MIPs were obtained to evaluate the vascular anatomy. CONTRAST:  160mL ISOVUE-370 IOPAMIDOL (ISOVUE-370) INJECTION 76% COMPARISON:  Chest radiograph 01/27/2017 FINDINGS: Cardiovascular: Contrast injection is sufficient to demonstrate satisfactory opacification of the pulmonary arteries to the segmental level. There is no pulmonary embolus. The main pulmonary artery is within normal limits for size. There is no CT evidence of acute right heart strain. There is moderate calcific aortic atherosclerosis. There is also coronary artery calcification. There is a normal 3-vessel arch branching pattern. Heart size is enlarged. No pericardial effusion. Mediastinum/Nodes: No mediastinal, hilar or axillary lymphadenopathy. The visualized thyroid and thoracic esophageal course are unremarkable. Lungs/Pleura: Small pleural effusions, right greater than left, with associated atelectasis. No focal consolidation otherwise. No pulmonary nodules  or masses. Upper Abdomen: Contrast bolus timing is not optimized for evaluation of the abdominal organs. 4 x 3 cm hypoattenuating focus in the spleen. Otherwise unremarkable visualized upper abdominal structures. Musculoskeletal: Degenerative disc disease without spinal canal stenosis. Review of the MIP images confirms the above findings. IMPRESSION: 1. No pulmonary embolus. 2. Small pleural effusions, right greater than left. 3. Cardiomegaly, coronary artery atherosclerosis and aortic atherosclerosis (ICD10-I70.0). 4. Hypoattenuating lesion of the spleen, incompletely visualized and indeterminate on this arterial phase study. This is probably benign and could be further characterized with nonemergent CT of the abdomen and pelvis on a nonemergent basis. Electronically Signed   By: Ulyses Jarred M.D.   On: 01/27/2017 23:03     Dg Chest Port 1 View  Result Date: 01/27/2017 CLINICAL DATA:  Hypoxia. EXAM: PORTABLE CHEST 1 VIEW COMPARISON:  01/22/2017 FINDINGS: The cardiac silhouette is enlarged, similar to the prior radiograph. Mediastinal contours appear intact. Calcific atherosclerotic disease of the aorta. Deviation of the proximal trachea to the right, with fullness in the left superior hemithorax. There is no evidence of focal airspace consolidation, pleural effusion or pneumothorax. Osseous structures are without acute abnormality. Soft tissues are grossly normal. IMPRESSION: Enlargement of the cardiac silhouette, which may be due to cardiomegaly or pericardial effusion. Calcific atherosclerotic disease of the aorta. Deviation of the proximal trachea to the right with apparent fullness in the left superior hemithorax. This may represent a thyroid or mediastinal mass displacing the trachea, or potentially subpleural pulmonary nodule. Further evaluation with contrast-enhanced chest CT may be considered. Electronically Signed   By: Fidela Salisbury M.D.   On: 01/27/2017 12:47        Scheduled Meds: . aspirin EC  81 mg Oral Q T,Th,S,Su  . calcium acetate  2,001 mg Oral TID WC  . calcium acetate  667 mg Oral With snacks  . dorzolamide-timolol  1 drop Left Eye Daily  . heparin  5,000 Units Subcutaneous Q8H  . insulin aspart  0-5 Units Subcutaneous QHS  . insulin aspart  0-9 Units Subcutaneous TID WC  . simvastatin  10 mg Oral Daily   Continuous Infusions: . piperacillin-tazobactam (ZOSYN)  IV 3.375 g (01/28/17 0925)  . [START ON 01/29/2017] vancomycin       LOS: 0 days    Dron Tanna Furry, MD [facility] Pager [phone]  If 7PM-7AM, please contact night-coverage [email or web address] Password [email or web address] 01/28/2017, 11:12 AM

## 2017-01-28 NOTE — Progress Notes (Signed)
Contacted lab about the stat troponin that has not been collected.  Lab to collect soon.

## 2017-01-29 ENCOUNTER — Other Ambulatory Visit: Payer: Self-pay | Admitting: *Deleted

## 2017-01-29 ENCOUNTER — Telehealth: Payer: Self-pay | Admitting: Internal Medicine

## 2017-01-29 ENCOUNTER — Other Ambulatory Visit: Payer: Self-pay | Admitting: Licensed Clinical Social Worker

## 2017-01-29 ENCOUNTER — Encounter: Payer: Self-pay | Admitting: *Deleted

## 2017-01-29 DIAGNOSIS — E785 Hyperlipidemia, unspecified: Secondary | ICD-10-CM | POA: Diagnosis not present

## 2017-01-29 DIAGNOSIS — D631 Anemia in chronic kidney disease: Secondary | ICD-10-CM

## 2017-01-29 DIAGNOSIS — I2510 Atherosclerotic heart disease of native coronary artery without angina pectoris: Secondary | ICD-10-CM

## 2017-01-29 DIAGNOSIS — Z933 Colostomy status: Secondary | ICD-10-CM | POA: Diagnosis not present

## 2017-01-29 DIAGNOSIS — I132 Hypertensive heart and chronic kidney disease with heart failure and with stage 5 chronic kidney disease, or end stage renal disease: Secondary | ICD-10-CM

## 2017-01-29 DIAGNOSIS — K219 Gastro-esophageal reflux disease without esophagitis: Secondary | ICD-10-CM | POA: Diagnosis not present

## 2017-01-29 DIAGNOSIS — E1151 Type 2 diabetes mellitus with diabetic peripheral angiopathy without gangrene: Secondary | ICD-10-CM | POA: Diagnosis not present

## 2017-01-29 DIAGNOSIS — I9589 Other hypotension: Secondary | ICD-10-CM

## 2017-01-29 DIAGNOSIS — I5022 Chronic systolic (congestive) heart failure: Secondary | ICD-10-CM | POA: Diagnosis not present

## 2017-01-29 DIAGNOSIS — E1122 Type 2 diabetes mellitus with diabetic chronic kidney disease: Secondary | ICD-10-CM | POA: Diagnosis not present

## 2017-01-29 DIAGNOSIS — N186 End stage renal disease: Secondary | ICD-10-CM | POA: Diagnosis not present

## 2017-01-29 DIAGNOSIS — N390 Urinary tract infection, site not specified: Secondary | ICD-10-CM | POA: Diagnosis not present

## 2017-01-29 DIAGNOSIS — E669 Obesity, unspecified: Secondary | ICD-10-CM | POA: Diagnosis not present

## 2017-01-29 LAB — RENAL FUNCTION PANEL
ALBUMIN: 2.4 g/dL — AB (ref 3.5–5.0)
ANION GAP: 19 — AB (ref 5–15)
BUN: 24 mg/dL — ABNORMAL HIGH (ref 6–20)
CALCIUM: 8.4 mg/dL — AB (ref 8.9–10.3)
CO2: 21 mmol/L — ABNORMAL LOW (ref 22–32)
CREATININE: 4.5 mg/dL — AB (ref 0.44–1.00)
Chloride: 99 mmol/L — ABNORMAL LOW (ref 101–111)
GFR calc non Af Amer: 9 mL/min — ABNORMAL LOW (ref >60)
GFR, EST AFRICAN AMERICAN: 10 mL/min — AB (ref >60)
Glucose, Bld: 113 mg/dL — ABNORMAL HIGH (ref 65–99)
PHOSPHORUS: 4.7 mg/dL — AB (ref 2.5–4.6)
Potassium: 3.9 mmol/L (ref 3.5–5.1)
SODIUM: 139 mmol/L (ref 135–145)

## 2017-01-29 LAB — CBC
HCT: 32.5 % — ABNORMAL LOW (ref 36.0–46.0)
HEMOGLOBIN: 10.5 g/dL — AB (ref 12.0–15.0)
MCH: 27 pg (ref 26.0–34.0)
MCHC: 32.3 g/dL (ref 30.0–36.0)
MCV: 83.5 fL (ref 78.0–100.0)
PLATELETS: 234 10*3/uL (ref 150–400)
RBC: 3.89 MIL/uL (ref 3.87–5.11)
RDW: 19 % — ABNORMAL HIGH (ref 11.5–15.5)
WBC: 7.3 10*3/uL (ref 4.0–10.5)

## 2017-01-29 LAB — LACTIC ACID, PLASMA: LACTIC ACID, VENOUS: 6.2 mmol/L — AB (ref 0.5–1.9)

## 2017-01-29 LAB — GLUCOSE, CAPILLARY
Glucose-Capillary: 94 mg/dL (ref 65–99)
Glucose-Capillary: 143 mg/dL — ABNORMAL HIGH (ref 65–99)
Glucose-Capillary: 101 mg/dL — ABNORMAL HIGH (ref 65–99)
Glucose-Capillary: 134 mg/dL — ABNORMAL HIGH (ref 65–99)

## 2017-01-29 MED ORDER — MIDODRINE HCL 5 MG PO TABS
5.0000 mg | ORAL_TABLET | ORAL | Status: AC
  Administered 2017-01-29: 5 mg via ORAL
  Filled 2017-01-29: qty 1

## 2017-01-29 MED ORDER — CALCITRIOL 0.5 MCG PO CAPS: 1.0000 ug | ORAL_CAPSULE | ORAL | Status: DC | PRN

## 2017-01-29 MED ORDER — CALCIUM ACETATE (PHOS BINDER) 667 MG PO CAPS: 667.0000 mg | ORAL_CAPSULE | Freq: Every day | ORAL | Status: DC | PRN

## 2017-01-29 NOTE — Telephone Encounter (Signed)
Copied from [place] [id number]. Topic: Quick Communication - See Telephone Encounter >> Jan 29, 2017 11:14 AM Boyd Kerbs wrote: CRM for notification. See Telephone encounter for:   Daughter, [name], pt. Is in the hospital now may get out tomorrow, and she is in need a hospital bed.  Can not get out of her own bed now. PA possible.   01/29/17.

## 2017-01-29 NOTE — Patient Outreach (Signed)
[facility]) Care Management  01/29/2017  MESHAWN OCONNOR 1945/08/26 [id number]  Assessment- CSW has not received return call from patient or family since first initial outreach call. CSW received notification for inpatient hospitalization. CSW will continue to follow case and will await for hospital discharge before completing second outreach attempt.  Plan-CSW will await for hospital discharge and then complete second outreach attempt.  Eula Fried, BSW, MSW, [email or web address] Phone: [phone] Fax: [phone]

## 2017-01-29 NOTE — Progress Notes (Signed)
Ordered colostomy bag and wafer but was told we are out of stock of what the patient has. Patient thought she has supply at bedside but we looked and she didn't. Called patient's daughter and she will bring supply for colostomy and urostomy.  Ayelen Sciortino, Wonda Cheng, Therapist, sports

## 2017-01-29 NOTE — Progress Notes (Signed)
PT Cancellation Note  Patient Details Name: [name] MRN: [id number] DOB: May 10, 1945   Cancelled Treatment:    Reason Eval/Treat Not Completed: Medical issues which prohibited therapy.  Very elevated lactic acid this AM and was having elevating troponins.  Pt also needs a WB status on her R foot to progress standing.  Will try later as time and pt allow.   Ramond Dial 01/29/2017, 11:19 AM   Mee Hives, PT MS Acute Rehab Dept. Number: [place] and [place]

## 2017-01-29 NOTE — Care Management (Signed)
Patient from home with daughter. Per patient daughter can provide 24 hour care. Patient has wheelchair, shower chair and walker at home already. Patient requesting hospital bed for home.   Patient states she has Location manager and PT at home. Will need resumption of care orders and face to face. Left message for Butch Penny with [facility].  PT getting ready to work with patient . Await PT recommendations. Patient voiced understanding.   Magdalen Spatz RN BSN [phone]

## 2017-01-29 NOTE — Telephone Encounter (Signed)
Spoke with [name] and [name] and informed pt that she needs to get the case manager at the hospital to assess the pt and they can expedite  the process of getting the bed faster than we can

## 2017-01-29 NOTE — Patient Outreach (Signed)
[facility]) Care Management [place] Coordination  01/29/2017  [name] [birth year] [id number]  Care coordination outreach re: [name] is an 72 y.o. female currently active with [facility] after recent hospitalization January 2-5, 2019 for nausea, vomiting, flank pain; patient was diagnosed with sepsis/ pyelonephritis during hospitalization. Patient was discharged home to self-care with home health services in place through [place] for PT/ OT. 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  Received notification that patient re-presented to hospital January 27, 2017 for nausea, vomiting, hypotension and hypoxia.  Secure communication via EMR sent to [place], [place] [facility] CSW [name], notifying of patient's current hospital admission (observation status).  Plan:  [facility] RN CM will follow patient's progress while hospitalized and collaborate with [facility] liaison's for discharge disposition.  Oneta Rack, RN, BSN, [employer] [facility] Care Management  [phone]

## 2017-01-29 NOTE — Consult Note (Signed)
[facility] Renal Consultation Note    Indication for Consultation:  Management of ESRD/hemodialysis; anemia, hypertension/volume and secondary hyperparathyroidism  YHC:[id number], Evie Lacks, MD  HPI: [name] is a 72 y.o. female. 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  Past medical history significant for CM (EF 25%), CAD w/NSTEMI s/p PCI (2016), h/o SVT (s/p ablation 2017) acute/subacute CVA (2012), h/o colon cancer with bladder involvement (s/p colostomy, cystectomy utereral implant and urostomy 1986), s/p R transmetatarsal amputation (2012) and h/o PEA during EGD (08/2016).  Of note patient is compliant with prescribed dialysis regimen, and tolerated her full treatment on Friday without complication, getting close to her dry weight.  On 1/17 she had an aortogram and bilateral leg runoff completed without complication, due to R foot ulcer, by Dr. Bridgett Larsson.  Patient was admitted for observation over the weekend due to concerns for sepsis after presenting with hypotension, n/v, elevated lactic acid in the setting of a chronic R foot ulcer.  Seen an examined at bedside with daughter present, patient reports feeling well most of the week with nausea and vomiting starting on Saturday.  Patient answering all of my questions but she appears distracted, staring off into space and requiring me to repeat questions.  She believed she was in a house until I told her she was at the hospital and then she knew it was [facility]. Per her daughter she noticed confusion starting about 1 week ago and has slowly worsened throughout the week and not returned to baseline.  Patient denies recent n/v, abdominal pain, weakness, dizziness, chest pain, SOB, dyspnea and edema.  Past Medical History:  Diagnosis Date  . Acute respiratory failure with hypoxia ([place]) 10/23/2014  . Adenomatous duodenal polyp   . Anemia   . Arthritis    "hands" (11/17/2015)  . Atrial tachycardia ([place])    on  amiod  . Cardiomyopathy- mixed   . CHF (congestive heart failure) ([place])   . Chronic diastolic heart failure ([place])   . Colon cancer ([place]) 1986  . Colostomy in place [facility])   . Coronary artery disease    Previously decreased EF; echo 113 normal LV function  . Deviated trachea   . Dialysis patient ([place])   . ESRD (end stage renal disease) on dialysis [facility])    Dr Dunham/Dr. Lyda Kalata.  M, W, Fr; [place] (11/17/2015)  . GERD (gastroesophageal reflux disease)   . Glaucoma   . Heart murmur   . Hernia, incisional    abd  . Hyperlipidemia   . Hypertension   . Hypertensive heart disease    sees Dr. Alain Marion  . LBBB (left bundle branch block)   . Mitral valve insufficiency and aortic valve insufficiency   . Obesity   . Stroke [facility])    2011/12  . Type II diabetes mellitus ([place])    Past Surgical History:  Procedure Laterality Date  . ABDOMINAL AORTOGRAM W/LOWER EXTREMITY N/A 01/25/2017   Procedure: ABDOMINAL AORTOGRAM W/LOWER EXTREMITY;  Surgeon: Conrad Salem, MD;  Location: [place] CV LAB;  Service: Cardiovascular;  Laterality: N/A;  . ARTERIOVENOUS GRAFT PLACEMENT Left 2010   "forearm"  . BALLOON DILATION N/A 08/17/2016   Procedure: POSSIBLE BALLOON DILATION POSSIBLE MALONEY;  Surgeon: Gatha Mayer, MD;  Location: [facility] ENDOSCOPY;  Service: Endoscopy;  Laterality: N/A;  . CARDIAC CATHETERIZATION    . CARDIAC CATHETERIZATION N/A 10/27/2014   Procedure: Left Heart Cath and Coronary Angiography;  Surgeon: Leonie Man, MD;  Location: [place] CV LAB;  Service: Cardiovascular;  Laterality: N/A;  . CARDIAC CATHETERIZATION N/A 10/28/2014   Procedure: Coronary Stent Intervention;  Surgeon: Peter M Martinique, MD;  Location: [place] CV LAB;  Service: Cardiovascular;  Laterality: N/A;  . CARDIAC CATHETERIZATION Bilateral 12/2008   R. heart cath showed elevated left and right heart filling pressures w/ pulmonary artery pressure elevated mildly out of proportion to the wedge. The left heart  cath showed diffuse distal vessel disease as well as a 75% stenosis in the mid circumflex w/ a 90% stenosis of the ostial first obtuse marginal. These lesions were in close proximity. there was a 60-70%mild RCA stenosis.   [name] CATARACT EXTRACTION W/ INTRAOCULAR LENS  IMPLANT, BILATERAL Bilateral   . COLON SURGERY    . COLONOSCOPY    . COLOSTOMY  1986  . ELECTROCARDIOGRAM  04-27-06  . ELECTROPHYSIOLOGIC STUDY N/A 11/17/2015   Procedure: A-Tach Ablation;  Surgeon: Will Meredith Leeds, MD;  Location: [place] CV LAB;  Service: Cardiovascular;  Laterality: N/A;  . ESOPHAGOGASTRODUODENOSCOPY  03-18-04  . ESOPHAGOGASTRODUODENOSCOPY N/A 02/02/2013   Procedure: ESOPHAGOGASTRODUODENOSCOPY (EGD);  Surgeon: Irene Shipper, MD;  Location[facility] ENDOSCOPY;  Service: Endoscopy;  Laterality: N/A;  . ESOPHAGOGASTRODUODENOSCOPY (EGD) WITH PROPOFOL N/A 08/17/2016   Procedure: ESOPHAGOGASTRODUODENOSCOPY (EGD) WITH PROPOFOL;  Surgeon: Gatha Mayer, MD;  Location: [facility];  Service: Endoscopy;  Laterality: N/A;   NO XRAY NEEDED  . EYE SURGERY    . FOOT AMPUTATION THROUGH METATARSAL  10-07-10   Right foot transmetatarsal  . INSERTION OF AHMED VALVE Right 08/20/2013   Procedure: INSERTION OF AHMED VALVE WITH Mitomycin C application;  Surgeon: Marylynn Pearson, MD;  Location: [place];  Service: Ophthalmology;  Laterality: Right;  . INSERTION OF AHMED VALVE Left 07/22/2014   Procedure: INSERTION OF AHMED VALVE WITH Jay;  Surgeon: Marylynn Pearson, MD;  Location: [place];  Service: Ophthalmology;  Laterality: Left;  . LEFT HEART CATH AND CORONARY ANGIOGRAPHY N/A 08/18/2016   Procedure: LEFT HEART CATH AND CORONARY ANGIOGRAPHY;  Surgeon: Martinique, Peter M, MD;  Location: [place] CV LAB;  Service: Cardiovascular;  Laterality: N/A;  . MITOMYCIN C APPLICATION Left 04/24/6061   Procedure: MITOMYCIN C APPLICATION;  Surgeon: Marylynn Pearson, MD;  Location: [place];  Service: Ophthalmology;  Laterality: Left;  . PARS PLANA VITRECTOMY   11/29/2011   Procedure: PARS PLANA VITRECTOMY WITH 23 GAUGE;  Surgeon: Adonis Brook, MD;  Location: [place];  Service: Ophthalmology;  Laterality: Right;  Right Eye 23 ga vitrectomy with membrane peel  . PARS PLANA VITRECTOMY Left 02/28/2012   Procedure: PARS PLANA VITRECTOMY WITH 23 GAUGE;  Surgeon: Adonis Brook, MD;  Location: [place];  Service: Ophthalmology;  Laterality: Left;  . REVISION UROSTOMY CUTANEOUS    . SUPRAVENTRICULAR TACHYCARDIA ABLATION  11/17/2015  . VAGINAL HYSTERECTOMY     Family History  Problem Relation Age of Onset  . Hypertension Mother   . Coronary artery disease Mother   . Hypertension Father   . Diabetes Father   . Cancer Sister        colon  . Hypertension Other    Social History:  reports that  has never smoked. she has never used smokeless tobacco. She reports that she does not drink alcohol or use drugs. Allergies  Allergen Reactions  . Ace Inhibitors Cough  . Eggs Or Egg-Derived Products Nausea And Vomiting  . Enalapril Cough  . Lisinopril Cough  . Omnipaque [Iohexol] Hives   Prior to Admission  medications   Medication Sig Start Date End Date Taking? Authorizing Provider  acetaminophen (TYLENOL) 500 MG tablet Take 500-1,000 mg by mouth every 6 (six) hours as needed (pain).    Yes [provider]  aspirin 81 MG tablet Take 1 tablet (81 mg total) by mouth daily. 10/29/14  Yes Dhungel, Nishant, MD  calcium acetate (PHOSLO) 667 MG capsule Take 667-2,668 mg by mouth See admin instructions. Take 4 capsules (2668 mg) by mouth three times daily with meals and 1 capsule (667 mg) with snacks   Yes [provider]  Calcium Carbonate Antacid (ALKA-SELTZER ANTACID PO) Take 2 tablets by mouth daily as needed (for indigestion).    Yes [provider]  dorzolamide-timolol (COSOPT) 22.3-6.8 MG/ML ophthalmic solution Place 1 drop into the left eye daily.  11/09/15  Yes [provider]  metoprolol tartrate (LOPRESSOR) 50 MG tablet Take 1  tablet (50 mg total) by mouth every Tuesday, Thursday, Saturday, and Sunday. Patient taking differently: Take 50 mg by mouth See admin instructions. Take one tablet (50 mg) by mouth on Sunday, Tuesday, Thursday, Saturday mornings. (non-dialysis days) 08/20/16  Yes Allie Bossier, MD  oxycodone (OXY-IR) 5 MG capsule Take 1 capsule (5 mg total) by mouth every 6 (six) hours as needed for pain. 01/25/17  Yes Plotnikov, Evie Lacks, MD  simvastatin (ZOCOR) 10 MG tablet Take 1 tablet (10 mg total) by mouth every evening. Patient taking differently: Take 10 mg by mouth at bedtime.  05/23/16  Yes Camnitz, Will Hassell Done, MD  glucose blood (ONE TOUCH ULTRA TEST) test strip 1 each by Other route daily. And lancets 1/day 03/16/16   Renato Shin, MD  lidocaine-prilocaine (EMLA) cream Apply 1 application topically as needed (topical anesthesia for hemodialysis if Gebauers and Lidocaine injection are ineffective.). Patient not taking: Reported on 01/27/2017 08/20/16   Allie Bossier, MD  nitroGLYCERIN (NITROSTAT) 0.4 MG SL tablet Place 1 tablet (0.4 mg total) under the tongue every 5 (five) minutes as needed for chest pain. Patient not taking: Reported on 01/23/2017 10/29/14   Dhungel, Flonnie Overman, MD  ondansetron (ZOFRAN) 4 MG tablet Take 1 tablet (4 mg total) by mouth every 6 (six) hours as needed for nausea. Patient not taking: Reported on 01/23/2017 01/13/17   Edwin Dada, MD   Current Facility-Administered Medications  Medication Dose Route Frequency Provider Last Rate Last Dose  . acetaminophen (TYLENOL) tablet 650 mg  650 mg Oral Q6H PRN Radene Gunning, NP       Or  . acetaminophen (TYLENOL) suppository 650 mg  650 mg Rectal Q6H PRN Black, Lezlie Octave, NP      . albuterol (PROVENTIL) (2.5 MG/3ML) 0.083% nebulizer solution 2.5 mg  2.5 mg Inhalation Q6H PRN Radene Gunning, NP      . aspirin EC tablet 81 mg  81 mg Oral Q T,Th,S,Su Black, Karen M, NP   81 mg at 01/28/17 0925  . calcium acetate (PHOSLO) capsule 2,001  mg  2,001 mg Oral TID WC Radene Gunning, NP   2,001 mg at 01/28/17 0925  . calcium acetate (PHOSLO) capsule 667 mg  667 mg Oral With snacks Phillips Grout, MD   667 mg at 01/28/17 2123  . dorzolamide-timolol (COSOPT) 22.3-6.8 MG/ML ophthalmic solution 1 drop  1 drop Left Eye Daily Radene Gunning, NP   1 drop at 01/28/17 1850  . heparin injection 5,000 Units  5,000 Units Subcutaneous Q8H Radene Gunning, NP   5,000 Units at 01/29/17 1884  .  insulin aspart (novoLOG) injection 0-5 Units  0-5 Units Subcutaneous QHS Radene Gunning, NP   2 Units at 01/28/17 0034  . insulin aspart (novoLOG) injection 0-9 Units  0-9 Units Subcutaneous TID WC Radene Gunning, NP   2 Units at 01/28/17 1730  . metoprolol tartrate (LOPRESSOR) tablet 12.5 mg  12.5 mg Oral BID Rosita Fire, MD   12.5 mg at 01/28/17 2115  . ondansetron (ZOFRAN) tablet 4 mg  4 mg Oral Q6H PRN Radene Gunning, NP       Or  . ondansetron [facility]) injection 4 mg  4 mg Intravenous Q6H PRN Radene Gunning, NP      . oxyCODONE (Oxy IR/ROXICODONE) immediate release tablet 5 mg  5 mg Oral Q6H PRN Radene Gunning, NP      . piperacillin-tazobactam (ZOSYN) IVPB 3.375 g  3.375 g Intravenous Q12H Jalene Mullet, RPH   Stopped at 01/29/17 0115  . senna-docusate (Senokot-S) tablet 1 tablet  1 tablet Oral QHS PRN Radene Gunning, NP      . simvastatin (ZOCOR) tablet 10 mg  10 mg Oral Daily Radene Gunning, NP   10 mg at 01/28/17 0926  . vancomycin (VANCOCIN) IVPB 750 mg/150 ml premix  750 mg Intravenous Q M,W,F-HD Charlton Haws, RPH       Facility-Administered Medications Ordered in Other Encounters  Medication Dose Route Frequency Provider Last Rate Last Dose  . mitoMYcin (MUTAMYCIN) Injection Use in OR only (0.4 mg/ml)  0.5 mL Left Eye Once Marylynn Pearson, MD       Labs: Basic Metabolic Panel: Recent Labs  Lab 01/27/17 1233 01/27/17 1654 01/28/17 0649 01/29/17 0634  NA 138  --  138 139  K 3.9  --  4.2 3.9  CL 95*  --  97* 99*  CO2 23  --   22 21*  GLUCOSE 185*  --  245* 113*  BUN 18  --  22* 24*  CREATININE 3.80* 3.53* 4.00* 4.50*  CALCIUM 8.0*  --  8.0* 8.4*  PHOS  --   --   --  4.7*   Liver Function Tests: Recent Labs  Lab 01/22/17 1238 01/27/17 1233 01/29/17 0634  AST 17 18  --   ALT 11* 10*  --   ALKPHOS 158* 141*  --   BILITOT 0.9 0.9  --   PROT 7.3 6.8  --   ALBUMIN 2.6* 2.7* 2.4*   CBC: Recent Labs  Lab 01/22/17 1238  01/27/17 1233 01/28/17 0649 01/29/17 0634  WBC 9.6  --  8.8 6.0 7.3  NEUTROABS 7.9*  --  7.6  --   --   HGB 10.9*   < > 10.6* 10.3* 10.5*  HCT 34.4*   < > 33.7* 33.1* 32.5*  MCV 83.7  --  84.5 84.4 83.5  PLT 309  --  251 238 234   < > = values in this interval not displayed.   Cardiac Enzymes: Recent Labs  Lab 01/27/17 1654 01/27/17 2308 01/28/17 0649  TROPONINI 0.08* 0.13* 0.15*   CBG: Recent Labs  Lab 01/28/17 0747 01/28/17 1151 01/28/17 1627 01/28/17 2304 01/29/17 0758  GLUCAP 237* 220* 173* 166* 94   Studies/Results: Ct Angio Chest Pe W/cm &/or Wo Cm  Result Date: 01/27/2017 CLINICAL DATA:  Shortness of breath EXAM: CT ANGIOGRAPHY CHEST WITH CONTRAST TECHNIQUE: Multidetector CT imaging of the chest was performed using the standard protocol during bolus administration of intravenous contrast. Multiplanar CT image reconstructions and MIPs  were obtained to evaluate the vascular anatomy. CONTRAST:  162mL ISOVUE-370 IOPAMIDOL (ISOVUE-370) INJECTION 76% COMPARISON:  Chest radiograph 01/27/2017 FINDINGS: Cardiovascular: Contrast injection is sufficient to demonstrate satisfactory opacification of the pulmonary arteries to the segmental level. There is no pulmonary embolus. The main pulmonary artery is within normal limits for size. There is no CT evidence of acute right heart strain. There is moderate calcific aortic atherosclerosis. There is also coronary artery calcification. There is a normal 3-vessel arch branching pattern. Heart size is enlarged. No pericardial effusion.  Mediastinum/Nodes: No mediastinal, hilar or axillary lymphadenopathy. The visualized thyroid and thoracic esophageal course are unremarkable. Lungs/Pleura: Small pleural effusions, right greater than left, with associated atelectasis. No focal consolidation otherwise. No pulmonary nodules or masses. Upper Abdomen: Contrast bolus timing is not optimized for evaluation of the abdominal organs. 4 x 3 cm hypoattenuating focus in the spleen. Otherwise unremarkable visualized upper abdominal structures. Musculoskeletal: Degenerative disc disease without spinal canal stenosis. Review of the MIP images confirms the above findings. IMPRESSION: 1. No pulmonary embolus. 2. Small pleural effusions, right greater than left. 3. Cardiomegaly, coronary artery atherosclerosis and aortic atherosclerosis (ICD10-I70.0). 4. Hypoattenuating lesion of the spleen, incompletely visualized and indeterminate on this arterial phase study. This is probably benign and could be further characterized with nonemergent CT of the abdomen and pelvis on a nonemergent basis. Electronically Signed   By: Ulyses Jarred M.D.   On: 01/27/2017 23:03   Dg Chest Port 1 View  Result Date: 01/27/2017 CLINICAL DATA:  Hypoxia. EXAM: PORTABLE CHEST 1 VIEW COMPARISON:  01/22/2017 FINDINGS: The cardiac silhouette is enlarged, similar to the prior radiograph. Mediastinal contours appear intact. Calcific atherosclerotic disease of the aorta. Deviation of the proximal trachea to the right, with fullness in the left superior hemithorax. There is no evidence of focal airspace consolidation, pleural effusion or pneumothorax. Osseous structures are without acute abnormality. Soft tissues are grossly normal. IMPRESSION: Enlargement of the cardiac silhouette, which may be due to cardiomegaly or pericardial effusion. Calcific atherosclerotic disease of the aorta. Deviation of the proximal trachea to the right with apparent fullness in the left superior hemithorax. This may  represent a thyroid or mediastinal mass displacing the trachea, or potentially subpleural pulmonary nodule. Further evaluation with contrast-enhanced chest CT may be considered. Electronically Signed   By: Fidela Salisbury M.D.   On: 01/27/2017 12:47    ROS: All others negative except those listed in HPI.  Physical Exam: Vitals:   01/28/17 1000 01/28/17 1800 01/28/17 2146 01/29/17 0619  BP: (!) 115/55 (!) 150/70 (!) 104/52 (!) 105/47  Pulse: 76 80 77 73  Resp: 16 18 18 18   Temp: 98.2 F (36.8 C) 98 F (36.7 C) 97.6 F (36.4 C) 97.8 F (36.6 C)  TempSrc: Oral Oral Oral Oral  SpO2: 98% 98% 100% 100%  Weight:   80 kg (176 lb 6.4 oz)      General: NAD, WDWN, pleasant female Head: NCAT, sclera not icteric, MMM Neck: Supple. No lymphadenopathy. No JVD Lungs: CTA bilaterally. No wheeze, rales or rhonchi. Breathing is unlabored. Heart: RRR. +0/2 systolic murmur, no rubs or gallops.  Abdomen: soft, nontender, no guarding, no rebound tenderness. Urostomy and colostomy in place. Lower extremities: dry skin, no edema b/l, R TMA with dressing over plantar surface/heel.  Neuro: Alert and oriented to person and date. Moves all extremities spontaneously. Psych:  Responds to questions appropriately with a normal affect. Dialysis Access: LU AVF,   Dialysis Orders:  [place]WF - [place]  4hrs hrs,  BFR 400, DFR 800,  EDW 72.5kg, 3K/ 2Ca,   Access: LU AVF  Heparin NONE  mircera 46mcg IV q 2 wks - last 1/16 Calcitriol 38mcg PO qHD TIW  Assessment/Plan: 1.  Elevated lactic acid/confusion/hypotension-?Sepsis - Mild confusion noted on exam, unsure of baseline. Monitor. Lactic acid 4.0>6.2.  No PNA on CXR. UA negative. On ABX. Per primary 2. Nausea/Vomiting - improved 3. Acute Resp Failure w/hypoxia -improved. CT negative for PE. 4. ESRD - No emergent indication for dialysis. Will continue dialysis per regular MWF schedule.  5.  Hypotension/volume  - BP soft, antihypertensives held.  Adding midodrine to  facilitate dialysis. Volume appears stable. Needs standing weights if possible to better assess EDW. 6.  Anemia  - Hgb 10.5, ESA not indicated at this time.  7.  Secondary Hyperparathyroidism -  Ca 8.4, P 4.7, Continue VDRA and binders.  8.  Nutrition - Alb  9. Chronic foot ulcer - per primary 10. Chronic CHF - per primary 11. DMT2 - on insulin per primary.  12. Dispo - Palliative Care consult requested to help establish goals of care.   Jen Mow, PA-C [facility] Pager: [phone] 01/29/2017, 8:46 AM   Pt seen, examined and agree w A/P as above.  Kelly Splinter MD [employer] pager [phone]   01/30/2017, 8:51 AM

## 2017-01-29 NOTE — Progress Notes (Signed)
PROGRESS NOTE    [name]  OVF:[id number] DOB: 04/15/1945 DOA: 01/27/2017 PCP: Cassandria Anger, MD   Brief Narrative: 72 year old female with history of ESRD on hemodialysis Monday Wednesday Friday, cardiomyopathy, a stroke, chronic systolic CHF with EF of 84%, diabetes, colon cancer status post colostomy, PEA arrest during EGD in August 2018, recent hospitalization for suspected urosepsis, chronic right foot ulcer presented to the ER with nausea vomiting, hypotension to 60s.  In the ER, patient's blood pressure was systolic 166 and oxygen saturation 79% in room air.  Admitted for further evaluation.  Assessment & Plan:   #Elevated lactic acid, hypotension in the setting of nausea vomiting, concern for sepsis: Patient has chronic right foot ulcer and she is a hemodialysis patient via AV fistula.  She has a chronic elevation in troponin level.   -Repeat lactic acid level still elevated.  Clinically she feels better and cheerful at bedside today.  I will continue broad-spectrum antibiotics with IV vancomycin and Zosyn for now.  Repeat lab in the morning. -Chest x-ray with no pneumonia. -UA with UTI which which is contaminant.  Patient is ESRD with minimal urine output.    #Nausea vomiting, non-intractable: Patient reported tolerating diet well.  Abdomen exam benign.  She has a colostomy.  #Acute respiratory failure for with hypoxia on admission: CT scan of chest with no PE.  #ESRD on hemodialysis: Hemodialysis as per nephrology.  No urgent indication.  #Chronic systolic congestive heart failure: Continue home medication.  Blood pressure low today therefore discontinue metoprolol.  May need low-dose midodrine.  Patient has EF of 45%.  #History of coronary artery disease: No chest pain.  She has chronic elevation in troponin likely demand ischemia and due to ESRD.  #Chronic right foot ulcer: Continue local care.  #Type 2 diabetes with hyperglycemia: A1c 8.4.  Continue current  insulin regimen.  Monitor blood sugar level.  # Goals of care discussion: Patient with chronic congestive heart failure with EF of 25%, ESRD, chronic right foot ulcer with no improvement, now with elevated lactic acid level.  See as multiple hospitalization.  I discussed this with the patient and her daughter at bedside site regarding goals of care.  Has not decided about CODE STATUS.  Palliative consult requested.  Case manager following for safe discharge plan.  Discussed with the patient's daughter.  DVT prophylaxis: Heparin subcutaneous Code Status: Full code Family Communication: Discussed with the patient's daughter at bedside. Disposition Plan: Currently admitted    Consultants:   Palliative care  Procedures: None Antimicrobials: Vancomycin and Zosyn  Subjective: Seen and examined at bedside.  Denies headache, dizziness, nausea vomiting.  Reported tolerate diet well.  Daughter at bedside.  Denies chest pain or shortness of breath.  Objective: Vitals:   01/28/17 1800 01/28/17 2146 01/29/17 0619 01/29/17 0920  BP: (!) 150/70 (!) 104/52 (!) 105/47 (!) 87/43  Pulse: 80 77 73 75  Resp: 18 18 18 18   Temp: 98 F (36.7 C) 97.6 F (36.4 C) 97.8 F (36.6 C) 97.6 F (36.4 C)  TempSrc: Oral Oral Oral Oral  SpO2: 98% 100% 100% 92%  Weight:  80 kg (176 lb 6.4 oz)      Intake/Output Summary (Last 24 hours) at 01/29/2017 1328 Last data filed at 01/29/2017 0630 Gross per 24 hour  Intake 330 ml  Output 260 ml  Net 70 ml   Filed Weights   01/28/17 2146  Weight: 80 kg (176 lb 6.4 oz)    Examination:  General exam:  Sitting in bed, talking with her daughter and not in distress Respiratory system: Clear bilateral, respiratory effort normal Cardiovascular system: Regular rate rhythm S1-S2 normal.  No pedal edema.  Gastrointestinal system: Has colostomy.  Abdomen soft, nontender.  Unchanged Central nervous system: Alert awake and oriented.  No focal neurological  deficit. Extremities: Right foot has dressing applied.      Data Reviewed: I have personally reviewed following labs and imaging studies  CBC: Recent Labs  Lab 01/25/17 0757 01/27/17 1233 01/28/17 0649 01/29/17 0634  WBC  --  8.8 6.0 7.3  NEUTROABS  --  7.6  --   --   HGB 12.9 10.6* 10.3* 10.5*  HCT 38.0 33.7* 33.1* 32.5*  MCV  --  84.5 84.4 83.5  PLT  --  251 238 818   Basic Metabolic Panel: Recent Labs  Lab 01/25/17 0757 01/27/17 1233 01/27/17 1654 01/28/17 0649 01/29/17 0634  NA 139 138  --  138 139  K 3.9 3.9  --  4.2 3.9  CL 97* 95*  --  97* 99*  CO2  --  23  --  22 21*  GLUCOSE 97 185*  --  245* 113*  BUN 22* 18  --  22* 24*  CREATININE 4.10* 3.80* 3.53* 4.00* 4.50*  CALCIUM  --  8.0*  --  8.0* 8.4*  PHOS  --   --   --   --  4.7*   GFR: Estimated Creatinine Clearance: 10.7 mL/min (A) (by C-G formula based on SCr of 4.5 mg/dL (H)). Liver Function Tests: Recent Labs  Lab 01/27/17 1233 01/29/17 0634  AST 18  --   ALT 10*  --   ALKPHOS 141*  --   BILITOT 0.9  --   PROT 6.8  --   ALBUMIN 2.7* 2.4*   No results for input(s): LIPASE, AMYLASE in the last 168 hours. No results for input(s): AMMONIA in the last 168 hours. Coagulation Profile: No results for input(s): INR, PROTIME in the last 168 hours. Cardiac Enzymes: Recent Labs  Lab 01/27/17 1654 01/27/17 2308 01/28/17 0649  TROPONINI 0.08* 0.13* 0.15*   BNP (last 3 results) No results for input(s): PROBNP in the last 8760 hours. HbA1C: Recent Labs    01/27/17 1309  HGBA1C 8.4*   CBG: Recent Labs  Lab 01/28/17 1151 01/28/17 1627 01/28/17 2304 01/29/17 0758 01/29/17 1158  GLUCAP 220* 173* 166* 94 143*   Lipid Profile: No results for input(s): CHOL, HDL, LDLCALC, TRIG, CHOLHDL, LDLDIRECT in the last 72 hours. Thyroid Function Tests: Recent Labs    01/28/17 0016  TSH 0.957  FREET4 0.96   Anemia Panel: No results for input(s): VITAMINB12, FOLATE, FERRITIN, TIBC, IRON, RETICCTPCT  in the last 72 hours. Sepsis Labs: Recent Labs  Lab 01/27/17 1318 01/27/17 1659 01/27/17 2037 01/29/17 0827  LATICACIDVEN 6.06* 4.86* 4.0* 6.2*    Recent Results (from the past 240 hour(s))  Urine Culture     Status: Abnormal   Collection Time: 01/22/17  2:28 PM  Result Value Ref Range Status   Specimen Description URINE, RANDOM  Final   Special Requests NONE  Final   Culture MULTIPLE SPECIES PRESENT, SUGGEST RECOLLECTION (A)  Final   Report Status 01/23/2017 FINAL  Final  Blood Culture (routine x 2)     Status: None (Preliminary result)   Collection Time: 01/27/17 12:45 PM  Result Value Ref Range Status   Specimen Description BLOOD RIGHT ARM  Final   Special Requests IN PEDIATRIC BOTTLE Blood Culture  adequate volume  Final   Culture NO GROWTH < 24 HOURS  Final   Report Status PENDING  Incomplete  Blood Culture (routine x 2)     Status: None (Preliminary result)   Collection Time: 01/27/17  1:00 PM  Result Value Ref Range Status   Specimen Description BLOOD RIGHT HAND  Final   Special Requests   Final    BOTTLES DRAWN AEROBIC AND ANAEROBIC Blood Culture adequate volume   Culture NO GROWTH < 24 HOURS  Final   Report Status PENDING  Incomplete         Radiology Studies: Ct Angio Chest Pe W/cm &/or Wo Cm  Result Date: 01/27/2017 CLINICAL DATA:  Shortness of breath EXAM: CT ANGIOGRAPHY CHEST WITH CONTRAST TECHNIQUE: Multidetector CT imaging of the chest was performed using the standard protocol during bolus administration of intravenous contrast. Multiplanar CT image reconstructions and MIPs were obtained to evaluate the vascular anatomy. CONTRAST:  173mL ISOVUE-370 IOPAMIDOL (ISOVUE-370) INJECTION 76% COMPARISON:  Chest radiograph 01/27/2017 FINDINGS: Cardiovascular: Contrast injection is sufficient to demonstrate satisfactory opacification of the pulmonary arteries to the segmental level. There is no pulmonary embolus. The main pulmonary artery is within normal limits for  size. There is no CT evidence of acute right heart strain. There is moderate calcific aortic atherosclerosis. There is also coronary artery calcification. There is a normal 3-vessel arch branching pattern. Heart size is enlarged. No pericardial effusion. Mediastinum/Nodes: No mediastinal, hilar or axillary lymphadenopathy. The visualized thyroid and thoracic esophageal course are unremarkable. Lungs/Pleura: Small pleural effusions, right greater than left, with associated atelectasis. No focal consolidation otherwise. No pulmonary nodules or masses. Upper Abdomen: Contrast bolus timing is not optimized for evaluation of the abdominal organs. 4 x 3 cm hypoattenuating focus in the spleen. Otherwise unremarkable visualized upper abdominal structures. Musculoskeletal: Degenerative disc disease without spinal canal stenosis. Review of the MIP images confirms the above findings. IMPRESSION: 1. No pulmonary embolus. 2. Small pleural effusions, right greater than left. 3. Cardiomegaly, coronary artery atherosclerosis and aortic atherosclerosis (ICD10-I70.0). 4. Hypoattenuating lesion of the spleen, incompletely visualized and indeterminate on this arterial phase study. This is probably benign and could be further characterized with nonemergent CT of the abdomen and pelvis on a nonemergent basis. Electronically Signed   By: Ulyses Jarred M.D.   On: 01/27/2017 23:03        Scheduled Meds: . aspirin EC  81 mg Oral Q T,Th,S,Su  . calcium acetate  2,001 mg Oral TID WC  . dorzolamide-timolol  1 drop Left Eye Daily  . heparin  5,000 Units Subcutaneous Q8H  . insulin aspart  0-5 Units Subcutaneous QHS  . insulin aspart  0-9 Units Subcutaneous TID WC  . simvastatin  10 mg Oral Daily   Continuous Infusions: . piperacillin-tazobactam (ZOSYN)  IV 3.375 g (01/29/17 5038)  . vancomycin       LOS: 1 day    Jquan Egelston Tanna Furry, MD [facility] Pager [phone]  If 7PM-7AM, please contact  night-coverage [email or web address] Password [email or web address] 01/29/2017, 1:28 PM

## 2017-01-29 NOTE — Consult Note (Signed)
   [facility] CM Inpatient Consult   01/29/2017  [name] 04-Feb-1945 [id number]  Patient is currently active with [facility] for chronic disease management services.  Patient has been engaged by a [employer] and CSW.  Our community based plan of care has focused on disease management and community resource support.  Patient will receive a post discharge transition of care call and will be evaluated for monthly home visits for assessments and disease process education.  Met with the patient at the bedside.  She agrees to ongoing follow up with [facility]. Patient states she is awaiting her dialysis for today her treatment days are MWF.  States, "I don't miss my dialysis." [facility] will continue to follow for community needs.  Of note, [facility] Care Management services does not replace or interfere with any services that are needed or arranged by inpatient case management or social work.  For additional questions or referrals please contact:  Natividad Brood, RN BSN [facility] Liaison  [phone] business mobile phone Toll free office [phone]

## 2017-01-29 NOTE — Progress Notes (Signed)
[facility]  Patient Status: Active (receiving services up to time of hospitalization)  [facility] is providing the following services: PT  If patient discharges after hours, please call [phone].   [name] 01/29/2017, 2:31 PM

## 2017-01-30 ENCOUNTER — Other Ambulatory Visit: Payer: Self-pay

## 2017-01-30 ENCOUNTER — Telehealth: Payer: Self-pay | Admitting: Internal Medicine

## 2017-01-30 ENCOUNTER — Ambulatory Visit: Payer: Medicare Other | Admitting: Internal Medicine

## 2017-01-30 ENCOUNTER — Ambulatory Visit: Payer: Self-pay | Admitting: *Deleted

## 2017-01-30 DIAGNOSIS — Z66 Do not resuscitate: Secondary | ICD-10-CM

## 2017-01-30 DIAGNOSIS — Z515 Encounter for palliative care: Secondary | ICD-10-CM

## 2017-01-30 DIAGNOSIS — R627 Adult failure to thrive: Secondary | ICD-10-CM

## 2017-01-30 DIAGNOSIS — R1909 Other intra-abdominal and pelvic swelling, mass and lump: Secondary | ICD-10-CM

## 2017-01-30 LAB — CBC
HEMATOCRIT: 37.8 % (ref 36.0–46.0)
Hemoglobin: 11.9 g/dL — ABNORMAL LOW (ref 12.0–15.0)
MCH: 26.6 pg (ref 26.0–34.0)
MCHC: 31.5 g/dL (ref 30.0–36.0)
MCV: 84.6 fL (ref 78.0–100.0)
PLATELETS: 276 10*3/uL (ref 150–400)
RBC: 4.47 MIL/uL (ref 3.87–5.11)
RDW: 19 % — AB (ref 11.5–15.5)
WBC: 8.8 10*3/uL (ref 4.0–10.5)

## 2017-01-30 LAB — GLUCOSE, CAPILLARY
GLUCOSE-CAPILLARY: 72 mg/dL (ref 65–99)
GLUCOSE-CAPILLARY: 70 mg/dL (ref 65–99)
GLUCOSE-CAPILLARY: 95 mg/dL (ref 65–99)
Glucose-Capillary: 54 mg/dL — ABNORMAL LOW (ref 65–99)

## 2017-01-30 LAB — LACTIC ACID, PLASMA: Lactic Acid, Venous: 7.7 mmol/L (ref 0.5–1.9)

## 2017-01-30 LAB — HEPATITIS B SURFACE ANTIGEN: Hepatitis B Surface Ag: NEGATIVE

## 2017-01-30 MED ORDER — HEPARIN SODIUM (PORCINE) 1000 UNIT/ML DIALYSIS: 1000.0000 [IU] | INTRAMUSCULAR | Status: DC | PRN

## 2017-01-30 MED ORDER — VANCOMYCIN HCL IN DEXTROSE 750-5 MG/150ML-% IV SOLN
750.0000 mg | INTRAVENOUS | Status: AC
  Administered 2017-01-30: 750 mg via INTRAVENOUS
  Filled 2017-01-30: qty 150

## 2017-01-30 MED ORDER — ONDANSETRON HCL 4 MG/2ML IJ SOLN
INTRAMUSCULAR | Status: AC
  Filled 2017-01-30: qty 2

## 2017-01-30 MED ORDER — MIDODRINE HCL 5 MG PO TABS
10.0000 mg | ORAL_TABLET | Freq: Two times a day (BID) | ORAL | Status: DC
  Administered 2017-01-30 – 2017-01-31 (×4): 10 mg via ORAL
  Filled 2017-01-30 (×3): qty 2

## 2017-01-30 MED ORDER — ALTEPLASE 2 MG IJ SOLR: 2.0000 mg | Freq: Once | INTRAMUSCULAR | Status: DC | PRN

## 2017-01-30 MED ORDER — MORPHINE SULFATE (CONCENTRATE) 10 MG/0.5ML PO SOLN: 5.0000 mg | ORAL | Status: DC | PRN

## 2017-01-30 MED ORDER — LIDOCAINE-PRILOCAINE 2.5-2.5 % EX CREA: 1.0000 "application " | TOPICAL_CREAM | CUTANEOUS | Status: DC | PRN

## 2017-01-30 MED ORDER — SODIUM CHLORIDE 0.9 % IV SOLN: 100.0000 mL | INTRAVENOUS | Status: DC | PRN

## 2017-01-30 MED ORDER — MIDODRINE HCL 5 MG PO TABS
ORAL_TABLET | ORAL | Status: AC
  Filled 2017-01-30: qty 2

## 2017-01-30 MED ORDER — PIPERACILLIN-TAZOBACTAM 3.375 G IVPB
3.3750 g | Freq: Two times a day (BID) | INTRAVENOUS | Status: DC
  Administered 2017-01-30 – 2017-01-31 (×2): 3.375 g via INTRAVENOUS
  Filled 2017-01-30 (×3): qty 50

## 2017-01-30 MED ORDER — LORAZEPAM 1 MG PO TABS
1.0000 mg | ORAL_TABLET | Freq: Four times a day (QID) | ORAL | Status: DC | PRN
  Administered 2017-01-30: 1 mg via ORAL
  Filled 2017-01-30: qty 1

## 2017-01-30 MED ORDER — LIDOCAINE HCL (PF) 1 % IJ SOLN: 5.0000 mL | INTRAMUSCULAR | Status: DC | PRN

## 2017-01-30 MED ORDER — VANCOMYCIN HCL IN DEXTROSE 750-5 MG/150ML-% IV SOLN
INTRAVENOUS | Status: AC
  Filled 2017-01-30: qty 150

## 2017-01-30 MED ORDER — PENTAFLUOROPROP-TETRAFLUOROETH EX AERO: 1.0000 "application " | INHALATION_SPRAY | CUTANEOUS | Status: DC | PRN

## 2017-01-30 MED ORDER — SODIUM CHLORIDE 0.9 % IV BOLUS (SEPSIS)
500.0000 mL | Freq: Once | INTRAVENOUS | Status: AC
  Administered 2017-01-30: 500 mL via INTRAVENOUS

## 2017-01-30 MED ORDER — MIDODRINE HCL 5 MG PO TABS: 10.0000 mg | ORAL_TABLET | Freq: Two times a day (BID) | ORAL | Status: DC

## 2017-01-30 NOTE — Progress Notes (Signed)
OT Cancellation Note  Patient Details Name: [name] MRN: [id number] DOB: Feb 06, 1945   Cancelled Treatment:    Reason Eval/Treat Not Completed: Patient at procedure or test/ unavailable. Attempted again this pm. Palliative meeting with pt. Will attempt to see pt tomorrow.   [place], OT/L  [phone] 01/30/2017 01/30/2017, 2:26 PM

## 2017-01-30 NOTE — Consult Note (Signed)
Consultation Note Date: 01/30/2017   Patient Name: [name]  DOB: 1945-02-10  MRN: [id number]  Age / Sex: 72 y.o., female  PCP: Cassandria Anger, MD Referring Physician: Rosita Fire, MD  Reason for Consultation: Establishing goals of care and Psychosocial/spiritual support  HPI/Patient Profile: 72 y.o. female admitted on 01/27/2017 with past medical history significant for end-stage renal disease Monday Wednesday Friday dialysis, CHF, cardiomyopathy, stroke, CHF with an EF of 25%, diabetes, colon cancer status post colostomy, PA arrest during EGD in August 2018 recent hospitalization for suspected urosepsis, chronic right ulcer presents to the emergency department from home with chief complaint hypotension and hypoxia.  Patient has had multiple readmissions in the last 6 months with a reported continued physical, functional, and cognitive decline.  Care in the home is much more difficult and complicated by continued decline and multiple co-morbidities.  She is high risk for decompensation regardless of current medical interventions.  She lives at home with her daughter who is her main caregiver and some out-of-pocket CNA support.  Patient and family face treatment option decisions, advanced directive decisions, end-of-life wishes and anticipatory care needs   Clinical Assessment and Goals of Care:  This NP Wadie Lessen reviewed medical records, received report from team, assessed the patient and then meet at the patient's bedside along with her daughter / Ms Troxler/main care giver to discuss diagnosis, prognosis, GOC, EOL wishes disposition and options.  Both patient and her daughter verbalized an understanding of the overall poor prognosis.  Patient states that she is at peace with her God and ready to die.  Daughter verbalizes her hope that her mother does not suffer any more.   They  hope for comfort, quality, and dignity.  Shift to a full comfort path, foregoing dialysis made.   Concept of Hospice and Palliative Care were discussed  A detailed discussion was had today regarding advanced directives.  Concepts specific to code status, artifical feeding and hydration, continued IV antibiotics and rehospitalization was had.  The difference between a aggressive medical intervention path  and a palliative comfort care path for this patient at this time was had.  Values and goals of care important to patient and family were attempted to be elicited.   Natural trajectory and expectations at EOL were discussed.  Questions and concerns addressed.   Family encouraged to call with questions or concerns.    PMT will continue to support holistically.    HCPOA/daughter    SUMMARY OF RECOMMENDATIONS    Code Status/Advance Care Planning:  DNR-documented today   Symptom Management:   Pain: Continue oxycodone 5 mg p.o. every 6 hours as needed  Palliative Prophylaxis:   Aspiration, Bowel Regimen, Frequent Pain Assessment, Oral Care and Palliative Wound Care  Additional Recommendations (Limitations, Scope, Preferences):  Full Comfort Care   No dialysis, diagnostics, re-hospitalizations, no artifical hydration  Begin to minimize oral medications, family request oral antibiotics on discharge  Home with hospice   Psycho-social/Spiritual:   Desire for further Chaplaincy support:no  Additional  Recommendations: Education on Hospice  Prognosis:   Less that 2 weeks  Discharge Planning:   Home with hospice     Primary Diagnoses: Present on Admission: . Anemia . Chronic systolic CHF (congestive heart failure) ([place]) . Coronary artery disease . Foot ulcer ([place]) . Ischemic cardiomyopathy . Obesity . Elevated lactic acid level . Hypotension . Deviated trachea   I have reviewed the medical record, interviewed the patient and family, and examined the patient. The  following aspects are pertinent.  Past Medical History:  Diagnosis Date  . Acute respiratory failure with hypoxia ([place]) 10/23/2014  . Adenomatous duodenal polyp   . Anemia   . Arthritis    "hands" (11/17/2015)  . Atrial tachycardia ([place])    on amiod  . Cardiomyopathy- mixed   . CHF (congestive heart failure) ([place])   . Chronic diastolic heart failure ([place])   . Colon cancer ([place]) 1986  . Colostomy in place [facility])   . Coronary artery disease    Previously decreased EF; echo 113 normal LV function  . Deviated trachea   . Dialysis patient ([place])   . ESRD (end stage renal disease) on dialysis [facility])    Dr Dunham/Dr. Lyda Kalata.  M, W, Fr; [place] (11/17/2015)  . GERD (gastroesophageal reflux disease)   . Glaucoma   . Heart murmur   . Hernia, incisional    abd  . Hyperlipidemia   . Hypertension   . Hypertensive heart disease    sees Dr. Alain Marion  . LBBB (left bundle branch block)   . Mitral valve insufficiency and aortic valve insufficiency   . Obesity   . Stroke [facility])    2011/12  . Type II diabetes mellitus ([place])    Social History   Socioeconomic History  . Marital status: Widowed    Spouse name: None  . Number of children: None  . Years of education: None  . Highest education level: None  Social Needs  . Financial resource strain: None  . Food insecurity - worry: None  . Food insecurity - inability: None  . Transportation needs - medical: None  . Transportation needs - non-medical: None  Occupational History  . None  Tobacco Use  . Smoking status: Never Smoker  . Smokeless tobacco: Never Used  Substance and Sexual Activity  . Alcohol use: No  . Drug use: No  . Sexual activity: No  Other Topics Concern  . None  Social History Narrative  . None   Family History  Problem Relation Age of Onset  . Hypertension Mother   . Coronary artery disease Mother   . Hypertension Father   . Diabetes Father   . Cancer Sister        colon  . Hypertension Other     Scheduled Meds: . aspirin EC  81 mg Oral Q T,Th,S,Su  . calcium acetate  2,001 mg Oral TID WC  . dorzolamide-timolol  1 drop Left Eye Daily  . heparin  5,000 Units Subcutaneous Q8H  . insulin aspart  0-5 Units Subcutaneous QHS  . insulin aspart  0-9 Units Subcutaneous TID WC  . midodrine  10 mg Oral BID WC  . simvastatin  10 mg Oral Daily   Continuous Infusions: . piperacillin-tazobactam (ZOSYN)  IV Stopped (01/30/17 0230)  . Vancomycin    . vancomycin Stopped (01/30/17 0802)   PRN Meds:.acetaminophen **OR** acetaminophen, albuterol, calcitRIOL, calcium acetate, ondansetron **OR** ondansetron (ZOFRAN) IV, oxyCODONE, senna-docusate Medications Prior to Admission:  Prior to Admission medications  Medication Sig Start Date End Date Taking? Authorizing Provider  acetaminophen (TYLENOL) 500 MG tablet Take 500-1,000 mg by mouth every 6 (six) hours as needed (pain).    Yes [provider]  aspirin 81 MG tablet Take 1 tablet (81 mg total) by mouth daily. 10/29/14  Yes Dhungel, Nishant, MD  calcium acetate (PHOSLO) 667 MG capsule Take 667-2,668 mg by mouth See admin instructions. Take 4 capsules (2668 mg) by mouth three times daily with meals and 1 capsule (667 mg) with snacks   Yes [provider]  Calcium Carbonate Antacid (ALKA-SELTZER ANTACID PO) Take 2 tablets by mouth daily as needed (for indigestion).    Yes [provider]  dorzolamide-timolol (COSOPT) 22.3-6.8 MG/ML ophthalmic solution Place 1 drop into the left eye daily.  11/09/15  Yes [provider]  metoprolol tartrate (LOPRESSOR) 50 MG tablet Take 1 tablet (50 mg total) by mouth every Tuesday, Thursday, Saturday, and Sunday. Patient taking differently: Take 50 mg by mouth See admin instructions. Take one tablet (50 mg) by mouth on Sunday, Tuesday, Thursday, Saturday mornings. (non-dialysis days) 08/20/16  Yes Allie Bossier, MD  oxycodone (OXY-IR) 5 MG capsule Take 1 capsule (5 mg total) by  mouth every 6 (six) hours as needed for pain. 01/25/17  Yes Plotnikov, Evie Lacks, MD  simvastatin (ZOCOR) 10 MG tablet Take 1 tablet (10 mg total) by mouth every evening. Patient taking differently: Take 10 mg by mouth at bedtime.  05/23/16  Yes Camnitz, Will Hassell Done, MD  glucose blood (ONE TOUCH ULTRA TEST) test strip 1 each by Other route daily. And lancets 1/day 03/16/16   Renato Shin, MD  lidocaine-prilocaine (EMLA) cream Apply 1 application topically as needed (topical anesthesia for hemodialysis if Gebauers and Lidocaine injection are ineffective.). Patient not taking: Reported on 01/27/2017 08/20/16   Allie Bossier, MD  nitroGLYCERIN (NITROSTAT) 0.4 MG SL tablet Place 1 tablet (0.4 mg total) under the tongue every 5 (five) minutes as needed for chest pain. Patient not taking: Reported on 01/23/2017 10/29/14   Dhungel, Flonnie Overman, MD  ondansetron (ZOFRAN) 4 MG tablet Take 1 tablet (4 mg total) by mouth every 6 (six) hours as needed for nausea. Patient not taking: Reported on 01/23/2017 01/13/17   Edwin Dada, MD   Allergies  Allergen Reactions  . Ace Inhibitors Cough  . Eggs Or Egg-Derived Products Nausea And Vomiting  . Enalapril Cough  . Lisinopril Cough  . Omnipaque [Iohexol] Hives   Review of Systems  Constitutional: Positive for fatigue.  Neurological: Positive for weakness.    Physical Exam  Constitutional: She is oriented to person, place, and time. She appears well-developed.  - generalized weaknesd and muscle atrophy  Cardiovascular: Normal rate, regular rhythm and normal heart sounds.  Neurological: She is alert and oriented to person, place, and time.  Skin: Skin is warm and dry.    Vital Signs: BP (!) 103/40 (BP Location: Right Arm)   Pulse 72   Temp 97.9 F (36.6 C) (Oral)   Resp 18   Wt 78 kg (171 lb 15.3 oz)   SpO2 98%   BMI 33.58 kg/m  Pain Assessment: No/denies pain   Pain Score: 0-No pain   SpO2: SpO2: 98 % O2 Device:SpO2: 98 % O2 Flow Rate:  .O2 Flow Rate (L/min): 4 L/min  IO: Intake/output summary:   Intake/Output Summary (Last 24 hours) at 01/30/2017 1359 Last data filed at 01/30/2017 1230 Gross per 24 hour  Intake 100 ml  Output 1050 ml  Net -950 ml    LBM: Last BM Date: 01/29/17 Baseline Weight: Weight: 80 kg (176 lb 6.4 oz) Most recent weight: Weight: 78 kg (171 lb 15.3 oz)     Palliative Assessment/Data: 30 % at best   Discussed with Dr Virgel Gess   Time In: 1345 Time Out: 1500 Time Total: 75 minutes  Greater than 50%  of this time was spent counseling and coordinating care related to the above assessment and plan.  Signed by: Wadie Lessen, NP   Please contact Palliative Medicine Team phone at [phone] for questions and concerns.  For individual provider: See Shea Evans

## 2017-01-30 NOTE — Care Management (Signed)
Hospice referral made to [facility] (choice of daughter/pt).  Agency to follow back up with CM.  Family requesting hospital bed and bedside commode.  Tentative plan for discharge 1/23 after HD

## 2017-01-30 NOTE — Progress Notes (Signed)
PROGRESS NOTE    [name]  GBT:[id number] DOB: Mar 23, 1945 DOA: 01/27/2017 PCP: Cassandria Anger, MD   Brief Narrative: 72 year old female with history of ESRD on hemodialysis Monday Wednesday Friday, cardiomyopathy, a stroke, chronic systolic CHF with EF of 71%, diabetes, colon cancer status post colostomy, PEA arrest during EGD in August 2018, recent hospitalization for suspected urosepsis, chronic right foot ulcer presented to the ER with nausea vomiting, hypotension to 60s.  In the ER, patient's blood pressure was systolic 062 and oxygen saturation 79% in room air.  Admitted for further evaluation.  Assessment & Plan:   #Elevated lactic acid, hypotension in the setting of nausea vomiting, concern for sepsis: Patient has chronic right foot ulcer and she is a hemodialysis patient via AV fistula.  She has a chronic elevation in troponin level.   -Repeat lactic acid level is a still elevated.  Clinically patient looks stable, able to tolerate diet although she was reporting vomiting.  She does not clinically look septic patient.  I will continue broad-spectrum antibiotics for now.  Continue to wait for the final culture result.  Repeat lactic acid level in the morning to trend.  Abdomen exam is benign as well.  -Chest x-ray with no pneumonia. -UA with UTI which which is contaminant.  Patient is ESRD with minimal urine output.    #Nausea vomiting, non-intractable: Had episode of vomiting but currently reporting well.  No nausea or vomiting.  Abdominal exam is nontender and benign.  She has colostomy.    #Acute respiratory failure for with hypoxia on admission: Improved.  CT scan of chest with no PE.  #ESRD on hemodialysis: Hemodialysis as per nephrology.  Dialysis today.  #Chronic systolic congestive heart failure: Continue home medication.  Blood pressure low today therefore discontinue metoprolol.  Continue midodrine for hypotension.  Patient has EF of 45%.  #History of coronary  artery disease: No chest pain.  She has chronic elevation in troponin likely demand ischemia and due to ESRD.  #Chronic right foot ulcer: Continue local care.  #Type 2 diabetes with hyperglycemia: A1c 8.4.  Continue current insulin regimen.  Monitor blood sugar level.  # Goals of care discussion: Patient with chronic congestive heart failure with EF of 25%, ESRD, chronic right foot ulcer with no improvement, now with elevated lactic acid level.  She has multiple hospitalization.  I discussed this with the patient and her daughter at bedside site regarding goals of care.  Has not decided about CODE STATUS.  Pending palliative care consult evaluation.  Case manager following for safe discharge plan.  Discussed with the patient's daughter.  DVT prophylaxis: Heparin subcutaneous Code Status: Full code Family Communication: Discussed with the patient's daughter at bedside yesterday. Disposition Plan: Currently admitted    Consultants:   Palliative care  Nephrology  Procedures: None Antimicrobials: Vancomycin and Zosyn  Subjective: Seen and examined at hemodialysis.  Reported an episode of vomiting yesterday but currently feels fine.  No nausea vomiting, chest pain or shortness of breath.  No abdominal pain. Objective: Vitals:   01/30/17 0900 01/30/17 0930 01/30/17 1000 01/30/17 1030  BP: (!) 85/41 (!) 98/49 (!) 91/45 (!) 107/55  Pulse: 80 86 82 82  Resp:  18  16  Temp:      TempSrc:      SpO2:      Weight:        Intake/Output Summary (Last 24 hours) at 01/30/2017 1136 Last data filed at 01/30/2017 0600 Gross per 24 hour  Intake 100  ml  Output 50 ml  Net 50 ml   Filed Weights   01/28/17 2146 01/30/17 0644 01/30/17 0825  Weight: 80 kg (176 lb 6.4 oz) 80.1 kg (176 lb 10.1 oz) 79 kg (174 lb 2.6 oz)    Examination:  General exam: Not in distress Respiratory system: Clear bilateral, respiratory for normal Cardiovascular system: Regular rate rhythm S1-S2 normal.  No pedal  edema.  Gastrointestinal system: Has colostomy with formed stool.  Abdomen soft, bowel sounds positive and not tender. Central nervous system: Alert awake and following commands. Extremities: Right foot has dressing applied.      Data Reviewed: I have personally reviewed following labs and imaging studies  CBC: Recent Labs  Lab 01/25/17 0757 01/27/17 1233 01/28/17 0649 01/29/17 0634 01/30/17 0409  WBC  --  8.8 6.0 7.3 8.8  NEUTROABS  --  7.6  --   --   --   HGB 12.9 10.6* 10.3* 10.5* 11.9*  HCT 38.0 33.7* 33.1* 32.5* 37.8  MCV  --  84.5 84.4 83.5 84.6  PLT  --  251 238 234 923   Basic Metabolic Panel: Recent Labs  Lab 01/25/17 0757 01/27/17 1233 01/27/17 1654 01/28/17 0649 01/29/17 0634  NA 139 138  --  138 139  K 3.9 3.9  --  4.2 3.9  CL 97* 95*  --  97* 99*  CO2  --  23  --  22 21*  GLUCOSE 97 185*  --  245* 113*  BUN 22* 18  --  22* 24*  CREATININE 4.10* 3.80* 3.53* 4.00* 4.50*  CALCIUM  --  8.0*  --  8.0* 8.4*  PHOS  --   --   --   --  4.7*   GFR: Estimated Creatinine Clearance: 10.7 mL/min (A) (by C-G formula based on SCr of 4.5 mg/dL (H)). Liver Function Tests: Recent Labs  Lab 01/27/17 1233 01/29/17 0634  AST 18  --   ALT 10*  --   ALKPHOS 141*  --   BILITOT 0.9  --   PROT 6.8  --   ALBUMIN 2.7* 2.4*   No results for input(s): LIPASE, AMYLASE in the last 168 hours. No results for input(s): AMMONIA in the last 168 hours. Coagulation Profile: No results for input(s): INR, PROTIME in the last 168 hours. Cardiac Enzymes: Recent Labs  Lab 01/27/17 1654 01/27/17 2308 01/28/17 0649  TROPONINI 0.08* 0.13* 0.15*   BNP (last 3 results) No results for input(s): PROBNP in the last 8760 hours. HbA1C: Recent Labs    01/27/17 1309  HGBA1C 8.4*   CBG: Recent Labs  Lab 01/28/17 2304 01/29/17 0758 01/29/17 1158 01/29/17 1705 01/29/17 2142  GLUCAP 166* 94 143* 101* 134*   Lipid Profile: No results for input(s): CHOL, HDL, LDLCALC, TRIG,  CHOLHDL, LDLDIRECT in the last 72 hours. Thyroid Function Tests: Recent Labs    01/28/17 0016  TSH 0.957  FREET4 0.96   Anemia Panel: No results for input(s): VITAMINB12, FOLATE, FERRITIN, TIBC, IRON, RETICCTPCT in the last 72 hours. Sepsis Labs: Recent Labs  Lab 01/27/17 1659 01/27/17 2037 01/29/17 0827 01/30/17 0409  LATICACIDVEN 4.86* 4.0* 6.2* 7.7*    Recent Results (from the past 240 hour(s))  Urine Culture     Status: Abnormal   Collection Time: 01/22/17  2:28 PM  Result Value Ref Range Status   Specimen Description URINE, RANDOM  Final   Special Requests NONE  Final   Culture MULTIPLE SPECIES PRESENT, SUGGEST RECOLLECTION (A)  Final  Report Status 01/23/2017 FINAL  Final  Blood Culture (routine x 2)     Status: None (Preliminary result)   Collection Time: 01/27/17 12:45 PM  Result Value Ref Range Status   Specimen Description BLOOD RIGHT ARM  Final   Special Requests IN PEDIATRIC BOTTLE Blood Culture adequate volume  Final   Culture NO GROWTH 3 DAYS  Final   Report Status PENDING  Incomplete  Blood Culture (routine x 2)     Status: None (Preliminary result)   Collection Time: 01/27/17  1:00 PM  Result Value Ref Range Status   Specimen Description BLOOD RIGHT HAND  Final   Special Requests   Final    BOTTLES DRAWN AEROBIC AND ANAEROBIC Blood Culture adequate volume   Culture NO GROWTH 3 DAYS  Final   Report Status PENDING  Incomplete         Radiology Studies: No results found.      Scheduled Meds: . aspirin EC  81 mg Oral Q T,Th,S,Su  . calcium acetate  2,001 mg Oral TID WC  . dorzolamide-timolol  1 drop Left Eye Daily  . heparin  5,000 Units Subcutaneous Q8H  . insulin aspart  0-5 Units Subcutaneous QHS  . insulin aspart  0-9 Units Subcutaneous TID WC  . midodrine  10 mg Oral BID WC  . simvastatin  10 mg Oral Daily   Continuous Infusions: . sodium chloride    . sodium chloride    . piperacillin-tazobactam (ZOSYN)  IV Stopped (01/30/17  0230)  . Vancomycin    . vancomycin Stopped (01/30/17 0802)  . vancomycin       LOS: 2 days    Anahis Furgeson Tanna Furry, MD [facility] Pager [phone]  If 7PM-7AM, please contact night-coverage [email or web address] Password [facility] 01/30/2017, 11:36 AM

## 2017-01-30 NOTE — Telephone Encounter (Signed)
Copied from [place]. Topic: Quick Communication - See Telephone Encounter >> Jan 30, 2017  4:36 PM Cleaster Corin, NT wrote: CRM for notification. See Telephone encounter for:   01/30/17. Pt. Is Currently in hospital will be discharged tomorrow 01-31-17 and wanted to know if Dr. Alain Marion will be the intending Dr. Rutherford Limerick and pallliative  care of [facility] can be reached at [phone]

## 2017-01-30 NOTE — Progress Notes (Signed)
[facility]  Received request from [facility] for family interest in [facility] services at home after discharge. Chart reviewed and spoke with daughter [name] by phone. Eligibility is pending at this time.   Answered questions and explained hospice philosophy, services and team approach to care. Tonya verbalized good understanding of information given. Per RNCM, possible discharge tomorrow following final dialysis treatment.   DME needs discussed and discharge address confirmed and correct in chart. DME ordered includes hospital bed with split rails and OBT. [name] is contact to coordinate DME delivery to home with [facility].  [facility] referral center aware of above and will schedule admission visit.   Please send signed and completed out of facility DNR home with patient.  Please send scripts for any medication patient does not already have including comfort medications.  Please send discharge summary to [phone].  Please call with hospice related questions.   [facility] hospital liaison to follow up with [facility] and family tomorrow.   Thank you,  Erling Conte, LCSW [phone]

## 2017-01-30 NOTE — Progress Notes (Signed)
PT Cancellation Note  Patient Details Name: [name] MRN: [id number] DOB: [birth year]   Cancelled Treatment:    Reason Eval/Treat Not Completed: Fatigue/lethargy limiting ability to participate. Pt declining participation in PT eval, stating over and over "[name], I'm tired. Leave me alone."  PT to re-attempt as time allows.    Lorriane Shire 01/30/2017, 1:21 PM

## 2017-01-30 NOTE — Progress Notes (Signed)
Pharmacy Antibiotic Note  [name] is a 72 y.o. female admitted on 01/27/2017 with nausea/vomiting and generally "not feeling well." Patient recently admitted for a right foot ulcer she had since December. She received oral antibiotics for this ulcer and has completed the course of prescribed antibiotics outpatient. Pharmacy has been consulted for Vancomycin + Zosyn dosing for r/o sepsis.   The patient is noted to be receiving HD off-schedule today (Tues, 1/22) - normal HD days are MWF. Wil follow-up to see if the patient will remain off-schedule this week or dialyze again 1/23 to get back on schedule. Renal yet to see today.  Plan: 1. Vancomycin 750 mg x 1 dose after off-schedule HD Tues 1/22 2. Continue Vancomycin 750 mg/HD - will f/u on HD schedule this week 3. Continue Zosyn 3.375g IV every 12 hours (infused over 4 hours) 4. Will continue to follow HD schedule/duration, culture results, LOT, and antibiotic de-escalation plans   Weight: 174 lb 2.6 oz (79 kg)  Temp (24hrs), Avg:98 F (36.7 C), Min:97.7 F (36.5 C), Max:98.2 F (36.8 C)  Recent Labs  Lab 01/25/17 0757 01/27/17 1233 01/27/17 1318 01/27/17 1654 01/27/17 1659 01/27/17 2037 01/28/17 0649 01/29/17 0634 01/29/17 0827 01/30/17 0409  WBC  --  8.8  --   --   --   --  6.0 7.3  --  8.8  CREATININE 4.10* 3.80*  --  3.53*  --   --  4.00* 4.50*  --   --   LATICACIDVEN  --   --  6.06*  --  4.86* 4.0*  --   --  6.2* 7.7*    Estimated Creatinine Clearance: 10.7 mL/min (A) (by C-G formula based on SCr of 4.5 mg/dL (H)).    Allergies  Allergen Reactions  . Ace Inhibitors Cough  . Eggs Or Egg-Derived Products Nausea And Vomiting  . Enalapril Cough  . Lisinopril Cough  . Omnipaque [Iohexol] Hives    Antimicrobials this admission: Zosyn 1/19>>  Vanc 1/19>>  Dose adjustments this admission: n/a  Microbiology results: 1/19 Bcx>> ngtd  Thank you for allowing pharmacy to be a part of this patient's  care.  Alycia Rossetti, PharmD, BCPS Clinical Pharmacist Pager: [phone] Clinical phone for 01/30/2017 from 7a-3:30p: [phone] If after 3:30p, please call main pharmacy at: [phone] 01/30/2017 11:21 AM

## 2017-01-30 NOTE — Progress Notes (Signed)
[facility] Progress Note  Subjective: on HD no c/o  Vitals:   01/30/17 1130 01/30/17 1200 01/30/17 1230 01/30/17 1326  BP: (!) 111/43 (!) 102/50 (!) 113/50 (!) 103/40  Pulse: 80 80 82 72  Resp: 16 16 18 18   Temp:   98 F (36.7 C) 97.9 F (36.6 C)  TempSrc:   Oral Oral  SpO2:   100% 98%  Weight:   78 kg (171 lb 15.3 oz)     Inpatient medications: . aspirin EC  81 mg Oral Q T,Th,S,Su  . calcium acetate  2,001 mg Oral TID WC  . dorzolamide-timolol  1 drop Left Eye Daily  . heparin  5,000 Units Subcutaneous Q8H  . insulin aspart  0-5 Units Subcutaneous QHS  . insulin aspart  0-9 Units Subcutaneous TID WC  . midodrine  10 mg Oral BID WC  . simvastatin  10 mg Oral Daily   . piperacillin-tazobactam (ZOSYN)  IV Stopped (01/30/17 0230)  . Vancomycin    . vancomycin Stopped (01/30/17 0802)   acetaminophen **OR** acetaminophen, albuterol, calcitRIOL, calcium acetate, ondansetron **OR** ondansetron (ZOFRAN) IV, oxyCODONE, senna-docusate  Exam: Obese, AAF, no distress, chron ill appearing No jvd Chest CTAB  Cor RRR 2/6 sem Abd obese, urostomy/ colostomy, nontender, no ascites Ext R TMA, no LE edema NF, responsive LUA AVF  Dialysis: [facility]WF [facility]   4h   400/800  72.5kg   3K/2Ca   LUA AVF  Hep none -mircera 18mcg IV q 2 wks - last 1/16 -calcitriol 26mcg PO qHD TIW        Impression: 1  AMS 2  Hypotension 3  ESRD missed hd yest 4  Vol stable on exam, wts off 5  Anemia  6  Chron foot ulcer 7  DM   Plan - HD today + tomorrow, UF as Ronnie Derby MD [facility] pager [phone]   01/30/2017, 2:01 PM   Recent Labs  Lab 01/27/17 1233 01/27/17 1654 01/28/17 0649 01/29/17 0634  NA 138  --  138 139  K 3.9  --  4.2 3.9  CL 95*  --  97* 99*  CO2 23  --  22 21*  GLUCOSE 185*  --  245* 113*  BUN 18  --  22* 24*  CREATININE 3.80* 3.53* 4.00* 4.50*  CALCIUM 8.0*  --  8.0* 8.4*  PHOS  --   --   --  4.7*   Recent Labs  Lab  01/27/17 1233 01/29/17 0634  AST 18  --   ALT 10*  --   ALKPHOS 141*  --   BILITOT 0.9  --   PROT 6.8  --   ALBUMIN 2.7* 2.4*   Recent Labs  Lab 01/27/17 1233 01/28/17 0649 01/29/17 0634 01/30/17 0409  WBC 8.8 6.0 7.3 8.8  NEUTROABS 7.6  --   --   --   HGB 10.6* 10.3* 10.5* 11.9*  HCT 33.7* 33.1* 32.5* 37.8  MCV 84.5 84.4 83.5 84.6  PLT 251 238 234 276   Iron/TIBC/Ferritin/ %Sat    Component Value Date/Time   IRON 16 (L) 05/21/2011 0715   TIBC 122 (L) 05/21/2011 0715   FERRITIN 2,174 (H) 05/21/2011 0715   IRONPCTSAT 13 (L) 05/21/2011 0715

## 2017-01-30 NOTE — Progress Notes (Signed)
OT Cancellation    01/30/17 0900  OT Visit Information  Last OT Received On 01/30/17  Reason Eval/Treat Not Completed Other (comment) (Pt in HD. will attempt later. )  Maurie Boettcher, OT/L  [phone] 01/30/2017

## 2017-01-30 NOTE — Progress Notes (Signed)
PT Cancellation Note  Patient Details Name: DILANA MCPHIE MRN: [id number] DOB: 04-Feb-1945   Cancelled Treatment:    Reason Eval/Treat Not Completed: Patient at procedure or test/unavailable. Pt in HD. PT to re-attempt eval as time allows.   Lorriane Shire 01/30/2017, 9:53 AM

## 2017-01-31 DIAGNOSIS — R627 Adult failure to thrive: Secondary | ICD-10-CM

## 2017-01-31 DIAGNOSIS — Z66 Do not resuscitate: Secondary | ICD-10-CM

## 2017-01-31 LAB — GLUCOSE, CAPILLARY
GLUCOSE-CAPILLARY: 82 mg/dL (ref 65–99)
GLUCOSE-CAPILLARY: 88 mg/dL (ref 65–99)
Glucose-Capillary: 88 mg/dL (ref 65–99)

## 2017-01-31 MED ORDER — AMOXICILLIN-POT CLAVULANATE 250-125 MG PO TABS
1.0000 | ORAL_TABLET | Freq: Every day | ORAL | Status: DC
  Filled 2017-01-31: qty 1

## 2017-01-31 MED ORDER — OXYCODONE HCL 5 MG PO CAPS: 5.0000 mg | ORAL_CAPSULE | Freq: Four times a day (QID) | ORAL | 0 refills | Status: AC | PRN

## 2017-01-31 MED ORDER — AMOXICILLIN-POT CLAVULANATE 250-125 MG PO TABS: 1.0000 | ORAL_TABLET | Freq: Every day | ORAL | 0 refills | Status: AC

## 2017-01-31 NOTE — Telephone Encounter (Signed)
I will be. Thx

## 2017-01-31 NOTE — Progress Notes (Signed)
[facility] Progress Note   Subjective:   Seen and examined at bedside. Family not present. Laying in bed. Answering some questions. She has decided to withdrawal from dialysis.  Objective Vitals:   01/30/17 2027 01/31/17 0544 01/31/17 0800 01/31/17 0921  BP: (!) 108/41 (!) 102/42  (!) 110/54  Pulse: 80 75  78  Resp: 19 16  18   Temp: 98 F (36.7 C) 98.2 F (36.8 C)  98 F (36.7 C)  TempSrc: Oral Oral  Oral  SpO2: 99% 100%  98%  Weight: 78 kg (171 lb 15.3 oz)     Height:   5' (1.524 m)    Physical Exam General:NAD, chronically ill appearing, pleasant female.  Heart:RRR Lungs:CTAB Abdomen:soft, NT Lower Extremities:no edema, R TMA Dialysis Access: LU AVF   Filed Weights   01/30/17 0825 01/30/17 1230 01/30/17 2027  Weight: 79 kg (174 lb 2.6 oz) 78 kg (171 lb 15.3 oz) 78 kg (171 lb 15.3 oz)    Intake/Output Summary (Last 24 hours) at 01/31/2017 1209 Last data filed at 01/31/2017 0317 Gross per 24 hour  Intake 53 ml  Output 1160 ml  Net -1107 ml    Additional Objective Labs: Basic Metabolic Panel: Recent Labs  Lab 01/27/17 1233 01/27/17 1654 01/28/17 0649 01/29/17 0634  NA 138  --  138 139  K 3.9  --  4.2 3.9  CL 95*  --  97* 99*  CO2 23  --  22 21*  GLUCOSE 185*  --  245* 113*  BUN 18  --  22* 24*  CREATININE 3.80* 3.53* 4.00* 4.50*  CALCIUM 8.0*  --  8.0* 8.4*  PHOS  --   --   --  4.7*   Liver Function Tests: Recent Labs  Lab 01/27/17 1233 01/29/17 0634  AST 18  --   ALT 10*  --   ALKPHOS 141*  --   BILITOT 0.9  --   PROT 6.8  --   ALBUMIN 2.7* 2.4*   CBC: Recent Labs  Lab 01/27/17 1233 01/28/17 0649 01/29/17 0634 01/30/17 0409  WBC 8.8 6.0 7.3 8.8  NEUTROABS 7.6  --   --   --   HGB 10.6* 10.3* 10.5* 11.9*  HCT 33.7* 33.1* 32.5* 37.8  MCV 84.5 84.4 83.5 84.6  PLT 251 238 234 276   Blood Culture    Component Value Date/Time   SDES BLOOD RIGHT HAND 01/27/2017 1300   SPECREQUEST  01/27/2017 1300    BOTTLES DRAWN  AEROBIC AND ANAEROBIC Blood Culture adequate volume   CULT NO GROWTH 4 DAYS 01/27/2017 1300   REPTSTATUS PENDING 01/27/2017 1300    Cardiac Enzymes: Recent Labs  Lab 01/27/17 1654 01/27/17 2308 01/28/17 0649  TROPONINI 0.08* 0.13* 0.15*   CBG: Recent Labs  Lab 01/30/17 1628 01/30/17 1704 01/30/17 2032 01/31/17 0755 01/31/17 1204  GLUCAP 54* 70 95 82 88    Lab Results  Component Value Date   INR 1.23 01/07/2017   INR 1.13 08/18/2016   INR 1.19 10/24/2014   Studies/Results: No results found.  Medications:  . amoxicillin-clavulanate  1 tablet Oral QHS  . aspirin EC  81 mg Oral Q T,Th,S,Su  . dorzolamide-timolol  1 drop Left Eye Daily  . insulin aspart  0-5 Units Subcutaneous QHS  . insulin aspart  0-9 Units Subcutaneous TID WC  . midodrine  10 mg Oral BID WC    Dialysis Orders: [facility]WF [facility]   4h   400/800  72.5kg   3K/2Ca  LUA AVF  Hep none -mircera 52mcg IV q 2 wks - last 1/16 -calcitriol 44mcg PO qHD TIW   Assessment/Plan: 1. AMS - transitioning to comfort care/hospice 2. ESRD - signing off dialysis, will contact outpatient center to inform them of her decision 3. Anemia of CKD 4. Secondary hyperparathyroidism  5. HTN/volume   6. Nutrition  7. Chronic foot ulcer 8. DM 9. Dispo - d/c to Hospice, full comfort care  Jen Mow, PA-C [facility] Pager: [phone] 01/31/2017,12:09 PM  LOS: 3 days   Pt seen, examined and agree w A/P as above.  Kelly Splinter MD [employer] pager [phone]   01/31/2017, 3:31 PM

## 2017-01-31 NOTE — Progress Notes (Addendum)
[facility] Liaison:  RN  Received request from [place], [facility],  for family interest in [facility] services at home after discharge. Chart reviewed and Harmon Pier spoke with daughter, [name] by phone. Eligibility is approved.   Harmon Pier answered questions and explained hospice philosophy, services and team approach to care. Tonya verbalized good understanding of information given. Per CMRN, possible discharge today following final dialysis treatment.   DME needs discussed and discharge address confirmed and correct in chart. DME ordered includes hospital bed with split rails and OBT. [name] is contact to coordinate DME delivery to home with [facility].  Per [facility], delivery not set up yet, I have follow up with [facility] manager and she will ensure order is in with [facility].  [name] advised best contact number is [phone].  [facility] referral center aware of above and will schedule admission visit.   Please send signed and completed out of facility DNR home with patient.  Please send scripts for any medication patient does not already have including comfort medications.  Please send discharge summary to [phone].  Please call with hospice related questions.    Thank you,  Edyth Gunnels, RN, BSN [facility] Liaison [phone]  All hospital liaisons are now on [place].

## 2017-01-31 NOTE — Progress Notes (Signed)
Patient Discharge: Disposition: Patient discharge to home via [facility] when notified by the daughter that hospital bed was arrived.  All belongings were sent with the patient. IV: Discontinued IV before discharge.  Telemetry: Discontinued before discharge, CCMD notified.

## 2017-01-31 NOTE — Progress Notes (Signed)
Spoke w [name] form [facility] who states hospital bed should arrive between 3p-7p. First visit for home scheduled for 8pm.  Spoke w Ulice Dash RN at bedside who will call patient's daughter at 6:00pm to make sure bed has arrived and call [facility] for transportation.

## 2017-01-31 NOTE — Telephone Encounter (Signed)
[name]  Called back today to check on that status of this request as the pt is going to be discharged soon.  Please call her back at [phone]

## 2017-01-31 NOTE — Progress Notes (Signed)
Spoke w Amy from [facility]. Updated that HD planned for around 2:00pm today. Amy verified that equipment ordered and family aware that plan is for DC today after HD. Patient will need [facility] transport. Papers will be printed and placed on chart.

## 2017-01-31 NOTE — Discharge Summary (Signed)
Physician Discharge Summary  [name] EHU:[id number] DOB: Feb 25, 1945 DOA: 01/27/2017  PCP: Cassandria Anger, MD  Admit date: 01/27/2017 Discharge date: 01/31/2017  Admitted From:home Disposition:home hospice  Recommendations for Outpatient Follow-up:  1. Follow up with hospice  Home Health:hospice Equipment/Devices:none Discharge Condition:hospoce CODE STATUS:DNR Diet recommendation:regular  Brief/Interim Summary: 72 year old female with history of ESRD on hemodialysis Monday Wednesday Friday, cardiomyopathy, a stroke, chronic systolic CHF with EF of 78%, diabetes, colon cancer status post colostomy, PEA arrest during EGD in August 2018, recent hospitalization for suspected urosepsis, chronic right foot ulcer presented to the ER with nausea vomiting, hypotension to 60s.  In the ER, patient's blood pressure was systolic 588 and oxygen saturation 79% in room air.  Admitted for further evaluation.  The patient with elevated lactic acid level more than 7 likely in the setting of nausea vomiting and there was concern for possible sepsis, unspecified.  She has chronic right foot ulcer, not a candidate for surgical intervention.  Patient with poor cardiac function, ESRD and multiple hospitalization.  Treated with IV antibiotics.  Cultures negative.  Evaluated by palliative care.  Patient is DNR/DNI.  Now mostly comfort care and going to home with hospice.  No further hemodialysis as per patient, palliative care and family.  Informed nephrology team.  Continue home medication and follow-up with home hospice.  #Elevated lactic acid, hypotension in the setting of nausea vomiting, concern for sepsis:  #Nausea vomiting, non-intractable:  #Acute respiratory failure for with hypoxia on admission: #ESRD on hemodialysis:  #Chronic systolic congestive heart failure:  #History of coronary artery disease:  #Chronic right foot ulcer:  #Type 2 diabetes with hyperglycemia: # Goals of care  discussion  Discharge Diagnoses:  Principal Problem:   Elevated lactic acid level Active Problems:   Foot ulcer (HCC)   Coronary artery disease   Anemia   Obesity   Ischemic cardiomyopathy   Diabetes ([place])   Chronic systolic CHF (congestive heart failure) (HCC)   End-stage renal disease on hemodialysis ([place])   Hypotension   Deviated trachea   DNR (do not resuscitate)   Palliative care by specialist   Adult failure to thrive    Discharge Instructions  Discharge Instructions    Diet general   Complete by:  As directed    Increase activity slowly   Complete by:  As directed      Allergies as of 01/31/2017      Reactions   Ace Inhibitors Cough   Eggs Or Egg-derived Products Nausea And Vomiting   Enalapril Cough   Lisinopril Cough   Omnipaque [iohexol] Hives      Medication List    STOP taking these medications   metoprolol tartrate 50 MG tablet Commonly known as:  LOPRESSOR     TAKE these medications   acetaminophen 500 MG tablet Commonly known as:  TYLENOL Take 500-1,000 mg by mouth every 6 (six) hours as needed (pain).   ALKA-SELTZER ANTACID PO Take 2 tablets by mouth daily as needed (for indigestion).   amoxicillin-clavulanate 250-125 MG tablet Commonly known as:  AUGMENTIN Take 1 tablet by mouth at bedtime for 5 days.   aspirin 81 MG tablet Take 1 tablet (81 mg total) by mouth daily.   calcium acetate 667 MG capsule Commonly known as:  PHOSLO Take 667-2,668 mg by mouth See admin instructions. Take 4 capsules (2668 mg) by mouth three times daily with meals and 1 capsule (667 mg) with snacks   dorzolamide-timolol 22.3-6.8 MG/ML ophthalmic solution Commonly known as:  COSOPT Place 1 drop into the left eye daily.   glucose blood test strip Commonly known as:  ONE TOUCH ULTRA TEST 1 each by Other route daily. And lancets 1/day   lidocaine-prilocaine cream Commonly known as:  EMLA Apply 1 application topically as needed (topical anesthesia for  hemodialysis if Gebauers and Lidocaine injection are ineffective.).   nitroGLYCERIN 0.4 MG SL tablet Commonly known as:  NITROSTAT Place 1 tablet (0.4 mg total) under the tongue every 5 (five) minutes as needed for chest pain.   ondansetron 4 MG tablet Commonly known as:  ZOFRAN Take 1 tablet (4 mg total) by mouth every 6 (six) hours as needed for nausea.   oxycodone 5 MG capsule Commonly known as:  OXY-IR Take 1 capsule (5 mg total) by mouth every 6 (six) hours as needed for pain.   simvastatin 10 MG tablet Commonly known as:  ZOCOR Take 1 tablet (10 mg total) by mouth every evening. What changed:  when to take this      Follow-up Information    Plotnikov, Evie Lacks, MD. Schedule an appointment as soon as possible for a visit in 1 week(s).   Specialty:  Internal Medicine Contact information: [place] [phone]          Allergies  Allergen Reactions  . Ace Inhibitors Cough  . Eggs Or Egg-Derived Products Nausea And Vomiting  . Enalapril Cough  . Lisinopril Cough  . Omnipaque [Iohexol] Hives    Consultations: Palliative care Nephrology  Procedures/Studies: None  Subjective: Seen and examined at bedside.  No nausea vomiting or abdominal pain.  Tolerating diet well.  Understands that she is going home with hospice.  Discharge Exam: Vitals:   01/31/17 0544 01/31/17 0921  BP: (!) 102/42 (!) 110/54  Pulse: 75 78  Resp: 16 18  Temp: 98.2 F (36.8 C) 98 F (36.7 C)  SpO2: 100% 98%   Vitals:   01/30/17 2027 01/31/17 0544 01/31/17 0800 01/31/17 0921  BP: (!) 108/41 (!) 102/42  (!) 110/54  Pulse: 80 75  78  Resp: 19 16  18   Temp: 98 F (36.7 C) 98.2 F (36.8 C)  98 F (36.7 C)  TempSrc: Oral Oral  Oral  SpO2: 99% 100%  98%  Weight: 78 kg (171 lb 15.3 oz)     Height:   5' (1.524 m)     General: Pt is alert, awake, not in acute distress Cardiovascular: RRR, S1/S2 +, no rubs, no gallops Respiratory: CTA bilaterally, no  wheezing, no rhonchi Abdominal: Soft, NT, ND, bowel sounds +, has ostomies. Extremities: no edema, chronic right foot wound.    The results of significant diagnostics from this hospitalization (including imaging, microbiology, ancillary and laboratory) are listed below for reference.     Microbiology: Recent Results (from the past 240 hour(s))  Urine Culture     Status: Abnormal   Collection Time: 01/22/17  2:28 PM  Result Value Ref Range Status   Specimen Description URINE, RANDOM  Final   Special Requests NONE  Final   Culture MULTIPLE SPECIES PRESENT, SUGGEST RECOLLECTION (A)  Final   Report Status 01/23/2017 FINAL  Final  Blood Culture (routine x 2)     Status: None (Preliminary result)   Collection Time: 01/27/17 12:45 PM  Result Value Ref Range Status   Specimen Description BLOOD RIGHT ARM  Final   Special Requests IN PEDIATRIC BOTTLE Blood Culture adequate volume  Final   Culture NO GROWTH 4 DAYS  Final   Report Status PENDING  Incomplete  Blood Culture (routine x 2)     Status: None (Preliminary result)   Collection Time: 01/27/17  1:00 PM  Result Value Ref Range Status   Specimen Description BLOOD RIGHT HAND  Final   Special Requests   Final    BOTTLES DRAWN AEROBIC AND ANAEROBIC Blood Culture adequate volume   Culture NO GROWTH 4 DAYS  Final   Report Status PENDING  Incomplete     Labs: BNP (last 3 results) Recent Labs    07/23/16 1640  BNP >3,086.5*   Basic Metabolic Panel: Recent Labs  Lab 01/25/17 0757 01/27/17 1233 01/27/17 1654 01/28/17 0649 01/29/17 0634  NA 139 138  --  138 139  K 3.9 3.9  --  4.2 3.9  CL 97* 95*  --  97* 99*  CO2  --  23  --  22 21*  GLUCOSE 97 185*  --  245* 113*  BUN 22* 18  --  22* 24*  CREATININE 4.10* 3.80* 3.53* 4.00* 4.50*  CALCIUM  --  8.0*  --  8.0* 8.4*  PHOS  --   --   --   --  4.7*   Liver Function Tests: Recent Labs  Lab 01/27/17 1233 01/29/17 0634  AST 18  --   ALT 10*  --   ALKPHOS 141*  --   BILITOT  0.9  --   PROT 6.8  --   ALBUMIN 2.7* 2.4*   No results for input(s): LIPASE, AMYLASE in the last 168 hours. No results for input(s): AMMONIA in the last 168 hours. CBC: Recent Labs  Lab 01/25/17 0757 01/27/17 1233 01/28/17 0649 01/29/17 0634 01/30/17 0409  WBC  --  8.8 6.0 7.3 8.8  NEUTROABS  --  7.6  --   --   --   HGB 12.9 10.6* 10.3* 10.5* 11.9*  HCT 38.0 33.7* 33.1* 32.5* 37.8  MCV  --  84.5 84.4 83.5 84.6  PLT  --  251 238 234 276   Cardiac Enzymes: Recent Labs  Lab 01/27/17 1654 01/27/17 2308 01/28/17 0649  TROPONINI 0.08* 0.13* 0.15*   BNP: Invalid input(s): POCBNP CBG: Recent Labs  Lab 01/30/17 1321 01/30/17 1628 01/30/17 1704 01/30/17 2032 01/31/17 0755  GLUCAP 72 54* 70 95 82   D-Dimer No results for input(s): DDIMER in the last 72 hours. Hgb A1c No results for input(s): HGBA1C in the last 72 hours. Lipid Profile No results for input(s): CHOL, HDL, LDLCALC, TRIG, CHOLHDL, LDLDIRECT in the last 72 hours. Thyroid function studies No results for input(s): TSH, T4TOTAL, T3FREE, THYROIDAB in the last 72 hours.  Invalid input(s): FREET3 Anemia work up No results for input(s): VITAMINB12, FOLATE, FERRITIN, TIBC, IRON, RETICCTPCT in the last 72 hours. Urinalysis    Component Value Date/Time   COLORURINE YELLOW 01/27/2017 1208   APPEARANCEUR TURBID (A) 01/27/2017 1208   LABSPEC 1.012 01/27/2017 1208   PHURINE 8.0 01/27/2017 1208   GLUCOSEU NEGATIVE 01/27/2017 1208   HGBUR SMALL (A) 01/27/2017 1208   BILIRUBINUR NEGATIVE 01/27/2017 1208   KETONESUR NEGATIVE 01/27/2017 1208   PROTEINUR 100 (A) 01/27/2017 1208   UROBILINOGEN 4.0 (H) 05/20/2011 2219   NITRITE NEGATIVE 01/27/2017 1208   LEUKOCYTESUR LARGE (A) 01/27/2017 1208   Sepsis Labs Invalid input(s): PROCALCITONIN,  WBC,  LACTICIDVEN Microbiology Recent Results (from the past 240 hour(s))  Urine Culture     Status: Abnormal   Collection Time: 01/22/17  2:28 PM  Result Value Ref  Range  Status   Specimen Description URINE, RANDOM  Final   Special Requests NONE  Final   Culture MULTIPLE SPECIES PRESENT, SUGGEST RECOLLECTION (A)  Final   Report Status 01/23/2017 FINAL  Final  Blood Culture (routine x 2)     Status: None (Preliminary result)   Collection Time: 01/27/17 12:45 PM  Result Value Ref Range Status   Specimen Description BLOOD RIGHT ARM  Final   Special Requests IN PEDIATRIC BOTTLE Blood Culture adequate volume  Final   Culture NO GROWTH 4 DAYS  Final   Report Status PENDING  Incomplete  Blood Culture (routine x 2)     Status: None (Preliminary result)   Collection Time: 01/27/17  1:00 PM  Result Value Ref Range Status   Specimen Description BLOOD RIGHT HAND  Final   Special Requests   Final    BOTTLES DRAWN AEROBIC AND ANAEROBIC Blood Culture adequate volume   Culture NO GROWTH 4 DAYS  Final   Report Status PENDING  Incomplete     Time coordinating discharge: 28 minutes  SIGNED:   Rosita Fire, MD  [facility] 01/31/2017, 10:41 AM  If 7PM-7AM, please contact night-coverage [email or web address] Password [email or web address]

## 2017-02-01 ENCOUNTER — Telehealth: Payer: Self-pay | Admitting: Internal Medicine

## 2017-02-01 ENCOUNTER — Encounter: Payer: Self-pay | Admitting: *Deleted

## 2017-02-01 ENCOUNTER — Other Ambulatory Visit: Payer: Self-pay | Admitting: Licensed Clinical Social Worker

## 2017-02-01 ENCOUNTER — Other Ambulatory Visit: Payer: Self-pay | Admitting: *Deleted

## 2017-02-01 DIAGNOSIS — I10 Essential (primary) hypertension: Secondary | ICD-10-CM | POA: Diagnosis not present

## 2017-02-01 DIAGNOSIS — N186 End stage renal disease: Secondary | ICD-10-CM | POA: Diagnosis not present

## 2017-02-01 DIAGNOSIS — N390 Urinary tract infection, site not specified: Secondary | ICD-10-CM | POA: Diagnosis not present

## 2017-02-01 DIAGNOSIS — I70209 Unspecified atherosclerosis of native arteries of extremities, unspecified extremity: Secondary | ICD-10-CM | POA: Diagnosis not present

## 2017-02-01 DIAGNOSIS — I083 Combined rheumatic disorders of mitral, aortic and tricuspid valves: Secondary | ICD-10-CM | POA: Diagnosis not present

## 2017-02-01 DIAGNOSIS — I5020 Unspecified systolic (congestive) heart failure: Secondary | ICD-10-CM | POA: Diagnosis not present

## 2017-02-01 DIAGNOSIS — I25119 Atherosclerotic heart disease of native coronary artery with unspecified angina pectoris: Secondary | ICD-10-CM | POA: Diagnosis not present

## 2017-02-01 LAB — CULTURE, BLOOD (ROUTINE X 2)
CULTURE: NO GROWTH
CULTURE: NO GROWTH
SPECIAL REQUESTS: ADEQUATE
Special Requests: ADEQUATE

## 2017-02-01 NOTE — Patient Outreach (Signed)
[facility]) Care Management  02/01/2017  CALISE DUNCKEL [birth year] [id number]   Assessment- CSW was notified by [facility] RNCM that patient is now fully active with Hospice. CSW will complete discharge at this time and will send discipline closure letter to PCP. CSW will update [facility] RNCM as well.   Eula Fried, BSW, MSW, [email or web address] Phone: [phone] Fax: [phone]

## 2017-02-01 NOTE — Telephone Encounter (Signed)
[name] notified

## 2017-02-01 NOTE — Telephone Encounter (Signed)
Copied from [place] [phone]. Topic: Quick Communication - See Telephone Encounter >> Feb 01, 2017 12:12 PM Arletha Grippe wrote: CRM for notification. See Telephone encounter for:   02/01/17. Pam from hospice called -  We need a nitroglycerin prn order called in. Her current supply has turned to powder. [facility] on [address]. Cb # [phone]

## 2017-02-01 NOTE — Patient Outreach (Signed)
[facility]) Care Management [place] Telephone Outreach, Case Closure  02/01/2017  AKEIA PEROT 05/30/1945 [id number]  Successful telephone outreach to [name], daughter and caregiver on [facility] CM written consent, for Tarin Johndrow [id number] y.o.femalereferred to [facility] after recent hospitalization January 2-5, 2019 for nausea, vomiting, flank pain; patient was diagnosed with sepsis/ pyelonephritis during hospitalization. Patient was discharged home to self-care with home health services in place through [facility] for PT/ OT. Patient has history including, but not limited to,colon cancer with exisiting colostomy, HTN/ HLD, CKD- on HD 3 x week, previous CVA, CAD, combined CHF, Diabetes Type IIwith PVD and transmetatarsal amputation, and GERD.  Patient re-presented to hospital January 27, 2017 for nausea, vomiting, hypotension and hypoxia and was discharged home under full hospice care on January 31, 2017.  HIPAA/ identity verified with patient's daughter [name] during phone call today.  [name] reports today that patient is "doing okay, she is in a good place; she even seems excited to know that she will be in heaven soon; she has been creating her obituary and she is in real good spirits.... She is not sad at all."  [name] further reported that Hospice visited with patient this morning, and "everything is in place."  Reported that patient had hospital bed delivered to and set up in their home yesterday.  [name] denies further needs; emotional support provided to [place] throughout phone call today.  Discussed with [facility] CM case closure, now that patient is being actively followed by Hospice, and Tonya verbalized agreement and understanding of case closure.  Plan:  Will close [facility] CM case, as patient is under full hospice at home care; will make patient's PCP and [facility] CMA aware of case closure.  Oneta Rack, RN, BSN, [employer] [facility] Care Management  [phone]

## 2017-02-01 NOTE — Telephone Encounter (Signed)
02/01/17. Pam from hospice called -  We need a nitroglycerin prn order called in. Has scheduled OV tomorrow.

## 2017-02-02 ENCOUNTER — Encounter (HOSPITAL_COMMUNITY): Payer: Medicare Other

## 2017-02-02 ENCOUNTER — Inpatient Hospital Stay: Payer: Medicare Other | Admitting: Internal Medicine

## 2017-02-02 DIAGNOSIS — I70209 Unspecified atherosclerosis of native arteries of extremities, unspecified extremity: Secondary | ICD-10-CM | POA: Diagnosis not present

## 2017-02-02 DIAGNOSIS — I25119 Atherosclerotic heart disease of native coronary artery with unspecified angina pectoris: Secondary | ICD-10-CM | POA: Diagnosis not present

## 2017-02-02 DIAGNOSIS — Z0289 Encounter for other administrative examinations: Secondary | ICD-10-CM

## 2017-02-02 DIAGNOSIS — I5020 Unspecified systolic (congestive) heart failure: Secondary | ICD-10-CM | POA: Diagnosis not present

## 2017-02-02 DIAGNOSIS — N186 End stage renal disease: Secondary | ICD-10-CM | POA: Diagnosis not present

## 2017-02-02 DIAGNOSIS — I083 Combined rheumatic disorders of mitral, aortic and tricuspid valves: Secondary | ICD-10-CM | POA: Diagnosis not present

## 2017-02-02 DIAGNOSIS — I10 Essential (primary) hypertension: Secondary | ICD-10-CM | POA: Diagnosis not present

## 2017-02-02 MED ORDER — NITROGLYCERIN 0.4 MG SL SUBL: 0.4000 mg | SUBLINGUAL_TABLET | SUBLINGUAL | 3 refills | Status: AC | PRN

## 2017-02-02 NOTE — Telephone Encounter (Signed)
RX sent

## 2017-02-03 DIAGNOSIS — I25119 Atherosclerotic heart disease of native coronary artery with unspecified angina pectoris: Secondary | ICD-10-CM | POA: Diagnosis not present

## 2017-02-03 DIAGNOSIS — N186 End stage renal disease: Secondary | ICD-10-CM | POA: Diagnosis not present

## 2017-02-03 DIAGNOSIS — I70209 Unspecified atherosclerosis of native arteries of extremities, unspecified extremity: Secondary | ICD-10-CM | POA: Diagnosis not present

## 2017-02-03 DIAGNOSIS — I083 Combined rheumatic disorders of mitral, aortic and tricuspid valves: Secondary | ICD-10-CM | POA: Diagnosis not present

## 2017-02-03 DIAGNOSIS — I5020 Unspecified systolic (congestive) heart failure: Secondary | ICD-10-CM | POA: Diagnosis not present

## 2017-02-03 DIAGNOSIS — I10 Essential (primary) hypertension: Secondary | ICD-10-CM | POA: Diagnosis not present

## 2017-02-04 DIAGNOSIS — I25119 Atherosclerotic heart disease of native coronary artery with unspecified angina pectoris: Secondary | ICD-10-CM | POA: Diagnosis not present

## 2017-02-04 DIAGNOSIS — N186 End stage renal disease: Secondary | ICD-10-CM | POA: Diagnosis not present

## 2017-02-04 DIAGNOSIS — I70209 Unspecified atherosclerosis of native arteries of extremities, unspecified extremity: Secondary | ICD-10-CM | POA: Diagnosis not present

## 2017-02-04 DIAGNOSIS — I083 Combined rheumatic disorders of mitral, aortic and tricuspid valves: Secondary | ICD-10-CM | POA: Diagnosis not present

## 2017-02-04 DIAGNOSIS — I10 Essential (primary) hypertension: Secondary | ICD-10-CM | POA: Diagnosis not present

## 2017-02-04 DIAGNOSIS — I5020 Unspecified systolic (congestive) heart failure: Secondary | ICD-10-CM | POA: Diagnosis not present

## 2017-02-05 ENCOUNTER — Encounter (HOSPITAL_COMMUNITY): Payer: Medicare Other

## 2017-02-05 DIAGNOSIS — I25119 Atherosclerotic heart disease of native coronary artery with unspecified angina pectoris: Secondary | ICD-10-CM | POA: Diagnosis not present

## 2017-02-05 DIAGNOSIS — I70209 Unspecified atherosclerosis of native arteries of extremities, unspecified extremity: Secondary | ICD-10-CM | POA: Diagnosis not present

## 2017-02-05 DIAGNOSIS — I5020 Unspecified systolic (congestive) heart failure: Secondary | ICD-10-CM | POA: Diagnosis not present

## 2017-02-05 DIAGNOSIS — I10 Essential (primary) hypertension: Secondary | ICD-10-CM | POA: Diagnosis not present

## 2017-02-05 DIAGNOSIS — N186 End stage renal disease: Secondary | ICD-10-CM | POA: Diagnosis not present

## 2017-02-05 DIAGNOSIS — I083 Combined rheumatic disorders of mitral, aortic and tricuspid valves: Secondary | ICD-10-CM | POA: Diagnosis not present

## 2017-02-06 DIAGNOSIS — I10 Essential (primary) hypertension: Secondary | ICD-10-CM | POA: Diagnosis not present

## 2017-02-06 DIAGNOSIS — I5020 Unspecified systolic (congestive) heart failure: Secondary | ICD-10-CM | POA: Diagnosis not present

## 2017-02-06 DIAGNOSIS — N186 End stage renal disease: Secondary | ICD-10-CM | POA: Diagnosis not present

## 2017-02-06 DIAGNOSIS — I25119 Atherosclerotic heart disease of native coronary artery with unspecified angina pectoris: Secondary | ICD-10-CM | POA: Diagnosis not present

## 2017-02-06 DIAGNOSIS — I70209 Unspecified atherosclerosis of native arteries of extremities, unspecified extremity: Secondary | ICD-10-CM | POA: Diagnosis not present

## 2017-02-06 DIAGNOSIS — I083 Combined rheumatic disorders of mitral, aortic and tricuspid valves: Secondary | ICD-10-CM | POA: Diagnosis not present

## 2017-02-07 ENCOUNTER — Ambulatory Visit: Payer: Medicare Other | Admitting: Vascular Surgery

## 2017-02-07 DIAGNOSIS — I5020 Unspecified systolic (congestive) heart failure: Secondary | ICD-10-CM | POA: Diagnosis not present

## 2017-02-07 DIAGNOSIS — N186 End stage renal disease: Secondary | ICD-10-CM | POA: Diagnosis not present

## 2017-02-07 DIAGNOSIS — I083 Combined rheumatic disorders of mitral, aortic and tricuspid valves: Secondary | ICD-10-CM | POA: Diagnosis not present

## 2017-02-07 DIAGNOSIS — I70209 Unspecified atherosclerosis of native arteries of extremities, unspecified extremity: Secondary | ICD-10-CM | POA: Diagnosis not present

## 2017-02-07 DIAGNOSIS — I25119 Atherosclerotic heart disease of native coronary artery with unspecified angina pectoris: Secondary | ICD-10-CM | POA: Diagnosis not present

## 2017-02-07 DIAGNOSIS — I10 Essential (primary) hypertension: Secondary | ICD-10-CM | POA: Diagnosis not present

## 2017-02-08 DIAGNOSIS — I70209 Unspecified atherosclerosis of native arteries of extremities, unspecified extremity: Secondary | ICD-10-CM | POA: Diagnosis not present

## 2017-02-08 DIAGNOSIS — I25119 Atherosclerotic heart disease of native coronary artery with unspecified angina pectoris: Secondary | ICD-10-CM | POA: Diagnosis not present

## 2017-02-08 DIAGNOSIS — I083 Combined rheumatic disorders of mitral, aortic and tricuspid valves: Secondary | ICD-10-CM | POA: Diagnosis not present

## 2017-02-08 DIAGNOSIS — N186 End stage renal disease: Secondary | ICD-10-CM | POA: Diagnosis not present

## 2017-02-08 DIAGNOSIS — I5020 Unspecified systolic (congestive) heart failure: Secondary | ICD-10-CM | POA: Diagnosis not present

## 2017-02-08 DIAGNOSIS — I10 Essential (primary) hypertension: Secondary | ICD-10-CM | POA: Diagnosis not present

## 2017-02-09 DIAGNOSIS — N390 Urinary tract infection, site not specified: Secondary | ICD-10-CM | POA: Diagnosis not present

## 2017-02-09 DIAGNOSIS — I5020 Unspecified systolic (congestive) heart failure: Secondary | ICD-10-CM | POA: Diagnosis not present

## 2017-02-09 DIAGNOSIS — I083 Combined rheumatic disorders of mitral, aortic and tricuspid valves: Secondary | ICD-10-CM | POA: Diagnosis not present

## 2017-02-09 DIAGNOSIS — N186 End stage renal disease: Secondary | ICD-10-CM | POA: Diagnosis not present

## 2017-02-09 DIAGNOSIS — I70209 Unspecified atherosclerosis of native arteries of extremities, unspecified extremity: Secondary | ICD-10-CM | POA: Diagnosis not present

## 2017-02-09 DIAGNOSIS — I10 Essential (primary) hypertension: Secondary | ICD-10-CM | POA: Diagnosis not present

## 2017-02-09 DIAGNOSIS — I25119 Atherosclerotic heart disease of native coronary artery with unspecified angina pectoris: Secondary | ICD-10-CM | POA: Diagnosis not present

## 2017-02-10 DIAGNOSIS — N186 End stage renal disease: Secondary | ICD-10-CM | POA: Diagnosis not present

## 2017-02-10 DIAGNOSIS — I5020 Unspecified systolic (congestive) heart failure: Secondary | ICD-10-CM | POA: Diagnosis not present

## 2017-02-10 DIAGNOSIS — I25119 Atherosclerotic heart disease of native coronary artery with unspecified angina pectoris: Secondary | ICD-10-CM | POA: Diagnosis not present

## 2017-02-10 DIAGNOSIS — I70209 Unspecified atherosclerosis of native arteries of extremities, unspecified extremity: Secondary | ICD-10-CM | POA: Diagnosis not present

## 2017-02-10 DIAGNOSIS — I10 Essential (primary) hypertension: Secondary | ICD-10-CM | POA: Diagnosis not present

## 2017-02-10 DIAGNOSIS — I083 Combined rheumatic disorders of mitral, aortic and tricuspid valves: Secondary | ICD-10-CM | POA: Diagnosis not present

## 2017-02-11 DIAGNOSIS — N186 End stage renal disease: Secondary | ICD-10-CM | POA: Diagnosis not present

## 2017-02-11 DIAGNOSIS — I083 Combined rheumatic disorders of mitral, aortic and tricuspid valves: Secondary | ICD-10-CM | POA: Diagnosis not present

## 2017-02-11 DIAGNOSIS — I70209 Unspecified atherosclerosis of native arteries of extremities, unspecified extremity: Secondary | ICD-10-CM | POA: Diagnosis not present

## 2017-02-11 DIAGNOSIS — I25119 Atherosclerotic heart disease of native coronary artery with unspecified angina pectoris: Secondary | ICD-10-CM | POA: Diagnosis not present

## 2017-02-11 DIAGNOSIS — I5020 Unspecified systolic (congestive) heart failure: Secondary | ICD-10-CM | POA: Diagnosis not present

## 2017-02-11 DIAGNOSIS — I10 Essential (primary) hypertension: Secondary | ICD-10-CM | POA: Diagnosis not present

## 2017-02-12 DIAGNOSIS — N186 End stage renal disease: Secondary | ICD-10-CM | POA: Diagnosis not present

## 2017-02-12 DIAGNOSIS — I083 Combined rheumatic disorders of mitral, aortic and tricuspid valves: Secondary | ICD-10-CM | POA: Diagnosis not present

## 2017-02-12 DIAGNOSIS — I10 Essential (primary) hypertension: Secondary | ICD-10-CM | POA: Diagnosis not present

## 2017-02-12 DIAGNOSIS — I5020 Unspecified systolic (congestive) heart failure: Secondary | ICD-10-CM | POA: Diagnosis not present

## 2017-02-12 DIAGNOSIS — I25119 Atherosclerotic heart disease of native coronary artery with unspecified angina pectoris: Secondary | ICD-10-CM | POA: Diagnosis not present

## 2017-02-12 DIAGNOSIS — I70209 Unspecified atherosclerosis of native arteries of extremities, unspecified extremity: Secondary | ICD-10-CM | POA: Diagnosis not present

## 2017-02-14 ENCOUNTER — Telehealth: Payer: Self-pay

## 2017-02-14 NOTE — Telephone Encounter (Signed)
On 02/14/17 I received a d/c from [place] (original). The d/c is for burial. The patient is a patient of Doctor Plotnikov. The d/c will be taken to [facility] for signature.  On 02/15/17 I received the d/c back from Doctor Plotnikov. I got the d/c ready and called the funeral home to let them know the d/c is ready for pickup.

## 2017-02-27 ENCOUNTER — Ambulatory Visit: Payer: Medicare Other | Admitting: Internal Medicine

## 2017-03-09 DEATH — deceased
